# Patient Record
Sex: Male | Born: 1935 | Race: White | Hispanic: No | Marital: Married | Smoking: Former smoker
Health system: Southern US, Community
[De-identification: ages and names within clinical notes are randomized; demographics above are authoritative.]

## PROBLEM LIST (undated history)

## (undated) DIAGNOSIS — I6529 Occlusion and stenosis of unspecified carotid artery: Secondary | ICD-10-CM

## (undated) DIAGNOSIS — Z87442 Personal history of urinary calculi: Secondary | ICD-10-CM

## (undated) DIAGNOSIS — I219 Acute myocardial infarction, unspecified: Secondary | ICD-10-CM

## (undated) DIAGNOSIS — E039 Hypothyroidism, unspecified: Secondary | ICD-10-CM

## (undated) DIAGNOSIS — Z992 Dependence on renal dialysis: Secondary | ICD-10-CM

## (undated) DIAGNOSIS — H9190 Unspecified hearing loss, unspecified ear: Secondary | ICD-10-CM

## (undated) DIAGNOSIS — E079 Disorder of thyroid, unspecified: Secondary | ICD-10-CM

## (undated) DIAGNOSIS — F329 Major depressive disorder, single episode, unspecified: Secondary | ICD-10-CM

## (undated) DIAGNOSIS — I10 Essential (primary) hypertension: Secondary | ICD-10-CM

## (undated) DIAGNOSIS — C801 Malignant (primary) neoplasm, unspecified: Secondary | ICD-10-CM

## (undated) DIAGNOSIS — T7840XA Allergy, unspecified, initial encounter: Secondary | ICD-10-CM

## (undated) DIAGNOSIS — E785 Hyperlipidemia, unspecified: Secondary | ICD-10-CM

## (undated) DIAGNOSIS — N186 End stage renal disease: Secondary | ICD-10-CM

## (undated) DIAGNOSIS — F32A Depression, unspecified: Secondary | ICD-10-CM

## (undated) DIAGNOSIS — I639 Cerebral infarction, unspecified: Secondary | ICD-10-CM

## (undated) DIAGNOSIS — R0600 Dyspnea, unspecified: Secondary | ICD-10-CM

## (undated) DIAGNOSIS — K219 Gastro-esophageal reflux disease without esophagitis: Secondary | ICD-10-CM

## (undated) DIAGNOSIS — D649 Anemia, unspecified: Secondary | ICD-10-CM

## (undated) HISTORY — DX: Acute myocardial infarction, unspecified: I21.9

## (undated) HISTORY — DX: Essential (primary) hypertension: I10

## (undated) HISTORY — PX: ESOPHAGOSCOPY WITH DILITATION: SHX5618

## (undated) HISTORY — DX: Unspecified hearing loss, unspecified ear: H91.90

## (undated) HISTORY — DX: Gastro-esophageal reflux disease without esophagitis: K21.9

## (undated) HISTORY — DX: Allergy, unspecified, initial encounter: T78.40XA

## (undated) HISTORY — PX: COLONOSCOPY: SHX174

## (undated) HISTORY — DX: Occlusion and stenosis of unspecified carotid artery: I65.29

## (undated) HISTORY — PX: ELBOW SURGERY: SHX618

## (undated) HISTORY — PX: NOSE SURGERY: SHX723

## (undated) HISTORY — DX: Hyperlipidemia, unspecified: E78.5

## (undated) HISTORY — PX: OTHER SURGICAL HISTORY: SHX169

## (undated) HISTORY — DX: Disorder of thyroid, unspecified: E07.9

---

## 1998-05-06 ENCOUNTER — Encounter: Payer: Self-pay | Admitting: Emergency Medicine

## 1998-05-06 ENCOUNTER — Emergency Department (HOSPITAL_COMMUNITY): Admission: EM | Admit: 1998-05-06 | Discharge: 1998-05-06 | Payer: Self-pay | Admitting: Emergency Medicine

## 1998-12-11 ENCOUNTER — Ambulatory Visit (HOSPITAL_COMMUNITY): Admission: RE | Admit: 1998-12-11 | Discharge: 1998-12-11 | Payer: Self-pay | Admitting: Internal Medicine

## 1999-01-08 ENCOUNTER — Ambulatory Visit (HOSPITAL_COMMUNITY): Admission: RE | Admit: 1999-01-08 | Discharge: 1999-01-08 | Payer: Self-pay | Admitting: Cardiology

## 1999-01-23 ENCOUNTER — Encounter (HOSPITAL_COMMUNITY): Admission: RE | Admit: 1999-01-23 | Discharge: 1999-04-23 | Payer: Self-pay | Admitting: Cardiology

## 1999-04-24 ENCOUNTER — Encounter (HOSPITAL_COMMUNITY): Admission: RE | Admit: 1999-04-24 | Discharge: 1999-07-23 | Payer: Self-pay | Admitting: Cardiology

## 1999-06-12 ENCOUNTER — Encounter: Admission: RE | Admit: 1999-06-12 | Discharge: 1999-06-12 | Payer: Self-pay | Admitting: Urology

## 1999-06-12 ENCOUNTER — Encounter: Payer: Self-pay | Admitting: Urology

## 2001-11-02 ENCOUNTER — Emergency Department (HOSPITAL_COMMUNITY): Admission: EM | Admit: 2001-11-02 | Discharge: 2001-11-02 | Payer: Self-pay | Admitting: *Deleted

## 2002-01-11 ENCOUNTER — Encounter: Payer: Self-pay | Admitting: Internal Medicine

## 2006-04-14 ENCOUNTER — Ambulatory Visit: Payer: Self-pay | Admitting: Internal Medicine

## 2006-05-14 ENCOUNTER — Encounter (INDEPENDENT_AMBULATORY_CARE_PROVIDER_SITE_OTHER): Payer: Self-pay | Admitting: *Deleted

## 2006-05-14 ENCOUNTER — Ambulatory Visit: Payer: Self-pay | Admitting: Internal Medicine

## 2007-08-16 ENCOUNTER — Encounter: Admission: RE | Admit: 2007-08-16 | Discharge: 2007-08-16 | Payer: Self-pay | Admitting: Otolaryngology

## 2009-02-25 DIAGNOSIS — I219 Acute myocardial infarction, unspecified: Secondary | ICD-10-CM

## 2009-02-25 HISTORY — DX: Acute myocardial infarction, unspecified: I21.9

## 2009-06-19 ENCOUNTER — Observation Stay (HOSPITAL_COMMUNITY): Admission: EM | Admit: 2009-06-19 | Discharge: 2009-06-20 | Payer: Self-pay | Admitting: Emergency Medicine

## 2009-06-19 ENCOUNTER — Ambulatory Visit: Payer: Self-pay | Admitting: Gastroenterology

## 2009-06-20 ENCOUNTER — Encounter: Payer: Self-pay | Admitting: Gastroenterology

## 2009-06-20 ENCOUNTER — Telehealth (INDEPENDENT_AMBULATORY_CARE_PROVIDER_SITE_OTHER): Payer: Self-pay | Admitting: *Deleted

## 2009-06-21 DIAGNOSIS — K222 Esophageal obstruction: Secondary | ICD-10-CM | POA: Insufficient documentation

## 2009-06-29 ENCOUNTER — Telehealth (INDEPENDENT_AMBULATORY_CARE_PROVIDER_SITE_OTHER): Payer: Self-pay | Admitting: *Deleted

## 2009-06-30 ENCOUNTER — Encounter: Payer: Self-pay | Admitting: Internal Medicine

## 2009-06-30 ENCOUNTER — Ambulatory Visit (HOSPITAL_COMMUNITY): Admission: RE | Admit: 2009-06-30 | Discharge: 2009-06-30 | Payer: Self-pay | Admitting: Internal Medicine

## 2009-07-26 HISTORY — PX: CORONARY ANGIOPLASTY WITH STENT PLACEMENT: SHX49

## 2009-08-16 ENCOUNTER — Telehealth: Payer: Self-pay | Admitting: Internal Medicine

## 2009-08-17 ENCOUNTER — Telehealth: Payer: Self-pay | Admitting: Gastroenterology

## 2009-08-18 ENCOUNTER — Ambulatory Visit: Payer: Self-pay | Admitting: Gastroenterology

## 2009-08-18 ENCOUNTER — Inpatient Hospital Stay (HOSPITAL_COMMUNITY): Admission: EM | Admit: 2009-08-18 | Discharge: 2009-08-22 | Payer: Self-pay | Admitting: Emergency Medicine

## 2009-08-18 DIAGNOSIS — Z87442 Personal history of urinary calculi: Secondary | ICD-10-CM

## 2009-08-18 DIAGNOSIS — Z860100 Personal history of colon polyps, unspecified: Secondary | ICD-10-CM | POA: Insufficient documentation

## 2009-08-18 DIAGNOSIS — D649 Anemia, unspecified: Secondary | ICD-10-CM

## 2009-08-18 DIAGNOSIS — Z794 Long term (current) use of insulin: Secondary | ICD-10-CM

## 2009-08-18 DIAGNOSIS — I110 Hypertensive heart disease with heart failure: Secondary | ICD-10-CM

## 2009-08-18 DIAGNOSIS — J301 Allergic rhinitis due to pollen: Secondary | ICD-10-CM

## 2009-08-18 DIAGNOSIS — K219 Gastro-esophageal reflux disease without esophagitis: Secondary | ICD-10-CM | POA: Insufficient documentation

## 2009-08-18 DIAGNOSIS — K449 Diaphragmatic hernia without obstruction or gangrene: Secondary | ICD-10-CM | POA: Insufficient documentation

## 2009-08-18 DIAGNOSIS — E119 Type 2 diabetes mellitus without complications: Secondary | ICD-10-CM | POA: Insufficient documentation

## 2009-08-18 DIAGNOSIS — J390 Retropharyngeal and parapharyngeal abscess: Secondary | ICD-10-CM

## 2009-08-18 DIAGNOSIS — K5730 Diverticulosis of large intestine without perforation or abscess without bleeding: Secondary | ICD-10-CM | POA: Insufficient documentation

## 2009-08-18 DIAGNOSIS — Q2733 Arteriovenous malformation of digestive system vessel: Secondary | ICD-10-CM

## 2009-08-18 DIAGNOSIS — K5520 Angiodysplasia of colon without hemorrhage: Secondary | ICD-10-CM | POA: Insufficient documentation

## 2009-08-18 DIAGNOSIS — R1319 Other dysphagia: Secondary | ICD-10-CM

## 2009-08-19 ENCOUNTER — Other Ambulatory Visit: Payer: Self-pay | Admitting: Gastroenterology

## 2009-08-20 ENCOUNTER — Other Ambulatory Visit: Payer: Self-pay | Admitting: Cardiology

## 2009-08-21 ENCOUNTER — Other Ambulatory Visit: Payer: Self-pay | Admitting: Cardiology

## 2009-08-22 ENCOUNTER — Other Ambulatory Visit: Payer: Self-pay | Admitting: Interventional Cardiology

## 2009-08-22 ENCOUNTER — Other Ambulatory Visit: Payer: Self-pay | Admitting: Cardiology

## 2009-10-12 ENCOUNTER — Encounter (HOSPITAL_COMMUNITY): Admission: RE | Admit: 2009-10-12 | Discharge: 2009-11-24 | Payer: Self-pay | Admitting: Cardiology

## 2009-11-25 ENCOUNTER — Encounter (HOSPITAL_COMMUNITY)
Admission: RE | Admit: 2009-11-25 | Discharge: 2010-02-12 | Payer: Self-pay | Source: Home / Self Care | Attending: Cardiology | Admitting: Cardiology

## 2010-03-27 NOTE — Procedures (Signed)
Summary: Colonoscopy   Colonoscopy  Procedure date:  01/11/2002  Findings:      Location:  [place].    Procedures Next Due Date:    Colonoscopy: 01/2007 Patient Name: Robert [name], Robert [name]. MRN:  Procedure Procedures: Colonoscopy CPT: [phone].  Personnel: Endoscopist: Docia Chuck. Henrene Pastor, MD.  Referred By: Nanine Means Reynaldo Minium, MD.  Exam Location: Exam performed in Outpatient Clinic. Outpatient  Patient Consent: Procedure, Alternatives, Risks and Benefits discussed, consent obtained, from patient. Consent was obtained by the RN.  Indications  Evaluation of: Anemia Normocytic.  Average Risk Screening Routine.  History  Pre-Exam Physical: Performed Jan 11, 2002. Entire physical exam was normal.  Exam Exam: Extent of exam reached: Cecum, extent intended: Cecum.  The cecum was identified by appendiceal orifice and IC valve. Patient position: on left side. Colon retroflexion performed. Images taken. ASA Classification: II. Tolerance: excellent.  Monitoring: Pulse and BP monitoring, Oximetry used. Supplemental O2 given.  Colon Prep Used Golytely for colon prep. Prep results: excellent.  Sedation Meds: Patient assessed and found to be appropriate for moderate (conscious) sedation. Fentanyl 100 mcg. given IV. Versed 7 mg. given IV.  Findings NORMAL EXAM: Cecum to Rectum.  ANGIODYSPLASIA (AVMs): 3 AVMs, maximum size 2 mm, non-bleeding in Cecum. ICD9: Angiodysplasia without Hemorrhage: 569.84.  - DIVERTICULOSIS: Ascending Colon to Sigmoid Colon. ICD9: Diverticulosis, Colon: 562.10.   Assessment Abnormal examination, see findings above.  Diagnoses: 562.10: Diverticulosis, Colon.  569.84: Angiodysplasia without Hemorrhage.   Events  Unplanned Interventions: No intervention was required.  Unplanned Events: There were no complications. Plans Comments: Iron twice daily with meals Disposition: After procedure patient sent to recovery. After recovery patient  sent home.  Scheduling/Referral: Colonoscopy, to Docia Chuck. Henrene Pastor, MD, in 5 years for repeat screening if medically fit,   Comments: Return to the care of Dr. Reynaldo Minium   CC: Richard A. Reynaldo Minium, MD  This report was created from the original endoscopy report, which was reviewed and signed by the above listed endoscopist.

## 2010-03-27 NOTE — Progress Notes (Signed)
Summary: SCHEDULE EGD / DILT  Phone Note Other Incoming   Summary of Call: Call rec'd from Clarise Cruz to schedule pt. for repeat balloon/dil here with Dr.Perry. Pt. was admitted to hospital 06/19/09 and Dr.Kaplan did balloon dil this am. Will contact pt. tomorrow as I scheduled him for dil. at [facility] on 07/12/09-first available and a pre- visit on 07/05/09. Initial call taken by: Abel Presto RN,  June 20, 2009 12:17 PM  Follow-up for Phone Call        I would NOT do his dilation in the [place]. Cancel [facility] and previsit appts. Please set him up for EGD with Savary dilation (w/ C-arm) at hospital next week when I am the hosp doc. Also, pull his old chart for my review. thanks Follow-up by: Irene Shipper MD,  June 20, 2009 4:39 PM  Additional Follow-up for Phone Call Additional follow up Details #1::        Pt. scheduled for EGD/Dil with C-arm at [facility] at 9 am on 06/30/09. Given instructions over the phone. Additional Follow-up by: Abel Presto RN,  June 21, 2009 9:46 AM  New Problems: ESOPHAGEAL STRICTURE (ICD-530.3)   New Problems: ESOPHAGEAL STRICTURE (ICD-530.3)

## 2010-03-27 NOTE — Procedures (Signed)
Summary: Upper Endoscopy  Patient: [name] Note: All result statuses are Final unless otherwise noted.  Tests: (1) Upper Endoscopy (EGD)   EGD Upper Endoscopy       DONE     [facility]     [place], [place]  [id number]           ENDOSCOPY PROCEDURE REPORT           PATIENT:  Robert [name], Robert [name]  MR#:  [id number]     BIRTHDATE:  04-02-1935, 73 yrs. old  GENDER:  male           ENDOSCOPIST:  Docia Chuck. Geri Seminole, MD     Referred by:  .Direct Self,           PROCEDURE DATE:  06/30/2009     PROCEDURE:  EGD with dilatation over guidewire - 18MM SAVARY     FLUOROSCOPIC ASSISTED STRICTURE     DILATION     ASA CLASS:  Class II     INDICATIONS:  dilation of esophageal stricture, dysphagia           MEDICATIONS:   Fentanyl 65 mcg IV, Versed 6.5 mg     TOPICAL ANESTHETIC:  Cetacaine Spray           DESCRIPTION OF PROCEDURE:   After the risks benefits and     alternatives of the procedure were thoroughly explained, informed     consent was obtained.  The Pentax Gastroscope [id number] endoscope     was introduced through the mouth and advanced to the second     portion of the duodenum, without limitations.  The instrument was     slowly withdrawn as the mucosa was fully examined.     <<PROCEDUREIMAGES>>           A 90mm stricture was found in the distal esophagus.  Otherwise the     examination was normal.    Retroflexed views revealed a hiatal     hernia.           THERAPY: SAVARY GUIDEWIRE PLACED IN GASTRIC ANTRUM WITH     FLUOROSCOPIC ASSISTANCE. SCOPE REMOVED AND 18MM SAVARY DILATOR     PASSED OVER GUIDEWIRE WITH FLUOSCOPIC CONTROL. NO RESISTANCE OR     HEME. TOLERATED WELL           COMPLICATIONS:  None           ENDOSCOPIC IMPRESSION:     1) Stricture in the distal esophagus - S/P DILATION 18MM     2) Otherwise normal examination     3) A hiatal hernia     4) Gerd     RECOMMENDATIONS:     1) Clear liquids until 12 NOON, then soft foods rest of  day.     Resume prior diet tomorrow.     2) continue PRILOSEC EVERY DAY     3) follow-up: GI clinic PRN           ______________________________     Docia Chuck. Geri Seminole, MD           CC:  Burnard Bunting, MD, The Patient           n.     eSIGNED:   Docia Chuck. Geri Seminole at 06/30/2009 10:09 AM           Gerald Dexter, [id number]  Note: An exclamation mark (!) indicates a result that was not dispersed into the flowsheet.  Document Creation Date: 06/30/2009 10:10 AM _______________________________________________________________________  (1) Order result status: Final Collection or observation date-time: 06/30/2009 10:01 Requested date-time:  Receipt date-time:  Reported date-time:  Referring Physician:   Ordering Physician: Lavena Bullion [phone]) Specimen Source:  Source: Tawanna Cooler Order Number: [id number] Lab site:

## 2010-03-27 NOTE — Progress Notes (Signed)
  Phone Note Call from Patient   Caller: wife Summary of Call: pt has been having dyphagia intermittently for several days. this morning has felt obstructed, can tolerate liquids even.  I instructed them to go to ER, they will page me.   Initial call taken by: Milus Banister MD,  August 17, 2009 11:19 PM

## 2010-03-27 NOTE — Miscellaneous (Signed)
Summary: hospital admission  NAME:  RUTHIE, PALMESE              ACCOUNT NO.:  [id number]      MEDICAL RECORD NO.:  [id number]          PATIENT TYPE:  INP      LOCATION:  6729                         FACILITY:  [facility]      PHYSICIAN:  Sandy Salaam. Deatra Ina, MD,FACGDATE OF BIRTH:  [birth year]      DATE OF ADMISSION:  06/19/2009   DATE OF DISCHARGE:                                 HISTORY & PHYSICAL      Mr. Betschart is a pleasant 75 year old white male with a history of   hypertension, coronary artery disease, non-insulin-dependent diabetes   mellitus, esophageal stricture admitted with dysphagia and possible food   impaction.  He underwent dilatation for an esophageal stricture about 8   years ago.  He has had occasional dysphagia to solids over the past few   weeks.  Last week, he had 3 days of nausea, vomiting, and diarrhea.   Yesterday, he had severe dysphagia to solids, with accompanying chest   discomfort.  Today, he had difficulty swallowing foods and developed   immediate vomiting of food and liquid accompanied by chest discomfort.   He has occasional pyrosis.  He takes omeprazole.      PAST MEDICAL HISTORY:  Pertinent for hypertension, coronary artery   disease - status post cardiac cath, diabetes, and hypertension.      MEDICATIONS:   1. Actos 45 mg a day.   2. Allegra 180 mg a day.   3. Lisinopril 10 mg daily.   4. Metformin 500 mg b.i.d.   5. Metoprolol ER 50 mg daily.   6. Omeprazole 20 mg a day.   7. Simvastatin 20 mg daily.   8. TriCor 145 mg daily.      He has no allergies.      He does not smoke.  He drinks socially.  He is retired, married.      REVIEW OF SYSTEMS:  Otherwise negative.      PHYSICAL EXAMINATION:  GENERAL:  On exam he is a well-developed, well-   nourished male in no acute distress.   VITAL SIGNS:  Pulse 73, blood pressure 132/53.  He is afebrile.   HEENT:  Within normal limits.  There is no lymphadenopathy.   CHEST:  Clear.   HEART:  There are no  cardiac murmurs, gallops, or rubs.   ABDOMEN:  Without masses, tenderness, or organomegaly.   EXTREMITIES:  There is no cyanosis, clubbing, or edema.   NEUROLOGIC:  There are no sensory or motor deficits.  She is alert and   oriented x3.      IMPRESSION:   1. Dysphagia and odynophagia secondary to recurrent esophageal       stricture.   2. Coronary artery disease - stable.   3. Hypertension.   4. Diabetes.      RECOMMENDATIONS:   1. IV Protonix.   2. Upper endoscopy with dilatation as indicated.               Sandy Salaam. Deatra Ina, MD,FACG  RDK/MEDQ  D:  06/19/2009  T:  06/20/2009  Job:  [id number]      cc:   Dr. Burnard Bunting

## 2010-03-27 NOTE — Procedures (Signed)
Summary: EGD   EGD  Procedure date:  01/11/2002  Findings:      Location: [place]    EGD  Procedure date:  01/11/2002  Findings:      Location: [place]   Patient Name: Robert [name], Robert [name]. MRN:  Procedure Procedures: Panendoscopy (EGD) CPT: [id number].  Personnel: Endoscopist: Docia Chuck. Henrene Pastor, MD.  Referred By: Nanine Means Reynaldo Minium, MD.  Exam Location: Exam performed in Outpatient Clinic. Outpatient  Patient Consent: Procedure, Alternatives, Risks and Benefits discussed, consent obtained, from patient. Consent was obtained by the RN.  Indications  Evaluation of: Anemia,  Normocytic.  Symptoms: Dysphagia. Reflux symptoms  History  Pre-Exam Physical: Performed Jan 11, 2002  Entire physical exam was normal.  Exam Exam Info: Maximum depth of insertion Duodenum, intended Duodenum. Patient position: on left side. Vocal cords visualized. Gastric retroflexion performed. Images taken. ASA Classification: II. Tolerance: excellent.  Sedation Meds: Patient assessed and found to be appropriate for moderate (conscious) sedation. Residual sedation present from prior procedure today.  Monitoring: BP and pulse monitoring done. Oximetry used. Supplemental O2 given  Findings HIATAL HERNIA:  STRICTURE / STENOSIS: Stricture in Distal Esophagus.  Constriction: partial. Etiology: benign due to reflux. 39 cm from mouth. ICD9: Esophageal Stricture: 530.3. Comment: no inflammation or Barrett's esophagus.    Comments: OTHERWISE NORMAL EGD Assessment Normal examination.  Diagnoses: 530.3: Esophageal Stricture.  530.81: GERD.   Events  Unplanned Intervention: No unplanned interventions were required.  Unplanned Events: There were no complications. Plans Medication(s): Continue current medications.  Disposition: After procedure patient sent to recovery. After recovery patient sent home.  Scheduling: Follow-up prn.    CC: Richard A.  Reynaldo Minium, MD  This report was created from the original endoscopy report, which was reviewed and signed by the above listed endoscopist.

## 2010-03-27 NOTE — Progress Notes (Signed)
  Phone Note Call from Patient   Summary of Call: Pt. called re: med. questions prior to dil at hospital tomorrow.Says he takes Lantus Insulin every morning.Instructed to hold it tomorrow am along with diabetic pills. Initial call taken by: Abel Presto RN,  Jun 29, 2009 9:44 AM

## 2010-03-27 NOTE — Progress Notes (Signed)
Summary: Dysphagia / sore throat w/ hoarseness  Phone Note Call from Patient Call back at Home Phone [phone]   Caller: Patient Summary of Call: Patient is having some dysphagia for about two days, his complains of a sore throat mostly on the right side. He is in [place] now not [place] [phone] cell  Initial call taken by: Bernita Buffy CMA Deborra Medina),  August 16, 2009 4:50 PM  Follow-up for Phone Call        pt scheduled to see Dr Henrene Pastor on 08/21/09.  He is very hoarse and having some trouble swallowing.   He is also having trouble with a sore throat.   He was advised to try tylenol for the pain and I would call him back with any further recommendations from Dr Henrene Pastor. Follow-up by: Christian Mate CMA Deborra Medina),  August 16, 2009 5:02 PM  Additional Follow-up for Phone Call Additional follow up Details #1::        Not sure why he has sore throat or hoarseness. make sure he is taking his PPI daily. If so, increase to two times a day and keep OV Additional Follow-up by: Irene Shipper MD,  August 16, 2009 6:42 PM    Additional Follow-up for Phone Call Additional follow up Details #2::    pt aware and is on PPI two times a day   will keep appt Follow-up by: Christian Mate CMA Deborra Medina),  August 17, 2009 7:58 AM

## 2010-03-27 NOTE — Procedures (Signed)
Summary: Upper Endoscopy  Patient: Robert [name] Note: All result statuses are Final unless otherwise noted.  Tests: (1) Upper Endoscopy (EGD)   EGD Upper Endoscopy       [facility]     [place], [place]  [address]           ENDOSCOPY PROCEDURE REPORT           PATIENT:  Robert, [name]  MR#:  [id number]     BIRTHDATE:  1935/05/03, 73 yrs. old  GENDER:  male           ENDOSCOPIST:  Sandy Salaam. Deatra Ina, MD     Referred by:           PROCEDURE DATE:  06/20/2009     PROCEDURE:  EGD with balloon dilatation     ASA CLASS:  Class II     INDICATIONS:  dysphagia           MEDICATIONS:   Fentanyl 50 mcg IV, Versed 5 mg IV, glycopyrrolate     (Robinal) 0.2 mg IV     TOPICAL ANESTHETIC:  Cetacaine Spray           DESCRIPTION OF PROCEDURE:   After the risks benefits and     alternatives of the procedure were thoroughly explained, informed     consent was obtained.  The [id number] endoscope was     introduced through the mouth and advanced to the third portion of     the duodenum, without limitations.  The instrument was slowly     withdrawn as the mucosa was fully examined.     <<PROCEDUREIMAGES>>           A stricture was found at the gastroesophageal junction. Moderately     severe stricture (see image001 and image002). 40mm scope passed     with resistance balloon dilation 11-12 moderate resistance;     moderate heme  A hiatal hernia was found at the gastroesophageal     junction. 3cm sliding hiatal hernia  Otherwise the examination was     normal.    Retroflexed views revealed no abnormalities.    The     scope was then withdrawn from the patient and the procedure     completed.           COMPLICATIONS:  None           ENDOSCOPIC IMPRESSION:     1) Stricture at the gastroesophageal junction - s/p balloon     dilitation     2) Hiatal hernia at the gastroesophageal junction     3) Otherwise normal examination  RECOMMENDATIONS:     1) continue PPI     2) endoscopy with dilitation, approximately 2 weeks, with Dr.     Scarlette Shorts           REPEAT EXAM:  In 2 weeks for EGD/dilitation.           ______________________________     Sandy Salaam Deatra Ina, MD           CC:  Burnard Bunting, MD, Scarlette Shorts, MD           n.     eSIGNED:   Sandy Salaam. Kaplan at 06/20/2009 11:57 AM           Gerald Dexter, [id number]  Note: An exclamation mark (!) indicates a result that was not dispersed into the  flowsheet. Document Creation Date: 06/20/2009 11:58 AM _______________________________________________________________________  (1) Order result status: Final Collection or observation date-time: 06/20/2009 11:50 Requested date-time:  Receipt date-time:  Reported date-time:  Referring Physician:   Ordering Physician: Erskine Emery [phone]) Specimen Source:  Source: Tawanna Cooler Order Number: [id number] Lab site:

## 2010-03-27 NOTE — Procedures (Signed)
Summary: Colonoscopy & Pathology   Colonoscopy  Procedure date:  05/14/2006  Findings:      Location:  [place].    Procedures Next Due Date:    Colonoscopy: 05/2011 Patient Name: Robert [name], Robert [name]. MRN:  Procedure Procedures: Colonoscopy CPT: [phone].    with polypectomy. CPT: [id number].  Personnel: Endoscopist: Docia Chuck. Henrene Pastor, MD.  Exam Location: Exam performed in Outpatient Clinic. Outpatient  Patient Consent: Procedure, Alternatives, Risks and Benefits discussed, consent obtained, from patient. Consent was obtained by the RN.  Indications  Evaluation of: Anemia Normocytic.  History  Current Medications: Patient is not currently taking Coumadin.  Pre-Exam Physical: Performed May 14, 2006. Cardio-pulmonary exam, Rectal exam, HEENT exam , Abdominal exam, Mental status exam WNL.  Comments: Pt. history reviewed/updated, physical exam performed prior to initiation of sedation?yes Exam Exam: Extent of exam reached: Cecum, extent intended: Cecum.  The cecum was identified by appendiceal orifice and IC valve. Patient position: on left side. Time to Cecum: 00:03:25. Time for Withdrawl: 00:10:18. Colon retroflexion performed. Images taken. ASA Classification: II. Tolerance: excellent.  Monitoring: Pulse and BP monitoring, Oximetry used. Supplemental O2 given.  Colon Prep Used Miralax for colon prep. Prep results: excellent.  Sedation Meds: Patient assessed and found to be appropriate for moderate (conscious) sedation. Fentanyl 50 mcg. given IV. Versed 6 mg. given IV.  Findings POLYP: Descending Colon, Maximum size: 3 mm. Procedure:  snare without cautery, removed, retrieved, Polyp sent to pathology. ICD9: Colon Polyps: 211.3.  NORMAL EXAM: Cecum to Rectum.  ANGIODYSPLASIA (AVMs): maximum size 2 mm, non-bleeding in Cecum. ICD9: Angiodysplasia without Hemorrhage: 569.84.  - DIVERTICULOSIS: Cecum to Sigmoid Colon. ICD9: Diverticulosis, Colon: 562.10.  Comments: marked changes throughout.   Assessment  Diagnoses: 562.10: Diverticulosis, Colon.  569.84: Angiodysplasia without Hemorrhage.  211.3: Colon Polyps.   Comments: Though the patient has colonic AVM's, which could lead to GI blood loss and anemia,no evidence of such by iron or stool studies Events  Unplanned Interventions: No intervention was required.  Unplanned Events: There were no complications. Plans Disposition: After procedure patient sent to recovery. After recovery patient sent home.  Scheduling/Referral: Colonoscopy, to Docia Chuck. Henrene Pastor, MD, in 5 years,   Comments: Return to the care of Dr. Reynaldo Minium   CC: Richard A. Reynaldo Minium, MD     The Patient  This report was created from the original endoscopy report, which was reviewed and signed by the above listed endoscopist.     [facility] Surgical Pathology - STATUS: Final             By: Lura Em  ,      Perform Date: [phone] 00:01  Ordered By: Collene Leyden,         Ordered Date: 20Mar08 01:00  Facility: [facility]                               Department: [facility]   [address] [place], [place] [address]   Telephone [phone] or [phone] Fax [phone]    REPORT OF SURGICAL PATHOLOGY    Case #: [id number]   Patient Name: [name], Robert L.   Office Chart Number: [id number]    MRN: [id number]   Pathologist: Colman Cater, M.D.   DOB/Age Mar 01, 1935 (Age: 62) Gender: M   Date Taken: 05/14/2006   Date Received: 05/14/2006    FINAL DIAGNOSIS    ***  MICROSCOPIC EXAMINATION AND DIAGNOSIS***    DESCENDING COLON, BIOPSY: TUBULAR ADENOMA. NO HIGH GRADE   DYSPLASIA OR MALIGNANCY IDENTIFIED.    COMMENT   There is adenomatous epithelium having a predominantly tubular   growth pattern consistent with a tubular adenoma if the biopsy is   representative of the entire lesion. No high grade dysplasia or   evidence of malignancy is identified.  Clinical correlation is   recommended. (MS:kv 05-16-06)    kv   Date Reported: 05/16/2006 Colman Cater, M.D.   *** Electronically Signed Out By MS ***    Clinical information   R/O adenoma (jes)    specimen(s) obtained   Colon, polyp(s), descending    Gross Description   Received in formalin is a tan, soft tissue fragment that is   submitted in toto. Size: 0.4 cm One block   (MA:jes, 05/15/06)    jes/

## 2010-05-09 LAB — GLUCOSE, CAPILLARY: Glucose-Capillary: 161 mg/dL — ABNORMAL HIGH (ref 70–99)

## 2010-05-11 LAB — GLUCOSE, CAPILLARY: Glucose-Capillary: 139 mg/dL — ABNORMAL HIGH (ref 70–99)

## 2010-05-13 LAB — APTT: aPTT: 24 seconds (ref 24–37)

## 2010-05-13 LAB — GLUCOSE, CAPILLARY
Glucose-Capillary: 114 mg/dL — ABNORMAL HIGH (ref 70–99)
Glucose-Capillary: 185 mg/dL — ABNORMAL HIGH (ref 70–99)
Glucose-Capillary: 217 mg/dL — ABNORMAL HIGH (ref 70–99)
Glucose-Capillary: 180 mg/dL — ABNORMAL HIGH (ref 70–99)
Glucose-Capillary: 187 mg/dL — ABNORMAL HIGH (ref 70–99)
Glucose-Capillary: 237 mg/dL — ABNORMAL HIGH (ref 70–99)

## 2010-05-13 LAB — CBC
HCT: 30 % — ABNORMAL LOW (ref 39.0–52.0)
Hemoglobin: 12 g/dL — ABNORMAL LOW (ref 13.0–17.0)
MCH: 29.9 pg (ref 26.0–34.0)
MCH: 30.6 pg (ref 26.0–34.0)
MCH: 31 pg (ref 26.0–34.0)
MCHC: 33.7 g/dL (ref 30.0–36.0)
MCHC: 35.3 g/dL (ref 30.0–36.0)
MCV: 89.3 fL (ref 78.0–100.0)
MCV: 89.5 fL (ref 78.0–100.0)
Platelets: 163 10*3/uL (ref 150–400)
Platelets: 148 10*3/uL — ABNORMAL LOW (ref 150–400)
RBC: 3.21 MIL/uL — ABNORMAL LOW (ref 4.22–5.81)
RBC: 3.87 MIL/uL — ABNORMAL LOW (ref 4.22–5.81)
RDW: 14.1 % (ref 11.5–15.5)
RDW: 14.1 % (ref 11.5–15.5)
RDW: 14.7 % (ref 11.5–15.5)
WBC: 4.7 10*3/uL (ref 4.0–10.5)

## 2010-05-13 LAB — POCT I-STAT, CHEM 8
Calcium, Ion: 1.18 mmol/L (ref 1.12–1.32)
Creatinine, Ser: 1.3 mg/dL (ref 0.4–1.5)
Glucose, Bld: 121 mg/dL — ABNORMAL HIGH (ref 70–99)
HCT: 34 % — ABNORMAL LOW (ref 39.0–52.0)
Sodium: 142 mEq/L (ref 135–145)
TCO2: 25 mmol/L (ref 0–100)

## 2010-05-13 LAB — BASIC METABOLIC PANEL
BUN: 15 mg/dL (ref 6–23)
BUN: 16 mg/dL (ref 6–23)
Calcium: 8.3 mg/dL — ABNORMAL LOW (ref 8.4–10.5)
Calcium: 8.4 mg/dL (ref 8.4–10.5)
Calcium: 8.4 mg/dL (ref 8.4–10.5)
Chloride: 108 mEq/L (ref 96–112)
GFR calc Af Amer: >60 mL/min (ref >60)
GFR calc Af Amer: >60 mL/min (ref >60)
GFR calc non Af Amer: 54 mL/min — ABNORMAL LOW (ref >60)
Potassium: 3.8 mEq/L (ref 3.5–5.1)
Potassium: 3.9 mEq/L (ref 3.5–5.1)
Sodium: 138 mEq/L (ref 135–145)
Sodium: 139 mEq/L (ref 135–145)

## 2010-05-13 LAB — CK TOTAL AND CKMB (NOT AT ARMC)
CK, MB: 9 ng/mL (ref 0.3–4.0)
Relative Index: 6.5 — ABNORMAL HIGH (ref 0.0–2.5)
Total CK: 138 U/L (ref 7–232)

## 2010-05-13 LAB — CARDIAC PANEL(CRET KIN+CKTOT+MB+TROPI)
CK, MB: 81.7 ng/mL (ref 0.3–4.0)
Relative Index: 16 — ABNORMAL HIGH (ref 0.0–2.5)
Total CK: 102 U/L (ref 7–232)
Total CK: 512 U/L — ABNORMAL HIGH (ref 7–232)
Troponin I: 4.2 ng/mL (ref 0.00–0.06)
Troponin I: 15.81 ng/mL (ref 0.00–0.06)

## 2010-05-13 LAB — COMPREHENSIVE METABOLIC PANEL
AST: 36 U/L (ref 0–37)
Albumin: 2.6 g/dL — ABNORMAL LOW (ref 3.5–5.2)
CO2: 26 mEq/L (ref 19–32)
Calcium: 8.5 mg/dL (ref 8.4–10.5)
Creatinine, Ser: 1.26 mg/dL (ref 0.4–1.5)
GFR calc Af Amer: >60 mL/min (ref >60)
GFR calc non Af Amer: 56 mL/min — ABNORMAL LOW (ref >60)
Glucose, Bld: 137 mg/dL — ABNORMAL HIGH (ref 70–99)
Potassium: 3.7 mEq/L (ref 3.5–5.1)
Sodium: 143 mEq/L (ref 135–145)

## 2010-05-13 LAB — DIFFERENTIAL
Eosinophils Absolute: 0.1 10*3/uL (ref 0.0–0.7)
Lymphocytes Relative: 14 % (ref 12–46)
Lymphs Abs: 1 10*3/uL (ref 0.7–4.0)
Monocytes Absolute: 0.6 10*3/uL (ref 0.1–1.0)
Neutrophils Relative %: 75 % (ref 43–77)

## 2010-05-13 LAB — BRAIN NATRIURETIC PEPTIDE: Pro B Natriuretic peptide (BNP): 232 pg/mL — ABNORMAL HIGH (ref 0.0–100.0)

## 2010-05-13 LAB — LIPID PANEL
Cholesterol: 157 mg/dL (ref 0–200)
VLDL: 40 mg/dL (ref 0–40)

## 2010-05-13 LAB — MRSA PCR SCREENING: MRSA by PCR: NEGATIVE

## 2010-05-13 LAB — HEPARIN LEVEL (UNFRACTIONATED): Heparin Unfractionated: 0.25 IU/mL — ABNORMAL LOW (ref 0.30–0.70)

## 2010-05-15 LAB — COMPREHENSIVE METABOLIC PANEL
ALT: 19 U/L (ref 0–53)
AST: 24 U/L (ref 0–37)
Albumin: 3.3 g/dL — ABNORMAL LOW (ref 3.5–5.2)
Alkaline Phosphatase: 43 U/L (ref 39–117)
Calcium: 8.3 mg/dL — ABNORMAL LOW (ref 8.4–10.5)
GFR calc Af Amer: >60 mL/min (ref >60)
Potassium: 4.2 mEq/L (ref 3.5–5.1)
Sodium: 143 mEq/L (ref 135–145)
Total Protein: 6 g/dL (ref 6.0–8.3)

## 2010-05-15 LAB — DIFFERENTIAL
Basophils Relative: 0 % (ref 0–1)
Eosinophils Absolute: 0 10*3/uL (ref 0.0–0.7)
Eosinophils Relative: 0 % (ref 0–5)
Lymphs Abs: 0.9 10*3/uL (ref 0.7–4.0)
Monocytes Absolute: 0.1 10*3/uL (ref 0.1–1.0)
Monocytes Relative: 1 % — ABNORMAL LOW (ref 3–12)

## 2010-05-15 LAB — URINALYSIS, ROUTINE W REFLEX MICROSCOPIC
Bilirubin Urine: NEGATIVE
Ketones, ur: NEGATIVE mg/dL
Leukocytes, UA: NEGATIVE
Nitrite: NEGATIVE
Protein, ur: 100 mg/dL — AB
pH: 5 (ref 5.0–8.0)

## 2010-05-15 LAB — URINE MICROSCOPIC-ADD ON

## 2010-05-15 LAB — BASIC METABOLIC PANEL
Calcium: 8.3 mg/dL — ABNORMAL LOW (ref 8.4–10.5)
GFR calc Af Amer: >60 mL/min (ref >60)
GFR calc non Af Amer: >60 mL/min (ref >60)
Glucose, Bld: 116 mg/dL — ABNORMAL HIGH (ref 70–99)
Sodium: 144 mEq/L (ref 135–145)

## 2010-05-15 LAB — CK TOTAL AND CKMB (NOT AT ARMC)
CK, MB: 3.2 ng/mL (ref 0.3–4.0)
Total CK: 124 U/L (ref 7–232)

## 2010-05-15 LAB — GLUCOSE, CAPILLARY
Glucose-Capillary: 108 mg/dL — ABNORMAL HIGH (ref 70–99)
Glucose-Capillary: 104 mg/dL — ABNORMAL HIGH (ref 70–99)
Glucose-Capillary: 124 mg/dL — ABNORMAL HIGH (ref 70–99)
Glucose-Capillary: 86 mg/dL (ref 70–99)

## 2010-05-15 LAB — CBC
Hemoglobin: 12.7 g/dL — ABNORMAL LOW (ref 13.0–17.0)
RBC: 4.12 MIL/uL — ABNORMAL LOW (ref 4.22–5.81)
WBC: 8.3 10*3/uL (ref 4.0–10.5)

## 2010-07-13 NOTE — Assessment & Plan Note (Signed)
[facility] OFFICE NOTE   Robert [name], Robert [name]                     MRN:          [id number]  DATE:04/14/2006                            DOB:          February 27, 1935    REASON FOR CONSULTATION:  Anemia, requests colonoscopy.   HISTORY:  This is a pleasant 75 year old white male with a history of  hypertension, diabetes mellitus, gastroesophageal reflux disease, renal  nephrolithiasis, and anemia who is referred through the courtesy of Dr.  Reynaldo Minium regarding anemia and followup colonoscopy.  The patient was  evaluated in October 2003 regarding anemia, screening colonoscopy, and  reflux disease.  See that dictation for details.  At that time his  hemoglobin level was 11.3 with an MCV of 87.  On January 11, 2002, the  patient underwent both upper endoscopy and colonoscopy.  Upper endoscopy  revealed a benign stricture of the distal esophagus as well as a small  hiatal hernia.  No other abnormalities.  Colonoscopy revealed moderate  diverticulosis as well as cecal vascular malformations.  Followup  colonoscopy in 5 years recommended.  The patient reports recently  undergoing routine evaluation with Dr. Reynaldo Minium.  He was found to be  anemic with a hemoglobin of 11.8.  His MCV was normal at 86.8.  Platelets and white blood cell count were also normal.  He submitted  hemoccult testing in the form of Hemasure.  This was negative.  The  patient is maintained chronically on Prilosec 20 mg daily for his  reflux.  On the medication he has no problems with heartburn,  indigestion, dysphagia or abdominal pain.  He has had no alteration in  his bowel habits.  No melena or hematochezia.  His appetite and weight  have been stable.   PAST MEDICAL HISTORY:  As above.   PAST SURGICAL HISTORY:  Elbow surgery.   ALLERGIES:  No known drug allergies.   CURRENT MEDICATIONS:  1. Lisinopril 10 mg daily.  2. Metformin 1000 mg  daily.  3. TriCor 145 mg daily.  4. Toprol-XL 50 mg daily.  5. Allegra 180 mg daily.  6. Omeprazole 20 mg daily.  7. Lipitor 20 mg daily.  8. Actos 45 mg daily.  9. Enteric-coated aspirin 365 mg daily.  10.Unspecific antioxidant once daily.  11.NPH Lantus insulin 15 units subcu q.a.m.   FAMILY HISTORY:  Negative for gastrointestinal malignancy.   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  He does not smoke,  occasionally uses alcohol.   REVIEW OF SYSTEMS:  Per diagnostic evaluation form.   PHYSICAL EXAMINATION:  GENERAL:  Well-appearing man in no acute  distress.  VITAL SIGNS:  Blood pressure is 136/58, heart rate is 88, weight is  194.2 pounds.  He is 5 feet 8 inches in height.  HEENT:  Sclerae anicteric, conjunctivae are pink, oral mucosa intact, no  adenopathy.  LUNGS:  Clear.  HEART:  Regular.  ABDOMEN:  Soft without tenderness, mass or hernia.  Good bowel sounds  heard.  EXTREMITIES:  Without edema.  IMPRESSION:  1. Normocytic anemia with negative gastrointestinal review of systems      and hemoccult negative stool.  It is not clear that the patient's      anemia is secondary to GI blood loss.  However, if it were, the      most likely candidate would be intermittent bleeding from      previously identified vascular malformations of the colon.  2. History of reflux disease with incidental esophageal stricture on      upper endoscopy.  Currently asymptomatic, on Prilosec.  3. Multiple general medical problems.   RECOMMENDATIONS:  1. B12, folate, and iron studies to further work up normocytic anemia.  2. Schedule followup colonoscopy to evaluate anemia, assess his      vascular malformations, and provide screening as already previously      recommended for this year.  The nature of the procedure as well as      the risks, benefits, and alternatives have been reviewed.  He      understood  and agreed to proceed.  In addition, we will recommend      holding his oral diabetic agents the day of his exam, as well as      holding his morning insulin.     Docia Chuck. Henrene Pastor, MD  Electronically Signed    JNP/MedQ  DD: 04/14/2006  DT: 04/14/2006  Job #: [id number]   cc:   Burnard Bunting, M.D.

## 2011-03-29 ENCOUNTER — Encounter: Payer: Self-pay | Admitting: Internal Medicine

## 2011-05-01 ENCOUNTER — Encounter: Payer: Self-pay | Admitting: Internal Medicine

## 2011-06-06 ENCOUNTER — Encounter: Payer: Self-pay | Admitting: Internal Medicine

## 2011-06-06 ENCOUNTER — Ambulatory Visit (AMBULATORY_SURGERY_CENTER): Payer: Medicare Other | Admitting: *Deleted

## 2011-06-06 VITALS — Ht 68.0 in | Wt 215.0 lb

## 2011-06-06 DIAGNOSIS — Z1211 Encounter for screening for malignant neoplasm of colon: Secondary | ICD-10-CM

## 2011-06-06 MED ORDER — PEG-KCL-NACL-NASULF-NA ASC-C 100 G PO SOLR: ORAL | Status: DC

## 2011-06-20 ENCOUNTER — Ambulatory Visit (AMBULATORY_SURGERY_CENTER): Payer: Medicare Other | Admitting: Internal Medicine

## 2011-06-20 ENCOUNTER — Encounter: Payer: Self-pay | Admitting: Internal Medicine

## 2011-06-20 VITALS — BP 170/72 | HR 72 | Temp 95.9°F | Resp 20 | Ht 68.0 in | Wt 215.0 lb

## 2011-06-20 DIAGNOSIS — Z1211 Encounter for screening for malignant neoplasm of colon: Secondary | ICD-10-CM

## 2011-06-20 DIAGNOSIS — D126 Benign neoplasm of colon, unspecified: Secondary | ICD-10-CM

## 2011-06-20 DIAGNOSIS — Z86010 Personal history of colonic polyps: Secondary | ICD-10-CM

## 2011-06-20 DIAGNOSIS — K5520 Angiodysplasia of colon without hemorrhage: Secondary | ICD-10-CM

## 2011-06-20 MED ORDER — SODIUM CHLORIDE 0.9 % IV SOLN: 500.0000 mL | INTRAVENOUS | Status: DC

## 2011-06-20 NOTE — Op Note (Signed)
[facility] [address] [place], [place]  [address]  COLONOSCOPY PROCEDURE REPORT  PATIENT:  Robert [name], Robert [name]  MR#:  [id number] BIRTHDATE:  01-Jan-1936, 75 yrs. old  GENDER:  male ENDOSCOPIST:  Docia Chuck. Geri Seminole, MD REF. BY:  Surveillance Program Recall, PROCEDURE DATE:  06/20/2011 PROCEDURE:  Colonoscopy with snare polypectomy x 1 ASA CLASS:  Class II INDICATIONS:  history of pre-cancerous (adenomatous) colon polyps, surveillance and high-risk screening ; index 04-2006 w/ TA MEDICATIONS:   MAC sedation, administered by CRNA, propofol (Diprivan) 110 mg IV  DESCRIPTION OF PROCEDURE:   After the risks benefits and alternatives of the procedure were thoroughly explained, informed consent was obtained.  Digital rectal exam was performed and revealed no abnormalities.   The LB CF-[id number] endoscope was introduced through the anus and advanced to the cecum, which was identified by both the appendix and ileocecal valve, without limitations.  The quality of the prep was excellent, using MoviPrep.  The instrument was then slowly withdrawn as the colon was fully examined. <<PROCEDUREIMAGES>>  FINDINGS:  A diminutive polyp was found in the cecum and snared without cautery. Retrieval was successful.  An A.V. malformation was found in the cecum.  Severe diverticulosis was found throughout the colon.   Retroflexed views in the rectum revealed internal hemorrhoids.    The time to cecum =  3:33  minutes. The scope was then withdrawn in 7:06  minutes from the cecum and the procedure completed.  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 Geri Seminole, MD  CC:  Burnard Bunting, MD;  The Patient  n. eSIGNED:   Docia Chuck. Geri Seminole at  06/20/2011 11:24 AM  Gerald Dexter, [id number]

## 2011-06-20 NOTE — Progress Notes (Signed)
Patient did not experience any of the following events: a burn prior to discharge; a fall within the facility; wrong site/side/patient/procedure/implant event; or a hospital transfer or hospital admission upon discharge from the facility. (G8907) Patient did not have preoperative order for IV antibiotic SSI prophylaxis. (G8918)  

## 2011-06-20 NOTE — Progress Notes (Signed)
Propofol administered by S Camp CRNA 

## 2011-06-20 NOTE — Patient Instructions (Signed)
Discharge instructions given with verbal understanding. Handouts on polyps,diverticulosis, and a high fiber diet given. Resume previous medications.YOU HAD AN ENDOSCOPIC PROCEDURE TODAY AT [facility]: Refer to the procedure report that was given to you for any specific questions about what was found during the examination.  If the procedure report does not answer your questions, please call your gastroenterologist to clarify.  If you requested that your care partner not be given the details of your procedure findings, then the procedure report has been included in a sealed envelope for you to review at your convenience later.  YOU SHOULD EXPECT: Some feelings of bloating in the abdomen. Passage of more gas than usual.  Walking can help get rid of the air that was put into your GI tract during the procedure and reduce the bloating. If you had a lower endoscopy (such as a colonoscopy or flexible sigmoidoscopy) you may notice spotting of blood in your stool or on the toilet paper. If you underwent a bowel prep for your procedure, then you may not have a normal bowel movement for a few days.  DIET: Your first meal following the procedure should be a light meal and then it is ok to progress to your normal diet.  A half-sandwich or bowl of soup is an example of a good first meal.  Heavy or fried foods are harder to digest and may make you feel nauseous or bloated.  Likewise meals heavy in dairy and vegetables can cause extra gas to form and this can also increase the bloating.  Drink plenty of fluids but you should avoid alcoholic beverages for 24 hours.  ACTIVITY: Your care partner should take you home directly after the procedure.  You should plan to take it easy, moving slowly for the rest of the day.  You can resume normal activity the day after the procedure however you should NOT DRIVE or use heavy machinery for 24 hours (because of the sedation medicines used during the test).     SYMPTOMS TO REPORT IMMEDIATELY: A gastroenterologist can be reached at any hour.  During normal business hours, 8:30 AM to 5:00 PM Monday through Friday, call [phone].  After hours and on weekends, please call the GI answering service at [phone] who will take a message and have the physician on call contact you.   Following lower endoscopy (colonoscopy or flexible sigmoidoscopy):  Excessive amounts of blood in the stool  Significant tenderness or worsening of abdominal pains  Swelling of the abdomen that is new, acute  Fever of 100F or higher  FOLLOW UP: If any biopsies were taken you will be contacted by phone or by letter within the next 1-3 weeks.  Call your gastroenterologist if you have not heard about the biopsies in 3 weeks.  Our staff will call the home number listed on your records the next business day following your procedure to check on you and address any questions or concerns that you may have at that time regarding the information given to you following your procedure. This is a courtesy call and so if there is no answer at the home number and we have not heard from you through the emergency physician on call, we will assume that you have returned to your regular daily activities without incident.  SIGNATURES/CONFIDENTIALITY: You and/or your care partner have signed paperwork which will be entered into your electronic medical record.  These signatures attest to the fact that that the information above on your  After Visit Summary has been reviewed and is understood.  Full responsibility of the confidentiality of this discharge information lies with you and/or your care-partner.

## 2011-06-21 ENCOUNTER — Telehealth: Payer: Self-pay

## 2011-06-21 NOTE — Telephone Encounter (Signed)
  Follow up Call-  Call back number 06/20/2011  Post procedure Call Back phone  # [phone]  Permission to leave phone message Yes     Patient questions:  Do you have a fever, pain , or abdominal swelling? no Pain Score  0 *  Have you tolerated food without any problems? yes  Have you been able to return to your normal activities? yes  Do you have any questions about your discharge instructions: Diet   no Medications  no Follow up visit  no  Do you have questions or concerns about your Care? no  Actions: * If pain score is 4 or above: No action needed, pain <4.

## 2011-06-25 ENCOUNTER — Encounter: Payer: Self-pay | Admitting: Internal Medicine

## 2012-06-15 ENCOUNTER — Other Ambulatory Visit: Payer: Self-pay | Admitting: Cardiology

## 2012-06-15 ENCOUNTER — Ambulatory Visit
Admission: RE | Admit: 2012-06-15 | Discharge: 2012-06-15 | Disposition: A | Discharge status: Home / Self Care | Payer: Medicare Other | Source: Ambulatory Visit | Attending: Cardiology | Admitting: Cardiology

## 2012-06-15 DIAGNOSIS — R0602 Shortness of breath: Secondary | ICD-10-CM

## 2012-06-18 ENCOUNTER — Encounter (INDEPENDENT_AMBULATORY_CARE_PROVIDER_SITE_OTHER): Payer: Medicare Other | Admitting: Vascular Surgery

## 2012-06-18 DIAGNOSIS — R609 Edema, unspecified: Secondary | ICD-10-CM

## 2012-06-18 DIAGNOSIS — R0602 Shortness of breath: Secondary | ICD-10-CM

## 2012-06-19 ENCOUNTER — Encounter: Payer: Self-pay | Admitting: Cardiology

## 2012-11-12 ENCOUNTER — Other Ambulatory Visit (HOSPITAL_COMMUNITY): Payer: Self-pay | Admitting: Urology

## 2012-11-12 DIAGNOSIS — C61 Malignant neoplasm of prostate: Secondary | ICD-10-CM

## 2012-11-26 ENCOUNTER — Encounter (HOSPITAL_COMMUNITY)
Admission: RE | Admit: 2012-11-26 | Discharge: 2012-11-26 | Disposition: A | Discharge status: Home / Self Care | Payer: Medicare Other | Source: Ambulatory Visit | Attending: Urology | Admitting: Urology

## 2012-11-26 DIAGNOSIS — C61 Malignant neoplasm of prostate: Secondary | ICD-10-CM | POA: Insufficient documentation

## 2012-11-26 MED ORDER — TECHNETIUM TC 99M MEDRONATE IV KIT
26.1000 | PACK | Freq: Once | INTRAVENOUS | Status: AC | PRN
  Administered 2012-11-26: 26.1 via INTRAVENOUS

## 2013-09-21 ENCOUNTER — Other Ambulatory Visit (HOSPITAL_COMMUNITY): Payer: Self-pay | Admitting: *Deleted

## 2013-09-22 ENCOUNTER — Ambulatory Visit (HOSPITAL_COMMUNITY)
Admission: RE | Admit: 2013-09-22 | Discharge: 2013-09-22 | Disposition: A | Discharge status: Home / Self Care | Payer: Medicare Other | Source: Ambulatory Visit | Attending: Nephrology | Admitting: Nephrology

## 2013-09-22 DIAGNOSIS — D509 Iron deficiency anemia, unspecified: Secondary | ICD-10-CM | POA: Diagnosis not present

## 2013-09-22 MED ORDER — SODIUM CHLORIDE 0.9 % IV SOLN
1020.0000 mg | Freq: Once | INTRAVENOUS | Status: AC
  Administered 2013-09-22: 1020 mg via INTRAVENOUS
  Filled 2013-09-22: qty 34

## 2013-11-11 ENCOUNTER — Encounter: Payer: Self-pay | Admitting: Internal Medicine

## 2015-05-13 ENCOUNTER — Encounter (HOSPITAL_COMMUNITY): Payer: Self-pay | Admitting: *Deleted

## 2015-05-13 ENCOUNTER — Inpatient Hospital Stay (HOSPITAL_COMMUNITY): Payer: Medicare Other

## 2015-05-13 ENCOUNTER — Emergency Department (HOSPITAL_COMMUNITY): Payer: Medicare Other

## 2015-05-13 ENCOUNTER — Inpatient Hospital Stay (HOSPITAL_COMMUNITY)
Admission: EM | Admit: 2015-05-13 | Discharge: 2015-05-17 | DRG: 040 | Disposition: A | Discharge status: Home Health | Payer: Medicare Other | Attending: Family Medicine | Admitting: Family Medicine

## 2015-05-13 DIAGNOSIS — I472 Ventricular tachycardia: Secondary | ICD-10-CM | POA: Diagnosis present

## 2015-05-13 DIAGNOSIS — I160 Hypertensive urgency: Secondary | ICD-10-CM | POA: Diagnosis present

## 2015-05-13 DIAGNOSIS — G9340 Encephalopathy, unspecified: Secondary | ICD-10-CM | POA: Diagnosis not present

## 2015-05-13 DIAGNOSIS — Z87891 Personal history of nicotine dependence: Secondary | ICD-10-CM

## 2015-05-13 DIAGNOSIS — Z794 Long term (current) use of insulin: Secondary | ICD-10-CM | POA: Diagnosis not present

## 2015-05-13 DIAGNOSIS — I340 Nonrheumatic mitral (valve) insufficiency: Secondary | ICD-10-CM | POA: Diagnosis not present

## 2015-05-13 DIAGNOSIS — R4701 Aphasia: Secondary | ICD-10-CM | POA: Diagnosis not present

## 2015-05-13 DIAGNOSIS — D649 Anemia, unspecified: Secondary | ICD-10-CM | POA: Diagnosis present

## 2015-05-13 DIAGNOSIS — I63512 Cerebral infarction due to unspecified occlusion or stenosis of left middle cerebral artery: Secondary | ICD-10-CM | POA: Diagnosis not present

## 2015-05-13 DIAGNOSIS — D696 Thrombocytopenia, unspecified: Secondary | ICD-10-CM | POA: Diagnosis present

## 2015-05-13 DIAGNOSIS — N189 Chronic kidney disease, unspecified: Secondary | ICD-10-CM | POA: Diagnosis present

## 2015-05-13 DIAGNOSIS — I639 Cerebral infarction, unspecified: Secondary | ICD-10-CM

## 2015-05-13 DIAGNOSIS — E1122 Type 2 diabetes mellitus with diabetic chronic kidney disease: Secondary | ICD-10-CM | POA: Diagnosis present

## 2015-05-13 DIAGNOSIS — Z7984 Long term (current) use of oral hypoglycemic drugs: Secondary | ICD-10-CM | POA: Diagnosis not present

## 2015-05-13 DIAGNOSIS — Z79899 Other long term (current) drug therapy: Secondary | ICD-10-CM | POA: Diagnosis not present

## 2015-05-13 DIAGNOSIS — E039 Hypothyroidism, unspecified: Secondary | ICD-10-CM | POA: Diagnosis present

## 2015-05-13 DIAGNOSIS — H269 Unspecified cataract: Secondary | ICD-10-CM | POA: Diagnosis present

## 2015-05-13 DIAGNOSIS — I63411 Cerebral infarction due to embolism of right middle cerebral artery: Principal | ICD-10-CM | POA: Diagnosis present

## 2015-05-13 DIAGNOSIS — I672 Cerebral atherosclerosis: Secondary | ICD-10-CM | POA: Diagnosis present

## 2015-05-13 DIAGNOSIS — I6789 Other cerebrovascular disease: Secondary | ICD-10-CM | POA: Diagnosis not present

## 2015-05-13 DIAGNOSIS — Z7982 Long term (current) use of aspirin: Secondary | ICD-10-CM | POA: Diagnosis not present

## 2015-05-13 DIAGNOSIS — E1165 Type 2 diabetes mellitus with hyperglycemia: Secondary | ICD-10-CM

## 2015-05-13 DIAGNOSIS — I252 Old myocardial infarction: Secondary | ICD-10-CM

## 2015-05-13 DIAGNOSIS — R4182 Altered mental status, unspecified: Secondary | ICD-10-CM

## 2015-05-13 DIAGNOSIS — Q211 Atrial septal defect: Secondary | ICD-10-CM

## 2015-05-13 DIAGNOSIS — K219 Gastro-esophageal reflux disease without esophagitis: Secondary | ICD-10-CM | POA: Diagnosis present

## 2015-05-13 DIAGNOSIS — I2510 Atherosclerotic heart disease of native coronary artery without angina pectoris: Secondary | ICD-10-CM | POA: Diagnosis present

## 2015-05-13 DIAGNOSIS — I63511 Cerebral infarction due to unspecified occlusion or stenosis of right middle cerebral artery: Secondary | ICD-10-CM | POA: Diagnosis not present

## 2015-05-13 DIAGNOSIS — I129 Hypertensive chronic kidney disease with stage 1 through stage 4 chronic kidney disease, or unspecified chronic kidney disease: Secondary | ICD-10-CM | POA: Diagnosis present

## 2015-05-13 DIAGNOSIS — Z955 Presence of coronary angioplasty implant and graft: Secondary | ICD-10-CM | POA: Diagnosis not present

## 2015-05-13 DIAGNOSIS — E785 Hyperlipidemia, unspecified: Secondary | ICD-10-CM | POA: Diagnosis present

## 2015-05-13 DIAGNOSIS — N179 Acute kidney failure, unspecified: Secondary | ICD-10-CM | POA: Diagnosis present

## 2015-05-13 DIAGNOSIS — E781 Pure hyperglyceridemia: Secondary | ICD-10-CM | POA: Diagnosis present

## 2015-05-13 DIAGNOSIS — R410 Disorientation, unspecified: Secondary | ICD-10-CM | POA: Diagnosis present

## 2015-05-13 DIAGNOSIS — E119 Type 2 diabetes mellitus without complications: Secondary | ICD-10-CM

## 2015-05-13 DIAGNOSIS — E1121 Type 2 diabetes mellitus with diabetic nephropathy: Secondary | ICD-10-CM | POA: Diagnosis present

## 2015-05-13 DIAGNOSIS — Z8673 Personal history of transient ischemic attack (TIA), and cerebral infarction without residual deficits: Secondary | ICD-10-CM | POA: Diagnosis present

## 2015-05-13 HISTORY — DX: Cerebral infarction, unspecified: I63.9

## 2015-05-13 LAB — RETICULOCYTES
RBC.: 4.24 MIL/uL (ref 4.22–5.81)
RETIC COUNT ABSOLUTE: 46.6 10*3/uL (ref 19.0–186.0)
RETIC CT PCT: 1.1 % (ref 0.4–3.1)

## 2015-05-13 LAB — RAPID URINE DRUG SCREEN, HOSP PERFORMED
Amphetamines: NOT DETECTED
BARBITURATES: NOT DETECTED
Benzodiazepines: NOT DETECTED
Cocaine: NOT DETECTED
Opiates: NOT DETECTED
TETRAHYDROCANNABINOL: NOT DETECTED

## 2015-05-13 LAB — URINALYSIS, ROUTINE W REFLEX MICROSCOPIC
Bilirubin Urine: NEGATIVE
Glucose, UA: 500 mg/dL — AB
KETONES UR: NEGATIVE mg/dL
LEUKOCYTES UA: NEGATIVE
NITRITE: NEGATIVE
Specific Gravity, Urine: 1.018 (ref 1.005–1.030)
pH: 7 (ref 5.0–8.0)

## 2015-05-13 LAB — CBC
HEMATOCRIT: 35.6 % — AB (ref 39.0–52.0)
HEMOGLOBIN: 11.1 g/dL — AB (ref 13.0–17.0)
MCH: 27 pg (ref 26.0–34.0)
MCHC: 31.2 g/dL (ref 30.0–36.0)
MCV: 86.6 fL (ref 78.0–100.0)
Platelets: 140 10*3/uL — ABNORMAL LOW (ref 150–400)
RBC: 4.11 MIL/uL — AB (ref 4.22–5.81)
RDW: 15 % (ref 11.5–15.5)
WBC: 4.9 10*3/uL (ref 4.0–10.5)

## 2015-05-13 LAB — COMPREHENSIVE METABOLIC PANEL
ALBUMIN: 2.6 g/dL — AB (ref 3.5–5.0)
ALK PHOS: 86 U/L (ref 38–126)
ALT: 15 U/L — ABNORMAL LOW (ref 17–63)
ANION GAP: 10 (ref 5–15)
AST: 21 U/L (ref 15–41)
BILIRUBIN TOTAL: 0.5 mg/dL (ref 0.3–1.2)
BUN: 31 mg/dL — AB (ref 6–20)
CALCIUM: 8.6 mg/dL — AB (ref 8.9–10.3)
CO2: 22 mmol/L (ref 22–32)
Chloride: 109 mmol/L (ref 101–111)
Creatinine, Ser: 2.6 mg/dL — ABNORMAL HIGH (ref 0.61–1.24)
GFR calc Af Amer: 26 mL/min — ABNORMAL LOW (ref >60)
GFR, EST NON AFRICAN AMERICAN: 22 mL/min — AB (ref >60)
GLUCOSE: 272 mg/dL — AB (ref 65–99)
Potassium: 4.7 mmol/L (ref 3.5–5.1)
Sodium: 141 mmol/L (ref 135–145)
TOTAL PROTEIN: 5.7 g/dL — AB (ref 6.5–8.1)

## 2015-05-13 LAB — DIFFERENTIAL
Basophils Absolute: 0 10*3/uL (ref 0.0–0.1)
Basophils Relative: 1 %
EOS PCT: 2 %
Eosinophils Absolute: 0.1 10*3/uL (ref 0.0–0.7)
LYMPHS ABS: 0.9 10*3/uL (ref 0.7–4.0)
LYMPHS PCT: 17 %
MONOS PCT: 7 %
Monocytes Absolute: 0.3 10*3/uL (ref 0.1–1.0)
NEUTROS PCT: 73 %
Neutro Abs: 3.6 10*3/uL (ref 1.7–7.7)

## 2015-05-13 LAB — I-STAT CHEM 8, ED
BUN: 32 mg/dL — AB (ref 6–20)
CALCIUM ION: 1.11 mmol/L — AB (ref 1.13–1.30)
CREATININE: 2.6 mg/dL — AB (ref 0.61–1.24)
Chloride: 109 mmol/L (ref 101–111)
GLUCOSE: 262 mg/dL — AB (ref 65–99)
HEMATOCRIT: 36 % — AB (ref 39.0–52.0)
HEMOGLOBIN: 12.2 g/dL — AB (ref 13.0–17.0)
Potassium: 4.6 mmol/L (ref 3.5–5.1)
Sodium: 141 mmol/L (ref 135–145)
TCO2: 21 mmol/L (ref 0–100)

## 2015-05-13 LAB — I-STAT TROPONIN, ED: TROPONIN I, POC: 0.04 ng/mL (ref 0.00–0.08)

## 2015-05-13 LAB — APTT: aPTT: 25 seconds (ref 24–37)

## 2015-05-13 LAB — URINE MICROSCOPIC-ADD ON

## 2015-05-13 LAB — PROTIME-INR
INR: 0.98 (ref 0.00–1.49)
Prothrombin Time: 13.2 seconds (ref 11.6–15.2)

## 2015-05-13 LAB — ETHANOL: Alcohol, Ethyl (B): <5 mg/dL (ref <5)

## 2015-05-13 MED ORDER — STROKE: EARLY STAGES OF RECOVERY BOOK
Freq: Once | Status: AC
  Administered 2015-05-13: 22:00:00
  Filled 2015-05-13: qty 1

## 2015-05-13 MED ORDER — HYDRALAZINE HCL 20 MG/ML IJ SOLN
5.0000 mg | Freq: Four times a day (QID) | INTRAMUSCULAR | Status: DC | PRN
  Administered 2015-05-16: 5 mg via INTRAVENOUS

## 2015-05-13 MED ORDER — ACETAMINOPHEN 325 MG PO TABS: 650.0000 mg | ORAL_TABLET | ORAL | Status: DC | PRN

## 2015-05-13 MED ORDER — INSULIN ASPART 100 UNIT/ML ~~LOC~~ SOLN
0.0000 [IU] | SUBCUTANEOUS | Status: DC
  Administered 2015-05-14: 1 [IU] via SUBCUTANEOUS
  Administered 2015-05-14: 3 [IU] via SUBCUTANEOUS
  Administered 2015-05-14: 2 [IU] via SUBCUTANEOUS
  Administered 2015-05-14: 1 [IU] via SUBCUTANEOUS
  Administered 2015-05-14: 2 [IU] via SUBCUTANEOUS
  Administered 2015-05-14: 1 [IU] via SUBCUTANEOUS
  Administered 2015-05-15: 2 [IU] via SUBCUTANEOUS
  Administered 2015-05-15 (×4): 3 [IU] via SUBCUTANEOUS
  Administered 2015-05-15: 2 [IU] via SUBCUTANEOUS
  Administered 2015-05-16: 5 [IU] via SUBCUTANEOUS
  Administered 2015-05-16 (×2): 3 [IU] via SUBCUTANEOUS
  Administered 2015-05-16: 1 [IU] via SUBCUTANEOUS
  Administered 2015-05-16 – 2015-05-17 (×2): 2 [IU] via SUBCUTANEOUS
  Administered 2015-05-17: 1 [IU] via SUBCUTANEOUS

## 2015-05-13 MED ORDER — ASPIRIN 325 MG PO TABS
325.0000 mg | ORAL_TABLET | Freq: Every day | ORAL | Status: DC
  Administered 2015-05-15 – 2015-05-16 (×2): 325 mg via ORAL
  Filled 2015-05-13 (×3): qty 1

## 2015-05-13 MED ORDER — ACETAMINOPHEN 650 MG RE SUPP: 650.0000 mg | RECTAL | Status: DC | PRN

## 2015-05-13 MED ORDER — LABETALOL HCL 5 MG/ML IV SOLN
10.0000 mg | Freq: Once | INTRAVENOUS | Status: AC
  Administered 2015-05-13: 10 mg via INTRAVENOUS
  Filled 2015-05-13: qty 4

## 2015-05-13 MED ORDER — ONDANSETRON HCL 4 MG/2ML IJ SOLN
4.0000 mg | Freq: Once | INTRAMUSCULAR | Status: AC
  Administered 2015-05-13: 4 mg via INTRAVENOUS
  Filled 2015-05-13: qty 2

## 2015-05-13 MED ORDER — LEVOTHYROXINE SODIUM 100 MCG IV SOLR
25.0000 ug | Freq: Every day | INTRAVENOUS | Status: DC
  Administered 2015-05-14: 25 ug via INTRAVENOUS
  Filled 2015-05-13 (×4): qty 5

## 2015-05-13 MED ORDER — ASPIRIN 300 MG RE SUPP
300.0000 mg | Freq: Every day | RECTAL | Status: DC
  Administered 2015-05-13 – 2015-05-17 (×3): 300 mg via RECTAL
  Filled 2015-05-13 (×2): qty 1

## 2015-05-13 MED ORDER — INSULIN GLARGINE 100 UNIT/ML ~~LOC~~ SOLN
15.0000 [IU] | Freq: Every day | SUBCUTANEOUS | Status: DC
  Administered 2015-05-14 – 2015-05-16 (×3): 15 [IU] via SUBCUTANEOUS
  Filled 2015-05-13 (×5): qty 0.15

## 2015-05-13 MED ORDER — HEPARIN SODIUM (PORCINE) 5000 UNIT/ML IJ SOLN
5000.0000 [IU] | Freq: Three times a day (TID) | INTRAMUSCULAR | Status: DC
  Administered 2015-05-13 – 2015-05-17 (×11): 5000 [IU] via SUBCUTANEOUS
  Filled 2015-05-13 (×10): qty 1

## 2015-05-13 MED ORDER — PANTOPRAZOLE SODIUM 40 MG IV SOLR
40.0000 mg | Freq: Every day | INTRAVENOUS | Status: DC
  Administered 2015-05-13 – 2015-05-14 (×2): 40 mg via INTRAVENOUS
  Filled 2015-05-13 (×2): qty 40

## 2015-05-13 MED ORDER — SODIUM CHLORIDE 0.9 % IV SOLN: INTRAVENOUS | Status: DC

## 2015-05-13 MED ORDER — LORAZEPAM 2 MG/ML IJ SOLN
1.0000 mg | Freq: Once | INTRAMUSCULAR | Status: AC
  Administered 2015-05-13: 1 mg via INTRAVENOUS
  Filled 2015-05-13: qty 1

## 2015-05-13 MED ORDER — ASPIRIN 300 MG RE SUPP
300.0000 mg | Freq: Once | RECTAL | Status: AC
  Administered 2015-05-13: 300 mg via RECTAL
  Filled 2015-05-13: qty 1

## 2015-05-13 NOTE — ED Notes (Signed)
Pt to CT with Zoll and Rn. Non sustained Vtach noted

## 2015-05-13 NOTE — H&P (Signed)
History and Physical        Hospital Admission Note Date: 05/13/2015  Patient name: Robert [name] Medical record number: [id number] Date of birth: 1935-07-23 Age: 80 y.o. Gender: male  PCP: Geoffery Lyons, MD  Referring physician: Dr Rhina Brackett  Chief Complaint:  Confused with expressive aphasia  HPI: Patient is a 80 year old male with diabetes mellitus, insulin-dependent, GERD, hypertension, hyperlipidemia, CAD, hypothyroidism presented from home via EMS for confusion and difficulty speaking. Patient is currently confused and unable to provide any history himself. No family member is present at the bedside. Admission history is based on EMS and ER physician report. Last known normal was around 9 PM last night per ER report. Per report, patient had nausea and vomiting prior to presentation. Patient denies any pain. No fevers or chills noted. 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 ER workup temp 97.7, heart rate 79, respiratory rate 20. BP was 228/90 at the time of triage.  Labs showed sodium 141, potassium 4.6, BUN 32, creatinine 2.6, unknown baseline.  Hemoglobin 12.2, hematocrit 36.0 UA pending CT head showed no acute intracranial hemorrhage, asymmetric right frontal subcortical and deep white matter hypodensity, acute right frontal infarct cannot be excluded. Chronic cerebral volume loss, chronic small vessel ischemia in the periventricular white matter.    Review of Systems:  Unable to obtain from the patient due to his mental status  Past Medical History: Past Medical History  Diagnosis Date  . Allergy     year round  . Cataract   . Diabetes mellitus   . GERD (gastroesophageal reflux disease)   . Hyperlipidemia   . Hypertension   . Myocardial infarction ([facility]) 2011  . Thyroid disease     hypothyroid  . Hearing deficit      Past Surgical History  Procedure Laterality Date  . Stents    . Coronary angioplasty with stent placement  07/2009    3 stents    Medications: Prior to Admission medications   Medication Sig Start Date End Date Taking? Authorizing Provider  albuterol (PROVENTIL HFA;VENTOLIN HFA) 108 (90 Base) MCG/ACT inhaler Inhale 2 puffs into the lungs every 6 (six) hours as needed for wheezing or shortness of breath.   Yes Historical Provider, MD  aspirin 325 MG EC tablet Take 325 mg by mouth daily.   Yes Historical Provider, MD  Blood Glucose Calibration (ACCU-CHEK AVIVA) SOLN  06/02/11  Yes Historical Provider, MD  fenofibrate (TRICOR) 145 MG tablet Take 1 tablet by mouth Daily. 04/20/11  Yes Historical Provider, MD  fexofenadine (ALLEGRA) 180 MG tablet Take 180 mg by mouth daily.   Yes Historical Provider, MD  HUMALOG 100 UNIT/ML injection Inject 30 Units into the skin daily. 03/03/15  Yes Historical Provider, MD  Lancets ([place]) lancets  06/02/11  Yes Historical Provider, MD  levothyroxine (SYNTHROID, LEVOTHROID) 50 MCG tablet Take 1 tablet by mouth daily. 03/03/15  Yes Historical Provider, MD  metoprolol succinate (TOPROL-XL) 50 MG 24 hr tablet Take 1 tablet by mouth Daily. 05/18/11  Yes Historical Provider, MD  Multiple Vitamins-Minerals (ANTIOXIDANT FORMULA SG) capsule Take 1 capsule by mouth daily.   Yes Historical Provider,  MD  Multiple Vitamins-Minerals (MULTIVITAMIN WITH MINERALS) tablet Take 1 tablet by mouth daily.   Yes Historical Provider, MD  omeprazole (PRILOSEC) 20 MG capsule Take 1 capsule by mouth Daily. 04/20/11  Yes Historical Provider, MD  simvastatin (ZOCOR) 20 MG tablet Take 1 tablet by mouth Daily. 04/06/11  Yes Historical Provider, MD  ACCU-CHEK AVIVA PLUS test strip  03/08/11   Historical Provider, MD  amLODipine (NORVASC) 5 MG tablet Take 1 tablet by mouth Daily. Reported on 05/13/2015 05/18/11   Historical Provider, MD  LANTUS SOLOSTAR 100 UNIT/ML injection Inject 30 Units  into the skin Daily. Reported on 05/13/2015 03/01/11   Historical Provider, MD  metFORMIN (GLUCOPHAGE) 500 MG tablet Take 1 tablet by mouth Twice daily. Reported on 05/13/2015 04/23/11   Historical Provider, MD  peg 3350 powder (MOVIPREP) SOLR MOVI PREP take as directed Patient not taking: Reported on 05/13/2015 06/06/11   Irene Shipper, MD  pioglitazone (ACTOS) 45 MG tablet Take 1 tablet by mouth Daily. Reported on 05/13/2015 04/23/11   Historical Provider, MD    Allergies:   Allergies  Allergen Reactions  . Lisinopril     "made me lose my voice"    Social History:  Per records, reports that he has quit smoking. He has never used smokeless tobacco. He reports that he drinks about 1.2 oz of alcohol per week. He reports that he does not use illicit drugs.  Family History: Unable to obtain from the patient due to his mental status  Physical Exam: Blood pressure 228/90, pulse 79, temperature 97.7 F (36.5 C), temperature source Oral, resp. rate 20, height 6' (1.829 m), weight 81.647 kg (180 lb), SpO2 98 %. General: Alert, but confused, oriented to self and states month is March otherwise unable to provide any history or any further orientation questions, NAD, slow speech with dysarthria. No facial drooping. HEENT: normocephalic, atraumatic, anicteric sclera, pink conjunctiva, pupils equal and reactive to light and accomodation, oropharynx clear Neck: supple, no masses or lymphadenopathy, no goiter, no bruits  Heart: Regular rate and rhythm, without murmurs, rubs or gallops. Lungs: Clear to auscultation bilaterally, no wheezing, rales or rhonchi. Abdomen: Soft, nontender, nondistended, positive bowel sounds Extremities: No clubbing, cyanosis or edema with positive pedal pulses. Neuro: expressive aphasia, confused, does not follow commands for complete neuro examination, right side appeared to be weaker than left Psych: confused Skin: no rashes or lesions, warm and dry   LABS on Admission:   Basic Metabolic Panel:  Recent Labs Lab 05/13/15 1629 05/13/15 1633  NA 141 141  K 4.7 4.6  CL 109 109  CO2 22  --   GLUCOSE 272* 262*  BUN 31* 32*  CREATININE 2.60* 2.60*  CALCIUM 8.6*  --    Liver Function Tests:  Recent Labs Lab 05/13/15 1629  AST 21  ALT 15*  ALKPHOS 86  BILITOT 0.5  PROT 5.7*  ALBUMIN 2.6*   No results for input(s): LIPASE, AMYLASE in the last 168 hours. No results for input(s): AMMONIA in the last 168 hours. CBC:  Recent Labs Lab 05/13/15 1629 05/13/15 1633  WBC 4.9  --   NEUTROABS 3.6  --   HGB 11.1* 12.2*  HCT 35.6* 36.0*  MCV 86.6  --   PLT 140*  --    Cardiac Enzymes: No results for input(s): CKTOTAL, CKMB, CKMBINDEX, TROPONINI in the last 168 hours. BNP: Invalid input(s): POCBNP CBG: No results for input(s): GLUCAP in the last 168 hours.  Radiological Exams on Admission:  Ct  Head Wo Contrast  05/13/2015  CLINICAL DATA:  Aphasia.  Right upper extremity weakness. EXAM: CT HEAD WITHOUT CONTRAST TECHNIQUE: Contiguous axial images were obtained from the base of the skull through the vertex without intravenous contrast. COMPARISON:  None. FINDINGS: No evidence of parenchymal hemorrhage or extra-axial fluid collection. No mass lesion, mass effect, or midline shift. There is asymmetric hypodensity in the right frontal subcortical and deep white matter. There is questionable focus of loss of gray-white differentiation in the right frontal lobe (series 2/ image 23). Intracranial atherosclerosis. Mild cerebral volume loss. No ventriculomegaly. The visualized paranasal sinuses are essentially clear. The mastoid air cells are unopacified. No evidence of calvarial fracture. IMPRESSION: 1. No acute intracranial hemorrhage. 2. Asymmetric right frontal subcortical and deep white matter hypodensity, probably due to chronic small vessel ischemia, although there is a small focus of questionable loss of gray-white differentiation in the right frontal lobe,  for which an acute right frontal infarct cannot be excluded. Correlate with brain MRI as clinically warranted. 3. Cerebral volume loss, intracranial atherosclerosis and chronic small vessel ischemia in the periventricular white matter. Electronically Signed   By: Ilona Sorrel M.D.   On: 05/13/2015 16:55    *I have personally reviewed the images above*  EKG: Independently reviewed. Rate 87, normal sinus rhythm, no acute ST-T wave changes suggestive of ischemia   Assessment/Plan Principal Problem:  Acute encephalopathy possibly due to CVA (cerebral infarction), unclear if patient has chronic kidney disease - Admit to telemetry, per EDP, neurology consult has been obtained, patient out of TPA window due to delayed presentation - Obtain MRI, MRA, 2-D echo, carotid Dopplers for further workup - NPO until stroke swallow evaluation, PT, OT, SLP - Aspirin PR until patient able to tolerate diet - Lipid panel, hemoglobin A1c - Follow neurology recommendations - Cautious BP control, gentle IV fluids, obtain UA and culture  Active Problems:   Diabetes mellitus (HCC) type 2 insulin-dependent - Placed on sliding scale insulin, half dose of Lantus 15units (patient on 30 units at bedtime outpatient dose)  due to NPO status   Acute kidney injury versus chronic kidney disease: The patient may have underlying chronic kidney disease due to hypertensive and diabetic nephropathy - Follow UA, culture, urine lites, renal ultrasound, gentle hydration    ANEMIA, chronic - Obtain anemia panel    GERD -Placed on IV PPI    Hypertensive urgency - Hold oral antihypertensives, patient on amlodipine, metoprolol outpatient - For now placed on IV hydralazine with parameters to avoid hypotension    Hyperlipidemia - Lipid panel in a.m., statins once patient tolerating oral diet  Hypothyroidism - Obtain TSH, placed on IV Synthroid half dose  DVT prophylaxis: Heparin subcutaneous  CODE STATUS: Unable to obtain  CODE STATUS from the patient due to his mental status, for now placed on FULL code status, no family member at the bedside  Family Communication: No family member at the bedside  Disposition plan: Further plan will depend as patient's clinical course evolves and further radiologic and laboratory data become available.   Time Spent on Admission: 56 minutes   Kevonte Vanecek M.D. [facility] 05/13/2015, 6:16 PM Pager: [phone]  If 7PM-7AM, please contact night-coverage [email or web address] Password [email or web address]

## 2015-05-13 NOTE — ED Notes (Signed)
To ED, via [facility], from home for eval of expressive aphagia. Last known normal last pt. 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 Family spoke to pt last pm around 9.

## 2015-05-13 NOTE — ED Provider Notes (Signed)
CSN: [id number]     Arrival date & time 05/13/15  1536 History   First MD Initiated Contact with Patient 05/13/15 1545     Chief Complaint  Patient presents with  . Stroke Symptoms     (Consider location/radiation/quality/duration/timing/severity/associated sxs/prior Treatment) Patient is a 80 y.o. male presenting with Acute Neurological Problem. The history is provided by the patient and the EMS personnel.  Cerebrovascular Accident This is a new (Expressive aphasia and right arm weakness) problem. Episode onset: unknown onset but last known normal was 9pm last night (aproximately 18 hours prior to arrival to ED) The problem occurs constantly. Progression since onset: unknown. Associated symptoms include nausea, vomiting and weakness. Pertinent negatives include no chest pain or fever.    Past Medical History  Diagnosis Date  . Allergy     year round  . Cataract   . Diabetes mellitus   . GERD (gastroesophageal reflux disease)   . Hyperlipidemia   . Hypertension   . Myocardial infarction ([place]) 2011  . Thyroid disease     hypothyroid  . Hearing deficit    Past Surgical History  Procedure Laterality Date  . Stents    . Coronary angioplasty with stent placement  07/2009    3 stents  . Insulin pump needs to be removed for mri     History reviewed. No pertinent family history. Social History  Substance Use Topics  . Smoking status: Former Research scientist (life sciences)  . Smokeless tobacco: Never Used  . Alcohol Use: 1.2 oz/week    2 Glasses of wine per week    Review of Systems  Unable to perform ROS: Other  Constitutional: Negative for fever.  Cardiovascular: Negative for chest pain.  Gastrointestinal: Positive for nausea and vomiting.  Neurological: Positive for speech difficulty and weakness. Negative for seizures, syncope and facial asymmetry.      Allergies  Lisinopril  Home Medications   Prior to Admission medications   Medication Sig Start Date End Date Taking? Authorizing  Provider  albuterol (PROVENTIL HFA;VENTOLIN HFA) 108 (90 Base) MCG/ACT inhaler Inhale 2 puffs into the lungs every 6 (six) hours as needed for wheezing or shortness of breath.   Yes Historical Provider, MD  aspirin 325 MG EC tablet Take 325 mg by mouth daily.   Yes Historical Provider, MD  Blood Glucose Calibration (ACCU-CHEK AVIVA) SOLN  06/02/11  Yes Historical Provider, MD  fenofibrate (TRICOR) 145 MG tablet Take 1 tablet by mouth Daily. 04/20/11  Yes Historical Provider, MD  fexofenadine (ALLEGRA) 180 MG tablet Take 180 mg by mouth daily.   Yes Historical Provider, MD  HUMALOG 100 UNIT/ML injection Inject 30 Units into the skin daily. 03/03/15  Yes Historical Provider, MD  Lancets ([place]) lancets  06/02/11  Yes Historical Provider, MD  levothyroxine (SYNTHROID, LEVOTHROID) 50 MCG tablet Take 1 tablet by mouth daily. 03/03/15  Yes Historical Provider, MD  metoprolol succinate (TOPROL-XL) 50 MG 24 hr tablet Take 1 tablet by mouth Daily. 05/18/11  Yes Historical Provider, MD  Multiple Vitamins-Minerals (ANTIOXIDANT FORMULA SG) capsule Take 1 capsule by mouth daily.   Yes Historical Provider, MD  Multiple Vitamins-Minerals (MULTIVITAMIN WITH MINERALS) tablet Take 1 tablet by mouth daily.   Yes Historical Provider, MD  omeprazole (PRILOSEC) 20 MG capsule Take 1 capsule by mouth Daily. 04/20/11  Yes Historical Provider, MD  simvastatin (ZOCOR) 20 MG tablet Take 1 tablet by mouth Daily. 04/06/11  Yes Historical Provider, MD  [place] test strip  03/08/11   Historical Provider,  MD  amLODipine (NORVASC) 5 MG tablet Take 1 tablet by mouth Daily. Reported on 05/13/2015 05/18/11   Historical Provider, MD  LANTUS SOLOSTAR 100 UNIT/ML injection Inject 30 Units into the skin Daily. Reported on 05/13/2015 03/01/11   Historical Provider, MD  metFORMIN (GLUCOPHAGE) 500 MG tablet Take 1 tablet by mouth Twice daily. Reported on 05/13/2015 04/23/11   Historical Provider, MD  peg 3350 powder (MOVIPREP) SOLR  MOVI PREP take as directed Patient not taking: Reported on 05/13/2015 06/06/11   Irene Shipper, MD  pioglitazone (ACTOS) 45 MG tablet Take 1 tablet by mouth Daily. Reported on 05/13/2015 04/23/11   Historical Provider, MD   BP 228/90 mmHg  Pulse 79  Temp(Src) 97.7 F (36.5 C) (Oral)  Resp 20  Ht 6' (1.829 m)  Wt 81.647 kg  BMI 24.41 kg/m2  SpO2 98% Physical Exam  Constitutional: He appears well-developed and well-nourished. He appears distressed (in minor distress, actively vomiting).  HENT:  Head: Normocephalic and atraumatic.  Right Ear: External ear normal.  Left Ear: External ear normal.  Nose: Nose normal.  Eyes: Conjunctivae and EOM are normal. Pupils are equal, round, and reactive to light.  Neck: Normal range of motion. Neck supple. No JVD present. No tracheal deviation present. No thyromegaly present.  Cardiovascular: Normal rate, regular rhythm and intact distal pulses.   Pulmonary/Chest: Effort normal and breath sounds normal. No stridor. No respiratory distress. He has no wheezes. He has no rales.  Abdominal: Soft. There is no tenderness. There is no rebound and no guarding.  Musculoskeletal: He exhibits no edema or tenderness.  Neurological: He is alert. GCS eye subscore is 4. GCS verbal subscore is 4. GCS motor subscore is 6.  No facial droop. Slowed speech. Oriented to person but not place or time. 1/5 right arm strength compared to 5/5 left arm strength.   Skin: Skin is warm and dry. He is not diaphoretic.  Nursing note and vitals reviewed.   ED Course  Procedures (including critical care time) Labs Review Labs Reviewed  CBC - Abnormal; Notable for the following:    RBC 4.11 (*)    Hemoglobin 11.1 (*)    HCT 35.6 (*)    Platelets 140 (*)    All other components within normal limits  COMPREHENSIVE METABOLIC PANEL - Abnormal; Notable for the following:    Glucose, Bld 272 (*)    BUN 31 (*)    Creatinine, Ser 2.60 (*)    Calcium 8.6 (*)    Total Protein 5.7 (*)     Albumin 2.6 (*)    ALT 15 (*)    GFR calc non Af Amer 22 (*)    GFR calc Af Amer 26 (*)    All other components within normal limits  I-STAT CHEM 8, ED - Abnormal; Notable for the following:    BUN 32 (*)    Creatinine, Ser 2.60 (*)    Glucose, Bld 262 (*)    Calcium, Ion 1.11 (*)    Hemoglobin 12.2 (*)    HCT 36.0 (*)    All other components within normal limits  URINE CULTURE  ETHANOL  PROTIME-INR  APTT  DIFFERENTIAL  URINE RAPID DRUG SCREEN, HOSP PERFORMED  URINALYSIS, ROUTINE W REFLEX MICROSCOPIC (NOT AT [facility])  URINALYSIS, ROUTINE W REFLEX MICROSCOPIC (NOT AT [facility])  I-STAT TROPOININ, ED    Imaging Review Ct Head Wo Contrast  05/13/2015  CLINICAL DATA:  Aphasia.  Right upper extremity weakness. EXAM: CT HEAD WITHOUT CONTRAST TECHNIQUE:  Contiguous axial images were obtained from the base of the skull through the vertex without intravenous contrast. COMPARISON:  None. FINDINGS: No evidence of parenchymal hemorrhage or extra-axial fluid collection. No mass lesion, mass effect, or midline shift. There is asymmetric hypodensity in the right frontal subcortical and deep white matter. There is questionable focus of loss of gray-white differentiation in the right frontal lobe (series 2/ image 23). Intracranial atherosclerosis. Mild cerebral volume loss. No ventriculomegaly. The visualized paranasal sinuses are essentially clear. The mastoid air cells are unopacified. No evidence of calvarial fracture. IMPRESSION: 1. No acute intracranial hemorrhage. 2. Asymmetric right frontal subcortical and deep white matter hypodensity, probably due to chronic small vessel ischemia, although there is a small focus of questionable loss of gray-white differentiation in the right frontal lobe, for which an acute right frontal infarct cannot be excluded. Correlate with brain MRI as clinically warranted. 3. Cerebral volume loss, intracranial atherosclerosis and chronic small vessel ischemia in the  periventricular white matter. Electronically Signed   By: Ilona Sorrel M.D.   On: 05/13/2015 16:55   I have personally reviewed and evaluated these images and lab results as part of my medical decision-making.   EKG Interpretation   Date/Time:  Saturday May 13 2015 15:52:21 EDT Ventricular Rate:  87 PR Interval:  193 QRS Duration: 87 QT Interval:  354 QTC Calculation: 426 R Axis:   44 Text Interpretation:  Sinus rhythm Atrial premature complexes Anterior  infarct, old Nonspecific repol abnormality, diffuse leads Confirmed by  ZAVITZ  MD, JOSHUA ([id number]) on 05/13/2015 3:55:07 PM      MDM   Final diagnoses:  CVA (cerebral infarction)    The patient is a 80 year old male with a history of coronary artery disease, CKD, hypertension who presents with aphasia and right arm weakness. Last known normal at 9 PM last night per family. Arrival patient was noted to be hypertensive but is afebrile and hemodynamically stable. He is noted to be nausea with vomiting as Zofran. Head CT does not show any acute head bleed. Patient is given rectal aspirin. Patient is far outside window for intervention as he presented to the emergency department over 18 hours after last known normal. Neurology's consultation advised the patient daily. Please note for more detail. Patient is admitted to the hospitalist service with neurology following on for further stroke care. Patient's family updated on patient's status.  Patient seen with attending, Dr. Reather Converse, who oversaw clinical decision making.    Margaretann Loveless, MD 05/13/15 1839  Elnora Morrison, MD 05/13/15 2325

## 2015-05-13 NOTE — Consult Note (Signed)
Requesting Physician: Dr. Rhina Brackett      Reason for consultation: Aphasia  HPI:                                                                                                                                         Robert [name] is an 80 y.o. male patient who was brought into the emergency room by his family members for further neurological evaluation. Patient lives with his wife who has severe dementia. His children last spoke to him last night on phone and was last seen physically 48 hours ago. Today when they visited his house, he was found to be unresponsive and not following commands.  Patient unable to provide any history. His wife lives with him,  was not available in the ER.  family reported that his wife has severe dementia and would not be able to provide any history.    Date last known well:  05/12/2015 Time last known well:  Evening sometime tPA Given: No: Outside the time window  Stroke Risk Factors - diabetes mellitus, hyperlipidemia and hypertension  Past Medical History: Past Medical History  Diagnosis Date  . Allergy     year round  . Cataract   . Diabetes mellitus   . GERD (gastroesophageal reflux disease)   . Hyperlipidemia   . Hypertension   . Myocardial infarction ([place]) 2011  . Thyroid disease     hypothyroid  . Hearing deficit     Past Surgical History  Procedure Laterality Date  . Stents    . Coronary angioplasty with stent placement  07/2009    3 stents    Family History: History reviewed. No pertinent family history.  Social History:   reports that he has quit smoking. He has never used smokeless tobacco. He reports that he drinks about 1.2 oz of alcohol per week. He reports that he does not use illicit drugs.  Allergies:  Allergies  Allergen Reactions  . Lisinopril     "made me lose my voice"     Medications:                                                                                                                         Current  facility-administered medications:  .  0.9 %  sodium chloride infusion, , Intravenous, Continuous, Ripudeep K Rai, MD .  aspirin suppository 300 mg, 300  mg, Rectal, Once, Margaretann Loveless, MD .  hydrALAZINE (APRESOLINE) injection 5 mg, 5 mg, Intravenous, Q6H PRN, Ripudeep K Rai, MD .  labetalol (NORMODYNE,TRANDATE) injection 10 mg, 10 mg, Intravenous, Once, Margaretann Loveless, MD  Current outpatient prescriptions:  .  albuterol (PROVENTIL HFA;VENTOLIN HFA) 108 (90 Base) MCG/ACT inhaler, Inhale 2 puffs into the lungs every 6 (six) hours as needed for wheezing or shortness of breath., Disp: , Rfl:  .  aspirin 325 MG EC tablet, Take 325 mg by mouth daily., Disp: , Rfl:  .  Blood Glucose Calibration (ACCU-CHEK AVIVA) SOLN, , Disp: , Rfl:  .  fenofibrate (TRICOR) 145 MG tablet, Take 1 tablet by mouth Daily., Disp: , Rfl:  .  fexofenadine (ALLEGRA) 180 MG tablet, Take 180 mg by mouth daily., Disp: , Rfl:  .  HUMALOG 100 UNIT/ML injection, Inject 30 Units into the skin daily., Disp: , Rfl:  .  Lancets (ACCU-CHEK MULTICLIX) lancets, , Disp: , Rfl:  .  levothyroxine (SYNTHROID, LEVOTHROID) 50 MCG tablet, Take 1 tablet by mouth daily., Disp: , Rfl:  .  metoprolol succinate (TOPROL-XL) 50 MG 24 hr tablet, Take 1 tablet by mouth Daily., Disp: , Rfl:  .  Multiple Vitamins-Minerals (ANTIOXIDANT FORMULA SG) capsule, Take 1 capsule by mouth daily., Disp: , Rfl:  .  Multiple Vitamins-Minerals (MULTIVITAMIN WITH MINERALS) tablet, Take 1 tablet by mouth daily., Disp: , Rfl:  .  omeprazole (PRILOSEC) 20 MG capsule, Take 1 capsule by mouth Daily., Disp: , Rfl:  .  simvastatin (ZOCOR) 20 MG tablet, Take 1 tablet by mouth Daily., Disp: , Rfl:  .  ACCU-CHEK AVIVA PLUS test strip, , Disp: , Rfl:  .  amLODipine (NORVASC) 5 MG tablet, Take 1 tablet by mouth Daily. Reported on 05/13/2015, Disp: , Rfl:  .  LANTUS SOLOSTAR 100 UNIT/ML injection, Inject 30 Units into the skin Daily. Reported on 05/13/2015, Disp: , Rfl:  .  metFORMIN  (GLUCOPHAGE) 500 MG tablet, Take 1 tablet by mouth Twice daily. Reported on 05/13/2015, Disp: , Rfl:  .  peg 3350 powder (MOVIPREP) SOLR, MOVI PREP take as directed (Patient not taking: Reported on 05/13/2015), Disp: 1 kit, Rfl: 0 .  pioglitazone (ACTOS) 45 MG tablet, Take 1 tablet by mouth Daily. Reported on 05/13/2015, Disp: , Rfl:    ROS:                                                                                                                                       History unobtainable from patient due to mental status  Neurologic Examination:  Today's Vitals   05/13/15 1557 05/13/15 1630 05/13/15 1645 05/13/15 1707  BP: 208/99 225/88 228/90   Pulse:  79 79   Temp: 97.6 F (36.4 C)   97.7 F (36.5 C)  TempSrc: Oral     Resp: 118 20 20   Height:      Weight:      SpO2: 99% 98% 98%     Evaluation of higher integrative functions including: Level of alertness: Alert,  Unable to assess to time, place and person due to aphasia Speech: Spoke nonsensical words fluently he kept repeating my sunshine, global aphasia, does not follow verbal commands but cooperative and follows hand gestures.  Test the following cranial nerves: 2-12 grossly intact Motor examination: Normal tone, bulk, mild drift in the right upper and lower extremity is noted no drift on the left side not cooperative for resistance testing  Examination of sensation : Unable to assess due to aphasia Examination of deep tendon reflexes: 2+,symmetric in all extremities, normal plantars bilaterally Test coordination: Normal finger nose testing, with no evidence of limb appendicular ataxia or abnormal involuntary movements or tremors noted.     Lab Results: Basic Metabolic Panel:  Recent Labs Lab 05/13/15 1629 05/13/15 1633  NA 141 141  K 4.7 4.6  CL 109 109  CO2 22  --   GLUCOSE 272* 262*  BUN 31* 32*  CREATININE  2.60* 2.60*  CALCIUM 8.6*  --     Liver Function Tests:  Recent Labs Lab 05/13/15 1629  AST 21  ALT 15*  ALKPHOS 86  BILITOT 0.5  PROT 5.7*  ALBUMIN 2.6*   No results for input(s): LIPASE, AMYLASE in the last 168 hours. No results for input(s): AMMONIA in the last 168 hours.  CBC:  Recent Labs Lab 05/13/15 1629 05/13/15 1633  WBC 4.9  --   NEUTROABS 3.6  --   HGB 11.1* 12.2*  HCT 35.6* 36.0*  MCV 86.6  --   PLT 140*  --     Cardiac Enzymes: No results for input(s): CKTOTAL, CKMB, CKMBINDEX, TROPONINI in the last 168 hours.  Lipid Panel: No results for input(s): CHOL, TRIG, HDL, CHOLHDL, VLDL, LDLCALC in the last 168 hours.  CBG: No results for input(s): GLUCAP in the last 168 hours.  Microbiology: Results for orders placed or performed during the hospital encounter of 08/18/09  MRSA PCR Screening     Status: None   Collection Time: 08/18/09 12:38 PM  Result Value Ref Range Status   MRSA by PCR  NEGATIVE Final    NEGATIVE        The GeneXpert MRSA Assay (FDA approved for NASAL specimens only), is one component of a comprehensive MRSA colonization surveillance program. It is not intended to diagnose MRSA infection nor to guide or monitor treatment for MRSA infections.     Imaging: Ct Head Wo Contrast  05/13/2015  CLINICAL DATA:  Aphasia.  Right upper extremity weakness. EXAM: CT HEAD WITHOUT CONTRAST TECHNIQUE: Contiguous axial images were obtained from the base of the skull through the vertex without intravenous contrast. COMPARISON:  None. FINDINGS: No evidence of parenchymal hemorrhage or extra-axial fluid collection. No mass lesion, mass effect, or midline shift. There is asymmetric hypodensity in the right frontal subcortical and deep white matter. There is questionable focus of loss of gray-white differentiation in the right frontal lobe (series 2/ image 23). Intracranial atherosclerosis. Mild cerebral volume loss. No ventriculomegaly. The visualized  paranasal sinuses are essentially clear. The mastoid air cells are  unopacified. No evidence of calvarial fracture. IMPRESSION: 1. No acute intracranial hemorrhage. 2. Asymmetric right frontal subcortical and deep white matter hypodensity, probably due to chronic small vessel ischemia, although there is a small focus of questionable loss of gray-white differentiation in the right frontal lobe, for which an acute right frontal infarct cannot be excluded. Correlate with brain MRI as clinically warranted. 3. Cerebral volume loss, intracranial atherosclerosis and chronic small vessel ischemia in the periventricular white matter. Electronically Signed   By: Ilona Sorrel M.D.   On: 05/13/2015 16:55      Assessment and plan:   Robert [name] is an 80 y.o. male patient who was brought in for evaluation of altered mental status. His clinical examination is suggestive of a global aphasia. He spoke only a nonsensical sentences and kept repeating it. Does not follow commands. Appears to mild drift in the right upper and lower extremity. His clinical presentation is suggestive for left MCA stroke. CT of the head showed some frontoparietal hypodensities bilaterally, believed to be chronic microvascular changes. He will be admitted to hospitalist service for further stroke workup. She describes her kidney failure, MRI of the head with and without contrast is recommended compared to CTA. Also obtain an MRI of the brain without contrast. Cardiac testing with echocardiogram with bubble study and we'll placed on telemetry monitoring. Check A1c and lipid profile.  Physical therapy for gait and balance training. occupational therapy and swallow evaluation with speech pathology also recommended Stat Rectal aspirin 300 mg daily until he passes swallow evaluation. At that time switched to Plavix 75 mg daily and stop aspirin he was taking 81 mg aspirin at home  Neurology stroke team will continue to follow starting tomorrow  morning

## 2015-05-13 NOTE — ED Notes (Signed)
Vgo insulin pump removed from Abdomen in response to verbal order by Dr. Armida Sans  after instruction by family and review of instruction material. No bleeding or marks noted to abdomen after removal.

## 2015-05-14 ENCOUNTER — Inpatient Hospital Stay (HOSPITAL_COMMUNITY): Payer: Medicare Other

## 2015-05-14 DIAGNOSIS — G9340 Encephalopathy, unspecified: Secondary | ICD-10-CM

## 2015-05-14 DIAGNOSIS — I639 Cerebral infarction, unspecified: Secondary | ICD-10-CM

## 2015-05-14 LAB — GLUCOSE, CAPILLARY
GLUCOSE-CAPILLARY: 213 mg/dL — AB (ref 65–99)
GLUCOSE-CAPILLARY: 157 mg/dL — AB (ref 65–99)
GLUCOSE-CAPILLARY: 136 mg/dL — AB (ref 65–99)
GLUCOSE-CAPILLARY: 199 mg/dL — AB (ref 65–99)
Glucose-Capillary: 149 mg/dL — ABNORMAL HIGH (ref 65–99)

## 2015-05-14 LAB — LIPID PANEL
CHOLESTEROL: 268 mg/dL — AB (ref 0–200)
HDL: 30 mg/dL — ABNORMAL LOW (ref >40)
LDL Cholesterol: UNDETERMINED mg/dL (ref 0–99)
Total CHOL/HDL Ratio: 8.9 RATIO
Triglycerides: 699 mg/dL — ABNORMAL HIGH (ref <150)
VLDL: UNDETERMINED mg/dL (ref 0–40)

## 2015-05-14 LAB — FOLATE: Folate: 21.1 ng/mL (ref >5.9)

## 2015-05-14 LAB — CBC
HEMATOCRIT: 33.7 % — AB (ref 39.0–52.0)
HEMOGLOBIN: 10.6 g/dL — AB (ref 13.0–17.0)
MCH: 27.2 pg (ref 26.0–34.0)
MCHC: 31.5 g/dL (ref 30.0–36.0)
MCV: 86.4 fL (ref 78.0–100.0)
Platelets: 132 10*3/uL — ABNORMAL LOW (ref 150–400)
RBC: 3.9 MIL/uL — ABNORMAL LOW (ref 4.22–5.81)
RDW: 15.2 % (ref 11.5–15.5)
WBC: 4.5 10*3/uL (ref 4.0–10.5)

## 2015-05-14 LAB — COMPREHENSIVE METABOLIC PANEL
ALT: 14 U/L — ABNORMAL LOW (ref 17–63)
ANION GAP: 10 (ref 5–15)
AST: 18 U/L (ref 15–41)
Albumin: 2.3 g/dL — ABNORMAL LOW (ref 3.5–5.0)
Alkaline Phosphatase: 76 U/L (ref 38–126)
BILIRUBIN TOTAL: 0.4 mg/dL (ref 0.3–1.2)
BUN: 31 mg/dL — ABNORMAL HIGH (ref 6–20)
CO2: 21 mmol/L — ABNORMAL LOW (ref 22–32)
Calcium: 8.3 mg/dL — ABNORMAL LOW (ref 8.9–10.3)
Chloride: 110 mmol/L (ref 101–111)
Creatinine, Ser: 2.76 mg/dL — ABNORMAL HIGH (ref 0.61–1.24)
GFR calc Af Amer: 24 mL/min — ABNORMAL LOW (ref >60)
GFR, EST NON AFRICAN AMERICAN: 21 mL/min — AB (ref >60)
Glucose, Bld: 248 mg/dL — ABNORMAL HIGH (ref 65–99)
POTASSIUM: 4.2 mmol/L (ref 3.5–5.1)
Sodium: 141 mmol/L (ref 135–145)
TOTAL PROTEIN: 5.4 g/dL — AB (ref 6.5–8.1)

## 2015-05-14 LAB — IRON AND TIBC
IRON: 45 ug/dL (ref 45–182)
Saturation Ratios: 16 % — ABNORMAL LOW (ref 17.9–39.5)
TIBC: 290 ug/dL (ref 250–450)
UIBC: 245 ug/dL

## 2015-05-14 LAB — TSH: TSH: 2.134 u[IU]/mL (ref 0.350–4.500)

## 2015-05-14 LAB — FERRITIN: FERRITIN: 60 ng/mL (ref 24–336)

## 2015-05-14 LAB — VITAMIN B12: VITAMIN B 12: 297 pg/mL (ref 180–914)

## 2015-05-14 MED ORDER — LORAZEPAM 2 MG/ML IJ SOLN
1.0000 mg | Freq: Once | INTRAMUSCULAR | Status: AC
  Administered 2015-05-14: 1 mg via INTRAVENOUS
  Filled 2015-05-14: qty 1

## 2015-05-14 MED ORDER — KCL IN DEXTROSE-NACL 20-5-0.45 MEQ/L-%-% IV SOLN
INTRAVENOUS | Status: DC
  Administered 2015-05-14: 14:00:00 via INTRAVENOUS
  Administered 2015-05-15: 1000 mL via INTRAVENOUS
  Filled 2015-05-14 (×4): qty 1000

## 2015-05-14 MED ORDER — SIMVASTATIN 40 MG PO TABS
40.0000 mg | ORAL_TABLET | Freq: Every day | ORAL | Status: DC
  Administered 2015-05-14 – 2015-05-16 (×3): 40 mg via ORAL
  Filled 2015-05-14 (×3): qty 1

## 2015-05-14 NOTE — Evaluation (Signed)
Clinical/Bedside Swallow Evaluation Patient Details  Name: Robert [name] MRN: [id number] Date of Birth: [birth year]  Today's Date: 05/14/2015 Time: SLP Start Time (ACUTE ONLY): 18 SLP Stop Time (ACUTE ONLY): 1422 SLP Time Calculation (min) (ACUTE ONLY): 20 min  Past Medical History:  Past Medical History  Diagnosis Date  . Allergy     year round  . Cataract   . Diabetes mellitus   . GERD (gastroesophageal reflux disease)   . Hyperlipidemia   . Hypertension   . Myocardial infarction ([place]) 2011  . Thyroid disease     hypothyroid  . Hearing deficit    Past Surgical History:  Past Surgical History  Procedure Laterality Date  . Stents    . Coronary angioplasty with stent placement  07/2009    3 stents  . Insulin pump needs to be removed for mri     HPI:  Patient is a 80 year old male with diabetes mellitus, insulin-dependent, GERD, hypertension, hyperlipidemia, CAD, hypothyroidism presented from home via EMS for confusion and difficulty speaking. Patient is currently confused and unable to provide any history himself. No family member is present at the bedside. Admission history is based on EMS and ER physician report. Last known normal was around 9 PM last night per ER report. Per report, patient had nausea and vomiting prior to presentation.   Assessment / Plan / Recommendation Clinical Impression  Clinical swallowing evaluation was completed.  Oral mechanism exam was completed and unremarkable.  The patient had reported possibly some facial numbness but that appeared to have resolved.  The patient presented with essentially functional oral and pharyngeal swallow.  Very mild delay in swallow trigger was noted given sips of thin liquids.  A very inconsistent throat clear was noted over course of evaluation but did not appear to be related to aspiration.  Recommend begin a regular diet and thin liquids.  ST will follow up for therapeutic diet tolerance given history of CVA and  inconsistent throat clear with PO presentation.  Speech/langage evaluation is pending.      Aspiration Risk  Mild aspiration risk    Diet Recommendation   Regular diet and thin liquids.    Medication Administration: Whole meds with liquid    Other  Recommendations Oral Care Recommendations: Oral care BID   Follow up Recommendations   (to be determined)    Frequency and Duration min 2x/week  2 weeks       Prognosis Prognosis for Safe Diet Advancement: Good      Swallow Study   General Date of Onset: 05/13/15 HPI: Patient is a 80 year old male with diabetes mellitus, insulin-dependent, GERD, hypertension, hyperlipidemia, CAD, hypothyroidism presented from home via EMS for confusion and difficulty speaking. Patient is currently confused and unable to provide any history himself. No family member is present at the bedside. Admission history is based on EMS and ER physician report. Last known normal was around 9 PM last night per ER report. Per report, patient had nausea and vomiting prior to presentation. Type of Study: Bedside Swallow Evaluation Previous Swallow Assessment: None noted.   Diet Prior to this Study: NPO Temperature Spikes Noted: No Respiratory Status: Room air History of Recent Intubation: No Behavior/Cognition: Alert;Cooperative;Pleasant mood Oral Cavity Assessment: Within Functional Limits Oral Care Completed by SLP: No Oral Cavity - Dentition: Adequate natural dentition Vision: Functional for self-feeding Self-Feeding Abilities: Able to feed self Patient Positioning: Upright in chair Baseline Vocal Quality: Normal Volitional Cough: Strong Volitional Swallow: Able to elicit    Oral/Motor/Sensory  Function Overall Oral Motor/Sensory Function: Within functional limits   Ice Chips Ice chips: Not tested   Thin Liquid Thin Liquid: Impaired Presentation: Cup;Spoon;Self Fed Pharyngeal  Phase Impairments: Suspected delayed Swallow    Nectar Thick Nectar Thick  Liquid: Not tested   Honey Thick Honey Thick Liquid: Not tested   Puree Puree: Within functional limits   Solid   GO   Solid: Within functional limits    Functional Assessment Tool Used: ASHA NOMS and clinical judgment.   Functional Limitations: Swallowing Swallow Current Status [phone]): At least 1 percent but less than 20 percent impaired, limited or restricted Swallow Goal Status [phone]): At least 1 percent but less than 20 percent impaired, limited or restricted  Shelly Flatten, [place], [place] Shelly Flatten N 05/14/2015,2:30 PM

## 2015-05-14 NOTE — Progress Notes (Signed)
VASCULAR LAB PRELIMINARY  PRELIMINARY  PRELIMINARY  PRELIMINARY  Carotid duplex completed.    Preliminary report:  Right: severe mixed plaque with acoustic shadowing origin and proximal ICA and ECA.  40-59% ICA stenosis, highest end of scale.  Higher velocities may be obscured secondary to plaquing. Left: moderate to severe calcific plaque origin and proximal ICA with acoustic shadowing.  1-39% ICA stenosis, highest end of scale.  Higher velocities may be obscured secondary to plaquing. Bilateral vertebral artery flow is antegrade.   Arlen Dupuis, RVT 05/14/2015, 5:44 PM

## 2015-05-14 NOTE — Progress Notes (Signed)
Telemetry called Rn to inform her of pt having 6sec pause on telemetry and some beats of Vtach whiles working with PT. Dr. Wendee Beavers on unit notified; no new orders received. Pt remains asymptomatic and will continue to closely monitor.

## 2015-05-14 NOTE — Progress Notes (Signed)
[facility] PROGRESS NOTE  Robert [name] [id number] DOB: 1935-03-22 DOA: 05/13/2015 PCP: Geoffery Lyons, MD  Assessment/Plan: Principal Problem:   CVA (cerebral infarction) - Neurology on board - currently undergoing stroke workup  Active Problems:   Diabetes mellitus ([place]) - Continue diabetic diet, Lantus, sliding scale insulin   ANEMIA - No active bleeding and improved on recheck   GERD - Continue Protonix   Hypertensive urgency - Allow for permissive hypertension   Hyperlipidemia  Code Status: full Family Communication: Discussed with patient and family at bedside Disposition Plan: Pending further workup   Consultants:  Neurology   Antibiotics:  None  HPI/Subjective: Patient has no new complaints. No acute issues overnight  Objective: Filed Vitals:   05/14/15 1000 05/14/15 1503  BP: 148/63 179/62  Pulse: 71 66  Temp:  98.1 F (36.7 C)  Resp: 18 18    Intake/Output Summary (Last 24 hours) at 05/14/15 1602 Last data filed at 05/13/15 1909  Gross per 24 hour  Intake      0 ml  Output    400 ml  Net   -400 ml   Filed Weights   05/13/15 1552  Weight: 81.647 kg (180 lb)    Exam:   General: Patient in no acute distress, alert and awake  Cardiovascular: S1 and S2 within normal limits  Respiratory: No increased work of breathing, no wheezes  Abdomen: Soft, nondistended, nontender  Musculoskeletal: No cyanosis or clubbing  Data Reviewed: Basic Metabolic Panel:  Recent Labs Lab 05/13/15 1629 05/13/15 1633  NA 141 141  K 4.7 4.6  CL 109 109  CO2 22  --   GLUCOSE 272* 262*  BUN 31* 32*  CREATININE 2.60* 2.60*  CALCIUM 8.6*  --    Liver Function Tests:  Recent Labs Lab 05/13/15 1629  AST 21  ALT 15*  ALKPHOS 86  BILITOT 0.5  PROT 5.7*  ALBUMIN 2.6*   No results for input(s): LIPASE, AMYLASE in the last 168 hours. No results for input(s): AMMONIA in the last 168 hours. CBC:  Recent Labs Lab 05/13/15 1629  05/13/15 1633  WBC 4.9  --   NEUTROABS 3.6  --   HGB 11.1* 12.2*  HCT 35.6* 36.0*  MCV 86.6  --   PLT 140*  --    Cardiac Enzymes: No results for input(s): CKTOTAL, CKMB, CKMBINDEX, TROPONINI in the last 168 hours. BNP (last 3 results) No results for input(s): BNP in the last 8760 hours.  ProBNP (last 3 results) No results for input(s): PROBNP in the last 8760 hours.  CBG:  Recent Labs Lab 05/13/15 2328 05/14/15 0443 05/14/15 0926 05/14/15 1143  GLUCAP 213* 157* 149* 136*    Recent Results (from the past 240 hour(s))  Urine culture     Status: None (Preliminary result)   Collection Time: 05/13/15  7:32 PM  Result Value Ref Range Status   Specimen Description URINE, CLEAN CATCH  Final   Special Requests NONE  Final   Culture NO GROWTH < 24 HOURS  Final   Report Status PENDING  Incomplete     Studies: Dg Chest 2 View  05/13/2015  CLINICAL DATA:  aphasia and right arm weakness, nausea and vomiting EXAM: CHEST  2 VIEW COMPARISON:  06/15/2012 FINDINGS: Mild cardiac enlargement. Vascular pattern normal. Mild scarring or atelectasis at both lung bases. No significant parenchymal opacities. IMPRESSION: No active cardiopulmonary disease. Electronically Signed   By: Skipper Cliche M.D.   On: 05/13/2015 19:10   Ct Head  Wo Contrast  05/13/2015  CLINICAL DATA:  Aphasia.  Right upper extremity weakness. EXAM: CT HEAD WITHOUT CONTRAST TECHNIQUE: Contiguous axial images were obtained from the base of the skull through the vertex without intravenous contrast. COMPARISON:  None. FINDINGS: No evidence of parenchymal hemorrhage or extra-axial fluid collection. No mass lesion, mass effect, or midline shift. There is asymmetric hypodensity in the right frontal subcortical and deep white matter. There is questionable focus of loss of gray-white differentiation in the right frontal lobe (series 2/ image 23). Intracranial atherosclerosis. Mild cerebral volume loss. No ventriculomegaly. The  visualized paranasal sinuses are essentially clear. The mastoid air cells are unopacified. No evidence of calvarial fracture. IMPRESSION: 1. No acute intracranial hemorrhage. 2. Asymmetric right frontal subcortical and deep white matter hypodensity, probably due to chronic small vessel ischemia, although there is a small focus of questionable loss of gray-white differentiation in the right frontal lobe, for which an acute right frontal infarct cannot be excluded. Correlate with brain MRI as clinically warranted. 3. Cerebral volume loss, intracranial atherosclerosis and chronic small vessel ischemia in the periventricular white matter. Electronically Signed   By: Ilona Sorrel M.D.   On: 05/13/2015 16:55   Mr Brain Wo Contrast  05/13/2015  CLINICAL DATA:  Aphasia.  Right arm weakness.  Dementia. EXAM: MRI HEAD WITHOUT CONTRAST TECHNIQUE: Multiplanar, multiecho pulse sequences of the brain and surrounding structures were obtained without intravenous contrast. COMPARISON:  CT head today FINDINGS: Small area of acute infarct in the right superior insula. This measures approximately 3 x 5 mm. No other acute infarct. Moderate to advanced atrophy. Chronic infarct right frontal lobe. Chronic microvascular ischemia throughout the white matter bilaterally. Small chronic parietal cortical infarcts bilaterally. Small chronic infarcts in the cerebellum bilaterally. Microvascular ischemic changes in the pons. Chronic micro hemorrhages left medial parietal lobe. No hematoma or fluid collection. Negative for mass or edema. No shift of the midline structures. Normal skullbase.  Circle Jannifer Franklin is patent bilaterally. Mild mucosal edema paranasal sinuses.  Normal appendix. IMPRESSION: Small area of acute infarct right superior insula Moderate to advanced atrophy with chronic ischemic changes as above. Electronically Signed   By: Franchot Gallo M.D.   On: 05/13/2015 20:33   US Renal  05/14/2015  CLINICAL DATA:  Acute renal  insufficiency.  Initial encounter. EXAM: RENAL / URINARY TRACT ULTRASOUND COMPLETE COMPARISON:  None. FINDINGS: Right Kidney: Length: 12.4 cm. Echogenicity within normal limits. No mass or hydronephrosis visualized. Left Kidney: Length: 11.1 cm. Echogenicity within normal limits. No mass or hydronephrosis visualized. Bladder: Appears normal for degree of bladder distention. IMPRESSION: Unremarkable renal ultrasound. Electronically Signed   By: Garald Balding M.D.   On: 05/14/2015 00:45    Scheduled Meds: . aspirin  300 mg Rectal Daily   Or  . aspirin  325 mg Oral Daily  . heparin  5,000 Units Subcutaneous 3 times per day  . insulin aspart  0-9 Units Subcutaneous 6 times per day  . insulin glargine  15 Units Subcutaneous QHS  . levothyroxine  25 mcg Intravenous Daily  . pantoprazole (PROTONIX) IV  40 mg Intravenous QHS   Continuous Infusions: . sodium chloride    . dextrose 5 % and 0.45 % NaCl with KCl 20 mEq/L 100 mL/hr at 05/14/15 1403   Time spent: > 35 minutes  Velvet Bathe  [facility] Pager [phone] If 7PM-7AM, please contact night-coverage at [email or web address], password [facility] 05/14/2015, 4:02 PM  LOS: 1 day

## 2015-05-14 NOTE — Progress Notes (Signed)
Patient received from the ED at 2105, alert, confused, and very aphasic. Patient oriented to room, equipment and safety measures. Cardiac monitoring initiated. Will monitor closely overnight.

## 2015-05-14 NOTE — Progress Notes (Signed)
OT Cancellation Note  Patient Details Name: Robert [name] MRN: [id number] DOB: Jun 04, 1935   Cancelled Treatment:    Reason Eval/Treat Not Completed: Patient at procedure or test/ unavailable (Vascular for doppler and echocardiogram). Will attempt to complete OT evaluation at a later time.   Redmond Baseman, OTR/L Pager: [phone] 05/14/2015, 5:31 PM

## 2015-05-14 NOTE — Progress Notes (Signed)
Pt family requesting to see neurology; PA Shanon Brow paged and awaiting on response; family notified; reported off to oncoming RN. Delia Heady RN

## 2015-05-14 NOTE — Evaluation (Signed)
Physical Therapy Evaluation Patient Details Name: Robert [name] MRN: [id number] DOB: Dec 05, 1935 Today's Date: 05/14/2015   History of Present Illness  Patient is a 80 year old male with diabetes mellitus, insulin-dependent, GERD, hypertension, hyperlipidemia, CAD, hypothyroidism presented from home via EMS for confusion and difficulty speaking. imaging showing Small area of acute infarct right superior insula, Chronic infarct right frontal lobe. Chronic microvascular ischemia  Clinical Impression   Pt admitted with above diagnosis. Pt currently with functional limitations due to the deficits listed below (see PT Problem List).  Pt will benefit from skilled PT to increase their independence and safety with mobility to allow discharge to the venue listed below.    Should be able to dc home with a bit of extra help from his adult children who are local; Will need more assistance in the home for caregiving for his wife; discussed with family, and encouraged them to call Mr. Devall wife's primary caregiver ( I believe it's Dr. Reynaldo Minium) to look into an aide for her in the home   Noted pt with a 6 second pause and brief period of Vtach during amb per RN note; pt asymptomatic for dizziness/syncope.     Follow Up Recommendations Home health PT;Other (comment);Supervision - Intermittent (HHAide for help with caregiving for wife)    Engineer, technical sales;Other (comment) (Optimal AD to be determined in ensuing sessions)    Recommendations for Other Services OT consult     Precautions / Restrictions Precautions Precautions: Fall      Mobility  Bed Mobility Overal bed mobility: Needs Assistance Bed Mobility: Supine to Sit     Supine to sit: Supervision     General bed mobility comments: Noted somewhat dependent on momentum to get up  Transfers Overall transfer level: Needs assistance Equipment used: None Transfers: Sit to/from Stand Sit to Stand: Min guard          General transfer comment: Min guard for safety; Noted use of LUE on bedrail for steadiness  Ambulation/Gait Ambulation/Gait assistance: Min guard;Min assist Ambulation Distance (Feet): 120 Feet Assistive device: None Gait Pattern/deviations: Step-through pattern;Staggering left;Drifts right/left   Gait velocity interpretation: Below normal speed for age/gender General Gait Details: Walked with a few losses of balance to the left, one instance of running into objects on the Left, tending to lose balance when distracted; occasionally requiring min assist to regain balance  Stairs            Wheelchair Mobility    Modified Rankin (Stroke Patients Only) Modified Rankin (Stroke Patients Only) Pre-Morbid Rankin Score: No symptoms Modified Rankin: Moderate disability     Balance Overall balance assessment: Needs assistance           Standing balance-Leahy Scale: Fair Standing balance comment: noted balance deficits with amb                             Pertinent Vitals/Pain Pain Assessment: No/denies pain    Home Living Family/patient expects to be discharged to:: Private residence Living Arrangements: Spouse/significant other (He is a caregiver for his homebound wife) Available Help at Discharge: Family;Available PRN/intermittently (his adult kids check in on him) Type of Home: House Home Access: Stairs to enter Entrance Stairs-Rails: Left Entrance Stairs-Number of Steps: 3 Home Layout: Multi-level;Able to live on main level with bedroom/bathroom        Prior Function Level of Independence: Independent         Comments: Drives; caregiver for wife;  local adult children help with cooking and cleaning     Hand Dominance        Extremity/Trunk Assessment   Upper Extremity Assessment: Defer to OT evaluation           Lower Extremity Assessment: LLE deficits/detail   LLE Deficits / Details: Noted more difficulty and dyscoordination with  Heel-to-Shin than with RLE     Communication   Communication: Other (comment) (seems a bit HOH)  Cognition Arousal/Alertness: Awake/alert Behavior During Therapy: WFL for tasks assessed/performed Overall Cognitive Status: Impaired/Different from baseline Area of Impairment: Safety/judgement         Safety/Judgement: Decreased awareness of deficits     General Comments: It took a few losses of balance with amb for Mr. Pickler to be able to state that he may have difficulty with balance    General Comments General comments (skin integrity, edema, etc.): Discussed recommendations and home situation with pt and with his adult children with pt's permission    Exercises        Assessment/Plan    PT Assessment Patient needs continued PT services  PT Diagnosis Difficulty walking   PT Problem List Decreased activity tolerance;Decreased balance;Decreased mobility;Decreased coordination;Decreased cognition;Decreased knowledge of use of DME;Decreased safety awareness;Decreased knowledge of precautions  PT Treatment Interventions DME instruction;Gait training;Stair training;Functional mobility training;Therapeutic activities;Therapeutic exercise;Patient/family education   PT Goals (Current goals can be found in the Care Plan section) Acute Rehab PT Goals Patient Stated Goal: Hopes to be home soon PT Goal Formulation: With patient Time For Goal Achievement: 05/28/15 Potential to Achieve Goals: Good Additional Goals Additional Goal #1: pt will score greater than 49/56 on Berg to demo decr fall risk    Frequency Min 4X/week   Barriers to discharge        Co-evaluation               End of Session Equipment Utilized During Treatment: Gait belt Activity Tolerance: Patient tolerated treatment well Patient left: in chair;with call bell/phone within reach;with chair alarm set Nurse Communication: Mobility status         Time: [id number] PT Time Calculation (min) (ACUTE  ONLY): 46 min   Charges:   PT Evaluation $PT Eval Moderate Complexity: 1 Procedure PT Treatments $Gait Training: 8-22 mins $Therapeutic Activity: 8-22 mins   PT G Codes:        Quin Hoop 05/14/2015, 1:48 PM  [place], [place]  Acute Rehabilitation Services Pager [phone] Office [phone]

## 2015-05-14 NOTE — Progress Notes (Signed)
STROKE TEAM PROGRESS NOTE   HISTORY OF PRESENT ILLNESS Robert [name] is an 80 y.o. male patient who was brought into the emergency room by his family members for further neurological evaluation. Patient lives with his wife who has severe dementia. His children last spoke to him last night on phone and was last seen physically 48 hours ago. Today when they visited his house, he was found to be unresponsive and not following commands.  Patient unable to provide any history. His wife lives with him, was not available in the ER. family reported that his wife has severe dementia and would not be able to provide any history.   Date last known well: 05/12/2015 Time last known well: Evening sometime tPA Given: No: Outside the time window   SUBJECTIVE (INTERVAL HISTORY) Multiple family members at bedside. Saturday at 9 AM patient reports he got up and got dressed and did some chores around the house. He felt very tired and took a nap. When he woke up his face and hands felt numb. At that time his family found him confused in the living room. He had a headache and didn't feel well. He couldn't answer to his name or date of birth. Patient says he knew family was there but couldn't say all their names. Patient took glucose which was 212. He was transported to the emergency room.   OBJECTIVE Temp:  [98.1 F (36.7 C)-98.8 F (37.1 C)] 98.1 F (36.7 C) (03/19 1503) Pulse Rate:  [66-94] 66 (03/19 1503) Cardiac Rhythm:  [-] Normal sinus rhythm (03/19 0800) Resp:  [18-20] 18 (03/19 1503) BP: (123-189)/(56-87) 179/62 mmHg (03/19 1503) SpO2:  [94 %-98 %] 98 % (03/19 1503)  CBC:   Recent Labs Lab 05/13/15 1629 05/13/15 1633 05/14/15 1829  WBC 4.9  --  4.5  NEUTROABS 3.6  --   --   HGB 11.1* 12.2* 10.6*  HCT 35.6* 36.0* 33.7*  MCV 86.6  --  86.4  PLT 140*  --  132*    Basic Metabolic Panel:   Recent Labs Lab 05/13/15 1629 05/13/15 1633  NA 141 141  K 4.7 4.6  CL 109 109  CO2 22   --   GLUCOSE 272* 262*  BUN 31* 32*  CREATININE 2.60* 2.60*  CALCIUM 8.6*  --     Lipid Panel:     Component Value Date/Time   CHOL 268* 05/14/2015 0502   TRIG 699* 05/14/2015 0502   HDL 30* 05/14/2015 0502   CHOLHDL 8.9 05/14/2015 0502   VLDL UNABLE TO CALCULATE IF TRIGLYCERIDE OVER 400 mg/dL 05/14/2015 0502   LDLCALC UNABLE TO CALCULATE IF TRIGLYCERIDE OVER 400 mg/dL 05/14/2015 0502   HgbA1c:  Lab Results  Component Value Date   HGBA1C * 08/20/2009    8.2 (NOTE)                                                                       According to the ADA Clinical Practice Recommendations for 2011, when HbA1c is used as a screening test:   >=6.5%   Diagnostic of Diabetes Mellitus           (if abnormal result  is confirmed)  5.7-6.4%   Increased risk of developing Diabetes Mellitus  References:Diagnosis and Classification of Diabetes Mellitus,Diabetes [id number] 1):S62-S69 and Standards of Medical Care in         Diabetes - 2011,Diabetes [id number]  (Suppl 1):S11-S61.   Urine Drug Screen:     Component Value Date/Time   LABOPIA NONE DETECTED 05/13/2015 1930   COCAINSCRNUR NONE DETECTED 05/13/2015 1930   LABBENZ NONE DETECTED 05/13/2015 1930   AMPHETMU NONE DETECTED 05/13/2015 1930   THCU NONE DETECTED 05/13/2015 1930   LABBARB NONE DETECTED 05/13/2015 1930      IMAGING  Dg Chest 2 View 05/13/2015   No active cardiopulmonary disease.  Ct Head Wo Contrast 05/13/2015   1. No acute intracranial hemorrhage.  2. Asymmetric right frontal subcortical and deep white matter hypodensity, probably due to chronic small vessel ischemia, although there is a small focus of questionable loss of gray-white differentiation in the right frontal lobe, for which an acute right frontal infarct cannot be excluded. Correlate with brain MRI as clinically warranted.  3. Cerebral volume loss, intracranial atherosclerosis and chronic small vessel ischemia in the periventricular white matter.    Mr Brain Wo Contrast 05/13/2015   Small area of acute infarct right superior insula Moderate to advanced atrophy with chronic ischemic changes as above.     US Renal 05/14/2015   Unremarkable renal ultrasound.   Physical exam: Exam: Gen: NAD, conversant              CV: RRR, no MRG. No Carotid Bruits. No peripheral edema, warm, nontender Eyes: Conjunctivae clear without exudates or hemorrhage  Neuro: Detailed Neurologic Exam  Speech:    Speech is normal; fluent and spontaneous with normal comprehension. Was able to name, repeat, spell world forwards and backwards, and perform serial sevens. Cognition:    The patient is oriented to person, place, and time, month, date and year.    recent and remote memory intact;     language fluent;     normal attention, concentration,  Cranial Nerves:    The pupils are equal, round, and reactive to light. The fundi are normal. Visual fields are full to finger confrontation. Extraocular movements are intact. Trigeminal sensation is intact and the muscles of mastication are normal. The face is symmetric. The palate elevates in the midline. Hearing intact to voice. Voice is normal. Shoulder shrug is normal. The tongue has normal motion without fasciculations.   Coordination:    Normal finger to nose and heel to shin.  Motor Observation:    No asymmetry, no atrophy, and no involuntary movements noted. Tone:    Normal muscle tone.      Strength:    Strength is V/V in the upper and lower limbs.      Sensation: intact to LT     Reflex Exam:  DTR's:    Biceps 2+ bilaterally, right patellar trace, left patellar 2+, absent AJs Toes:    The toes are downgoing bilaterally.   Clonus:    Clonus is absent.       ASSESSMENT/PLAN [name] is a 80 y.o. male with history of Diabetes mellitus, hyperlipidemia, hypertension, and coronary artery disease with previous MI presenting with unresponsiveness. He did not receive IV t-PA due  to late presentation.   Stroke:  Non-dominant infarct secondary to embolic disease.  Resultant  resolution of symptoms  MRI  Small area of acute infarct right superior insula  MRA  Pending. Spoke to patient's nurse 9pm, they will call MRA and try Annie Main.   Carotid Doppler pending  2D  Echo  Pending. Ay need TEE.  LDL unable to calculate secondary to high triglycerides of 699  HgbA1c pending  VTE prophylaxis - subcutaneous heparin Diet Carb Modified Fluid consistency:: Thin; Room service appropriate?: Yes  aspirin 325 mg daily prior to admission, now on aspirin 325 mg daily (unclear if patient taking his medications consistently per daughter)  Patient counseled to be compliant with his antithrombotic medications  Ongoing aggressive stroke risk factor management  Therapy recommendations: Pending  Disposition:  Pending  Hypertension  Stable  Permissive hypertension (OK if < 220/120) but gradually normalize in 5-7 days  Hyperlipidemia  Home meds:  Zocor 20 mg daily not resumed in hospital  LDL unable to calculate, goal < 70  Resume Zocor at 40 mg daily  Continue statin at discharge  Diabetes  HgbA1c pending, goal < 7.0  Uncontrolled (last 8.9 per daughter)  Other Stroke Risk Factors  Advanced age  Cigarette smoker, quit smoking   ETOH use  Coronary artery disease   Other Active Problems  Kidney disease  Anemia  Thrombocytopenia  Severely elevated triglycerides  Hospital day # 1   Personally examined patient and images, and have participated in and made any corrections needed to history, physical, neuro exam,assessment and plan as stated above.  I have personally obtained the history, evaluated lab date, reviewed imaging studies and agree with radiology interpretations.    Sarina Ill, MD Stroke Neurology [facility]       To contact Stroke Continuity provider, please refer to [email or web address] After hours, contact  General Neurology

## 2015-05-15 ENCOUNTER — Encounter (HOSPITAL_COMMUNITY): Payer: Self-pay | Admitting: Nurse Practitioner

## 2015-05-15 ENCOUNTER — Inpatient Hospital Stay (HOSPITAL_COMMUNITY): Payer: Medicare Other

## 2015-05-15 DIAGNOSIS — I63511 Cerebral infarction due to unspecified occlusion or stenosis of right middle cerebral artery: Secondary | ICD-10-CM

## 2015-05-15 DIAGNOSIS — I6789 Other cerebrovascular disease: Secondary | ICD-10-CM

## 2015-05-15 LAB — BASIC METABOLIC PANEL
Anion gap: 14 (ref 5–15)
BUN: 32 mg/dL — AB (ref 6–20)
CO2: 22 mmol/L (ref 22–32)
CREATININE: 3 mg/dL — AB (ref 0.61–1.24)
Calcium: 8.3 mg/dL — ABNORMAL LOW (ref 8.9–10.3)
Chloride: 108 mmol/L (ref 101–111)
GFR calc Af Amer: 22 mL/min — ABNORMAL LOW (ref >60)
GFR, EST NON AFRICAN AMERICAN: 19 mL/min — AB (ref >60)
Glucose, Bld: 253 mg/dL — ABNORMAL HIGH (ref 65–99)
POTASSIUM: 4.7 mmol/L (ref 3.5–5.1)
SODIUM: 144 mmol/L (ref 135–145)

## 2015-05-15 LAB — GLUCOSE, CAPILLARY
GLUCOSE-CAPILLARY: 170 mg/dL — AB (ref 65–99)
GLUCOSE-CAPILLARY: 222 mg/dL — AB (ref 65–99)
GLUCOSE-CAPILLARY: 229 mg/dL — AB (ref 65–99)
Glucose-Capillary: 177 mg/dL — ABNORMAL HIGH (ref 65–99)
Glucose-Capillary: 200 mg/dL — ABNORMAL HIGH (ref 65–99)
Glucose-Capillary: 210 mg/dL — ABNORMAL HIGH (ref 65–99)
Glucose-Capillary: 225 mg/dL — ABNORMAL HIGH (ref 65–99)

## 2015-05-15 LAB — SODIUM, URINE, RANDOM: Sodium, Ur: 80 mmol/L

## 2015-05-15 LAB — ECHOCARDIOGRAM COMPLETE
Height: 72 in
Weight: 2880 oz

## 2015-05-15 LAB — URINE CULTURE: Culture: NO GROWTH

## 2015-05-15 LAB — HEMOGLOBIN A1C
Hgb A1c MFr Bld: 8.8 % — ABNORMAL HIGH (ref 4.8–5.6)
Mean Plasma Glucose: 206 mg/dL

## 2015-05-15 LAB — CREATININE, URINE, RANDOM: Creatinine, Urine: 147.2 mg/dL

## 2015-05-15 MED ORDER — LEVOTHYROXINE SODIUM 25 MCG PO TABS
25.0000 ug | ORAL_TABLET | Freq: Every day | ORAL | Status: DC
  Administered 2015-05-16 – 2015-05-17 (×2): 25 ug via ORAL
  Filled 2015-05-15 (×2): qty 1

## 2015-05-15 MED ORDER — PANTOPRAZOLE SODIUM 40 MG PO TBEC
40.0000 mg | DELAYED_RELEASE_TABLET | Freq: Every day | ORAL | Status: DC
  Administered 2015-05-15 – 2015-05-16 (×2): 40 mg via ORAL
  Filled 2015-05-15 (×2): qty 1

## 2015-05-15 NOTE — Progress Notes (Signed)
Inpatient Diabetes Program Recommendations  AACE/ADA: New Consensus Statement on Inpatient Glycemic Control (2015)  Target Ranges:  Prepandial:   less than 140 mg/dL      Peak postprandial:   less than 180 mg/dL (1-2 hours)      Critically ill patients:  140 - 180 mg/dL   Review of Glycemic Control  Note basal insulin NOT given last night due to medication not being on the floor. Glucose will trend upward. Please consider adjusting when pt gets basal insulin to Daily so he can get dose this am.   Thanks,  Tama Headings RN, MSN, [place] Coordinator Team Pager [phone] (8a-5p)

## 2015-05-15 NOTE — Progress Notes (Signed)
Speech Language Pathology Treatment: Dysphagia  Patient Details Name: MASANORI CAVELL MRN: [id number] DOB: [birth year] Today's Date: 05/15/2015 Time: [id number] SLP Time Calculation (min) (ACUTE ONLY): 14 min  Assessment / Plan / Recommendation Clinical Impression  Pt with baseline dysphagia characterized by occasional coughing with solids more than liquids per his statement today.  He did not recall dysphagia symptoms prompting dilatation in 2011 but daughter reports he complained of sensing food lodging.  Symptoms improved significantly since dilatation per pt/daughter and have not recurred significantly.   SLP observed pt consuming breakfast including pancakes, bacon and coffee.   He eats at rapid rate and demonstrates occasional throat clearing/cough.  Advised him to slow rate of eating to diminish symptoms as much as able.  Pt reports swallowing to be the same as prior to this CVA.  Provided heimlich maneuver in writing for pt/daughter.  Recommend continue diet (regular/thin) with precautions.  No follow up regarding swallowing indicated.   SLP    HPI HPI: Patient is a 80 year old male with diabetes mellitus, insulin-dependent, GERD, hypertension, hyperlipidemia, CAD, hypothyroidism presented from home via EMS for confusion and difficulty speaking.  Pt found to have a right superior insula CVA, chronic right frontal CVA and left medial parietal hemorrhages.  He has h/o dysphagia requiring dilatation, GERD, HTN, DM.  Bedside evaluation completed and follow up indicated as well as SLE.       SLP Plan  Other (Comment) (no swallowing follow up indicated)     Recommendations  Diet recommendations: Regular;Thin liquid Liquids provided via: Cup;Straw Medication Administration: Whole meds with liquid Supervision: Patient able to self feed Compensations: Slow rate;Small sips/bites Postural Changes and/or Swallow Maneuvers: Out of bed for meals;Upright 30-60 min after meal;Seated upright 90  degrees             Follow up Recommendations: Outpatient SLP Plan: Other (Comment) (no swallowing follow up indicated)     [place], [place] [facility] SLP [phone]

## 2015-05-15 NOTE — Progress Notes (Addendum)
STROKE TEAM PROGRESS NOTE   HISTORY OF PRESENT ILLNESS Robert [name] is an 80 y.o. male patient who was brought into the emergency room by his family members for further neurological evaluation. Patient lives with his wife who has severe dementia. His children last spoke to him last night on phone and was last seen physically 48 hours ago. Today when they visited his house, he was found to be unresponsive and not following commands.  Patient unable to provide any history. His wife lives with him, was not available in the ER. family reported that his wife has severe dementia and would not be able to provide any history.   Date last known well: 05/12/2015 Time last known well: Evening sometime tPA Given: No: Outside the time window   SUBJECTIVE (INTERVAL HISTORY) Daughter is at bedside. Saturday at 9 AM patient reports he got up and got dressed and did some chores around the house. He felt very tired and took a nap. When he woke up his face and hands felt numb. At that time his family found him confused in the living room. He had a headache and didn't feel well. He couldn't answer to his name or date of birth. Patient says he knew family was there but couldn't say all their names. Patient took glucose which was 212.    OBJECTIVE Temp:  [97.6 F (36.4 C)-97.9 F (36.6 C)] 97.7 F (36.5 C) (03/20 1816) Pulse Rate:  [69-82] 82 (03/20 1816) Cardiac Rhythm:  [-] Normal sinus rhythm (03/20 1900) Resp:  [16-20] 19 (03/20 1816) BP: (162-177)/(60-81) 177/81 mmHg (03/20 1816) SpO2:  [97 %-98 %] 98 % (03/20 1816)  CBC:   Recent Labs Lab 05/13/15 1629 05/13/15 1633 05/14/15 1829  WBC 4.9  --  4.5  NEUTROABS 3.6  --   --   HGB 11.1* 12.2* 10.6*  HCT 35.6* 36.0* 33.7*  MCV 86.6  --  86.4  PLT 140*  --  132*    Basic Metabolic Panel:   Recent Labs Lab 05/14/15 1829 05/15/15 1200  NA 141 144  K 4.2 4.7  CL 110 108  CO2 21* 22  GLUCOSE 248* 253*  BUN 31* 32*  CREATININE  2.76* 3.00*  CALCIUM 8.3* 8.3*    Lipid Panel:     Component Value Date/Time   CHOL 268* 05/14/2015 0502   TRIG 699* 05/14/2015 0502   HDL 30* 05/14/2015 0502   CHOLHDL 8.9 05/14/2015 0502   VLDL UNABLE TO CALCULATE IF TRIGLYCERIDE OVER 400 mg/dL 05/14/2015 0502   LDLCALC UNABLE TO CALCULATE IF TRIGLYCERIDE OVER 400 mg/dL 05/14/2015 0502   HgbA1c:  Lab Results  Component Value Date   HGBA1C 8.8* 05/14/2015   Urine Drug Screen:     Component Value Date/Time   LABOPIA NONE DETECTED 05/13/2015 1930   COCAINSCRNUR NONE DETECTED 05/13/2015 1930   LABBENZ NONE DETECTED 05/13/2015 1930   AMPHETMU NONE DETECTED 05/13/2015 1930   THCU NONE DETECTED 05/13/2015 1930   LABBARB NONE DETECTED 05/13/2015 1930      IMAGING  Dg Chest 2 View 05/13/2015   No active cardiopulmonary disease.  Ct Head Wo Contrast 05/13/2015   1. No acute intracranial hemorrhage.  2. Asymmetric right frontal subcortical and deep white matter hypodensity, probably due to chronic small vessel ischemia, although there is a small focus of questionable loss of gray-white differentiation in the right frontal lobe, for which an acute right frontal infarct cannot be excluded. Correlate with brain MRI as clinically warranted.  3.  Cerebral volume loss, intracranial atherosclerosis and chronic small vessel ischemia in the periventricular white matter.   Mr Brain Wo Contrast 05/13/2015   Small area of acute infarct right superior insula Moderate to advanced atrophy with chronic ischemic changes as above.     US Renal 05/14/2015   Unremarkable renal ultrasound.   Physical exam: Exam: Gen: NAD, conversant              CV: RRR, no MRG. No Carotid Bruits. No peripheral edema, warm, nontender Eyes: Conjunctivae clear without exudates or hemorrhage  Neuro: Detailed Neurologic Exam  Speech:    Speech is normal; fluent and spontaneous with normal comprehension. Was able to name, repeat, spell world forwards and  backwards, and perform serial sevens. Cognition:    The patient is oriented to person, place, and time, month, date and year.    recent and remote memory intact;     language fluent;     normal attention, concentration,  Cranial Nerves:    The pupils are equal, round, and reactive to light. The fundi are normal. Visual fields are full to finger confrontation. Extraocular movements are intact. Trigeminal sensation is intact and the muscles of mastication are normal. The face is symmetric. The palate elevates in the midline. Hearing intact to voice. Voice is normal. Shoulder shrug is normal. The tongue has normal motion without fasciculations.   Coordination:    Normal finger to nose and heel to shin.  Motor Observation:    No asymmetry, no atrophy, and no involuntary movements noted. Tone:    Normal muscle tone.      Strength:    Strength is V/V in the upper and lower limbs.      Sensation: intact to LT     Reflex Exam:  DTR's:    Biceps 2+ bilaterally, right patellar trace, left patellar 2+, absent AJs Toes:    The toes are downgoing bilaterally.   Clonus:    Clonus is absent.       ASSESSMENT/PLAN [name] is a 80 y.o. male with history of Diabetes mellitus, hyperlipidemia, hypertension, and coronary artery disease with previous MI presenting with unresponsiveness. He did not receive IV t-PA due to late presentation.   Stroke:  Non-dominant infarct secondary to embolic etiology source unknown  Resultant  resolution of symptoms  MRI  Small area of acute infarct right superior insula  MRA  no large vessel stenosis in the brain. MRA neck limited due to lack of contrast and motion artefact  Carotid Doppler  Severe plaque with 40-59% right ICA and 1-39% left ICA stenosis 2D Echo  LVEF 60-65%, moderate LVH, normal wall motion, diastolic  dysfunction and elevated LV filling pressure, RVH with normal RV   function. .  LDL unable to calculate secondary to  high triglycerides of 699  HgbA1c 8.8  VTE prophylaxis - subcutaneous heparin Diet Carb Modified Fluid consistency:: Thin; Room service appropriate?: Yes  aspirin 325 mg daily prior to admission, now on aspirin 325 mg daily (unclear if patient taking his medications consistently per daughter)  Patient counseled to be compliant with his antithrombotic medications  Ongoing aggressive stroke risk factor management  Therapy recommendations: Pending  Disposition:  Pending  Hypertension  Stable  Permissive hypertension (OK if < 220/120) but gradually normalize in 5-7 days  Hyperlipidemia  Home meds:  Zocor 20 mg daily not resumed in hospital  LDL unable to calculate, goal < 70  Resume Zocor at 40 mg daily  Continue statin  at discharge  Diabetes  HgbA1c 8.8, goal < 7.0  Uncontrolled (last 8.9 per daughter)  Other Stroke Risk Factors  Advanced age  Cigarette smoker, quit smoking   ETOH use  Coronary artery disease   Other Active Problems  Kidney disease  Anemia  Thrombocytopenia  Severely elevated triglycerides  Hospital day # 2   Personally examined patient and images, and have participated in and made any corrections needed to history, physical, neuro exam,assessment and plan as stated above.  I have personally obtained the history, evaluated lab date, reviewed imaging studies and agree with radiology interpretations.   Stroke team will sign off. Kindly follow-up as outpatient in stroke clinic in 2 months. Kindly call for questions.  Antony Contras, MD Stroke Neurology [facility]       To contact Stroke Continuity provider, please refer to [email or web address] After hours, contact General Neurology

## 2015-05-15 NOTE — Progress Notes (Signed)
[facility] PROGRESS NOTE  MAVRYCK COGBURN [id number] DOB: 1935-04-21 DOA: 05/13/2015 PCP: Geoffery Lyons, MD  Assessment/Plan: Principal Problem:   CVA (cerebral infarction) - Neurology on board - currently undergoing stroke workup once cleared for d/c will start disposition planning.  Active Problems:   Diabetes mellitus ([place]) - Continue diabetic diet, Lantus, sliding scale insulin    ANEMIA - No active bleeding and improved on recheck    GERD - Continue Protonix    Hypertensive urgency - Allow for permissive hypertension    Hyperlipidemia - continue zocor  Code Status: full Family Communication: Discussed with patient and family at bedside Disposition Plan: Pending final recommendations from neurology   Consultants:  Neurology   Antibiotics:  None  HPI/Subjective: Patient has no new complaints. No acute issues overnight  Objective: Filed Vitals:   05/15/15 0858 05/15/15 1420  BP: 174/68 167/66  Pulse: 75 72  Temp: 97.6 F (36.4 C) 97.9 F (36.6 C)  Resp: 20 16   No intake or output data in the 24 hours ending 05/15/15 1641 Filed Weights   05/13/15 1552  Weight: 81.647 kg (180 lb)    Exam:   General: Patient in no acute distress, alert and awake  Cardiovascular: S1 and S2 within normal limits  Respiratory: No increased work of breathing, no wheezes  Abdomen: Soft, nondistended, nontender  Musculoskeletal: No cyanosis or clubbing  Data Reviewed: Basic Metabolic Panel:  Recent Labs Lab 05/13/15 1629 05/13/15 1633 05/14/15 1829 05/15/15 1200  NA 141 141 141 144  K 4.7 4.6 4.2 4.7  CL 109 109 110 108  CO2 22  --  21* 22  GLUCOSE 272* 262* 248* 253*  BUN 31* 32* 31* 32*  CREATININE 2.60* 2.60* 2.76* 3.00*  CALCIUM 8.6*  --  8.3* 8.3*   Liver Function Tests:  Recent Labs Lab 05/13/15 1629 05/14/15 1829  AST 21 18  ALT 15* 14*  ALKPHOS 86 76  BILITOT 0.5 0.4  PROT 5.7* 5.4*  ALBUMIN 2.6* 2.3*   No results  for input(s): LIPASE, AMYLASE in the last 168 hours. No results for input(s): AMMONIA in the last 168 hours. CBC:  Recent Labs Lab 05/13/15 1629 05/13/15 1633 05/14/15 1829  WBC 4.9  --  4.5  NEUTROABS 3.6  --   --   HGB 11.1* 12.2* 10.6*  HCT 35.6* 36.0* 33.7*  MCV 86.6  --  86.4  PLT 140*  --  132*   Cardiac Enzymes: No results for input(s): CKTOTAL, CKMB, CKMBINDEX, TROPONINI in the last 168 hours. BNP (last 3 results) No results for input(s): BNP in the last 8760 hours.  ProBNP (last 3 results) No results for input(s): PROBNP in the last 8760 hours.  CBG:  Recent Labs Lab 05/14/15 1639 05/15/15 0012 05/15/15 0406 05/15/15 0808 05/15/15 1130  GLUCAP 199* 222* 210* 177* 229*    Recent Results (from the past 240 hour(s))  Urine culture     Status: None   Collection Time: 05/13/15  7:32 PM  Result Value Ref Range Status   Specimen Description URINE, CLEAN CATCH  Final   Special Requests NONE  Final   Culture NO GROWTH 2 DAYS  Final   Report Status 05/15/2015 FINAL  Final     Studies: Dg Chest 2 View  05/13/2015  CLINICAL DATA:  aphasia and right arm weakness, nausea and vomiting EXAM: CHEST  2 VIEW COMPARISON:  06/15/2012 FINDINGS: Mild cardiac enlargement. Vascular pattern normal. Mild scarring or atelectasis at both lung bases.  No significant parenchymal opacities. IMPRESSION: No active cardiopulmonary disease. Electronically Signed   By: Skipper Cliche M.D.   On: 05/13/2015 19:10   Mr Jodene Nam Head Wo Contrast  05/15/2015  CLINICAL DATA:  Initial evaluation for acute stroke. EXAM: MRA HEAD WITHOUT CONTRAST MRA NECK WITHOUT CONTRAST TECHNIQUE: Angiographic images of the Circle of Willis were obtained using MRA technique without intravenous contrast. Angiographic images of the neck were obtained using MRA technique without intravenous contrast. Carotid stenosis measurements (when applicable) are obtained utilizing NASCET criteria, using the distal internal carotid  diameter as the denominator. COMPARISON:  Prior MRI from 05/13/2015. FINDINGS: MRA HEAD FINDINGS ANTERIOR CIRCULATION: Visualized distal cervical segment of the internal carotid arteries are patent with antegrade flow. Petrous segments widely patent. Mild scattered atheromatous irregularity within the cavernous/supraclinoid ICAs without high-grade stenosis. A1 segments and anterior cerebral arteries opacified bilaterally. M1 segments patent without high-grade stenosis or occlusion. MCA bifurcations normal. No proximal M2 branch occlusion. Distal MCA branches not well evaluated on this exam, but appear to be somewhat attenuated on the right. POSTERIOR CIRCULATION: Vertebral arteries patent to the vertebrobasilar junction. Left vertebral artery dominant. Left posterior inferior cerebellar artery grossly patent. Right posterior inferior cerebral artery not well visualized. Basilar artery patent to its distal aspect. Superior cerebellar arteries patent bilaterally. Posterior cerebral arteries arise knee basilar artery and are opacified to their distal aspects. MRA NECK FINDINGS Study extremely limited due to lack of IV contrast and motion artifact. Aortic arch grossly of normal caliber. Evaluation for possible stenosis at the origin of the great vessels fairly limited on this exam. Right common carotid artery not well evaluated proximally. Right common carotid artery patent to the bifurcation. Right bifurcation not well evaluated on this exam due to motion. Right ICA patent to the skullbase without obvious flow limiting stenosis. Left common carotid artery not well evaluated proximally. Left common carotid artery patent to the bifurcation. Left bifurcation not well evaluated due to motion. Left ICA in grossly patent to the skullbase. Vertebral arteries not well evaluated proximally. Left vertebral artery is dominant. Vertebral arteries are grossly patent with antegrade flow to the skullbase. IMPRESSION: MRA HEAD  IMPRESSION: 1. No large or proximal arterial branch occlusion within the intracranial circulation. No high-grade or correctable stenosis. 2. Apparent attenuation of the distal right MCA and branches as compared to the left. MRA NECK IMPRESSION: Extremely limited study due to lack of IV contrast and motion artifact. No obvious flow-limiting or critical stenosis within the neck. Particularly, the carotid bifurcations are poorly evaluated due to motion artifact on this exam. Electronically Signed   By: Jeannine Boga M.D.   On: 05/15/2015 04:31   Mr Angiogram Neck Wo Contrast  05/15/2015  CLINICAL DATA:  Initial evaluation for acute stroke. EXAM: MRA HEAD WITHOUT CONTRAST MRA NECK WITHOUT CONTRAST TECHNIQUE: Angiographic images of the Circle of Willis were obtained using MRA technique without intravenous contrast. Angiographic images of the neck were obtained using MRA technique without intravenous contrast. Carotid stenosis measurements (when applicable) are obtained utilizing NASCET criteria, using the distal internal carotid diameter as the denominator. COMPARISON:  Prior MRI from 05/13/2015. FINDINGS: MRA HEAD FINDINGS ANTERIOR CIRCULATION: Visualized distal cervical segment of the internal carotid arteries are patent with antegrade flow. Petrous segments widely patent. Mild scattered atheromatous irregularity within the cavernous/supraclinoid ICAs without high-grade stenosis. A1 segments and anterior cerebral arteries opacified bilaterally. M1 segments patent without high-grade stenosis or occlusion. MCA bifurcations normal. No proximal M2 branch occlusion. Distal MCA branches not well evaluated  on this exam, but appear to be somewhat attenuated on the right. POSTERIOR CIRCULATION: Vertebral arteries patent to the vertebrobasilar junction. Left vertebral artery dominant. Left posterior inferior cerebellar artery grossly patent. Right posterior inferior cerebral artery not well visualized. Basilar artery  patent to its distal aspect. Superior cerebellar arteries patent bilaterally. Posterior cerebral arteries arise knee basilar artery and are opacified to their distal aspects. MRA NECK FINDINGS Study extremely limited due to lack of IV contrast and motion artifact. Aortic arch grossly of normal caliber. Evaluation for possible stenosis at the origin of the great vessels fairly limited on this exam. Right common carotid artery not well evaluated proximally. Right common carotid artery patent to the bifurcation. Right bifurcation not well evaluated on this exam due to motion. Right ICA patent to the skullbase without obvious flow limiting stenosis. Left common carotid artery not well evaluated proximally. Left common carotid artery patent to the bifurcation. Left bifurcation not well evaluated due to motion. Left ICA in grossly patent to the skullbase. Vertebral arteries not well evaluated proximally. Left vertebral artery is dominant. Vertebral arteries are grossly patent with antegrade flow to the skullbase. IMPRESSION: MRA HEAD IMPRESSION: 1. No large or proximal arterial branch occlusion within the intracranial circulation. No high-grade or correctable stenosis. 2. Apparent attenuation of the distal right MCA and branches as compared to the left. MRA NECK IMPRESSION: Extremely limited study due to lack of IV contrast and motion artifact. No obvious flow-limiting or critical stenosis within the neck. Particularly, the carotid bifurcations are poorly evaluated due to motion artifact on this exam. Electronically Signed   By: Jeannine Boga M.D.   On: 05/15/2015 04:31   Mr Brain Wo Contrast  05/13/2015  CLINICAL DATA:  Aphasia.  Right arm weakness.  Dementia. EXAM: MRI HEAD WITHOUT CONTRAST TECHNIQUE: Multiplanar, multiecho pulse sequences of the brain and surrounding structures were obtained without intravenous contrast. COMPARISON:  CT head today FINDINGS: Small area of acute infarct in the right superior  insula. This measures approximately 3 x 5 mm. No other acute infarct. Moderate to advanced atrophy. Chronic infarct right frontal lobe. Chronic microvascular ischemia throughout the white matter bilaterally. Small chronic parietal cortical infarcts bilaterally. Small chronic infarcts in the cerebellum bilaterally. Microvascular ischemic changes in the pons. Chronic micro hemorrhages left medial parietal lobe. No hematoma or fluid collection. Negative for mass or edema. No shift of the midline structures. Normal skullbase.  Circle Jannifer Franklin is patent bilaterally. Mild mucosal edema paranasal sinuses.  Normal appendix. IMPRESSION: Small area of acute infarct right superior insula Moderate to advanced atrophy with chronic ischemic changes as above. Electronically Signed   By: Franchot Gallo M.D.   On: 05/13/2015 20:33   US Renal  05/14/2015  CLINICAL DATA:  Acute renal insufficiency.  Initial encounter. EXAM: RENAL / URINARY TRACT ULTRASOUND COMPLETE COMPARISON:  None. FINDINGS: Right Kidney: Length: 12.4 cm. Echogenicity within normal limits. No mass or hydronephrosis visualized. Left Kidney: Length: 11.1 cm. Echogenicity within normal limits. No mass or hydronephrosis visualized. Bladder: Appears normal for degree of bladder distention. IMPRESSION: Unremarkable renal ultrasound. Electronically Signed   By: Garald Balding M.D.   On: 05/14/2015 00:45    Scheduled Meds: . aspirin  300 mg Rectal Daily   Or  . aspirin  325 mg Oral Daily  . heparin  5,000 Units Subcutaneous 3 times per day  . insulin aspart  0-9 Units Subcutaneous 6 times per day  . insulin glargine  15 Units Subcutaneous QHS  . [START ON  05/16/2015] levothyroxine  25 mcg Oral QAC breakfast  . pantoprazole  40 mg Oral Q supper  . simvastatin  40 mg Oral q1800   Continuous Infusions: . sodium chloride     Time spent: > 35 minutes  Velvet Bathe  [facility] Pager [phone] If 7PM-7AM, please contact night-coverage at [email or web address],  password [facility] 05/15/2015, 4:41 PM  LOS: 2 days

## 2015-05-15 NOTE — Progress Notes (Signed)
Patients daughter stayed with patient over night MRA done, results pending

## 2015-05-15 NOTE — Progress Notes (Signed)
  Echocardiogram 2D Echocardiogram has been performed.  Tresa Res 05/15/2015, 12:43 PM

## 2015-05-15 NOTE — Progress Notes (Signed)
Physical Therapy Treatment Patient Details Name: Robert [name] MRN: [id number] DOB: [birth year] Today's Date: 05/15/2015    History of Present Illness Patient is a 80 year old male with diabetes mellitus, MI, cataract, insulin-dependent, GERD, hypertension, hyperlipidemia, CAD, hypothyroidism presented from home via EMS for confusion and difficulty speaking. imaging showing Small area of acute infarct right superior insula, Chronic infarct right frontal lobe. Chronic microvascular ischemia    PT Comments    Pt progressing towards physical therapy goals. Was able to perform transfers and ambulation with general supervision for safety. Appears to have improved balance this session. Discussed d/c recommendations with pt and adult daughter who was present during session. PT continues to feel that pt should not be physically assisting wife at home. Although his balance is improved this session, he is still at a high risk of falls if he is assisting his wife with mobility.   Follow Up Recommendations  Home health PT     Equipment Recommendations  3in1 (PT)    Recommendations for Other Services       Precautions / Restrictions Precautions Precautions: Fall Restrictions Weight Bearing Restrictions: No    Mobility  Bed Mobility Overal bed mobility: Modified Independent Bed Mobility: Supine to Sit           General bed mobility comments: No assist required. No use of bed rails.  Transfers Overall transfer level: Needs assistance Equipment used: None Transfers: Sit to/from Stand Sit to Stand: Supervision         General transfer comment: No assist required. General supervision for safety.   Ambulation/Gait Ambulation/Gait assistance: Min guard;Min assist Ambulation Distance (Feet): 400 Feet Assistive device: None Gait Pattern/deviations: Step-through pattern;Decreased stride length;Trunk flexed Gait velocity: Decreased Gait velocity interpretation: Below normal speed  for age/gender General Gait Details: Slight drifting to the L noted occasionally. Pt was generally able to stay in the middle of the hall when cued with very minimal drifting noted. Pt was given a few higher level balance tasks and required assist for stepping over objects.    Stairs Stairs: Yes Stairs assistance: Supervision Stair Management: One rail Left;Alternating pattern;Forwards Number of Stairs: 4 General stair comments: Supervision for safety. No assist required.   Wheelchair Mobility    Modified Rankin (Stroke Patients Only) Modified Rankin (Stroke Patients Only) Pre-Morbid Rankin Score: No symptoms Modified Rankin: Moderate disability     Balance Overall balance assessment: Needs assistance                           High level balance activites: Direction changes;Turns;Head turns;Other (comment) (Simulated stepping over high objects) High Level Balance Comments: Min assist required occasionally for head turns. Frequently for stepping over objects.     Cognition Arousal/Alertness: Awake/alert Behavior During Therapy: WFL for tasks assessed/performed Overall Cognitive Status: Within Functional Limits for tasks assessed           Safety/Judgement: Decreased awareness of deficits          Exercises      General Comments General comments (skin integrity, edema, etc.): Discussed recommendations and home situation with pt and with his adult children with pt's permission      Pertinent Vitals/Pain Pain Assessment: No/denies pain    Home Living                      Prior Function            PT Goals (current goals can now be  found in the care plan section) Acute Rehab PT Goals Patient Stated Goal: not stated PT Goal Formulation: With patient Time For Goal Achievement: 05/28/15 Potential to Achieve Goals: Good Progress towards PT goals: Progressing toward goals    Frequency  Min 4X/week    PT Plan Current plan remains  appropriate    Co-evaluation             End of Session Equipment Utilized During Treatment: Gait belt Activity Tolerance: Patient tolerated treatment well Patient left: in chair;with call bell/phone within reach;with chair alarm set     Time: [id number] PT Time Calculation (min) (ACUTE ONLY): 26 min  Charges:  $Gait Training: 23-37 mins                    G Codes:      Rolinda Roan May 31, 2015, 3:02 PM   Rolinda Roan, PT, DPT Acute Rehabilitation Services Pager: [phone]

## 2015-05-15 NOTE — Progress Notes (Signed)
    [facility] has been requested to perform a transesophageal echocardiogram on 03/21 for CVA.  After careful review of history and examination, the risks and benefits of transesophageal echocardiogram have been explained including risks of esophageal damage, perforation (1:10,000 risk), bleeding, pharyngeal hematoma as well as other potential complications associated with conscious sedation including aspiration, arrhythmia, respiratory failure and death. Alternatives to treatment were discussed, questions were answered. Patient is willing to proceed.   Lenoard Aden 05/15/2015 3:23 PM

## 2015-05-15 NOTE — Evaluation (Signed)
Speech Language Pathology Evaluation Patient Details Name: Robert [name] MRN: [id number] DOB: 07/24/1935 Today's Date: 05/15/2015 Time: [id number] SLP Time Calculation (min) (ACUTE ONLY): 35 min  Problem List:  Patient Active Problem List   Diagnosis Date Noted  . Acute right MCA stroke ([place])   . CVA (cerebral infarction) 05/13/2015  . Acute encephalopathy 05/13/2015  . Hypertensive urgency 05/13/2015  . Hyperlipidemia 05/13/2015  . Diabetes mellitus ([place]) 08/18/2009  . ANEMIA 08/18/2009  . HYPERTENSION 08/18/2009  . ALLERGIC RHINITIS, SEASONAL 08/18/2009  . PARAPHARYNGEAL ABSCESS 08/18/2009  . GERD 08/18/2009  . HIATAL HERNIA 08/18/2009  . DIVERTICULOSIS, COLON 08/18/2009  . 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 ARTERIOVENOUS MALFORMATION, COLON 08/18/2009  . DYSPHAGIA 08/18/2009  . COLONIC POLYPS, HX OF 08/18/2009  . RENAL CALCULUS, HX OF 08/18/2009  . ESOPHAGEAL STRICTURE 06/21/2009   Past Medical History:  Past Medical History  Diagnosis Date  . Allergy     year round  . Cataract   . Diabetes mellitus   . GERD (gastroesophageal reflux disease)   . Hyperlipidemia   . Hypertension   . Myocardial infarction ([place]) 2011  . Thyroid disease     hypothyroid  . Hearing deficit    Past Surgical History:  Past Surgical History  Procedure Laterality Date  . Stents    . Coronary angioplasty with stent placement  07/2009    3 stents  . Insulin pump needs to be removed for mri     HPI:  Patient is a 80 year old male with diabetes mellitus, insulin-dependent, GERD, hypertension, hyperlipidemia, CAD, hypothyroidism presented from home via EMS for confusion and difficulty speaking.  Pt found to have a right superior insula CVA, chronic right frontal CVA and left medial parietal hemorrhages.  He has h/o dysphagia requiring dilatation, GERD, HTN, DM.  Bedside evaluation completed and follow up indicated as well as SLE.    Assessment / Plan /  Recommendation Clinical Impression  Pt presents with cognitive linguistic deficits primarily characterized by delay in expressive communication due to word finding deficits.  Pt answers direct questions with short sentence level responses.   Repetition and automatic speech tasks intact.  His ability to follow directions (complex) is excellent. Daughter present during evaluation and reports pt with expressive language delays.   Given h/o neurological diagnoses - ? if pt may have some baseline difficulties.   As pt manages home duties, cooking, medicine, bills, etc - further diagnostic testing indicated.      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 [employer] Vocation: Retired   Doctor, hospital Level: Oriented to person;Disoriented to situation (pt stated "I don't know" to findings, daughter Mickel Baas confirmed CVA) Awareness: Impaired Awareness Impairment: Intellectual impairment (decreased awareness to expressive language deficits)    Comprehension  Auditory Comprehension Overall Auditory Comprehension: Appears within functional limits for tasks assessed Yes/No Questions: Not tested Commands: Within Functional Limits (even for multiprocess commands) Conversation: Simple Other Conversation Comments: delayed processing speed Interfering Components: Hearing EffectiveTechniques: Increased volume Visual Recognition/Discrimination Discrimination: Not tested Reading Comprehension Reading Status:  (for basic sentence, answering  question - pt required cues)    Expression Expression Primary Mode of Expression: Verbal Verbal Expression Initiation: No impairment Automatic Speech: Month of year Repetition: No impairment (for complex sentence) Naming: Not tested Pragmatics: No impairment (appears with flat affect) Non-Verbal Means of Communication: Not applicable Written Expression Dominant Hand: Right Written Expression: Not tested   Oral / Motor  Oral Motor/Sensory Function Overall Oral Motor/Sensory Function: Within functional limits Motor Speech Overall Motor Speech: Appears within functional limits for tasks assessed Respiration: Within functional limits Resonance: Within functional limits Articulation: Within functional limitis Intelligibility: Intelligible Motor Planning: Witnin functional limits Motor Speech Errors: Not applicable   [place], [facility] SLP [phone]

## 2015-05-15 NOTE — Consult Note (Addendum)
  ELECTROPHYSIOLOGY CONSULT NOTE  Patient ID: Robert [name] MRN: [id number], DOB/AGE: 10/16/1935   Admit date: 05/13/2015 Date of Consult: 05/16/2015  Primary Physician: ARONSON,RICHARD A, MD Primary Cardiologist: [place]  Reason for Consultation: Cryptogenic stroke; recommendations regarding Implantable Loop Recorder  History of Present Illness Robert [name] was admitted on 05/13/2015 with unresponsiveness. Imaging demonstrated non dominant infarct 2/2 embolic disease of unknown source.  He has undergone workup for stroke including echocardiogram and MRA of neck.  The patient has been monitored on telemetry which has demonstrated sinus rhythm with NSVT but no AF.  Inpatient stroke work-up is to be completed with a TEE.   Echocardiogram this admission demonstrated EF 55-60%, grade 1 diastolic dysfunction, RVH, LA 36.   Prior to admission, the patient denies chest pain, shortness of breath, dizziness, palpitations, or syncope.  They are recovering from their stroke with plans to return home at discharge.  EP has been asked to evaluate for placement of an implantable loop recorder to monitor for atrial fibrillation.  Past Medical History  Diagnosis Date  . Allergy     year round  . Cataract   . Diabetes mellitus   . GERD (gastroesophageal reflux disease)   . Hyperlipidemia   . Hypertension   . Myocardial infarction ([facility]) 2011  . Thyroid disease     hypothyroid  . Hearing deficit   . Stroke (cerebrum) ([facility])      Surgical History:  Past Surgical History  Procedure Laterality Date  . Stents    . Coronary angioplasty with stent placement  07/2009    3 stents  . Insulin pump needs to be removed for mri       Prescriptions prior to admission  Medication Sig Dispense Refill Last Dose  . albuterol (PROVENTIL HFA;VENTOLIN HFA) 108 (90 Base) MCG/ACT inhaler Inhale 2 puffs into the lungs every 6 (six) hours as needed for wheezing or shortness of breath.   unknown  . aspirin 325  MG EC tablet Take 325 mg by mouth daily.   unknown  . Blood Glucose Calibration (ACCU-CHEK AVIVA) SOLN    unknown  . fenofibrate (TRICOR) 145 MG tablet Take 1 tablet by mouth Daily.   unknown  . fexofenadine (ALLEGRA) 180 MG tablet Take 180 mg by mouth daily.   unknown  . HUMALOG 100 UNIT/ML injection Inject 30 Units into the skin daily.   unknown  . Lancets (ACCU-CHEK MULTICLIX) lancets    unknown  . levothyroxine (SYNTHROID, LEVOTHROID) 50 MCG tablet Take 1 tablet by mouth daily.   unknown  . metoprolol succinate (TOPROL-XL) 50 MG 24 hr tablet Take 1 tablet by mouth Daily.   unknown  . Multiple Vitamins-Minerals (ANTIOXIDANT FORMULA SG) capsule Take 1 capsule by mouth daily.   unknown  . Multiple Vitamins-Minerals (MULTIVITAMIN WITH MINERALS) tablet Take 1 tablet by mouth daily.   unknown  . omeprazole (PRILOSEC) 20 MG capsule Take 1 capsule by mouth Daily.   unknown  . simvastatin (ZOCOR) 20 MG tablet Take 1 tablet by mouth Daily.   unknown  . ACCU-CHEK AVIVA PLUS test strip    06/19/2011  . amLODipine (NORVASC) 5 MG tablet Take 1 tablet by mouth Daily. Reported on 05/13/2015   Not Taking at Unknown time  . LANTUS SOLOSTAR 100 UNIT/ML injection Inject 30 Units into the skin Daily. Reported on 05/13/2015   Not Taking at Unknown time  . metFORMIN (GLUCOPHAGE) 500 MG tablet Take 1 tablet by mouth Twice daily. Reported on 05/13/2015     Not Taking at Unknown time  . peg 3350 powder (MOVIPREP) SOLR MOVI PREP take as directed (Patient not taking: Reported on 05/13/2015) 1 kit 0 Not Taking at Unknown time  . pioglitazone (ACTOS) 45 MG tablet Take 1 tablet by mouth Daily. Reported on 05/13/2015   Not Taking at Unknown time    Inpatient Medications:  . [MAR Hold] aspirin  300 mg Rectal Daily   Or  . [MAR Hold] aspirin  325 mg Oral Daily  . [MAR Hold] heparin  5,000 Units Subcutaneous 3 times per day  . [MAR Hold] insulin aspart  0-9 Units Subcutaneous 6 times per day  . [MAR Hold] insulin glargine  15  Units Subcutaneous QHS  . [MAR Hold] levothyroxine  25 mcg Oral QAC breakfast  . [MAR Hold] pantoprazole  40 mg Oral Q supper  . [MAR Hold] simvastatin  40 mg Oral q1800    Allergies:  Allergies  Allergen Reactions  . Lisinopril     "made me lose my voice"    Social History   Social History  . Marital Status: Married    Spouse Name: N/A  . Number of Children: N/A  . Years of Education: N/A   Occupational History  . Not on file.   Social History Main Topics  . Smoking status: Former Smoker  . Smokeless tobacco: Never Used  . Alcohol Use: 1.2 oz/week    2 Glasses of wine per week  . Drug Use: No  . Sexual Activity: Not on file   Other Topics Concern  . Not on file   Social History Narrative    FH- HTN   Review of Systems: All other systems reviewed and are otherwise negative except as noted above.  Physical Exam: Filed Vitals:   05/15/15 1816 05/15/15 2140 05/16/15 0131 05/16/15 0531  BP: 177/81 195/88 166/56 183/96  Pulse: 82 79 76 79  Temp: 97.7 F (36.5 C) 97.4 F (36.3 C) 97.9 F (36.6 C) 98.5 F (36.9 C)  TempSrc: Oral Oral Oral Oral  Resp: 19 20 20 20  Height:      Weight:      SpO2: 98% 99% 97% 99%    GEN- The patient is well appearing, alert and oriented x 3 today.   Head- normocephalic, atraumatic Eyes-  Sclera clear, conjunctiva pink Ears- hearing intact Oropharynx- clear Neck- supple Lungs- Clear to ausculation bilaterally, normal work of breathing Heart- Regular rate and rhythm, no murmurs, rubs or gallops  GI- soft, NT, ND, + BS Extremities- no clubbing, cyanosis, or edema MS- no significant deformity or atrophy Skin- no rash or lesion Psych- euthymic mood, full affect   Labs:   Lab Results  Component Value Date   WBC 4.5 05/14/2015   HGB 10.6* 05/14/2015   HCT 33.7* 05/14/2015   MCV 86.4 05/14/2015   PLT 132* 05/14/2015     Recent Labs Lab 05/14/15 1829 05/15/15 1200  NA 141 144  K 4.2 4.7  CL 110 108  CO2 21* 22   BUN 31* 32*  CREATININE 2.76* 3.00*  CALCIUM 8.3* 8.3*  PROT 5.4*  --   BILITOT 0.4  --   ALKPHOS 76  --   ALT 14*  --   AST 18  --   GLUCOSE 248* 253*    Radiology/Studies: Dg Chest 2 View 05/13/2015  CLINICAL DATA:  aphasia and right arm weakness, nausea and vomiting EXAM: CHEST  2 VIEW COMPARISON:  06/15/2012 FINDINGS: Mild cardiac enlargement. Vascular pattern normal. Mild   scarring or atelectasis at both lung bases. No significant parenchymal opacities. IMPRESSION: No active cardiopulmonary disease. Electronically Signed   By: Raymond  Rubner M.D.   On: 05/13/2015 19:10   Ct Head Wo Contrast 05/13/2015  CLINICAL DATA:  Aphasia.  Right upper extremity weakness. EXAM: CT HEAD WITHOUT CONTRAST TECHNIQUE: Contiguous axial images were obtained from the base of the skull through the vertex without intravenous contrast. COMPARISON:  None. FINDINGS: No evidence of parenchymal hemorrhage or extra-axial fluid collection. No mass lesion, mass effect, or midline shift. There is asymmetric hypodensity in the right frontal subcortical and deep white matter. There is questionable focus of loss of gray-white differentiation in the right frontal lobe (series 2/ image 23). Intracranial atherosclerosis. Mild cerebral volume loss. No ventriculomegaly. The visualized paranasal sinuses are essentially clear. The mastoid air cells are unopacified. No evidence of calvarial fracture. IMPRESSION: 1. No acute intracranial hemorrhage. 2. Asymmetric right frontal subcortical and deep white matter hypodensity, probably due to chronic small vessel ischemia, although there is a small focus of questionable loss of gray-white differentiation in the right frontal lobe, for which an acute right frontal infarct cannot be excluded. Correlate with brain MRI as clinically warranted. 3. Cerebral volume loss, intracranial atherosclerosis and chronic small vessel ischemia in the periventricular white matter. Electronically Signed   By: Jason  A Poff M.D.   On: 05/13/2015 16:55   Mr Mra Head Wo Contrast 05/15/2015  CLINICAL DATA:  Initial evaluation for acute stroke. EXAM: MRA HEAD WITHOUT CONTRAST MRA NECK WITHOUT CONTRAST TECHNIQUE: Angiographic images of the Circle of Willis were obtained using MRA technique without intravenous contrast. Angiographic images of the neck were obtained using MRA technique without intravenous contrast. Carotid stenosis measurements (when applicable) are obtained utilizing NASCET criteria, using the distal internal carotid diameter as the denominator. COMPARISON:  Prior MRI from 05/13/2015. FINDINGS: MRA HEAD FINDINGS ANTERIOR CIRCULATION: Visualized distal cervical segment of the internal carotid arteries are patent with antegrade flow. Petrous segments widely patent. Mild scattered atheromatous irregularity within the cavernous/supraclinoid ICAs without high-grade stenosis. A1 segments and anterior cerebral arteries opacified bilaterally. M1 segments patent without high-grade stenosis or occlusion. MCA bifurcations normal. No proximal M2 branch occlusion. Distal MCA branches not well evaluated on this exam, but appear to be somewhat attenuated on the right. POSTERIOR CIRCULATION: Vertebral arteries patent to the vertebrobasilar junction. Left vertebral artery dominant. Left posterior inferior cerebellar artery grossly patent. Right posterior inferior cerebral artery not well visualized. Basilar artery patent to its distal aspect. Superior cerebellar arteries patent bilaterally. Posterior cerebral arteries arise knee basilar artery and are opacified to their distal aspects. MRA NECK FINDINGS Study extremely limited due to lack of IV contrast and motion artifact. Aortic arch grossly of normal caliber. Evaluation for possible stenosis at the origin of the great vessels fairly limited on this exam. Right common carotid artery not well evaluated proximally. Right common carotid artery patent to the bifurcation. Right  bifurcation not well evaluated on this exam due to motion. Right ICA patent to the skullbase without obvious flow limiting stenosis. Left common carotid artery not well evaluated proximally. Left common carotid artery patent to the bifurcation. Left bifurcation not well evaluated due to motion. Left ICA in grossly patent to the skullbase. Vertebral arteries not well evaluated proximally. Left vertebral artery is dominant. Vertebral arteries are grossly patent with antegrade flow to the skullbase. IMPRESSION: MRA HEAD IMPRESSION: 1. No large or proximal arterial branch occlusion within the intracranial circulation. No high-grade or correctable stenosis. 2. Apparent   attenuation of the distal right MCA and branches as compared to the left. MRA NECK IMPRESSION: Extremely limited study due to lack of IV contrast and motion artifact. No obvious flow-limiting or critical stenosis within the neck. Particularly, the carotid bifurcations are poorly evaluated due to motion artifact on this exam. Electronically Signed   By: Benjamin  McClintock M.D.   On: 05/15/2015 04:31   12-lead ECG sinus rhythm, rate 87, poor R wave progression   Telemetry sinus rhythm, brief runs NSVT   Assessment and Plan:  1. Cryptogenic stroke The patient presents with cryptogenic stroke.  The patient has a TEE planned for this AM.  I spoke at length with the patient about monitoring for afib with either a 30 day event monitor or an implantable loop recorder.  Risks, benefits, and alteratives to implantable loop recorder were discussed with the patient today.   At this time, the patient is very clear in their decision to proceed with implantable loop recorder.   Wound care was reviewed with the patient (keep incision clean and dry for 3 days).    2.  NSVT Asymptomatic  LV normal by echo Add low dose BB when ok with neurology   Please call with questions.   Seiler,Amber K, NP 05/16/2015 7:44 AM   I have seen, examined the  patient, and reviewed the above assessment and plan.  On exam, RRR.  Changes to above are made where necessary.  Risks, benefits, and alternatives to ILR placement were discussed at length with the patient who wishes to proceed if TEE is unremarkable.  Co Sign: Juron Vorhees, MD 05/16/2015 8:00 AM  Addendum  PFO noted on TEE.  Dopplers reveal no lower extremity DVT.  Will proceed with ILR implantation.  Shonica Weier MD, FACC 05/16/2015 11:48 AM   

## 2015-05-15 NOTE — Evaluation (Addendum)
Occupational Therapy Evaluation Patient Details Name: Robert [name] MRN: [id number] DOB: March 23, 1935 Today's Date: 05/15/2015    History of Present Illness Patient is a 80 year old male with diabetes mellitus, MI, cataract, insulin-dependent, GERD, hypertension, hyperlipidemia, CAD, hypothyroidism presented from home via EMS for confusion and difficulty speaking. imaging showing Small area of acute infarct right superior insula, Chronic infarct right frontal lobe. Chronic microvascular ischemia   Clinical Impression   Pt admitted with above. Pt independent with ADLs, PTA. Feel pt will benefit from acute OT to increase independence prior to d/c. Recommending HHOT.    Follow Up Recommendations  Home health OT;Supervision - Intermittent    Equipment Recommendations  None recommended by OT    Recommendations for Other Services       Precautions / Restrictions Precautions Precautions: Fall Restrictions Weight Bearing Restrictions: No      Mobility Bed Mobility Overal bed mobility: Modified Independent                Transfers Overall transfer level: Needs assistance   Transfers: Sit to/from Stand Sit to Stand: Supervision              Balance      LOB with single leg stance when simulating functional activity when trying not to use UE support-assist given.                                      ADL Overall ADL's : Needs assistance/impaired             Lower Body Bathing: Set up;Sit to/from stand;Supervison/ safety Lower Body Bathing Details (indicate cue type and reason): simulated task standing-LOB without UE support-assist given. Better with UE supported while standing;  setup and supervision level for LB bathing with sit to stand transfer technique.          Toilet Transfer: Min guard;Ambulation (sit to stand from bed)           Functional mobility during ADLs: Min guard for ambulation-drifting to left with  ambulation General ADL Comments: Recommended sitting for LB bathing.      Vision Pt wears glasses most of the time; reports no change from baseline Vision Assessment?: Yes Visual Fields: No apparent deficits   Perception     Praxis      Pertinent Vitals/Pain Pain Assessment: No/denies pain at end of session     Hand Dominance   Extremity/Trunk Assessment Upper Extremity Assessment Upper Extremity Assessment: Generalized weakness;LUE deficits/detail LUE Coordination: decreased fine motor;decreased gross motor   Lower Extremity Assessment Lower Extremity Assessment: Defer to PT evaluation       Communication Communication Communication: HOH   Cognition Arousal/Alertness: Awake/alert Behavior During Therapy: WFL for tasks assessed/performed Overall Cognitive Status: Within Functional Limits for tasks assessed                     General Comments   edema in LUE-instructed to elevate it and positioned it at end of session.    Exercises       Shoulder Instructions      Home Living Family/patient expects to be discharged to:: Private residence Living Arrangements: Spouse/significant other (caregiver for his wife) Available Help at Discharge: Family;Available PRN/intermittently (his adult kids check in on him) Type of Home: House Home Access: Stairs to enter [employer] of Steps: 3 Entrance Stairs-Rails: Left Home Layout: Multi-level;Able to live on main level with  bedroom/bathroom   Alternate Level Stairs-Rails: Right Bathroom Shower/Tub: Occupational psychologist: Standard     Home Equipment: Shower seat - built in      Lives With: Spouse (takes care of his wife)    Prior Functioning/Environment Level of Independence: Independent             OT Diagnosis: Generalized weakness   OT Problem List: Decreased strength;Decreased coordination;Decreased knowledge of use of DME or AE;Increased edema;Impaired balance (sitting and/or  standing);Decreased activity tolerance;Decreased range of motion   OT Treatment/Interventions: DME and/or AE instruction;Balance training;Patient/family education;Therapeutic activities;Therapeutic exercise;Self-care/ADL training;Energy conservation    OT Goals(Current goals can be found in the care plan section) Acute Rehab OT Goals Patient Stated Goal: not stated OT Goal Formulation: With patient Time For Goal Achievement: 05/22/15 Potential to Achieve Goals: Good ADL Goals Pt Will Perform Lower Body Dressing: with supervision;sit to/from stand (including gathering items) Pt Will Transfer to Toilet: with modified independence;ambulating;regular height toilet Pt Will Perform Tub/Shower Transfer: Shower transfer;ambulating;shower seat;with set-up;with supervision Additional ADL Goal #1: Pt will independently perform HEP for LUE to increase coordination.  OT Frequency: Min 2X/week   Barriers to D/C:            Co-evaluation              End of Session Equipment Utilized During Treatment: Gait belt  Activity Tolerance: Patient tolerated treatment well Patient left: in bed;with call bell/phone within reach;with bed alarm set;with family/visitor present   Time: 1004-1024 (subtracted 14 minutes from time as MD/PA? Came in to talk with pt/family) OT Time Calculation (min): 20 min Charges:  OT General Charges $OT Visit: 1 Procedure OT Evaluation $OT Eval Moderate Complexity: 1 Procedure G-CodesBenito Mccreedy OTR/L [id number] 05/15/2015, 11:09 AM

## 2015-05-16 ENCOUNTER — Encounter (HOSPITAL_COMMUNITY)
Admission: EM | Disposition: A | Discharge status: Home Health | Payer: Self-pay | Source: Home / Self Care | Attending: Family Medicine

## 2015-05-16 ENCOUNTER — Inpatient Hospital Stay (HOSPITAL_COMMUNITY): Payer: Medicare Other

## 2015-05-16 ENCOUNTER — Encounter (HOSPITAL_COMMUNITY): Payer: Self-pay | Admitting: *Deleted

## 2015-05-16 DIAGNOSIS — I340 Nonrheumatic mitral (valve) insufficiency: Secondary | ICD-10-CM

## 2015-05-16 DIAGNOSIS — I639 Cerebral infarction, unspecified: Secondary | ICD-10-CM

## 2015-05-16 HISTORY — PX: EP IMPLANTABLE DEVICE: SHX172B

## 2015-05-16 HISTORY — PX: TEE WITHOUT CARDIOVERSION: SHX5443

## 2015-05-16 LAB — GLUCOSE, CAPILLARY
GLUCOSE-CAPILLARY: 145 mg/dL — AB (ref 65–99)
GLUCOSE-CAPILLARY: 195 mg/dL — AB (ref 65–99)
GLUCOSE-CAPILLARY: 217 mg/dL — AB (ref 65–99)
GLUCOSE-CAPILLARY: 267 mg/dL — AB (ref 65–99)
Glucose-Capillary: 247 mg/dL — ABNORMAL HIGH (ref 65–99)
Glucose-Capillary: 249 mg/dL — ABNORMAL HIGH (ref 65–99)

## 2015-05-16 SURGERY — ECHOCARDIOGRAM, TRANSESOPHAGEAL
Anesthesia: Moderate Sedation

## 2015-05-16 SURGERY — LOOP RECORDER INSERTION

## 2015-05-16 MED ORDER — LABETALOL HCL 5 MG/ML IV SOLN
INTRAVENOUS | Status: DC | PRN
  Administered 2015-05-16: 10 mg via INTRAVENOUS

## 2015-05-16 MED ORDER — MIDAZOLAM HCL 10 MG/2ML IJ SOLN
INTRAMUSCULAR | Status: DC | PRN
  Administered 2015-05-16: 1 mg via INTRAVENOUS
  Administered 2015-05-16 (×2): 2 mg via INTRAVENOUS

## 2015-05-16 MED ORDER — LIDOCAINE-EPINEPHRINE 1 %-1:100000 IJ SOLN
INTRAMUSCULAR | Status: AC
  Filled 2015-05-16: qty 1

## 2015-05-16 MED ORDER — HYDRALAZINE HCL 20 MG/ML IJ SOLN
INTRAMUSCULAR | Status: AC
  Filled 2015-05-16: qty 1

## 2015-05-16 MED ORDER — FENTANYL CITRATE (PF) 100 MCG/2ML IJ SOLN
INTRAMUSCULAR | Status: DC | PRN
  Administered 2015-05-16: 25 ug via INTRAVENOUS
  Administered 2015-05-16: 50 ug via INTRAVENOUS

## 2015-05-16 MED ORDER — MIDAZOLAM HCL 5 MG/ML IJ SOLN
INTRAMUSCULAR | Status: AC
  Filled 2015-05-16: qty 2

## 2015-05-16 MED ORDER — DIPHENHYDRAMINE HCL 50 MG/ML IJ SOLN
INTRAMUSCULAR | Status: AC
  Filled 2015-05-16: qty 1

## 2015-05-16 MED ORDER — LABETALOL HCL 5 MG/ML IV SOLN
INTRAVENOUS | Status: AC
  Filled 2015-05-16: qty 4

## 2015-05-16 MED ORDER — HYDRALAZINE HCL 20 MG/ML IJ SOLN
INTRAMUSCULAR | Status: DC | PRN
  Administered 2015-05-16 (×2): 5 mg via INTRAVENOUS

## 2015-05-16 MED ORDER — FENTANYL CITRATE (PF) 100 MCG/2ML IJ SOLN
INTRAMUSCULAR | Status: AC
  Filled 2015-05-16: qty 2

## 2015-05-16 MED ORDER — LIDOCAINE-EPINEPHRINE 1 %-1:100000 IJ SOLN
INTRAMUSCULAR | Status: DC | PRN
  Administered 2015-05-16: 10 mL via INTRADERMAL

## 2015-05-16 SURGICAL SUPPLY — 2 items
LOOP REVEAL LINQSYS (Prosthesis & Implant Heart) ×3 IMPLANT
PACK LOOP INSERTION (CUSTOM PROCEDURE TRAY) ×3 IMPLANT

## 2015-05-16 NOTE — Interval H&P Note (Signed)
History and Physical Interval Note:  05/16/2015 11:48 AM  Robert [name]  has presented today for surgery, with the diagnosis of syncope  The various methods of treatment have been discussed with the patient and family. After consideration of risks, benefits and other options for treatment, the patient has consented to  Procedure(s): Loop Recorder Insertion (N/A) as a surgical intervention .  The patient's history has been reviewed, patient examined, no change in status, stable for surgery.  I have reviewed the patient's chart and labs.  Questions were answered to the patient's satisfaction.     Thompson Grayer

## 2015-05-16 NOTE — Progress Notes (Signed)
*  PRELIMINARY RESULTS* Vascular Ultrasound Lower extremity venous duplex has been completed.  Preliminary findings: No evidence of DVT or baker's cyst.   Landry Mellow, RDMS, RVT  05/16/2015, 11:07 AM

## 2015-05-16 NOTE — H&P (View-Only) (Signed)
ELECTROPHYSIOLOGY CONSULT NOTE  Patient ID: Robert [name] MRN: [id number], DOB/AGE: [birth year]   Admit date: 05/13/2015 Date of Consult: 05/16/2015  Primary Physician: Geoffery Lyons, MD Primary Cardiologist: Wynonia Lawman  Reason for Consultation: Cryptogenic stroke; recommendations regarding Implantable Loop Recorder  History of Present Illness Robert [name] was admitted on 05/13/2015 with unresponsiveness. Imaging demonstrated non dominant infarct 2/2 embolic disease of unknown source.  He has undergone workup for stroke including echocardiogram and MRA of neck.  The patient has been monitored on telemetry which has demonstrated sinus rhythm with NSVT but no AF.  Inpatient stroke work-up is to be completed with a TEE.   Echocardiogram this admission demonstrated EF 68-12%, grade 1 diastolic dysfunction, RVH, LA 36.   Prior to admission, the patient denies chest pain, shortness of breath, dizziness, palpitations, or syncope.  They are recovering from their stroke with plans to return home at discharge.  EP has been asked to evaluate for placement of an implantable loop recorder to monitor for atrial fibrillation.  Past Medical History  Diagnosis Date  . Allergy     year round  . Cataract   . Diabetes mellitus   . GERD (gastroesophageal reflux disease)   . Hyperlipidemia   . Hypertension   . Myocardial infarction ([place]) 2011  . Thyroid disease     hypothyroid  . Hearing deficit   . Stroke (cerebrum) [facility])      Surgical History:  Past Surgical History  Procedure Laterality Date  . Stents    . Coronary angioplasty with stent placement  07/2009    3 stents  . Insulin pump needs to be removed for mri       Prescriptions prior to admission  Medication Sig Dispense Refill Last Dose  . albuterol (PROVENTIL HFA;VENTOLIN HFA) 108 (90 Base) MCG/ACT inhaler Inhale 2 puffs into the lungs every 6 (six) hours as needed for wheezing or shortness of breath.   unknown  . aspirin 325  MG EC tablet Take 325 mg by mouth daily.   unknown  . Blood Glucose Calibration (ACCU-CHEK AVIVA) SOLN    unknown  . fenofibrate (TRICOR) 145 MG tablet Take 1 tablet by mouth Daily.   unknown  . fexofenadine (ALLEGRA) 180 MG tablet Take 180 mg by mouth daily.   unknown  . HUMALOG 100 UNIT/ML injection Inject 30 Units into the skin daily.   unknown  . Lancets (ACCU-CHEK MULTICLIX) lancets    unknown  . levothyroxine (SYNTHROID, LEVOTHROID) 50 MCG tablet Take 1 tablet by mouth daily.   unknown  . metoprolol succinate (TOPROL-XL) 50 MG 24 hr tablet Take 1 tablet by mouth Daily.   unknown  . Multiple Vitamins-Minerals (ANTIOXIDANT FORMULA SG) capsule Take 1 capsule by mouth daily.   unknown  . Multiple Vitamins-Minerals (MULTIVITAMIN WITH MINERALS) tablet Take 1 tablet by mouth daily.   unknown  . omeprazole (PRILOSEC) 20 MG capsule Take 1 capsule by mouth Daily.   unknown  . simvastatin (ZOCOR) 20 MG tablet Take 1 tablet by mouth Daily.   unknown  . ACCU-CHEK AVIVA PLUS test strip    06/19/2011  . amLODipine (NORVASC) 5 MG tablet Take 1 tablet by mouth Daily. Reported on 05/13/2015   Not Taking at Unknown time  . LANTUS SOLOSTAR 100 UNIT/ML injection Inject 30 Units into the skin Daily. Reported on 05/13/2015   Not Taking at Unknown time  . metFORMIN (GLUCOPHAGE) 500 MG tablet Take 1 tablet by mouth Twice daily. Reported on 05/13/2015  Not Taking at Unknown time  . peg 3350 powder (MOVIPREP) SOLR MOVI PREP take as directed (Patient not taking: Reported on 05/13/2015) 1 kit 0 Not Taking at Unknown time  . pioglitazone (ACTOS) 45 MG tablet Take 1 tablet by mouth Daily. Reported on 05/13/2015   Not Taking at Unknown time    Inpatient Medications:  . [MAR Hold] aspirin  300 mg Rectal Daily   Or  . [MAR Hold] aspirin  325 mg Oral Daily  . [MAR Hold] heparin  5,000 Units Subcutaneous 3 times per day  . [MAR Hold] insulin aspart  0-9 Units Subcutaneous 6 times per day  . [MAR Hold] insulin glargine  15  Units Subcutaneous QHS  . [MAR Hold] levothyroxine  25 mcg Oral QAC breakfast  . [MAR Hold] pantoprazole  40 mg Oral Q supper  . [MAR Hold] simvastatin  40 mg Oral q1800    Allergies:  Allergies  Allergen Reactions  . Lisinopril     "made me lose my voice"    Social History   Social History  . Marital Status: Married    Spouse Name: N/A  . Number of Children: N/A  . Years of Education: N/A   Occupational History  . Not on file.   Social History Main Topics  . Smoking status: Former Research scientist (life sciences)  . Smokeless tobacco: Never Used  . Alcohol Use: 1.2 oz/week    2 Glasses of wine per week  . Drug Use: No  . Sexual Activity: Not on file   Other Topics Concern  . Not on file   Social History Narrative    FH- HTN   Review of Systems: All other systems reviewed and are otherwise negative except as noted above.  Physical Exam: Filed Vitals:   05/15/15 1816 05/15/15 2140 05/16/15 0131 05/16/15 0531  BP: 177/81 195/88 166/56 183/96  Pulse: 82 79 76 79  Temp: 97.7 F (36.5 C) 97.4 F (36.3 C) 97.9 F (36.6 C) 98.5 F (36.9 C)  TempSrc: Oral Oral Oral Oral  Resp: _0 Height:      Weight:      SpO2: 98% 99% 97% 99%    GEN- The patient is well appearing, alert and oriented x 3 today.   Head- normocephalic, atraumatic Eyes-  Sclera clear, conjunctiva pink Ears- hearing intact Oropharynx- clear Neck- supple Lungs- Clear to ausculation bilaterally, normal work of breathing Heart- Regular rate and rhythm, no murmurs, rubs or gallops  GI- soft, NT, ND, + BS Extremities- no clubbing, cyanosis, or edema MS- no significant deformity or atrophy Skin- no rash or lesion Psych- euthymic mood, full affect   Labs:   Lab Results  Component Value Date   WBC 4.5 05/14/2015   HGB 10.6* 05/14/2015   HCT 33.7* 05/14/2015   MCV 86.4 05/14/2015   PLT 132* 05/14/2015     Recent Labs Lab 05/14/15 1829 05/15/15 1200  NA 141 144  K 4.2 4.7  CL 110 108  CO2 21* 22   BUN 31* 32*  CREATININE 2.76* 3.00*  CALCIUM 8.3* 8.3*  PROT 5.4*  --   BILITOT 0.4  --   ALKPHOS 76  --   ALT 14*  --   AST 18  --   GLUCOSE 248* 253*    Radiology/Studies: Dg Chest 2 View 05/13/2015  CLINICAL DATA:  aphasia and right arm weakness, nausea and vomiting EXAM: CHEST  2 VIEW COMPARISON:  06/15/2012 FINDINGS: Mild cardiac enlargement. Vascular pattern normal. Mild  scarring or atelectasis at both lung bases. No significant parenchymal opacities. IMPRESSION: No active cardiopulmonary disease. Electronically Signed   By: Skipper Cliche M.D.   On: 05/13/2015 19:10   Ct Head Wo Contrast 05/13/2015  CLINICAL DATA:  Aphasia.  Right upper extremity weakness. EXAM: CT HEAD WITHOUT CONTRAST TECHNIQUE: Contiguous axial images were obtained from the base of the skull through the vertex without intravenous contrast. COMPARISON:  None. FINDINGS: No evidence of parenchymal hemorrhage or extra-axial fluid collection. No mass lesion, mass effect, or midline shift. There is asymmetric hypodensity in the right frontal subcortical and deep white matter. There is questionable focus of loss of gray-white differentiation in the right frontal lobe (series 2/ image 23). Intracranial atherosclerosis. Mild cerebral volume loss. No ventriculomegaly. The visualized paranasal sinuses are essentially clear. The mastoid air cells are unopacified. No evidence of calvarial fracture. IMPRESSION: 1. No acute intracranial hemorrhage. 2. Asymmetric right frontal subcortical and deep white matter hypodensity, probably due to chronic small vessel ischemia, although there is a small focus of questionable loss of gray-white differentiation in the right frontal lobe, for which an acute right frontal infarct cannot be excluded. Correlate with brain MRI as clinically warranted. 3. Cerebral volume loss, intracranial atherosclerosis and chronic small vessel ischemia in the periventricular white matter. Electronically Signed   By: Ilona Sorrel M.D.   On: 05/13/2015 16:55   Mr Jodene Nam Head Wo Contrast 05/15/2015  CLINICAL DATA:  Initial evaluation for acute stroke. EXAM: MRA HEAD WITHOUT CONTRAST MRA NECK WITHOUT CONTRAST TECHNIQUE: Angiographic images of the Circle of Willis were obtained using MRA technique without intravenous contrast. Angiographic images of the neck were obtained using MRA technique without intravenous contrast. Carotid stenosis measurements (when applicable) are obtained utilizing NASCET criteria, using the distal internal carotid diameter as the denominator. COMPARISON:  Prior MRI from 05/13/2015. FINDINGS: MRA HEAD FINDINGS ANTERIOR CIRCULATION: Visualized distal cervical segment of the internal carotid arteries are patent with antegrade flow. Petrous segments widely patent. Mild scattered atheromatous irregularity within the cavernous/supraclinoid ICAs without high-grade stenosis. A1 segments and anterior cerebral arteries opacified bilaterally. M1 segments patent without high-grade stenosis or occlusion. MCA bifurcations normal. No proximal M2 branch occlusion. Distal MCA branches not well evaluated on this exam, but appear to be somewhat attenuated on the right. POSTERIOR CIRCULATION: Vertebral arteries patent to the vertebrobasilar junction. Left vertebral artery dominant. Left posterior inferior cerebellar artery grossly patent. Right posterior inferior cerebral artery not well visualized. Basilar artery patent to its distal aspect. Superior cerebellar arteries patent bilaterally. Posterior cerebral arteries arise knee basilar artery and are opacified to their distal aspects. MRA NECK FINDINGS Study extremely limited due to lack of IV contrast and motion artifact. Aortic arch grossly of normal caliber. Evaluation for possible stenosis at the origin of the great vessels fairly limited on this exam. Right common carotid artery not well evaluated proximally. Right common carotid artery patent to the bifurcation. Right  bifurcation not well evaluated on this exam due to motion. Right ICA patent to the skullbase without obvious flow limiting stenosis. Left common carotid artery not well evaluated proximally. Left common carotid artery patent to the bifurcation. Left bifurcation not well evaluated due to motion. Left ICA in grossly patent to the skullbase. Vertebral arteries not well evaluated proximally. Left vertebral artery is dominant. Vertebral arteries are grossly patent with antegrade flow to the skullbase. IMPRESSION: MRA HEAD IMPRESSION: 1. No large or proximal arterial branch occlusion within the intracranial circulation. No high-grade or correctable stenosis. 2. Apparent  attenuation of the distal right MCA and branches as compared to the left. MRA NECK IMPRESSION: Extremely limited study due to lack of IV contrast and motion artifact. No obvious flow-limiting or critical stenosis within the neck. Particularly, the carotid bifurcations are poorly evaluated due to motion artifact on this exam. Electronically Signed   By: Jeannine Boga M.D.   On: 05/15/2015 04:31   12-lead ECG sinus rhythm, rate 87, poor R wave progression   Telemetry sinus rhythm, brief runs NSVT   Assessment and Plan:  1. Cryptogenic stroke The patient presents with cryptogenic stroke.  The patient has a TEE planned for this AM.  I spoke at length with the patient about monitoring for afib with either a 30 day event monitor or an implantable loop recorder.  Risks, benefits, and alteratives to implantable loop recorder were discussed with the patient today.   At this time, the patient is very clear in their decision to proceed with implantable loop recorder.   Wound care was reviewed with the patient (keep incision clean and dry for 3 days).    2.  NSVT Asymptomatic  LV normal by echo Add low dose BB when ok with neurology   Please call with questions.   Patsey Berthold, NP 05/16/2015 7:44 AM   I have seen, examined the  patient, and reviewed the above assessment and plan.  On exam, RRR.  Changes to above are made where necessary.  Risks, benefits, and alternatives to ILR placement were discussed at length with the patient who wishes to proceed if TEE is unremarkable.  Co Sign: Thompson Grayer, MD 05/16/2015 8:00 AM  Addendum  PFO noted on TEE.  Dopplers reveal no lower extremity DVT.  Will proceed with ILR implantation.  Thompson Grayer MD, [facility] 05/16/2015 11:48 AM

## 2015-05-16 NOTE — Progress Notes (Signed)
  Echocardiogram Echocardiogram Transesophageal has been performed.  Darlina Sicilian M 05/16/2015, 9:26 AM

## 2015-05-16 NOTE — Clinical Documentation Improvement (Signed)
Hospitalist  (Please document query responses in the current medical record, not on the CDI BPA form.  Thank you.)  If know or able to determine, please clarify if the patient has:  - Acute Kidney Injury on Stage 4 CKD this admission  - Acute  Kidney Injury  - Stage 4 CKD  - Other condition  - Unable to clinically determine  Clinical Information: "Acute kidney injury versus chronic kidney disease: The patient may have underlying chronic kidney disease due to hypertensive and diabetic nephropathy. Follow UA, culture, urine lites, renal ultrasound, gentle hydration." documented in the H&P  BUN/Cr/GFR trend this admission   (white male) Component     Latest Ref Rng 05/13/2015 05/13/2015 05/14/2015 05/15/2015         4:29 PM  4:33 PM    BUN     6 - 20 mg/dL 31 (H) 32 (H) 31 (H) 32 (H)  Creatinine     0.61 - 1.24 mg/dL 2.60 (H) 2.60 (H) 2.76 (H) 3.00 (H)  EGFR (Non-African Amer.)     >60 mL/min 22 (L)  21 (L) 19 (L)    Please exercise your independent, professional judgment when responding. A specific answer is not anticipated or expected.   Thank You, Erling Conte  RN BSN CCDS [phone] Health Information Management [place]

## 2015-05-16 NOTE — H&P (View-Only) (Signed)
STROKE TEAM PROGRESS NOTE   HISTORY OF PRESENT ILLNESS Robert [name] is an 80 y.o. male patient who was brought into the emergency room by his family members for further neurological evaluation. Patient lives with his wife who has severe dementia. His children last spoke to him last night on phone and was last seen physically 48 hours ago. Today when they visited his house, he was found to be unresponsive and not following commands.  Patient unable to provide any history. His wife lives with him, was not available in the ER. family reported that his wife has severe dementia and would not be able to provide any history.   Date last known well: 05/12/2015 Time last known well: Evening sometime tPA Given: No: Outside the time window   SUBJECTIVE (INTERVAL HISTORY) Daughter is at bedside. Saturday at 9 AM patient reports he got up and got dressed and did some chores around the house. He felt very tired and took a nap. When he woke up his face and hands felt numb. At that time his family found him confused in the living room. He had a headache and didn't feel well. He couldn't answer to his name or date of birth. Patient says he knew family was there but couldn't say all their names. Patient took glucose which was 212.    OBJECTIVE Temp:  [97.6 F (36.4 C)-97.9 F (36.6 C)] 97.7 F (36.5 C) (03/20 1816) Pulse Rate:  [69-82] 82 (03/20 1816) Cardiac Rhythm:  [-] Normal sinus rhythm (03/20 1900) Resp:  [16-20] 19 (03/20 1816) BP: (162-177)/(60-81) 177/81 mmHg (03/20 1816) SpO2:  [97 %-98 %] 98 % (03/20 1816)  CBC:   Recent Labs Lab 05/13/15 1629 05/13/15 1633 05/14/15 1829  WBC 4.9  --  4.5  NEUTROABS 3.6  --   --   HGB 11.1* 12.2* 10.6*  HCT 35.6* 36.0* 33.7*  MCV 86.6  --  86.4  PLT 140*  --  132*    Basic Metabolic Panel:   Recent Labs Lab 05/14/15 1829 05/15/15 1200  NA 141 144  K 4.2 4.7  CL 110 108  CO2 21* 22  GLUCOSE 248* 253*  BUN 31* 32*  CREATININE  2.76* 3.00*  CALCIUM 8.3* 8.3*    Lipid Panel:     Component Value Date/Time   CHOL 268* 05/14/2015 0502   TRIG 699* 05/14/2015 0502   HDL 30* 05/14/2015 0502   CHOLHDL 8.9 05/14/2015 0502   VLDL UNABLE TO CALCULATE IF TRIGLYCERIDE OVER 400 mg/dL 05/14/2015 0502   LDLCALC UNABLE TO CALCULATE IF TRIGLYCERIDE OVER 400 mg/dL 05/14/2015 0502   HgbA1c:  Lab Results  Component Value Date   HGBA1C 8.8* 05/14/2015   Urine Drug Screen:     Component Value Date/Time   LABOPIA NONE DETECTED 05/13/2015 1930   COCAINSCRNUR NONE DETECTED 05/13/2015 1930   LABBENZ NONE DETECTED 05/13/2015 1930   AMPHETMU NONE DETECTED 05/13/2015 1930   THCU NONE DETECTED 05/13/2015 1930   LABBARB NONE DETECTED 05/13/2015 1930      IMAGING  Dg Chest 2 View 05/13/2015   No active cardiopulmonary disease.  Ct Head Wo Contrast 05/13/2015   1. No acute intracranial hemorrhage.  2. Asymmetric right frontal subcortical and deep white matter hypodensity, probably due to chronic small vessel ischemia, although there is a small focus of questionable loss of gray-white differentiation in the right frontal lobe, for which an acute right frontal infarct cannot be excluded. Correlate with brain MRI as clinically warranted.  3.  Cerebral volume loss, intracranial atherosclerosis and chronic small vessel ischemia in the periventricular white matter.   Mr Brain Wo Contrast 05/13/2015   Small area of acute infarct right superior insula Moderate to advanced atrophy with chronic ischemic changes as above.     US Renal 05/14/2015   Unremarkable renal ultrasound.   Physical exam: Exam: Gen: NAD, conversant              CV: RRR, no MRG. No Carotid Bruits. No peripheral edema, warm, nontender Eyes: Conjunctivae clear without exudates or hemorrhage  Neuro: Detailed Neurologic Exam  Speech:    Speech is normal; fluent and spontaneous with normal comprehension. Was able to name, repeat, spell world forwards and  backwards, and perform serial sevens. Cognition:    The patient is oriented to person, place, and time, month, date and year.    recent and remote memory intact;     language fluent;     normal attention, concentration,  Cranial Nerves:    The pupils are equal, round, and reactive to light. The fundi are normal. Visual fields are full to finger confrontation. Extraocular movements are intact. Trigeminal sensation is intact and the muscles of mastication are normal. The face is symmetric. The palate elevates in the midline. Hearing intact to voice. Voice is normal. Shoulder shrug is normal. The tongue has normal motion without fasciculations.   Coordination:    Normal finger to nose and heel to shin.  Motor Observation:    No asymmetry, no atrophy, and no involuntary movements noted. Tone:    Normal muscle tone.      Strength:    Strength is V/V in the upper and lower limbs.      Sensation: intact to LT     Reflex Exam:  DTR's:    Biceps 2+ bilaterally, right patellar trace, left patellar 2+, absent AJs Toes:    The toes are downgoing bilaterally.   Clonus:    Clonus is absent.       ASSESSMENT/PLAN [name] is a 80 y.o. male with history of Diabetes mellitus, hyperlipidemia, hypertension, and coronary artery disease with previous MI presenting with unresponsiveness. He did not receive IV t-PA due to late presentation.   Stroke:  Non-dominant infarct secondary to embolic etiology source unknown  Resultant  resolution of symptoms  MRI  Small area of acute infarct right superior insula  MRA  no large vessel stenosis in the brain. MRA neck limited due to lack of contrast and motion artefact  Carotid Doppler  Severe plaque with 40-59% right ICA and 1-39% left ICA stenosis 2D Echo  LVEF 60-65%, moderate LVH, normal wall motion, diastolic  dysfunction and elevated LV filling pressure, RVH with normal RV   function. .  LDL unable to calculate secondary to  high triglycerides of 699  HgbA1c 8.8  VTE prophylaxis - subcutaneous heparin Diet Carb Modified Fluid consistency:: Thin; Room service appropriate?: Yes  aspirin 325 mg daily prior to admission, now on aspirin 325 mg daily (unclear if patient taking his medications consistently per daughter)  Patient counseled to be compliant with his antithrombotic medications  Ongoing aggressive stroke risk factor management  Therapy recommendations: Pending  Disposition:  Pending  Hypertension  Stable  Permissive hypertension (OK if < 220/120) but gradually normalize in 5-7 days  Hyperlipidemia  Home meds:  Zocor 20 mg daily not resumed in hospital  LDL unable to calculate, goal < 70  Resume Zocor at 40 mg daily  Continue statin  at discharge  Diabetes  HgbA1c 8.8, goal < 7.0  Uncontrolled (last 8.9 per daughter)  Other Stroke Risk Factors  Advanced age  Cigarette smoker, quit smoking   ETOH use  Coronary artery disease   Other Active Problems  Kidney disease  Anemia  Thrombocytopenia  Severely elevated triglycerides  Hospital day # 2   Personally examined patient and images, and have participated in and made any corrections needed to history, physical, neuro exam,assessment and plan as stated above.  I have personally obtained the history, evaluated lab date, reviewed imaging studies and agree with radiology interpretations.   Stroke team will sign off. Kindly follow-up as outpatient in stroke clinic in 2 months. Kindly call for questions.  Antony Contras, MD Stroke Neurology [facility]       To contact Stroke Continuity provider, please refer to [email or web address] After hours, contact General Neurology

## 2015-05-16 NOTE — Progress Notes (Signed)
PT Cancellation Note  Patient Details Name: Robert [name] MRN: [id number] DOB: [birth year]   Cancelled Treatment:    Reason Eval/Treat Not Completed: Patient at procedure or test/unavailable, PT to follow as able.    Cassell Clement, PT, CSCS Pager [phone] Office [phone]  05/16/2015, 12:47 PM

## 2015-05-16 NOTE — Interval H&P Note (Signed)
History and Physical Interval Note:  05/16/2015 8:00 AM  Robert [name]  has presented today for surgery, with the diagnosis of stroke  The various methods of treatment have been discussed with the patient and family. After consideration of risks, benefits and other options for treatment, the patient has consented to  Procedure(s): TRANSESOPHAGEAL ECHOCARDIOGRAM (TEE) (N/A) as a surgical intervention .  The patient's history has been reviewed, patient examined, no change in status, stable for surgery.  I have reviewed the patient's chart and labs.  Questions were answered to the patient's satisfaction.     Anyelin Mogle, Wonda Cheng

## 2015-05-16 NOTE — Progress Notes (Signed)
Occupational Therapy Treatment Patient Details Name: Robert [name] MRN: [id number] DOB: 02/20/1936 Today's Date: 05/16/2015    History of present illness Patient is a 80 year old male with diabetes mellitus, MI, cataract, insulin-dependent, GERD, hypertension, hyperlipidemia, CAD, hypothyroidism presented from home via EMS for confusion and difficulty speaking. imaging showing Small area of acute infarct right superior insula, Chronic infarct right frontal lobe. Chronic microvascular ischemia   OT comments  Tolerated session well. Min guard for ambulating and retrieving items in room.  Pt using LUE functionally without difficulty  Follow Up Recommendations  Home health OT;Supervision - Intermittent    Equipment Recommendations  None recommended by OT    Recommendations for Other Services      Precautions / Restrictions Precautions Precautions: Fall Restrictions Weight Bearing Restrictions: No       Mobility Bed Mobility Overal bed mobility: Modified Independent             General bed mobility comments: HOB raised  Transfers     Transfers: Sit to/from Stand Sit to Stand: Supervision         General transfer comment: for safety    Balance                                   ADL       Grooming: Oral care;Wash/dry hands;Min guard;Standing (cues not to get IV wet)                   Toilet Transfer: Min guard;Ambulation           Functional mobility during ADLs: Min guard General ADL Comments: worked on reaching different heights to retrieve clothing to work on balance.  Pt slightly unsteady but did not have LOB.  Pt able to use LUE to tie gown; he used thumb and third digit. able to open all containers without difficulty.  Pt lives with his wife who has an open sore on her ankle.  Recommended he not perform shower until family member can guard him getting in/out. He does have a seat in the shower.    When walking to bathroom, pt's RUE  was very close to wall--cued to step out so that he wouldn't hit IV.  L hand with min edema.  ROM WFLs.  Educated to make a fist and open throughout day to help with edema.      Vision                     Perception     Praxis      Cognition   Behavior During Therapy: WFL for tasks assessed/performed Overall Cognitive Status: Within Functional Limits for tasks assessed                       Extremity/Trunk Assessment               Exercises     Shoulder Instructions       General Comments      Pertinent Vitals/ Pain       Pain Assessment: No/denies pain  Home Living                                          Prior Functioning/Environment  Frequency Min 2X/week     Progress Toward Goals  OT Goals(current goals can now be found in the care plan section)  Progress towards OT goals: Progressing toward goals     Plan      Co-evaluation                 End of Session Equipment Utilized During Treatment: Gait belt   Activity Tolerance Patient tolerated treatment well   Patient Left in bed;with call bell/phone within reach;with bed alarm set   Nurse Communication          Time: [id number] OT Time Calculation (min): 29 min  Charges: OT General Charges $OT Visit: 1 Procedure OT Treatments $Self Care/Home Management : 23-37 mins  Rickey Sadowski 05/16/2015, 2:52 PM  Lesle Chris, OTR/L [phone] 05/16/2015

## 2015-05-16 NOTE — CV Procedure (Signed)
    Transesophageal Echocardiogram Note  HASNAIN MEITZ [id number] 25-Feb-1936  Procedure: Transesophageal Echocardiogram Indications: CVA  Procedure Details Consent: Obtained Time Out: Verified patient identification, verified procedure, site/side was marked, verified correct patient position, special equipment/implants available, Radiology Safety Procedures followed,  medications/allergies/relevent history reviewed, required imaging and test results available.  Performed  Medications:  During this procedure the patient is administered a total of Versed 5 mg and Fentanyl 75 mcg  to achieve and maintain moderate conscious sedation.  The patient's heart rate, blood pressure, and oxygen saturation are monitored continuously during the procedure. The period of conscious sedation is 30 minutes, of which I was present face-to-face 100% of this time.  Left Ventrical:  Normal LV function   Mitral Valve: mild - moderate MR   Aortic Valve: normal   Tricuspid Valve: normal   Pulmonic Valve: normal , trace PI  Left Atrium/ Left atrial appendage: clear , no thrombus   Atrial septum: + PFO by bubble study   Aorta: mild calcified plaque   Complications: No apparent complications Patient did tolerate procedure well.   Thayer Headings, Brooke Bonito., MD, [facility] 05/16/2015, 9:10 AM

## 2015-05-16 NOTE — Progress Notes (Signed)
OT Cancellation Note  Patient Details Name: Robert [name] MRN: [id number] DOB: 1935/12/12   Cancelled Treatment:    Reason Eval/Treat Not Completed: Patient at procedure or test/ unavailable.  Pt is at endoscopy. Will check back  Mat Stuard 05/16/2015, 8:43 AM  Lesle Chris, OTR/L [phone] 05/16/2015

## 2015-05-16 NOTE — Progress Notes (Signed)
[facility] PROGRESS NOTE  Robert [name] [id number] DOB: June 07, 1935 DOA: 05/13/2015 PCP: Geoffery Lyons, MD  Assessment/Plan: Principal Problem:   CVA (cerebral infarction) - Neurology on board - currently undergoing stroke workup once cleared for d/c will start disposition planning. - Pt s/p loop recorder insertion and TEE - Blood pressures have spiked as such will monitor another 24 hours.   Active Problems:   Diabetes mellitus ([place]) - Continue diabetic diet, Lantus, sliding scale insulin    ANEMIA - No active bleeding and improved on recheck    GERD - Continue Protonix    Hypertensive urgency - Allow for permissive hypertension - please see discussion above.     Hyperlipidemia - continue zocor  Code Status: full Family Communication: Discussed with patient and family at bedside Disposition Plan: Pending final recommendations from neurology   Consultants:  Neurology   Antibiotics:  None  HPI/Subjective: Patient has no new complaints. No acute issues overnight  Objective: Filed Vitals:   05/16/15 1442 05/16/15 1742  BP: 178/81 186/73  Pulse: 86 78  Temp: 97.7 F (36.5 C) 98.4 F (36.9 C)  Resp: 17 18    Intake/Output Summary (Last 24 hours) at 05/16/15 1831 Last data filed at 05/16/15 0257  Gross per 24 hour  Intake    480 ml  Output      0 ml  Net    480 ml   Filed Weights   05/13/15 1552  Weight: 81.647 kg (180 lb)    Exam:   General: Patient in no acute distress, alert and awake  Cardiovascular: S1 and S2 within normal limits  Respiratory: No increased work of breathing, no wheezes  Abdomen: Soft, nondistended, nontender  Musculoskeletal: No cyanosis or clubbing  Data Reviewed: Basic Metabolic Panel:  Recent Labs Lab 05/13/15 1629 05/13/15 1633 05/14/15 1829 05/15/15 1200  NA 141 141 141 144  K 4.7 4.6 4.2 4.7  CL 109 109 110 108  CO2 22  --  21* 22  GLUCOSE 272* 262* 248* 253*  BUN 31* 32* 31* 32*   CREATININE 2.60* 2.60* 2.76* 3.00*  CALCIUM 8.6*  --  8.3* 8.3*   Liver Function Tests:  Recent Labs Lab 05/13/15 1629 05/14/15 1829  AST 21 18  ALT 15* 14*  ALKPHOS 86 76  BILITOT 0.5 0.4  PROT 5.7* 5.4*  ALBUMIN 2.6* 2.3*   No results for input(s): LIPASE, AMYLASE in the last 168 hours. No results for input(s): AMMONIA in the last 168 hours. CBC:  Recent Labs Lab 05/13/15 1629 05/13/15 1633 05/14/15 1829  WBC 4.9  --  4.5  NEUTROABS 3.6  --   --   HGB 11.1* 12.2* 10.6*  HCT 35.6* 36.0* 33.7*  MCV 86.6  --  86.4  PLT 140*  --  132*   Cardiac Enzymes: No results for input(s): CKTOTAL, CKMB, CKMBINDEX, TROPONINI in the last 168 hours. BNP (last 3 results) No results for input(s): BNP in the last 8760 hours.  ProBNP (last 3 results) No results for input(s): PROBNP in the last 8760 hours.  CBG:  Recent Labs Lab 05/15/15 2011 05/15/15 2354 05/16/15 0357 05/16/15 1157 05/16/15 1653  GLUCAP 225* 200* 145* 267* 217*    Recent Results (from the past 240 hour(s))  Urine culture     Status: None   Collection Time: 05/13/15  7:32 PM  Result Value Ref Range Status   Specimen Description URINE, CLEAN CATCH  Final   Special Requests NONE  Final   Culture  NO GROWTH 2 DAYS  Final   Report Status 05/15/2015 FINAL  Final     Studies: Mr Virgel Paling [id number] Contrast  05/15/2015  CLINICAL DATA:  Initial evaluation for acute stroke. EXAM: MRA HEAD WITHOUT CONTRAST MRA NECK WITHOUT CONTRAST TECHNIQUE: Angiographic images of the Circle of Willis were obtained using MRA technique without intravenous contrast. Angiographic images of the neck were obtained using MRA technique without intravenous contrast. Carotid stenosis measurements (when applicable) are obtained utilizing NASCET criteria, using the distal internal carotid diameter as the denominator. COMPARISON:  Prior MRI from 05/13/2015. FINDINGS: MRA HEAD FINDINGS ANTERIOR CIRCULATION: Visualized distal cervical segment of the  internal carotid arteries are patent with antegrade flow. Petrous segments widely patent. Mild scattered atheromatous irregularity within the cavernous/supraclinoid ICAs without high-grade stenosis. A1 segments and anterior cerebral arteries opacified bilaterally. M1 segments patent without high-grade stenosis or occlusion. MCA bifurcations normal. No proximal M2 branch occlusion. Distal MCA branches not well evaluated on this exam, but appear to be somewhat attenuated on the right. POSTERIOR CIRCULATION: Vertebral arteries patent to the vertebrobasilar junction. Left vertebral artery dominant. Left posterior inferior cerebellar artery grossly patent. Right posterior inferior cerebral artery not well visualized. Basilar artery patent to its distal aspect. Superior cerebellar arteries patent bilaterally. Posterior cerebral arteries arise knee basilar artery and are opacified to their distal aspects. MRA NECK FINDINGS Study extremely limited due to lack of IV contrast and motion artifact. Aortic arch grossly of normal caliber. Evaluation for possible stenosis at the origin of the great vessels fairly limited on this exam. Right common carotid artery not well evaluated proximally. Right common carotid artery patent to the bifurcation. Right bifurcation not well evaluated on this exam due to motion. Right ICA patent to the skullbase without obvious flow limiting stenosis. Left common carotid artery not well evaluated proximally. Left common carotid artery patent to the bifurcation. Left bifurcation not well evaluated due to motion. Left ICA in grossly patent to the skullbase. Vertebral arteries not well evaluated proximally. Left vertebral artery is dominant. Vertebral arteries are grossly patent with antegrade flow to the skullbase. IMPRESSION: MRA HEAD IMPRESSION: 1. No large or proximal arterial branch occlusion within the intracranial circulation. No high-grade or correctable stenosis. 2. Apparent attenuation of the  distal right MCA and branches as compared to the left. MRA NECK IMPRESSION: Extremely limited study due to lack of IV contrast and motion artifact. No obvious flow-limiting or critical stenosis within the neck. Particularly, the carotid bifurcations are poorly evaluated due to motion artifact on this exam. Electronically Signed   By: Jeannine Boga M.D.   On: 05/15/2015 04:31   Mr Angiogram Neck Wo Contrast  05/15/2015  CLINICAL DATA:  Initial evaluation for acute stroke. EXAM: MRA HEAD WITHOUT CONTRAST MRA NECK WITHOUT CONTRAST TECHNIQUE: Angiographic images of the Circle of Willis were obtained using MRA technique without intravenous contrast. Angiographic images of the neck were obtained using MRA technique without intravenous contrast. Carotid stenosis measurements (when applicable) are obtained utilizing NASCET criteria, using the distal internal carotid diameter as the denominator. COMPARISON:  Prior MRI from 05/13/2015. FINDINGS: MRA HEAD FINDINGS ANTERIOR CIRCULATION: Visualized distal cervical segment of the internal carotid arteries are patent with antegrade flow. Petrous segments widely patent. Mild scattered atheromatous irregularity within the cavernous/supraclinoid ICAs without high-grade stenosis. A1 segments and anterior cerebral arteries opacified bilaterally. M1 segments patent without high-grade stenosis or occlusion. MCA bifurcations normal. No proximal M2 branch occlusion. Distal MCA branches not well evaluated on this exam, but appear to  be somewhat attenuated on the right. POSTERIOR CIRCULATION: Vertebral arteries patent to the vertebrobasilar junction. Left vertebral artery dominant. Left posterior inferior cerebellar artery grossly patent. Right posterior inferior cerebral artery not well visualized. Basilar artery patent to its distal aspect. Superior cerebellar arteries patent bilaterally. Posterior cerebral arteries arise knee basilar artery and are opacified to their distal  aspects. MRA NECK FINDINGS Study extremely limited due to lack of IV contrast and motion artifact. Aortic arch grossly of normal caliber. Evaluation for possible stenosis at the origin of the great vessels fairly limited on this exam. Right common carotid artery not well evaluated proximally. Right common carotid artery patent to the bifurcation. Right bifurcation not well evaluated on this exam due to motion. Right ICA patent to the skullbase without obvious flow limiting stenosis. Left common carotid artery not well evaluated proximally. Left common carotid artery patent to the bifurcation. Left bifurcation not well evaluated due to motion. Left ICA in grossly patent to the skullbase. Vertebral arteries not well evaluated proximally. Left vertebral artery is dominant. Vertebral arteries are grossly patent with antegrade flow to the skullbase. IMPRESSION: MRA HEAD IMPRESSION: 1. No large or proximal arterial branch occlusion within the intracranial circulation. No high-grade or correctable stenosis. 2. Apparent attenuation of the distal right MCA and branches as compared to the left. MRA NECK IMPRESSION: Extremely limited study due to lack of IV contrast and motion artifact. No obvious flow-limiting or critical stenosis within the neck. Particularly, the carotid bifurcations are poorly evaluated due to motion artifact on this exam. Electronically Signed   By: Jeannine Boga M.D.   On: 05/15/2015 04:31    Scheduled Meds: . aspirin  300 mg Rectal Daily   Or  . aspirin  325 mg Oral Daily  . heparin  5,000 Units Subcutaneous 3 times per day  . insulin aspart  0-9 Units Subcutaneous 6 times per day  . insulin glargine  15 Units Subcutaneous QHS  . levothyroxine  25 mcg Oral QAC breakfast  . pantoprazole  40 mg Oral Q supper  . simvastatin  40 mg Oral q1800   Continuous Infusions: . sodium chloride     Time spent: > 35 minutes  Velvet Bathe  [facility] Pager [phone] If 7PM-7AM, please  contact night-coverage at [email or web address], password [facility] 05/16/2015, 6:31 PM  LOS: 3 days

## 2015-05-17 ENCOUNTER — Encounter (HOSPITAL_COMMUNITY): Payer: Self-pay | Admitting: Internal Medicine

## 2015-05-17 LAB — GLUCOSE, CAPILLARY
GLUCOSE-CAPILLARY: 105 mg/dL — AB (ref 65–99)
GLUCOSE-CAPILLARY: 126 mg/dL — AB (ref 65–99)
Glucose-Capillary: 86 mg/dL (ref 65–99)

## 2015-05-17 LAB — BASIC METABOLIC PANEL
Anion gap: 10 (ref 5–15)
BUN: 36 mg/dL — AB (ref 6–20)
CALCIUM: 8.4 mg/dL — AB (ref 8.9–10.3)
CO2: 23 mmol/L (ref 22–32)
Chloride: 110 mmol/L (ref 101–111)
Creatinine, Ser: 2.66 mg/dL — ABNORMAL HIGH (ref 0.61–1.24)
GFR calc Af Amer: 25 mL/min — ABNORMAL LOW (ref >60)
GFR, EST NON AFRICAN AMERICAN: 22 mL/min — AB (ref >60)
GLUCOSE: 141 mg/dL — AB (ref 65–99)
POTASSIUM: 4.3 mmol/L (ref 3.5–5.1)
SODIUM: 143 mmol/L (ref 135–145)

## 2015-05-17 MED ORDER — INSULIN GLARGINE 100 UNIT/ML SOLOSTAR PEN: 15.0000 [IU] | PEN_INJECTOR | Freq: Every day | SUBCUTANEOUS | Status: DC

## 2015-05-17 MED ORDER — SIMVASTATIN 40 MG PO TABS: 40.0000 mg | ORAL_TABLET | Freq: Every day | ORAL | Status: AC

## 2015-05-17 MED ORDER — AMLODIPINE BESYLATE 5 MG PO TABS: 5.0000 mg | ORAL_TABLET | Freq: Every day | ORAL | Status: DC

## 2015-05-17 NOTE — Progress Notes (Signed)
Patient is discharged from room 5C20 at this time. Alert and in stable condition. IV site d/c'd as well as tele. Instructions read to patient and son in-law with understanding verbalized. EMMI consented to. Transported out of unit via wheelchair with all belongings at side.

## 2015-05-17 NOTE — Progress Notes (Signed)
Speech Language Pathology Treatment: Cognitive-Linquistic  Patient Details Name: Robert [name] MRN: [id number] DOB: [birth year] Today's Date: 05/17/2015 Time: 2707-8675 SLP Time Calculation (min) (ACUTE ONLY): 11 min  Assessment / Plan / Recommendation Clinical Impression  Pt is much improved with regard to word-retrieval, improved spontaneity of verbal output and length-of-utterance.  Son-in-law and pt feel he is close to baseline.  Discussed improvements in function since evaluation; no need for SLP f/u upon D/C.  Pt is to be D/Cd home today.  Our services will sign off.  Pt in agreement.    HPI HPI: Patient is a 80 year old male with diabetes mellitus, insulin-dependent, GERD, hypertension, hyperlipidemia, CAD, hypothyroidism presented from home via EMS for confusion and difficulty speaking.  Pt found to have a right superior insula CVA, chronic right frontal CVA and left medial parietal hemorrhages.  He has h/o dysphagia requiring dilatation, GERD, HTN, DM.  Bedside evaluation completed and follow up indicated as well as SLE.       SLP Plan  All goals met;Discharge SLP treatment due to (comment)     Recommendations          no SLP f/u      Oral Care Recommendations: Oral care BID Follow up Recommendations: None Plan: All goals met;Discharge SLP treatment due to (comment)     GO                Robert [name] [name] 05/17/2015, 11:41 AM

## 2015-05-17 NOTE — Care Management Note (Signed)
Case Management Note  Patient Details  Name: Robert [name] MRN: [id number] Date of Birth: 1935/05/18  Subjective/Objective:                    Action/Plan: Patient was admitted with CVA. Lives at home with spouse, for whom he is the primary caretaker. Will follow for discharge needs pending PT/OT evals and physician orders.  Expected Discharge Date:                  Expected Discharge Plan:     In-House Referral:     Discharge planning Services     Post Acute Care Choice:    Choice offered to:     DME Arranged:    DME Agency:     HH Arranged:    HH Agency:     Status of Service:  In process, will continue to follow  Medicare Important Message Given:    Date Medicare IM Given:    Medicare IM give by:    Date Additional Medicare IM Given:    Additional Medicare Important Message give by:     If discussed at [place] of Stay Meetings, dates discussed:    Additional Comments:  Rolm Baptise, RN 05/17/2015, 11:31 AM [phone]

## 2015-05-17 NOTE — Progress Notes (Signed)
OT Cancellation Note  Patient Details Name: Robert [name] MRN: [id number] DOB: Nov 02, 1935   Cancelled Treatment:    Reason Eval/Treat Not Completed: Other (comment). Pt is working with SLP. Will check back as schedule permits.  Danyella Mcginty 05/17/2015, 11:33 AM Lesle Chris, OTR/L [phone] 05/17/2015

## 2015-05-17 NOTE — Progress Notes (Signed)
Physical Therapy Treatment Patient Details Name: Robert [name] MRN: [id number] DOB: January 11, 1936 Today's Date: 05/17/2015    History of Present Illness Patient is a 80 year old male with diabetes mellitus, MI, cataract, insulin-dependent, GERD, hypertension, hyperlipidemia, CAD, hypothyroidism presented from home via EMS for confusion and difficulty speaking. imaging showing Small area of acute infarct right superior insula, Chronic infarct right frontal lobe. Chronic microvascular ischemia    PT Comments    Patient progressing, but remains at risk for falls with some degree of L inattention and decreased deficit awareness.  Educated pt and son specifically about fall prevention techniques (son was typing onto computer as I gave info,) and that pt should not be assisting wife at home.  Son asked about driving and feel pt easily distractible and with L inattention should not be driving.  Will benefit from follow up HHPT and family assist at d/c.  Follow Up Recommendations  Home health PT     Equipment Recommendations  3in1 (PT) (pt decided to purchase cane on his own)    Recommendations for Other Services       Precautions / Restrictions Precautions Precautions: Fall Restrictions Weight Bearing Restrictions: No    Mobility  Bed Mobility Overal bed mobility: Modified Independent                Transfers   Equipment used: None Transfers: Sit to/from Stand Sit to Stand: Supervision         General transfer comment: for safety  Ambulation/Gait Ambulation/Gait assistance: Min guard Ambulation Distance (Feet): 200 Feet (x 2 once without device, then with cane) Assistive device: None;Straight cane Gait Pattern/deviations: Step-through pattern;Decreased stride length;Shuffle     General Gait Details: noted difficulty maneuvering around obstacles on L (some level of inattention?) and demonstrated drifting to sides when looking to R or L to read signs; when practicing  with cane tends to hold it up off floor while walking; assist to place on floor with each step, then with cues only, but easlily distracted and forgets to use it unless cued again   Stairs   Stairs assistance: Supervision Stair Management: Step to pattern;Forwards;One rail Left;Alternating pattern;Sideways Number of Stairs: 6 General stair comments: step through with ascending, step to sideways to descend  Wheelchair Mobility    Modified Rankin (Stroke Patients Only) Modified Rankin (Stroke Patients Only) Pre-Morbid Rankin Score: No symptoms Modified Rankin: Moderately severe disability     Balance Overall balance assessment: Needs assistance   Sitting balance-Leahy Scale: Good       Standing balance-Leahy Scale: Good               High level balance activites: Backward walking;Head turns;Side stepping High Level Balance Comments: marching forwards and back, walking semitandem with both feet inside one set of tiles    Cognition Arousal/Alertness: Awake/alert Behavior During Therapy: WFL for tasks assessed/performed Overall Cognitive Status: Impaired/Different from baseline Area of Impairment: Safety/judgement         Safety/Judgement: Decreased awareness of deficits          Exercises      General Comments General comments (skin integrity, edema, etc.): Patient and son educated on fall prevention tips and useing cane in open environments due to difficulty with maneuvering around obstacles in hallway as well as some veering; also educated on not assisting wife at home due to imbalance      Pertinent Vitals/Pain Pain Assessment: No/denies pain    Home Living  Prior Function            PT Goals (current goals can now be found in the care plan section) Progress towards PT goals: Progressing toward goals    Frequency  Min 4X/week    PT Plan Current plan remains appropriate    Co-evaluation             End of  Session Equipment Utilized During Treatment: Gait belt Activity Tolerance: Patient tolerated treatment well Patient left: in bed;with call bell/phone within reach;with family/visitor present     Time: [id number] PT Time Calculation (min) (ACUTE ONLY): 28 min  Charges:  $Gait Training: 8-22 mins $Self Care/Home Management: 8-22                    G Codes:      Reginia Naas 2015-06-08, 1:47 PM  Magda Kiel, [place] 06/08/15

## 2015-05-17 NOTE — Care Management Note (Signed)
Case Management Note  Patient Details  Name: Robert [name] MRN: [id number] Date of Birth: 1935/09/21  Subjective/Objective:                    Action/Plan: Patient discharging home with orders for [facility] services. CM met with the patient and his son and provided him a list of [place] agencies in [place]. Patient states his wife uses [facility] and he would like to use them also. CM called and spoke with Malachy Mood at [facility] and they accepted the referral and information they requested was faxed to the number provided [phone]). Patient states there is already a 3 in 1 at his home and he does not need another one. Bedside RN updated.  Expected Discharge Date:                  Expected Discharge Plan:  [place]  In-House Referral:     Discharge planning Services  CM Consult  Post Acute Care Choice:  Durable Medical Equipment, Home Health Choice offered to:  Patient  DME Arranged:  3-N-1 (patient refused the 3 in 1) DME Agency:     HH Arranged:  PT HH Agency:  Center  Status of Service:  Completed, signed off  Medicare Important Message Given:  Yes Date Medicare IM Given:    Medicare IM give by:    Date Additional Medicare IM Given:    Additional Medicare Important Message give by:     If discussed at [place] of Stay Meetings, dates discussed:    Additional Comments:  Pollie Friar, RN 05/17/2015, 1:30 PM

## 2015-05-17 NOTE — Care Management Important Message (Signed)
Important Message  Patient Details  Name: Robert [name] MRN: [id number] Date of Birth: 02/19/1936   Medicare Important Message Given:  Yes    Barb Merino Heather Streeper 05/17/2015, 12:32 PM

## 2015-05-17 NOTE — Progress Notes (Signed)
Occupational Therapy Treatment Patient Details Name: Robert [name] MRN: [id number] DOB: 07/31/1935 Today's Date: 05/17/2015    History of present illness Patient is a 80 year old male with diabetes mellitus, MI, cataract, insulin-dependent, GERD, hypertension, hyperlipidemia, CAD, hypothyroidism presented from home via EMS for confusion and difficulty speaking. imaging showing Small area of acute infarct right superior insula, Chronic infarct right frontal lobe. Chronic microvascular ischemia   OT comments  Pt making good progress.  Recommend initial 24/7 for mobility as pt needed cues/guarding for RUE when ambulating yesterday (not to bump into door jam) and for head today when reaching forward to wash legs and don pants.    Follow Up Recommendations  Home health OT (initial 24/7 supervision for safety)    Equipment Recommendations  None recommended by OT    Recommendations for Other Services      Precautions / Restrictions Precautions Precautions: Fall Restrictions Weight Bearing Restrictions: No       Mobility Bed Mobility Overal bed mobility: Modified Independent                Transfers     Transfers: Sit to/from Stand Sit to Stand: Supervision         General transfer comment: for safety    Balance                                   ADL                                         General ADL Comments: Pt completed ADL at sink, standing for UB and peri areas. Sat for legs.  Supervision level when standing and min guard for LB bathing and donning pants/underwear (sit to stand) to guard head when he was reaching forward.  Yesterday, he was close to wall with L arm (although CVA was R MCA).  Family member present.  I recommended that pt be guarded for R side when walking/bending.  He has a built in Electronics engineer in his shower.  Pt is usually the caregiver for his wife.      Vision                     Perception      Praxis      Cognition   Behavior During Therapy: WFL for tasks assessed/performed Overall Cognitive Status: Within Functional Limits for tasks assessed                       Extremity/Trunk Assessment               Exercises     Shoulder Instructions       General Comments      Pertinent Vitals/ Pain       Pain Assessment: No/denies pain  Home Living                                          Prior Functioning/Environment              Frequency       Progress Toward Goals  OT Goals(current goals can now be found in the care plan section)  Progress towards OT goals:  Progressing toward goals     Plan      Co-evaluation                 End of Session     Activity Tolerance Patient tolerated treatment well   Patient Left in chair;with call bell/phone within reach;with chair alarm set;with family/visitor present   Nurse Communication          Time: [id number] OT Time Calculation (min): 25 min  Charges: OT General Charges $OT Visit: 1 Procedure OT Treatments $Self Care/Home Management : 23-37 mins  Zae Kirtz 05/17/2015, 1:20 PM  Lesle Chris, OTR/L [phone] 05/17/2015

## 2015-05-17 NOTE — Discharge Summary (Signed)
Physician Discharge Summary  Robert [name] [id number] DOB: 07/12/1935 DOA: 05/13/2015  PCP: Geoffery Lyons, MD  Admit date: 05/13/2015 Discharge date: 05/17/2015  Time spent: > 35 minutes  Recommendations for Outpatient Follow-up:   Monitor serum creatinine  Held metformin given contraindication in lieu of CKD  Monitor blood pressures. Patient may start normalizing BP 05/22/15 per neurology recommendations  Monitor blood sugars and adjust hypoglycemic agents accordingly   Discharge Diagnoses:  Principal Problem:   CVA (cerebral infarction) Active Problems:   Diabetes mellitus ([place])   ANEMIA   GERD   Acute encephalopathy   Hypertensive urgency   Hyperlipidemia   Acute right MCA stroke [facility])   Discharge Condition: stable  Diet recommendation: heart healthy  Filed Weights   05/13/15 1552  Weight: 81.647 kg (180 lb)    History of present illness:  From original HPI: 80 y.o. male with history of Diabetes mellitus, hyperlipidemia, hypertension, and coronary artery disease with previous MI who presented with confusion and expressive aphasia.  Hospital Course:  CVA - Neurology on board and recommended the following: Stroke: Non-dominant infarct secondary to embolic etiology source unknown  Resultant resolution of symptoms  MRI Small area of acute infarct right superior insula  MRA no large vessel stenosis in the [name]. MRA neck limited due to lack of contrast and motion artefact  Carotid Doppler Severe plaque with 40-59% right ICA and 1-39% left ICA stenosis 2D Echo LVEF 60-65%, moderate LVH, normal wall motion, diastolic  dysfunction and elevated LV filling pressure, RVH with normal RV   function. .  LDL unable to calculate secondary to high triglycerides of 699  HgbA1c 8.8  VTE prophylaxis - subcutaneous heparin  Diet Carb Modified Fluid consistency:: Thin; Room service appropriate?: Yes  aspirin 325 mg daily prior to admission, now on  aspirin 325 mg daily (unclear if patient taking his medications consistently per daughter)  Patient counseled to be compliant with his antithrombotic medications  Ongoing aggressive stroke risk factor management  Therapy recommendations: Pending  Disposition: Pending  Hypertension  Stable  Permissive hypertension (OK if < 220/120) but gradually normalize in 5-7 days  Hyperlipidemia  Home meds: Zocor 20 mg daily not resumed in hospital  LDL unable to calculate, goal < 70  Resume Zocor at 40 mg daily  Continue statin at discharge  Diabetes  HgbA1c 8.8, goal < 7.0  Uncontrolled (last 8.9 per daughter)  Other Stroke Risk Factors  Advanced age  Cigarette smoker, quit smoking   ETOH use  Coronary artery disease   Other Active Problems  Kidney disease  Anemia  Thrombocytopenia  Severely elevated triglycerides  Hospital day # 2   Personally examined patient and images, and have participated in and made any corrections needed to history, physical, neuro exam,assessment and plan as stated above. I have personally obtained the history, evaluated lab date, reviewed imaging studies and agree with radiology interpretations.   Stroke team will sign off. Kindly follow-up as outpatient in stroke clinic in 2 months  Procedures: Ct Head Wo Contrast 05/13/2015  1. No acute intracranial hemorrhage.  2. Asymmetric right frontal subcortical and deep white matter hypodensity, probably due to chronic small vessel ischemia, although there is a small focus of questionable loss of gray-white differentiation in the right frontal lobe, for which an acute right frontal infarct cannot be excluded. Correlate with [name] MRI as clinically warranted.  3. Cerebral volume loss, intracranial atherosclerosis and chronic small vessel ischemia in the periventricular white matter.   Robert [name] Robert [name]  Contrast 05/13/2015  Small area of acute infarct right superior insula Moderate to  advanced atrophy with chronic ischemic changes as above.   Consultations:  Neurology  Discharge Exam: Filed Vitals:   05/17/15 0513 05/17/15 0924  BP: 180/76 168/80  Pulse: 83 77  Temp: 98.5 F (36.9 C) 98.1 F (36.7 C)  Resp: 18 18    General: Pt in nad, alert and awake Cardiovascular: rrr, no mrg Respiratory: cta bl, no wheezes  Discharge Instructions   Discharge Instructions    Ambulatory referral to Neurology    Complete by:  As directed   Stroke office f/u in 2 months     Call MD for:  persistant dizziness or light-headedness    Complete by:  As directed      Call MD for:  severe uncontrolled pain    Complete by:  As directed      Call MD for:  temperature >100.4    Complete by:  As directed      Diet - low sodium heart healthy    Complete by:  As directed      Discharge instructions    Complete by:  As directed   Please be sure to follow-up with neurology for further evaluation and recommendations     Increase activity slowly    Complete by:  As directed           Current Discharge Medication List    CONTINUE these medications which have CHANGED   Details  amLODipine (NORVASC) 5 MG tablet Take 1 tablet (5 mg total) by mouth daily. Reported on 05/13/2015    Insulin Glargine (LANTUS SOLOSTAR) 100 UNIT/ML Solostar Pen Inject 15 Units into the skin daily at 10 pm. Reported on 05/13/2015 Qty: 15 mL, Refills: 11    simvastatin (ZOCOR) 40 MG tablet Take 1 tablet (40 mg total) by mouth daily at 6 PM. Qty: 30 tablet, Refills: 0      CONTINUE these medications which have NOT CHANGED   Details  albuterol (PROVENTIL HFA;VENTOLIN HFA) 108 (90 Base) MCG/ACT inhaler Inhale 2 puffs into the lungs every 6 (six) hours as needed for wheezing or shortness of breath.    aspirin 325 MG EC tablet Take 325 mg by mouth daily.    Blood Glucose Calibration (ACCU-CHEK AVIVA) SOLN     fenofibrate (TRICOR) 145 MG tablet Take 1 tablet by mouth Daily.    fexofenadine (ALLEGRA)  180 MG tablet Take 180 mg by mouth daily.    HUMALOG 100 UNIT/ML injection Inject 30 Units into the skin daily.    Lancets (ACCU-CHEK MULTICLIX) lancets     levothyroxine (SYNTHROID, LEVOTHROID) 50 MCG tablet Take 1 tablet by mouth daily.    Multiple Vitamins-Minerals (MULTIVITAMIN WITH MINERALS) tablet Take 1 tablet by mouth daily.    omeprazole (PRILOSEC) 20 MG capsule Take 1 capsule by mouth Daily.    ACCU-CHEK AVIVA PLUS test strip     pioglitazone (ACTOS) 45 MG tablet Take 1 tablet by mouth Daily. Reported on 05/13/2015      STOP taking these medications     metoprolol succinate (TOPROL-XL) 50 MG 24 hr tablet      Multiple Vitamins-Minerals (ANTIOXIDANT FORMULA SG) capsule      metFORMIN (GLUCOPHAGE) 500 MG tablet      peg 3350 powder (MOVIPREP) SOLR        Allergies  Allergen Reactions  . Lisinopril     "made me lose my voice"   Follow-up Information    Follow  up with [facility] On 05/25/2015.   Specialty:  Cardiology   Why:  at [place] for wound check    Contact information:   [address] [place] [phone]       The results of significant diagnostics from this hospitalization (including imaging, microbiology, ancillary and laboratory) are listed below for reference.    Significant Diagnostic Studies: Dg Chest 2 View  05/13/2015  CLINICAL DATA:  aphasia and right arm weakness, nausea and vomiting EXAM: CHEST  2 VIEW COMPARISON:  06/15/2012 FINDINGS: Mild cardiac enlargement. Vascular pattern normal. Mild scarring or atelectasis at both lung bases. No significant parenchymal opacities. IMPRESSION: No active cardiopulmonary disease. Electronically Signed   By: Skipper Cliche M.D.   On: 05/13/2015 19:10   Ct Head Wo Contrast  05/13/2015  CLINICAL DATA:  Aphasia.  Right upper extremity weakness. EXAM: CT HEAD WITHOUT CONTRAST TECHNIQUE: Contiguous axial images were obtained from the base of the  skull through the vertex without intravenous contrast. COMPARISON:  None. FINDINGS: No evidence of parenchymal hemorrhage or extra-axial fluid collection. No mass lesion, mass effect, or midline shift. There is asymmetric hypodensity in the right frontal subcortical and deep white matter. There is questionable focus of loss of gray-white differentiation in the right frontal lobe (series 2/ image 23). Intracranial atherosclerosis. Mild cerebral volume loss. No ventriculomegaly. The visualized paranasal sinuses are essentially clear. The mastoid air cells are unopacified. No evidence of calvarial fracture. IMPRESSION: 1. No acute intracranial hemorrhage. 2. Asymmetric right frontal subcortical and deep white matter hypodensity, probably due to chronic small vessel ischemia, although there is a small focus of questionable loss of gray-white differentiation in the right frontal lobe, for which an acute right frontal infarct cannot be excluded. Correlate with [name] MRI as clinically warranted. 3. Cerebral volume loss, intracranial atherosclerosis and chronic small vessel ischemia in the periventricular white matter. Electronically Signed   By: Ilona Sorrel M.D.   On: 05/13/2015 16:55   Robert Jodene Nam Head Wo Contrast  05/15/2015  CLINICAL DATA:  Initial evaluation for acute stroke. EXAM: MRA HEAD WITHOUT CONTRAST MRA NECK WITHOUT CONTRAST TECHNIQUE: Angiographic images of the Circle of Willis were obtained using MRA technique without intravenous contrast. Angiographic images of the neck were obtained using MRA technique without intravenous contrast. Carotid stenosis measurements (when applicable) are obtained utilizing NASCET criteria, using the distal internal carotid diameter as the denominator. COMPARISON:  Prior MRI from 05/13/2015. FINDINGS: MRA HEAD FINDINGS ANTERIOR CIRCULATION: Visualized distal cervical segment of the internal carotid arteries are patent with antegrade flow. Petrous segments widely patent. Mild  scattered atheromatous irregularity within the cavernous/supraclinoid ICAs without high-grade stenosis. A1 segments and anterior cerebral arteries opacified bilaterally. M1 segments patent without high-grade stenosis or occlusion. MCA bifurcations normal. No proximal M2 branch occlusion. Distal MCA branches not well evaluated on this exam, but appear to be somewhat attenuated on the right. POSTERIOR CIRCULATION: Vertebral arteries patent to the vertebrobasilar junction. Left vertebral artery dominant. Left posterior inferior cerebellar artery grossly patent. Right posterior inferior cerebral artery not well visualized. Basilar artery patent to its distal aspect. Superior cerebellar arteries patent bilaterally. Posterior cerebral arteries arise knee basilar artery and are opacified to their distal aspects. MRA NECK FINDINGS Study extremely limited due to lack of IV contrast and motion artifact. Aortic arch grossly of normal caliber. Evaluation for possible stenosis at the origin of the great vessels fairly limited on this exam. Right common carotid artery not well evaluated  proximally. Right common carotid artery patent to the bifurcation. Right bifurcation not well evaluated on this exam due to motion. Right ICA patent to the skullbase without obvious flow limiting stenosis. Left common carotid artery not well evaluated proximally. Left common carotid artery patent to the bifurcation. Left bifurcation not well evaluated due to motion. Left ICA in grossly patent to the skullbase. Vertebral arteries not well evaluated proximally. Left vertebral artery is dominant. Vertebral arteries are grossly patent with antegrade flow to the skullbase. IMPRESSION: MRA HEAD IMPRESSION: 1. No large or proximal arterial branch occlusion within the intracranial circulation. No high-grade or correctable stenosis. 2. Apparent attenuation of the distal right MCA and branches as compared to the left. MRA NECK IMPRESSION: Extremely limited  study due to lack of IV contrast and motion artifact. No obvious flow-limiting or critical stenosis within the neck. Particularly, the carotid bifurcations are poorly evaluated due to motion artifact on this exam. Electronically Signed   By: Jeannine Boga M.D.   On: 05/15/2015 04:31   Robert Angiogram Neck Wo Contrast  05/15/2015  CLINICAL DATA:  Initial evaluation for acute stroke. EXAM: MRA HEAD WITHOUT CONTRAST MRA NECK WITHOUT CONTRAST TECHNIQUE: Angiographic images of the Circle of Willis were obtained using MRA technique without intravenous contrast. Angiographic images of the neck were obtained using MRA technique without intravenous contrast. Carotid stenosis measurements (when applicable) are obtained utilizing NASCET criteria, using the distal internal carotid diameter as the denominator. COMPARISON:  Prior MRI from 05/13/2015. FINDINGS: MRA HEAD FINDINGS ANTERIOR CIRCULATION: Visualized distal cervical segment of the internal carotid arteries are patent with antegrade flow. Petrous segments widely patent. Mild scattered atheromatous irregularity within the cavernous/supraclinoid ICAs without high-grade stenosis. A1 segments and anterior cerebral arteries opacified bilaterally. M1 segments patent without high-grade stenosis or occlusion. MCA bifurcations normal. No proximal M2 branch occlusion. Distal MCA branches not well evaluated on this exam, but appear to be somewhat attenuated on the right. POSTERIOR CIRCULATION: Vertebral arteries patent to the vertebrobasilar junction. Left vertebral artery dominant. Left posterior inferior cerebellar artery grossly patent. Right posterior inferior cerebral artery not well visualized. Basilar artery patent to its distal aspect. Superior cerebellar arteries patent bilaterally. Posterior cerebral arteries arise knee basilar artery and are opacified to their distal aspects. MRA NECK FINDINGS Study extremely limited due to lack of IV contrast and motion artifact.  Aortic arch grossly of normal caliber. Evaluation for possible stenosis at the origin of the great vessels fairly limited on this exam. Right common carotid artery not well evaluated proximally. Right common carotid artery patent to the bifurcation. Right bifurcation not well evaluated on this exam due to motion. Right ICA patent to the skullbase without obvious flow limiting stenosis. Left common carotid artery not well evaluated proximally. Left common carotid artery patent to the bifurcation. Left bifurcation not well evaluated due to motion. Left ICA in grossly patent to the skullbase. Vertebral arteries not well evaluated proximally. Left vertebral artery is dominant. Vertebral arteries are grossly patent with antegrade flow to the skullbase. IMPRESSION: MRA HEAD IMPRESSION: 1. No large or proximal arterial branch occlusion within the intracranial circulation. No high-grade or correctable stenosis. 2. Apparent attenuation of the distal right MCA and branches as compared to the left. MRA NECK IMPRESSION: Extremely limited study due to lack of IV contrast and motion artifact. No obvious flow-limiting or critical stenosis within the neck. Particularly, the carotid bifurcations are poorly evaluated due to motion artifact on this exam. Electronically Signed   By: Pincus Badder.D.  On: 05/15/2015 04:31   Robert [name] Wo Contrast  05/13/2015  CLINICAL DATA:  Aphasia.  Right arm weakness.  Dementia. EXAM: MRI HEAD WITHOUT CONTRAST TECHNIQUE: Multiplanar, multiecho pulse sequences of the [name] and surrounding structures were obtained without intravenous contrast. COMPARISON:  CT head today FINDINGS: Small area of acute infarct in the right superior insula. This measures approximately 3 x 5 mm. No other acute infarct. Moderate to advanced atrophy. Chronic infarct right frontal lobe. Chronic microvascular ischemia throughout the white matter bilaterally. Small chronic parietal cortical infarcts bilaterally. Small  chronic infarcts in the cerebellum bilaterally. Microvascular ischemic changes in the pons. Chronic micro hemorrhages left medial parietal lobe. No hematoma or fluid collection. Negative for mass or edema. No shift of the midline structures. Normal skullbase.  Circle Jannifer Franklin is patent bilaterally. Mild mucosal edema paranasal sinuses.  Normal appendix. IMPRESSION: Small area of acute infarct right superior insula Moderate to advanced atrophy with chronic ischemic changes as above. Electronically Signed   By: Franchot Gallo M.D.   On: 05/13/2015 20:33   US Renal  05/14/2015  CLINICAL DATA:  Acute renal insufficiency.  Initial encounter. EXAM: RENAL / URINARY TRACT ULTRASOUND COMPLETE COMPARISON:  None. FINDINGS: Right Kidney: Length: 12.4 cm. Echogenicity within normal limits. No mass or hydronephrosis visualized. Left Kidney: Length: 11.1 cm. Echogenicity within normal limits. No mass or hydronephrosis visualized. Bladder: Appears normal for degree of bladder distention. IMPRESSION: Unremarkable renal ultrasound. Electronically Signed   By: Garald Balding M.D.   On: 05/14/2015 00:45    Microbiology: Recent Results (from the past 240 hour(s))  Urine culture     Status: None   Collection Time: 05/13/15  7:32 PM  Result Value Ref Range Status   Specimen Description URINE, CLEAN CATCH  Final   Special Requests NONE  Final   Culture NO GROWTH 2 DAYS  Final   Report Status 05/15/2015 FINAL  Final     Labs: Basic Metabolic Panel:  Recent Labs Lab 05/13/15 1629 05/13/15 1633 05/14/15 1829 05/15/15 1200 05/17/15 0735  NA 141 141 141 144 143  K 4.7 4.6 4.2 4.7 4.3  CL 109 109 110 108 110  CO2 22  --  21* 22 23  GLUCOSE 272* 262* 248* 253* 141*  BUN 31* 32* 31* 32* 36*  CREATININE 2.60* 2.60* 2.76* 3.00* 2.66*  CALCIUM 8.6*  --  8.3* 8.3* 8.4*   Liver Function Tests:  Recent Labs Lab 05/13/15 1629 05/14/15 1829  AST 21 18  ALT 15* 14*  ALKPHOS 86 76  BILITOT 0.5 0.4  PROT 5.7* 5.4*   ALBUMIN 2.6* 2.3*   No results for input(s): LIPASE, AMYLASE in the last 168 hours. No results for input(s): AMMONIA in the last 168 hours. CBC:  Recent Labs Lab 05/13/15 1629 05/13/15 1633 05/14/15 1829  WBC 4.9  --  4.5  NEUTROABS 3.6  --   --   HGB 11.1* 12.2* 10.6*  HCT 35.6* 36.0* 33.7*  MCV 86.6  --  86.4  PLT 140*  --  132*   Cardiac Enzymes: No results for input(s): CKTOTAL, CKMB, CKMBINDEX, TROPONINI in the last 168 hours. BNP: BNP (last 3 results) No results for input(s): BNP in the last 8760 hours.  ProBNP (last 3 results) No results for input(s): PROBNP in the last 8760 hours.  CBG:  Recent Labs Lab 05/16/15 2209 05/16/15 2344 05/17/15 0339 05/17/15 0716 05/17/15 1125  GLUCAP 249* 195* 105* 126* 86     Signed:  Velvet Bathe MD.  [facility] 05/17/2015, 12:55 PM

## 2015-05-19 ENCOUNTER — Encounter: Payer: Self-pay | Admitting: Internal Medicine

## 2015-05-22 ENCOUNTER — Encounter: Payer: Self-pay | Admitting: Internal Medicine

## 2015-05-23 ENCOUNTER — Encounter: Payer: Self-pay | Admitting: Internal Medicine

## 2015-05-24 ENCOUNTER — Encounter: Payer: Self-pay | Admitting: Internal Medicine

## 2015-05-25 ENCOUNTER — Encounter: Payer: Self-pay | Admitting: Internal Medicine

## 2015-05-25 ENCOUNTER — Ambulatory Visit (INDEPENDENT_AMBULATORY_CARE_PROVIDER_SITE_OTHER): Payer: Medicare Other | Admitting: *Deleted

## 2015-05-25 DIAGNOSIS — Z4509 Encounter for adjustment and management of other cardiac device: Secondary | ICD-10-CM

## 2015-05-25 LAB — CUP PACEART INCLINIC DEVICE CHECK: Date Time Interrogation Session: 20170330111839

## 2015-05-25 NOTE — Progress Notes (Signed)
Loop wound check in clinic. Steri-strips removed incision well healed, without redness swelling, drainage. Battery status: Good. R-waves 0.62mV. 0 symptom episodes, 0tachy episodes, 0 pause episodes, 0 brady episodes. 56 AF episodes (2.6% burden)- all episodes previously reviewed, EGMS show PACs & PVCs .Monthly summary reports and ROV with JA PRN. pt to follow-up with Dr. Wynonia Lawman he will call if carelink transfer is requested by Dr. Wynonia Lawman

## 2015-05-30 ENCOUNTER — Encounter: Payer: Self-pay | Admitting: Internal Medicine

## 2015-05-31 ENCOUNTER — Telehealth: Payer: Self-pay | Admitting: *Deleted

## 2015-05-31 NOTE — Telephone Encounter (Signed)
Called patient regarding wide-complex tachycardia episode on [facility] transmission from 05/29/15.  Patient states he was asymptomatic at the time.  He is aware to call with questions or concerns.  Episode strips printed for review by Dr. Rayann Heman. Patient is appreciative of call and denies questions or concerns at this time.

## 2015-06-05 ENCOUNTER — Other Ambulatory Visit (HOSPITAL_COMMUNITY): Payer: Self-pay | Admitting: Urology

## 2015-06-05 DIAGNOSIS — C61 Malignant neoplasm of prostate: Secondary | ICD-10-CM

## 2015-06-08 ENCOUNTER — Encounter: Payer: Self-pay | Admitting: Internal Medicine

## 2015-06-09 ENCOUNTER — Encounter (HOSPITAL_COMMUNITY)
Admission: RE | Admit: 2015-06-09 | Discharge: 2015-06-09 | Disposition: A | Discharge status: Home / Self Care | Payer: Medicare Other | Source: Ambulatory Visit | Attending: Urology | Admitting: Urology

## 2015-06-09 ENCOUNTER — Other Ambulatory Visit (HOSPITAL_COMMUNITY): Payer: Self-pay | Admitting: Urology

## 2015-06-09 DIAGNOSIS — C61 Malignant neoplasm of prostate: Secondary | ICD-10-CM | POA: Diagnosis not present

## 2015-06-09 MED ORDER — TECHNETIUM TC 99M MEDRONATE IV KIT
25.0000 | PACK | Freq: Once | INTRAVENOUS | Status: AC | PRN
  Administered 2015-06-09: 25.6 via INTRAVENOUS

## 2015-06-12 ENCOUNTER — Telehealth: Payer: Self-pay | Admitting: *Deleted

## 2015-06-12 ENCOUNTER — Encounter: Payer: Self-pay | Admitting: Internal Medicine

## 2015-06-12 NOTE — Telephone Encounter (Signed)
Patient called in regarding an "episode" he had this morning.  Patient states he felt like he was going to pass out an hour or two ago.  Requested and reviewed manual Carelink transmission--no abnormalities or alerts noted today.  His family checked his BP-165/70, and his CBG was 60.  Patient states this is low for him, and he would typically feel like he is going to pass out.  He is unsure if his "unit" to check blood sugar is properly calibrated.  He states that this morning his CBG was 88 and he only had an [place] muffin for breakfast and still took his diabetic medications.  Advised patient to eat something with protein and carbs and encouraged patient to call his PCP if low CBGs are an ongoing issue.  Patient verbalizes understanding and denies additional questions or concerns at this time.

## 2015-06-15 ENCOUNTER — Ambulatory Visit (INDEPENDENT_AMBULATORY_CARE_PROVIDER_SITE_OTHER): Payer: Medicare Other | Admitting: *Deleted

## 2015-06-15 DIAGNOSIS — Z4509 Encounter for adjustment and management of other cardiac device: Secondary | ICD-10-CM | POA: Diagnosis not present

## 2015-06-19 NOTE — Progress Notes (Signed)
Carelink Summary Report / Loop Recorder 

## 2015-06-20 ENCOUNTER — Telehealth: Payer: Self-pay | Admitting: *Deleted

## 2015-06-20 ENCOUNTER — Encounter: Payer: Self-pay | Admitting: Internal Medicine

## 2015-06-20 NOTE — Telephone Encounter (Signed)
Transmission received.  Called patient to make him aware.  Reviewed ECGs, some episodes suggest wide-complex tachycardia, patient reports he is asymptomatic.  Patient is scheduled to follow-up with Dr. Wynonia Lawman tomorrow, states he wants Dr. Wynonia Lawman to follow his ILR.  Advised patient that Dr. Thurman Coyer office will request a transfer of his Carelink monitoring after his appointment tomorrow.  Will fax ECGs to Dr. Thurman Coyer office per Dr. Jackalyn Lombard recommendation.  Patient is appreciative of call and denies additional questions or concerns at this time.  Called Dr. Thurman Coyer office, but staff member who monitors Carelink is currently out of office.  Left name and number for return call.

## 2015-06-20 NOTE — Telephone Encounter (Signed)
Called patient to request that he send a manual Carelink transmission.  Walked patient through transmission process.

## 2015-06-21 NOTE — Telephone Encounter (Signed)
Late entry from 06/20/15:  Nurse at Dr. Thurman Coyer office returned call.  Faxed [facility]link episode EGMs to their preferred fax number for review at patient's appointment on 06/21/15.  Transmission received per fax machine.

## 2015-07-17 ENCOUNTER — Ambulatory Visit (INDEPENDENT_AMBULATORY_CARE_PROVIDER_SITE_OTHER): Payer: Medicare Other | Admitting: *Deleted

## 2015-07-17 ENCOUNTER — Encounter: Payer: Self-pay | Admitting: Neurology

## 2015-07-17 ENCOUNTER — Ambulatory Visit (INDEPENDENT_AMBULATORY_CARE_PROVIDER_SITE_OTHER): Payer: Medicare Other | Admitting: Neurology

## 2015-07-17 VITALS — BP 133/64 | HR 67 | Ht 68.0 in | Wt 203.4 lb

## 2015-07-17 DIAGNOSIS — I639 Cerebral infarction, unspecified: Secondary | ICD-10-CM

## 2015-07-17 DIAGNOSIS — I6381 Other cerebral infarction due to occlusion or stenosis of small artery: Secondary | ICD-10-CM

## 2015-07-17 DIAGNOSIS — Z4509 Encounter for adjustment and management of other cardiac device: Secondary | ICD-10-CM

## 2015-07-17 NOTE — Patient Instructions (Signed)
I had a long d/w patient and his daughter about his recent stroke, risk for recurrent stroke/TIAs, personally independently reviewed imaging studies and stroke evaluation results and answered questions.Continue aspirin 325 mg daily  for secondary stroke prevention and maintain strict control of hypertension with blood pressure goal below 130/90, diabetes with hemoglobin A1c goal below 6.5% and lipids with LDL cholesterol goal below 70 mg/dL. I also advised the patient to eat a healthy diet with plenty of whole grains, cereals, fruits and vegetables, exercise regularly and maintain ideal body weight . I advised him to discuss with his endocrinologist Dr. Reynaldo Minium more restricted diet and better glycemic control he may increase physical activity gradually as tolerated.He was advised not to drive for 1 more month due to his recent syncope.Followup in the future with me in  6 months or call earlier if necessary. Stroke Prevention Some medical conditions and behaviors are associated with an increased chance of having a stroke. You may prevent a stroke by making healthy choices and managing medical conditions. HOW CAN I REDUCE MY RISK OF HAVING A STROKE?   Stay physically active. Get at least 30 minutes of activity on most or all days.  Do not smoke. It may also be helpful to avoid exposure to secondhand smoke.  Limit alcohol use. Moderate alcohol use is considered to be:  No more than 2 drinks per day for men.  No more than 1 drink per day for nonpregnant women.  Eat healthy foods. This involves:  Eating 5 or more servings of fruits and vegetables a day.  Making dietary changes that address high blood pressure (hypertension), high cholesterol, diabetes, or obesity.  Manage your cholesterol levels.  Making food choices that are high in fiber and low in saturated fat, trans fat, and cholesterol may control cholesterol levels.  Take any prescribed medicines to control cholesterol as directed by your  health care provider.  Manage your diabetes.  Controlling your carbohydrate and sugar intake is recommended to manage diabetes.  Take any prescribed medicines to control diabetes as directed by your health care provider.  Control your hypertension.  Making food choices that are low in salt (sodium), saturated fat, trans fat, and cholesterol is recommended to manage hypertension.  Ask your health care provider if you need treatment to lower your blood pressure. Take any prescribed medicines to control hypertension as directed by your health care provider.  If you are 15-66 years of age, have your blood pressure checked every 3-5 years. If you are 57 years of age or older, have your blood pressure checked every year.  Maintain a healthy weight.  Reducing calorie intake and making food choices that are low in sodium, saturated fat, trans fat, and cholesterol are recommended to manage weight.  Stop drug abuse.  Avoid taking birth control pills.  Talk to your health care provider about the risks of taking birth control pills if you are over 47 years old, smoke, get migraines, or have ever had a blood clot.  Get evaluated for sleep disorders (sleep apnea).  Talk to your health care provider about getting a sleep evaluation if you snore a lot or have excessive sleepiness.  Take medicines only as directed by your health care provider.  For some people, aspirin or blood thinners (anticoagulants) are helpful in reducing the risk of forming abnormal blood clots that can lead to stroke. If you have the irregular heart rhythm of atrial fibrillation, you should be on a blood thinner unless there is a  good reason you cannot take them.  Understand all your medicine instructions.  Make sure that other conditions (such as anemia or atherosclerosis) are addressed. SEEK IMMEDIATE MEDICAL CARE IF:   You have sudden weakness or numbness of the face, arm, or leg, especially on one side of the  body.  Your face or eyelid droops to one side.  You have sudden confusion.  You have trouble speaking (aphasia) or understanding.  You have sudden trouble seeing in one or both eyes.  You have sudden trouble walking.  You have dizziness.  You have a loss of balance or coordination.  You have a sudden, severe headache with no known cause.  You have new chest pain or an irregular heartbeat. Any of these symptoms may represent a serious problem that is an emergency. Do not wait to see if the symptoms will go away. Get medical help at once. Call your local emergency services (911 in [place] Do not drive yourself to the hospital.   This information is not intended to replace advice given to you by your health care provider. Make sure you discuss any questions you have with your health care provider.   Document Released: 03/21/2004 Document Revised: 03/04/2014 Document Reviewed: 08/14/2012 Elsevier Interactive Patient Education [employer].

## 2015-07-17 NOTE — Progress Notes (Signed)
[facility] [address] [place]. [place] [address] [phone]       OFFICE FOLLOW-UP NOTE  [name] Date of Birth:  September 19, 1935 Medical Record Number:  [id number]   HPI: 80 year male seen today for first office f/u visit after [facility] admission for syncope and stroke in march 2017.Robert [name] is an 80 y.o. male patient who was brought into the emergency room by his family members for further neurological evaluation. Patient lives with his wife who has severe dementia. His children last spoke to him last night on phone and was last seen physically 48 hours ago. Today when they visited his house, he was found to be unresponsive and not following commands.    family reported that his wife has severe dementia and would not be able to provide any history.Daughter reported on 05/12/15  at 9 AM patient reports he got up and got dressed and did some chores around the house. He felt very tired and took a nap. When he woke up his face and hands felt numb. At that time his family found him confused in the living room. He had a headache and didn't feel well. He couldn't answer to his name or date of birth. Patient says he knew family was there but couldn't say all their names. Patient took glucose which was 212.  Date last known well: 05/12/2015 Time last known well: Evening sometime tPA Given: No: Outside the time window . CT scan of the head on admission showed a questionable area of right frontal infarct however MRI scan showed a small right superior insular cortex infarct with moderate degree of generalized cerebral atrophy. Carotid ultrasound showed severe plaque in the right ICA with 40-59% stenosis. Transthoracic echo showed normal ejection fraction without cardiac source of an embolism. LDL cholesterol was unable to be calculated secondary to high triglycerides of 699 g percent. Hemoglobin A1c was elevated at 8.8. Patient was started on aspirin for stroke prevention  and home dose of Zocor was increased from 20240 mg percent. Patient states his done well since discharge. His sugars however still remain high despite his insulin pump. He states he often forgets to take extra dose of insulin that he needs to inject himself. I recommended he discuss this with his endocrinologist Dr. Retia Passe at the next visit. Patient states her blood pressures in the 150s usually though today it is 133/64 in office. Patient saw his cardiologist Dr. Coralyn Mark who did an outpatient stress test and plans to do a cardiac cath to evaluate his stents soon. Patient had loop recorder inserted by Dr. Rayann Heman and so far Atrial fibrillation has not yet been found.  ROS:   14 system review of systems is positive for  fatigue, hearing loss, shortness of breath, frequency of urination and all other systems negative  PMH:  Past Medical History  Diagnosis Date  . Allergy     year round  . Cataract   . Diabetes mellitus   . GERD (gastroesophageal reflux disease)   . Hyperlipidemia   . Hypertension   . Myocardial infarction ([facility]) 2011  . Thyroid disease     hypothyroid  . Hearing deficit   . Stroke (cerebrum) [facility])     Social History:  Social History   Social History  . Marital Status: Married    Spouse Name: N/A  . Number of Children: N/A  . Years of Education: N/A   Occupational History  . Not on file.   Social History  Main Topics  . Smoking status: Former Research scientist (life sciences)  . Smokeless tobacco: Never Used  . Alcohol Use: 1.2 oz/week    2 Glasses of wine per week  . Drug Use: No  . Sexual Activity: Not on file   Other Topics Concern  . Not on file   Social History Narrative    Medications:   Current Outpatient Prescriptions on File Prior to Visit  Medication Sig Dispense Refill  . ACCU-CHEK AVIVA PLUS test strip     . albuterol (PROVENTIL HFA;VENTOLIN HFA) 108 (90 Base) MCG/ACT inhaler Inhale 2 puffs into the lungs every 6 (six) hours as needed for wheezing or shortness of  breath.    [name] aspirin 325 MG EC tablet Take 325 mg by mouth daily.    . Blood Glucose Calibration (ACCU-CHEK AVIVA) SOLN     . fexofenadine (ALLEGRA) 180 MG tablet Take 180 mg by mouth daily.    . Lancets (ACCU-CHEK MULTICLIX) lancets     . levothyroxine (SYNTHROID, LEVOTHROID) 50 MCG tablet Take 1 tablet by mouth daily.    . Multiple Vitamins-Minerals (MULTIVITAMIN WITH MINERALS) tablet Take 1 tablet by mouth daily.    [name] omeprazole (PRILOSEC) 20 MG capsule Take 1 capsule by mouth Daily.    . simvastatin (ZOCOR) 40 MG tablet Take 1 tablet (40 mg total) by mouth daily at 6 PM. 30 tablet 0   No current facility-administered medications on file prior to visit.    Allergies:   Allergies  Allergen Reactions  . Lisinopril     "made me lose my voice"    Physical Exam General: well developed, well nourished Elderly male, seated, in no evident distress Head: head normocephalic and atraumatic.  Neck: supple with no carotid or supraclavicular bruits Cardiovascular: regular rate and rhythm, no murmurs Musculoskeletal: no deformity Skin:  no rash/petichiae Vascular:  Normal pulses all extremities Filed Vitals:   07/17/15 1315  BP: 133/64  Pulse: 67   Neurologic Exam Mental Status: Awake and fully alert. Oriented to place and time. Recent and remote memory intact. Attention span, concentration and fund of knowledge appropriate. Mood and affect appropriate.  Cranial Nerves: Fundoscopic exam reveals sharp disc margins. Pupils equal, briskly reactive to light. Extraocular movements full without nystagmus. Visual fields full to confrontation. Hearing intact. Facial sensation intact. Face, tongue, palate moves normally and symmetrically.  Motor: Normal bulk and tone. Normal strength in all tested extremity muscles. Sensory.: intact to touch ,pinprick .position and vibratory sensation.  Coordination: Rapid alternating movements normal in all extremities. Finger-to-nose and heel-to-shin performed  accurately bilaterally. Gait and Station: Arises from chair without difficulty. Stance is normal. Gait demonstrates normal stride length and balance . Able to heel, toe and tandem walk without difficulty.  Reflexes: 1+ and symmetric. Toes downgoing.   NIHSS  0 Modified Rankin  0  ASSESSMENT: 80 year male With a small embolic right insular cortex infarct in March 2017 etiology indeterminate probably small vessel disease but his clinical presentation suggested possible syncopal event for which she is getting cardiac monitoring and follow-up    PLAN: I had a long d/w patient and his daughter about his recent stroke, risk for recurrent stroke/TIAs, personally independently reviewed imaging studies and stroke evaluation results and answered questions.Continue aspirin 325 mg daily  for secondary stroke prevention and maintain strict control of hypertension with blood pressure goal below 130/90, diabetes with hemoglobin A1c goal below 6.5% and lipids with LDL cholesterol goal below 70 mg/dL. I also advised the patient to eat a  healthy diet with plenty of whole grains, cereals, fruits and vegetables, exercise regularly and maintain ideal body weight . I advised him to discuss with his endocrinologist Dr. Reynaldo Minium more restricted diet and better glycemic control he may increase physical activity gradually as tolerated.He was advised not to drive for 1 more month due to his recent syncope.Greater than 50% of time during this 25 minute visit was spent on counseling,explanation of diagnosis, planning of further management, discussion with patient and family and coordination of care Followup in the future with me in  6 months or call earlier if necessary.  Antony Contras, MD  [facility] [address] [place], [place] [address]  Phone [phone] Fax [phone]  Note: This document was prepared with digital dictation and possible smart phrase technology. Any transcriptional  errors that result from this process are unintentional

## 2015-07-17 NOTE — Progress Notes (Signed)
Carelink Summary Report / Loop Recorder 

## 2015-07-24 LAB — CUP PACEART REMOTE DEVICE CHECK: Date Time Interrogation Session: 20170420161035

## 2015-07-24 NOTE — Progress Notes (Signed)
Carelink summary report received. Battery status OK. Normal device function. No new symptom episodes, tachy episodes, brady, or pause episodes. AF episodes are SR with PAC's. Monthly summary reports and ROV/PRN

## 2015-08-14 ENCOUNTER — Ambulatory Visit (INDEPENDENT_AMBULATORY_CARE_PROVIDER_SITE_OTHER): Payer: Medicare Other | Admitting: *Deleted

## 2015-08-14 ENCOUNTER — Telehealth: Payer: Self-pay | Admitting: Neurology

## 2015-08-14 DIAGNOSIS — Z4509 Encounter for adjustment and management of other cardiac device: Secondary | ICD-10-CM

## 2015-08-14 NOTE — Telephone Encounter (Addendum)
Patient called to request that driving privileges be removed, had stroke 05/13/15 and saw Dr. Leonie Man 07/17/15, states Dr. Leonie Man told him not to drive for (1) more month. Patient states he needs to hear something about driving before [id number], he has a Doctor's appointment at 2:00pm with Dr. Reynaldo Minium, states if he doesn't hear anything by then, "then that's probably what he will do, drive to his Doctor's appointment".

## 2015-08-14 NOTE — Progress Notes (Signed)
Carelink Summary Report / Loop Recorder 

## 2015-08-14 NOTE — Telephone Encounter (Signed)
Rn call patient back about having the driving privileges remove. Pt saw Dr.Sethi on 07/17/2015. Rn stated one month will be 08/17/2015. Rn stated Dr. Leonie Man is currently on vacation this week and will not return until June 26. Rn stated Dr. Leonie Man will have to give him the final say when he returns to the office. Rn stated a message will be sent to Dr. Leonie Man.

## 2015-08-21 ENCOUNTER — Other Ambulatory Visit (HOSPITAL_COMMUNITY): Payer: Self-pay | Admitting: *Deleted

## 2015-08-21 NOTE — Telephone Encounter (Signed)
Patient called to check status of message nurse was going to send to Dr. Leonie Man regarding having driving restrictions removed.

## 2015-08-22 ENCOUNTER — Ambulatory Visit (HOSPITAL_COMMUNITY)
Admission: RE | Admit: 2015-08-22 | Discharge: 2015-08-22 | Disposition: A | Discharge status: Home / Self Care | Payer: Medicare Other | Source: Ambulatory Visit | Attending: Nephrology | Admitting: Nephrology

## 2015-08-22 DIAGNOSIS — Z5181 Encounter for therapeutic drug level monitoring: Secondary | ICD-10-CM | POA: Insufficient documentation

## 2015-08-22 DIAGNOSIS — N184 Chronic kidney disease, stage 4 (severe): Secondary | ICD-10-CM | POA: Diagnosis not present

## 2015-08-22 DIAGNOSIS — D631 Anemia in chronic kidney disease: Secondary | ICD-10-CM | POA: Diagnosis not present

## 2015-08-22 DIAGNOSIS — Z79899 Other long term (current) drug therapy: Secondary | ICD-10-CM | POA: Diagnosis not present

## 2015-08-22 DIAGNOSIS — D509 Iron deficiency anemia, unspecified: Secondary | ICD-10-CM | POA: Diagnosis not present

## 2015-08-22 LAB — POCT HEMOGLOBIN-HEMACUE: HEMOGLOBIN: 8.3 g/dL — AB (ref 13.0–17.0)

## 2015-08-22 MED ORDER — SODIUM CHLORIDE 0.9 % IV SOLN
510.0000 mg | Freq: Once | INTRAVENOUS | Status: AC
  Administered 2015-08-22: 510 mg via INTRAVENOUS
  Filled 2015-08-22: qty 17

## 2015-08-22 MED ORDER — DARBEPOETIN ALFA 100 MCG/0.5ML IJ SOSY
PREFILLED_SYRINGE | INTRAMUSCULAR | Status: AC
  Filled 2015-08-22: qty 0.5

## 2015-08-22 MED ORDER — DARBEPOETIN ALFA 100 MCG/0.5ML IJ SOSY
100.0000 ug | PREFILLED_SYRINGE | INTRAMUSCULAR | Status: DC
  Administered 2015-08-22: 100 ug via SUBCUTANEOUS

## 2015-08-22 NOTE — Discharge Instructions (Signed)
Ferumoxytol injection °What is this medicine? °FERUMOXYTOL is an iron complex. Iron is used to make healthy red blood cells, which carry oxygen and nutrients throughout the body. This medicine is used to treat iron deficiency anemia in people with chronic kidney disease. °This medicine may be used for other purposes; ask your health care provider or pharmacist if you have questions. °What should I tell my health care provider before I take this medicine? °They need to know if you have any of these conditions: °-anemia not caused by low iron levels °-high levels of iron in the blood °-magnetic resonance imaging (MRI) test scheduled °-an unusual or allergic reaction to iron, other medicines, foods, dyes, or preservatives °-pregnant or trying to get pregnant °-breast-feeding °How should I use this medicine? °This medicine is for injection into a vein. It is given by a health care professional in a hospital or clinic setting. °Talk to your pediatrician regarding the use of this medicine in children. Special care may be needed. °Overdosage: If you think you have taken too much of this medicine contact a poison control center or emergency room at once. °NOTE: This medicine is only for you. Do not share this medicine with others. °What if I miss a dose? °It is important not to miss your dose. Call your doctor or health care professional if you are unable to keep an appointment. °What may interact with this medicine? °This medicine may interact with the following medications: °-other iron products °This list may not describe all possible interactions. Give your health care provider a list of all the medicines, herbs, non-prescription drugs, or dietary supplements you use. Also tell them if you smoke, drink alcohol, or use illegal drugs. Some items may interact with your medicine. °What should I watch for while using this medicine? °Visit your doctor or healthcare professional regularly. Tell your doctor or healthcare  professional if your symptoms do not start to get better or if they get worse. You may need blood work done while you are taking this medicine. °You may need to follow a special diet. Talk to your doctor. Foods that contain iron include: whole grains/cereals, dried fruits, beans, or peas, leafy green vegetables, and organ meats (liver, kidney). °What side effects may I notice from receiving this medicine? °Side effects that you should report to your doctor or health care professional as soon as possible: °-allergic reactions like skin rash, itching or hives, swelling of the face, lips, or tongue °-breathing problems °-changes in blood pressure °-feeling faint or lightheaded, falls °-fever or chills °-flushing, sweating, or hot feelings °-swelling of the ankles or feet °Side effects that usually do not require medical attention (Report these to your doctor or health care professional if they continue or are bothersome.): °-diarrhea °-headache °-nausea, vomiting °-stomach pain °This list may not describe all possible side effects. Call your doctor for medical advice about side effects. You may report side effects to FDA at 1-800-FDA-1088. °Where should I keep my medicine? °This drug is given in a hospital or clinic and will not be stored at home. °NOTE: This sheet is a summary. It may not cover all possible information. If you have questions about this medicine, talk to your doctor, pharmacist, or health care provider. °  °© 2016, Elsevier/Gold Standard. (2011-09-27 15:23:36) °Darbepoetin Alfa injection °What is this medicine? °DARBEPOETIN ALFA (dar be POE e tin AL fa) helps your body make more red blood cells. It is used to treat anemia caused by chronic kidney failure and chemotherapy. °  This medicine may be used for other purposes; ask your health care provider or pharmacist if you have questions. °What should I tell my health care provider before I take this medicine? °They need to know if you have any of these  conditions: °-blood clotting disorders or history of blood clots °-cancer patient not on chemotherapy °-cystic fibrosis °-heart disease, such as angina, heart failure, or a history of a heart attack °-hemoglobin level of 12 g/dL or greater °-high blood pressure °-low levels of folate, iron, or vitamin B12 °-seizures °-an unusual or allergic reaction to darbepoetin, erythropoietin, albumin, hamster proteins, latex, other medicines, foods, dyes, or preservatives °-pregnant or trying to get pregnant °-breast-feeding °How should I use this medicine? °This medicine is for injection into a vein or under the skin. It is usually given by a health care professional in a hospital or clinic setting. °If you get this medicine at home, you will be taught how to prepare and give this medicine. Do not shake the solution before you withdraw a dose. Use exactly as directed. Take your medicine at regular intervals. Do not take your medicine more often than directed. °It is important that you put your used needles and syringes in a special sharps container. Do not put them in a trash can. If you do not have a sharps container, call your pharmacist or healthcare provider to get one. °Talk to your pediatrician regarding the use of this medicine in children. While this medicine may be used in children as young as 1 year for selected conditions, precautions do apply. °Overdosage: If you think you have taken too much of this medicine contact a poison control center or emergency room at once. °NOTE: This medicine is only for you. Do not share this medicine with others. °What if I miss a dose? °If you miss a dose, take it as soon as you can. If it is almost time for your next dose, take only that dose. Do not take double or extra doses. °What may interact with this medicine? °Do not take this medicine with any of the following medications: °-epoetin alfa °This list may not describe all possible interactions. Give your health care provider a  list of all the medicines, herbs, non-prescription drugs, or dietary supplements you use. Also tell them if you smoke, drink alcohol, or use illegal drugs. Some items may interact with your medicine. °What should I watch for while using this medicine? °Visit your prescriber or health care professional for regular checks on your progress and for the needed blood tests and blood pressure measurements. It is especially important for the doctor to make sure your hemoglobin level is in the desired range, to limit the risk of potential side effects and to give you the best benefit. Keep all appointments for any recommended tests. Check your blood pressure as directed. Ask your doctor what your blood pressure should be and when you should contact him or her. °As your body makes more red blood cells, you may need to take iron, folic acid, or vitamin B supplements. Ask your doctor or health care provider which products are right for you. If you have kidney disease continue dietary restrictions, even though this medication can make you feel better. Talk with your doctor or health care professional about the foods you eat and the vitamins that you take. °What side effects may I notice from receiving this medicine? °Side effects that you should report to your doctor or health care professional as soon as possible: °-  allergic reactions like skin rash, itching or hives, swelling of the face, lips, or tongue °-breathing problems °-changes in vision °-chest pain °-confusion, trouble speaking or understanding °-feeling faint or lightheaded, falls °-high blood pressure °-muscle aches or pains °-pain, swelling, warmth in the leg °-rapid weight gain °-severe headaches °-sudden numbness or weakness of the face, arm or leg °-trouble walking, dizziness, loss of balance or coordination °-seizures (convulsions) °-swelling of the ankles, feet, hands °-unusually weak or tired °Side effects that usually do not require medical attention (report  to your doctor or health care professional if they continue or are bothersome): °-diarrhea °-fever, chills (flu-like symptoms) °-headaches °-nausea, vomiting °-redness, stinging, or swelling at site where injected °This list may not describe all possible side effects. Call your doctor for medical advice about side effects. You may report side effects to FDA at 1-800-FDA-1088. °Where should I keep my medicine? °Keep out of the reach of children. °Store in a refrigerator between 2 and 8 degrees C (36 and 46 degrees F). Do not freeze. Do not shake. Throw away any unused portion if using a single-dose vial. Throw away any unused medicine after the expiration date. °NOTE: This sheet is a summary. It may not cover all possible information. If you have questions about this medicine, talk to your doctor, pharmacist, or health care provider. °  °© 2016, Elsevier/Gold Standard. (2008-01-26 10:23:57) ° °

## 2015-08-23 NOTE — Telephone Encounter (Signed)
Pt called again about driving. He said everything is fine and has been for weeks.  Patient said his wife is very ill and he has to take her to these appts. Please call

## 2015-08-23 NOTE — Telephone Encounter (Signed)
Patient is neurologically cleared to drive. Kindly inform him

## 2015-08-23 NOTE — Telephone Encounter (Signed)
Rn call patient and stated he is cleared to drive. Pt verbalized understanding.

## 2015-08-23 NOTE — Telephone Encounter (Signed)
Operator called pt and advised Dr Clydene Fake message. Pt was very happy and thankful

## 2015-08-26 LAB — CUP PACEART REMOTE DEVICE CHECK
Date Time Interrogation Session: 20170619161003
MDC IDC SESS DTM: 20170520160722

## 2015-09-05 ENCOUNTER — Ambulatory Visit (HOSPITAL_COMMUNITY)
Admission: RE | Admit: 2015-09-05 | Discharge: 2015-09-05 | Disposition: A | Discharge status: Home / Self Care | Payer: Medicare Other | Source: Ambulatory Visit | Attending: Nephrology | Admitting: Nephrology

## 2015-09-05 DIAGNOSIS — Z5181 Encounter for therapeutic drug level monitoring: Secondary | ICD-10-CM | POA: Diagnosis not present

## 2015-09-05 DIAGNOSIS — D631 Anemia in chronic kidney disease: Secondary | ICD-10-CM | POA: Insufficient documentation

## 2015-09-05 DIAGNOSIS — N184 Chronic kidney disease, stage 4 (severe): Secondary | ICD-10-CM | POA: Diagnosis not present

## 2015-09-05 DIAGNOSIS — Z79899 Other long term (current) drug therapy: Secondary | ICD-10-CM | POA: Diagnosis not present

## 2015-09-05 LAB — POCT HEMOGLOBIN-HEMACUE: HEMOGLOBIN: 9.3 g/dL — AB (ref 13.0–17.0)

## 2015-09-05 MED ORDER — DARBEPOETIN ALFA 100 MCG/0.5ML IJ SOSY
100.0000 ug | PREFILLED_SYRINGE | INTRAMUSCULAR | Status: DC
  Administered 2015-09-05: 100 ug via SUBCUTANEOUS

## 2015-09-05 MED ORDER — DARBEPOETIN ALFA 100 MCG/0.5ML IJ SOSY
PREFILLED_SYRINGE | INTRAMUSCULAR | Status: AC
  Administered 2015-09-05: 100 ug via SUBCUTANEOUS
  Filled 2015-09-05: qty 0.5

## 2015-09-13 ENCOUNTER — Ambulatory Visit (INDEPENDENT_AMBULATORY_CARE_PROVIDER_SITE_OTHER): Payer: Medicare Other | Admitting: *Deleted

## 2015-09-13 DIAGNOSIS — Z4509 Encounter for adjustment and management of other cardiac device: Secondary | ICD-10-CM

## 2015-09-13 NOTE — Progress Notes (Signed)
Carelink Summary Report / Loop Recorder 

## 2015-09-19 ENCOUNTER — Encounter (HOSPITAL_COMMUNITY)
Admission: RE | Admit: 2015-09-19 | Discharge: 2015-09-19 | Disposition: A | Discharge status: Home / Self Care | Payer: Medicare Other | Source: Ambulatory Visit | Attending: Nephrology | Admitting: Nephrology

## 2015-09-19 DIAGNOSIS — D638 Anemia in other chronic diseases classified elsewhere: Secondary | ICD-10-CM | POA: Diagnosis not present

## 2015-09-19 DIAGNOSIS — N184 Chronic kidney disease, stage 4 (severe): Secondary | ICD-10-CM | POA: Diagnosis not present

## 2015-09-19 DIAGNOSIS — D649 Anemia, unspecified: Secondary | ICD-10-CM

## 2015-09-19 LAB — IRON AND TIBC
IRON: 45 ug/dL (ref 45–182)
SATURATION RATIOS: 16 % — AB (ref 17.9–39.5)
TIBC: 279 ug/dL (ref 250–450)
UIBC: 234 ug/dL

## 2015-09-19 LAB — FERRITIN: FERRITIN: 77 ng/mL (ref 24–336)

## 2015-09-19 LAB — POCT HEMOGLOBIN-HEMACUE: HEMOGLOBIN: 10.7 g/dL — AB (ref 13.0–17.0)

## 2015-09-19 MED ORDER — DARBEPOETIN ALFA 100 MCG/0.5ML IJ SOSY
100.0000 ug | PREFILLED_SYRINGE | INTRAMUSCULAR | Status: DC
  Administered 2015-09-19: 100 ug via SUBCUTANEOUS

## 2015-09-19 MED ORDER — DARBEPOETIN ALFA 100 MCG/0.5ML IJ SOSY
PREFILLED_SYRINGE | INTRAMUSCULAR | Status: AC
  Administered 2015-09-19: 100 ug via SUBCUTANEOUS
  Filled 2015-09-19: qty 0.5

## 2015-10-02 ENCOUNTER — Other Ambulatory Visit (HOSPITAL_COMMUNITY): Payer: Self-pay | Admitting: *Deleted

## 2015-10-03 ENCOUNTER — Encounter (HOSPITAL_COMMUNITY)
Admission: RE | Admit: 2015-10-03 | Discharge: 2015-10-03 | Disposition: A | Discharge status: Home / Self Care | Payer: Medicare Other | Source: Ambulatory Visit | Attending: Nephrology | Admitting: Nephrology

## 2015-10-03 DIAGNOSIS — D649 Anemia, unspecified: Secondary | ICD-10-CM

## 2015-10-03 DIAGNOSIS — D509 Iron deficiency anemia, unspecified: Secondary | ICD-10-CM | POA: Insufficient documentation

## 2015-10-03 LAB — POCT HEMOGLOBIN-HEMACUE: HEMOGLOBIN: 11.1 g/dL — AB (ref 13.0–17.0)

## 2015-10-03 MED ORDER — DARBEPOETIN ALFA 100 MCG/0.5ML IJ SOSY
PREFILLED_SYRINGE | INTRAMUSCULAR | Status: AC
  Filled 2015-10-03: qty 0.5

## 2015-10-03 MED ORDER — SODIUM CHLORIDE 0.9 % IV SOLN
510.0000 mg | Freq: Once | INTRAVENOUS | Status: AC
  Administered 2015-10-03: 510 mg via INTRAVENOUS
  Filled 2015-10-03: qty 17

## 2015-10-03 MED ORDER — DARBEPOETIN ALFA 100 MCG/0.5ML IJ SOSY
100.0000 ug | PREFILLED_SYRINGE | INTRAMUSCULAR | Status: DC
  Administered 2015-10-03: 100 ug via SUBCUTANEOUS

## 2015-10-06 LAB — CUP PACEART REMOTE DEVICE CHECK: MDC IDC SESS DTM: 20170719163754

## 2015-10-13 ENCOUNTER — Ambulatory Visit (INDEPENDENT_AMBULATORY_CARE_PROVIDER_SITE_OTHER): Payer: Medicare Other | Admitting: *Deleted

## 2015-10-13 DIAGNOSIS — Z4509 Encounter for adjustment and management of other cardiac device: Secondary | ICD-10-CM | POA: Diagnosis not present

## 2015-10-13 NOTE — Progress Notes (Signed)
Carelink Summary Report / Loop Recorder 

## 2015-10-17 ENCOUNTER — Encounter (HOSPITAL_COMMUNITY): Payer: Medicare Other

## 2015-10-24 ENCOUNTER — Encounter (HOSPITAL_COMMUNITY)
Admission: RE | Admit: 2015-10-24 | Discharge: 2015-10-24 | Disposition: A | Discharge status: Home / Self Care | Payer: Medicare Other | Source: Ambulatory Visit | Attending: Nephrology | Admitting: Nephrology

## 2015-10-24 DIAGNOSIS — D509 Iron deficiency anemia, unspecified: Secondary | ICD-10-CM | POA: Diagnosis not present

## 2015-10-24 DIAGNOSIS — D649 Anemia, unspecified: Secondary | ICD-10-CM

## 2015-10-24 LAB — POCT HEMOGLOBIN-HEMACUE: Hemoglobin: 11.7 g/dL — ABNORMAL LOW (ref 13.0–17.0)

## 2015-10-24 LAB — IRON AND TIBC
Iron: 53 ug/dL (ref 45–182)
Saturation Ratios: 18 % (ref 17.9–39.5)
TIBC: 297 ug/dL (ref 250–450)
UIBC: 244 ug/dL

## 2015-10-24 LAB — FERRITIN: FERRITIN: 166 ng/mL (ref 24–336)

## 2015-10-24 MED ORDER — DARBEPOETIN ALFA 100 MCG/0.5ML IJ SOSY
PREFILLED_SYRINGE | INTRAMUSCULAR | Status: AC
  Filled 2015-10-24: qty 0.5

## 2015-10-24 MED ORDER — DARBEPOETIN ALFA 100 MCG/0.5ML IJ SOSY
100.0000 ug | PREFILLED_SYRINGE | INTRAMUSCULAR | Status: DC
  Administered 2015-10-24: 100 ug via SUBCUTANEOUS

## 2015-11-07 ENCOUNTER — Encounter (HOSPITAL_COMMUNITY)
Admission: RE | Admit: 2015-11-07 | Discharge: 2015-11-07 | Disposition: A | Discharge status: Home / Self Care | Payer: Medicare Other | Source: Ambulatory Visit | Attending: Nephrology | Admitting: Nephrology

## 2015-11-07 DIAGNOSIS — N184 Chronic kidney disease, stage 4 (severe): Secondary | ICD-10-CM | POA: Insufficient documentation

## 2015-11-07 DIAGNOSIS — D638 Anemia in other chronic diseases classified elsewhere: Secondary | ICD-10-CM | POA: Diagnosis not present

## 2015-11-07 DIAGNOSIS — D649 Anemia, unspecified: Secondary | ICD-10-CM

## 2015-11-07 LAB — POCT HEMOGLOBIN-HEMACUE: HEMOGLOBIN: 12.7 g/dL — AB (ref 13.0–17.0)

## 2015-11-07 MED ORDER — DARBEPOETIN ALFA 100 MCG/0.5ML IJ SOSY: 100.0000 ug | PREFILLED_SYRINGE | INTRAMUSCULAR | Status: DC

## 2015-11-08 LAB — CUP PACEART REMOTE DEVICE CHECK: MDC IDC SESS DTM: 20170818170723

## 2015-11-13 ENCOUNTER — Ambulatory Visit (INDEPENDENT_AMBULATORY_CARE_PROVIDER_SITE_OTHER): Payer: Medicare Other | Admitting: *Deleted

## 2015-11-13 DIAGNOSIS — Z4509 Encounter for adjustment and management of other cardiac device: Secondary | ICD-10-CM

## 2015-11-13 NOTE — Progress Notes (Signed)
Carelink Summary Report / Loop Recorder 

## 2015-11-21 ENCOUNTER — Encounter (HOSPITAL_COMMUNITY): Payer: Medicare Other

## 2015-11-28 ENCOUNTER — Inpatient Hospital Stay (HOSPITAL_COMMUNITY): Admission: RE | Admit: 2015-11-28 | Payer: Medicare Other | Source: Ambulatory Visit

## 2015-12-04 ENCOUNTER — Other Ambulatory Visit (HOSPITAL_COMMUNITY): Payer: Self-pay | Admitting: *Deleted

## 2015-12-05 ENCOUNTER — Encounter (HOSPITAL_COMMUNITY)
Admission: RE | Admit: 2015-12-05 | Discharge: 2015-12-05 | Disposition: A | Discharge status: Home / Self Care | Payer: Medicare Other | Source: Ambulatory Visit | Attending: Nephrology | Admitting: Nephrology

## 2015-12-05 DIAGNOSIS — N184 Chronic kidney disease, stage 4 (severe): Secondary | ICD-10-CM | POA: Diagnosis present

## 2015-12-05 DIAGNOSIS — D638 Anemia in other chronic diseases classified elsewhere: Secondary | ICD-10-CM | POA: Insufficient documentation

## 2015-12-05 LAB — IRON AND TIBC
Iron: 59 ug/dL (ref 45–182)
Saturation Ratios: 21 % (ref 17.9–39.5)
TIBC: 279 ug/dL (ref 250–450)
UIBC: 220 ug/dL

## 2015-12-05 LAB — FERRITIN: FERRITIN: 121 ng/mL (ref 24–336)

## 2015-12-05 LAB — POCT HEMOGLOBIN-HEMACUE: HEMOGLOBIN: 12.2 g/dL — AB (ref 13.0–17.0)

## 2015-12-05 MED ORDER — DARBEPOETIN ALFA 100 MCG/0.5ML IJ SOSY: 100.0000 ug | PREFILLED_SYRINGE | INTRAMUSCULAR | Status: DC

## 2015-12-08 LAB — CUP PACEART REMOTE DEVICE CHECK: Date Time Interrogation Session: 20170917170934

## 2015-12-12 ENCOUNTER — Ambulatory Visit (INDEPENDENT_AMBULATORY_CARE_PROVIDER_SITE_OTHER): Payer: Medicare Other | Admitting: *Deleted

## 2015-12-12 ENCOUNTER — Encounter (HOSPITAL_COMMUNITY): Payer: Medicare Other

## 2015-12-12 DIAGNOSIS — Z4509 Encounter for adjustment and management of other cardiac device: Secondary | ICD-10-CM

## 2015-12-13 NOTE — Progress Notes (Signed)
Carelink Summary Report / Loop Recorder 

## 2015-12-19 ENCOUNTER — Encounter (HOSPITAL_COMMUNITY): Payer: Medicare Other

## 2016-01-01 ENCOUNTER — Other Ambulatory Visit (HOSPITAL_COMMUNITY): Payer: Self-pay | Admitting: *Deleted

## 2016-01-02 ENCOUNTER — Encounter (HOSPITAL_COMMUNITY)
Admission: RE | Admit: 2016-01-02 | Discharge: 2016-01-02 | Disposition: A | Discharge status: Home / Self Care | Payer: Medicare Other | Source: Ambulatory Visit | Attending: Nephrology | Admitting: Nephrology

## 2016-01-02 DIAGNOSIS — D638 Anemia in other chronic diseases classified elsewhere: Secondary | ICD-10-CM | POA: Insufficient documentation

## 2016-01-02 DIAGNOSIS — N184 Chronic kidney disease, stage 4 (severe): Secondary | ICD-10-CM | POA: Diagnosis present

## 2016-01-02 LAB — POCT HEMOGLOBIN-HEMACUE: HEMOGLOBIN: 11.1 g/dL — AB (ref 13.0–17.0)

## 2016-01-02 MED ORDER — DARBEPOETIN ALFA 100 MCG/0.5ML IJ SOSY
100.0000 ug | PREFILLED_SYRINGE | INTRAMUSCULAR | Status: DC
  Administered 2016-01-02: 100 ug via SUBCUTANEOUS

## 2016-01-02 MED ORDER — DARBEPOETIN ALFA 100 MCG/0.5ML IJ SOSY
PREFILLED_SYRINGE | INTRAMUSCULAR | Status: AC
  Filled 2016-01-02: qty 0.5

## 2016-01-11 ENCOUNTER — Ambulatory Visit (INDEPENDENT_AMBULATORY_CARE_PROVIDER_SITE_OTHER): Payer: Medicare Other | Admitting: *Deleted

## 2016-01-11 DIAGNOSIS — Z4509 Encounter for adjustment and management of other cardiac device: Secondary | ICD-10-CM

## 2016-01-12 NOTE — Progress Notes (Signed)
Carelink Summary Report / Loop Recorder 

## 2016-01-13 LAB — CUP PACEART REMOTE DEVICE CHECK
Implantable Pulse Generator Implant Date: 20170321
MDC IDC SESS DTM: 20171017181100

## 2016-01-13 NOTE — Progress Notes (Signed)
Carelink summary report received. Battery status OK. Normal device function. No new symptom episodes, tachy episodes, brady, or pause episodes. No new AF episodes. Monthly summary reports and ROV/PRN 

## 2016-01-15 ENCOUNTER — Ambulatory Visit: Payer: Medicare Other | Admitting: Neurology

## 2016-01-30 ENCOUNTER — Encounter (HOSPITAL_COMMUNITY)
Admission: RE | Admit: 2016-01-30 | Discharge: 2016-01-30 | Disposition: A | Discharge status: Home / Self Care | Payer: Medicare Other | Source: Ambulatory Visit | Attending: Nephrology | Admitting: Nephrology

## 2016-01-30 DIAGNOSIS — N184 Chronic kidney disease, stage 4 (severe): Secondary | ICD-10-CM | POA: Insufficient documentation

## 2016-01-30 DIAGNOSIS — D638 Anemia in other chronic diseases classified elsewhere: Secondary | ICD-10-CM | POA: Insufficient documentation

## 2016-01-30 LAB — POCT HEMOGLOBIN-HEMACUE: Hemoglobin: 11.7 g/dL — ABNORMAL LOW (ref 13.0–17.0)

## 2016-01-30 LAB — IRON AND TIBC
Iron: 62 ug/dL (ref 45–182)
SATURATION RATIOS: 22 % (ref 17.9–39.5)
TIBC: 284 ug/dL (ref 250–450)
UIBC: 222 ug/dL

## 2016-01-30 LAB — FERRITIN: FERRITIN: 113 ng/mL (ref 24–336)

## 2016-01-30 MED ORDER — DARBEPOETIN ALFA 100 MCG/0.5ML IJ SOSY
100.0000 ug | PREFILLED_SYRINGE | INTRAMUSCULAR | Status: DC
  Administered 2016-01-30: 100 ug via SUBCUTANEOUS

## 2016-01-30 MED ORDER — DARBEPOETIN ALFA 100 MCG/0.5ML IJ SOSY
PREFILLED_SYRINGE | INTRAMUSCULAR | Status: AC
  Filled 2016-01-30: qty 0.5

## 2016-02-09 ENCOUNTER — Ambulatory Visit (INDEPENDENT_AMBULATORY_CARE_PROVIDER_SITE_OTHER): Payer: Medicare Other | Admitting: *Deleted

## 2016-02-09 DIAGNOSIS — Z4509 Encounter for adjustment and management of other cardiac device: Secondary | ICD-10-CM | POA: Diagnosis not present

## 2016-02-12 NOTE — Progress Notes (Signed)
Carelink Summary Report / Loop Recorder 

## 2016-02-21 ENCOUNTER — Other Ambulatory Visit: Payer: Self-pay | Admitting: Vascular Surgery

## 2016-02-21 DIAGNOSIS — N184 Chronic kidney disease, stage 4 (severe): Secondary | ICD-10-CM

## 2016-02-21 DIAGNOSIS — Z01810 Encounter for preprocedural cardiovascular examination: Secondary | ICD-10-CM

## 2016-02-22 LAB — CUP PACEART REMOTE DEVICE CHECK
Implantable Pulse Generator Implant Date: 20170321
MDC IDC SESS DTM: 20171116191142

## 2016-02-27 ENCOUNTER — Encounter (HOSPITAL_COMMUNITY)
Admission: RE | Admit: 2016-02-27 | Discharge: 2016-02-27 | Disposition: A | Discharge status: Home / Self Care | Payer: Medicare Other | Source: Ambulatory Visit | Attending: Nephrology | Admitting: Nephrology

## 2016-02-27 ENCOUNTER — Telehealth: Payer: Self-pay | Admitting: *Deleted

## 2016-02-27 DIAGNOSIS — N184 Chronic kidney disease, stage 4 (severe): Secondary | ICD-10-CM | POA: Diagnosis not present

## 2016-02-27 DIAGNOSIS — D638 Anemia in other chronic diseases classified elsewhere: Secondary | ICD-10-CM | POA: Insufficient documentation

## 2016-02-27 LAB — POCT HEMOGLOBIN-HEMACUE: HEMOGLOBIN: 11.4 g/dL — AB (ref 13.0–17.0)

## 2016-02-27 MED ORDER — DARBEPOETIN ALFA 100 MCG/0.5ML IJ SOSY
PREFILLED_SYRINGE | INTRAMUSCULAR | Status: AC
  Administered 2016-02-27: 09:00:00 100 ug via SUBCUTANEOUS
  Filled 2016-02-27: qty 0.5

## 2016-02-27 MED ORDER — DARBEPOETIN ALFA 100 MCG/0.5ML IJ SOSY
100.0000 ug | PREFILLED_SYRINGE | INTRAMUSCULAR | Status: DC
  Administered 2016-02-27: 100 ug via SUBCUTANEOUS

## 2016-02-27 NOTE — Telephone Encounter (Signed)
Spoke with patient, requested manual Carelink transmission for review.  "AF" episode from 12/30 suggests frequent short runs of PVCs.  Walked patient through manual transmission process, transmission received.  "AF" episodes printed for review.  Most episodes suggest SR/ST with frequent PACs, PVCs (including couplets), and occasional NSVT (longest 10bts).  EF as of 04/2015 was 55-60%.  Patient asymptomatic with most episodes, but states he has had a cold for the past 3 days and has noticed his heart beat more than usual.  Advised patient that MD will review and we will contact him if any further recommendations.  Patient verbalizes understanding and appreciation.  He denies additional questions or concerns at this time.  Episodes placed in Dr. Otilio Connors folder for review.

## 2016-03-04 NOTE — Telephone Encounter (Signed)
Episodes reviewed by Dr. Rayann Heman.  Follow-up recommended to discuss frequent PVCs and NSVT on LINQ.  Spoke with patient, he is agreeable to appointment on 03/14/16 at 2:15pm with Dr. Rayann Heman.  Patient is appreciative of call and denies additional questions or concerns at this time.

## 2016-03-11 ENCOUNTER — Ambulatory Visit (INDEPENDENT_AMBULATORY_CARE_PROVIDER_SITE_OTHER): Payer: Medicare Other | Admitting: *Deleted

## 2016-03-11 DIAGNOSIS — Z4509 Encounter for adjustment and management of other cardiac device: Secondary | ICD-10-CM

## 2016-03-11 NOTE — Progress Notes (Signed)
Carelink Summary Report / Loop Recorder 

## 2016-03-14 ENCOUNTER — Encounter: Payer: Self-pay | Admitting: Vascular Surgery

## 2016-03-14 ENCOUNTER — Encounter: Payer: Medicare Other | Admitting: Internal Medicine

## 2016-03-15 ENCOUNTER — Other Ambulatory Visit: Payer: Self-pay | Admitting: Internal Medicine

## 2016-03-18 ENCOUNTER — Other Ambulatory Visit: Payer: Self-pay | Admitting: Internal Medicine

## 2016-03-19 ENCOUNTER — Telehealth: Payer: Self-pay | Admitting: *Deleted

## 2016-03-19 NOTE — Telephone Encounter (Addendum)
Attempted to reach patient to reschedule appointment with Dr. Rayann Heman.  Patient's wife answered cell number but states she is unable to get out of bed to find patient due to her back.  She requests that I call back at the home number.  Called the home number, no answer, no VM set up.  Will try again tomorrow.

## 2016-03-20 ENCOUNTER — Ambulatory Visit (HOSPITAL_COMMUNITY)
Admission: RE | Admit: 2016-03-20 | Discharge: 2016-03-20 | Disposition: A | Discharge status: Home / Self Care | Payer: Medicare Other | Source: Ambulatory Visit | Attending: Vascular Surgery | Admitting: Vascular Surgery

## 2016-03-20 ENCOUNTER — Encounter: Payer: Self-pay | Admitting: Vascular Surgery

## 2016-03-20 ENCOUNTER — Ambulatory Visit (INDEPENDENT_AMBULATORY_CARE_PROVIDER_SITE_OTHER)
Admission: RE | Admit: 2016-03-20 | Discharge: 2016-03-20 | Disposition: A | Discharge status: Home / Self Care | Payer: Medicare Other | Source: Ambulatory Visit | Attending: Vascular Surgery | Admitting: Vascular Surgery

## 2016-03-20 ENCOUNTER — Ambulatory Visit (INDEPENDENT_AMBULATORY_CARE_PROVIDER_SITE_OTHER): Payer: Medicare Other | Admitting: Vascular Surgery

## 2016-03-20 ENCOUNTER — Encounter: Payer: Self-pay | Admitting: Internal Medicine

## 2016-03-20 VITALS — BP 169/86 | HR 64 | Temp 96.7°F | Resp 16 | Ht 68.0 in | Wt 198.0 lb

## 2016-03-20 DIAGNOSIS — N184 Chronic kidney disease, stage 4 (severe): Secondary | ICD-10-CM | POA: Insufficient documentation

## 2016-03-20 DIAGNOSIS — Z01810 Encounter for preprocedural cardiovascular examination: Secondary | ICD-10-CM | POA: Diagnosis not present

## 2016-03-20 DIAGNOSIS — Z01818 Encounter for other preprocedural examination: Secondary | ICD-10-CM | POA: Insufficient documentation

## 2016-03-20 DIAGNOSIS — N186 End stage renal disease: Secondary | ICD-10-CM | POA: Insufficient documentation

## 2016-03-20 DIAGNOSIS — Z992 Dependence on renal dialysis: Secondary | ICD-10-CM

## 2016-03-20 NOTE — Telephone Encounter (Signed)
Charlett Nose at 03/20/2016 1:47 PM   Status: Signed    New message   Pt verbalized that rn needs to call him because he is confused with why he needs to see Dr.Allred and not Dr.Tilly

## 2016-03-20 NOTE — Progress Notes (Addendum)
Referred by:  Fleet Contras, MD [address] [place], [place] [address]  Reason for referral: New access  History of Present Illness  Robert [name] is a 81 y.o. (Feb 08, 1936) male who presents for evaluation for permanent access.  The patient is right hand dominant.  The patient has not had previous access procedures.  Previous central venous cannulation procedures include: none.  The patient has never had a PPM placed. Atherosclerotic risk factors include: DM, HTN, HLD, and prior smoking  Past Medical History:  Diagnosis Date  . Allergy    year round  . Cataract   . Diabetes mellitus   . GERD (gastroesophageal reflux disease)   . Hearing deficit   . Hyperlipidemia   . Hypertension   . Myocardial infarction 2011  . Stroke (cerebrum) ([place])   . Thyroid disease    hypothyroid    Past Surgical History:  Procedure Laterality Date  . CORONARY ANGIOPLASTY WITH STENT PLACEMENT  07/2009   3 stents  . EP IMPLANTABLE DEVICE N/A 05/16/2015   Procedure: Loop Recorder Insertion;  Surgeon: Thompson Grayer, MD;  Location: [facility];  Service: Cardiovascular;  Laterality: N/A;  . insulin pump needs to be removed for mri    . stents    . TEE WITHOUT CARDIOVERSION N/A 05/16/2015   Procedure: TRANSESOPHAGEAL ECHOCARDIOGRAM (TEE);  Surgeon: Thayer Headings, MD;  Location[facility] ENDOSCOPY;  Service: Cardiovascular;  Laterality: N/A;    Social History   Social History  . Marital status: Married    Spouse name: N/A  . Number of children: N/A  . Years of education: N/A   Occupational History  . Not on file.   Social History Main Topics  . Smoking status: Former Research scientist (life sciences)  . Smokeless tobacco: Never Used  . Alcohol use 1.2 oz/week    2 Glasses of wine per week  . Drug use: No  . Sexual activity: Not on file   Other Topics Concern  . Not on file   Social History Narrative  . No narrative on file    Family History  Problem Relation Age of Onset  . Dementia Maternal  Grandfather     Current Outpatient Prescriptions  Medication Sig Dispense Refill  . ACCU-CHEK AVIVA PLUS test strip     . albuterol (PROVENTIL HFA;VENTOLIN HFA) 108 (90 Base) MCG/ACT inhaler Inhale 2 puffs into the lungs every 6 (six) hours as needed for wheezing or shortness of breath.    [name] aspirin 325 MG EC tablet Take 325 mg by mouth daily.    . Blood Glucose Calibration (ACCU-CHEK AVIVA) SOLN     . fexofenadine (ALLEGRA) 180 MG tablet Take 180 mg by mouth daily.    . furosemide (LASIX) 20 MG tablet Take 20 mg by mouth 2 (two) times daily.    . hydrALAZINE (APRESOLINE) 100 MG tablet Take 100mg  in am and 50mg  in noon and 100mg  in the pm    . Insulin Human (INSULIN PUMP) SOLN Inject into the skin. Take  40units  Basal and 36 units bolus, humalog is in the pump    . Lancets (ACCU-CHEK MULTICLIX) lancets     . levothyroxine (SYNTHROID, LEVOTHROID) 50 MCG tablet Take 1 tablet by mouth daily.    . metoprolol succinate (TOPROL-XL) 50 MG 24 hr tablet 50 mg 2 (two) times daily.     . Multiple Vitamins-Minerals (MULTIVITAMIN WITH MINERALS) tablet Take 1 tablet by mouth daily.    [name] omeprazole (PRILOSEC) 20 MG capsule Take  1 capsule by mouth Daily.    . simvastatin (ZOCOR) 40 MG tablet Take 1 tablet (40 mg total) by mouth daily at 6 PM. 30 tablet 0   No current facility-administered medications for this visit.     Allergies  Allergen Reactions  . Lisinopril Other (See Comments)    "made me lose my voice" "made me lose my voice"    REVIEW OF SYSTEMS:   Cardiac:  positive for: Chest pain or pressure, DOE, negative for: Shortness of breath when lying flat,   Vascular:  positive for: no symptoms,  negative for: Pain in calf, thigh, or hip brought on by ambulation, Pain in feet at night that wakes you up from your sleep, Blood clot in your veins and Leg swelling  Pulmonary:  positive for: Productive cough,  negative for: Oxygen at home and Wheezing  Neurologic:  positive for: No  symptoms, negative for: Sudden weakness in arms or legs, Sudden numbness in arms or legs, Sudden onset of difficulty speaking or slurred speech, Temporary loss of vision in one eye and Problems with dizziness  Gastrointestinal:  positive for: no symptoms, negative for: Blood in stool and Vomited blood  Genitourinary:  positive for: no symptoms, negative for: Burning when urinating and Blood in urine  Psychiatric:  positive for: no symptoms,  negative for: Major depression  Hematologic:  positive for: no symptoms,  negative for: negative for: Bleeding problems and Problems with blood clotting too easily  Dermatologic:  positive for: no symptoms, negative for: Rashes or ulcers  Constitutional:  positive for: no symptoms, negative for: Fever or chills  Ear/Nose/Throat:  positive for: no symptoms, negative for: Change in hearing, Nose bleeds and Sore throat  Musculoskeletal:  positive for: no symptoms, negative for: Back pain, Joint pain and Muscle pain   Physical Examination  Vitals:   03/20/16 0926 03/20/16 0929  BP: (!) 168/71 (!) 169/86  Pulse: 64 64  Resp: 16 16  Temp: (!) 96.7 F (35.9 C)   SpO2: 97% 97%  Weight: 198 lb (89.8 kg) 198 lb (89.8 kg)  Height: 5\' 8"  (1.727 m) 5\' 8"  (1.727 m)    Body mass index is 30.11 kg/m.  General: Alert, O x 3, WD,NAD  Head: [place]/AT,   Ear/Nose/Throat: Hearing grossly intact, nares without erythema or drainage, oropharynx without Erythema or Exudate , Mallampati score: 3, Dentition intact  Eyes: PERRLA, EOMI,   Neck: Supple, mid-line trachea,    Pulmonary: Sym exp, good B air movt,CTA B  Cardiac: RRR, Nl S1, S2, no Murmurs, No rubs, No S3,S4  Vascular: Vessel Right Left  Radial Palpable Palpable  Brachial Palpable Palpable  Carotid Palpable, No Bruit Palpable, No Bruit  Aorta Not palpable N/A  Femoral Palpable Palpable  Popliteal Not palpable Not palpable  PT Not palpable Not palpable  DP Palpable Palpable    Gastrointestinal: soft, non-distended, non-tender to palpation, No guarding or rebound, no HSM, no masses, no CVAT B, No palpable prominent aortic pulse,    Musculoskeletal: M/S 5/5 throughout  , Extremities without ischemic changes  , No edema present,  , No LDS present  Neurologic: CN 2-12 intact , Pain and light touch intact in extremities , Motor exam as listed above  Psychiatric: Judgement intact, Mood & affect appropriate for pt's clinical situation  Dermatologic: See M/S exam for extremity exam, No rashes otherwise noted  Lymph : Palpable lymph nodes: None   Non-Invasive Vascular Imaging  Vein Mapping  (Date: 03/20/2016):   R arm: acceptable  vein conduits include entire cephalic likely usable, likely upper arm basilic  L arm: acceptable vein conduits include none  BUE Doppler (Date: 03/20/2016):   R arm:   Brachial: tri, 5.3 mm  Radial: tri, 2.5 mm  Ulnar: tri, 3.4 mm  L arm:   Brachial: tri, 4.4 mm  Radial: tri, 2.2 mm  Ulnar: tri, 2.2 mm  Laboratory: CBC:    Component Value Date/Time   WBC 4.5 05/14/2015 1829   RBC 3.90 (L) 05/14/2015 1829   HGB 11.4 (L) 02/27/2016 0834   HCT 33.7 (L) 05/14/2015 1829   PLT 132 (L) 05/14/2015 1829   MCV 86.4 05/14/2015 1829   MCH 27.2 05/14/2015 1829   MCHC 31.5 05/14/2015 1829   RDW 15.2 05/14/2015 1829   LYMPHSABS 0.9 05/13/2015 1629   MONOABS 0.3 05/13/2015 1629   EOSABS 0.1 05/13/2015 1629   BASOSABS 0.0 05/13/2015 1629    BMP:    Component Value Date/Time   NA 143 05/17/2015 0735   K 4.3 05/17/2015 0735   CL 110 05/17/2015 0735   CO2 23 05/17/2015 0735   GLUCOSE 141 (H) 05/17/2015 0735   BUN 36 (H) 05/17/2015 0735   CREATININE 2.66 (H) 05/17/2015 0735   CALCIUM 8.4 (L) 05/17/2015 0735   GFRNONAA 22 (L) 05/17/2015 0735   GFRAA 25 (L) 05/17/2015 0735    Coagulation: Lab Results  Component Value Date   INR 0.98 05/13/2015   INR 1.08 08/19/2009   No results found for: PTT  Lipids:     Component Value Date/Time   CHOL 268 (H) 05/14/2015 0502   TRIG 699 (H) 05/14/2015 0502   HDL 30 (L) 05/14/2015 0502   CHOLHDL 8.9 05/14/2015 0502   VLDL UNABLE TO CALCULATE IF TRIGLYCERIDE OVER 400 mg/dL 05/14/2015 0502   LDLCALC UNABLE TO CALCULATE IF TRIGLYCERIDE OVER 400 mg/dL 05/14/2015 0502     Outside Studies/Documentation 5 pages of outside documents were reviewed including: outside nephrology chart including labs   Medical Decision Making  DALLEN BUNTE is a 81 y.o. male who presents with chronic kidney disease stage IV  Based on vein mapping and examination, this patient's permanent access options include: R RC vs BC AVF  I had an extensive discussion with this patient in regards to the nature of access surgery, including risk, benefits, and alternatives.    The patient is aware that the risks of access surgery include but are not limited to: bleeding, infection, steal syndrome, nerve damage, ischemic monomelic neuropathy, failure of access to mature, and possible need for additional access procedures in the future.  The patient has NOT agreed to proceed with the above procedure.  He will contact us if he agrees to proceed.  Adele Barthel, MD, FACS [facility] Office: [phone] Pager: [phone]  03/20/2016, 11:56 AM

## 2016-03-20 NOTE — Telephone Encounter (Signed)
Spoke with patient and he is going to see Dr Rayann Heman on Mon at 12:30

## 2016-03-20 NOTE — Telephone Encounter (Signed)
New message   Pt verbalized that rn needs to call him because he is confused with why he needs to see Dr.Allred and not Dr.Tilly

## 2016-03-20 NOTE — Telephone Encounter (Signed)
This encounter was created in error - please disregard.

## 2016-03-22 ENCOUNTER — Encounter: Payer: Self-pay | Admitting: *Deleted

## 2016-03-25 ENCOUNTER — Encounter: Payer: Self-pay | Admitting: Internal Medicine

## 2016-03-25 ENCOUNTER — Ambulatory Visit (INDEPENDENT_AMBULATORY_CARE_PROVIDER_SITE_OTHER): Payer: Medicare Other | Admitting: Internal Medicine

## 2016-03-25 VITALS — BP 140/64 | HR 70 | Ht 68.0 in | Wt 203.4 lb

## 2016-03-25 DIAGNOSIS — I10 Essential (primary) hypertension: Secondary | ICD-10-CM

## 2016-03-25 DIAGNOSIS — I4729 Other ventricular tachycardia: Secondary | ICD-10-CM

## 2016-03-25 DIAGNOSIS — I472 Ventricular tachycardia: Secondary | ICD-10-CM | POA: Diagnosis not present

## 2016-03-25 LAB — CUP PACEART INCLINIC DEVICE CHECK
Date Time Interrogation Session: 20180129155807
MDC IDC PG IMPLANT DT: 20170321

## 2016-03-25 NOTE — Patient Instructions (Signed)
Medication Instructions:  Your physician recommends that you continue on your current medications as directed. Please refer to the Current Medication list given to you today.   Labwork: Your physician recommends that you return for lab work today: BMP/Mag   Testing/Procedures: None ordered   Follow-Up: Your physician recommends that you schedule a follow-up appointment as needed   Any Other Special Instructions Will Be Listed Below (If Applicable).     If you need a refill on your cardiac medications before your next appointment, please call your pharmacy.

## 2016-03-25 NOTE — Progress Notes (Signed)
Electrophysiology Office Note   Date:  03/25/2016   ID:  Robert [name], DOB January 12, 1936, MRN [id number]  PCP:  Geoffery Lyons, MD  Cardiologist:  Dr Wynonia Lawman Primary Electrophysiologist: Thompson Grayer, MD    Chief Complaint  Patient presents with  . Pacemaker Check    Loop Recorder     History of Present Illness: Robert [name] is a 81 y.o. male who presents today for electrophysiology evaluation.   The patient is being followed remotely according to cryptogenic stroke protocol.  He has recently been observed to have increased NSVT burden.  Upon being seen in the clinic today, he reports that for about 3 weeks in January, he was out of his metoprolol.  This corresponds to the increased NSVT.  He was completely asymptomatic with episodes.  He remains reasonably active for his age.  Today, he denies symptoms of palpitations, chest pain, shortness of breath, orthopnea, PND, lower extremity edema, claudication, dizziness, presyncope, syncope, bleeding, or neurologic sequela. The patient is tolerating medications without difficulties and is otherwise without complaint today.    Past Medical History:  Diagnosis Date  . Allergy    year round  . Cataract   . Diabetes mellitus   . GERD (gastroesophageal reflux disease)   . Hearing deficit   . Hyperlipidemia   . Hypertension   . Myocardial infarction 2011  . Stroke (cerebrum) ([place])   . Thyroid disease    hypothyroid   Past Surgical History:  Procedure Laterality Date  . CORONARY ANGIOPLASTY WITH STENT PLACEMENT  07/2009   3 stents  . EP IMPLANTABLE DEVICE N/A 05/16/2015   Procedure: Loop Recorder Insertion;  Surgeon: Thompson Grayer, MD;  Location: [facility];  Service: Cardiovascular;  Laterality: N/A;  . insulin pump needs to be removed for mri    . stents    . TEE WITHOUT CARDIOVERSION N/A 05/16/2015   Procedure: TRANSESOPHAGEAL ECHOCARDIOGRAM (TEE);  Surgeon: Thayer Headings, MD;  Location: [facility] ENDOSCOPY;  Service:  Cardiovascular;  Laterality: N/A;     Current Outpatient Prescriptions  Medication Sig Dispense Refill  . albuterol (PROVENTIL HFA;VENTOLIN HFA) 108 (90 Base) MCG/ACT inhaler Inhale 2 puffs into the lungs every 6 (six) hours as needed for wheezing or shortness of breath.    [name] aspirin 325 MG EC tablet Take 325 mg by mouth daily.    . fexofenadine (ALLEGRA) 180 MG tablet Take 180 mg by mouth daily.    . furosemide (LASIX) 20 MG tablet Take 20 mg by mouth 2 (two) times daily.    . hydrALAZINE (APRESOLINE) 100 MG tablet Take 100mg  in am and 50mg  in noon and 100mg  in the pm    . Insulin Human (INSULIN PUMP) SOLN Inject into the skin. Take  40units  Basal and 36 units bolus, humalog is in the pump    . levothyroxine (SYNTHROID, LEVOTHROID) 50 MCG tablet Take 1 tablet by mouth daily.    . metoprolol succinate (TOPROL-XL) 50 MG 24 hr tablet 50 mg 2 (two) times daily.     . Multiple Vitamins-Minerals (MULTIVITAMIN WITH MINERALS) tablet Take 1 tablet by mouth daily.    [name] omeprazole (PRILOSEC) 20 MG capsule Take 1 capsule by mouth Daily.    . simvastatin (ZOCOR) 40 MG tablet Take 1 tablet (40 mg total) by mouth daily at 6 PM. 30 tablet 0   No current facility-administered medications for this visit.     Allergies:   Lisinopril   Social History:  The patient  reports that he has quit smoking. He has never used smokeless tobacco. He reports that he drinks about 1.2 oz of alcohol per week . He reports that he does not use drugs.   Family History:  The patient's family history includes Dementia in his maternal grandfather.    ROS:  Please see the history of present illness.   All other systems are reviewed and negative.    PHYSICAL EXAM: VS:  BP 140/64   Pulse 70   Ht 5\' 8"  (1.727 m)   Wt 203 lb 6.4 oz (92.3 kg)   BMI 30.93 kg/m  , BMI Body mass index is 30.93 kg/m. GEN: Well nourished, well developed, in no acute distress  HEENT: normal  Neck: no JVD, carotid bruits, or masses Cardiac:  RRR; no murmurs, rubs, or gallops,no edema  Respiratory:  clear to auscultation bilaterally, normal work of breathing GI: soft, nontender, nondistended, + BS MS: no deformity or atrophy  Skin: warm and dry  Neuro:  Strength and sensation are intact Psych: euthymic mood, full affect  EKG:  EKG is ordered today. The ekg ordered today shows sinus rhythm, no ishchemic changes  ILR is reviewed today   Recent Labs: 05/14/2015: ALT 14; Platelets 132; TSH 2.134 05/17/2015: BUN 36; Creatinine, Ser 2.66; Potassium 4.3; Sodium 143 02/27/2016: Hemoglobin 11.4    Lipid Panel     Component Value Date/Time   CHOL 268 (H) 05/14/2015 0502   TRIG 699 (H) 05/14/2015 0502   HDL 30 (L) 05/14/2015 0502   CHOLHDL 8.9 05/14/2015 0502   VLDL UNABLE TO CALCULATE IF TRIGLYCERIDE OVER 400 mg/dL 05/14/2015 0502   LDLCALC UNABLE TO CALCULATE IF TRIGLYCERIDE OVER 400 mg/dL 05/14/2015 0502     Wt Readings from Last 3 Encounters:  03/25/16 203 lb 6.4 oz (92.3 kg)  03/20/16 198 lb (89.8 kg)  08/22/15 195 lb (88.5 kg)      Other studies Reviewed: Additional studies/ records that were reviewed today include: ILR transmissions, prior echos, I have also spoken with Dr Wynonia Lawman today  Review of the above records today demonstrates: as above   ASSESSMENT AND PLAN:  1.  NSVT Recent increase in NSVT corresponds to nonadherance to metoprolol.  He has resumed metoprolol and episodes have reduced.  Previously, EF has been normal.  NSVT is known to Dr Wynonia Lawman who has advised a conservative approach. I will check electrolytes and tsh today.  Will continue supportive care unless he becomes symptomatic  2. Cryptogenic stroke No afib observed on ILR  3. HTN Stable No change required today  4. CAD Stable No change required today  Follow-up with Dr Wynonia Lawman as scheduled I will follow remotely and see as needed in the office.  Labs/ tests ordered today include:  Orders Placed This Encounter  Procedures  . Basic  metabolic panel  . Magnesium  . CUP PACEART [place]  . EKG 12-Lead     Signed, Thompson Grayer, MD  03/25/2016   [place] [address] [phone] (office) [phone] (fax)

## 2016-03-26 ENCOUNTER — Encounter (HOSPITAL_COMMUNITY)
Admission: RE | Admit: 2016-03-26 | Discharge: 2016-03-26 | Disposition: A | Discharge status: Home / Self Care | Payer: Medicare Other | Source: Ambulatory Visit | Attending: Nephrology | Admitting: Nephrology

## 2016-03-26 DIAGNOSIS — N184 Chronic kidney disease, stage 4 (severe): Secondary | ICD-10-CM | POA: Diagnosis not present

## 2016-03-26 LAB — MAGNESIUM: Magnesium: 1.2 mg/dL — ABNORMAL LOW (ref 1.6–2.3)

## 2016-03-26 LAB — BASIC METABOLIC PANEL
BUN/Creatinine Ratio: 19 (ref 10–24)
BUN: 60 mg/dL — ABNORMAL HIGH (ref 8–27)
CALCIUM: 7.9 mg/dL — AB (ref 8.6–10.2)
CHLORIDE: 107 mmol/L — AB (ref 96–106)
CO2: 21 mmol/L (ref 18–29)
Creatinine, Ser: 3.19 mg/dL — ABNORMAL HIGH (ref 0.76–1.27)
GFR calc Af Amer: 20 mL/min/{1.73_m2} — ABNORMAL LOW (ref >59)
GFR calc non Af Amer: 18 mL/min/{1.73_m2} — ABNORMAL LOW (ref >59)
Glucose: 287 mg/dL — ABNORMAL HIGH (ref 65–99)
POTASSIUM: 4 mmol/L (ref 3.5–5.2)
SODIUM: 144 mmol/L (ref 134–144)

## 2016-03-26 LAB — IRON AND TIBC
IRON: 52 ug/dL (ref 45–182)
Saturation Ratios: 17 % — ABNORMAL LOW (ref 17.9–39.5)
TIBC: 298 ug/dL (ref 250–450)
UIBC: 246 ug/dL

## 2016-03-26 LAB — FERRITIN: FERRITIN: 101 ng/mL (ref 24–336)

## 2016-03-26 MED ORDER — DARBEPOETIN ALFA 100 MCG/0.5ML IJ SOSY
100.0000 ug | PREFILLED_SYRINGE | INTRAMUSCULAR | Status: DC
  Administered 2016-03-26: 09:00:00 100 ug via SUBCUTANEOUS

## 2016-03-26 MED ORDER — DARBEPOETIN ALFA 100 MCG/0.5ML IJ SOSY
PREFILLED_SYRINGE | INTRAMUSCULAR | Status: AC
  Filled 2016-03-26: qty 0.5

## 2016-03-27 LAB — POCT HEMOGLOBIN-HEMACUE: Hemoglobin: 10.9 g/dL — ABNORMAL LOW (ref 13.0–17.0)

## 2016-03-31 LAB — CUP PACEART REMOTE DEVICE CHECK
Date Time Interrogation Session: 20171216193835
MDC IDC PG IMPLANT DT: 20170321

## 2016-03-31 NOTE — Progress Notes (Signed)
Carelink summary report received. Battery status OK. Normal device function. No new symptom episodes or brady episodes. No new AF episodes. 4 tachy- 3 w/ ECGs appear SVT, 2 pauses longest 4 sec's. (All episodes previously noted on in office check) Monthly summary reports and ROV/PRN

## 2016-04-02 ENCOUNTER — Other Ambulatory Visit: Payer: Self-pay | Admitting: Internal Medicine

## 2016-04-05 ENCOUNTER — Telehealth: Payer: Self-pay | Admitting: Internal Medicine

## 2016-04-05 NOTE — Telephone Encounter (Signed)
New message ° ° ° ° ° ° °Calling to get lab results °

## 2016-04-05 NOTE — Telephone Encounter (Signed)
patient aware of labs

## 2016-04-10 ENCOUNTER — Ambulatory Visit (INDEPENDENT_AMBULATORY_CARE_PROVIDER_SITE_OTHER): Payer: Medicare Other | Admitting: *Deleted

## 2016-04-10 DIAGNOSIS — I639 Cerebral infarction, unspecified: Secondary | ICD-10-CM

## 2016-04-11 NOTE — Progress Notes (Signed)
Carelink Summary Report / Loop Recorder 

## 2016-04-19 ENCOUNTER — Encounter: Payer: Self-pay | Admitting: Internal Medicine

## 2016-04-19 LAB — CUP PACEART REMOTE DEVICE CHECK
Date Time Interrogation Session: 20180115194123
Implantable Pulse Generator Implant Date: 20170321
MDC IDC PG IMPLANT DT: 20170321
MDC IDC SESS DTM: 20180214194724

## 2016-04-23 ENCOUNTER — Encounter (HOSPITAL_COMMUNITY)
Admission: RE | Admit: 2016-04-23 | Discharge: 2016-04-23 | Disposition: A | Discharge status: Home / Self Care | Payer: Medicare Other | Source: Ambulatory Visit | Attending: Nephrology | Admitting: Nephrology

## 2016-04-23 DIAGNOSIS — N184 Chronic kidney disease, stage 4 (severe): Secondary | ICD-10-CM | POA: Diagnosis present

## 2016-04-23 DIAGNOSIS — D638 Anemia in other chronic diseases classified elsewhere: Secondary | ICD-10-CM | POA: Diagnosis not present

## 2016-04-23 LAB — POCT HEMOGLOBIN-HEMACUE: Hemoglobin: 11.1 g/dL — ABNORMAL LOW (ref 13.0–17.0)

## 2016-04-23 MED ORDER — DARBEPOETIN ALFA 100 MCG/0.5ML IJ SOSY
100.0000 ug | PREFILLED_SYRINGE | INTRAMUSCULAR | Status: DC
  Administered 2016-04-23: 08:00:00 100 ug via SUBCUTANEOUS

## 2016-04-23 MED ORDER — DARBEPOETIN ALFA 100 MCG/0.5ML IJ SOSY
PREFILLED_SYRINGE | INTRAMUSCULAR | Status: AC
  Filled 2016-04-23: qty 0.5

## 2016-05-10 ENCOUNTER — Ambulatory Visit (INDEPENDENT_AMBULATORY_CARE_PROVIDER_SITE_OTHER): Payer: Medicare Other | Admitting: *Deleted

## 2016-05-10 DIAGNOSIS — I639 Cerebral infarction, unspecified: Secondary | ICD-10-CM | POA: Diagnosis not present

## 2016-05-13 NOTE — Progress Notes (Signed)
Carelink Summary Report / Loop Recorder 

## 2016-05-18 LAB — CUP PACEART REMOTE DEVICE CHECK
Date Time Interrogation Session: 20180316204112
MDC IDC PG IMPLANT DT: 20170321

## 2016-05-18 NOTE — Progress Notes (Signed)
Carelink summary report received. Battery status OK. Normal device function. No new symptom episodes, tachy episodes, brady, or pause episodes. No new AF episodes. Monthly summary reports and ROV/PRN 

## 2016-05-20 ENCOUNTER — Inpatient Hospital Stay (HOSPITAL_COMMUNITY)
Admission: EM | Admit: 2016-05-20 | Discharge: 2016-05-24 | DRG: 065 | Disposition: A | Discharge status: Skilled Nursing Facility | Payer: Medicare Other | Attending: Internal Medicine | Admitting: Internal Medicine

## 2016-05-20 ENCOUNTER — Observation Stay (HOSPITAL_COMMUNITY): Payer: Medicare Other

## 2016-05-20 ENCOUNTER — Encounter (HOSPITAL_COMMUNITY): Payer: Self-pay

## 2016-05-20 ENCOUNTER — Emergency Department (HOSPITAL_COMMUNITY): Payer: Medicare Other

## 2016-05-20 DIAGNOSIS — E039 Hypothyroidism, unspecified: Secondary | ICD-10-CM | POA: Diagnosis present

## 2016-05-20 DIAGNOSIS — N179 Acute kidney failure, unspecified: Secondary | ICD-10-CM | POA: Diagnosis present

## 2016-05-20 DIAGNOSIS — I160 Hypertensive urgency: Secondary | ICD-10-CM | POA: Diagnosis not present

## 2016-05-20 DIAGNOSIS — I5032 Chronic diastolic (congestive) heart failure: Secondary | ICD-10-CM | POA: Diagnosis not present

## 2016-05-20 DIAGNOSIS — E7800 Pure hypercholesterolemia, unspecified: Secondary | ICD-10-CM | POA: Diagnosis present

## 2016-05-20 DIAGNOSIS — N184 Chronic kidney disease, stage 4 (severe): Secondary | ICD-10-CM | POA: Diagnosis present

## 2016-05-20 DIAGNOSIS — I69354 Hemiplegia and hemiparesis following cerebral infarction affecting left non-dominant side: Secondary | ICD-10-CM

## 2016-05-20 DIAGNOSIS — D649 Anemia, unspecified: Secondary | ICD-10-CM | POA: Diagnosis present

## 2016-05-20 DIAGNOSIS — Z7982 Long term (current) use of aspirin: Secondary | ICD-10-CM

## 2016-05-20 DIAGNOSIS — R4781 Slurred speech: Secondary | ICD-10-CM | POA: Diagnosis not present

## 2016-05-20 DIAGNOSIS — Z794 Long term (current) use of insulin: Secondary | ICD-10-CM

## 2016-05-20 DIAGNOSIS — I110 Hypertensive heart disease with heart failure: Secondary | ICD-10-CM | POA: Diagnosis present

## 2016-05-20 DIAGNOSIS — D696 Thrombocytopenia, unspecified: Secondary | ICD-10-CM | POA: Diagnosis present

## 2016-05-20 DIAGNOSIS — E1165 Type 2 diabetes mellitus with hyperglycemia: Secondary | ICD-10-CM | POA: Diagnosis present

## 2016-05-20 DIAGNOSIS — I6521 Occlusion and stenosis of right carotid artery: Secondary | ICD-10-CM

## 2016-05-20 DIAGNOSIS — I69391 Dysphagia following cerebral infarction: Secondary | ICD-10-CM

## 2016-05-20 DIAGNOSIS — R471 Dysarthria and anarthria: Secondary | ICD-10-CM | POA: Diagnosis present

## 2016-05-20 DIAGNOSIS — N186 End stage renal disease: Secondary | ICD-10-CM | POA: Diagnosis present

## 2016-05-20 DIAGNOSIS — R1310 Dysphagia, unspecified: Secondary | ICD-10-CM | POA: Diagnosis present

## 2016-05-20 DIAGNOSIS — E669 Obesity, unspecified: Secondary | ICD-10-CM | POA: Diagnosis present

## 2016-05-20 DIAGNOSIS — Z9114 Patient's other noncompliance with medication regimen: Secondary | ICD-10-CM

## 2016-05-20 DIAGNOSIS — I69322 Dysarthria following cerebral infarction: Secondary | ICD-10-CM

## 2016-05-20 DIAGNOSIS — E119 Type 2 diabetes mellitus without complications: Secondary | ICD-10-CM

## 2016-05-20 DIAGNOSIS — N2581 Secondary hyperparathyroidism of renal origin: Secondary | ICD-10-CM | POA: Diagnosis present

## 2016-05-20 DIAGNOSIS — E1122 Type 2 diabetes mellitus with diabetic chronic kidney disease: Secondary | ICD-10-CM | POA: Diagnosis present

## 2016-05-20 DIAGNOSIS — Z992 Dependence on renal dialysis: Secondary | ICD-10-CM

## 2016-05-20 DIAGNOSIS — I639 Cerebral infarction, unspecified: Secondary | ICD-10-CM

## 2016-05-20 DIAGNOSIS — N289 Disorder of kidney and ureter, unspecified: Secondary | ICD-10-CM

## 2016-05-20 DIAGNOSIS — I63231 Cerebral infarction due to unspecified occlusion or stenosis of right carotid arteries: Secondary | ICD-10-CM | POA: Diagnosis not present

## 2016-05-20 DIAGNOSIS — I252 Old myocardial infarction: Secondary | ICD-10-CM

## 2016-05-20 DIAGNOSIS — E785 Hyperlipidemia, unspecified: Secondary | ICD-10-CM | POA: Diagnosis present

## 2016-05-20 DIAGNOSIS — Z8673 Personal history of transient ischemic attack (TIA), and cerebral infarction without residual deficits: Secondary | ICD-10-CM | POA: Diagnosis present

## 2016-05-20 DIAGNOSIS — I10 Essential (primary) hypertension: Secondary | ICD-10-CM | POA: Diagnosis not present

## 2016-05-20 DIAGNOSIS — K219 Gastro-esophageal reflux disease without esophagitis: Secondary | ICD-10-CM | POA: Diagnosis present

## 2016-05-20 DIAGNOSIS — Z79899 Other long term (current) drug therapy: Secondary | ICD-10-CM

## 2016-05-20 DIAGNOSIS — Z87891 Personal history of nicotine dependence: Secondary | ICD-10-CM

## 2016-05-20 DIAGNOSIS — I130 Hypertensive heart and chronic kidney disease with heart failure and stage 1 through stage 4 chronic kidney disease, or unspecified chronic kidney disease: Secondary | ICD-10-CM | POA: Diagnosis present

## 2016-05-20 DIAGNOSIS — Z6829 Body mass index (BMI) 29.0-29.9, adult: Secondary | ICD-10-CM

## 2016-05-20 DIAGNOSIS — R29810 Facial weakness: Secondary | ICD-10-CM | POA: Diagnosis present

## 2016-05-20 DIAGNOSIS — Z955 Presence of coronary angioplasty implant and graft: Secondary | ICD-10-CM

## 2016-05-20 DIAGNOSIS — I2510 Atherosclerotic heart disease of native coronary artery without angina pectoris: Secondary | ICD-10-CM | POA: Diagnosis present

## 2016-05-20 LAB — COMPREHENSIVE METABOLIC PANEL
ALK PHOS: 84 U/L (ref 38–126)
ALT: 19 U/L (ref 17–63)
AST: 19 U/L (ref 15–41)
Albumin: 3 g/dL — ABNORMAL LOW (ref 3.5–5.0)
Anion gap: 11 (ref 5–15)
BILIRUBIN TOTAL: 0.4 mg/dL (ref 0.3–1.2)
BUN: 53 mg/dL — AB (ref 6–20)
CALCIUM: 8.5 mg/dL — AB (ref 8.9–10.3)
CHLORIDE: 107 mmol/L (ref 101–111)
CO2: 21 mmol/L — ABNORMAL LOW (ref 22–32)
CREATININE: 4.31 mg/dL — AB (ref 0.61–1.24)
GFR calc Af Amer: 14 mL/min — ABNORMAL LOW (ref >60)
GFR, EST NON AFRICAN AMERICAN: 12 mL/min — AB (ref >60)
Glucose, Bld: 262 mg/dL — ABNORMAL HIGH (ref 65–99)
Potassium: 4.5 mmol/L (ref 3.5–5.1)
Sodium: 139 mmol/L (ref 135–145)
TOTAL PROTEIN: 5.7 g/dL — AB (ref 6.5–8.1)

## 2016-05-20 LAB — I-STAT CHEM 8, ED
BUN: 52 mg/dL — AB (ref 6–20)
CREATININE: 4.5 mg/dL — AB (ref 0.61–1.24)
Calcium, Ion: 1.11 mmol/L — ABNORMAL LOW (ref 1.15–1.40)
Chloride: 105 mmol/L (ref 101–111)
GLUCOSE: 264 mg/dL — AB (ref 65–99)
HEMATOCRIT: 35 % — AB (ref 39.0–52.0)
Hemoglobin: 11.9 g/dL — ABNORMAL LOW (ref 13.0–17.0)
Potassium: 4.5 mmol/L (ref 3.5–5.1)
Sodium: 140 mmol/L (ref 135–145)
TCO2: 25 mmol/L (ref 0–100)

## 2016-05-20 LAB — CBC
HEMATOCRIT: 37.3 % — AB (ref 39.0–52.0)
HEMOGLOBIN: 12.3 g/dL — AB (ref 13.0–17.0)
MCH: 28.7 pg (ref 26.0–34.0)
MCHC: 33 g/dL (ref 30.0–36.0)
MCV: 86.9 fL (ref 78.0–100.0)
Platelets: 145 10*3/uL — ABNORMAL LOW (ref 150–400)
RBC: 4.29 MIL/uL (ref 4.22–5.81)
RDW: 15.3 % (ref 11.5–15.5)
WBC: 7 10*3/uL (ref 4.0–10.5)

## 2016-05-20 LAB — DIFFERENTIAL
BASOS ABS: 0.1 10*3/uL (ref 0.0–0.1)
Basophils Relative: 1 %
Eosinophils Absolute: 0.2 10*3/uL (ref 0.0–0.7)
Eosinophils Relative: 3 %
LYMPHS ABS: 1 10*3/uL (ref 0.7–4.0)
LYMPHS PCT: 15 %
MONO ABS: 0.8 10*3/uL (ref 0.1–1.0)
Monocytes Relative: 11 %
NEUTROS ABS: 5 10*3/uL (ref 1.7–7.7)
Neutrophils Relative %: 70 %

## 2016-05-20 LAB — I-STAT TROPONIN, ED: TROPONIN I, POC: 0.01 ng/mL (ref 0.00–0.08)

## 2016-05-20 LAB — PROTIME-INR
INR: 0.99
Prothrombin Time: 13.1 seconds (ref 11.4–15.2)

## 2016-05-20 LAB — CBG MONITORING, ED: Glucose-Capillary: 259 mg/dL — ABNORMAL HIGH (ref 65–99)

## 2016-05-20 LAB — APTT: APTT: 24 s (ref 24–36)

## 2016-05-20 MED ORDER — ALBUTEROL SULFATE (2.5 MG/3ML) 0.083% IN NEBU: 3.0000 mL | INHALATION_SOLUTION | Freq: Four times a day (QID) | RESPIRATORY_TRACT | Status: DC | PRN

## 2016-05-20 MED ORDER — LEVOTHYROXINE SODIUM 50 MCG PO TABS
50.0000 ug | ORAL_TABLET | Freq: Every day | ORAL | Status: DC
  Administered 2016-05-21 – 2016-05-24 (×4): 50 ug via ORAL
  Filled 2016-05-20 (×5): qty 1

## 2016-05-20 MED ORDER — ASPIRIN 325 MG PO TABS
325.0000 mg | ORAL_TABLET | Freq: Every day | ORAL | Status: DC
  Administered 2016-05-21 – 2016-05-24 (×4): 325 mg via ORAL
  Filled 2016-05-20 (×4): qty 1

## 2016-05-20 MED ORDER — INSULIN GLARGINE 100 UNIT/ML ~~LOC~~ SOLN
35.0000 [IU] | Freq: Every day | SUBCUTANEOUS | Status: DC
  Administered 2016-05-21 – 2016-05-23 (×4): 35 [IU] via SUBCUTANEOUS
  Filled 2016-05-20 (×5): qty 0.35

## 2016-05-20 MED ORDER — SODIUM CHLORIDE 0.9 % IV SOLN
INTRAVENOUS | Status: AC
  Administered 2016-05-21: 02:00:00 via INTRAVENOUS

## 2016-05-20 MED ORDER — ASPIRIN 300 MG RE SUPP
300.0000 mg | Freq: Every day | RECTAL | Status: DC
  Filled 2016-05-20 (×2): qty 1

## 2016-05-20 MED ORDER — PANTOPRAZOLE SODIUM 40 MG PO TBEC
40.0000 mg | DELAYED_RELEASE_TABLET | Freq: Every day | ORAL | Status: DC
  Administered 2016-05-21 – 2016-05-24 (×4): 40 mg via ORAL
  Filled 2016-05-20 (×4): qty 1

## 2016-05-20 MED ORDER — ACETAMINOPHEN 325 MG PO TABS: 650.0000 mg | ORAL_TABLET | ORAL | Status: DC | PRN

## 2016-05-20 MED ORDER — CALCITRIOL 0.25 MCG PO CAPS
0.2500 ug | ORAL_CAPSULE | ORAL | Status: DC
  Administered 2016-05-22 – 2016-05-24 (×2): 0.25 ug via ORAL
  Filled 2016-05-20 (×2): qty 1

## 2016-05-20 MED ORDER — INSULIN ASPART 100 UNIT/ML ~~LOC~~ SOLN
0.0000 [IU] | SUBCUTANEOUS | Status: DC
  Administered 2016-05-21 (×2): 8 [IU] via SUBCUTANEOUS

## 2016-05-20 MED ORDER — LABETALOL HCL 5 MG/ML IV SOLN: 5.0000 mg | INTRAVENOUS | Status: DC | PRN

## 2016-05-20 MED ORDER — ACETAMINOPHEN 650 MG RE SUPP: 650.0000 mg | RECTAL | Status: DC | PRN

## 2016-05-20 MED ORDER — ACETAMINOPHEN 160 MG/5ML PO SOLN: 650.0000 mg | ORAL | Status: DC | PRN

## 2016-05-20 MED ORDER — STROKE: EARLY STAGES OF RECOVERY BOOK
Freq: Once | Status: AC
  Administered 2016-05-21: 02:00:00
  Filled 2016-05-20: qty 1

## 2016-05-20 MED ORDER — SENNOSIDES-DOCUSATE SODIUM 8.6-50 MG PO TABS
1.0000 | ORAL_TABLET | Freq: Every evening | ORAL | Status: DC | PRN
  Filled 2016-05-20: qty 1

## 2016-05-20 MED ORDER — SIMVASTATIN 40 MG PO TABS
40.0000 mg | ORAL_TABLET | Freq: Every day | ORAL | Status: DC
  Administered 2016-05-21 – 2016-05-23 (×3): 40 mg via ORAL
  Filled 2016-05-20 (×3): qty 1

## 2016-05-20 MED ORDER — HEPARIN SODIUM (PORCINE) 5000 UNIT/ML IJ SOLN
5000.0000 [IU] | Freq: Three times a day (TID) | INTRAMUSCULAR | Status: DC
  Administered 2016-05-21 – 2016-05-24 (×11): 5000 [IU] via SUBCUTANEOUS
  Filled 2016-05-20 (×11): qty 1

## 2016-05-20 MED ORDER — ADULT MULTIVITAMIN W/MINERALS CH
1.0000 | ORAL_TABLET | Freq: Every day | ORAL | Status: DC
  Administered 2016-05-21 – 2016-05-24 (×4): 1 via ORAL
  Filled 2016-05-20 (×4): qty 1

## 2016-05-20 NOTE — ED Notes (Signed)
Dr. Eulis Foster to call code stroke

## 2016-05-20 NOTE — ED Notes (Signed)
Patient transported to MRI 

## 2016-05-20 NOTE — Consult Note (Signed)
Referring Physician: Dr. Myna Hidalgo    Chief Complaint: Acute onset of hoarseness and slurred speech  HPI: Robert [name] is an 81 y.o. male who was LKN at "about 1:30 before taking a nap". On awakening, his voice was hoarse. He drove to his daughter's house; she felt that his speech was slurred and that he was having a stroke. She drove him to the [facility] ED where a Code Stroke was called.   At time of CT scan, the patient denies any weakness. He also denies vision changes. The patient states that he has acid reflux but has not become hoarse from this in the past.   LSN: 1:30 PM tPA Given: No, out of IV tPA time window NIHSS: 2 (mild dysarthria and left hand sensory impairment)  Past Medical History:  Diagnosis Date  . Allergy    year round  . Cataract   . Diabetes mellitus   . GERD (gastroesophageal reflux disease)   . Hearing deficit   . Hyperlipidemia   . Hypertension   . Myocardial infarction 2011  . Stroke (cerebrum) ([place])   . Thyroid disease    hypothyroid    Past Surgical History:  Procedure Laterality Date  . CORONARY ANGIOPLASTY WITH STENT PLACEMENT  07/2009   3 stents  . EP IMPLANTABLE DEVICE N/A 05/16/2015   Procedure: Loop Recorder Insertion;  Surgeon: Thompson Grayer, MD;  Location: [facility];  Service: Cardiovascular;  Laterality: N/A;  . insulin pump needs to be removed for mri    . stents    . TEE WITHOUT CARDIOVERSION N/A 05/16/2015   Procedure: TRANSESOPHAGEAL ECHOCARDIOGRAM (TEE);  Surgeon: Thayer Headings, MD;  Location: [facility] ENDOSCOPY;  Service: Cardiovascular;  Laterality: N/A;    Family History  Problem Relation Age of Onset  . Dementia Maternal Grandfather    Social History:  reports that he has quit smoking. He has never used smokeless tobacco. He reports that he drinks about 1.2 oz of alcohol per week . He reports that he does not use drugs.  Allergies:  Allergies  Allergen Reactions  . Lisinopril Other (See Comments)    "made me lose my voice"     Medications:  albuterol (PROVENTIL HFA;VENTOLIN HFA) 108 (90 Base) MCG/ACT inhaler Inhale 2 puffs into the lungs every 6 (six) hours as needed for wheezing or shortness of breath. Vianne Bulls, MD Reordered  Orderedas:albuterol (PROVENTIL) (2.5 MG/3ML) 0.083% nebulizer solution 3 mL - 3 mL, Inhalation, Every 6 hours PRN, wheezing, shortness of breath, Starting Mon 05/20/16 at 2322  aspirin 325 MG EC tablet Take 325 mg by mouth daily. Vianne Bulls, MD Not Ordered  calcitRIOL (ROCALTROL) 0.25 MCG capsule Take 0.25 mcg by mouth every Monday, Wednesday, and Friday. Vianne Bulls, MD Reordered  Orderedas:calcitRIOL (ROCALTROL) capsule 0.25 mcg - 0.25 mcg, Oral, Every M-W-F, First dose on Wed 05/22/16 at 1000  furosemide (LASIX) 40 MG tablet Take 40 mg by mouth 2 (two) times daily. Vianne Bulls, MD Not Ordered  hydrALAZINE (APRESOLINE) 100 MG tablet Take 100 mg by mouth 3 (three) times daily.  Vianne Bulls, MD Not Ordered  Insulin Human (INSULIN PUMP) SOLN Inject 1 each into the skin. Take 40 units Basal and 36 units bolus, humalog is in the pump Vianne Bulls, MD Not Ordered  levothyroxine (SYNTHROID, LEVOTHROID) 50 MCG tablet Take 50 mcg by mouth daily before breakfast.  Vianne Bulls, MD Reordered  Orderedas:levothyroxine (SYNTHROID, LEVOTHROID) tablet 50 mcg - 50 mcg, Oral,  Daily before breakfast, First dose on Tue 05/21/16 at 0800  metoprolol succinate (TOPROL-XL) 50 MG 24 hr tablet Take 50 mg by mouth 2 (two) times daily.  Vianne Bulls, MD Not Ordered  Multiple Vitamins-Minerals (MULTIVITAMIN WITH MINERALS) tablet Take 1 tablet by mouth daily. Vianne Bulls, MD Reordered  Orderedas:multivitamin with minerals tablet 1 tablet - 1 tablet, Oral, Daily, First dose on Tue 05/21/16 at 1000  omeprazole (PRILOSEC) 20 MG capsule Take 1 capsule by mouth Daily. Vianne Bulls, MD Reordered  Orderedas:pantoprazole (PROTONIX) EC tablet 40 mg - 40 mg, Oral, Daily, First dose on Tue 05/21/16  at 1000  simvastatin (ZOCOR) 40 MG tablet Take 1 tablet (40 mg total) by mouth daily at 6 PM. Vianne Bulls, MD Reordered  Orderedas:simvastatin (ZOCOR) tablet 40 mg - 40 mg, Oral, Daily-1800, First dose on Tue 05/21/16 at 1800    ROS: No headache, chest pain or limb pain. Other ROS as per HPI.   Physical Examination: Blood pressure (!) 184/78, pulse 63, temperature 97.7 F (36.5 C), temperature source Oral, resp. rate (!) 22, SpO2 97 %.  HEENT: /AT Lungs: Respirations unlabored Ext: Warm and well perfused  Neurologic Examination: Mental Status: Alert, fully oriented, thought content appropriate.  Mildly dysarthric speech with subtle ataxic quality. Speech fluent with intact naming, repetition and comprehension.  Cranial Nerves: II:  Visual fields grossly normal with no extinction to double simultaneous stimulation. PERRL  III,IV, VI: ptosis not present, EOMI without nystagmus V,VII: Smile and grimace are symmetric. Cheek-puff is normal. Facial light touch and temp sensation normal bilaterally VIII: hearing intact to questions and commands IX,X: Hoarseness noted. Palate elevates normally bilaterally. XI: Symmetric  XII: midline tongue extension  Motor:  Right : Upper extremity   5/5    Left:     Upper extremity   5/5  Lower extremity   5/5     Lower extremity   5/5 Sensory: Subjectively decreased temperature sensation to left hand and forearm. FT intact x 4 without extinction.  Deep Tendon Reflexes:  3+ bilateral brachioradialis, biceps, patellae and achilles Cerebellar: No ataxia with FNF or heel-shin test bilaterally Gait: Deferred   Results for orders placed or performed during the hospital encounter of 05/20/16 (from the past 48 hour(s))  Protime-INR     Status: None   Collection Time: 05/20/16  8:43 PM  Result Value Ref Range   Prothrombin Time 13.1 11.4 - 15.2 seconds   INR 0.99   APTT     Status: None   Collection Time: 05/20/16  8:43 PM  Result Value Ref Range    aPTT 24 24 - 36 seconds  Comprehensive metabolic panel     Status: Abnormal   Collection Time: 05/20/16  8:43 PM  Result Value Ref Range   Sodium 139 135 - 145 mmol/L   Potassium 4.5 3.5 - 5.1 mmol/L   Chloride 107 101 - 111 mmol/L   CO2 21 (L) 22 - 32 mmol/L   Glucose, Bld 262 (H) 65 - 99 mg/dL   BUN 53 (H) 6 - 20 mg/dL   Creatinine, Ser 4.31 (H) 0.61 - 1.24 mg/dL   Calcium 8.5 (L) 8.9 - 10.3 mg/dL   Total Protein 5.7 (L) 6.5 - 8.1 g/dL   Albumin 3.0 (L) 3.5 - 5.0 g/dL   AST 19 15 - 41 U/L   ALT 19 17 - 63 U/L   Alkaline Phosphatase 84 38 - 126 U/L   Total Bilirubin 0.4 0.3 - 1.2  mg/dL   GFR calc non Af Amer 12 (L) >60 mL/min   GFR calc Af Amer 14 (L) >60 mL/min    Comment: (NOTE) The eGFR has been calculated using the CKD EPI equation. This calculation has not been validated in all clinical situations. eGFR's persistently <60 mL/min signify possible Chronic Kidney Disease.    Anion gap 11 5 - 15  I-stat troponin, ED     Status: None   Collection Time: 05/20/16  8:59 PM  Result Value Ref Range   Troponin i, poc 0.01 0.00 - 0.08 ng/mL   Comment 3            Comment: Due to the release kinetics of cTnI, a negative result within the first hours of the onset of symptoms does not rule out myocardial infarction with certainty. If myocardial infarction is still suspected, repeat the test at appropriate intervals.   CBG monitoring, ED     Status: Abnormal   Collection Time: 05/20/16  9:00 PM  Result Value Ref Range   Glucose-Capillary 259 (H) 65 - 99 mg/dL  I-Stat Chem 8, ED     Status: Abnormal   Collection Time: 05/20/16  9:00 PM  Result Value Ref Range   Sodium 140 135 - 145 mmol/L   Potassium 4.5 3.5 - 5.1 mmol/L   Chloride 105 101 - 111 mmol/L   BUN 52 (H) 6 - 20 mg/dL   Creatinine, Ser 4.50 (H) 0.61 - 1.24 mg/dL   Glucose, Bld 264 (H) 65 - 99 mg/dL   Calcium, Ion 1.11 (L) 1.15 - 1.40 mmol/L   TCO2 25 0 - 100 mmol/L   Hemoglobin 11.9 (L) 13.0 - 17.0 g/dL   HCT  35.0 (L) 39.0 - 52.0 %   Ct Head Code Stroke W/o Cm  Result Date: 05/20/2016 CLINICAL DATA:  Code stroke. Left-sided facial droop and slurred speech. EXAM: CT HEAD WITHOUT CONTRAST TECHNIQUE: Contiguous axial images were obtained from the base of the skull through the vertex without intravenous contrast. COMPARISON:  05/13/2015 FINDINGS: Brain: No evidence of acute infarction, hemorrhage, hydrocephalus, extra-axial collection or mass lesion/mass effect. There is a moderate area of remote infarct in the high right frontal cortex and white matter. Smaller right frontal/parietal infarct. Vascular: Extensive calcified atherosclerotic plaque. No hyperdense vessel. There is asymmetric sulcal calcification on the right, stable from prior and associated with remote infarcts- suspected remote embolic calcified plaque. Skull: No acute or aggressive finding Sinuses/Orbits: No acute finding Other: Text page with results sent on 05/20/2016 at 8:32 pm to Dr. Cheral Marker. ASPECTS [facility] Stroke Program Early CT Score) Not scored in the setting of remote right frontal infarct. IMPRESSION: 1. No acute finding. 2. Remote right frontal infarcts. Associated sulcal calcification suggesting remote calcified plaque embolus. Electronically Signed   By: Monte Fantasia M.D.   On: 05/20/2016 20:36    Assessment: 81 y.o. male with possible stroke versus hoarseness due to gastroesophageal reflux 1. Hoarseness could occur with lateral medullary syndrome, but other findings such as ataxia and Horner's syndrome are not present on this patient's examination. Subtle dysarthria and scanning speech could localize to the cerebellum. Out of IV tPA time window. NIHSS of 2 with relatively minor deficits; no indication for endovascular intervention given risk/benefit ratio in the context of renal impairment.  2. CT head reveals no acute findings. Remote right frontal infarcts are noted. Finding suggestive of a remote calcified plaque embolus is also  seen.  3. Stroke Risk Factors - DM, HLD, HTN, prior  MI, prior stroke 4. Labs suggestive of AKI on possible CKD. No prior diagnosis of such listed in his PMHx.   Plan: 1. HgbA1c, fasting lipid panel 2. Has history of insulin pump and loop recorder. If either or both devices are still implanted, will need model numbers to determine if they are MRI compatible. If compatible, obtain MRI and MRA of the brain without contrast. If not compatible, obtain repeat CT head in 3 days. Unable to perform CTA due to renal impairment.  3. Carotid ultrasound.  4. Echocardiogram 5. PT consult, OT consult, Speech consult 6. ASA 81 mg po qd 7. BP management. Symptoms also could be secondary to hypertensive encephalopathy. 8. Telemetry monitoring 9. Frequent neuro checks 10. ENT consult to assess for possible structural etiology for the patient's new onset hoarseness  11. Evaluation and management of renal impairment 12. Continue Zocor.   '@Electronically'  signed: Dr. Kerney Elbe 05/20/2016, 10:22 PM

## 2016-05-20 NOTE — Code Documentation (Signed)
Code stroke called at 2018 for this  81 y/o  [place] male pt who states he was his normal self before he lay down for a nap around 1500 hrs.  Upon awaking at 1745 he noted his speech to be slurred , his voice hoarse and iritating cough.  He drove himself to his daughter's home, she felt he was having a stroke so she drove him to [facility] arriving at 2009.  He was cleared for CT by Dr Eulis Foster at  2016, arriving CT scan at 2020.  CT scan negative for acute findings. CBG 259   NIHSS scored 1 given for minor dysarthria.  Code stroke was cancelled by Dr Cheral Marker at  2039.  Pt to be admitted to medicine service.

## 2016-05-20 NOTE — H&P (Signed)
History and Physical    TRU RANA GQQ:[id number] DOB: 20-Oct-1935 DOA: 05/20/2016  PCP: Geoffery Lyons, MD   Patient coming from: Home  Chief Complaint: Dysarthria, left facial droop   HPI: Robert [name] is a 81 y.o. male with medical history significant for hypertension, insulin-dependent diabetes mellitus, chronic diastolic CHF, history of cryptogenic CVA, and chronic kidney disease stage IV who presents to the emergency department with slurred speech, difficulty eating, and left facial droop. Patient reports that he was having an uneventful day this morning, and was doing some work on a computer at home in the afternoon when he decided to take a nap at approximately 3 PM. He reports waking at approximately 5:45 PM with a sensation of something stuck in his throat that he cannot clear. He then noted his speech to be slurred. He denied any associated chest pain or palpitations and denies any headache, change in vision or hearing, or focal numbness or weakness. There been no recent fall or trauma and he denies use of illicit substances or alcohol. He reports similar symptoms at time of his prior stroke. Patient reports going to a restaurant right after noticing the symptoms, had some difficulties swallowing and noted that the kept biting his tongue. He then went to visit a grandchild, but was noted by his daughter to have slurred speech and left facial droop and was taken to the ED instead.  ED Course: Upon arrival to the ED, patient is found to be afebrile, saturating well on room air, hypertensive to 210/95, and with vitals otherwise normal. EKG features a sinus rhythm with repolarization abnormality and no significant change from prior. Noncontrast head CT is negative for acute intracranial abnormality but notable for remote right frontal infarcts. Chemistry panel features a BUN of 53 and serum creatinine 4.31, up from 3.2 in January of this year. CBC is notable for a slight anemia at 12.3  and a stable chronic thrombocytopenia with platelets 145,000. INR is within normal limits and troponin is also normal. Code stroke was activated and the patient was evaluated by neurology in the emergency department. A medical admission was advised for further evaluation and management of dysarthria with dysphagia and transient left facial droop concerning for possible TIA/CVA.  Review of Systems:  All other systems reviewed and apart from HPI, are negative.  Past Medical History:  Diagnosis Date  . Allergy    year round  . Cataract   . Diabetes mellitus   . GERD (gastroesophageal reflux disease)   . Hearing deficit   . Hyperlipidemia   . Hypertension   . Myocardial infarction 2011  . Stroke (cerebrum) ([place])   . Thyroid disease    hypothyroid    Past Surgical History:  Procedure Laterality Date  . CORONARY ANGIOPLASTY WITH STENT PLACEMENT  07/2009   3 stents  . EP IMPLANTABLE DEVICE N/A 05/16/2015   Procedure: Loop Recorder Insertion;  Surgeon: Thompson Grayer, MD;  Location: [facility];  Service: Cardiovascular;  Laterality: N/A;  . insulin pump needs to be removed for mri    . stents    . TEE WITHOUT CARDIOVERSION N/A 05/16/2015   Procedure: TRANSESOPHAGEAL ECHOCARDIOGRAM (TEE);  Surgeon: Thayer Headings, MD;  Location: [facility] ENDOSCOPY;  Service: Cardiovascular;  Laterality: N/A;     reports that he has quit smoking. He has never used smokeless tobacco. He reports that he drinks about 1.2 oz of alcohol per week . He reports that he does not use drugs.  Allergies  Allergen Reactions  . Lisinopril Other (See Comments)    "made me lose my voice"    Family History  Problem Relation Age of Onset  . Dementia Maternal Grandfather      Prior to Admission medications   Medication Sig Start Date End Date Taking? Authorizing Provider  albuterol (PROVENTIL HFA;VENTOLIN HFA) 108 (90 Base) MCG/ACT inhaler Inhale 2 puffs into the lungs every 6 (six) hours as needed for wheezing or  shortness of breath.   Yes Historical Provider, MD  aspirin 325 MG EC tablet Take 325 mg by mouth daily.   Yes Historical Provider, MD  calcitRIOL (ROCALTROL) 0.25 MCG capsule Take 0.25 mcg by mouth every Monday, Wednesday, and Friday.   Yes Historical Provider, MD  furosemide (LASIX) 40 MG tablet Take 40 mg by mouth 2 (two) times daily.   Yes Historical Provider, MD  hydrALAZINE (APRESOLINE) 100 MG tablet Take 100 mg by mouth 3 (three) times daily.  06/13/15  Yes Historical Provider, MD  Insulin Human (INSULIN PUMP) SOLN Inject 1 each into the skin. Take 40 units Basal and 36 units bolus, humalog is in the pump   Yes Historical Provider, MD  levothyroxine (SYNTHROID, LEVOTHROID) 50 MCG tablet Take 50 mcg by mouth daily before breakfast.  03/03/15  Yes Historical Provider, MD  metoprolol succinate (TOPROL-XL) 50 MG 24 hr tablet Take 50 mg by mouth 2 (two) times daily.  07/12/15  Yes Historical Provider, MD  Multiple Vitamins-Minerals (MULTIVITAMIN WITH MINERALS) tablet Take 1 tablet by mouth daily.   Yes Historical Provider, MD  omeprazole (PRILOSEC) 20 MG capsule Take 1 capsule by mouth Daily. 04/20/11  Yes Historical Provider, MD  simvastatin (ZOCOR) 40 MG tablet Take 1 tablet (40 mg total) by mouth daily at 6 PM. 05/17/15  Yes Velvet Bathe, MD    Physical Exam: Vitals:   05/20/16 2103 05/20/16 2115 05/20/16 2130 05/20/16 2240  BP: (!) 205/76 (!) 184/78 (!) 195/96 (!) 207/80  Pulse: 71 63 71 69  Resp: (!) 22 (!) 22 20 (!) 24  Temp:    97.8 F (36.6 C)  TempSrc:    Oral  SpO2: 98% 97% 99% 95%      Constitutional: NAD, calm, comfortable Eyes: PERTLA, lids and conjunctivae normal ENMT: Mucous membranes are moist. Posterior pharynx clear of any exudate or lesions.   Neck: normal, supple, no masses, no thyromegaly Respiratory: clear to auscultation bilaterally, no wheezing, no crackles. Normal respiratory effort.   Cardiovascular: S1 & S2 heard, regular rate and rhythm. No extremity edema. No  significant JVD. Abdomen: No distension, no tenderness, no masses palpated. Bowel sounds normal.  Musculoskeletal: no clubbing / cyanosis. No joint deformity upper and lower extremities.   Skin: no significant rashes, lesions, ulcers. Warm, dry, well-perfused. Neurologic: No gross facial asymmetry. Slight dysarthria. Sensation intact, patellar DTR normal. Strength 5/5 in all 4 limbs.  Psychiatric: Normal judgment and insight. Alert and oriented x 3. Normal mood and affect.     Labs on Admission: I have personally reviewed following labs and imaging studies  CBC:  Recent Labs Lab 05/20/16 2043 05/20/16 2100  WBC 7.0  --   NEUTROABS 5.0  --   HGB 12.3* 11.9*  HCT 37.3* 35.0*  MCV 86.9  --   PLT 145*  --    Basic Metabolic Panel:  Recent Labs Lab 05/20/16 2043 05/20/16 2100  NA 139 140  K 4.5 4.5  CL 107 105  CO2 21*  --   GLUCOSE 262* 264*  BUN 53* 52*  CREATININE 4.31* 4.50*  CALCIUM 8.5*  --    GFR: CrCl cannot be calculated (Unknown ideal weight.). Liver Function Tests:  Recent Labs Lab 05/20/16 2043  AST 19  ALT 19  ALKPHOS 84  BILITOT 0.4  PROT 5.7*  ALBUMIN 3.0*   No results for input(s): LIPASE, AMYLASE in the last 168 hours. No results for input(s): AMMONIA in the last 168 hours. Coagulation Profile:  Recent Labs Lab 05/20/16 2043  INR 0.99   Cardiac Enzymes: No results for input(s): CKTOTAL, CKMB, CKMBINDEX, TROPONINI in the last 168 hours. BNP (last 3 results) No results for input(s): PROBNP in the last 8760 hours. HbA1C: No results for input(s): HGBA1C in the last 72 hours. CBG:  Recent Labs Lab 05/20/16 2100  GLUCAP 259*   Lipid Profile: No results for input(s): CHOL, HDL, LDLCALC, TRIG, CHOLHDL, LDLDIRECT in the last 72 hours. Thyroid Function Tests: No results for input(s): TSH, T4TOTAL, FREET4, T3FREE, THYROIDAB in the last 72 hours. Anemia Panel: No results for input(s): VITAMINB12, FOLATE, FERRITIN, TIBC, IRON, RETICCTPCT  in the last 72 hours. Urine analysis:    Component Value Date/Time   COLORURINE YELLOW 05/13/2015 1931   APPEARANCEUR CLEAR 05/13/2015 1931   LABSPEC 1.018 05/13/2015 1931   PHURINE 7.0 05/13/2015 1931   GLUCOSEU 500 (A) 05/13/2015 1931   HGBUR TRACE (A) 05/13/2015 1931   BILIRUBINUR NEGATIVE 05/13/2015 1931   KETONESUR NEGATIVE 05/13/2015 1931   PROTEINUR >300 (A) 05/13/2015 1931   UROBILINOGEN 0.2 06/19/2009 1333   NITRITE NEGATIVE 05/13/2015 1931   LEUKOCYTESUR NEGATIVE 05/13/2015 1931   Sepsis Labs: @LABRCNTIP (procalcitonin:4,lacticidven:4) )No results found for this or any previous visit (from the past 240 hour(s)).   Radiological Exams on Admission: Ct Head Code Stroke W/o Cm  Result Date: 05/20/2016 CLINICAL DATA:  Code stroke. Left-sided facial droop and slurred speech. EXAM: CT HEAD WITHOUT CONTRAST TECHNIQUE: Contiguous axial images were obtained from the base of the skull through the vertex without intravenous contrast. COMPARISON:  05/13/2015 FINDINGS: Brain: No evidence of acute infarction, hemorrhage, hydrocephalus, extra-axial collection or mass lesion/mass effect. There is a moderate area of remote infarct in the high right frontal cortex and white matter. Smaller right frontal/parietal infarct. Vascular: Extensive calcified atherosclerotic plaque. No hyperdense vessel. There is asymmetric sulcal calcification on the right, stable from prior and associated with remote infarcts- suspected remote embolic calcified plaque. Skull: No acute or aggressive finding Sinuses/Orbits: No acute finding Other: Text page with results sent on 05/20/2016 at 8:32 pm to Dr. Cheral Marker. ASPECTS [facility] Stroke Program Early CT Score) Not scored in the setting of remote right frontal infarct. IMPRESSION: 1. No acute finding. 2. Remote right frontal infarcts. Associated sulcal calcification suggesting remote calcified plaque embolus. Electronically Signed   By: Monte Fantasia M.D.   On: 05/20/2016  20:36    EKG: Independently reviewed. Sinus rhythm, non-specific repolarization abnormality without significant change from prior.   Assessment/Plan  1. Dysarthria, dysphagia  - Pt woke from a nap at ~17:45 on 05/20/16 with dysarthria, noted by family to have left facial droop; he then had some trouble eating with dysphagia and tongue-biting  - Facial droop no longer evident by time of admission  - Head CT negative for acute intracranial abnormality, notable for old right frontal strokes  - Neurology is consulting and much appreciated  - Pt with hx of cryptogenic CVA s/p ILR - tPA not given due to low stroke score   - Monitor on telemetry, permit HTN to  210/110 in acute phase, follow frequent neuro checks, maintain euvolemia, euglycemia, and normothermia   - Check MRI/MRA, carotid US, echo, A1c, fasting lipids - PT, OT, and SLP evals  - Continue ASA 325 and high-intensity statin   2. Hypertension with hypertensive urgency  - BP 210/95 on arrival, possible secondary to ischemic CVA - Managed at home with Toprol and hydralazine, currently held  - Labetalol IVP's prn SBP >210, or DBP >110; resume home antihypertensives and bring BP back to goal in coming days   3. AKI superimposed on CKD stage IV  - SCr is 4.31 on admission, up from apparent baseline of ~3.2  - Appears dry on admission; plan to hold Lasix and provide a gentle IVF hydration  - Check urine studies, repeat chem panel in am    4. Chronic diastolic CHF  - Appears dry on admission  - TTE (05/15/15) with EF 35-46%, grade 1 diastolic dysfunction, moderate LVH  - Managed at home with Lasix 40 mg BID and metoprolol; these are held currently  - Follow daily wts and strict I/O's, daily chem panel    5. Insulin-dependent DM  - A1c was 8.8% in March 2017  - Managed at home with Humalog pump, 46 units basal, and 36 units bolus per day  - Check CBG q4h until appropriate for diet  - Start Lantus 35 units with moderate-intensity  correctional    6. Hypothyroidism  - Appears stable, will continue Synthroid    DVT prophylaxis: sq heparin  Code Status: Full  Family Communication: Daughter updated at bedside Disposition Plan: Observe on telemetry Consults called: Neurology Admission status: Observation    Vianne Bulls, MD [facility] Pager [phone]  If 7PM-7AM, please contact night-coverage [email or web address] Password [facility]  05/20/2016, 11:26 PM

## 2016-05-20 NOTE — ED Triage Notes (Signed)
Pt presents to ED with slurred speech and left sided facial droop that he states started about 2.5 hours ago, which would be 1745. Pt and family reports his speech does sound slurred. Pt drove to daughters house prior to coming to ED. Dr. Eulis Foster to see patient for possible code stroke.

## 2016-05-20 NOTE — ED Provider Notes (Signed)
[facility] DEPT Provider Note   CSN: [id number] Arrival date & time: 05/20/16  2009     History   Chief Complaint Chief Complaint  Patient presents with  . Slurred Speech    HPI Robert [name] is a 81 y.o. male.  Patient was resting today, not asleep, at 5:45 PM, when he noticed his speech was slurred while he was talking to his wife.  After that he drove to his daughter's home, who was concerned that he had slurred speech and was drooping on the left side of his face.  She brought him here, by private vehicle, for evaluation.  He had a stroke one year ago that was apparently complicated by significant aphasia.  Today, his daughter wonders if he is having a aphasia, but is not clear about which symptom she is referring to.  The patient is alert and conversant, and does not feel he has a aphasia at this time.  The patient denies fever, chills, chest pain, syncope, back pain or headache.  He is taking his usual medications.  There are no other known modifying factors.  HPI  Past Medical History:  Diagnosis Date  . Allergy    year round  . Cataract   . Diabetes mellitus   . GERD (gastroesophageal reflux disease)   . Hearing deficit   . Hyperlipidemia   . Hypertension   . Myocardial infarction 2011  . Stroke (cerebrum) ([place])   . Thyroid disease    hypothyroid    Patient Active Problem List   Diagnosis Date Noted  . Chronic kidney disease (CKD), stage IV (severe) (Robert [name]) 03/20/2016  . Acute right MCA stroke ([place])   . CVA (cerebral infarction) 05/13/2015  . Acute encephalopathy 05/13/2015  . Hypertensive urgency 05/13/2015  . Hyperlipidemia 05/13/2015  . Diabetes mellitus (Robert [name]) 08/18/2009  . Anemia 08/18/2009  . HYPERTENSION 08/18/2009  . ALLERGIC RHINITIS, SEASONAL 08/18/2009  . PARAPHARYNGEAL ABSCESS 08/18/2009  . GERD 08/18/2009  . HIATAL HERNIA 08/18/2009  . DIVERTICULOSIS, COLON 08/18/2009  . 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  ARTERIOVENOUS MALFORMATION, COLON 08/18/2009  . DYSPHAGIA 08/18/2009  . COLONIC POLYPS, HX OF 08/18/2009  . RENAL CALCULUS, HX OF 08/18/2009  . ESOPHAGEAL STRICTURE 06/21/2009    Past Surgical History:  Procedure Laterality Date  . CORONARY ANGIOPLASTY WITH STENT PLACEMENT  07/2009   3 stents  . EP IMPLANTABLE DEVICE N/A 05/16/2015   Procedure: Loop Recorder Insertion;  Surgeon: Thompson Grayer, MD;  Location: [facility];  Service: Cardiovascular;  Laterality: N/A;  . insulin pump needs to be removed for mri    . stents    . TEE WITHOUT CARDIOVERSION N/A 05/16/2015   Procedure: TRANSESOPHAGEAL ECHOCARDIOGRAM (TEE);  Surgeon: Thayer Headings, MD;  Location: [facility];  Service: Cardiovascular;  Laterality: N/A;       Home Medications    Prior to Admission medications   Medication Sig Start Date End Date Taking? Authorizing Provider  albuterol (PROVENTIL HFA;VENTOLIN HFA) 108 (90 Base) MCG/ACT inhaler Inhale 2 puffs into the lungs every 6 (six) hours as needed for wheezing or shortness of breath.   Yes Historical Provider, MD  aspirin 325 MG EC tablet Take 325 mg by mouth daily.   Yes Historical Provider, MD  calcitRIOL (ROCALTROL) 0.25 MCG capsule Take 0.25 mcg by mouth every Monday, Wednesday, and Friday.   Yes Historical Provider, MD  furosemide (LASIX) 40 MG tablet Take 40 mg by mouth 2 (two) times daily.   Yes Historical  Provider, MD  hydrALAZINE (APRESOLINE) 100 MG tablet Take 100 mg by mouth 3 (three) times daily.  06/13/15  Yes Historical Provider, MD  Insulin Human (INSULIN PUMP) SOLN Inject 1 each into the skin. Take 40 units Basal and 36 units bolus, humalog is in the pump   Yes Historical Provider, MD  levothyroxine (SYNTHROID, LEVOTHROID) 50 MCG tablet Take 50 mcg by mouth daily before breakfast.  03/03/15  Yes Historical Provider, MD  metoprolol succinate (TOPROL-XL) 50 MG 24 hr tablet Take 50 mg by mouth 2 (two) times daily.  07/12/15  Yes Historical Provider, MD    Multiple Vitamins-Minerals (MULTIVITAMIN WITH MINERALS) tablet Take 1 tablet by mouth daily.   Yes Historical Provider, MD  omeprazole (PRILOSEC) 20 MG capsule Take 1 capsule by mouth Daily. 04/20/11  Yes Historical Provider, MD  simvastatin (ZOCOR) 40 MG tablet Take 1 tablet (40 mg total) by mouth daily at 6 PM. 05/17/15  Yes Velvet Bathe, MD    Family History Family History  Problem Relation Age of Onset  . Dementia Maternal Grandfather     Social History Social History  Substance Use Topics  . Smoking status: Former Research scientist (life sciences)  . Smokeless tobacco: Never Used  . Alcohol use 1.2 oz/week    2 Glasses of wine per week     Allergies   Lisinopril   Review of Systems Review of Systems  All other systems reviewed and are negative.    Physical Exam Updated Vital Signs BP (!) 207/80   Pulse 69   Temp 97.8 F (36.6 C) (Oral)   Resp (!) 24   SpO2 95%   Physical Exam  Constitutional: He appears well-developed. No distress.  Elderly, frail  HENT:  Head: Normocephalic and atraumatic.  Eyes: Conjunctivae are normal.  Neck: Neck supple.  Cardiovascular: Normal rate and regular rhythm.   No murmur heard. Pulmonary/Chest: Effort normal and breath sounds normal. No respiratory distress.  Abdominal: Soft. There is no tenderness.  Musculoskeletal: He exhibits no edema.  Neurological: He is alert.  Possible mild dysarthria.  Mild left facial droop.  No change in facial sensation.  Able to walk with fairly normal gait.  No ataxia.  Skin: Skin is warm and dry.  Psychiatric: He has a normal mood and affect.  Nursing note and vitals reviewed.    ED Treatments / Results  Labs (all labs ordered are listed, but only abnormal results are displayed) Labs Reviewed  CBC - Abnormal; Notable for the following:       Result Value   Hemoglobin 12.3 (*)    HCT 37.3 (*)    Platelets 145 (*)    All other components within normal limits  COMPREHENSIVE METABOLIC PANEL - Abnormal; Notable  for the following:    CO2 21 (*)    Glucose, Bld 262 (*)    BUN 53 (*)    Creatinine, Ser 4.31 (*)    Calcium 8.5 (*)    Total Protein 5.7 (*)    Albumin 3.0 (*)    GFR calc non Af Amer 12 (*)    GFR calc Af Amer 14 (*)    All other components within normal limits  CBG MONITORING, ED - Abnormal; Notable for the following:    Glucose-Capillary 259 (*)    All other components within normal limits  I-STAT CHEM 8, ED - Abnormal; Notable for the following:    BUN 52 (*)    Creatinine, Ser 4.50 (*)    Glucose, Bld 264 (*)  Calcium, Ion 1.11 (*)    Hemoglobin 11.9 (*)    HCT 35.0 (*)    All other components within normal limits  PROTIME-INR  APTT  DIFFERENTIAL  I-STAT TROPOININ, ED    EKG  EKG Interpretation  Date/Time:  Monday May 20 2016 21:02:54 EDT Ventricular Rate:  71 PR Interval:    QRS Duration: 86 QT Interval:  402 QTC Calculation: 437 R Axis:   71 Text Interpretation:  Sinus rhythm Anterior infarct, old Borderline repolarization abnormality since last tracing no significant change Confirmed by Eulis Foster  MD, Fredonia Casalino [phone]) on 05/20/2016 9:56:37 PM       Radiology Ct Head Code Stroke W/o Cm  Result Date: 05/20/2016 CLINICAL DATA:  Code stroke. Left-sided facial droop and slurred speech. EXAM: CT HEAD WITHOUT CONTRAST TECHNIQUE: Contiguous axial images were obtained from the base of the skull through the vertex without intravenous contrast. COMPARISON:  05/13/2015 FINDINGS: Brain: No evidence of acute infarction, hemorrhage, hydrocephalus, extra-axial collection or mass lesion/mass effect. There is a moderate area of remote infarct in the high right frontal cortex and white matter. Smaller right frontal/parietal infarct. Vascular: Extensive calcified atherosclerotic plaque. No hyperdense vessel. There is asymmetric sulcal calcification on the right, stable from prior and associated with remote infarcts- suspected remote embolic calcified plaque. Skull: No acute or  aggressive finding Sinuses/Orbits: No acute finding Other: Text page with results sent on 05/20/2016 at 8:32 pm to Dr. Cheral Marker. ASPECTS [facility] Stroke Program Early CT Score) Not scored in the setting of remote right frontal infarct. IMPRESSION: 1. No acute finding. 2. Remote right frontal infarcts. Associated sulcal calcification suggesting remote calcified plaque embolus. Electronically Signed   By: Monte Fantasia M.D.   On: 05/20/2016 20:36    Procedures Procedures (including critical care time)  Medications Ordered in ED Medications - No data to display   Initial Impression / Assessment and Plan / ED Course  I have reviewed the triage vital signs and the nursing notes.  Pertinent labs & imaging results that were available during my care of the patient were reviewed by me and considered in my medical decision making (see chart for details).  Clinical Course as of May 21 2247  Mon May 20, 2016  2017 Called code stroke  [EW]  2246 High, near baseline BUN: (!) 52 [EW]  2246 Heart, near baseline Creatinine: (!) 4.50 [EW]  2246 Low Calcium Ionized: (!) 1.11 [EW]  2246 High Glucose: (!) 262 [EW]  2247 Low Total Protein: (!) 5.7 [EW]  2247 No acute changes CT Head Code Stroke W/O CM [EW]    Clinical Course User Index [EW] Daleen Bo, MD    Medications - No data to display  Patient Vitals for the past 24 hrs:  BP Temp Temp src Pulse Resp SpO2  05/20/16 2240 (!) 207/80 97.8 F (36.6 C) Oral 69 (!) 24 95 %  05/20/16 2130 (!) 195/96 - - 71 20 99 %  05/20/16 2115 (!) 184/78 - - 63 (!) 22 97 %  05/20/16 2103 (!) 205/76 - - 71 (!) 22 98 %  05/20/16 2040 - - - 72 - 99 %  05/20/16 2039 (!) 210/95 - - - - -  05/20/16 2017 - 97.7 F (36.5 C) Oral 76 17 98 %    10:48 PM Reevaluation with update and discussion. After initial assessment and treatment, an updated evaluation reveals no change in clinical status.  Findings discussed with patient and daughter, all questions answered.  Elianna Windom L  10:48 PM-Consult complete with hospitalist. Patient case explained and discussed.  He agrees to admit patient for further evaluation and treatment. Call ended at 23:02   Final Clinical Impressions(s) / ED Diagnoses   Final diagnoses:  Slurred speech  Renal insufficiency    Patient presented for evaluation of slurred speech and left facial weakness.  He was evaluated by rapid response, and stroke neurology who canceled the code stroke which was called by me.  Screening labs are near baseline with moderate elevation of BUN and creatinine.  Patient will be evaluated further and admitted for management and treatment.  Nursing Notes Reviewed/ Care Coordinated Applicable Imaging Reviewed Interpretation of Laboratory Data incorporated into ED treatment  Plan: Admit  New Prescriptions New Prescriptions   No medications on file     Daleen Bo, MD 05/20/16 [phone]

## 2016-05-20 NOTE — ED Notes (Signed)
Neuro to cancel code stroke

## 2016-05-21 ENCOUNTER — Observation Stay (HOSPITAL_COMMUNITY): Payer: Medicare Other

## 2016-05-21 ENCOUNTER — Observation Stay (HOSPITAL_BASED_OUTPATIENT_CLINIC_OR_DEPARTMENT_OTHER): Payer: Medicare Other

## 2016-05-21 ENCOUNTER — Encounter (HOSPITAL_COMMUNITY): Payer: Medicare Other

## 2016-05-21 DIAGNOSIS — K219 Gastro-esophageal reflux disease without esophagitis: Secondary | ICD-10-CM | POA: Diagnosis present

## 2016-05-21 DIAGNOSIS — Z794 Long term (current) use of insulin: Secondary | ICD-10-CM | POA: Diagnosis not present

## 2016-05-21 DIAGNOSIS — Z8673 Personal history of transient ischemic attack (TIA), and cerebral infarction without residual deficits: Secondary | ICD-10-CM | POA: Diagnosis not present

## 2016-05-21 DIAGNOSIS — N184 Chronic kidney disease, stage 4 (severe): Secondary | ICD-10-CM | POA: Diagnosis not present

## 2016-05-21 DIAGNOSIS — I639 Cerebral infarction, unspecified: Secondary | ICD-10-CM

## 2016-05-21 DIAGNOSIS — I69391 Dysphagia following cerebral infarction: Secondary | ICD-10-CM | POA: Diagnosis not present

## 2016-05-21 DIAGNOSIS — E1122 Type 2 diabetes mellitus with diabetic chronic kidney disease: Secondary | ICD-10-CM | POA: Diagnosis present

## 2016-05-21 DIAGNOSIS — R4781 Slurred speech: Secondary | ICD-10-CM | POA: Diagnosis present

## 2016-05-21 DIAGNOSIS — I6521 Occlusion and stenosis of right carotid artery: Secondary | ICD-10-CM | POA: Diagnosis not present

## 2016-05-21 DIAGNOSIS — I63231 Cerebral infarction due to unspecified occlusion or stenosis of right carotid arteries: Principal | ICD-10-CM

## 2016-05-21 DIAGNOSIS — E785 Hyperlipidemia, unspecified: Secondary | ICD-10-CM | POA: Diagnosis present

## 2016-05-21 DIAGNOSIS — E7800 Pure hypercholesterolemia, unspecified: Secondary | ICD-10-CM | POA: Diagnosis present

## 2016-05-21 DIAGNOSIS — E119 Type 2 diabetes mellitus without complications: Secondary | ICD-10-CM | POA: Diagnosis not present

## 2016-05-21 DIAGNOSIS — I6522 Occlusion and stenosis of left carotid artery: Secondary | ICD-10-CM

## 2016-05-21 DIAGNOSIS — I130 Hypertensive heart and chronic kidney disease with heart failure and stage 1 through stage 4 chronic kidney disease, or unspecified chronic kidney disease: Secondary | ICD-10-CM | POA: Diagnosis present

## 2016-05-21 DIAGNOSIS — R471 Dysarthria and anarthria: Secondary | ICD-10-CM

## 2016-05-21 DIAGNOSIS — I10 Essential (primary) hypertension: Secondary | ICD-10-CM | POA: Diagnosis not present

## 2016-05-21 DIAGNOSIS — I160 Hypertensive urgency: Secondary | ICD-10-CM | POA: Diagnosis present

## 2016-05-21 DIAGNOSIS — Z7982 Long term (current) use of aspirin: Secondary | ICD-10-CM | POA: Diagnosis not present

## 2016-05-21 DIAGNOSIS — G8194 Hemiplegia, unspecified affecting left nondominant side: Secondary | ICD-10-CM | POA: Diagnosis not present

## 2016-05-21 DIAGNOSIS — I6789 Other cerebrovascular disease: Secondary | ICD-10-CM

## 2016-05-21 DIAGNOSIS — I5032 Chronic diastolic (congestive) heart failure: Secondary | ICD-10-CM | POA: Diagnosis not present

## 2016-05-21 DIAGNOSIS — E669 Obesity, unspecified: Secondary | ICD-10-CM | POA: Diagnosis present

## 2016-05-21 DIAGNOSIS — N179 Acute kidney failure, unspecified: Secondary | ICD-10-CM | POA: Diagnosis not present

## 2016-05-21 DIAGNOSIS — I252 Old myocardial infarction: Secondary | ICD-10-CM | POA: Diagnosis not present

## 2016-05-21 DIAGNOSIS — R1310 Dysphagia, unspecified: Secondary | ICD-10-CM | POA: Diagnosis present

## 2016-05-21 DIAGNOSIS — D649 Anemia, unspecified: Secondary | ICD-10-CM | POA: Diagnosis present

## 2016-05-21 DIAGNOSIS — Z87891 Personal history of nicotine dependence: Secondary | ICD-10-CM | POA: Diagnosis not present

## 2016-05-21 DIAGNOSIS — Z955 Presence of coronary angioplasty implant and graft: Secondary | ICD-10-CM | POA: Diagnosis not present

## 2016-05-21 DIAGNOSIS — N2581 Secondary hyperparathyroidism of renal origin: Secondary | ICD-10-CM | POA: Diagnosis present

## 2016-05-21 DIAGNOSIS — I69354 Hemiplegia and hemiparesis following cerebral infarction affecting left non-dominant side: Secondary | ICD-10-CM | POA: Diagnosis not present

## 2016-05-21 DIAGNOSIS — R29810 Facial weakness: Secondary | ICD-10-CM | POA: Diagnosis present

## 2016-05-21 DIAGNOSIS — E039 Hypothyroidism, unspecified: Secondary | ICD-10-CM | POA: Diagnosis present

## 2016-05-21 DIAGNOSIS — I2510 Atherosclerotic heart disease of native coronary artery without angina pectoris: Secondary | ICD-10-CM | POA: Diagnosis present

## 2016-05-21 LAB — ECHOCARDIOGRAM COMPLETE
EWDT: 275 ms
FS: 31 % (ref 28–44)
Height: 68 in
IVS/LV PW RATIO, ED: 1.33
LA ID, A-P, ES: 38 mm
LA diam end sys: 38 mm
LA diam index: 2 cm/m2
LA vol A4C: 51.5 ml
LA vol index: 28.7 mL/m2
LA vol: 54.5 mL
LV TDI E'LATERAL: 6.2
LVELAT: 6.2 cm/s
LVOT VTI: 26 cm
LVOT area: 3.14 cm2
LVOT peak grad rest: 4 mmHg
LVOTD: 20 mm
LVOTPV: 94.2 cm/s
LVOTSV: 82 mL
Lateral S' vel: 11.7 cm/s
MV Dec: 275
MV pk E vel: 0.6 m/s
PW: 12 mm — AB (ref 0.6–1.1)
RV TAPSE: 19.4 mm
TDI e' medial: 4.24
Weight: 3140.8 oz

## 2016-05-21 LAB — LIPID PANEL
Cholesterol: 143 mg/dL (ref 0–200)
HDL: 22 mg/dL — AB (ref >40)
LDL CALC: UNDETERMINED mg/dL (ref 0–99)
TRIGLYCERIDES: 749 mg/dL — AB (ref <150)
Total CHOL/HDL Ratio: 6.5 RATIO
VLDL: UNDETERMINED mg/dL (ref 0–40)

## 2016-05-21 LAB — GLUCOSE, CAPILLARY
GLUCOSE-CAPILLARY: 260 mg/dL — AB (ref 65–99)
GLUCOSE-CAPILLARY: 272 mg/dL — AB (ref 65–99)
GLUCOSE-CAPILLARY: 176 mg/dL — AB (ref 65–99)
GLUCOSE-CAPILLARY: 160 mg/dL — AB (ref 65–99)
Glucose-Capillary: 109 mg/dL — ABNORMAL HIGH (ref 65–99)
Glucose-Capillary: 162 mg/dL — ABNORMAL HIGH (ref 65–99)

## 2016-05-21 LAB — URINALYSIS, ROUTINE W REFLEX MICROSCOPIC
Bacteria, UA: NONE SEEN
Bilirubin Urine: NEGATIVE
Hgb urine dipstick: NEGATIVE
Ketones, ur: NEGATIVE mg/dL
Leukocytes, UA: NEGATIVE
Nitrite: NEGATIVE
SPECIFIC GRAVITY, URINE: 1.012 (ref 1.005–1.030)
SQUAMOUS EPITHELIAL / LPF: NONE SEEN
pH: 6 (ref 5.0–8.0)

## 2016-05-21 LAB — RAPID URINE DRUG SCREEN, HOSP PERFORMED
Amphetamines: NOT DETECTED
BARBITURATES: NOT DETECTED
BENZODIAZEPINES: NOT DETECTED
COCAINE: NOT DETECTED
Opiates: NOT DETECTED
TETRAHYDROCANNABINOL: NOT DETECTED

## 2016-05-21 LAB — SODIUM, URINE, RANDOM: Sodium, Ur: 90 mmol/L

## 2016-05-21 LAB — CREATININE, URINE, RANDOM: Creatinine, Urine: 80.36 mg/dL

## 2016-05-21 MED ORDER — INSULIN ASPART 100 UNIT/ML ~~LOC~~ SOLN: 0.0000 [IU] | Freq: Every day | SUBCUTANEOUS | Status: DC

## 2016-05-21 MED ORDER — INSULIN ASPART 100 UNIT/ML ~~LOC~~ SOLN
0.0000 [IU] | Freq: Three times a day (TID) | SUBCUTANEOUS | Status: DC
  Administered 2016-05-21 – 2016-05-22 (×4): 3 [IU] via SUBCUTANEOUS
  Administered 2016-05-23 (×2): 2 [IU] via SUBCUTANEOUS
  Administered 2016-05-23: 5 [IU] via SUBCUTANEOUS
  Administered 2016-05-24: 3 [IU] via SUBCUTANEOUS

## 2016-05-21 MED ORDER — EZETIMIBE 10 MG PO TABS
10.0000 mg | ORAL_TABLET | Freq: Every day | ORAL | Status: DC
  Administered 2016-05-21 – 2016-05-24 (×4): 10 mg via ORAL
  Filled 2016-05-21 (×4): qty 1

## 2016-05-21 NOTE — Consult Note (Addendum)
Physical Medicine and Rehabilitation Consult    Reason for Consult: LUE weakness, right visual field deficits and poor safety.  Referring Physician: Dr. Eliseo Squires   HPI: Robert [name] is a 81 y.o. male with history of T2DM, GERD, HTN, CKD-stage IV, HOH,  embolic stroke 02/6107 who was admitted on 05/20/16 with left facial droop, hoarsness and slurred speech. MRI/ MRA brain done revealing several subcentimeter foci of acute/early subacute infarct in right posterior frontal lobe, precentral gyrus and right temporal lobe. Carotid dopplers with 60-79% R-ICA stenosis--progress since 04/2015.  BLE dopplers without DVT. 2 D echo with EF 55-62% and mild LVH with mild focal basal hypertrophy of septum and severely calcified MV. Dr. Erlinda Hong recommended DAPt for embolic stroke due to R-ICA stenosis. Dr. Trula Slade consulted for input and recommended CT of neck for workup prior to decision regarding stenting v/s endarterectomy. CT soft tissue neck showed severe calcific atherosclerosis of ICA --Right > Left.  Nephrology consulted for input on acute on chronic renal failure and changes felt to be due to progression of disease. Patient with decrease awareness of left side, right visual field deficits, slurred speech, poor safety affecting mobility and ability to carry out ADL tasks. CIR recommended for follow up therapy.   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 He reports that his children/grandchildren will provide supervision after discharge and he wants to go home today.   Spoke with Dr Trula Slade who would recommend CEA after rehab   Review of Systems  HENT: Positive for hearing loss.   Eyes: Negative for blurred vision and double vision.  Respiratory: Negative for cough, shortness of breath and stridor.   Cardiovascular: Negative for chest pain and palpitations.  Gastrointestinal: Negative for abdominal pain.  Genitourinary: Negative for dysuria.  Musculoskeletal: Negative for back pain, joint pain and  myalgias.  Skin: Negative for rash.  Neurological: Negative for dizziness and headaches.  Psychiatric/Behavioral: The patient does not have insomnia.       Past Medical History:  Diagnosis Date  . Allergy    year round  . Cataract   . Diabetes mellitus   . GERD (gastroesophageal reflux disease)   . Hearing deficit   . Hyperlipidemia   . Hypertension   . Myocardial infarction 2011  . Stroke (cerebrum) ([place])   . Thyroid disease    hypothyroid    Past Surgical History:  Procedure Laterality Date  . CORONARY ANGIOPLASTY WITH STENT PLACEMENT  07/2009   3 stents  . EP IMPLANTABLE DEVICE N/A 05/16/2015   Procedure: Loop Recorder Insertion;  Surgeon: Thompson Grayer, MD;  Location: [facility];  Service: Cardiovascular;  Laterality: N/A;  . insulin pump needs to be removed for mri    . stents    . TEE WITHOUT CARDIOVERSION N/A 05/16/2015   Procedure: TRANSESOPHAGEAL ECHOCARDIOGRAM (TEE);  Surgeon: Thayer Headings, MD;  Location: [facility] ENDOSCOPY;  Service: Cardiovascular;  Laterality: N/A;    Family History  Problem Relation Age of Onset  . Dementia Maternal Grandfather     Social History:  Married. Retired Art gallery manager.  Is caregiver for is wife (has severe dementia and needs supervision). Family lives nearby and check in.  Has assistance with cleaning. He reports that he has quit smoking. He has never used smokeless tobacco. He reports that he drinks about 1.2 oz of alcohol per week . He reports that he does not use drugs.    Allergies  Allergen Reactions  . Lisinopril Other (See Comments)    "  made me lose my voice"    Medications Prior to Admission  Medication Sig Dispense Refill  . albuterol (PROVENTIL HFA;VENTOLIN HFA) 108 (90 Base) MCG/ACT inhaler Inhale 2 puffs into the lungs every 6 (six) hours as needed for wheezing or shortness of breath.    [name] aspirin 325 MG EC tablet Take 325 mg by mouth daily.    . calcitRIOL (ROCALTROL) 0.25 MCG capsule Take 0.25 mcg by  mouth every Monday, Wednesday, and Friday.    . furosemide (LASIX) 40 MG tablet Take 40 mg by mouth 2 (two) times daily.    . hydrALAZINE (APRESOLINE) 100 MG tablet Take 100 mg by mouth 3 (three) times daily.     . Insulin Human (INSULIN PUMP) SOLN Inject 1 each into the skin. Take 40 units Basal and 36 units bolus, humalog is in the pump    . levothyroxine (SYNTHROID, LEVOTHROID) 50 MCG tablet Take 50 mcg by mouth daily before breakfast.     . metoprolol succinate (TOPROL-XL) 50 MG 24 hr tablet Take 50 mg by mouth 2 (two) times daily.     . Multiple Vitamins-Minerals (MULTIVITAMIN WITH MINERALS) tablet Take 1 tablet by mouth daily.    [name] omeprazole (PRILOSEC) 20 MG capsule Take 1 capsule by mouth Daily.    . simvastatin (ZOCOR) 40 MG tablet Take 1 tablet (40 mg total) by mouth daily at 6 PM. 30 tablet 0    Home: Home Living Family/patient expects to be discharged to:: Private residence Living Arrangements: Spouse/significant other (wife requires physical assistance and pt is caregiver) Available Help at Discharge: Family Type of Home: House Home Access: Stairs to enter [employer] of Steps: 4 from garage with rail, 2-3 from back no rail Home Layout: Multi-level, Able to live on main level with bedroom/bathroom Bathroom Shower/Tub: Multimedia programmer: Standard Home Equipment: Shower seat - built in, Bedside commode, Grab bars - tub/shower  Lives With: Other (Comment) (wife has dementia and recent wound requiring significant treatment)  Functional History: Prior Function Level of Independence: Independent Comments: was helping take care of wife, children assist as well, pt drives Functional Status:  Mobility: Bed Mobility Overal bed mobility: Needs Assistance Bed Mobility: Supine to Sit, Sit to Supine Supine to sit: Min assist Sit to supine: Min assist General bed mobility comments: Min assist for trunk. Transfers Overall transfer level: Needs  assistance Equipment used: Rolling walker (2 wheeled) Transfers: Sit to/from Stand Sit to Stand: Min assist General transfer comment: for balance, increased time to stand, heavy R UE support Ambulation/Gait Ambulation/Gait assistance: Supervision, Min assist Ambulation Distance (Feet): 160 Feet Assistive device: Rolling walker (2 wheeled), None Gait Pattern/deviations: Step-through pattern, Decreased stride length, Wide base of support, Shuffle General Gait Details: occasionally drags L foot on floor, initially utilized walker with S, then without device as pt not holding on consistently with L UE S to minguard for safety as occasional dragging L foot and running into door facing on L  Stairs: Yes Stairs assistance: Min assist Stair Management: Step to pattern, One rail Left, One rail Right Number of Stairs: 2 General stair comments:  self selected pattern, leads up with R, stepping down with R first and slowly lowering with heavy UE support on railing and min A to prevent LOB as twisting body    ADL: ADL Overall ADL's : Needs assistance/impaired Eating/Feeding: Minimal assistance, Sitting Eating/Feeding Details (indicate cue type and reason): Min assist to manage utensils and open containers. Grooming: Minimal assistance, Sitting Grooming Details (indicate  cue type and reason): Min assist for B hand tasks. Upper Body Bathing: Minimal assistance, Sitting Lower Body Bathing: Minimal assistance, Sit to/from stand Upper Body Dressing : Minimal assistance, Sitting Upper Body Dressing Details (indicate cue type and reason): Min assist for bimanual tasks. Lower Body Dressing: Minimal assistance, Sit to/from stand Toilet Transfer: Min guard, RW, BSC Toileting- Clothing Manipulation and Hygiene: Min guard, Sit to/from stand Functional mobility during ADLs: Min guard, Rolling walker General ADL Comments: Pt with decreased awareness of L sided deficits. He demonstrates significant difficulty  with fine motor coordination but he does not acknowledge difficulties when asked.   Cognition: Cognition Overall Cognitive Status: History of cognitive impairments - at baseline Arousal/Alertness: Awake/alert Orientation Level: Oriented X4 Attention: Sustained Sustained Attention: Appears intact Memory: Impaired Memory Impairment: Retrieval deficit (0/5 recalled, 3/5 category cue, 2/5 multiple choice) Awareness: Appears intact Problem Solving: Appears intact Safety/Judgment: Appears intact Cognition Arousal/Alertness: Awake/alert Behavior During Therapy: WFL for tasks assessed/performed Overall Cognitive Status: History of cognitive impairments - at baseline General Comments: Pt demonstrates slow processing and decreased short-term memory at baseline. Family assists with medication management. Blood pressure (!) 176/67, pulse 79, temperature 97.9 F (36.6 C), temperature source Oral, resp. rate 18, height 5\' 8"  (1.727 m), weight 91.3 kg (201 lb 4.8 oz), SpO2 96 %.    Physical Exam  Nursing note and vitals reviewed. Constitutional: He is oriented to person, place, and time. He appears well-developed and well-nourished.  Obese male. NAD  HENT:  Head: Normocephalic and atraumatic.  Mouth/Throat: Oropharynx is clear and moist.  Eyes: Conjunctivae are normal. Pupils are equal, round, and reactive to light.  Neck: Normal range of motion. Neck supple.  Cardiovascular: Normal rate and regular rhythm.   Respiratory: Effort normal and breath sounds normal. No stridor.  GI: Soft. Bowel sounds are normal. He exhibits no distension. There is no tenderness.  Musculoskeletal: He exhibits no edema or tenderness.  Neurological: He is alert and oriented to person, place, and time.  Left facial weakness with mild dysarthria. Able to follow simple one and two step motor commands without difficulty. LUE weakness noted.   Skin: Skin is dry. No rash noted. No erythema.  5/5 right deltoid, biceps,  triceps, grip, hip flexion, knee extension, ankle dorsiflexion. 4 minus. Left deltoid, bicep, tricep, grip, hip flexion, knee extension, ankle dorsiflexion, plantar flexion. Sensation intact to light touch bilateral upper and lower limbs. Orientation is to person, place and time  Results for orders placed or performed during the hospital encounter of 05/20/16 (from the past 24 hour(s))  Glucose, capillary     Status: Abnormal   Collection Time: 05/21/16 11:47 AM  Result Value Ref Range   Glucose-Capillary 162 (H) 65 - 99 mg/dL   Comment 1 Notify RN    Comment 2 Document in Chart   Glucose, capillary     Status: Abnormal   Collection Time: 05/21/16  4:45 PM  Result Value Ref Range   Glucose-Capillary 176 (H) 65 - 99 mg/dL  Glucose, capillary     Status: Abnormal   Collection Time: 05/21/16  8:53 PM  Result Value Ref Range   Glucose-Capillary 160 (H) 65 - 99 mg/dL  CBC     Status: Abnormal   Collection Time: 05/22/16  5:09 AM  Result Value Ref Range   WBC 6.6 4.0 - 10.5 K/uL   RBC 3.91 (L) 4.22 - 5.81 MIL/uL   Hemoglobin 10.8 (L) 13.0 - 17.0 g/dL   HCT 34.1 (L) 39.0 - 52.0 %  MCV 87.2 78.0 - 100.0 fL   MCH 27.6 26.0 - 34.0 pg   MCHC 31.7 30.0 - 36.0 g/dL   RDW 15.0 11.5 - 15.5 %   Platelets 150 150 - 400 K/uL  Renal function panel     Status: Abnormal   Collection Time: 05/22/16  5:09 AM  Result Value Ref Range   Sodium 141 135 - 145 mmol/L   Potassium 4.1 3.5 - 5.1 mmol/L   Chloride 110 101 - 111 mmol/L   CO2 22 22 - 32 mmol/L   Glucose, Bld 119 (H) 65 - 99 mg/dL   BUN 53 (H) 6 - 20 mg/dL   Creatinine, Ser 3.98 (H) 0.61 - 1.24 mg/dL   Calcium 8.2 (L) 8.9 - 10.3 mg/dL   Phosphorus 4.2 2.5 - 4.6 mg/dL   Albumin 2.6 (L) 3.5 - 5.0 g/dL   GFR calc non Af Amer 13 (L) >60 mL/min   GFR calc Af Amer 15 (L) >60 mL/min   Anion gap 9 5 - 15  Glucose, capillary     Status: Abnormal   Collection Time: 05/22/16  6:16 AM  Result Value Ref Range   Glucose-Capillary 105 (H) 65 - 99  mg/dL   Comment 1 Notify RN    Comment 2 Document in Chart    Dg Chest 2 View  Result Date: 05/21/2016 CLINICAL DATA:  Acute onset of shortness of breath and dysarthria. Initial encounter. EXAM: CHEST  2 VIEW COMPARISON:  Chest radiograph performed 05/13/2015 FINDINGS: The lungs are well-aerated and clear. There is no evidence of focal opacification, pleural effusion or pneumothorax. The heart is normal in size; the mediastinal contour is within normal limits. A loop recorder is noted. No acute osseous abnormalities are seen. IMPRESSION: No acute cardiopulmonary process seen. Electronically Signed   By: Garald Balding M.D.   On: 05/21/2016 00:44   Ct Soft Tissue Neck Wo Contrast  Result Date: 05/21/2016 CLINICAL DATA:  Stroke, assess for carotid plaque. History of RIGHT carotid stenosis. History of stage 4 chronic kidney disease. EXAM: CT NECK WITHOUT CONTRAST TECHNIQUE: Multidetector CT imaging of the neck was performed following the standard protocol without intravenous contrast. COMPARISON:  MRA neck May 14, 2015 FINDINGS: Mild motion degraded examination. PHARYNX AND LARYNX: Normal.  Widely patent airway. SALIVARY GLANDS: Normal. THYROID: Normal. LYMPH NODES: No lymphadenopathy by CT size criteria. VASCULAR: Calcific atherosclerosis effacing the RIGHT internal carotid artery origin, and nearly effacing the LEFT internal carotid artery origin. Focal calcific atherosclerosis of the bilateral mid V2 segments. LIMITED INTRACRANIAL: Punctate calcifications RIGHT temporal occipital lobe. VISUALIZED ORBITS: Normal. MASTOIDS AND VISUALIZED PARANASAL SINUSES: Mild paranasal sinus mucosal thickening, status post FESS. Mastoid air cells are well aerated, pneumatized petrous apices. SKELETON: Nonacute. Multilevel moderate facet arthropathy. Congenital canal narrowing on the basis of foreshortened pedicles. UPPER CHEST: Lung apices are clear, centrilobular emphysema incompletely characterized. 11 mm short access  LEFT peritracheal lymph node, with additional subcentimeter pretracheal lymph node. Lymphadenopathy. OTHER: Calcified bilateral stylohyoid ligament. IMPRESSION: Severe calcific atherosclerosis of the internal carotid artery origins and suspected severe RIGHT greater than LEFT stenosis which would be better characterized on CTA neck if renal function improves. Calcific atherosclerosis and suspected stenosis mid intraforaminal vertebral artery's. Electronically Signed   By: Elon Alas M.D.   On: 05/21/2016 20:25   Mr Brain Wo Contrast  Result Date: 05/21/2016 CLINICAL DATA:  81 y/o  M; slurred speech and left facial droop. EXAM: MRI HEAD WITHOUT CONTRAST MRA HEAD WITHOUT CONTRAST TECHNIQUE: Multiplanar,  multiecho pulse sequences of the brain and surrounding structures were obtained without intravenous contrast. Angiographic images of the head were obtained using MRA technique without contrast. COMPARISON:  05/20/2016 CT of the head. MRI of the head dated 05/13/2015. MRA of the head dated 05/14/2015. FINDINGS: MRI HEAD FINDINGS Brain: Several small scattered cortical foci of low diffusion within the right posterior frontal lobe with involvement of precentral gyrus and additional subcentimeter focus within the right posterior temporal lobe compatible with acute/ early subacute infarction. Stable biparietal and right frontal chronic infarctions. Stable background of moderate chronic microvascular ischemic changes of white matter and parenchymal volume loss of the brain. Stable punctate focus of susceptibility hypointensity within the left medial parietal lobe and the right lateral frontal lobe compatible with hemosiderin deposition of old microhemorrhage. No new abnormal susceptibility hypointensity to indicate intracranial hemorrhage. Stable ventricle size. No extra-axial collection. Vascular: As below. Skull and upper cervical spine: Normal marrow signal. Sinuses/Orbits: Small left maxillary sinus mucous  retention cyst and mild diffuse paranasal sinus mucosal thickening. Postsurgical changes related to partial ethmoidectomy and maxillary antrostomy bilaterally. Trace right mastoid effusion. Orbits are unremarkable. Other: None. MRA HEAD FINDINGS Internal carotid arteries: Mild bilateral cavernous and paraclinoid segment lumen irregularity likely related to atherosclerosis is stable and without significant stenosis or aneurysm identified. Anterior cerebral arteries:  Patent. Middle cerebral arteries: Patent. Anterior communicating artery: Patent. Posterior communicating arteries: Not identified, likely hypoplastic or absent. Posterior cerebral arteries:  Patent. Basilar artery:  Patent. Vertebral arteries:  Patent.  Left dominant. No evidence of high-grade stenosis, large vessel occlusion, or aneurysm. IMPRESSION: 1. Several subcentimeter scattered foci of acute/early subacute infarction are present within the right posterior frontal lobe inclusive of precentral gyrus and the right posterior temporal lobe. No associated hemorrhage or significant mass effect. 2. Stable background of chronic infarctions, moderate microvascular ischemic changes of white matter, and moderate brain parenchymal volume loss. 3. Mild diffuse paranasal sinus disease. Trace right mastoid effusion. 4. Patent circle of Willis without evidence for high-grade stenosis, large vessel occlusion, or aneurysm. These results will be called to the ordering clinician or representative by the Radiologist Assistant, and communication documented in the PACS or zVision Dashboard. Electronically Signed   By: Kristine Garbe M.D.   On: 05/21/2016 00:26   Mr Jodene Nam Head/brain CM Cm  Result Date: 05/21/2016 CLINICAL DATA:  81 y/o  M; slurred speech and left facial droop. EXAM: MRI HEAD WITHOUT CONTRAST MRA HEAD WITHOUT CONTRAST TECHNIQUE: Multiplanar, multiecho pulse sequences of the brain and surrounding structures were obtained without intravenous  contrast. Angiographic images of the head were obtained using MRA technique without contrast. COMPARISON:  05/20/2016 CT of the head. MRI of the head dated 05/13/2015. MRA of the head dated 05/14/2015. FINDINGS: MRI HEAD FINDINGS Brain: Several small scattered cortical foci of low diffusion within the right posterior frontal lobe with involvement of precentral gyrus and additional subcentimeter focus within the right posterior temporal lobe compatible with acute/ early subacute infarction. Stable biparietal and right frontal chronic infarctions. Stable background of moderate chronic microvascular ischemic changes of white matter and parenchymal volume loss of the brain. Stable punctate focus of susceptibility hypointensity within the left medial parietal lobe and the right lateral frontal lobe compatible with hemosiderin deposition of old microhemorrhage. No new abnormal susceptibility hypointensity to indicate intracranial hemorrhage. Stable ventricle size. No extra-axial collection. Vascular: As below. Skull and upper cervical spine: Normal marrow signal. Sinuses/Orbits: Small left maxillary sinus mucous retention cyst and mild diffuse paranasal sinus mucosal thickening.  Postsurgical changes related to partial ethmoidectomy and maxillary antrostomy bilaterally. Trace right mastoid effusion. Orbits are unremarkable. Other: None. MRA HEAD FINDINGS Internal carotid arteries: Mild bilateral cavernous and paraclinoid segment lumen irregularity likely related to atherosclerosis is stable and without significant stenosis or aneurysm identified. Anterior cerebral arteries:  Patent. Middle cerebral arteries: Patent. Anterior communicating artery: Patent. Posterior communicating arteries: Not identified, likely hypoplastic or absent. Posterior cerebral arteries:  Patent. Basilar artery:  Patent. Vertebral arteries:  Patent.  Left dominant. No evidence of high-grade stenosis, large vessel occlusion, or aneurysm. IMPRESSION:  1. Several subcentimeter scattered foci of acute/early subacute infarction are present within the right posterior frontal lobe inclusive of precentral gyrus and the right posterior temporal lobe. No associated hemorrhage or significant mass effect. 2. Stable background of chronic infarctions, moderate microvascular ischemic changes of white matter, and moderate brain parenchymal volume loss. 3. Mild diffuse paranasal sinus disease. Trace right mastoid effusion. 4. Patent circle of Willis without evidence for high-grade stenosis, large vessel occlusion, or aneurysm. These results will be called to the ordering clinician or representative by the Radiologist Assistant, and communication documented in the PACS or zVision Dashboard. Electronically Signed   By: Kristine Garbe M.D.   On: 05/21/2016 00:26   Ct Head Code Stroke W/o Cm  Result Date: 05/20/2016 CLINICAL DATA:  Code stroke. Left-sided facial droop and slurred speech. EXAM: CT HEAD WITHOUT CONTRAST TECHNIQUE: Contiguous axial images were obtained from the base of the skull through the vertex without intravenous contrast. COMPARISON:  05/13/2015 FINDINGS: Brain: No evidence of acute infarction, hemorrhage, hydrocephalus, extra-axial collection or mass lesion/mass effect. There is a moderate area of remote infarct in the high right frontal cortex and white matter. Smaller right frontal/parietal infarct. Vascular: Extensive calcified atherosclerotic plaque. No hyperdense vessel. There is asymmetric sulcal calcification on the right, stable from prior and associated with remote infarcts- suspected remote embolic calcified plaque. Skull: No acute or aggressive finding Sinuses/Orbits: No acute finding Other: Text page with results sent on 05/20/2016 at 8:32 pm to Dr. Cheral Marker. ASPECTS [facility] Stroke Program Early CT Score) Not scored in the setting of remote right frontal infarct. IMPRESSION: 1. No acute finding. 2. Remote right frontal infarcts.  Associated sulcal calcification suggesting remote calcified plaque embolus. Electronically Signed   By: Monte Fantasia M.D.   On: 05/20/2016 20:36    Assessment/Plan: Diagnosis: Right posterior frontal and parietal infarcts, resulting in hemiparesis and left hemi-inattention 1. Does the need for close, 24 hr/day medical supervision in concert with the patient's rehab needs make it unreasonable for this patient to be served in a less intensive setting? Yes 2. Co-Morbidities requiring supervision/potential complications: X3AT, HTN, CKD IV 3. Due to bladder management, bowel management, safety, skin/wound care, disease management, medication administration, pain management and patient education, does the patient require 24 hr/day rehab nursing? Yes 4. Does the patient require coordinated care of a physician, rehab nurse, PT (1-2 hrs/day, 5 days/week), OT (1-2 hrs/day, 5 days/week) and SLP (.5-1 hrs/day, 5 days/week) to address physical and functional deficits in the context of the above medical diagnosis(es)? Yes Addressing deficits in the following areas: balance, endurance, locomotion, strength, transferring, bowel/bladder control, bathing, dressing, feeding, grooming, toileting, cognition, speech and psychosocial support 5. Can the patient actively participate in an intensive therapy program of at least 3 hrs of therapy per day at least 5 days per week? Yes 6. The potential for patient to make measurable gains while on inpatient rehab is excellent 7. Anticipated functional outcomes upon discharge  from inpatient rehab are modified independent and supervision  with PT, modified independent and supervision with OT, modified independent and supervision with SLP. 8. Estimated rehab length of stay to reach the above functional goals is: 12-15d 9. Does the patient have adequate social supports and living environment to accommodate these discharge functional goals? Yes 10. Anticipated D/C setting:  Home 11. Anticipated post D/C treatments: [place] therapy 12. Overall Rehab/Functional Prognosis: excellent  RECOMMENDATIONS: This patient's condition is appropriate for continued rehabilitative care in the following setting: CIR Patient has agreed to participate in recommended program. Yes Note that insurance prior authorization may be required for reimbursement for recommended care.  Comment:   Charlett Blake M.D. [facility] FAAPM&R (Sports Med, Neuromuscular Med) Diplomate Am Board of Electrodiagnostic Med  Flora Lipps 05/22/2016

## 2016-05-21 NOTE — Consult Note (Signed)
[facility] Consultation Note Requesting Physician:  Dr. Eliseo Squires Primary Nephrologist: Dr. Mercy Moore Reason for Consult:  Worsening renal function  HPI: The patient is a 81 y.o. year-old gentleman with PMH of HTN, DM, chronic dCHF, h/o prior stroke, HLD, CAD w/prior stent. Presented to the ED on 05/20/16 with slurred speech, transient L facial droop. Head CT was neg x for old R frontal strokes, Carotid US 60-79% R carotid stenosis, ECHO w/nl EF, Grade 1 DD. Pt being evaluated by Dr. Trula Slade for carotid endarterectomy. Very hypertensive on admission (210/95).  We are asked to see b/o CKD. 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 Pt is followed by Dr. Mercy Moore, last seen 04/18/16 with creatinine at that time 3.57. He is on Aranesp for anemia, has mild secondary HPT (PTH 116 03/2016) on TIW calcitriol. Has been evaluated for access by [facility] (Dr. Bridgett Larsson) for future R AVF but has not scheduled anything.  Pt's creatinine was noted to be 4.31 on admission, repeated 4.50. Not rechecked today. Has been given some slow IVF's as was felt dry on admission.    Date/Time Value  05/20/2016  4.50 (H)  05/20/2016  4.31 (H)  04/16/16 3.57 GFR 15 ([facility])  03/25/2016  3.19 (H)  05/17/2015  2.66 (H)  05/15/2015  3.00 (H)  05/14/2015  2.76 (H)  05/13/2015  2.60 (H)  05/13/2015  2.60 (H)     Past Medical History:  Diagnosis Date  . Allergy    year round  . Cataract   . Diabetes mellitus   . GERD (gastroesophageal reflux disease)   . Hearing deficit   . Hyperlipidemia   . Hypertension   . Myocardial infarction 2011  . Stroke (cerebrum) ([place])   . Thyroid disease    hypothyroid     Past Surgical History:  Procedure Laterality Date  . CORONARY ANGIOPLASTY WITH STENT PLACEMENT  07/2009   3 stents  . EP IMPLANTABLE DEVICE N/A 05/16/2015   Procedure: Loop Recorder Insertion;  Surgeon: Thompson Grayer, MD;  Location: [facility];  Service: Cardiovascular;  Laterality: N/A;  . insulin pump needs to be removed for mri    . stents    . TEE  WITHOUT CARDIOVERSION N/A 05/16/2015   Procedure: TRANSESOPHAGEAL ECHOCARDIOGRAM (TEE);  Surgeon: Thayer Headings, MD;  Location: [facility];  Service: Cardiovascular;  Laterality: N/A;     Family History  Problem Relation Age of Onset  . Dementia Maternal Grandfather    Social History:  reports that he has quit smoking. He has never used smokeless tobacco. He reports that he drinks about 1.2 oz of alcohol per week . He reports that he does not use drugs.   Allergies  Allergen Reactions  . Lisinopril Other (See Comments)    "made me lose my voice"     Prior to Admission medications   Medication Sig Start Date End Date Taking? Authorizing Provider  albuterol (PROVENTIL HFA;VENTOLIN HFA) 108 (90 Base) MCG/ACT inhaler Inhale 2 puffs into the lungs every 6 (six) hours as needed for wheezing or shortness of breath.   Yes Historical Provider, MD  aspirin 325 MG EC tablet Take 325 mg by mouth daily.   Yes Historical Provider, MD  calcitRIOL (ROCALTROL) 0.25 MCG capsule Take 0.25 mcg by mouth every Monday, Wednesday, and Friday.   Yes Historical Provider, MD  furosemide (LASIX) 40 MG tablet Take 40 mg by mouth 2 (two) times daily.   Yes Historical Provider, MD  hydrALAZINE (APRESOLINE) 100 MG tablet Take 100 mg by mouth 3 (three)  times daily.  06/13/15  Yes Historical Provider, MD  Insulin Human (INSULIN PUMP) SOLN Inject 1 each into the skin. Take 40 units Basal and 36 units bolus, humalog is in the pump   Yes Historical Provider, MD  levothyroxine (SYNTHROID, LEVOTHROID) 50 MCG tablet Take 50 mcg by mouth daily before breakfast.  03/03/15  Yes Historical Provider, MD  metoprolol succinate (TOPROL-XL) 50 MG 24 hr tablet Take 50 mg by mouth 2 (two) times daily.  07/12/15  Yes Historical Provider, MD  Multiple Vitamins-Minerals (MULTIVITAMIN WITH MINERALS) tablet Take 1 tablet by mouth daily.   Yes Historical Provider, MD  omeprazole (PRILOSEC) 20 MG capsule Take 1 capsule by mouth Daily. 04/20/11   Yes Historical Provider, MD  simvastatin (ZOCOR) 40 MG tablet Take 1 tablet (40 mg total) by mouth daily at 6 PM. 05/17/15  Yes Velvet Bathe, MD    Inpatient medications: . aspirin  300 mg Rectal Daily   Or  . aspirin  325 mg Oral Daily  . [START ON 05/22/2016] calcitRIOL  0.25 mcg Oral Q M,W,F  . ezetimibe  10 mg Oral Daily  . heparin  5,000 Units Subcutaneous Q8H  . insulin aspart  0-15 Units Subcutaneous TID WC  . insulin aspart  0-5 Units Subcutaneous QHS  . insulin glargine  35 Units Subcutaneous QHS  . levothyroxine  50 mcg Oral QAC breakfast  . multivitamin with minerals  1 tablet Oral Daily  . pantoprazole  40 mg Oral Daily  . simvastatin  40 mg Oral q1800     Review of Systems See HPI   Physical Exam:  Blood pressure (!) 196/84, pulse 77, temperature 98.2 F (36.8 C), temperature source Oral, resp. rate 17, height 5\' 8"  (1.727 m), weight 89 kg (196 lb 4.8 oz), SpO2 100 %.  Gen: Ruddy complected WM, lying in bed VS as noted Skin: no rash, cyanosis but very red faced Neck: ? Soft R carotid bruit Chest: Clear anteriorly Heart: Regular. S1S2 No S3. No murmur Abdomen: soft, no focal tenderness Ext: Trace pretibial edema Neuro: alert, Ox3   Recent Labs Lab 05/20/16 2043 05/20/16 2100  NA 139 140  K 4.5 4.5  CL 107 105  CO2 21*  --   GLUCOSE 262* 264*  BUN 53* 52*  CREATININE 4.31* 4.50*  CALCIUM 8.5*  --      Recent Labs Lab 05/20/16 2043  AST 19  ALT 19  ALKPHOS 84  BILITOT 0.4  PROT 5.7*  ALBUMIN 3.0*    Recent Labs Lab 05/20/16 2043 05/20/16 2100  WBC 7.0  --   NEUTROABS 5.0  --   HGB 12.3* 11.9*  HCT 37.3* 35.0*  MCV 86.9  --   PLT 145*  --      Recent Labs Lab 05/21/16 0106 05/21/16 0428 05/21/16 0800 05/21/16 1147 05/21/16 1645  GLUCAP 260* 272* 109* 162* 176*    Xrays/Other Studies: Dg Chest 2 View  Result Date: 05/21/2016 CLINICAL DATA:  Acute onset of shortness of breath and dysarthria. Initial encounter. EXAM: CHEST   2 VIEW COMPARISON:  Chest radiograph performed 05/13/2015 FINDINGS: The lungs are well-aerated and clear. There is no evidence of focal opacification, pleural effusion or pneumothorax. The heart is normal in size; the mediastinal contour is within normal limits. A loop recorder is noted. No acute osseous abnormalities are seen. IMPRESSION: No acute cardiopulmonary process seen. Electronically Signed   By: Garald Balding M.D.   On: 05/21/2016 00:44   Mr Brain Wo Contrast  Result Date: 05/21/2016 CLINICAL DATA:  81 y/o  M; slurred speech and left facial droop. EXAM: MRI HEAD WITHOUT CONTRAST MRA HEAD WITHOUT CONTRAST TECHNIQUE: Multiplanar, multiecho pulse sequences of the brain and surrounding structures were obtained without intravenous contrast. Angiographic images of the head were obtained using MRA technique without contrast. COMPARISON:  05/20/2016 CT of the head. MRI of the head dated 05/13/2015. MRA of the head dated 05/14/2015. FINDINGS: MRI HEAD FINDINGS Brain: Several small scattered cortical foci of low diffusion within the right posterior frontal lobe with involvement of precentral gyrus and additional subcentimeter focus within the right posterior temporal lobe compatible with acute/ early subacute infarction. Stable biparietal and right frontal chronic infarctions. Stable background of moderate chronic microvascular ischemic changes of white matter and parenchymal volume loss of the brain. Stable punctate focus of susceptibility hypointensity within the left medial parietal lobe and the right lateral frontal lobe compatible with hemosiderin deposition of old microhemorrhage. No new abnormal susceptibility hypointensity to indicate intracranial hemorrhage. Stable ventricle size. No extra-axial collection. Vascular: As below. Skull and upper cervical spine: Normal marrow signal. Sinuses/Orbits: Small left maxillary sinus mucous retention cyst and mild diffuse paranasal sinus mucosal thickening.  Postsurgical changes related to partial ethmoidectomy and maxillary antrostomy bilaterally. Trace right mastoid effusion. Orbits are unremarkable. Other: None. MRA HEAD FINDINGS Internal carotid arteries: Mild bilateral cavernous and paraclinoid segment lumen irregularity likely related to atherosclerosis is stable and without significant stenosis or aneurysm identified. Anterior cerebral arteries:  Patent. Middle cerebral arteries: Patent. Anterior communicating artery: Patent. Posterior communicating arteries: Not identified, likely hypoplastic or absent. Posterior cerebral arteries:  Patent. Basilar artery:  Patent. Vertebral arteries:  Patent.  Left dominant. No evidence of high-grade stenosis, large vessel occlusion, or aneurysm. IMPRESSION: 1. Several subcentimeter scattered foci of acute/early subacute infarction are present within the right posterior frontal lobe inclusive of precentral gyrus and the right posterior temporal lobe. No associated hemorrhage or significant mass effect. 2. Stable background of chronic infarctions, moderate microvascular ischemic changes of white matter, and moderate brain parenchymal volume loss. 3. Mild diffuse paranasal sinus disease. Trace right mastoid effusion. 4. Patent circle of Willis without evidence for high-grade stenosis, large vessel occlusion, or aneurysm. These results will be called to the ordering clinician or representative by the Radiologist Assistant, and communication documented in the PACS or zVision Dashboard. Electronically Signed   By: Kristine Garbe M.D.   On: 05/21/2016 00:26   Mr Jodene Nam Head/brain UD Cm  Result Date: 05/21/2016 CLINICAL DATA:  81 y/o  M; slurred speech and left facial droop. EXAM: MRI HEAD WITHOUT CONTRAST MRA HEAD WITHOUT CONTRAST TECHNIQUE: Multiplanar, multiecho pulse sequences of the brain and surrounding structures were obtained without intravenous contrast. Angiographic images of the head were obtained using MRA  technique without contrast. COMPARISON:  05/20/2016 CT of the head. MRI of the head dated 05/13/2015. MRA of the head dated 05/14/2015. FINDINGS: MRI HEAD FINDINGS Brain: Several small scattered cortical foci of low diffusion within the right posterior frontal lobe with involvement of precentral gyrus and additional subcentimeter focus within the right posterior temporal lobe compatible with acute/ early subacute infarction. Stable biparietal and right frontal chronic infarctions. Stable background of moderate chronic microvascular ischemic changes of white matter and parenchymal volume loss of the brain. Stable punctate focus of susceptibility hypointensity within the left medial parietal lobe and the right lateral frontal lobe compatible with hemosiderin deposition of old microhemorrhage. No new abnormal susceptibility hypointensity to indicate intracranial hemorrhage. Stable ventricle size. No extra-axial  collection. Vascular: As below. Skull and upper cervical spine: Normal marrow signal. Sinuses/Orbits: Small left maxillary sinus mucous retention cyst and mild diffuse paranasal sinus mucosal thickening. Postsurgical changes related to partial ethmoidectomy and maxillary antrostomy bilaterally. Trace right mastoid effusion. Orbits are unremarkable. Other: None. MRA HEAD FINDINGS Internal carotid arteries: Mild bilateral cavernous and paraclinoid segment lumen irregularity likely related to atherosclerosis is stable and without significant stenosis or aneurysm identified. Anterior cerebral arteries:  Patent. Middle cerebral arteries: Patent. Anterior communicating artery: Patent. Posterior communicating arteries: Not identified, likely hypoplastic or absent. Posterior cerebral arteries:  Patent. Basilar artery:  Patent. Vertebral arteries:  Patent.  Left dominant. No evidence of high-grade stenosis, large vessel occlusion, or aneurysm. IMPRESSION: 1. Several subcentimeter scattered foci of acute/early subacute  infarction are present within the right posterior frontal lobe inclusive of precentral gyrus and the right posterior temporal lobe. No associated hemorrhage or significant mass effect. 2. Stable background of chronic infarctions, moderate microvascular ischemic changes of white matter, and moderate brain parenchymal volume loss. 3. Mild diffuse paranasal sinus disease. Trace right mastoid effusion. 4. Patent circle of Willis without evidence for high-grade stenosis, large vessel occlusion, or aneurysm. These results will be called to the ordering clinician or representative by the Radiologist Assistant, and communication documented in the PACS or zVision Dashboard. Electronically Signed   By: Kristine Garbe M.D.   On: 05/21/2016 00:26   Ct Head Code Stroke W/o Cm  Result Date: 05/20/2016 CLINICAL DATA:  Code stroke. Left-sided facial droop and slurred speech. EXAM: CT HEAD WITHOUT CONTRAST TECHNIQUE: Contiguous axial images were obtained from the base of the skull through the vertex without intravenous contrast. COMPARISON:  05/13/2015 FINDINGS: Brain: No evidence of acute infarction, hemorrhage, hydrocephalus, extra-axial collection or mass lesion/mass effect. There is a moderate area of remote infarct in the high right frontal cortex and white matter. Smaller right frontal/parietal infarct. Vascular: Extensive calcified atherosclerotic plaque. No hyperdense vessel. There is asymmetric sulcal calcification on the right, stable from prior and associated with remote infarcts- suspected remote embolic calcified plaque. Skull: No acute or aggressive finding Sinuses/Orbits: No acute finding Other: Text page with results sent on 05/20/2016 at 8:32 pm to Dr. Cheral Marker. ASPECTS [facility] Stroke Program Early CT Score) Not scored in the setting of remote right frontal infarct. IMPRESSION: 1. No acute finding. 2. Remote right frontal infarcts. Associated sulcal calcification suggesting remote calcified plaque  embolus. Electronically Signed   By: Monte Fantasia M.D.   On: 05/20/2016 20:36    Background: 81 y.o. year-old gentleman with PMH of HTN, DM, chronic dCHF, h/o prior stroke, HLD. Presented to the ED on 05/20/16 with slurred speech, transient L facial droop. Head CT was neg x for old R frontal strokes, Carotid US 60-79% R carotid stenosis, ECHO w/nl EF, Grade 1 DD. Pt being evaluated by Dr. Trula Slade for carotid endarterectomy. Very hypertensive on admission (210/95) and currently on no BP meds.  We are asked to see b/o CKD. Pt is followed by Dr. Mercy Moore, last seen 04/18/16 with creatinine at that time 3.57. He is on Aranesp for anemia (100 mcg Q4wks, last Hb 11.1 on 04/23/16), has mild secondary HPT (PTH 116 03/2016) on TIW calcitriol. Has been evaluated for access by [facility] (Dr. Bridgett Larsson) for future R AVF but has not scheduled anything.  Pt's creatinine was noted to be 4.31 on admission, repeated 4.50. Not rechecked today. Has been given some slow IVF's as was felt dry on admission.  Assessment/Recommendations  1. 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 ASA/statin  presently .  2. CKD 4/5 (as of 03/2016) with AKI on CKD - currently GFR around 14 (was 15 late Feb w/creatinine 3.57 so really minimal worsening as of 3/26) (no lab today). On no kidney unfriendly meds, has not had excessive BP lowering (in fact BP's remain quite elevated). Suspect mostly just natural progression of disease. Has had AVF evaluation and eventual plan is for R AVF so need to save R arm.  3. Anemia - on outpt Aranesp. Hb 11.1 at time of last injection, 12.3/11.9 on admission. No needs at this time 4. Secondary HPT - continue calcitriol 5. HTN - per neuro "permissive HTN but gradually normalize in 5-7 days" with goal 130-150 prior to CEA. Home meds metoprolol 50 BID, hydralazine 100 TID, furosemide 40 BID - currently on no BP meds at all??? Has small amt LE edema. Low threshold to resume lasix. 6. Chronic dCHF  - lasix and metoprolol are currently both still on hold 7. HLD - continue meds 8. DM - per primary service 9. Hypothyroidism - usual levo  Jamal Maes,  MD [facility] [phone] pager 05/21/2016, 5:56 PM

## 2016-05-21 NOTE — Evaluation (Signed)
Physical Therapy Evaluation Patient Details Name: Robert [name] MRN: [id number] DOB: 02-Jul-1935 Today's Date: 05/21/2016   History of Present Illness  Robert [name] is a 81 y.o. male with medical history significant for hypertension, insulin-dependent diabetes mellitus, chronic diastolic CHF, history of cryptogenic CVA, and chronic kidney disease stage IV who presents to the emergency department with slurred speech, difficulty eating, and left facial droop.  Clinical Impression  Patient presents with decreased independence and safety with poor L side awareness, decreased L UE/LE strength, poor awareness of deficits and high fall risk per DGI (14/24,scores <19 indicate fall risk.)  He will benefit from skilled PT in the acute setting to allow return home with family support following CIR level rehab stay. Previously pt independent caring for his wife at home, now requires min A for safety with all mobility.     Follow Up Recommendations CIR    Equipment Recommendations  Other (comment) (TBA)    Recommendations for Other Services Rehab consult     Precautions / Restrictions Precautions Precautions: Fall Precaution Comments: decreased L awareness, R upper quadrant field cut      Mobility  Bed Mobility Overal bed mobility: Needs Assistance Bed Mobility: Supine to Sit     Supine to sit: Min assist     General bed mobility comments: pulled up on rail, assist for lifting trunk, to supine, cues for positioning  Transfers Overall transfer level: Needs assistance Equipment used: Rolling walker (2 wheeled) Transfers: Sit to/from Stand Sit to Stand: Min assist         General transfer comment: for balance, increased time to stand, heavy R UE support  Ambulation/Gait Ambulation/Gait assistance: Supervision;Min assist Ambulation Distance (Feet): 160 Feet Assistive device: Rolling walker (2 wheeled);None Gait Pattern/deviations: Step-through pattern;Decreased stride  length;Wide base of support;Shuffle     General Gait Details: occasionally drags L foot on floor, initially utilized walker with S, then without device as pt not holding on consistently with L UE S to minguard for safety as occasional dragging L foot and running into door facing on L   Stairs Stairs: Yes Stairs assistance: Min assist Stair Management: Step to pattern;One rail Left;One rail Right Number of Stairs: 2 General stair comments:  self selected pattern, leads up with R, stepping down with R first and slowly lowering with heavy UE support on railing and min A to prevent LOB as twisting body  Wheelchair Mobility    Modified Rankin (Stroke Patients Only) Modified Rankin (Stroke Patients Only) Pre-Morbid Rankin Score: No significant disability Modified Rankin: Moderately severe disability     Balance Overall balance assessment: Needs assistance   Sitting balance-Leahy Scale: Good       Standing balance-Leahy Scale: Fair                   Standardized Balance Assessment Standardized Balance Assessment : Dynamic Gait Index   Dynamic Gait Index Level Surface: Mild Impairment Change in Gait Speed: Moderate Impairment Gait with Horizontal Head Turns: Mild Impairment Gait with Vertical Head Turns: Normal Gait and Pivot Turn: Mild Impairment Step Over Obstacle: Moderate Impairment Step Around Obstacles: Mild Impairment Steps: Moderate Impairment Total Score: 14       Pertinent Vitals/Pain Pain Assessment: No/denies pain    Home Living Family/patient expects to be discharged to:: Private residence Living Arrangements: Spouse/significant other (wife uses a walker) Available Help at Discharge: Family Type of Home: House Home Access: Stairs to enter   [employer] of Steps: 4 from garage with  rail, 2-3 from back no rail Home Layout: Multi-level;Able to live on main level with bedroom/bathroom Home Equipment: Shower seat - built in;Bedside  commode      Prior Function Level of Independence: Independent         Comments: was helping take care of wife, children assist as well, pt drives     Hand Dominance        Extremity/Trunk Assessment   Upper Extremity Assessment Upper Extremity Assessment: LUE deficits/detail LUE Deficits / Details: decreased grip compared to R, strength shoulder flexion 4/5, elbow flexion 4/5, elbow extension 4-/5; reports intact sensation, but using walker did not get hand on walker without A    Lower Extremity Assessment Lower Extremity Assessment: LLE deficits/detail LLE Deficits / Details: AROM WFL, strength hip flexion 3+/5, knee extension 4/5, ankle DF 4/5; reports intact sensation       Communication   Communication: HOH  Cognition Arousal/Alertness: Awake/alert Behavior During Therapy: WFL for tasks assessed/performed Overall Cognitive Status: History of cognitive impairments - at baseline                                        General Comments      Exercises     Assessment/Plan    PT Assessment Patient needs continued PT services  PT Problem List Decreased mobility;Decreased strength;Decreased safety awareness;Decreased coordination;Decreased cognition;Decreased activity tolerance;Decreased balance;Decreased knowledge of use of DME;Impaired sensation       PT Treatment Interventions DME instruction;Gait training;Balance training;Functional mobility training;Neuromuscular re-education;Stair training;Therapeutic activities;Patient/family education    PT Goals (Current goals can be found in the Care Plan section)  Acute Rehab PT Goals Patient Stated Goal: To return home tomorrow PT Goal Formulation: With patient Time For Goal Achievement: 06/04/16 Potential to Achieve Goals: Good    Frequency Min 4X/week   Barriers to discharge Decreased caregiver support pt is wife's caregiver    Co-evaluation               End of Session Equipment  Utilized During Treatment: Gait belt Activity Tolerance: Patient tolerated treatment well Patient left: in bed;with call bell/phone within reach;with bed alarm set   PT Visit Diagnosis: Other abnormalities of gait and mobility (R26.89);Hemiplegia and hemiparesis Hemiplegia - Right/Left: Left Hemiplegia - dominant/non-dominant: Non-dominant Hemiplegia - caused by: Cerebral infarction    Time: 1340-1410 PT Time Calculation (min) (ACUTE ONLY): 30 min   Charges:   PT Evaluation $PT Eval Moderate Complexity: 1 Procedure PT Treatments $Gait Training: 8-22 mins   PT G Codes:   PT G-Codes **NOT FOR INPATIENT CLASS** Functional Assessment Tool Used: AM-PAC 6 Clicks Basic Mobility Functional Limitation: Mobility: Walking and moving around Mobility: Walking and Moving Around Current Status (E2683): At least 40 percent but less than 60 percent impaired, limited or restricted Mobility: Walking and Moving Around Goal Status [phone]): At least 20 percent but less than 40 percent impaired, limited or restricted    Fuig, [place] 05/21/2016   Reginia Naas 05/21/2016, 3:09 PM

## 2016-05-21 NOTE — Evaluation (Signed)
Occupational Therapy Evaluation Patient Details Name: Robert [name] MRN: [id number] DOB: February 27, 1935 Today's Date: 05/21/2016    History of Present Illness Robert [name] is a 81 y.o. male with medical history significant for hypertension, insulin-dependent diabetes mellitus, chronic diastolic CHF, history of cryptogenic CVA, and chronic kidney disease stage IV who presents to the emergency department with slurred speech, difficulty eating, and left facial droop. MRI revealed several scattered foci of acute/early subacute infarction in R posterior frontal lobe inclusive of precentral gyrus and R posterior temporal lobe.   Clinical Impression   PTA, pt was independent with ADL and functional mobility. Pt currently requires overall min assist with ADL tasks due to decreased L UE strength and coordination, decreased bimanual skills, and decreased short-term memory. He additionally presents with inattention to the L side of his body and decreased awareness of deficits impacting his ability to participate in ADL at [facility]. Feel pt would benefit from CIR placement to improve independence with ADL and maximize return to PLOF. OT will continue to follow acutely.    Follow Up Recommendations  CIR;Supervision/Assistance - 24 hour    Equipment Recommendations  Other (comment) (TBD at next venue of care)    Recommendations for Other Services       Precautions / Restrictions Precautions Precautions: Fall Precaution Comments: decreased L awareness, R visual field deficits Restrictions Weight Bearing Restrictions: No      Mobility Bed Mobility Overal bed mobility: Needs Assistance Bed Mobility: Supine to Sit;Sit to Supine     Supine to sit: Min assist Sit to supine: Min assist   General bed mobility comments: Min assist for trunk.  Transfers Overall transfer level: Needs assistance Equipment used: Rolling walker (2 wheeled) Transfers: Sit to/from Stand Sit to Stand: Min assist          General transfer comment: for balance, increased time to stand, heavy R UE support    Balance Overall balance assessment: Needs assistance Sitting-balance support: No upper extremity supported;Feet supported Sitting balance-Leahy Scale: Good     Standing balance support: Bilateral upper extremity supported;No upper extremity supported;During functional activity Standing balance-Leahy Scale: Fair Standing balance comment: Able to stand at sink without UE support to complete grooming tasks but unstable during dynamic activities without UE support.                 Standardized Balance Assessment Standardized Balance Assessment : Dynamic Gait Index   Dynamic Gait Index Level Surface: Mild Impairment Change in Gait Speed: Moderate Impairment Gait with Horizontal Head Turns: Mild Impairment Gait with Vertical Head Turns: Normal Gait and Pivot Turn: Mild Impairment Step Over Obstacle: Moderate Impairment Step Around Obstacles: Mild Impairment Steps: Moderate Impairment Total Score: 14     ADL either performed or assessed with clinical judgement   ADL Overall ADL's : Needs assistance/impaired Eating/Feeding: Minimal assistance;Sitting Eating/Feeding Details (indicate cue type and reason): Min assist to manage utensils and open containers. Grooming: Minimal assistance;Sitting Grooming Details (indicate cue type and reason): Min assist for B hand tasks. Upper Body Bathing: Minimal assistance;Sitting   Lower Body Bathing: Minimal assistance;Sit to/from stand   Upper Body Dressing : Minimal assistance;Sitting Upper Body Dressing Details (indicate cue type and reason): Min assist for bimanual tasks. Lower Body Dressing: Minimal assistance;Sit to/from stand   Toilet Transfer: Min guard;RW;BSC   Toileting- Water quality scientist and Hygiene: Min guard;Sit to/from stand       Functional mobility during ADLs: Min guard;Rolling walker General ADL Comments: Pt with decreased  awareness of  L sided deficits. He demonstrates significant difficulty with fine motor coordination but he does not acknowledge difficulties when asked.      Vision Baseline Vision/History: Wears glasses Wears Glasses: At all times Patient Visual Report: No change from baseline Vision Assessment?: Vision impaired- to be further tested in functional context Additional Comments: Pt missing items on counter required verbal and visual cues to locate. Pt with difficulty reading without his glasses. Will continue to assess.     Perception     Praxis      Pertinent Vitals/Pain Pain Assessment: No/denies pain     Hand Dominance Right   Extremity/Trunk Assessment Upper Extremity Assessment Upper Extremity Assessment: LUE deficits/detail LUE Deficits / Details: Decreased strength as compared to R (Shoulder flexion 4/5, elbow flexion 4/5, elbow extension 4-/5; grasp 4/5). Decreased coordination and difficulty manipulating utensils. Reports in tact sensation. Difficulty maintaining grasp on RW with R hand. LUE Coordination: decreased fine motor;decreased gross motor (overshoot/undershoot with FNF)   Lower Extremity Assessment Lower Extremity Assessment: Defer to PT evaluation LLE Deficits / Details: AROM WFL, strength hip flexion 3+/5, knee extension 4/5, ankle DF 4/5; reports intact sensation       Communication Communication Communication: HOH   Cognition Arousal/Alertness: Awake/alert Behavior During Therapy: WFL for tasks assessed/performed Overall Cognitive Status: History of cognitive impairments - at baseline                                 General Comments: Pt demonstrates slow processing and decreased short-term memory at baseline. Family assists with medication management.   General Comments       Exercises     Shoulder Instructions      Home Living Family/patient expects to be discharged to:: Private residence Living Arrangements: Spouse/significant  other (wife requires physical assistance and pt is caregiver) Available Help at Discharge: Family Type of Home: House Home Access: Stairs to enter [employer] of Steps: 4 from garage with rail, 2-3 from back no rail   Home Layout: Multi-level;Able to live on main level with bedroom/bathroom     Bathroom Shower/Tub: Occupational psychologist: Standard     Home Equipment: Shower seat - built in;Bedside commode;Grab bars - tub/shower          Prior Functioning/Environment Level of Independence: Independent        Comments: was helping take care of wife, children assist as well, pt drives        OT Problem List: Decreased strength;Decreased activity tolerance;Impaired balance (sitting and/or standing);Impaired vision/perception;Decreased coordination;Decreased cognition;Decreased safety awareness;Decreased knowledge of use of DME or AE;Decreased knowledge of precautions;Impaired UE functional use      OT Treatment/Interventions: Self-care/ADL training;Therapeutic exercise;Energy conservation;Therapeutic activities;Cognitive remediation/compensation;Visual/perceptual remediation/compensation;Patient/family education;Balance training;DME and/or AE instruction;Neuromuscular education    OT Goals(Current goals can be found in the care plan section) Acute Rehab OT Goals Patient Stated Goal: To return home tomorrow OT Goal Formulation: With patient Time For Goal Achievement: 06/04/16 Potential to Achieve Goals: Good ADL Goals Pt Will Perform Grooming: with modified independence;standing (3 tasks) Pt Will Perform Lower Body Bathing: with modified independence;sit to/from stand Pt Will Perform Lower Body Dressing: with modified independence;sit to/from stand Pt Will Transfer to Toilet: with modified independence;ambulating;bedside commode (BSC over toilet) Pt Will Perform Toileting - Clothing Manipulation and hygiene: with modified independence;sit to/from  stand Pt/caregiver will Perform Home Exercise Program: Increased strength;Left upper extremity;With written HEP provided;Independently (increased strength, gross motor coordination,  and fine motor) Additional ADL Goal #1: Pt will identify 3 strategies to compensate for decreased short-term memory to improve independence with medication management.  OT Frequency: Min 2X/week   Barriers to D/C:            Co-evaluation              End of Session Equipment Utilized During Treatment: Gait belt;Rolling walker Nurse Communication: Mobility status  Activity Tolerance: Patient tolerated treatment well Patient left: in bed;with call bell/phone within reach;with bed alarm set (Sitting at EOB with alarm set eating (no recliner in room))  OT Visit Diagnosis: Unsteadiness on feet (R26.81);Ataxia, unspecified (R27.0);Hemiplegia and hemiparesis Hemiplegia - Right/Left: Left Hemiplegia - dominant/non-dominant: Non-Dominant Hemiplegia - caused by: Cerebral infarction                Time: 5834-6219 OT Time Calculation (min): 22 min Charges:  OT General Charges $OT Visit: 1 Procedure OT Evaluation $OT Eval Moderate Complexity: 1 Procedure G-Codes:     Norman Herrlich, MS OTR/L  Pager: [place] A Allison Silva 05/21/2016, 4:25 PM

## 2016-05-21 NOTE — Progress Notes (Signed)
OT Cancellation Note  Patient Details Name: NIKOS ANGLEMYER MRN: [id number] DOB: November 06, 1935   Cancelled Treatment:    Reason Eval/Treat Not Completed: Patient at procedure or test/ unavailable. Pt off unit for echo at this time. Will check back as able for OT evaluation.  Norman Herrlich, MS OTR/L  Pager: [place] A Mateen Franssen 05/21/2016, 11:02 AM

## 2016-05-21 NOTE — Evaluation (Signed)
Speech Language Pathology Evaluation Patient Details Name: Robert [name] MRN: [id number] DOB: 1935-07-04 Today's Date: 05/21/2016 Time: 5427-0623 SLP Time Calculation (min) (ACUTE ONLY): 53 min  Problem List:  Patient Active Problem List   Diagnosis Date Noted  . AKI (acute kidney injury) ([place]) 05/20/2016  . Dysarthria 05/20/2016  . Thrombocytopenia ([place]) 05/20/2016  . Chronic diastolic CHF (congestive heart failure) ([place]) 05/20/2016  . Slurred speech   . Chronic kidney disease (CKD), stage IV (severe) ([place]) 03/20/2016  . Acute right MCA stroke ([place])   . History of stroke 05/13/2015  . Acute encephalopathy 05/13/2015  . Hypertensive urgency 05/13/2015  . Hyperlipidemia 05/13/2015  . Diabetes mellitus, type II, insulin dependent ([place]) 08/18/2009  . Anemia 08/18/2009  . Essential hypertension 08/18/2009  . ALLERGIC RHINITIS, SEASONAL 08/18/2009  . PARAPHARYNGEAL ABSCESS 08/18/2009  . GERD 08/18/2009  . HIATAL HERNIA 08/18/2009  . DIVERTICULOSIS, COLON 08/18/2009  . 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 ARTERIOVENOUS MALFORMATION, COLON 08/18/2009  . DYSPHAGIA 08/18/2009  . COLONIC POLYPS, HX OF 08/18/2009  . RENAL CALCULUS, HX OF 08/18/2009  . ESOPHAGEAL STRICTURE 06/21/2009   Past Medical History:  Past Medical History:  Diagnosis Date  . Allergy    year round  . Cataract   . Diabetes mellitus   . GERD (gastroesophageal reflux disease)   . Hearing deficit   . Hyperlipidemia   . Hypertension   . Myocardial infarction 2011  . Stroke (cerebrum) ([place])   . Thyroid disease    hypothyroid   Past Surgical History:  Past Surgical History:  Procedure Laterality Date  . CORONARY ANGIOPLASTY WITH STENT PLACEMENT  07/2009   3 stents  . EP IMPLANTABLE DEVICE N/A 05/16/2015   Procedure: Loop Recorder Insertion;  Surgeon: Thompson Grayer, MD;  Location: [facility];  Service: Cardiovascular;  Laterality: N/A;  . insulin pump needs to be removed for  mri    . stents    . TEE WITHOUT CARDIOVERSION N/A 05/16/2015   Procedure: TRANSESOPHAGEAL ECHOCARDIOGRAM (TEE);  Surgeon: Thayer Headings, MD;  Location: [facility] ENDOSCOPY;  Service: Cardiovascular;  Laterality: N/A;   HPI:  Robert [name] is a 81 y.o. male with medical history significant for hypertension, insulin-dependent diabetes mellitus, chronic diastolic CHF, history of cryptogenic CVA, and chronic kidney disease stage IV who presents to the emergency department with slurred speech, difficulty eating, and left facial droop. Patient reports that he was having an uneventful day this morning, and was doing some work on a computer at home in the afternoon when he decided to take a nap at approximately 3 PM. He reports waking at approximately 5:45 PM with a sensation of something stuck in his throat that he cannot clear. He then noted his speech to be slurred. He denied any associated chest pain or palpitations and denies any headache, change in vision or hearing, or focal numbness or weakness. There been no recent fall or trauma and he denies use of illicit substances or alcohol. He reports similar symptoms at time of his prior stroke. Patient reports going to a restaurant right after noticing the symptoms, had some difficulties swallowing and noted that the kept biting his tongue. Pt MRI showed right posterior frontal lobe CVA - early acute/subacute.  Pt is HOH.     Assessment / Plan / Recommendation Clinical Impression  Pt presents with mild dysarthria resulting in imprecise articulation with mildly decreased intelligibility.  Pt adapts speech well by overarticulating and slowing rate to improve intelligiblity with mod  I.  MOCA score was 25/30- indicating a mild deficits  Only area of deficit was memory subtest - with pt recalling no words without cues indicating retrieval deficit.  Since pt has baseline memory deficits, suspect this is baseline and pt/daughter agree.  Provided pt with memory  compensation strategies in writing and reviewed orally with pt using teach back.  Also advised Robert [name] to consider program on cell phone to provide pt with cue when to take medications.  No SLP follow up recommended at this time as pt compensating well and has good support.  If family/pt note deficits at home,  [place] may be beneficial.  Thanks for this consult.     SLP Assessment  SLP Recommendation/Assessment: Patient does not need any further Speech Lanaguage Pathology Services SLP Visit Diagnosis: Cognitive communication deficit (R41.841)    Follow Up Recommendations  None    Frequency and Duration           SLP Evaluation Cognition  Overall Cognitive Status: History of cognitive impairments - at baseline (pt with difficulty taking medication 1/3 of time prior to admission - per his and daughter statement) Arousal/Alertness: Awake/alert Orientation Level: Oriented X4 Attention: Sustained Sustained Attention: Appears intact Memory: Impaired Memory Impairment: Retrieval deficit (0/5 recalled, 3/5 category cue, 2/5 multiple choice) Awareness: Appears intact Problem Solving: Appears intact Safety/Judgment: Appears intact       Comprehension  Auditory Comprehension Overall Auditory Comprehension: Appears within functional limits for tasks assessed Yes/No Questions: Not tested Commands: Within Functional Limits Conversation: Complex Interfering Components: Hearing EffectiveTechniques: Extra processing time Visual Recognition/Discrimination Discrimination: Not tested Reading Comprehension Reading Status: Within funtional limits    Expression Expression Primary Mode of Expression: Verbal Verbal Expression Overall Verbal Expression: Appears within functional limits for tasks assessed Initiation: No impairment Repetition: No impairment Naming: No impairment Pragmatics: No impairment Written Expression Dominant Hand: Right Written Expression: Within Functional Limits   Oral  / Motor  Oral Motor/Sensory Function Overall Oral Motor/Sensory Function: Mild impairment Facial ROM: Reduced left Facial Symmetry: Abnormal symmetry left Facial Strength: Reduced left Lingual ROM: Within Functional Limits Lingual Symmetry: Within Functional Limits Lingual Strength: Within Functional Limits Lingual Sensation: Within Functional Limits Velum: Within Functional Limits Mandible: Within Functional Limits Motor Speech Overall Motor Speech: Appears within functional limits for tasks assessed Respiration: Impaired Level of Impairment: Sentence (baseline) Phonation: Hoarse;Low vocal intensity (baseline) Resonance: Within functional limits Articulation: Impaired Intelligibility: Intelligible Motor Planning: Not tested Motor Speech Errors: Not applicable Effective Techniques: Slow rate   GO          Functional Assessment Tool Used: clinical judgement Functional Limitations: Motor speech Motor Speech Current Status [phone]): At least 20 percent but less than 40 percent impaired, limited or restricted Motor Speech Goal Status [phone]): At least 20 percent but less than 40 percent impaired, limited or restricted Motor Speech Goal Status [phone]): At least 20 percent but less than 40 percent impaired, limited or restricted         Claudie Fisherman, [place] [facility] SLP [phone]

## 2016-05-21 NOTE — Progress Notes (Signed)
Loop recorder interrogated. No AF pr other arrhythmia identified.  Mar[place]hal [place] for Pager information 05/21/2016 3:44 PM

## 2016-05-21 NOTE — Progress Notes (Signed)
PROGRESS NOTE    Robert [name]  XTG:[id number] DOB: March 26, 1935 DOA: 05/20/2016 PCP: Geoffery Lyons, MD   Outpatient Specialists:     Brief Narrative:  Robert [name] is a 81 y.o. male with medical history significant for hypertension, insulin-dependent diabetes mellitus, chronic diastolic CHF, history of cryptogenic CVA, and chronic kidney disease stage IV who presents to the emergency department with slurred speech, difficulty eating, and left facial droop. Patient reports that he was having an uneventful day this morning, and was doing some work on a computer at home in the afternoon when he decided to take a nap at approximately 3 PM. He reports waking at approximately 5:45 PM with a sensation of something stuck in his throat that he cannot clear. He then noted his speech to be slurred. He denied any associated chest pain or palpitations and denies any headache, change in vision or hearing, or focal numbness or weakness. There been no recent fall or trauma and he denies use of illicit substances or alcohol. He reports similar symptoms at time of his prior stroke. Patient reports going to a restaurant right after noticing the symptoms, had some difficulties swallowing and noted that the kept biting his tongue. He then went to visit a grandchild, but was noted by his daughter to have slurred speech and left facial droop and was taken to the ED instead.   Assessment & Plan:   Principal Problem:   Dysarthria Active Problems:   Diabetes mellitus, type II, insulin dependent (HCC)   Anemia   Essential hypertension   GERD   History of stroke   Hypertensive urgency   Chronic kidney disease (CKD), stage IV (severe) (HCC)   AKI (acute kidney injury) (HCC)   Thrombocytopenia (HCC)   Chronic diastolic CHF (congestive heart failure) (HCC)   +CVA  Due to medications non-compliance - Neurology is consulting and much appreciated  - tPA not given due to low stroke score   -MRI/MRA + for  CVA - carotid [id number] 60-79% ICA stenosis-- vascular consult - echo: Normal LV systolic function; grade 1 diastolic dysfunction with   elevated LV filling pressure; severe MAC; trace MR and TR.  -A1c pending -fasting lipids: triglycerides 759 - PT, OT, and SLP evals  - Continue ASA 325 and high-intensity statin  -patient does not take medications on a daily basis  Hypertension with hypertensive urgency  - BP 210/95 on arrival - Managed at home with Toprol and hydralazine, currently held  - Labetalol IVP's prn SBP >210, or DBP >110; resume home antihypertensives and bring BP back to goal in coming days   AKI superimposed on CKD stage IV  - SCr is 4.51, up from apparent baseline of ~3.2  - Appears dry on admission; plan to hold Lasix and provide a gentle IVF hydration  -nephrology consult  Chronic diastolic CHF  - TTE (9/38/18) with EF 29-93%, grade 1 diastolic dysfunction, moderate LVH  - Managed at home with Lasix 40 mg BID and metoprolol; these are held currently  - Follow daily wts and strict I/O's, daily chem panel    Insulin-dependent DM  - A1c was 8.8% in March 2017  - Managed at home with Humalog pump, 46 units basal, and 36 units bolus per day  - Start Lantus 35 units with SSI  Hypothyroidism  - continue Synthroid    DVT prophylaxis:  SQ Heparin  Code Status: Full Code   Family Communication:   Disposition Plan:     Consultants:   Vascular  neurology     Subjective: Says he must be discharged by tomm AM to see his grandson's baseball game  Objective: Vitals:   05/21/16 0300 05/21/16 0500 05/21/16 0657 05/21/16 0900  BP: (!) 198/77 (!) 160/52 (!) 160/80 (!) 160/73  Pulse: 79 68 72 70  Resp: 20 20 20 18   Temp:   98.6 F (37 C)   TempSrc:   Oral   SpO2: 96% 96% 96% 96%  Weight:      Height:       No intake or output data in the 24 hours ending 05/21/16 1307 Filed Weights   05/21/16 0100  Weight: 89 kg (196 lb 4.8 oz)     Examination:  General exam: Appears calm and comfortable - speech slurred Respiratory system: Clear to auscultation. Respiratory effort normal. Cardiovascular system: S1 & S2 heard, RRR. No JVD, murmurs, rubs, gallops or clicks. No pedal edema. Gastrointestinal system: Abdomen is nondistended, soft and nontender. No organomegaly or masses felt. Normal bowel sounds heard. Central nervous system: Alert and oriented. No focal neurological deficits.      Data Reviewed: I have personally reviewed following labs and imaging studies  CBC:  Recent Labs Lab 05/20/16 2043 05/20/16 2100  WBC 7.0  --   NEUTROABS 5.0  --   HGB 12.3* 11.9*  HCT 37.3* 35.0*  MCV 86.9  --   PLT 145*  --    Basic Metabolic Panel:  Recent Labs Lab 05/20/16 2043 05/20/16 2100  NA 139 140  K 4.5 4.5  CL 107 105  CO2 21*  --   GLUCOSE 262* 264*  BUN 53* 52*  CREATININE 4.31* 4.50*  CALCIUM 8.5*  --    GFR: Estimated Creatinine Clearance: 14.7 mL/min (A) (by C-G formula based on SCr of 4.5 mg/dL (H)). Liver Function Tests:  Recent Labs Lab 05/20/16 2043  AST 19  ALT 19  ALKPHOS 84  BILITOT 0.4  PROT 5.7*  ALBUMIN 3.0*   No results for input(s): LIPASE, AMYLASE in the last 168 hours. No results for input(s): AMMONIA in the last 168 hours. Coagulation Profile:  Recent Labs Lab 05/20/16 2043  INR 0.99   Cardiac Enzymes: No results for input(s): CKTOTAL, CKMB, CKMBINDEX, TROPONINI in the last 168 hours. BNP (last 3 results) No results for input(s): PROBNP in the last 8760 hours. HbA1C: No results for input(s): HGBA1C in the last 72 hours. CBG:  Recent Labs Lab 05/20/16 2100 05/21/16 0106 05/21/16 0428 05/21/16 0800 05/21/16 1147  GLUCAP 259* 260* 272* 109* 162*   Lipid Profile:  Recent Labs  05/21/16 0352  CHOL 143  HDL 22*  LDLCALC UNABLE TO CALCULATE IF TRIGLYCERIDE OVER 400 mg/dL  TRIG 749*  CHOLHDL 6.5   Thyroid Function Tests: No results for input(s): TSH,  T4TOTAL, FREET4, T3FREE, THYROIDAB in the last 72 hours. Anemia Panel: No results for input(s): VITAMINB12, FOLATE, FERRITIN, TIBC, IRON, RETICCTPCT in the last 72 hours. Urine analysis:    Component Value Date/Time   COLORURINE YELLOW 05/21/2016 [place] 05/21/2016 0359   LABSPEC 1.012 05/21/2016 0359   PHURINE 6.0 05/21/2016 0359   GLUCOSEU >=500 (A) 05/21/2016 0359   HGBUR NEGATIVE 05/21/2016 0359   BILIRUBINUR NEGATIVE 05/21/2016 0359   KETONESUR NEGATIVE 05/21/2016 0359   PROTEINUR >=300 (A) 05/21/2016 0359   UROBILINOGEN 0.2 06/19/2009 1333   NITRITE NEGATIVE 05/21/2016 0359   LEUKOCYTESUR NEGATIVE 05/21/2016 0359     )No results found for this or any previous visit (from the  past 240 hour(s)).    Anti-infectives    None       Radiology Studies: Dg Chest 2 View  Result Date: 05/21/2016 CLINICAL DATA:  Acute onset of shortness of breath and dysarthria. Initial encounter. EXAM: CHEST  2 VIEW COMPARISON:  Chest radiograph performed 05/13/2015 FINDINGS: The lungs are well-aerated and clear. There is no evidence of focal opacification, pleural effusion or pneumothorax. The heart is normal in size; the mediastinal contour is within normal limits. A loop recorder is noted. No acute osseous abnormalities are seen. IMPRESSION: No acute cardiopulmonary process seen. Electronically Signed   By: Garald Balding M.D.   On: 05/21/2016 00:44   Mr Brain Wo Contrast  Result Date: 05/21/2016 CLINICAL DATA:  81 y/o  M; slurred speech and left facial droop. EXAM: MRI HEAD WITHOUT CONTRAST MRA HEAD WITHOUT CONTRAST TECHNIQUE: Multiplanar, multiecho pulse sequences of the brain and surrounding structures were obtained without intravenous contrast. Angiographic images of the head were obtained using MRA technique without contrast. COMPARISON:  05/20/2016 CT of the head. MRI of the head dated 05/13/2015. MRA of the head dated 05/14/2015. FINDINGS: MRI HEAD FINDINGS Brain: Several  small scattered cortical foci of low diffusion within the right posterior frontal lobe with involvement of precentral gyrus and additional subcentimeter focus within the right posterior temporal lobe compatible with acute/ early subacute infarction. Stable biparietal and right frontal chronic infarctions. Stable background of moderate chronic microvascular ischemic changes of white matter and parenchymal volume loss of the brain. Stable punctate focus of susceptibility hypointensity within the left medial parietal lobe and the right lateral frontal lobe compatible with hemosiderin deposition of old microhemorrhage. No new abnormal susceptibility hypointensity to indicate intracranial hemorrhage. Stable ventricle size. No extra-axial collection. Vascular: As below. Skull and upper cervical spine: Normal marrow signal. Sinuses/Orbits: Small left maxillary sinus mucous retention cyst and mild diffuse paranasal sinus mucosal thickening. Postsurgical changes related to partial ethmoidectomy and maxillary antrostomy bilaterally. Trace right mastoid effusion. Orbits are unremarkable. Other: None. MRA HEAD FINDINGS Internal carotid arteries: Mild bilateral cavernous and paraclinoid segment lumen irregularity likely related to atherosclerosis is stable and without significant stenosis or aneurysm identified. Anterior cerebral arteries:  Patent. Middle cerebral arteries: Patent. Anterior communicating artery: Patent. Posterior communicating arteries: Not identified, likely hypoplastic or absent. Posterior cerebral arteries:  Patent. Basilar artery:  Patent. Vertebral arteries:  Patent.  Left dominant. No evidence of high-grade stenosis, large vessel occlusion, or aneurysm. IMPRESSION: 1. Several subcentimeter scattered foci of acute/early subacute infarction are present within the right posterior frontal lobe inclusive of precentral gyrus and the right posterior temporal lobe. No associated hemorrhage or significant mass  effect. 2. Stable background of chronic infarctions, moderate microvascular ischemic changes of white matter, and moderate brain parenchymal volume loss. 3. Mild diffuse paranasal sinus disease. Trace right mastoid effusion. 4. Patent circle of Willis without evidence for high-grade stenosis, large vessel occlusion, or aneurysm. These results will be called to the ordering clinician or representative by the Radiologist Assistant, and communication documented in the PACS or zVision Dashboard. Electronically Signed   By: Kristine Garbe M.D.   On: 05/21/2016 00:26   Mr Jodene Nam Head/brain WN Cm  Result Date: 05/21/2016 CLINICAL DATA:  81 y/o  M; slurred speech and left facial droop. EXAM: MRI HEAD WITHOUT CONTRAST MRA HEAD WITHOUT CONTRAST TECHNIQUE: Multiplanar, multiecho pulse sequences of the brain and surrounding structures were obtained without intravenous contrast. Angiographic images of the head were obtained using MRA technique without contrast. COMPARISON:  05/20/2016  CT of the head. MRI of the head dated 05/13/2015. MRA of the head dated 05/14/2015. FINDINGS: MRI HEAD FINDINGS Brain: Several small scattered cortical foci of low diffusion within the right posterior frontal lobe with involvement of precentral gyrus and additional subcentimeter focus within the right posterior temporal lobe compatible with acute/ early subacute infarction. Stable biparietal and right frontal chronic infarctions. Stable background of moderate chronic microvascular ischemic changes of white matter and parenchymal volume loss of the brain. Stable punctate focus of susceptibility hypointensity within the left medial parietal lobe and the right lateral frontal lobe compatible with hemosiderin deposition of old microhemorrhage. No new abnormal susceptibility hypointensity to indicate intracranial hemorrhage. Stable ventricle size. No extra-axial collection. Vascular: As below. Skull and upper cervical spine: Normal marrow  signal. Sinuses/Orbits: Small left maxillary sinus mucous retention cyst and mild diffuse paranasal sinus mucosal thickening. Postsurgical changes related to partial ethmoidectomy and maxillary antrostomy bilaterally. Trace right mastoid effusion. Orbits are unremarkable. Other: None. MRA HEAD FINDINGS Internal carotid arteries: Mild bilateral cavernous and paraclinoid segment lumen irregularity likely related to atherosclerosis is stable and without significant stenosis or aneurysm identified. Anterior cerebral arteries:  Patent. Middle cerebral arteries: Patent. Anterior communicating artery: Patent. Posterior communicating arteries: Not identified, likely hypoplastic or absent. Posterior cerebral arteries:  Patent. Basilar artery:  Patent. Vertebral arteries:  Patent.  Left dominant. No evidence of high-grade stenosis, large vessel occlusion, or aneurysm. IMPRESSION: 1. Several subcentimeter scattered foci of acute/early subacute infarction are present within the right posterior frontal lobe inclusive of precentral gyrus and the right posterior temporal lobe. No associated hemorrhage or significant mass effect. 2. Stable background of chronic infarctions, moderate microvascular ischemic changes of white matter, and moderate brain parenchymal volume loss. 3. Mild diffuse paranasal sinus disease. Trace right mastoid effusion. 4. Patent circle of Willis without evidence for high-grade stenosis, large vessel occlusion, or aneurysm. These results will be called to the ordering clinician or representative by the Radiologist Assistant, and communication documented in the PACS or zVision Dashboard. Electronically Signed   By: Kristine Garbe M.D.   On: 05/21/2016 00:26   Ct Head Code Stroke W/o Cm  Result Date: 05/20/2016 CLINICAL DATA:  Code stroke. Left-sided facial droop and slurred speech. EXAM: CT HEAD WITHOUT CONTRAST TECHNIQUE: Contiguous axial images were obtained from the base of the skull through  the vertex without intravenous contrast. COMPARISON:  05/13/2015 FINDINGS: Brain: No evidence of acute infarction, hemorrhage, hydrocephalus, extra-axial collection or mass lesion/mass effect. There is a moderate area of remote infarct in the high right frontal cortex and white matter. Smaller right frontal/parietal infarct. Vascular: Extensive calcified atherosclerotic plaque. No hyperdense vessel. There is asymmetric sulcal calcification on the right, stable from prior and associated with remote infarcts- suspected remote embolic calcified plaque. Skull: No acute or aggressive finding Sinuses/Orbits: No acute finding Other: Text page with results sent on 05/20/2016 at 8:32 pm to Dr. Cheral Marker. ASPECTS [facility] Stroke Program Early CT Score) Not scored in the setting of remote right frontal infarct. IMPRESSION: 1. No acute finding. 2. Remote right frontal infarcts. Associated sulcal calcification suggesting remote calcified plaque embolus. Electronically Signed   By: Monte Fantasia M.D.   On: 05/20/2016 20:36        Scheduled Meds: . aspirin  300 mg Rectal Daily   Or  . aspirin  325 mg Oral Daily  . [START ON 05/22/2016] calcitRIOL  0.25 mcg Oral Q M,W,F  . ezetimibe  10 mg Oral Daily  . heparin  5,000 Units  Subcutaneous Q8H  . insulin aspart  0-15 Units Subcutaneous TID WC  . insulin aspart  0-5 Units Subcutaneous QHS  . insulin glargine  35 Units Subcutaneous QHS  . levothyroxine  50 mcg Oral QAC breakfast  . multivitamin with minerals  1 tablet Oral Daily  . pantoprazole  40 mg Oral Daily  . simvastatin  40 mg Oral q1800   Continuous Infusions:   LOS: 0 days    Time spent: 25 min    [place], DO [facility] Pager [phone]  If 7PM-7AM, please contact night-coverage [email or web address] Password [email or web address] 05/21/2016, 1:07 PM

## 2016-05-21 NOTE — Progress Notes (Addendum)
*  PRELIMINARY RESULTS* Vascular Ultrasound Carotid Duplex (Doppler) has been completed.  Preliminary findings: Right 60-79% ICA stenosis, low end of scale. Left 1-39% ICA stenosis, high end of scale. Antegrade vertebral flow.   Lower extremity venous duplex completed: No evidence of DVT or baker's cyst.   Landry Mellow, RDMS, RVT  05/21/2016, 11:17 AM

## 2016-05-21 NOTE — Progress Notes (Signed)
  Echocardiogram 2D Echocardiogram has been performed.  Jennette Dubin 05/21/2016, 11:35 AM

## 2016-05-21 NOTE — Progress Notes (Signed)
STROKE TEAM PROGRESS NOTE   HISTORY OF PRESENT ILLNESS (per record) Robert [name] is an 81 y.o. male who was LKN at "about 1:30 before taking a nap". On awakening, his voice was hoarse. He drove to his daughter's house; she felt that his speech was slurred and that he was having a stroke. She drove him to the [facility] ED where a Code Stroke was called. At time of CT scan, the patient denies any weakness. He also denies vision changes. The patient states that he has acid reflux but has not become hoarse from this in the past. Patient was LKW 05/20/2016 at 1:30 PM. NIHSS: 2 (mild dysarthria and left hand sensory impairment) Patient was not administered IV t-PA secondary to out of IV tPA time window. He was admitted for further evaluation and treatment.   SUBJECTIVE (INTERVAL HISTORY) Daughter is at bedside. Pt still has mild left facial droop, but hoarseness much improved and slurry speech also improved some. Cre worsened to 4.5 now. CUS showed worsening right ICA stenosis, will need VVS consultation.    OBJECTIVE Temp:  [97.5 F (36.4 C)-98.6 F (37 C)] 98.6 F (37 C) (03/27 0657) Pulse Rate:  [63-79] 70 (03/27 0900) Cardiac Rhythm: Normal sinus rhythm (03/27 0700) Resp:  [17-24] 18 (03/27 0900) BP: (160-210)/(52-99) 160/73 (03/27 0900) SpO2:  [94 %-99 %] 96 % (03/27 0900) Weight:  [89 kg (196 lb 4.8 oz)] 89 kg (196 lb 4.8 oz) (03/27 0100)  CBC:  Recent Labs Lab 05/20/16 2043 05/20/16 2100  WBC 7.0  --   NEUTROABS 5.0  --   HGB 12.3* 11.9*  HCT 37.3* 35.0*  MCV 86.9  --   PLT 145*  --     Basic Metabolic Panel:  Recent Labs Lab 05/20/16 2043 05/20/16 2100  NA 139 140  K 4.5 4.5  CL 107 105  CO2 21*  --   GLUCOSE 262* 264*  BUN 53* 52*  CREATININE 4.31* 4.50*  CALCIUM 8.5*  --     Lipid Panel:    Component Value Date/Time   CHOL 143 05/21/2016 0352   TRIG 749 (H) 05/21/2016 0352   HDL 22 (L) 05/21/2016 0352   CHOLHDL 6.5 05/21/2016 0352   VLDL UNABLE TO CALCULATE  IF TRIGLYCERIDE OVER 400 mg/dL 05/21/2016 0352   LDLCALC UNABLE TO CALCULATE IF TRIGLYCERIDE OVER 400 mg/dL 05/21/2016 0352   HgbA1c:  Lab Results  Component Value Date   HGBA1C 8.8 (H) 05/14/2015   Urine Drug Screen:    Component Value Date/Time   LABOPIA NONE DETECTED 05/21/2016 0359   COCAINSCRNUR NONE DETECTED 05/21/2016 0359   LABBENZ NONE DETECTED 05/21/2016 0359   AMPHETMU NONE DETECTED 05/21/2016 0359   THCU NONE DETECTED 05/21/2016 0359   LABBARB NONE DETECTED 05/21/2016 0359     IMAGING I have personally reviewed the radiological images below and agree with the radiology interpretations.  Dg Chest 2 View 05/21/2016 No acute cardiopulmonary process seen.   Mr Brain Wo Contrast Mr Jodene Nam Head/brain Wo Cm 05/21/2016 1. Several subcentimeter scattered foci of acute/early subacute infarction are present within the right posterior frontal lobe inclusive of precentral gyrus and the right posterior temporal lobe. No associated hemorrhage or significant mass effect. 2. Stable background of chronic infarctions, moderate microvascular ischemic changes of white matter, and moderate brain parenchymal volume loss. 3. Mild diffuse paranasal sinus disease. Trace right mastoid effusion. 4. Patent circle of Willis without evidence for high-grade stenosis, large vessel occlusion, or aneurysm.   Ct Head Code  Stroke W/o Cm 05/20/2016 1. No acute finding. 2. Remote right frontal infarcts. Associated sulcal calcification suggesting remote calcified plaque embolus.   CUS - Right 60-79% ICA stenosis, low end of scale. Left 1-39% ICA stenosis, high end of scale. Antegrade vertebral flow.  LE venous duplex - No evidence of DVT or baker's cyst.  TTE pending   PHYSICAL EXAM  Temp:  [97.5 F (36.4 C)-98.6 F (37 C)] 98.6 F (37 C) (03/27 0657) Pulse Rate:  [63-79] 70 (03/27 0900) Resp:  [17-24] 18 (03/27 0900) BP: (160-210)/(52-99) 160/73 (03/27 0900) SpO2:  [94 %-99 %] 96 % (03/27  0900) Weight:  [196 lb 4.8 oz (89 kg)] 196 lb 4.8 oz (89 kg) (03/27 0100)  General - Well nourished, well developed, in no apparent distress.  Ophthalmologic - Fundi not visualized due to eye movement.  Cardiovascular - Regular rate and rhythm.  Mental Status -  Level of arousal and orientation to time, place, and person were intact. Language including expression, naming, repetition, comprehension was assessed and found intact. Fund of Knowledge was assessed and was intact.  Cranial Nerves II - XII - II - Visual field intact OU. III, IV, VI - Extraocular movements intact. V - Facial sensation intact bilaterally. VII - mild left nasolabial fold flattening. VIII - Hearing & vestibular intact bilaterally. X - Palate elevates symmetrically. XI - Chin turning & shoulder shrug intact bilaterally. XII - Tongue protrusion intact.  Motor Strength - The patient's strength was normal in all extremities and pronator drift was absent.  Bulk was normal and fasciculations were absent.   Motor Tone - Muscle tone was assessed at the neck and appendages and was normal.  Reflexes - The patient's reflexes were 1+ in all extremities and he had no pathological reflexes.  Sensory - Light touch, temperature/pinprick were assessed and were symmetrical.    Coordination - The patient had normal movements in the hands and feet with no ataxia or dysmetria.  Tremor was absent.  Gait and Station - deferred.   ASSESSMENT/PLAN [name] is a 81 y.o. male with history of HTN, HLD, DB, CAD, thyroid disease presenting with hoarseness and slurred speech. He did not receive IV t-PA due to being out of the time window.   Stroke:  right MCA scattered small to punctate infarcts embolic secondary to large vessel source from right ICA stenosis  Resultant  Mild left facial droop  Code Stroke CT no acute finding. Old R frontal infarcts. Sulcal calcification  MRI  R posterior frontal and temporal love  infarcts.   MRA b/l cavernous ICA athero   Carotid Doppler  Right ICA 60-79% stenosis, progressed from 04/2015 which was 40-59% stenosis  LE venous doppler no DVT  2D Echo  pending  Loop interrogation pending  Recommend  VVS consultation for consideration of right CEA.  LDL UTC due to high TG 749  UDS negative  HgbA1c pending  Heparin 5000 units sq tid for VTE prophylaxis  Diet heart healthy/carb modified Room service appropriate? Yes; Fluid consistency: Thin  aspirin 325 mg daily prior to admission, now on aspirin 325 mg daily. Recommend DAPT for 3 months and then plavix alone for further stroke prevention.  Patient counseled to be compliant with his antithrombotic medications  Ongoing aggressive stroke risk factor management  Therapy recommendations:  pending   Disposition:  pending   Symptomatic right ICA stenosis  Right ICA stenosis progressed from 04/2015 40-59% stenosis to 60-79% stenosis  Right MCA infarcts likely related  to right ICA stenosis  Loop recorder no afib  Recommend VVS consult for right CEA. Recommend to be done within 2 weeks according to guideline.  Hx of stroke  04/2015 - right MCA 2 punctate infarct - CUS right ICA 40-69% stenosis - EF normal - TG 649, A1C 8.8 - TEE small PFO - DVT neg - loop recorder placed - on ASA and zocor  Follows with Dr. Leonie Man in clinic  Hypertensive Urgency  BP elevated 210/95 on admission in setting of neurologic symptoms  BP Stable this am, 160s  Permissive hypertension (OK if < 220/120) but gradually normalize in 5-7 days  BP goal 130-150 before CEA, but normotensive after CEA  Hyperlipidemia  Home meds:  zocor 40, resumed in hospital  LDL UTC due to high TG, goal < 70  TG 749 from 699 in 04/2015  Will add zetia for high TG  Continue zocor and zetia at discharge  Diabetes  HgbA1c pending, goal < 7.0  Uncontrolled  Hyperglycemia during admission  SSI  On lantus  Other Stroke Risk  Factors  Advanced age  ETOH use, advised to drink no more than 2 drink(s) a day  Overweight, Body mass index is 29.85 kg/m., recommend weight loss, diet and exercise as appropriate   Coronary artery disease -MI, stent  Chronic diastolic CHF  Other Active Problems   AKI superimposed on CKD stage IV - Cre 4.50  Hypothyroidism   Follows with Dr. Leonie Man in clinic at [facility] day # 0  Rosalin Hawking, MD PhD Stroke Neurology 05/21/2016 1:15 PM    To contact Stroke Continuity provider, please refer to [email or web address] After hours, contact General Neurology

## 2016-05-21 NOTE — Progress Notes (Signed)
Inpatient Rehabilitation  Per PT request, patient was screened by Ellinore Merced for appropriateness for an Inpatient Acute Rehab consult.  At this time we are recommending an Inpatient Rehab consult.  MD text paged; please order if you are agreeable.    Kit Mollett, M.A., CCC/SLP Admission Coordinator  [facility]  Cell [phone]  

## 2016-05-21 NOTE — Consult Note (Signed)
[facility]  Patient name: Robert [name] MRN: [id number] DOB: Mar 24, 1935 Sex: male   REFERRING PROVIDER:    Dr. Erlinda Hong   REASON FOR CONSULT:    Carotid stenosis and CVA  HISTORY OF PRESENT ILLNESS:   Robert [name] is a 81 y.o. male, who Presented to the hospital on 05/20/2016 with slurred speech.  He was also felt to have a left facial droop.  The patient does have a history of a prior stroke with similar symptoms.  A noncontrast CT scan was negative for acute pathology.  He was not given TPA.  Carotid Doppler studies show 60-79% right carotid stenosis.  His MRI was positive for right-sided infarct.  He was hypertensive on arrival with blood pressure of 210/95.  The patient suffers from diabetes which has not been well controlled.  He takes a statin for hypercholesterolemia.  He is medically managed for hypertension.  He also suffers from stage IV renal insufficiency.  He has been evaluated for dialysis access in the past.  PAST MEDICAL HISTORY    Past Medical History:  Diagnosis Date  . Allergy    year round  . Cataract   . Diabetes mellitus   . GERD (gastroesophageal reflux disease)   . Hearing deficit   . Hyperlipidemia   . Hypertension   . Myocardial infarction 2011  . Stroke (cerebrum) ([place])   . Thyroid disease    hypothyroid     FAMILY HISTORY   Family History  Problem Relation Age of Onset  . Dementia Maternal Grandfather     SOCIAL HISTORY:   Social History   Social History  . Marital status: Married    Spouse name: N/A  . Number of children: N/A  . Years of education: N/A   Occupational History  . Not on file.   Social History Main Topics  . Smoking status: Former Research scientist (life sciences)  . Smokeless tobacco: Never Used  . Alcohol use 1.2 oz/week    2 Glasses of wine per week  . Drug use: No  . Sexual activity: Not on file   Other Topics Concern  . Not on file   Social History Narrative    . No narrative on file    ALLERGIES:    Allergies  Allergen Reactions  . Lisinopril Other (See Comments)    "made me lose my voice"    CURRENT MEDICATIONS:    Current Facility-Administered Medications  Medication Dose Route Frequency Provider Last Rate Last Dose  . acetaminophen (TYLENOL) tablet 650 mg  650 mg Oral Q4H PRN Vianne Bulls, MD       Or  . acetaminophen (TYLENOL) solution 650 mg  650 mg Per Tube Q4H PRN Vianne Bulls, MD       Or  . acetaminophen (TYLENOL) suppository 650 mg  650 mg Rectal Q4H PRN Ilene Qua Opyd, MD      . albuterol (PROVENTIL) (2.5 MG/3ML) 0.083% nebulizer solution 3 mL  3 mL Inhalation Q6H PRN Ilene Qua Opyd, MD      . aspirin suppository 300 mg  300 mg Rectal Daily Vianne Bulls, MD       Or  . aspirin tablet 325 mg  325 mg Oral Daily Vianne Bulls, MD   325 mg at 05/21/16 0902  . [START ON 05/22/2016] calcitRIOL (ROCALTROL) capsule 0.25 mcg  0.25 mcg Oral Q M,W,F Ilene Qua Opyd, MD      . ezetimibe (ZETIA) tablet 10 mg  10 mg Oral Daily Rosalin Hawking, MD      . heparin injection 5,000 Units  5,000 Units Subcutaneous Q8H Vianne Bulls, MD   5,000 Units at 05/21/16 0158  . insulin aspart (novoLOG) injection 0-15 Units  0-15 Units Subcutaneous TID WC Geradine Girt, DO   3 Units at 05/21/16 1250  . insulin aspart (novoLOG) injection 0-5 Units  0-5 Units Subcutaneous QHS Jessica U Vann, DO      . insulin glargine (LANTUS) injection 35 Units  35 Units Subcutaneous QHS Vianne Bulls, MD   35 Units at 05/21/16 0156  . labetalol (NORMODYNE,TRANDATE) injection 5-10 mg  5-10 mg Intravenous Q2H PRN Vianne Bulls, MD      . levothyroxine (SYNTHROID, LEVOTHROID) tablet 50 mcg  50 mcg Oral QAC breakfast Vianne Bulls, MD   50 mcg at 05/21/16 0902  . multivitamin with minerals tablet 1 tablet  1 tablet Oral Daily Vianne Bulls, MD   1 tablet at 05/21/16 0902  . pantoprazole (PROTONIX) EC tablet 40 mg  40 mg Oral Daily Vianne Bulls, MD   40 mg at 05/21/16  0902  . senna-docusate (Senokot-S) tablet 1 tablet  1 tablet Oral QHS PRN Vianne Bulls, MD      . simvastatin (ZOCOR) tablet 40 mg  40 mg Oral q1800 Vianne Bulls, MD        REVIEW OF SYSTEMS:   [X]  denotes positive finding, [ ]  denotes negative finding Cardiac  Comments:  Chest pain or chest pressure:    Shortness of breath upon exertion:    Short of breath when lying flat:    Irregular heart rhythm:        Vascular    Pain in calf, thigh, or hip brought on by ambulation:    Pain in feet at night that wakes you up from your sleep:     Blood clot in your veins:    Leg swelling:         Pulmonary    Oxygen at home:    Productive cough:     Wheezing:         Neurologic    Sudden weakness in arms or legs:     Sudden numbness in arms or legs:     Sudden onset of difficulty speaking or slurred speech: x   Temporary loss of vision in one eye:     Problems with dizziness:         Gastrointestinal    Blood in stool:      Vomited blood:         Genitourinary    Burning when urinating:     Blood in urine:        Psychiatric    Major depression:         Hematologic    Bleeding problems:    Problems with blood clotting too easily:        Skin    Rashes or ulcers:        Constitutional    Fever or chills:     PHYSICAL EXAM:   Vitals:   05/21/16 0300 05/21/16 0500 05/21/16 0657 05/21/16 0900  BP: (!) 198/77 (!) 160/52 (!) 160/80 (!) 160/73  Pulse: 79 68 72 70  Resp: 20 20 20 18   Temp:   98.6 F (37 C)   TempSrc:   Oral   SpO2: 96% 96% 96% 96%  Weight:      Height:  GENERAL: The patient is a well-nourished male, in no acute distress. The vital signs are documented above. CARDIAC: There is a regular rate and rhythm.  PULMONARY: Nonlabored respirations ABDOMEN: Soft and non-tender with normal pitched bowel sounds.  MUSCULOSKELETAL: There are no major deformities or cyanosis. NEUROLOGIC: No focal weakness or paresthesias are detected. SKIN: There are  no ulcers or rashes noted. PSYCHIATRIC: The patient has a normal affect.  STUDIES:   MRI: 1. Several subcentimeter scattered foci of acute/early subacute infarction are present within the right posterior frontal lobe inclusive of precentral gyrus and the right posterior temporal lobe. No associated hemorrhage or significant mass effect. 2. Stable background of chronic infarctions, moderate microvascular ischemic changes of white matter, and moderate brain parenchymal volume loss. 3. Mild diffuse paranasal sinus disease. Trace right mastoid effusion. 4. Patent circle of Willis without evidence for high-grade stenosis, large vessel occlusion, or aneurysm. These results will be called to the ordering clinician or representative by the Radiologist Assistant, and communication documented in the PACS or zVision Dashboard.  Carotid duplex: Right 60-79% ICA stenosis.  Left 1-39 percent stenosis  ASSESSMENT and PLAN   Right brain stroke in the setting of moderate right carotid stenosis: I discussed treatment options with the patient including stenting versus endarterectomy.  Because of his age as well as his renal disease, I feel that he is probably not a good candidate for stenting.  Before proceeding with endarterectomy, I would like to get a CT scan of the neck to evaluate the distal extent of the plaque, as pedal with chronic renal disease are prone to have disease that goes up to the base of the skull.  Unfortunately this will not be able to be with contrast because of his renal disease.  I did discuss the details of the operation including the risks and benefits which include but are not limited to the risk of stroke, nerve injury, cardiopulmonary complications.  Further recommendations will be made once the CT scan has been performed.  Unfortunately because of scheduling, his operation will likely not be able to be performed until next week or the following week.  He would like to go to a  grandson's baseball game tomorrow which from my perspective would be acceptable.  I will speak with his daughter later on tonight and follow up tomorrow   [name], MD [facility] [phone] Pager [phone]

## 2016-05-21 NOTE — Progress Notes (Signed)
Arrived to 5M10 at 1am. Alert and oriented. Hard of hearing. Dtr at bedside. Tele applied. Educated on safety measure with verbalized understanding.

## 2016-05-21 NOTE — Progress Notes (Signed)
Pt out for testing during 11 am vital sign and neuro check.  Will continue to monitor.  Cori Razor, RN

## 2016-05-22 DIAGNOSIS — I10 Essential (primary) hypertension: Secondary | ICD-10-CM

## 2016-05-22 DIAGNOSIS — E119 Type 2 diabetes mellitus without complications: Secondary | ICD-10-CM

## 2016-05-22 DIAGNOSIS — G8194 Hemiplegia, unspecified affecting left nondominant side: Secondary | ICD-10-CM

## 2016-05-22 DIAGNOSIS — Z794 Long term (current) use of insulin: Secondary | ICD-10-CM

## 2016-05-22 DIAGNOSIS — R471 Dysarthria and anarthria: Secondary | ICD-10-CM

## 2016-05-22 DIAGNOSIS — N184 Chronic kidney disease, stage 4 (severe): Secondary | ICD-10-CM

## 2016-05-22 LAB — CBC
HCT: 34.1 % — ABNORMAL LOW (ref 39.0–52.0)
HEMOGLOBIN: 10.8 g/dL — AB (ref 13.0–17.0)
MCH: 27.6 pg (ref 26.0–34.0)
MCHC: 31.7 g/dL (ref 30.0–36.0)
MCV: 87.2 fL (ref 78.0–100.0)
Platelets: 150 10*3/uL (ref 150–400)
RBC: 3.91 MIL/uL — AB (ref 4.22–5.81)
RDW: 15 % (ref 11.5–15.5)
WBC: 6.6 10*3/uL (ref 4.0–10.5)

## 2016-05-22 LAB — RENAL FUNCTION PANEL
ANION GAP: 9 (ref 5–15)
Albumin: 2.6 g/dL — ABNORMAL LOW (ref 3.5–5.0)
BUN: 53 mg/dL — ABNORMAL HIGH (ref 6–20)
CALCIUM: 8.2 mg/dL — AB (ref 8.9–10.3)
CHLORIDE: 110 mmol/L (ref 101–111)
CO2: 22 mmol/L (ref 22–32)
Creatinine, Ser: 3.98 mg/dL — ABNORMAL HIGH (ref 0.61–1.24)
GFR calc non Af Amer: 13 mL/min — ABNORMAL LOW (ref >60)
GFR, EST AFRICAN AMERICAN: 15 mL/min — AB (ref >60)
GLUCOSE: 119 mg/dL — AB (ref 65–99)
POTASSIUM: 4.1 mmol/L (ref 3.5–5.1)
Phosphorus: 4.2 mg/dL (ref 2.5–4.6)
SODIUM: 141 mmol/L (ref 135–145)

## 2016-05-22 LAB — GLUCOSE, CAPILLARY
GLUCOSE-CAPILLARY: 192 mg/dL — AB (ref 65–99)
GLUCOSE-CAPILLARY: 194 mg/dL — AB (ref 65–99)
Glucose-Capillary: 105 mg/dL — ABNORMAL HIGH (ref 65–99)

## 2016-05-22 LAB — VAS US CAROTID
LCCADDIAS: -14 cm/s
LCCADSYS: -50 cm/s
LCCAPDIAS: 11 cm/s
LEFT ECA DIAS: -10 cm/s
LEFT VERTEBRAL DIAS: 29 cm/s
LICADSYS: -91 cm/s
LICAPDIAS: -33 cm/s
Left CCA prox sys: 80 cm/s
Left ICA dist dias: -23 cm/s
Left ICA prox sys: -121 cm/s
RCCAPDIAS: 7 cm/s
RIGHT ECA DIAS: -63 cm/s
RIGHT VERTEBRAL DIAS: 9 cm/s
Right CCA prox sys: 58 cm/s
Right cca dist sys: -52 cm/s

## 2016-05-22 LAB — HEMOGLOBIN A1C
HEMOGLOBIN A1C: 8.3 % — AB (ref 4.8–5.6)
Mean Plasma Glucose: 192 mg/dL

## 2016-05-22 LAB — UREA NITROGEN, URINE: Urea Nitrogen, Ur: 415 mg/dL

## 2016-05-22 MED ORDER — FUROSEMIDE 40 MG PO TABS
40.0000 mg | ORAL_TABLET | Freq: Two times a day (BID) | ORAL | Status: DC
  Administered 2016-05-22 – 2016-05-24 (×4): 40 mg via ORAL
  Filled 2016-05-22 (×4): qty 1

## 2016-05-22 MED ORDER — METOPROLOL SUCCINATE ER 25 MG PO TB24
50.0000 mg | ORAL_TABLET | Freq: Two times a day (BID) | ORAL | Status: DC
  Administered 2016-05-22 – 2016-05-24 (×5): 50 mg via ORAL
  Filled 2016-05-22 (×6): qty 2

## 2016-05-22 NOTE — Progress Notes (Addendum)
PROGRESS NOTE    Robert [name]  PFX:[id number] DOB: 06-22-1935 DOA: 05/20/2016 PCP: Geoffery Lyons, MD   Outpatient Specialists:     Brief Narrative:  Robert [name] is a 81 y.o. male with medical history significant for hypertension, insulin-dependent diabetes mellitus, chronic diastolic CHF, history of cryptogenic CVA, and chronic kidney disease stage IV who presents to the emergency department with slurred speech, difficulty eating, and left facial droop. Patient reports that he was having an uneventful day this morning, and was doing some work on a computer at home in the afternoon when he decided to take a nap at approximately 3 PM. He reports waking at approximately 5:45 PM with a sensation of something stuck in his throat that he cannot clear. He then noted his speech to be slurred. He denied any associated chest pain or palpitations and denies any headache, change in vision or hearing, or focal numbness or weakness. There been no recent fall or trauma and he denies use of illicit substances or alcohol. He reports similar symptoms at time of his prior stroke. Patient reports going to a restaurant right after noticing the symptoms, had some difficulties swallowing and noted that the kept biting his tongue. He then went to visit a grandchild, but was noted by his daughter to have slurred speech and left facial droop and was taken to the ED instead.   Assessment & Plan:   Principal Problem:   Dysarthria Active Problems:   Diabetes mellitus, type II, insulin dependent (HCC)   Anemia   Essential hypertension   GERD   History of stroke   Hypertensive urgency   Chronic kidney disease (CKD), stage IV (severe) (HCC)   AKI (acute kidney injury) (HCC)   Thrombocytopenia (HCC)   Chronic diastolic CHF (congestive heart failure) (HCC)   Carotid stenosis, right   +CVA  Due to medications non-compliance - Neurology is consulting and much appreciated  - tPA not given due to low stroke  score   -MRI/MRA + for CVA - carotid HG:DJMEQ 60-79% ICA stenosis-- vascular consult- plan for outpatient procedure - echo: Normal LV systolic function; grade 1 diastolic dysfunction with   elevated LV filling pressure; severe MAC; trace MR and TR.  -A1c 8.3- will need medication adjustments -fasting lipids: triglycerides 759 - PT, OT, and SLP evals -- CIR-- pending acceptance - DAPT- ASA/plavix x 3 months then plavix alone -patient does not take medications on a daily basis  Hypertension with hypertensive urgency  - BP 210/95 on arrival - Managed at home with Toprol and hydralazine, currently held  - Labetalol IVP's prn SBP >210, or DBP >110;  -bring BP back to goal in coming days after permissive HTN period over  AKI superimposed on CKD stage IV  - SCr is 4.51, up from apparent baseline of ~3.2  -nephrology consult appreciated- Cr improved  Chronic diastolic CHF  - TTE (6/83/41) with EF 96-22%, grade 1 diastolic dysfunction, moderate LVH  - Managed at home with Lasix 40 mg BID and metoprolol; these are held currently  - Follow daily wts and strict I/O's, daily chem panel    Insulin-dependent DM  - A1c was 8.8% in March 2017 now 8.3 - Managed at home with Humalog pump, 46 units basal, and 36 units bolus per day  - Start Lantus 35 units with SSI  Hypothyroidism  - continue Synthroid    DVT prophylaxis:  SQ Heparin  Code Status: Full Code   Family Communication: Daughter at bedside  Disposition Plan:  CIR?   Consultants:   Vascular  neurology     Subjective: Had some tingling around mouth this AM-- now resolved  Objective: Vitals:   05/22/16 0100 05/22/16 0519 05/22/16 0941 05/22/16 1453  BP: (!) 180/71 (!) 176/67 (!) 185/72 (!) 183/74  Pulse: 75 79 76 71  Resp: _0 Temp: 97.9 F (36.6 C) 97.9 F (36.6 C) 98.4 F (36.9 C) 98.1 F (36.7 C)  TempSrc: Oral Oral Oral Oral  SpO2: 95% 96% 96% 96%  Weight:  91.3 kg (201 lb 4.8 oz)      Height:        Intake/Output Summary (Last 24 hours) at 05/22/16 1618 Last data filed at 05/22/16 0941  Gross per 24 hour  Intake              320 ml  Output             1082 ml  Net             -762 ml   Filed Weights   05/21/16 0100 05/22/16 0519  Weight: 89 kg (196 lb 4.8 oz) 91.3 kg (201 lb 4.8 oz)    Examination:  General exam: Appears calm and comfortable - speech slurred Respiratory system: diminished, no wheezing Cardiovascular system: S1 & S2 heard, RRR. No JVD, murmurs, rubs, gallops or clicks. No pedal edema. Gastrointestinal system: Abdomen is nondistended, soft and nontender. No organomegaly or masses felt. Normal bowel sounds heard. Central nervous system: Alert and oriented. Moves all 4 ext      Data Reviewed: I have personally reviewed following labs and imaging studies  CBC:  Recent Labs Lab 05/20/16 2043 05/20/16 2100 05/22/16 0509  WBC 7.0  --  6.6  NEUTROABS 5.0  --   --   HGB 12.3* 11.9* 10.8*  HCT 37.3* 35.0* 34.1*  MCV 86.9  --  87.2  PLT 145*  --  998   Basic Metabolic Panel:  Recent Labs Lab 05/20/16 2043 05/20/16 2100 05/22/16 0509  NA 139 140 141  K 4.5 4.5 4.1  CL 107 105 110  CO2 21*  --  22  GLUCOSE 262* 264* 119*  BUN 53* 52* 53*  CREATININE 4.31* 4.50* 3.98*  CALCIUM 8.5*  --  8.2*  PHOS  --   --  4.2   GFR: Estimated Creatinine Clearance: 16.8 mL/min (A) (by C-G formula based on SCr of 3.98 mg/dL (H)). Liver Function Tests:  Recent Labs Lab 05/20/16 2043 05/22/16 0509  AST 19  --   ALT 19  --   ALKPHOS 84  --   BILITOT 0.4  --   PROT 5.7*  --   ALBUMIN 3.0* 2.6*   No results for input(s): LIPASE, AMYLASE in the last 168 hours. No results for input(s): AMMONIA in the last 168 hours. Coagulation Profile:  Recent Labs Lab 05/20/16 2043  INR 0.99   Cardiac Enzymes: No results for input(s): CKTOTAL, CKMB, CKMBINDEX, TROPONINI in the last 168 hours. BNP (last 3 results) No results for input(s): PROBNP  in the last 8760 hours. HbA1C:  Recent Labs  05/21/16 0352  HGBA1C 8.3*   CBG:  Recent Labs Lab 05/21/16 1147 05/21/16 1645 05/21/16 2053 05/22/16 0616 05/22/16 1133  GLUCAP 162* 176* 160* 105* 192*   Lipid Profile:  Recent Labs  05/21/16 0352  CHOL 143  HDL 22*  LDLCALC UNABLE TO CALCULATE IF TRIGLYCERIDE OVER 400 mg/dL  TRIG 749*  CHOLHDL 6.5   Thyroid  Function Tests: No results for input(s): TSH, T4TOTAL, FREET4, T3FREE, THYROIDAB in the last 72 hours. Anemia Panel: No results for input(s): VITAMINB12, FOLATE, FERRITIN, TIBC, IRON, RETICCTPCT in the last 72 hours. Urine analysis:    Component Value Date/Time   COLORURINE YELLOW 05/21/2016 [place] 05/21/2016 0359   LABSPEC 1.012 05/21/2016 0359   PHURINE 6.0 05/21/2016 0359   GLUCOSEU >=500 (A) 05/21/2016 0359   HGBUR NEGATIVE 05/21/2016 0359   BILIRUBINUR NEGATIVE 05/21/2016 0359   KETONESUR NEGATIVE 05/21/2016 0359   PROTEINUR >=300 (A) 05/21/2016 0359   UROBILINOGEN 0.2 06/19/2009 1333   NITRITE NEGATIVE 05/21/2016 0359   LEUKOCYTESUR NEGATIVE 05/21/2016 0359     )No results found for this or any previous visit (from the past 240 hour(s)).    Anti-infectives    None       Radiology Studies: Dg Chest 2 View  Result Date: 05/21/2016 CLINICAL DATA:  Acute onset of shortness of breath and dysarthria. Initial encounter. EXAM: CHEST  2 VIEW COMPARISON:  Chest radiograph performed 05/13/2015 FINDINGS: The lungs are well-aerated and clear. There is no evidence of focal opacification, pleural effusion or pneumothorax. The heart is normal in size; the mediastinal contour is within normal limits. A loop recorder is noted. No acute osseous abnormalities are seen. IMPRESSION: No acute cardiopulmonary process seen. Electronically Signed   By: Garald Balding M.D.   On: 05/21/2016 00:44   Ct Soft Tissue Neck Wo Contrast  Result Date: 05/21/2016 CLINICAL DATA:  Stroke, assess for carotid  plaque. History of RIGHT carotid stenosis. History of stage 4 chronic kidney disease. EXAM: CT NECK WITHOUT CONTRAST TECHNIQUE: Multidetector CT imaging of the neck was performed following the standard protocol without intravenous contrast. COMPARISON:  MRA neck May 14, 2015 FINDINGS: Mild motion degraded examination. PHARYNX AND LARYNX: Normal.  Widely patent airway. SALIVARY GLANDS: Normal. THYROID: Normal. LYMPH NODES: No lymphadenopathy by CT size criteria. VASCULAR: Calcific atherosclerosis effacing the RIGHT internal carotid artery origin, and nearly effacing the LEFT internal carotid artery origin. Focal calcific atherosclerosis of the bilateral mid V2 segments. LIMITED INTRACRANIAL: Punctate calcifications RIGHT temporal occipital lobe. VISUALIZED ORBITS: Normal. MASTOIDS AND VISUALIZED PARANASAL SINUSES: Mild paranasal sinus mucosal thickening, status post FESS. Mastoid air cells are well aerated, pneumatized petrous apices. SKELETON: Nonacute. Multilevel moderate facet arthropathy. Congenital canal narrowing on the basis of foreshortened pedicles. UPPER CHEST: Lung apices are clear, centrilobular emphysema incompletely characterized. 11 mm short access LEFT peritracheal lymph node, with additional subcentimeter pretracheal lymph node. Lymphadenopathy. OTHER: Calcified bilateral stylohyoid ligament. IMPRESSION: Severe calcific atherosclerosis of the internal carotid artery origins and suspected severe RIGHT greater than LEFT stenosis which would be better characterized on CTA neck if renal function improves. Calcific atherosclerosis and suspected stenosis mid intraforaminal vertebral artery's. Electronically Signed   By: Elon Alas M.D.   On: 05/21/2016 20:25   Mr Brain Wo Contrast  Result Date: 05/21/2016 CLINICAL DATA:  81 y/o  M; slurred speech and left facial droop. EXAM: MRI HEAD WITHOUT CONTRAST MRA HEAD WITHOUT CONTRAST TECHNIQUE: Multiplanar, multiecho pulse sequences of the brain and  surrounding structures were obtained without intravenous contrast. Angiographic images of the head were obtained using MRA technique without contrast. COMPARISON:  05/20/2016 CT of the head. MRI of the head dated 05/13/2015. MRA of the head dated 05/14/2015. FINDINGS: MRI HEAD FINDINGS Brain: Several small scattered cortical foci of low diffusion within the right posterior frontal lobe with involvement of precentral gyrus and additional subcentimeter focus within the right  posterior temporal lobe compatible with acute/ early subacute infarction. Stable biparietal and right frontal chronic infarctions. Stable background of moderate chronic microvascular ischemic changes of white matter and parenchymal volume loss of the brain. Stable punctate focus of susceptibility hypointensity within the left medial parietal lobe and the right lateral frontal lobe compatible with hemosiderin deposition of old microhemorrhage. No new abnormal susceptibility hypointensity to indicate intracranial hemorrhage. Stable ventricle size. No extra-axial collection. Vascular: As below. Skull and upper cervical spine: Normal marrow signal. Sinuses/Orbits: Small left maxillary sinus mucous retention cyst and mild diffuse paranasal sinus mucosal thickening. Postsurgical changes related to partial ethmoidectomy and maxillary antrostomy bilaterally. Trace right mastoid effusion. Orbits are unremarkable. Other: None. MRA HEAD FINDINGS Internal carotid arteries: Mild bilateral cavernous and paraclinoid segment lumen irregularity likely related to atherosclerosis is stable and without significant stenosis or aneurysm identified. Anterior cerebral arteries:  Patent. Middle cerebral arteries: Patent. Anterior communicating artery: Patent. Posterior communicating arteries: Not identified, likely hypoplastic or absent. Posterior cerebral arteries:  Patent. Basilar artery:  Patent. Vertebral arteries:  Patent.  Left dominant. No evidence of high-grade  stenosis, large vessel occlusion, or aneurysm. IMPRESSION: 1. Several subcentimeter scattered foci of acute/early subacute infarction are present within the right posterior frontal lobe inclusive of precentral gyrus and the right posterior temporal lobe. No associated hemorrhage or significant mass effect. 2. Stable background of chronic infarctions, moderate microvascular ischemic changes of white matter, and moderate brain parenchymal volume loss. 3. Mild diffuse paranasal sinus disease. Trace right mastoid effusion. 4. Patent circle of Willis without evidence for high-grade stenosis, large vessel occlusion, or aneurysm. These results will be called to the ordering clinician or representative by the Radiologist Assistant, and communication documented in the PACS or zVision Dashboard. Electronically Signed   By: Kristine Garbe M.D.   On: 05/21/2016 00:26   Mr Jodene Nam Head/brain BW Cm  Result Date: 05/21/2016 CLINICAL DATA:  81 y/o  M; slurred speech and left facial droop. EXAM: MRI HEAD WITHOUT CONTRAST MRA HEAD WITHOUT CONTRAST TECHNIQUE: Multiplanar, multiecho pulse sequences of the brain and surrounding structures were obtained without intravenous contrast. Angiographic images of the head were obtained using MRA technique without contrast. COMPARISON:  05/20/2016 CT of the head. MRI of the head dated 05/13/2015. MRA of the head dated 05/14/2015. FINDINGS: MRI HEAD FINDINGS Brain: Several small scattered cortical foci of low diffusion within the right posterior frontal lobe with involvement of precentral gyrus and additional subcentimeter focus within the right posterior temporal lobe compatible with acute/ early subacute infarction. Stable biparietal and right frontal chronic infarctions. Stable background of moderate chronic microvascular ischemic changes of white matter and parenchymal volume loss of the brain. Stable punctate focus of susceptibility hypointensity within the left medial parietal lobe  and the right lateral frontal lobe compatible with hemosiderin deposition of old microhemorrhage. No new abnormal susceptibility hypointensity to indicate intracranial hemorrhage. Stable ventricle size. No extra-axial collection. Vascular: As below. Skull and upper cervical spine: Normal marrow signal. Sinuses/Orbits: Small left maxillary sinus mucous retention cyst and mild diffuse paranasal sinus mucosal thickening. Postsurgical changes related to partial ethmoidectomy and maxillary antrostomy bilaterally. Trace right mastoid effusion. Orbits are unremarkable. Other: None. MRA HEAD FINDINGS Internal carotid arteries: Mild bilateral cavernous and paraclinoid segment lumen irregularity likely related to atherosclerosis is stable and without significant stenosis or aneurysm identified. Anterior cerebral arteries:  Patent. Middle cerebral arteries: Patent. Anterior communicating artery: Patent. Posterior communicating arteries: Not identified, likely hypoplastic or absent. Posterior cerebral arteries:  Patent. Basilar artery:  Patent. Vertebral arteries:  Patent.  Left dominant. No evidence of high-grade stenosis, large vessel occlusion, or aneurysm. IMPRESSION: 1. Several subcentimeter scattered foci of acute/early subacute infarction are present within the right posterior frontal lobe inclusive of precentral gyrus and the right posterior temporal lobe. No associated hemorrhage or significant mass effect. 2. Stable background of chronic infarctions, moderate microvascular ischemic changes of white matter, and moderate brain parenchymal volume loss. 3. Mild diffuse paranasal sinus disease. Trace right mastoid effusion. 4. Patent circle of Willis without evidence for high-grade stenosis, large vessel occlusion, or aneurysm. These results will be called to the ordering clinician or representative by the Radiologist Assistant, and communication documented in the PACS or zVision Dashboard. Electronically Signed   By:  Kristine Garbe M.D.   On: 05/21/2016 00:26   Ct Head Code Stroke W/o Cm  Result Date: 05/20/2016 CLINICAL DATA:  Code stroke. Left-sided facial droop and slurred speech. EXAM: CT HEAD WITHOUT CONTRAST TECHNIQUE: Contiguous axial images were obtained from the base of the skull through the vertex without intravenous contrast. COMPARISON:  05/13/2015 FINDINGS: Brain: No evidence of acute infarction, hemorrhage, hydrocephalus, extra-axial collection or mass lesion/mass effect. There is a moderate area of remote infarct in the high right frontal cortex and white matter. Smaller right frontal/parietal infarct. Vascular: Extensive calcified atherosclerotic plaque. No hyperdense vessel. There is asymmetric sulcal calcification on the right, stable from prior and associated with remote infarcts- suspected remote embolic calcified plaque. Skull: No acute or aggressive finding Sinuses/Orbits: No acute finding Other: Text page with results sent on 05/20/2016 at 8:32 pm to Dr. Cheral Marker. ASPECTS [facility] Stroke Program Early CT Score) Not scored in the setting of remote right frontal infarct. IMPRESSION: 1. No acute finding. 2. Remote right frontal infarcts. Associated sulcal calcification suggesting remote calcified plaque embolus. Electronically Signed   By: Monte Fantasia M.D.   On: 05/20/2016 20:36        Scheduled Meds: . aspirin  300 mg Rectal Daily   Or  . aspirin  325 mg Oral Daily  . calcitRIOL  0.25 mcg Oral Q M,W,F  . ezetimibe  10 mg Oral Daily  . heparin  5,000 Units Subcutaneous Q8H  . insulin aspart  0-15 Units Subcutaneous TID WC  . insulin aspart  0-5 Units Subcutaneous QHS  . insulin glargine  35 Units Subcutaneous QHS  . levothyroxine  50 mcg Oral QAC breakfast  . metoprolol succinate  50 mg Oral BID  . multivitamin with minerals  1 tablet Oral Daily  . pantoprazole  40 mg Oral Daily  . simvastatin  40 mg Oral q1800   Continuous Infusions:   LOS: 1 day    Time spent: 25  min    [place], DO [facility] Pager [phone]  If 7PM-7AM, please contact night-coverage [email or web address] Password [facility] 05/22/2016, 4:18 PM

## 2016-05-22 NOTE — Progress Notes (Signed)
    Subjective  - P  No acute events   Physical Exam:  Moving all 4 extremities Bites tongue, lip with eating pulm: non-labored breathing     I have reviewed his CT scan.  His plaque extends prettty high, but I think it is surgically accessible    Assessment/Plan:    Spoke with patient and daughter today.  Patient is being evaluated for rehab admission.  I discussed going to rehab and considering CEA in 2 weeks  Christo Hain, [place] 05/22/2016 5:23 PM --  Vitals:   05/22/16 1453 05/22/16 1711  BP: (!) 183/74 (!) 197/67  Pulse: 71 71  Resp: 20 18  Temp: 98.1 F (36.7 C) 98.8 F (37.1 C)    Intake/Output Summary (Last 24 hours) at 05/22/16 1723 Last data filed at 05/22/16 0941  Gross per 24 hour  Intake              320 ml  Output              780 ml  Net             -460 ml     Laboratory CBC    Component Value Date/Time   WBC 6.6 05/22/2016 0509   HGB 10.8 (L) 05/22/2016 0509   HCT 34.1 (L) 05/22/2016 0509   PLT 150 05/22/2016 0509    BMET    Component Value Date/Time   NA 141 05/22/2016 0509   NA 144 03/25/2016 1335   K 4.1 05/22/2016 0509   CL 110 05/22/2016 0509   CO2 22 05/22/2016 0509   GLUCOSE 119 (H) 05/22/2016 0509   BUN 53 (H) 05/22/2016 0509   BUN 60 (H) 03/25/2016 1335   CREATININE 3.98 (H) 05/22/2016 0509   CALCIUM 8.2 (L) 05/22/2016 0509   GFRNONAA 13 (L) 05/22/2016 0509   GFRAA 15 (L) 05/22/2016 0509    COAG Lab Results  Component Value Date   INR 0.99 05/20/2016   INR 0.98 05/13/2015   INR 1.08 08/19/2009   No results found for: PTT  Antibiotics Anti-infectives    None       V. Leia Alf, M.D. [facility] Office: [phone] Pager:  [phone]

## 2016-05-22 NOTE — Progress Notes (Signed)
STROKE TEAM PROGRESS NOTE   SUBJECTIVE (INTERVAL HISTORY) Daughter is at bedside. VVS Dr. Trula Slade saw pt and plan for CEA in 2 weeks. Pending CIR.   OBJECTIVE Temp:  [97.8 F (36.6 C)-98.8 F (37.1 C)] 98.8 F (37.1 C) (03/28 1711) Pulse Rate:  [67-79] 71 (03/28 1711) Cardiac Rhythm: Normal sinus rhythm (03/28 0807) Resp:  [18-20] 18 (03/28 1711) BP: (176-197)/(67-94) 197/67 (03/28 1711) SpO2:  [95 %-98 %] 98 % (03/28 1711) Weight:  [201 lb 4.8 oz (91.3 kg)] 201 lb 4.8 oz (91.3 kg) (03/28 0519)  CBC:   Recent Labs Lab 05/20/16 2043 05/20/16 2100 05/22/16 0509  WBC 7.0  --  6.6  NEUTROABS 5.0  --   --   HGB 12.3* 11.9* 10.8*  HCT 37.3* 35.0* 34.1*  MCV 86.9  --  87.2  PLT 145*  --  244    Basic Metabolic Panel:   Recent Labs Lab 05/20/16 2043 05/20/16 2100 05/22/16 0509  NA 139 140 141  K 4.5 4.5 4.1  CL 107 105 110  CO2 21*  --  22  GLUCOSE 262* 264* 119*  BUN 53* 52* 53*  CREATININE 4.31* 4.50* 3.98*  CALCIUM 8.5*  --  8.2*  PHOS  --   --  4.2    Lipid Panel:     Component Value Date/Time   CHOL 143 05/21/2016 0352   TRIG 749 (H) 05/21/2016 0352   HDL 22 (L) 05/21/2016 0352   CHOLHDL 6.5 05/21/2016 0352   VLDL UNABLE TO CALCULATE IF TRIGLYCERIDE OVER 400 mg/dL 05/21/2016 0352   LDLCALC UNABLE TO CALCULATE IF TRIGLYCERIDE OVER 400 mg/dL 05/21/2016 0352   HgbA1c:  Lab Results  Component Value Date   HGBA1C 8.3 (H) 05/21/2016   Urine Drug Screen:     Component Value Date/Time   LABOPIA NONE DETECTED 05/21/2016 0359   COCAINSCRNUR NONE DETECTED 05/21/2016 0359   LABBENZ NONE DETECTED 05/21/2016 0359   AMPHETMU NONE DETECTED 05/21/2016 0359   THCU NONE DETECTED 05/21/2016 0359   LABBARB NONE DETECTED 05/21/2016 0359     IMAGING I have personally reviewed the radiological images below and agree with the radiology interpretations.  Dg Chest 2 View 05/21/2016 No acute cardiopulmonary process seen.   Mr Brain Wo Contrast Mr Jodene Nam Head/brain Wo  Cm 05/21/2016 1. Several subcentimeter scattered foci of acute/early subacute infarction are present within the right posterior frontal lobe inclusive of precentral gyrus and the right posterior temporal lobe. No associated hemorrhage or significant mass effect. 2. Stable background of chronic infarctions, moderate microvascular ischemic changes of white matter, and moderate brain parenchymal volume loss. 3. Mild diffuse paranasal sinus disease. Trace right mastoid effusion. 4. Patent circle of Willis without evidence for high-grade stenosis, large vessel occlusion, or aneurysm.   Ct Head Code Stroke W/o Cm 05/20/2016 1. No acute finding. 2. Remote right frontal infarcts. Associated sulcal calcification suggesting remote calcified plaque embolus.   CUS - Right 60-79% ICA stenosis, low end of scale. Left 1-39% ICA stenosis, high end of scale. Antegrade vertebral flow.  LE venous duplex - No evidence of DVT or baker's cyst.  TTE Normal LV systolic function; grade 1 diastolic dysfunction with   elevated LV filling pressure; severe MAC; trace MR and TR.   PHYSICAL EXAM  Temp:  [97.8 F (36.6 C)-98.8 F (37.1 C)] 98.8 F (37.1 C) (03/28 1711) Pulse Rate:  [67-79] 71 (03/28 1711) Resp:  [18-20] 18 (03/28 1711) BP: (176-197)/(67-94) 197/67 (03/28 1711) SpO2:  [95 %-98 %]  98 % (03/28 1711) Weight:  [201 lb 4.8 oz (91.3 kg)] 201 lb 4.8 oz (91.3 kg) (03/28 0519)  General - Well nourished, well developed, in no apparent distress.  Ophthalmologic - Fundi not visualized due to eye movement.  Cardiovascular - Regular rate and rhythm.  Mental Status -  Level of arousal and orientation to time, place, and person were intact. Language including expression, naming, repetition, comprehension was assessed and found intact. Fund of Knowledge was assessed and was intact.  Cranial Nerves II - XII - II - Visual field intact OU. III, IV, VI - Extraocular movements intact. V - Facial sensation intact  bilaterally. VII - mild left nasolabial fold flattening. VIII - Hearing & vestibular intact bilaterally. X - Palate elevates symmetrically. XI - Chin turning & shoulder shrug intact bilaterally. XII - Tongue protrusion intact.  Motor Strength - The patient's strength was normal in all extremities and pronator drift was absent.  Bulk was normal and fasciculations were absent.   Motor Tone - Muscle tone was assessed at the neck and appendages and was normal.  Reflexes - The patient's reflexes were 1+ in all extremities and he had no pathological reflexes.  Sensory - Light touch, temperature/pinprick were assessed and were symmetrical.    Coordination - The patient had normal movements in the hands and feet with no ataxia or dysmetria.  Tremor was absent.  Gait and Station - deferred.   ASSESSMENT/PLAN [name] is a 81 y.o. male with history of HTN, HLD, DB, CAD, thyroid disease presenting with hoarseness and slurred speech. He did not receive IV t-PA due to being out of the time window.   Stroke:  right MCA scattered small to punctate infarcts embolic secondary to large vessel source from right ICA stenosis  Resultant  Mild left facial droop  Code Stroke CT no acute finding. Old R frontal infarcts. Sulcal calcification  MRI  R posterior frontal and temporal love infarcts.   MRA b/l cavernous ICA athero   Carotid Doppler  Right ICA 60-79% stenosis, progressed from 04/2015 which was 40-59% stenosis  LE venous doppler no DVT  2D Echo  EF 55-60%  Loop interrogation no afib  VVS consulted and plan for right CEA in 2 weeks.  LDL UTC due to high TG 749  UDS negative  HgbA1c 8.3  Heparin 5000 units sq tid for VTE prophylaxis Diet heart healthy/carb modified Room service appropriate? Yes; Fluid consistency: Thin  aspirin 325 mg daily prior to admission, now on aspirin 325 mg daily. Recommend DAPT for 3 months and then plavix alone for further stroke  prevention.  Patient counseled to be compliant with his antithrombotic medications  Ongoing aggressive stroke risk factor management  Therapy recommendations:  CIR  Disposition:  pending   Symptomatic right ICA stenosis  Right ICA stenosis progressed from 04/2015 40-59% stenosis to 60-79% stenosis  Right MCA infarcts likely related to right ICA stenosis  Loop recorder no afib  VVS consulted and plan for right CEA in 2 weeks.  Hx of stroke  04/2015 - right MCA 2 punctate infarct - CUS right ICA 40-69% stenosis - EF normal - TG 649, A1C 8.8 - TEE small PFO - DVT neg - loop recorder placed - on ASA and zocor  Follows with Dr. Leonie Man in clinic  Hypertensive Urgency  BP elevated 210/95 on admission in setting of neurologic symptoms  BP Stable this am, 160s  Permissive hypertension (OK if < 220/120) but gradually normalize in 5-7  days  BP goal 130-150 before CEA, but normotensive after CEA  Hyperlipidemia  Home meds:  zocor 40, resumed in hospital  LDL UTC due to high TG, goal < 70  TG 749 from 699 in 04/2015  Added zetia for high TG  Continue zocor and zetia at discharge  Diabetes  HgbA1c 8.3, goal < 7.0  Uncontrolled  Hyperglycemia during admission  SSI  On lantus  Close PCP follow up  Other Stroke Risk Factors  Advanced age  ETOH use, advised to drink no more than 2 drink(s) a day  Overweight, Body mass index is 30.61 kg/m., recommend weight loss, diet and exercise as appropriate   Coronary artery disease -MI, stent  Chronic diastolic CHF  Other Active Problems   AKI superimposed on CKD stage IV - Cre 4.50  Hypothyroidism   Hospital day # 1  Neurology will sign off. Please call with questions. Pt will follow up with Dr. Leonie Man at [facility] in about 6 weeks. Thanks for the consult.   Rosalin Hawking, MD PhD Stroke Neurology 05/22/2016 6:35 PM    To contact Stroke Continuity provider, please refer to [email or web address] After hours, contact General  Neurology

## 2016-05-22 NOTE — Progress Notes (Signed)
[facility] Rounding Note  Subjective/Interval History:  Rehab evaluation pending Metoprolol started back Renal function improved to creatinine of 3.98 (was 3.57 in Feb at [facility] so very close to previous baseluine)  Objective Vital signs in last 24 hours: Vitals:   05/21/16 2100 05/22/16 0100 05/22/16 0519 05/22/16 0941  BP: (!) 180/94 (!) 180/71 (!) 176/67 (!) 185/72  Pulse: 67 75 79 76  Resp:  18 18 20   Temp: 97.8 F (36.6 C) 97.9 F (36.6 C) 97.9 F (36.6 C) 98.4 F (36.9 C)  TempSrc: Oral Oral Oral Oral  SpO2: 97% 95% 96% 96%  Weight:   91.3 kg (201 lb 4.8 oz)   Height:       Weight change: 2.268 kg (5 lb)  Intake/Output Summary (Last 24 hours) at 05/22/16 1420 Last data filed at 05/22/16 0941  Gross per 24 hour  Intake              320 ml  Output             1082 ml  Net             -762 ml   Physical Exam:  Blood pressure (!) 185/72, pulse 76, temperature 98.4 F (36.9 C), temperature source Oral, resp. rate 20, height 5\' 8"  (1.727 m), weight 91.3 kg (201 lb 4.8 oz), SpO2 96 %.   Gen: Ruddy complected WM, lying in bed VS as noted Skin: no rash, cyanosis but very red faced Neck: ? Soft R carotid bruit Chest: Clear anteriorly Heart: Regular. S1S2 No S3. No murmur Abdomen: soft, no focal tenderness Ext: Trace pretibial edema Neuro: alert, Ox3   Recent Labs Lab 05/20/16 2043 05/20/16 2100 05/22/16 0509  NA 139 140 141  K 4.5 4.5 4.1  CL 107 105 110  CO2 21*  --  22  GLUCOSE 262* 264* 119*  BUN 53* 52* 53*  CREATININE 4.31* 4.50* 3.98*  CALCIUM 8.5*  --  8.2*  PHOS  --   --  4.2     Recent Labs Lab 05/20/16 2043 05/22/16 0509  AST 19  --   ALT 19  --   ALKPHOS 84  --   BILITOT 0.4  --   PROT 5.7*  --   ALBUMIN 3.0* 2.6*     Recent Labs Lab 05/20/16 2043 05/20/16 2100 05/22/16 0509  WBC 7.0  --  6.6  NEUTROABS 5.0  --   --   HGB 12.3* 11.9* 10.8*  HCT 37.3* 35.0* 34.1*  MCV 86.9  --  87.2  PLT 145*  --  150     Recent Labs Lab  05/21/16 1147 05/21/16 1645 05/21/16 2053 05/22/16 0616 05/22/16 1133  GLUCAP 162* 176* 160* 105* 192*    Studies/Results: Dg Chest 2 View  Result Date: 05/21/2016 CLINICAL DATA:  Acute onset of shortness of breath and dysarthria. Initial encounter. EXAM: CHEST  2 VIEW COMPARISON:  Chest radiograph performed 05/13/2015 FINDINGS: The lungs are well-aerated and clear. There is no evidence of focal opacification, pleural effusion or pneumothorax. The heart is normal in size; the mediastinal contour is within normal limits. A loop recorder is noted. No acute osseous abnormalities are seen. IMPRESSION: No acute cardiopulmonary process seen. Electronically Signed   By: Garald Balding M.D.   On: 05/21/2016 00:44   Ct Soft Tissue Neck Wo Contrast  Result Date: 05/21/2016 CLINICAL DATA:  Stroke, assess for carotid plaque. History of RIGHT carotid stenosis. History of stage 4 chronic kidney disease. EXAM:  CT NECK WITHOUT CONTRAST TECHNIQUE: Multidetector CT imaging of the neck was performed following the standard protocol without intravenous contrast. COMPARISON:  MRA neck May 14, 2015 FINDINGS: Mild motion degraded examination. PHARYNX AND LARYNX: Normal.  Widely patent airway. SALIVARY GLANDS: Normal. THYROID: Normal. LYMPH NODES: No lymphadenopathy by CT size criteria. VASCULAR: Calcific atherosclerosis effacing the RIGHT internal carotid artery origin, and nearly effacing the LEFT internal carotid artery origin. Focal calcific atherosclerosis of the bilateral mid V2 segments. LIMITED INTRACRANIAL: Punctate calcifications RIGHT temporal occipital lobe. VISUALIZED ORBITS: Normal. MASTOIDS AND VISUALIZED PARANASAL SINUSES: Mild paranasal sinus mucosal thickening, status post FESS. Mastoid air cells are well aerated, pneumatized petrous apices. SKELETON: Nonacute. Multilevel moderate facet arthropathy. Congenital canal narrowing on the basis of foreshortened pedicles. UPPER CHEST: Lung apices are clear,  centrilobular emphysema incompletely characterized. 11 mm short access LEFT peritracheal lymph node, with additional subcentimeter pretracheal lymph node. Lymphadenopathy. OTHER: Calcified bilateral stylohyoid ligament. IMPRESSION: Severe calcific atherosclerosis of the internal carotid artery origins and suspected severe RIGHT greater than LEFT stenosis which would be better characterized on CTA neck if renal function improves. Calcific atherosclerosis and suspected stenosis mid intraforaminal vertebral artery's. Electronically Signed   By: Elon Alas M.D.   On: 05/21/2016 20:25   Mr Brain Wo Contrast  Result Date: 05/21/2016 CLINICAL DATA:  81 y/o  M; slurred speech and left facial droop. EXAM: MRI HEAD WITHOUT CONTRAST MRA HEAD WITHOUT CONTRAST TECHNIQUE: Multiplanar, multiecho pulse sequences of the brain and surrounding structures were obtained without intravenous contrast. Angiographic images of the head were obtained using MRA technique without contrast. COMPARISON:  05/20/2016 CT of the head. MRI of the head dated 05/13/2015. MRA of the head dated 05/14/2015. FINDINGS: MRI HEAD FINDINGS Brain: Several small scattered cortical foci of low diffusion within the right posterior frontal lobe with involvement of precentral gyrus and additional subcentimeter focus within the right posterior temporal lobe compatible with acute/ early subacute infarction. Stable biparietal and right frontal chronic infarctions. Stable background of moderate chronic microvascular ischemic changes of white matter and parenchymal volume loss of the brain. Stable punctate focus of susceptibility hypointensity within the left medial parietal lobe and the right lateral frontal lobe compatible with hemosiderin deposition of old microhemorrhage. No new abnormal susceptibility hypointensity to indicate intracranial hemorrhage. Stable ventricle size. No extra-axial collection. Vascular: As below. Skull and upper cervical spine:  Normal marrow signal. Sinuses/Orbits: Small left maxillary sinus mucous retention cyst and mild diffuse paranasal sinus mucosal thickening. Postsurgical changes related to partial ethmoidectomy and maxillary antrostomy bilaterally. Trace right mastoid effusion. Orbits are unremarkable. Other: None. MRA HEAD FINDINGS Internal carotid arteries: Mild bilateral cavernous and paraclinoid segment lumen irregularity likely related to atherosclerosis is stable and without significant stenosis or aneurysm identified. Anterior cerebral arteries:  Patent. Middle cerebral arteries: Patent. Anterior communicating artery: Patent. Posterior communicating arteries: Not identified, likely hypoplastic or absent. Posterior cerebral arteries:  Patent. Basilar artery:  Patent. Vertebral arteries:  Patent.  Left dominant. No evidence of high-grade stenosis, large vessel occlusion, or aneurysm. IMPRESSION: 1. Several subcentimeter scattered foci of acute/early subacute infarction are present within the right posterior frontal lobe inclusive of precentral gyrus and the right posterior temporal lobe. No associated hemorrhage or significant mass effect. 2. Stable background of chronic infarctions, moderate microvascular ischemic changes of white matter, and moderate brain parenchymal volume loss. 3. Mild diffuse paranasal sinus disease. Trace right mastoid effusion. 4. Patent circle of Willis without evidence for high-grade stenosis, large vessel occlusion, or aneurysm. These results will  be called to the ordering clinician or representative by the Radiologist Assistant, and communication documented in the PACS or zVision Dashboard. Electronically Signed   By: Kristine Garbe M.D.   On: 05/21/2016 00:26   Mr Jodene Nam Head/brain DV Cm  Result Date: 05/21/2016 CLINICAL DATA:  81 y/o  M; slurred speech and left facial droop. EXAM: MRI HEAD WITHOUT CONTRAST MRA HEAD WITHOUT CONTRAST TECHNIQUE: Multiplanar, multiecho pulse sequences of  the brain and surrounding structures were obtained without intravenous contrast. Angiographic images of the head were obtained using MRA technique without contrast. COMPARISON:  05/20/2016 CT of the head. MRI of the head dated 05/13/2015. MRA of the head dated 05/14/2015. FINDINGS: MRI HEAD FINDINGS Brain: Several small scattered cortical foci of low diffusion within the right posterior frontal lobe with involvement of precentral gyrus and additional subcentimeter focus within the right posterior temporal lobe compatible with acute/ early subacute infarction. Stable biparietal and right frontal chronic infarctions. Stable background of moderate chronic microvascular ischemic changes of white matter and parenchymal volume loss of the brain. Stable punctate focus of susceptibility hypointensity within the left medial parietal lobe and the right lateral frontal lobe compatible with hemosiderin deposition of old microhemorrhage. No new abnormal susceptibility hypointensity to indicate intracranial hemorrhage. Stable ventricle size. No extra-axial collection. Vascular: As below. Skull and upper cervical spine: Normal marrow signal. Sinuses/Orbits: Small left maxillary sinus mucous retention cyst and mild diffuse paranasal sinus mucosal thickening. Postsurgical changes related to partial ethmoidectomy and maxillary antrostomy bilaterally. Trace right mastoid effusion. Orbits are unremarkable. Other: None. MRA HEAD FINDINGS Internal carotid arteries: Mild bilateral cavernous and paraclinoid segment lumen irregularity likely related to atherosclerosis is stable and without significant stenosis or aneurysm identified. Anterior cerebral arteries:  Patent. Middle cerebral arteries: Patent. Anterior communicating artery: Patent. Posterior communicating arteries: Not identified, likely hypoplastic or absent. Posterior cerebral arteries:  Patent. Basilar artery:  Patent. Vertebral arteries:  Patent.  Left dominant. No evidence of  high-grade stenosis, large vessel occlusion, or aneurysm. IMPRESSION: 1. Several subcentimeter scattered foci of acute/early subacute infarction are present within the right posterior frontal lobe inclusive of precentral gyrus and the right posterior temporal lobe. No associated hemorrhage or significant mass effect. 2. Stable background of chronic infarctions, moderate microvascular ischemic changes of white matter, and moderate brain parenchymal volume loss. 3. Mild diffuse paranasal sinus disease. Trace right mastoid effusion. 4. Patent circle of Willis without evidence for high-grade stenosis, large vessel occlusion, or aneurysm. These results will be called to the ordering clinician or representative by the Radiologist Assistant, and communication documented in the PACS or zVision Dashboard. Electronically Signed   By: Kristine Garbe M.D.   On: 05/21/2016 00:26   Ct Head Code Stroke W/o Cm  Result Date: 05/20/2016 CLINICAL DATA:  Code stroke. Left-sided facial droop and slurred speech. EXAM: CT HEAD WITHOUT CONTRAST TECHNIQUE: Contiguous axial images were obtained from the base of the skull through the vertex without intravenous contrast. COMPARISON:  05/13/2015 FINDINGS: Brain: No evidence of acute infarction, hemorrhage, hydrocephalus, extra-axial collection or mass lesion/mass effect. There is a moderate area of remote infarct in the high right frontal cortex and white matter. Smaller right frontal/parietal infarct. Vascular: Extensive calcified atherosclerotic plaque. No hyperdense vessel. There is asymmetric sulcal calcification on the right, stable from prior and associated with remote infarcts- suspected remote embolic calcified plaque. Skull: No acute or aggressive finding Sinuses/Orbits: No acute finding Other: Text page with results sent on 05/20/2016 at 8:32 pm to Dr. Cheral Marker. ASPECTS [facility] Stroke  Program Early CT Score) Not scored in the setting of remote right frontal infarct.  IMPRESSION: 1. No acute finding. 2. Remote right frontal infarcts. Associated sulcal calcification suggesting remote calcified plaque embolus. Electronically Signed   By: Monte Fantasia M.D.   On: 05/20/2016 20:36   Medications:  . aspirin  300 mg Rectal Daily   Or  . aspirin  325 mg Oral Daily  . calcitRIOL  0.25 mcg Oral Q M,W,F  . ezetimibe  10 mg Oral Daily  . heparin  5,000 Units Subcutaneous Q8H  . insulin aspart  0-15 Units Subcutaneous TID WC  . insulin aspart  0-5 Units Subcutaneous QHS  . insulin glargine  35 Units Subcutaneous QHS  . levothyroxine  50 mcg Oral QAC breakfast  . metoprolol succinate  50 mg Oral BID  . multivitamin with minerals  1 tablet Oral Daily  . pantoprazole  40 mg Oral Daily  . simvastatin  40 mg Oral q1800     Background: 81 y.o. year-old gentleman with PMH of HTN, DM, chronic dCHF, h/o prior stroke, HLD. Presented to the ED on 05/20/16 with slurred speech, transient L facial droop. Head CT was neg x for old R frontal strokes, Carotid US 60-79% R carotid stenosis, ECHO w/nl EF, Grade 1 DD. Pt being evaluated by Dr. Trula Slade for carotid endarterectomy. Very hypertensive on admission (210/95) and currently on no BP meds.  We are asked to see b/o CKD. Pt is followed by Dr. Mercy Moore, last seen 04/18/16 with creatinine at that time 3.57. He is on Aranesp for anemia (100 mcg Q4wks, last Hb 11.1 on 04/23/16), has mild secondary HPT (PTH 116 03/2016) on TIW calcitriol. Has been evaluated for access by VVS (Dr. Bridgett Larsson) for future R AVF but has not scheduled anything.  Pt's creatinine was noted to be 4.31 on admission, repeated 4.50. Not rechecked today. Has been given some slow IVF's as was felt dry on admission.  Assessment/Recommendations  1. R brain stroke with mod R carotid stenosis - undergoing eval for CEA. ASA/statin  presently .  2. CKD 4/5 (as of 03/2016) with AKI on CKD - currently GFR around 14 (was 15 late Feb w/creatinine 3.57 so really minimal worsening as  of 3/26) Today close to most recent baseline - has dropped to 3.98. On no kidney unfriendly meds, has not had excessive BP lowering (in fact BP's remain quite elevated).  Has had AVF evaluation and eventual plan is for R AVF so need to save R arm.  3. Anemia - on outpt Aranesp. Hb 11.1 at time of last injection, 12.3/11.9 on admission, 10.8 today. Aranesp last dosed on 04/24/16 so would be due for dose in next couple days. Since Hb has varied some (as high as 12.3 will hold off on redosing until clear decline.   4. Secondary HPT - continue calcitriol 5. HTN - per neuro "permissive HTN but gradually normalize in 5-7 days" with goal 130-150 prior to CEA. Home meds metoprolol 50 BID, hydralazine 100 TID, furosemide 40 BID - currently on just metoprolol 50 BID. Has small amt LE edema. Low threshold to resume lasix. 6. Chronic dCHF - lasix currently on hold, metoprolol was restarted. 7. HLD - continue meds 8. DM - per primary service 9. Hypothyroidism - usual levo  Jamal Maes, MD [place] Pager 05/22/2016, 2:26 PM

## 2016-05-23 LAB — GLUCOSE, CAPILLARY
GLUCOSE-CAPILLARY: 129 mg/dL — AB (ref 65–99)
GLUCOSE-CAPILLARY: 149 mg/dL — AB (ref 65–99)
GLUCOSE-CAPILLARY: 219 mg/dL — AB (ref 65–99)
Glucose-Capillary: 134 mg/dL — ABNORMAL HIGH (ref 65–99)
Glucose-Capillary: 112 mg/dL — ABNORMAL HIGH (ref 65–99)

## 2016-05-23 MED ORDER — HYDRALAZINE HCL 50 MG PO TABS
100.0000 mg | ORAL_TABLET | Freq: Three times a day (TID) | ORAL | Status: DC
  Administered 2016-05-23 – 2016-05-24 (×3): 100 mg via ORAL
  Filled 2016-05-23 (×3): qty 2

## 2016-05-23 MED ORDER — LORATADINE 10 MG PO TABS
10.0000 mg | ORAL_TABLET | Freq: Every day | ORAL | Status: DC
  Administered 2016-05-23 – 2016-05-24 (×2): 10 mg via ORAL
  Filled 2016-05-23 (×2): qty 1

## 2016-05-23 MED ORDER — OXYMETAZOLINE HCL 0.05 % NA SOLN
1.0000 | Freq: Two times a day (BID) | NASAL | Status: DC
  Administered 2016-05-23 – 2016-05-24 (×3): 1 via NASAL
  Filled 2016-05-23: qty 15

## 2016-05-23 NOTE — Progress Notes (Signed)
PROGRESS NOTE    Robert [name]  FOY:[id number] DOB: [birth year] DOA: 05/20/2016 PCP: Geoffery Lyons, MD   Outpatient Specialists:     Brief Narrative:  Robert [name] is a 81 y.o. male with medical history significant for hypertension, insulin-dependent diabetes mellitus, chronic diastolic CHF, history of cryptogenic CVA, and chronic kidney disease stage IV who presents to the emergency department with slurred speech, difficulty eating, and left facial droop. Patient reports that he was having an uneventful day this morning, and was doing some work on a computer at home in the afternoon when he decided to take a nap at approximately 3 PM. He reports waking at approximately 5:45 PM with a sensation of something stuck in his throat that he cannot clear. He then noted his speech to be slurred. He denied any associated chest pain or palpitations and denies any headache, change in vision or hearing, or focal numbness or weakness. There been no recent fall or trauma and he denies use of illicit substances or alcohol. He reports similar symptoms at time of his prior stroke. Patient reports going to a restaurant right after noticing the symptoms, had some difficulties swallowing and noted that the kept biting his tongue. He then went to visit a grandchild, but was noted by his daughter to have slurred speech and left facial droop and was taken to the ED instead.   Assessment & Plan:   Principal Problem:   Dysarthria Active Problems:   Diabetes mellitus, type II, insulin dependent (HCC)   Anemia   Essential hypertension   GERD   History of stroke   Hypertensive urgency   Chronic kidney disease (CKD), stage IV (severe) (HCC)   AKI (acute kidney injury) (HCC)   Thrombocytopenia (HCC)   Chronic diastolic CHF (congestive heart failure) ([place])   Carotid stenosis, right  Insurance denial from [facility].  Await call from them so I can complete a peer to peer. Patient needs close follow up and  adjustment of his blood sugar, BP medications and renal function.  He is at high risk for CVA again in the near future.  Needs a CEA.  Do not think this can be managed in a SNF or at home.  High risk of re-admission and worsening outcome.     +CVA  Due to medications non-compliance - Neurology is consulting and much appreciated  - tPA not given due to low stroke score   -MRI/MRA + for CVA - carotid VE:HMCNO 60-79% ICA stenosis-- vascular consult- plan for outpatient procedure of CEA in 2 weeks - echo: Normal LV systolic function; grade 1 diastolic dysfunction with   elevated LV filling pressure; severe MAC; trace MR and TR.  -A1c 8.3- will need medication adjustments -fasting lipids: triglycerides 759 - PT, OT, and SLP evals -- CIR - DAPT- ASA/plavix x 3 months then plavix alone -patient does not take medications on a daily basis  Hypertension with hypertensive urgency  - BP 210/95 on arrival - Managed at home with Toprol and hydralazine, currently held  - Labetalol IVP's prn SBP >210, or DBP >110;  -bring BP back to goal in coming days after permissive HTN period over  AKI superimposed on CKD stage IV  - SCr is 4.51, up from apparent baseline of ~3.2  -nephrology consult appreciated- Cr improved  Chronic diastolic CHF  - TTE (09/02/60) with EF 83-66%, grade 1 diastolic dysfunction, moderate LVH  - resume home meds - Follow daily wts and strict I/O's, daily chem panel  Insulin-dependent DM  - A1c was 8.8% in March 2017 now 8.3 - Managed at home with Humalog pump, 46 units basal, and 36 units bolus per day  - Start Lantus 35 units with SSI  Hypothyroidism  - continue Synthroid    DVT prophylaxis:  SQ Heparin  Code Status: Full Code   Family Communication: Daughter at bedside  Disposition Plan:  CIR--await to complete the appeal process   Consultants:   Vascular  neurology     Subjective: Sitting on side of bed No overnight  events  Objective: Vitals:   05/22/16 2209 05/23/16 0100 05/23/16 0454 05/23/16 1114  BP: (!) 178/80 (!) 160/68 (!) 184/77 (!) 173/77  Pulse: 69 66 64 63  Resp: _0 Temp: 98.9 F (37.2 C) 98.9 F (37.2 C) 98.6 F (37 C) 98.3 F (36.8 C)  TempSrc: Oral Oral Oral Oral  SpO2: 97% 93% 94% 96%  Weight:   87.6 kg (193 lb 1.6 oz)   Height:        Intake/Output Summary (Last 24 hours) at 05/23/16 1530 Last data filed at 05/23/16 0859  Gross per 24 hour  Intake              120 ml  Output                0 ml  Net              120 ml   Filed Weights   05/21/16 0100 05/22/16 0519 05/23/16 0454  Weight: 89 kg (196 lb 4.8 oz) 91.3 kg (201 lb 4.8 oz) 87.6 kg (193 lb 1.6 oz)    Examination:  General exam: Appears calm and comfortable - speech slurred Respiratory system: diminished, no wheezing Cardiovascular system: S1 & S2 heard, RRR. No JVD, murmurs, rubs, gallops or clicks. No pedal edema. Gastrointestinal system: Abdomen is nondistended, soft and nontender. No organomegaly or masses felt. Normal bowel sounds heard.       Data Reviewed: I have personally reviewed following labs and imaging studies  CBC:  Recent Labs Lab 05/20/16 2043 05/20/16 2100 05/22/16 0509  WBC 7.0  --  6.6  NEUTROABS 5.0  --   --   HGB 12.3* 11.9* 10.8*  HCT 37.3* 35.0* 34.1*  MCV 86.9  --  87.2  PLT 145*  --  341   Basic Metabolic Panel:  Recent Labs Lab 05/20/16 2043 05/20/16 2100 05/22/16 0509  NA 139 140 141  K 4.5 4.5 4.1  CL 107 105 110  CO2 21*  --  22  GLUCOSE 262* 264* 119*  BUN 53* 52* 53*  CREATININE 4.31* 4.50* 3.98*  CALCIUM 8.5*  --  8.2*  PHOS  --   --  4.2   GFR: Estimated Creatinine Clearance: 16.5 mL/min (A) (by C-G formula based on SCr of 3.98 mg/dL (H)). Liver Function Tests:  Recent Labs Lab 05/20/16 2043 05/22/16 0509  AST 19  --   ALT 19  --   ALKPHOS 84  --   BILITOT 0.4  --   PROT 5.7*  --   ALBUMIN 3.0* 2.6*   No results for  input(s): LIPASE, AMYLASE in the last 168 hours. No results for input(s): AMMONIA in the last 168 hours. Coagulation Profile:  Recent Labs Lab 05/20/16 2043  INR 0.99   Cardiac Enzymes: No results for input(s): CKTOTAL, CKMB, CKMBINDEX, TROPONINI in the last 168 hours. BNP (last 3 results) No results for input(s): PROBNP in the  last 8760 hours. HbA1C:  Recent Labs  05/21/16 0352  HGBA1C 8.3*   CBG:  Recent Labs Lab 05/22/16 1133 05/22/16 1624 05/22/16 2146 05/23/16 0625 05/23/16 1135  GLUCAP 192* 194* 129* 134* 149*   Lipid Profile:  Recent Labs  05/21/16 0352  CHOL 143  HDL 22*  LDLCALC UNABLE TO CALCULATE IF TRIGLYCERIDE OVER 400 mg/dL  TRIG 749*  CHOLHDL 6.5   Thyroid Function Tests: No results for input(s): TSH, T4TOTAL, FREET4, T3FREE, THYROIDAB in the last 72 hours. Anemia Panel: No results for input(s): VITAMINB12, FOLATE, FERRITIN, TIBC, IRON, RETICCTPCT in the last 72 hours. Urine analysis:    Component Value Date/Time   COLORURINE YELLOW 05/21/2016 [place] 05/21/2016 0359   LABSPEC 1.012 05/21/2016 0359   PHURINE 6.0 05/21/2016 0359   GLUCOSEU >=500 (A) 05/21/2016 0359   HGBUR NEGATIVE 05/21/2016 0359   BILIRUBINUR NEGATIVE 05/21/2016 0359   KETONESUR NEGATIVE 05/21/2016 0359   PROTEINUR >=300 (A) 05/21/2016 0359   UROBILINOGEN 0.2 06/19/2009 1333   NITRITE NEGATIVE 05/21/2016 0359   LEUKOCYTESUR NEGATIVE 05/21/2016 0359     )No results found for this or any previous visit (from the past 240 hour(s)).    Anti-infectives    None       Radiology Studies: Ct Soft Tissue Neck Wo Contrast  Result Date: 05/21/2016 CLINICAL DATA:  Stroke, assess for carotid plaque. History of RIGHT carotid stenosis. History of stage 4 chronic kidney disease. EXAM: CT NECK WITHOUT CONTRAST TECHNIQUE: Multidetector CT imaging of the neck was performed following the standard protocol without intravenous contrast. COMPARISON:  MRA neck May 14, 2015 FINDINGS: Mild motion degraded examination. PHARYNX AND LARYNX: Normal.  Widely patent airway. SALIVARY GLANDS: Normal. THYROID: Normal. LYMPH NODES: No lymphadenopathy by CT size criteria. VASCULAR: Calcific atherosclerosis effacing the RIGHT internal carotid artery origin, and nearly effacing the LEFT internal carotid artery origin. Focal calcific atherosclerosis of the bilateral mid V2 segments. LIMITED INTRACRANIAL: Punctate calcifications RIGHT temporal occipital lobe. VISUALIZED ORBITS: Normal. MASTOIDS AND VISUALIZED PARANASAL SINUSES: Mild paranasal sinus mucosal thickening, status post FESS. Mastoid air cells are well aerated, pneumatized petrous apices. SKELETON: Nonacute. Multilevel moderate facet arthropathy. Congenital canal narrowing on the basis of foreshortened pedicles. UPPER CHEST: Lung apices are clear, centrilobular emphysema incompletely characterized. 11 mm short access LEFT peritracheal lymph node, with additional subcentimeter pretracheal lymph node. Lymphadenopathy. OTHER: Calcified bilateral stylohyoid ligament. IMPRESSION: Severe calcific atherosclerosis of the internal carotid artery origins and suspected severe RIGHT greater than LEFT stenosis which would be better characterized on CTA neck if renal function improves. Calcific atherosclerosis and suspected stenosis mid intraforaminal vertebral artery's. Electronically Signed   By: Elon Alas M.D.   On: 05/21/2016 20:25        Scheduled Meds: . aspirin  300 mg Rectal Daily   Or  . aspirin  325 mg Oral Daily  . calcitRIOL  0.25 mcg Oral Q M,W,F  . ezetimibe  10 mg Oral Daily  . furosemide  40 mg Oral BID  . heparin  5,000 Units Subcutaneous Q8H  . insulin aspart  0-15 Units Subcutaneous TID WC  . insulin aspart  0-5 Units Subcutaneous QHS  . insulin glargine  35 Units Subcutaneous QHS  . levothyroxine  50 mcg Oral QAC breakfast  . loratadine  10 mg Oral Daily  . metoprolol succinate  50 mg Oral BID  .  multivitamin with minerals  1 tablet Oral Daily  . oxymetazoline  1 spray Each Nare BID  .  pantoprazole  40 mg Oral Daily  . simvastatin  40 mg Oral q1800   Continuous Infusions:   LOS: 2 days    Time spent: 25 min    [place], DO [facility] Pager [phone]  If 7PM-7AM, please contact night-coverage [email or web address] Password [email or web address] 05/23/2016, 3:30 PM

## 2016-05-23 NOTE — Progress Notes (Signed)
qPhysical Therapy Treatment Patient Details Name: Robert [name] MRN: [id number] DOB: January 20, 1936 Today's Date: 05/23/2016    History of Present Illness Robert [name] is a 81 y.o. male with medical history significant for hypertension, insulin-dependent diabetes mellitus, chronic diastolic CHF, history of cryptogenic CVA, and chronic kidney disease stage IV who presents to the emergency department with slurred speech, difficulty eating, and left facial droop. MRI revealed several scattered foci of acute/early subacute infarction in R posterior frontal lobe inclusive of precentral gyrus and R posterior temporal lobe.    PT Comments    Patient progressing with mobility improved L foot clearance and working this session on improved L side awareness.  Continue to feel pt safety risk due to limited awareness of deficits which impact gait, balance, ADL's and IADL's.  Appropriate for CIR level rehab at d/c.    Follow Up Recommendations  CIR     Equipment Recommendations  Other (comment) (tba)    Recommendations for Other Services       Precautions / Restrictions Precautions Precautions: Fall Precaution Comments: decreased L awareness, R visual field deficits    Mobility  Bed Mobility               General bed mobility comments: up in chair  Transfers Overall transfer level: Needs assistance Equipment used: None Transfers: Sit to/from Stand Sit to Stand: Min guard         General transfer comment: assist for balance  Ambulation/Gait Ambulation/Gait assistance: Min assist;Min guard Ambulation Distance (Feet): 170 Feet Assistive device: None       General Gait Details: no episodes of dragging L foot, noted continued decreased L awareness, so pt encouraged to stop and read signs on wall/doors on L side.  Very slow pace, when walking on L side, pt slower and with decreased L side protraction.  Also gave squeeze ball to patient to hold in L hand and encourage attention  on L   Stairs Stairs: Yes   Stair Management: Step to pattern;Forwards;Alternating pattern;One rail Right;One rail Left Number of Stairs: 2 General stair comments: switches sides with railing when turning around at top of stairs; step through to ascend, step to for descent with slow and careful stepping down with posterior bias and fear of falling forward  Wheelchair Mobility    Modified Rankin (Stroke Patients Only) Modified Rankin (Stroke Patients Only) Pre-Morbid Rankin Score: No significant disability Modified Rankin: Moderately severe disability     Balance Overall balance assessment: Needs assistance Sitting-balance support: No upper extremity supported;Feet supported Sitting balance-Leahy Scale: Good     Standing balance support: Bilateral upper extremity supported;No upper extremity supported;During functional activity Standing balance-Leahy Scale: Good Standing balance comment: standing balance activities as noted below               High Level Balance Comments: side stepping, forward high knees marching, toe walking, tandem gait and crossing over in front with UE support and min A            Cognition Arousal/Alertness: Awake/alert Behavior During Therapy: WFL for tasks assessed/performed Overall Cognitive Status: History of cognitive impairments - at baseline                                 General Comments: Pt demonstrates slow processing and decreased short-term memory at baseline. Family assists with medication management.      Exercises      General Comments  Pertinent Vitals/Pain Pain Assessment: No/denies pain    Home Living                      Prior Function            PT Goals (current goals can now be found in the care plan section) Progress towards PT goals: Progressing toward goals    Frequency    Min 4X/week      PT Plan Current plan remains appropriate    Co-evaluation              End of Session Equipment Utilized During Treatment: Gait belt Activity Tolerance: Patient tolerated treatment well;Other (comment) (some SOB noted, but HR in 70-80's with ambulation) Patient left: in chair;with call bell/phone within reach;with chair alarm set;with family/visitor present   PT Visit Diagnosis: Other abnormalities of gait and mobility (R26.89);Hemiplegia and hemiparesis Hemiplegia - Right/Left: Left Hemiplegia - dominant/non-dominant: Non-dominant Hemiplegia - caused by: Cerebral infarction     Time: 1135-1200 PT Time Calculation (min) (ACUTE ONLY): 25 min  Charges:  $Gait Training: 8-22 mins $Neuromuscular Re-education: 8-22 mins                    G CodesMagda Kiel, [place] [phone] 05/23/2016    Reginia Naas 05/23/2016, 12:31 PM

## 2016-05-23 NOTE — Care Management Note (Signed)
Case Management Note  Patient Details  Name: Robert [name] MRN: [id number] Date of Birth: 03/04/1935  Subjective/Objective:  Pt admitted with CVA. He is from home with spouse.                    Action/Plan: Recommendations are for CIR. CM following for d/c disposition.   Expected Discharge Date:                  Expected Discharge Plan:  [place]  In-House Referral:     Discharge planning Services     Post Acute Care Choice:    Choice offered to:     DME Arranged:    DME Agency:     HH Arranged:    [place] Agency:     Status of Service:  In process, will continue to follow  If discussed at Long Length of Stay Meetings, dates discussed:    Additional Comments:  Pollie Friar, RN 05/23/2016, 11:53 AM

## 2016-05-23 NOTE — Progress Notes (Signed)
Occupational Therapy Treatment Patient Details Name: SAVIR BLANKE MRN: [id number] DOB: 24-Feb-1936 Today's Date: 05/23/2016    History of present illness ADIEL ERNEY is a 81 y.o. male with medical history significant for hypertension, insulin-dependent diabetes mellitus, chronic diastolic CHF, history of cryptogenic CVA, and chronic kidney disease stage IV who presents to the emergency department with slurred speech, difficulty eating, and left facial droop. MRI revealed several scattered foci of acute/early subacute infarction in R posterior frontal lobe inclusive of precentral gyrus and R posterior temporal lobe.   OT comments  Pt making progress towards OT goals this session. Focused on BUE tasks to work on awareness and standing balance during grooming activities. Pt continues to benefit from skilled OT in the acute setting and will require CIR level therapy to maximize safety and function to return to PLOF. Next session to continue assessing visual deficits if Pt has glasses.  Follow Up Recommendations  CIR;Supervision/Assistance - 24 hour    Equipment Recommendations  Other (comment) (defer to next venue)    Recommendations for Other Services      Precautions / Restrictions Precautions Precautions: Fall Precaution Comments: decreased L awareness, R visual field deficits Restrictions Weight Bearing Restrictions: No       Mobility Bed Mobility Overal bed mobility: Needs Assistance Bed Mobility: Supine to Sit     Supine to sit: Min assist     General bed mobility comments: min A for trunk elevation from flat bed with no rails  Transfers Overall transfer level: Needs assistance Equipment used: Rolling walker (2 wheeled) Transfers: Sit to/from Stand Sit to Stand: Min guard         General transfer comment: assist for balance    Balance Overall balance assessment: Needs assistance Sitting-balance support: No upper extremity supported;Feet supported Sitting  balance-Leahy Scale: Good     Standing balance support: Bilateral upper extremity supported;No upper extremity supported;During functional activity Standing balance-Leahy Scale: Good Standing balance comment: Able to stand at sink without UE support to complete grooming tasks.               High Level Balance Comments: side stepping, forward high knees marching, toe walking, tandem gait and crossing over in front with UE support and min A           ADL either performed or assessed with clinical judgement   ADL Overall ADL's : Needs assistance/impaired     Grooming: Minimal assistance;Standing;Wash/dry hands;Oral care;Wash/dry face;Brushing hair Grooming Details (indicate cue type and reason): Min assist for B hand tasks. vc to stand closer to the sink for improved balance             Lower Body Dressing: Minimal assistance;Sit to/from stand Lower Body Dressing Details (indicate cue type and reason): to don/doff socks - Pt reports at baseline that he uses a chair to prop his foot up on Toilet Transfer: Min Lobbyist Details (indicate cue type and reason): BSC over toilet Toileting- Clothing Manipulation and Hygiene: Min guard;Sit to/from stand Toileting - Clothing Manipulation Details (indicate cue type and reason): min guard for balance during peri care     Functional mobility during ADLs: Min guard;Rolling walker General ADL Comments: Pt with decreased awareness of L sided deficits. He demonstrates significant difficulty with fine motor coordination, and finally he stated that it was harder for him on the left when he was trying to squeeze toothpaste out of the tube and couldnt.      Vision   Vision Assessment?:  Vision impaired- to be further tested in functional context Additional Comments: Pt still does not have glasses in his room, no further visual testing until they are present   Perception     Praxis      Cognition Arousal/Alertness:  Awake/alert Behavior During Therapy: WFL for tasks assessed/performed Overall Cognitive Status: History of cognitive impairments - at baseline                                 General Comments: Pt demonstrates slow processing and decreased short-term memory at baseline. Family assists with medication management.        Exercises     Shoulder Instructions       General Comments      Pertinent Vitals/ Pain       Pain Assessment: No/denies pain  Home Living                                          Prior Functioning/Environment              Frequency  Min 2X/week        Progress Toward Goals  OT Goals(current goals can now be found in the care plan section)  Progress towards OT goals: Progressing toward goals  Acute Rehab OT Goals Patient Stated Goal: to get more independent at rehab OT Goal Formulation: With patient Time For Goal Achievement: 06/04/16 Potential to Achieve Goals: Good  Plan Discharge plan remains appropriate    Co-evaluation                 End of Session Equipment Utilized During Treatment: Gait belt;Rolling walker  OT Visit Diagnosis: Unsteadiness on feet (R26.81);Ataxia, unspecified (R27.0);Hemiplegia and hemiparesis Hemiplegia - Right/Left: Left Hemiplegia - dominant/non-dominant: Non-Dominant Hemiplegia - caused by: Cerebral infarction   Activity Tolerance Patient tolerated treatment well   Patient Left in chair;with call bell/phone within reach;with chair alarm set   Nurse Communication Mobility status        Time: 3007-6226 OT Time Calculation (min): 29 min  Charges: OT General Charges $OT Visit: 1 Procedure OT Treatments $Self Care/Home Management : 23-37 mins  Hulda Humphrey OTR/L [place] 05/23/2016, 3:21 PM

## 2016-05-23 NOTE — Progress Notes (Signed)
Rehab admissions - I have received a denial from [facility] for acute inpatient rehab admission.  The medical director says patient does not need to be monitored by a rehab MD daily and that patient needs can be met at a lower level of care.  I have called daughter, Mickel Baas, and she would like to pursue appeal.  I spoke with Dr. Eliseo Squires and I have called [facility] back and requested a peer to peer appeal between Dr. Eliseo Squires and the Psychologist, occupational.  I will follow up as needed.  Call me for questions.  #[phone]

## 2016-05-23 NOTE — Progress Notes (Signed)
Rehab admissions - I met with patient, his wife and his dtr in law at the bedside.  I gave them rehab booklets and explained inpatient rehab.  I have called and opened the case with [facility] medicare and I am waiting to hear back from insurance case manager.  I will update all once I hear back from insurance carrier.  If we get approval, I do have rehab beds available today.  Call me for questions.  #[phone]

## 2016-05-23 NOTE — Progress Notes (Signed)
Pt received denial from [facility] Medicare for CIR. Dr Eliseo Squires to do a Peer to Peer. CM spoke to the patient and his daughter Mickel Baas) about plans if patient is unable to go to CIR. They are agreeable to SNF rehab. Their first choice is [facility] and then [facility] Information given to CSW. CM continuing to follow for d/c disposition.

## 2016-05-24 DIAGNOSIS — I6521 Occlusion and stenosis of right carotid artery: Secondary | ICD-10-CM

## 2016-05-24 LAB — LIPID PANEL
CHOLESTEROL: 135 mg/dL (ref 0–200)
HDL: 26 mg/dL — ABNORMAL LOW (ref >40)
LDL Cholesterol: 36 mg/dL (ref 0–99)
TRIGLYCERIDES: 367 mg/dL — AB (ref <150)
Total CHOL/HDL Ratio: 5.2 RATIO
VLDL: 73 mg/dL — AB (ref 0–40)

## 2016-05-24 LAB — RENAL FUNCTION PANEL
ALBUMIN: 2.7 g/dL — AB (ref 3.5–5.0)
ANION GAP: 10 (ref 5–15)
BUN: 57 mg/dL — ABNORMAL HIGH (ref 6–20)
CO2: 24 mmol/L (ref 22–32)
CREATININE: 4.25 mg/dL — AB (ref 0.61–1.24)
Calcium: 8.8 mg/dL — ABNORMAL LOW (ref 8.9–10.3)
Chloride: 106 mmol/L (ref 101–111)
GFR, EST AFRICAN AMERICAN: 14 mL/min — AB (ref >60)
GFR, EST NON AFRICAN AMERICAN: 12 mL/min — AB (ref >60)
Glucose, Bld: 101 mg/dL — ABNORMAL HIGH (ref 65–99)
PHOSPHORUS: 4.2 mg/dL (ref 2.5–4.6)
Potassium: 4.1 mmol/L (ref 3.5–5.1)
SODIUM: 140 mmol/L (ref 135–145)

## 2016-05-24 LAB — CBC
HCT: 34.1 % — ABNORMAL LOW (ref 39.0–52.0)
HEMOGLOBIN: 11.1 g/dL — AB (ref 13.0–17.0)
MCH: 28.5 pg (ref 26.0–34.0)
MCHC: 32.6 g/dL (ref 30.0–36.0)
MCV: 87.4 fL (ref 78.0–100.0)
PLATELETS: 136 10*3/uL — AB (ref 150–400)
RBC: 3.9 MIL/uL — AB (ref 4.22–5.81)
RDW: 15.2 % (ref 11.5–15.5)
WBC: 7.2 10*3/uL (ref 4.0–10.5)

## 2016-05-24 LAB — GLUCOSE, CAPILLARY
GLUCOSE-CAPILLARY: 77 mg/dL (ref 65–99)
Glucose-Capillary: 192 mg/dL — ABNORMAL HIGH (ref 65–99)

## 2016-05-24 MED ORDER — INSULIN GLARGINE 100 UNIT/ML ~~LOC~~ SOLN: 35.0000 [IU] | Freq: Every day | SUBCUTANEOUS | 11 refills | Status: DC

## 2016-05-24 MED ORDER — INSULIN ASPART 100 UNIT/ML ~~LOC~~ SOLN: SUBCUTANEOUS | 11 refills | Status: DC

## 2016-05-24 MED ORDER — CLOPIDOGREL BISULFATE 75 MG PO TABS
75.0000 mg | ORAL_TABLET | Freq: Every day | ORAL | 1 refills | Status: AC
Start: 2016-05-24 — End: ?

## 2016-05-24 MED ORDER — EZETIMIBE 10 MG PO TABS: 10.0000 mg | ORAL_TABLET | Freq: Every day | ORAL | 1 refills | Status: AC

## 2016-05-24 NOTE — NC FL2 (Signed)
[place] LEVEL OF CARE SCREENING TOOL     IDENTIFICATION  Patient Name: Robert [name] Birthdate: Nov 10, 1935 Sex: male Admission Date (Current Location): 05/20/2016  [facility] and [place] Number:  Herbalist and Address:  [facility], [place] [address], [place], [place] [address]      Provider Number: [id number]  Attending Physician Name and Address:  Reyne Dumas, MD  Relative Name and Phone Number:       Current Level of Care: Hospital Recommended Level of Care: [place] Prior Approval Number:    Date Approved/Denied:   PASRR Number: [id number]  Discharge Plan: SNF    Current Diagnoses: Patient Active Problem List   Diagnosis Date Noted  . Carotid stenosis, right 05/21/2016  . AKI (acute kidney injury) ([place]) 05/20/2016  . Dysarthria 05/20/2016  . Thrombocytopenia ([place]) 05/20/2016  . Chronic diastolic CHF (congestive heart failure) ([place]) 05/20/2016  . Slurred speech   . Chronic kidney disease (CKD), stage IV (severe) ([place]) 03/20/2016  . Stroke (cerebrum) ([place])   . History of stroke 05/13/2015  . Acute encephalopathy 05/13/2015  . Hypertensive urgency 05/13/2015  . Hyperlipidemia 05/13/2015  . Diabetes mellitus, type II, insulin dependent ([place]) 08/18/2009  . Anemia 08/18/2009  . Essential hypertension 08/18/2009  . ALLERGIC RHINITIS, SEASONAL 08/18/2009  . PARAPHARYNGEAL ABSCESS 08/18/2009  . GERD 08/18/2009  . HIATAL HERNIA 08/18/2009  . DIVERTICULOSIS, COLON 08/18/2009  . 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 ARTERIOVENOUS MALFORMATION, COLON 08/18/2009  . DYSPHAGIA 08/18/2009  . COLONIC POLYPS, HX OF 08/18/2009  . RENAL CALCULUS, HX OF 08/18/2009  . ESOPHAGEAL STRICTURE 06/21/2009    Orientation RESPIRATION BLADDER Height & Weight     Time, Situation, Place, Self  Normal Continent Weight: 182 lb 14.4 oz (83 kg) Height:  5\' 8"  (172.7 cm)  BEHAVIORAL SYMPTOMS/MOOD  NEUROLOGICAL BOWEL NUTRITION STATUS      Continent  (Please see d/c summary)  AMBULATORY STATUS COMMUNICATION OF NEEDS Skin   Limited Assist Verbally Normal                       Personal Care Assistance Level of Assistance  Bathing, Feeding, Dressing Bathing Assistance: Limited assistance Feeding assistance: Independent Dressing Assistance: Limited assistance     Functional Limitations Info  Sight, Hearing, Speech Sight Info: Adequate Hearing Info: Adequate Speech Info: Adequate    SPECIAL CARE FACTORS FREQUENCY  OT (By licensed OT), PT (By licensed PT)     PT Frequency: 4x OT Frequency: 4x            Contractures Contractures Info: Not present    Additional Factors Info  Code Status, Allergies Code Status Info: Full Code Allergies Info: Lisinopril           Current Medications (05/24/2016):  This is the current hospital active medication list Current Facility-Administered Medications  Medication Dose Route Frequency Provider Last Rate Last Dose  . acetaminophen (TYLENOL) tablet 650 mg  650 mg Oral Q4H PRN Vianne Bulls, MD       Or  . acetaminophen (TYLENOL) solution 650 mg  650 mg Per Tube Q4H PRN Vianne Bulls, MD       Or  . acetaminophen (TYLENOL) suppository 650 mg  650 mg Rectal Q4H PRN Ilene Qua Opyd, MD      . albuterol (PROVENTIL) (2.5 MG/3ML) 0.083% nebulizer solution 3 mL  3 mL Inhalation Q6H PRN Vianne Bulls, MD      .  aspirin suppository 300 mg  300 mg Rectal Daily Vianne Bulls, MD       Or  . aspirin tablet 325 mg  325 mg Oral Daily Vianne Bulls, MD   325 mg at 05/24/16 1008  . calcitRIOL (ROCALTROL) capsule 0.25 mcg  0.25 mcg Oral Q M,W,F Ilene Qua Opyd, MD   0.25 mcg at 05/24/16 1008  . ezetimibe (ZETIA) tablet 10 mg  10 mg Oral Daily Rosalin Hawking, MD   10 mg at 05/24/16 1008  . furosemide (LASIX) tablet 40 mg  40 mg Oral BID Geradine Girt, DO   40 mg at 05/24/16 1009  . heparin injection 5,000 Units  5,000 Units Subcutaneous Q8H  Vianne Bulls, MD   5,000 Units at 05/24/16 0608  . hydrALAZINE (APRESOLINE) tablet 100 mg  100 mg Oral TID Geradine Girt, DO   100 mg at 05/24/16 1008  . insulin aspart (novoLOG) injection 0-15 Units  0-15 Units Subcutaneous TID WC Geradine Girt, DO   5 Units at 05/23/16 1727  . insulin aspart (novoLOG) injection 0-5 Units  0-5 Units Subcutaneous QHS Jessica U Vann, DO      . insulin glargine (LANTUS) injection 35 Units  35 Units Subcutaneous QHS Vianne Bulls, MD   35 Units at 05/23/16 2253  . labetalol (NORMODYNE,TRANDATE) injection 5-10 mg  5-10 mg Intravenous Q2H PRN Vianne Bulls, MD      . levothyroxine (SYNTHROID, LEVOTHROID) tablet 50 mcg  50 mcg Oral QAC breakfast Vianne Bulls, MD   50 mcg at 05/24/16 1009  . loratadine (CLARITIN) tablet 10 mg  10 mg Oral Daily Geradine Girt, DO   10 mg at 05/24/16 1008  . metoprolol succinate (TOPROL-XL) 24 hr tablet 50 mg  50 mg Oral BID Geradine Girt, DO   50 mg at 05/24/16 1008  . multivitamin with minerals tablet 1 tablet  1 tablet Oral Daily Vianne Bulls, MD   1 tablet at 05/24/16 1008  . oxymetazoline (AFRIN) 0.05 % nasal spray 1 spray  1 spray Each Nare BID Geradine Girt, DO   1 spray at 05/24/16 1010  . pantoprazole (PROTONIX) EC tablet 40 mg  40 mg Oral Daily Vianne Bulls, MD   40 mg at 05/24/16 1008  . senna-docusate (Senokot-S) tablet 1 tablet  1 tablet Oral QHS PRN Vianne Bulls, MD      . simvastatin (ZOCOR) tablet 40 mg  40 mg Oral q1800 Vianne Bulls, MD   40 mg at 05/23/16 1728     Discharge Medications: Please see discharge summary for a list of discharge medications.  Relevant Imaging Results:  Relevant Lab Results:   Additional Information SSN: [id number]  Eileen Stanford, LCSW

## 2016-05-24 NOTE — Progress Notes (Signed)
Rehab admissions - I spoke with rehab MD, Dr. Naaman Plummer, today.  He feels patient is doing well now and it is unlikely that we could turn over the peer to peer appeal given patient is doing so well now.  Recommend SNF short term if needed.  Call me for questions.  #[phone]

## 2016-05-24 NOTE — Progress Notes (Signed)
Patient's daughter refused PTAR. Patient helped to car by nurse tech via wheelchair

## 2016-05-24 NOTE — Progress Notes (Signed)
PROGRESS NOTE    Robert [name]  JME:[id number] DOB: [birth year] DOA: 05/20/2016 PCP: Geoffery Lyons, MD   Outpatient Specialists:     Brief Narrative:  Robert [name] is a 81 y.o. male with medical history significant for hypertension, insulin-dependent diabetes mellitus, chronic diastolic CHF, history of cryptogenic CVA, and chronic kidney disease stage IV who presents to the emergency department with slurred speech, difficulty eating, and left facial droop. Patient reports that he was having an uneventful day this morning, and was doing some work on a computer at home in the afternoon when he decided to take a nap at approximately 3 PM. He reports waking at approximately 5:45 PM with a sensation of something stuck in his throat that he cannot clear. He then noted his speech to be slurred. He denied any associated chest pain or palpitations and denies any headache, change in vision or hearing, or focal numbness or weakness. There been no recent fall or trauma and he denies use of illicit substances or alcohol. He reports similar symptoms at time of his prior stroke. Patient reports going to a restaurant right after noticing the symptoms, had some difficulties swallowing and noted that the kept biting his tongue. He then went to visit a grandchild, but was noted by his daughter to have slurred speech and left facial droop and was taken to the ED instead.   Assessment & Plan:   Principal Problem:   Dysarthria Active Problems:   Diabetes mellitus, type II, insulin dependent (HCC)   Anemia   Essential hypertension   GERD   History of stroke   Hypertensive urgency   Chronic kidney disease (CKD), stage IV (severe) (HCC)   AKI (acute kidney injury) (HCC)   Thrombocytopenia (HCC)   Chronic diastolic CHF (congestive heart failure) (HCC)   Carotid stenosis, right         +CVA  Due to medications non-compliance - Neurology is consulting and much appreciated  - tPA not given due to  low stroke score   -MRI/MRA + for CVA  carotid IW:LNLGX 60-79% ICA stenosis-- vascular consult- [facility] Dr. Trula Slade saw pt and plan for CEA in 2 weeks   - echo: Normal LV systolic function; grade 1 diastolic dysfunction with   elevated LV filling pressure; severe MAC; trace MR and TR.  -A1c 8.3- will need medication adjustments -fasting lipids: triglycerides 759, repeat lipid panel - PT, OT, and SLP evals -- CIR refused patient, will need SNF - DAPT- ASA/plavix x 3 months then plavix alone -patient does not take medications on a daily basis  Hypertension with hypertensive urgency  - BP 210/95 on arrival - Labetalol IVP's prn SBP >210, or DBP >110;   Currently on Lasix 40 mg by mouth twice a day, hydralazine 100 mg by mouth 3 times a day, metoprolol 50 mg by mouth twice a day    AKI superimposed on CKD stage IV  - SCr is 4.51>4.25, up from apparent baseline of ~2.7-3.0 -nephrology consult appreciated- Cr improved Has had AVF evaluation and eventual plan is for R AVF so need to save R arm.  Chronic diastolic CHF  - TTE (04/07/92) with EF 17-40%, grade 1 diastolic dysfunction, moderate LVH  - resume home meds - Follow daily wts and strict I/O's, daily chem panel    Insulin-dependent DM  - A1c was 8.8% in March 2017 now 8.3 - Managed at home with Humalog pump, 46 units basal, and 36 units bolus per day  - Continue Lantus 35  units with SSI  Hypothyroidism  - continue Synthroid   Anemia- on outpt Aranesp. Hb 11.1 at time of last injection, 12.3/11.9 on admission,  DVT prophylaxis:  SQ Heparin  Code Status: Full Code   Family Communication: Daughter at bedside  Disposition Plan:  SNF   Consultants:   Vascular  neurology     Subjective: No complaints   Objective: Vitals:   05/23/16 2158 05/24/16 0122 05/24/16 0500 05/24/16 0547  BP: (!) 192/74 (!) 146/69  132/64  Pulse: 64 64  66  Resp: _0 Temp: 98.9 F (37.2 C) 97.6 F (36.4 C)  98 F (36.7 C)    TempSrc: Oral Oral  Oral  SpO2: 96% 96%  95%  Weight:   83 kg (182 lb 14.4 oz)   Height:       No intake or output data in the 24 hours ending 05/24/16 0959 Filed Weights   05/22/16 0519 05/23/16 0454 05/24/16 0500  Weight: 91.3 kg (201 lb 4.8 oz) 87.6 kg (193 lb 1.6 oz) 83 kg (182 lb 14.4 oz)    Examination:  General exam: Appears calm and comfortable - speech slurred Respiratory system: diminished, no wheezing Cardiovascular system: S1 & S2 heard, RRR. No JVD, murmurs, rubs, gallops or clicks. No pedal edema. Gastrointestinal system: Abdomen is nondistended, soft and nontender. No organomegaly or masses felt. Normal bowel sounds heard.       Data Reviewed: I have personally reviewed following labs and imaging studies  CBC:  Recent Labs Lab 05/20/16 2043 05/20/16 2100 05/22/16 0509 05/24/16 0338  WBC 7.0  --  6.6 7.2  NEUTROABS 5.0  --   --   --   HGB 12.3* 11.9* 10.8* 11.1*  HCT 37.3* 35.0* 34.1* 34.1*  MCV 86.9  --  87.2 87.4  PLT 145*  --  150 094*   Basic Metabolic Panel:  Recent Labs Lab 05/20/16 2043 05/20/16 2100 05/22/16 0509 05/24/16 0338  NA 139 140 141 140  K 4.5 4.5 4.1 4.1  CL 107 105 110 106  CO2 21*  --  22 24  GLUCOSE 262* 264* 119* 101*  BUN 53* 52* 53* 57*  CREATININE 4.31* 4.50* 3.98* 4.25*  CALCIUM 8.5*  --  8.2* 8.8*  PHOS  --   --  4.2 4.2   GFR: Estimated Creatinine Clearance: 15 mL/min (A) (by C-G formula based on SCr of 4.25 mg/dL (H)). Liver Function Tests:  Recent Labs Lab 05/20/16 2043 05/22/16 0509 05/24/16 0338  AST 19  --   --   ALT 19  --   --   ALKPHOS 84  --   --   BILITOT 0.4  --   --   PROT 5.7*  --   --   ALBUMIN 3.0* 2.6* 2.7*   No results for input(s): LIPASE, AMYLASE in the last 168 hours. No results for input(s): AMMONIA in the last 168 hours. Coagulation Profile:  Recent Labs Lab 05/20/16 2043  INR 0.99   Cardiac Enzymes: No results for input(s): CKTOTAL, CKMB, CKMBINDEX, TROPONINI in the  last 168 hours. BNP (last 3 results) No results for input(s): PROBNP in the last 8760 hours. HbA1C: No results for input(s): HGBA1C in the last 72 hours. CBG:  Recent Labs Lab 05/23/16 0625 05/23/16 1135 05/23/16 1648 05/23/16 2153 05/24/16 0640  GLUCAP 134* 149* 219* 112* 77   Lipid Profile: No results for input(s): CHOL, HDL, LDLCALC, TRIG, CHOLHDL, LDLDIRECT in the last 72 hours. Thyroid Function  Tests: No results for input(s): TSH, T4TOTAL, FREET4, T3FREE, THYROIDAB in the last 72 hours. Anemia Panel: No results for input(s): VITAMINB12, FOLATE, FERRITIN, TIBC, IRON, RETICCTPCT in the last 72 hours. Urine analysis:    Component Value Date/Time   COLORURINE YELLOW 05/21/2016 [place] 05/21/2016 0359   LABSPEC 1.012 05/21/2016 0359   PHURINE 6.0 05/21/2016 0359   GLUCOSEU >=500 (A) 05/21/2016 0359   HGBUR NEGATIVE 05/21/2016 0359   BILIRUBINUR NEGATIVE 05/21/2016 0359   KETONESUR NEGATIVE 05/21/2016 0359   PROTEINUR >=300 (A) 05/21/2016 0359   UROBILINOGEN 0.2 06/19/2009 1333   NITRITE NEGATIVE 05/21/2016 0359   LEUKOCYTESUR NEGATIVE 05/21/2016 0359     )No results found for this or any previous visit (from the past 240 hour(s)).    Anti-infectives    None       Radiology Studies: No results found.      Scheduled Meds: . aspirin  300 mg Rectal Daily   Or  . aspirin  325 mg Oral Daily  . calcitRIOL  0.25 mcg Oral Q M,W,F  . ezetimibe  10 mg Oral Daily  . furosemide  40 mg Oral BID  . heparin  5,000 Units Subcutaneous Q8H  . hydrALAZINE  100 mg Oral TID  . insulin aspart  0-15 Units Subcutaneous TID WC  . insulin aspart  0-5 Units Subcutaneous QHS  . insulin glargine  35 Units Subcutaneous QHS  . levothyroxine  50 mcg Oral QAC breakfast  . loratadine  10 mg Oral Daily  . metoprolol succinate  50 mg Oral BID  . multivitamin with minerals  1 tablet Oral Daily  . oxymetazoline  1 spray Each Nare BID  . pantoprazole  40 mg Oral  Daily  . simvastatin  40 mg Oral q1800   Continuous Infusions:   LOS: 3 days    Time spent: 25 min    Joe Tanney, DO [facility] Pager [phone]  If 7PM-7AM, please contact night-coverage [email or web address] Password [email or web address] 05/24/2016, 9:59 AM

## 2016-05-24 NOTE — Care Management Note (Signed)
Case Management Note  Patient Details  Name: Robert [name] MRN: [id number] Date of Birth: Apr 09, 1935  Subjective/Objective:                    Action/Plan: Plan is for patient to d/c to [facility] today. No further needs per CM.   Expected Discharge Date:  05/24/16               Expected Discharge Plan:  IP Rehab Facility  In-House Referral:  Clinical Social Work  Discharge planning Services  CM Consult  Post Acute Care Choice:    Choice offered to:     DME Arranged:    DME Agency:     HH Arranged:    [place] Agency:     Status of Service:  Completed, signed off  If discussed at [employer], dates discussed:    Additional Comments:  Pollie Friar, RN 05/24/2016, 12:53 PM

## 2016-05-24 NOTE — Discharge Summary (Signed)
Physician Discharge Summary  Robert [name] MRN: [id number] DOB/AGE: Nov 07, 1935 81 y.o.  PCP: Geoffery Lyons, MD   Admit date: 05/20/2016 Discharge date: 05/24/2016  Discharge Diagnoses:    Principal Problem:   Dysarthria Active Problems:   Diabetes mellitus, type II, insulin dependent (HCC)   Anemia   Essential hypertension   GERD   History of stroke   Hypertensive urgency   Chronic kidney disease (CKD), stage IV (severe) (HCC)   AKI (acute kidney injury) (HCC)   Thrombocytopenia (HCC)   Chronic diastolic CHF (congestive heart failure) (HCC)   Carotid stenosis, right    Follow-up recommendations Follow-up with PCP in 3-5 days , including all  additional recommended appointments as below Follow-up CBC, CMP in 3-5 days Pt will follow up with Dr. Leonie Man at [facility] in about 6 weeks Patient to follow-up with vascular surgery  Serafina Mitchell, MD for [facility]     Current Discharge Medication List    START taking these medications   Details  ezetimibe (ZETIA) 10 MG tablet Take 1 tablet (10 mg total) by mouth daily. Qty: 30 tablet, Refills: 1    insulin aspart (NOVOLOG) 100 UNIT/ML injection CBG < 70: implement hypoglycemia protocol  CBG 70 - 120: 0 units  CBG 121 - 150: 2 units  CBG 151 - 200: 3 units  CBG 201 - 250: 5 units  CBG 251 - 300: 8 units  CBG 301 - 350: 11 units  CBG 351 - 400: 15 units  CBG > 400 call MD and obtain STAT lab verification Qty: 10 mL, Refills: 11    insulin glargine (LANTUS) 100 UNIT/ML injection Inject 0.35 mLs (35 Units total) into the skin at bedtime. Qty: 10 mL, Refills: 11      CONTINUE these medications which have NOT CHANGED   Details  albuterol (PROVENTIL HFA;VENTOLIN HFA) 108 (90 Base) MCG/ACT inhaler Inhale 2 puffs into the lungs every 6 (six) hours as needed for wheezing or shortness of breath.    aspirin 325 MG EC tablet Take 325 mg by mouth daily.    calcitRIOL (ROCALTROL) 0.25 MCG capsule Take 0.25 mcg by mouth every Monday,  Wednesday, and Friday.    furosemide (LASIX) 40 MG tablet Take 40 mg by mouth 2 (two) times daily.    hydrALAZINE (APRESOLINE) 100 MG tablet Take 100 mg by mouth 3 (three) times daily.     levothyroxine (SYNTHROID, LEVOTHROID) 50 MCG tablet Take 50 mcg by mouth daily before breakfast.     metoprolol succinate (TOPROL-XL) 50 MG 24 hr tablet Take 50 mg by mouth 2 (two) times daily.     Multiple Vitamins-Minerals (MULTIVITAMIN WITH MINERALS) tablet Take 1 tablet by mouth daily.    omeprazole (PRILOSEC) 20 MG capsule Take 1 capsule by mouth Daily.    simvastatin (ZOCOR) 40 MG tablet Take 1 tablet (40 mg total) by mouth daily at 6 PM. Qty: 30 tablet, Refills: 0    plavix 75 mg daily     STOP taking these medications     Insulin Human (INSULIN PUMP) SOLN          Discharge Condition: *stable  Discharge Instructions Get Medicines reviewed and adjusted: Please take all your medications with you for your next visit with your Primary MD  Please request your Primary MD to go over all hospital tests and procedure/radiological results at the follow up, please ask your Primary MD to get all Hospital records sent to his/her office.  If you experience worsening of your  admission symptoms, develop shortness of breath, life threatening emergency, suicidal or homicidal thoughts you must seek medical attention immediately by calling 911 or calling your MD immediately if symptoms less severe.  You must read complete instructions/literature along with all the possible adverse reactions/side effects for all the Medicines you take and that have been prescribed to you. Take any new Medicines after you have completely understood and accpet all the possible adverse reactions/side effects.   Do not drive when taking Pain medications.   Do not take more than prescribed Pain, Sleep and Anxiety Medications  Special Instructions: If you have smoked or chewed Tobacco in the last 2 yrs please stop  smoking, stop any regular Alcohol and or any Recreational drug use.  Wear Seat belts while driving.  Please note  You were cared for by a hospitalist during your hospital stay. Once you are discharged, your primary care physician will handle any further medical issues. Please note that NO REFILLS for any discharge medications will be authorized once you are discharged, as it is imperative that you return to your primary care physician (or establish a relationship with a primary care physician if you do not have one) for your aftercare needs so that they can reassess your need for medications and monitor your lab values.  Discharge Instructions    Ambulatory referral to Neurology    Complete by:  As directed    Follow up with Dr. Leonie Man at [facility] in 2 months. Thanks.   Diet - low sodium heart healthy    Complete by:  As directed    Increase activity slowly    Complete by:  As directed        Allergies  Allergen Reactions  . Lisinopril Other (See Comments)    "made me lose my voice"      Disposition: snf   Consults:  neurology   Significant Diagnostic Studies:  Dg Chest 2 View  Result Date: 05/21/2016 CLINICAL DATA:  Acute onset of shortness of breath and dysarthria. Initial encounter. EXAM: CHEST  2 VIEW COMPARISON:  Chest radiograph performed 05/13/2015 FINDINGS: The lungs are well-aerated and clear. There is no evidence of focal opacification, pleural effusion or pneumothorax. The heart is normal in size; the mediastinal contour is within normal limits. A loop recorder is noted. No acute osseous abnormalities are seen. IMPRESSION: No acute cardiopulmonary process seen. Electronically Signed   By: Garald Balding M.D.   On: 05/21/2016 00:44   Ct Soft Tissue Neck Wo Contrast  Result Date: 05/21/2016 CLINICAL DATA:  Stroke, assess for carotid plaque. History of RIGHT carotid stenosis. History of stage 4 chronic kidney disease. EXAM: CT NECK WITHOUT CONTRAST TECHNIQUE:  Multidetector CT imaging of the neck was performed following the standard protocol without intravenous contrast. COMPARISON:  MRA neck May 14, 2015 FINDINGS: Mild motion degraded examination. PHARYNX AND LARYNX: Normal.  Widely patent airway. SALIVARY GLANDS: Normal. THYROID: Normal. LYMPH NODES: No lymphadenopathy by CT size criteria. VASCULAR: Calcific atherosclerosis effacing the RIGHT internal carotid artery origin, and nearly effacing the LEFT internal carotid artery origin. Focal calcific atherosclerosis of the bilateral mid V2 segments. LIMITED INTRACRANIAL: Punctate calcifications RIGHT temporal occipital lobe. VISUALIZED ORBITS: Normal. MASTOIDS AND VISUALIZED PARANASAL SINUSES: Mild paranasal sinus mucosal thickening, status post FESS. Mastoid air cells are well aerated, pneumatized petrous apices. SKELETON: Nonacute. Multilevel moderate facet arthropathy. Congenital canal narrowing on the basis of foreshortened pedicles. UPPER CHEST: Lung apices are clear, centrilobular emphysema incompletely characterized. 11 mm short access LEFT peritracheal  lymph node, with additional subcentimeter pretracheal lymph node. Lymphadenopathy. OTHER: Calcified bilateral stylohyoid ligament. IMPRESSION: Severe calcific atherosclerosis of the internal carotid artery origins and suspected severe RIGHT greater than LEFT stenosis which would be better characterized on CTA neck if renal function improves. Calcific atherosclerosis and suspected stenosis mid intraforaminal vertebral artery's. Electronically Signed   By: Elon Alas M.D.   On: 05/21/2016 20:25   Mr Brain Wo Contrast  Result Date: 05/21/2016 CLINICAL DATA:  81 y/o  M; slurred speech and left facial droop. EXAM: MRI HEAD WITHOUT CONTRAST MRA HEAD WITHOUT CONTRAST TECHNIQUE: Multiplanar, multiecho pulse sequences of the brain and surrounding structures were obtained without intravenous contrast. Angiographic images of the head were obtained using MRA  technique without contrast. COMPARISON:  05/20/2016 CT of the head. MRI of the head dated 05/13/2015. MRA of the head dated 05/14/2015. FINDINGS: MRI HEAD FINDINGS Brain: Several small scattered cortical foci of low diffusion within the right posterior frontal lobe with involvement of precentral gyrus and additional subcentimeter focus within the right posterior temporal lobe compatible with acute/ early subacute infarction. Stable biparietal and right frontal chronic infarctions. Stable background of moderate chronic microvascular ischemic changes of white matter and parenchymal volume loss of the brain. Stable punctate focus of susceptibility hypointensity within the left medial parietal lobe and the right lateral frontal lobe compatible with hemosiderin deposition of old microhemorrhage. No new abnormal susceptibility hypointensity to indicate intracranial hemorrhage. Stable ventricle size. No extra-axial collection. Vascular: As below. Skull and upper cervical spine: Normal marrow signal. Sinuses/Orbits: Small left maxillary sinus mucous retention cyst and mild diffuse paranasal sinus mucosal thickening. Postsurgical changes related to partial ethmoidectomy and maxillary antrostomy bilaterally. Trace right mastoid effusion. Orbits are unremarkable. Other: None. MRA HEAD FINDINGS Internal carotid arteries: Mild bilateral cavernous and paraclinoid segment lumen irregularity likely related to atherosclerosis is stable and without significant stenosis or aneurysm identified. Anterior cerebral arteries:  Patent. Middle cerebral arteries: Patent. Anterior communicating artery: Patent. Posterior communicating arteries: Not identified, likely hypoplastic or absent. Posterior cerebral arteries:  Patent. Basilar artery:  Patent. Vertebral arteries:  Patent.  Left dominant. No evidence of high-grade stenosis, large vessel occlusion, or aneurysm. IMPRESSION: 1. Several subcentimeter scattered foci of acute/early subacute  infarction are present within the right posterior frontal lobe inclusive of precentral gyrus and the right posterior temporal lobe. No associated hemorrhage or significant mass effect. 2. Stable background of chronic infarctions, moderate microvascular ischemic changes of white matter, and moderate brain parenchymal volume loss. 3. Mild diffuse paranasal sinus disease. Trace right mastoid effusion. 4. Patent circle of Willis without evidence for high-grade stenosis, large vessel occlusion, or aneurysm. These results will be called to the ordering clinician or representative by the Radiologist Assistant, and communication documented in the PACS or zVision Dashboard. Electronically Signed   By: Kristine Garbe M.D.   On: 05/21/2016 00:26   Mr Jodene Nam Head/brain AT Cm  Result Date: 05/21/2016 CLINICAL DATA:  81 y/o  M; slurred speech and left facial droop. EXAM: MRI HEAD WITHOUT CONTRAST MRA HEAD WITHOUT CONTRAST TECHNIQUE: Multiplanar, multiecho pulse sequences of the brain and surrounding structures were obtained without intravenous contrast. Angiographic images of the head were obtained using MRA technique without contrast. COMPARISON:  05/20/2016 CT of the head. MRI of the head dated 05/13/2015. MRA of the head dated 05/14/2015. FINDINGS: MRI HEAD FINDINGS Brain: Several small scattered cortical foci of low diffusion within the right posterior frontal lobe with involvement of precentral gyrus and additional subcentimeter focus within the  right posterior temporal lobe compatible with acute/ early subacute infarction. Stable biparietal and right frontal chronic infarctions. Stable background of moderate chronic microvascular ischemic changes of white matter and parenchymal volume loss of the brain. Stable punctate focus of susceptibility hypointensity within the left medial parietal lobe and the right lateral frontal lobe compatible with hemosiderin deposition of old microhemorrhage. No new abnormal  susceptibility hypointensity to indicate intracranial hemorrhage. Stable ventricle size. No extra-axial collection. Vascular: As below. Skull and upper cervical spine: Normal marrow signal. Sinuses/Orbits: Small left maxillary sinus mucous retention cyst and mild diffuse paranasal sinus mucosal thickening. Postsurgical changes related to partial ethmoidectomy and maxillary antrostomy bilaterally. Trace right mastoid effusion. Orbits are unremarkable. Other: None. MRA HEAD FINDINGS Internal carotid arteries: Mild bilateral cavernous and paraclinoid segment lumen irregularity likely related to atherosclerosis is stable and without significant stenosis or aneurysm identified. Anterior cerebral arteries:  Patent. Middle cerebral arteries: Patent. Anterior communicating artery: Patent. Posterior communicating arteries: Not identified, likely hypoplastic or absent. Posterior cerebral arteries:  Patent. Basilar artery:  Patent. Vertebral arteries:  Patent.  Left dominant. No evidence of high-grade stenosis, large vessel occlusion, or aneurysm. IMPRESSION: 1. Several subcentimeter scattered foci of acute/early subacute infarction are present within the right posterior frontal lobe inclusive of precentral gyrus and the right posterior temporal lobe. No associated hemorrhage or significant mass effect. 2. Stable background of chronic infarctions, moderate microvascular ischemic changes of white matter, and moderate brain parenchymal volume loss. 3. Mild diffuse paranasal sinus disease. Trace right mastoid effusion. 4. Patent circle of Willis without evidence for high-grade stenosis, large vessel occlusion, or aneurysm. These results will be called to the ordering clinician or representative by the Radiologist Assistant, and communication documented in the PACS or zVision Dashboard. Electronically Signed   By: Kristine Garbe M.D.   On: 05/21/2016 00:26   Ct Head Code Stroke W/o Cm  Result Date:  05/20/2016 CLINICAL DATA:  Code stroke. Left-sided facial droop and slurred speech. EXAM: CT HEAD WITHOUT CONTRAST TECHNIQUE: Contiguous axial images were obtained from the base of the skull through the vertex without intravenous contrast. COMPARISON:  05/13/2015 FINDINGS: Brain: No evidence of acute infarction, hemorrhage, hydrocephalus, extra-axial collection or mass lesion/mass effect. There is a moderate area of remote infarct in the high right frontal cortex and white matter. Smaller right frontal/parietal infarct. Vascular: Extensive calcified atherosclerotic plaque. No hyperdense vessel. There is asymmetric sulcal calcification on the right, stable from prior and associated with remote infarcts- suspected remote embolic calcified plaque. Skull: No acute or aggressive finding Sinuses/Orbits: No acute finding Other: Text page with results sent on 05/20/2016 at 8:32 pm to Dr. Cheral Marker. ASPECTS [facility] Stroke Program Early CT Score) Not scored in the setting of remote right frontal infarct. IMPRESSION: 1. No acute finding. 2. Remote right frontal infarcts. Associated sulcal calcification suggesting remote calcified plaque embolus. Electronically Signed   By: Monte Fantasia M.D.   On: 05/20/2016 20:36    echocardiogram    LV EF: 55% -   60%  ------------------------------------------------------------------- Indications:      CVA 436.  ------------------------------------------------------------------- History:   PMH:   Myocardial infarction.  Risk factors:  Former tobacco use. Hypertension. Diabetes mellitus. Dyslipidemia.  ------------------------------------------------------------------- Study Conclusions  - Left ventricle: The cavity size was normal. Wall thickness was   increased in a pattern of mild LVH. There was mild focal basal   hypertrophy of the septum. Systolic function was normal. The   estimated ejection fraction was in the range of 55% to 60%.  Wall   motion was normal;  there were no regional wall motion   abnormalities. Doppler parameters are consistent with abnormal   left ventricular relaxation (grade 1 diastolic dysfunction).   Doppler parameters are consistent with high ventricular filling   pressure. - Mitral valve: Severely calcified annulus.  Impressions:  - Normal LV systolic function; grade 1 diastolic dysfunction with   elevated LV filling pressure; severe MAC; trace MR and TR.   Filed Weights   05/22/16 0519 05/23/16 0454 05/24/16 0500  Weight: 91.3 kg (201 lb 4.8 oz) 87.6 kg (193 lb 1.6 oz) 83 kg (182 lb 14.4 oz)     Microbiology: No results found for this or any previous visit (from the past 240 hour(s)).     Blood Culture    Component Value Date/Time   SDES URINE, CLEAN CATCH 05/13/2015 1932   SPECREQUEST NONE 05/13/2015 1932   CULT NO GROWTH 2 DAYS 05/13/2015 1932   REPTSTATUS 05/15/2015 FINAL 05/13/2015 1932      Labs: Results for orders placed or performed during the hospital encounter of 05/20/16 (from the past 48 hour(s))  Glucose, capillary     Status: Abnormal   Collection Time: 05/22/16  4:24 PM  Result Value Ref Range   Glucose-Capillary 194 (H) 65 - 99 mg/dL  Glucose, capillary     Status: Abnormal   Collection Time: 05/22/16  9:46 PM  Result Value Ref Range   Glucose-Capillary 129 (H) 65 - 99 mg/dL   Comment 1 Notify RN    Comment 2 Document in Chart   Glucose, capillary     Status: Abnormal   Collection Time: 05/23/16  6:25 AM  Result Value Ref Range   Glucose-Capillary 134 (H) 65 - 99 mg/dL   Comment 1 Notify RN    Comment 2 Document in Chart   Glucose, capillary     Status: Abnormal   Collection Time: 05/23/16 11:35 AM  Result Value Ref Range   Glucose-Capillary 149 (H) 65 - 99 mg/dL   Comment 1 Notify RN    Comment 2 Document in Chart   Glucose, capillary     Status: Abnormal   Collection Time: 05/23/16  4:48 PM  Result Value Ref Range   Glucose-Capillary 219 (H) 65 - 99 mg/dL   Comment 1  Notify RN    Comment 2 Document in Chart   Glucose, capillary     Status: Abnormal   Collection Time: 05/23/16  9:53 PM  Result Value Ref Range   Glucose-Capillary 112 (H) 65 - 99 mg/dL  Renal function panel     Status: Abnormal   Collection Time: 05/24/16  3:38 AM  Result Value Ref Range   Sodium 140 135 - 145 mmol/L   Potassium 4.1 3.5 - 5.1 mmol/L   Chloride 106 101 - 111 mmol/L   CO2 24 22 - 32 mmol/L   Glucose, Bld 101 (H) 65 - 99 mg/dL   BUN 57 (H) 6 - 20 mg/dL   Creatinine, Ser 4.25 (H) 0.61 - 1.24 mg/dL   Calcium 8.8 (L) 8.9 - 10.3 mg/dL   Phosphorus 4.2 2.5 - 4.6 mg/dL   Albumin 2.7 (L) 3.5 - 5.0 g/dL   GFR calc non Af Amer 12 (L) >60 mL/min   GFR calc Af Amer 14 (L) >60 mL/min    Comment: (NOTE) The eGFR has been calculated using the CKD EPI equation. This calculation has not been validated in all clinical situations. eGFR's persistently <60 mL/min signify possible Chronic  Kidney Disease.    Anion gap 10 5 - 15  CBC     Status: Abnormal   Collection Time: 05/24/16  3:38 AM  Result Value Ref Range   WBC 7.2 4.0 - 10.5 K/uL   RBC 3.90 (L) 4.22 - 5.81 MIL/uL   Hemoglobin 11.1 (L) 13.0 - 17.0 g/dL   HCT 34.1 (L) 39.0 - 52.0 %   MCV 87.4 78.0 - 100.0 fL   MCH 28.5 26.0 - 34.0 pg   MCHC 32.6 30.0 - 36.0 g/dL   RDW 15.2 11.5 - 15.5 %   Platelets 136 (L) 150 - 400 K/uL  Glucose, capillary     Status: None   Collection Time: 05/24/16  6:40 AM  Result Value Ref Range   Glucose-Capillary 77 65 - 99 mg/dL   Comment 1 Notify RN    Comment 2 Document in Chart   Lipid panel     Status: Abnormal   Collection Time: 05/24/16 10:22 AM  Result Value Ref Range   Cholesterol 135 0 - 200 mg/dL   Triglycerides 367 (H) <150 mg/dL   HDL 26 (L) >40 mg/dL   Total CHOL/HDL Ratio 5.2 RATIO   VLDL 73 (H) 0 - 40 mg/dL   LDL Cholesterol 36 0 - 99 mg/dL    Comment:        Total Cholesterol/HDL:CHD Risk Coronary Heart Disease Risk Table                     Men   Women  1/2 Average  Risk   3.4   3.3  Average Risk       5.0   4.4  2 X Average Risk   9.6   7.1  3 X Average Risk  23.4   11.0        Use the calculated Patient Ratio above and the CHD Risk Table to determine the patient's CHD Risk.        ATP III CLASSIFICATION (LDL):  <100     mg/dL   Optimal  100-129  mg/dL   Near or Above                    Optimal  130-159  mg/dL   Borderline  160-189  mg/dL   High  >190     mg/dL   Very High   Glucose, capillary     Status: Abnormal   Collection Time: 05/24/16 11:25 AM  Result Value Ref Range   Glucose-Capillary 192 (H) 65 - 99 mg/dL     Lipid Panel     Component Value Date/Time   CHOL 135 05/24/2016 1022   TRIG 367 (H) 05/24/2016 1022   HDL 26 (L) 05/24/2016 1022   CHOLHDL 5.2 05/24/2016 1022   VLDL 73 (H) 05/24/2016 1022   LDLCALC 36 05/24/2016 1022     Lab Results  Component Value Date   HGBA1C 8.3 (H) 05/21/2016   HGBA1C 8.8 (H) 05/14/2015   HGBA1C (H) 08/20/2009    8.2 (NOTE)                                                                       According to the ADA Clinical Practice Recommendations  for 2011, when HbA1c is used as a screening test:   >=6.5%   Diagnostic of Diabetes Mellitus           (if abnormal result  is confirmed)  5.7-6.4%   Increased risk of developing Diabetes Mellitus  References:Diagnosis and Classification of Diabetes Mellitus,Diabetes TOIZ,1245,80(DXIPJ 1):S62-S69 and Standards of Medical Care in         Diabetes - 2011,Diabetes ASNK,5397,67  (Suppl 1):S11-S61.     Lab Results  Component Value Date   LDLCALC 36 05/24/2016   CREATININE 4.25 (H) 05/24/2016       CHF, history of cryptogenic CVA, and chronic kidney disease stage IV who presents to the emergency department with slurred speech, difficulty eating, and left facial droop. Patient reports that he was having an uneventful day this morning, and was doing some work on a computer at home in the afternoon when he decided to take a nap at approximately 3 PM.  He reports waking at approximately 5:45 PM with a sensation of something stuck in his throat that he cannot clear. He then noted his speech to be slurred. He denied any associated chest pain or palpitations and denies any headache, change in vision or hearing, or focal numbness or weakness. There been no recent fall or trauma and he denies use of illicit substances or alcohol. He reports similar symptoms at time of his prior stroke. Patient reports going to a restaurant right after noticing the symptoms, had some difficulties swallowing and noted that the kept biting his tongue. He then went to visit a grandchild, but was noted by his daughter to have slurred speech and left facial droop and was taken to the ED instead  HOSPITAL COURSE:    Stroke:  right MCA scattered small to punctate infarcts embolic secondary to large vessel source from right ICA stenosis  Resultant  Mild left facial droop  Code Stroke CT no acute finding. Old R frontal infarcts. Sulcal calcification  MRI  R posterior frontal and temporal love infarcts.   MRA b/l cavernous ICA athero   Carotid Doppler  Right ICA 60-79% stenosis, progressed from 04/2015 which was 40-59% stenosis  LE venous doppler no DVT  2D Echo  EF 55-60%  Loop interrogation no afib  VVS consulted and plan for right CEA in 2 weeks.  LDL UTC due to high TG 749  UDS negative  HgbA1c 8.3  Heparin 5000 units sq tid for VTE prophylaxis  Diet heart healthy/carb modified Room service appropriate? Yes; Fluid consistency: Thin  aspirin 325 mg daily prior to admission, now on aspirin 325 mg daily. Recommend DAPT for 3 months and then plavix alone for further stroke prevention.    Symptomatic right ICA stenosis  Right ICA stenosis progressed from 04/2015 40-59% stenosis to 60-79% stenosis  Right MCA infarcts likely related to right ICA stenosis  Loop recorder no afib  VVS consulted and plan for right CEA in 2 weeks   Hypertension with  hypertensive urgency  - BP 210/95 on arrival - Labetalol IVP's prn SBP >210, or DBP >110;   Currently on Lasix 40 mg by mouth twice a day, hydralazine 100 mg by mouth 3 times a day, metoprolol 50 mg by mouth twice a day    AKI superimposed on CKD stage IV  - SCr is 4.51>4.25, up from apparent baseline of ~2.7-3.0 -nephrology consult appreciated- Cr improved Has had AVF evaluation and eventual plan is for R AVF so need to save R arm.  Chronic diastolic  CHF  - TTE (05/15/15) with EF 73-71%, grade 1 diastolic dysfunction, moderate LVH  - resume home meds - Follow daily wts and strict I/O's, daily chem panel   Insulin-dependent DM  - A1c was 8.8% in March 2017 now 8.3 - Managed at home with Humalog pump, 46 units basal, and 36 units bolus per day  - Continue Lantus 35 units with SSI  Hypothyroidism  - continue Synthroid   Anemia- on outpt Aranesp. Hb 11.1 at time of last injection, 12.3/11.9 on admission,   Discharge Exam:   Blood pressure 128/71, pulse 72, temperature 98.7 F (37.1 C), temperature source Oral, resp. rate 17, height _0  (1.727 m), weight 83 kg (182 lb 14.4 oz), SpO2 96 %.  General exam: Appears calm and comfortable - speech slurred Respiratory system: diminished, no wheezing Cardiovascular system: S1 & S2 heard, RRR. No JVD, murmurs, rubs, gallops or clicks. No pedal edema. Gastrointestinal system: Abdomen is nondistended, soft and nontender. No organomegaly or masses felt. Normal bowel sounds heard.     Follow-up Information    SETHI,PRAMOD, MD. Schedule an appointment as soon as possible for a visit in 6 week(s).   Specialties:  Neurology, Radiology Contact information: [address] [place] [address] [phone]        Geoffery Lyons, MD. Call.   Specialty:  Internal Medicine Why:  hospital follow up Contact information: [place] [address] [phone]            Signed: Reyne Dumas 05/24/2016, 12:47 PM        Time spent >45 mins

## 2016-05-24 NOTE — Clinical Social Work Note (Signed)
Clinical Social Worker facilitated patient discharge including contacting patient family and facility to confirm patient discharge plans.  Clinical information faxed to facility and family agreeable with plan.  CSW arranged ambulance transport via [facility] to [facility].  RN to call [phone] for report prior to discharge.  Clinical Social Worker will sign off for now as social work intervention is no longer needed. Please consult [place] again if new need arises.  [address] [place]

## 2016-05-24 NOTE — Progress Notes (Signed)
Report given to [facility], RN. Patient awaiting [facility] transport to SNF at this time. Patient denies questions or concerns at this time.

## 2016-05-24 NOTE — Care Management Important Message (Signed)
Important Message  Patient Details  Name: Robert [name] MRN: [id number] Date of Birth: [birth year]   Medicare Important Message Given:  Yes    Beata Beason 05/24/2016, 1:49 PM

## 2016-05-24 NOTE — Progress Notes (Signed)
[facility] Rounding Note  Subjective/Interval History:  For discharge to [facility] today for rehab Renal function improved to creatinine of 3.98 (was 3.57 in Feb at [facility] so very close to previous baseline), today back up some to 4.25, likely as a result of blood pressure control   Objective Vital signs in last 24 hours: Vitals:   05/24/16 0122 05/24/16 0500 05/24/16 0547 05/24/16 1018  BP: (!) 146/69  132/64 128/71  Pulse: 64  66 72  Resp: 20  20 17   Temp: 97.6 F (36.4 C)  98 F (36.7 C) 98.7 F (37.1 C)  TempSrc: Oral  Oral Oral  SpO2: 96%  95% 96%  Weight:  83 kg (182 lb 14.4 oz)    Height:       Weight change: -4.627 kg (-10 lb 3.2 oz) No intake or output data in the 24 hours ending 05/24/16 1500 Physical Exam:  Blood pressure 128/71, pulse 72, temperature 98.7 F (37.1 C), temperature source Oral, resp. rate 17, height 5\' 8"  (1.727 m), weight 83 kg (182 lb 14.4 oz), SpO2 96 %.   Gen: Ruddy complected WM, lying in bed VS as noted No rash, cyanosis but very ruddy faced Neck: ? Soft R carotid bruit Chest: Clear anteriorly Heart: Regular. S1S2 No S3. No murmur Abdomen: soft, no focal tenderness Ext: Trace pretibial edema Neuro: alert, Ox3   Recent Labs Lab 05/20/16 2043 05/20/16 2100 05/22/16 0509 05/24/16 0338  NA 139 140 141 140  K 4.5 4.5 4.1 4.1  CL 107 105 110 106  CO2 21*  --  22 24  GLUCOSE 262* 264* 119* 101*  BUN 53* 52* 53* 57*  CREATININE 4.31* 4.50* 3.98* 4.25*  CALCIUM 8.5*  --  8.2* 8.8*  PHOS  --   --  4.2 4.2     Recent Labs Lab 05/20/16 2043 05/22/16 0509 05/24/16 0338  AST 19  --   --   ALT 19  --   --   ALKPHOS 84  --   --   BILITOT 0.4  --   --   PROT 5.7*  --   --   ALBUMIN 3.0* 2.6* 2.7*     Recent Labs Lab 05/20/16 2043 05/20/16 2100 05/22/16 0509 05/24/16 0338  WBC 7.0  --  6.6 7.2  NEUTROABS 5.0  --   --   --   HGB 12.3* 11.9* 10.8* 11.1*  HCT 37.3* 35.0* 34.1* 34.1*  MCV 86.9  --  87.2 87.4  PLT 145*  --  150 136*      Recent Labs Lab 05/23/16 1135 05/23/16 1648 05/23/16 2153 05/24/16 0640 05/24/16 1125  GLUCAP 149* 219* 112* 77 192*    Studies/Results: No results found. Medications:  . aspirin  300 mg Rectal Daily   Or  . aspirin  325 mg Oral Daily  . calcitRIOL  0.25 mcg Oral Q M,W,F  . ezetimibe  10 mg Oral Daily  . furosemide  40 mg Oral BID  . heparin  5,000 Units Subcutaneous Q8H  . hydrALAZINE  100 mg Oral TID  . insulin aspart  0-15 Units Subcutaneous TID WC  . insulin aspart  0-5 Units Subcutaneous QHS  . insulin glargine  35 Units Subcutaneous QHS  . levothyroxine  50 mcg Oral QAC breakfast  . loratadine  10 mg Oral Daily  . metoprolol succinate  50 mg Oral BID  . multivitamin with minerals  1 tablet Oral Daily  . oxymetazoline  1 spray Each Nare BID  .  pantoprazole  40 mg Oral Daily  . simvastatin  40 mg Oral q1800     Background: 81 y.o. year-old gentleman with PMH of HTN, DM, chronic dCHF, h/o prior stroke, HLD. Presented to the ED on 05/20/16 with slurred speech, transient L facial droop. Head CT was neg x for old R frontal strokes, Carotid US 60-79% R carotid stenosis, ECHO w/nl EF, Grade 1 DD. Pt being evaluated by Dr. Trula Slade for carotid endarterectomy. Very hypertensive on admission (210/95) and currently on no BP meds.  We are asked to see b/o CKD. Pt is followed by Dr. Mercy Moore, last seen 04/18/16 with creatinine at that time 3.57. He is on Aranesp for anemia (100 mcg Q4wks, last Hb 11.1 on 04/23/16), has mild secondary HPT (PTH 116 03/2016) on TIW calcitriol. Has been evaluated for access by [facility] (Dr. Bridgett Larsson) for future R AVF but has not scheduled anything.  Pt's creatinine was noted to be 4.31 on admission, repeated 4.50. Not rechecked today. Has been given some slow IVF's as was felt dry on admission.  Assessment/Recommendations  1. 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 CEA in a couple of weeks.  2. 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 Will have office notify [facility] with hospital f/u appt for him with Dr. Mercy Moore. 3. Anemia - on outpt Aranesp. Hb 11.1 at time of last Aranesp 04/24/16. 11.1  today.  4. Secondary HPT - continue calcitriol 5. HTN - per neuro "permissive HTN but gradually normalize in 5-7 days" with goal 130-150 prior to CEA. Home meds metoprolol 50 BID, hydralazine 100 TID, furosemide 40 BID have all been resumed and BP looks great.   6. Chronic dCHF - lasix metoprolol 7. HLD - continue meds 8. DM - per primary service 9. Hypothyroidism - usual levo  Jamal Maes, MD [place] Pager 05/24/2016, 3:00 PM

## 2016-05-27 ENCOUNTER — Other Ambulatory Visit: Payer: Self-pay

## 2016-06-05 ENCOUNTER — Encounter (HOSPITAL_COMMUNITY)
Admission: RE | Admit: 2016-06-05 | Discharge: 2016-06-05 | Disposition: A | Discharge status: Home / Self Care | Payer: Medicare Other | Source: Ambulatory Visit | Attending: Surgery | Admitting: Surgery

## 2016-06-05 ENCOUNTER — Encounter (HOSPITAL_COMMUNITY): Payer: Self-pay

## 2016-06-05 HISTORY — DX: Malignant (primary) neoplasm, unspecified: C80.1

## 2016-06-05 HISTORY — DX: Personal history of urinary calculi: Z87.442

## 2016-06-05 HISTORY — DX: Dyspnea, unspecified: R06.00

## 2016-06-05 LAB — SURGICAL PCR SCREEN
MRSA, PCR: NEGATIVE
Staphylococcus aureus: NEGATIVE

## 2016-06-05 LAB — URINALYSIS, ROUTINE W REFLEX MICROSCOPIC
Bilirubin Urine: NEGATIVE
Glucose, UA: >=500 mg/dL — AB
HGB URINE DIPSTICK: NEGATIVE
Ketones, ur: NEGATIVE mg/dL
LEUKOCYTES UA: NEGATIVE
NITRITE: NEGATIVE
SPECIFIC GRAVITY, URINE: 1.011 (ref 1.005–1.030)
SQUAMOUS EPITHELIAL / LPF: NONE SEEN
pH: 5 (ref 5.0–8.0)

## 2016-06-05 LAB — PROTIME-INR
INR: 0.95
PROTHROMBIN TIME: 12.6 s (ref 11.4–15.2)

## 2016-06-05 LAB — COMPREHENSIVE METABOLIC PANEL
ALK PHOS: 89 U/L (ref 38–126)
ALT: 39 U/L (ref 17–63)
AST: 25 U/L (ref 15–41)
Albumin: 3.2 g/dL — ABNORMAL LOW (ref 3.5–5.0)
Anion gap: 11 (ref 5–15)
BILIRUBIN TOTAL: 0.5 mg/dL (ref 0.3–1.2)
BUN: 85 mg/dL — AB (ref 6–20)
CHLORIDE: 106 mmol/L (ref 101–111)
CO2: 20 mmol/L — ABNORMAL LOW (ref 22–32)
Calcium: 9.3 mg/dL (ref 8.9–10.3)
Creatinine, Ser: 4.78 mg/dL — ABNORMAL HIGH (ref 0.61–1.24)
GFR calc Af Amer: 12 mL/min — ABNORMAL LOW (ref >60)
GFR, EST NON AFRICAN AMERICAN: 11 mL/min — AB (ref >60)
GLUCOSE: 227 mg/dL — AB (ref 65–99)
Potassium: 4.7 mmol/L (ref 3.5–5.1)
Sodium: 137 mmol/L (ref 135–145)
TOTAL PROTEIN: 6.7 g/dL (ref 6.5–8.1)

## 2016-06-05 LAB — ABO/RH: ABO/RH(D): A NEG

## 2016-06-05 LAB — APTT: aPTT: 25 seconds (ref 24–36)

## 2016-06-05 LAB — CBC
HEMATOCRIT: 39 % (ref 39.0–52.0)
HEMOGLOBIN: 12.7 g/dL — AB (ref 13.0–17.0)
MCH: 28.3 pg (ref 26.0–34.0)
MCHC: 32.6 g/dL (ref 30.0–36.0)
MCV: 87.1 fL (ref 78.0–100.0)
Platelets: 205 10*3/uL (ref 150–400)
RBC: 4.48 MIL/uL (ref 4.22–5.81)
RDW: 14.9 % (ref 11.5–15.5)
WBC: 7.7 10*3/uL (ref 4.0–10.5)

## 2016-06-05 LAB — TYPE AND SCREEN
ABO/RH(D): A NEG
ANTIBODY SCREEN: NEGATIVE

## 2016-06-05 LAB — GLUCOSE, CAPILLARY: Glucose-Capillary: 183 mg/dL — ABNORMAL HIGH (ref 65–99)

## 2016-06-05 NOTE — Progress Notes (Signed)
   06/05/16 1528  OBSTRUCTIVE SLEEP APNEA  Have you ever been diagnosed with sleep apnea through a sleep study? No  Do you snore loudly (loud enough to be heard through closed doors)?  1  Do you often feel tired, fatigued, or sleepy during the daytime (such as falling asleep during driving or talking to someone)? 0  Has anyone observed you stop breathing during your sleep? 0  Do you have, or are you being treated for high blood pressure? 1  BMI more than 35 kg/m2? 0  Age > 50 (1-yes) 1  Neck circumference greater than:Male 16 inches or larger, Male 17inches or larger? 1  Male Gender (Yes=1) 1  Obstructive Sleep Apnea Score 5  Score 5 or greater  Results sent to PCP

## 2016-06-05 NOTE — Pre-Procedure Instructions (Signed)
Robert [name]  06/05/2016      [id number] [address], [place] [address] Phone: [phone] Fax: [phone]    Your procedure is scheduled on   Thursday  06/06/16  Report to [facility] Admitting at 530 A.M.  Call this number if you have problems the morning of surgery:  [phone]   Remember:  Do not eat food or drink liquids after midnight.  Take these medicines the morning of surgery with A SIP OF WATER   ALBUTEROL, HYDRALAZINE, LEVOTHYROXINE, METOPROLOL(TOPROL), OMEPRAZOLE (PRILOSEC)         How to Manage Your Diabetes Before and After Surgery  Why is it important to control my blood sugar before and after surgery? . Improving blood sugar levels before and after surgery helps healing and can limit problems. . A way of improving blood sugar control is eating a healthy diet by: o  Eating less sugar and carbohydrates o  Increasing activity/exercise o  Talking with your doctor about reaching your blood sugar goals . High blood sugars (greater than 180 mg/dL) can raise your risk of infections and slow your recovery, so you will need to focus on controlling your diabetes during the weeks before surgery. . Make sure that the doctor who takes care of your diabetes knows about your planned surgery including the date and location.  How do I manage my blood sugar before surgery? . Check your blood sugar at least 4 times a day, starting 2 days before surgery, to make sure that the level is not too high or low. o Check your blood sugar the morning of your surgery when you wake up and every 2 hours until you get to the Short Stay unit. . If your blood sugar is less than 70 mg/dL, you will need to treat for low blood sugar: o Do not take insulin. o Treat a low blood sugar (less than 70 mg/dL) with  cup of clear juice (cranberry or apple), 4 glucose tablets, OR glucose gel. o Recheck blood sugar in 15  minutes after treatment (to make sure it is greater than 70 mg/dL). If your blood sugar is not greater than 70 mg/dL on recheck, call [phone] for further instructions. . Report your blood sugar to the short stay nurse when you get to Short Stay.  . If you are admitted to the hospital after surgery: o Your blood sugar will be checked by the staff and you will probably be given insulin after surgery (instead of oral diabetes medicines) to make sure you have good blood sugar levels. o The goal for blood sugar control after surgery is 80-180 mg/dL.              WHAT DO I DO ABOUT MY DIABETES MEDICATION?   [name] Do not take oral diabetes medicines (pills) the morning of surgery.  . THE NIGHT BEFORE SURGERY, take __17________ units of ___LANTUS________insulin.       [name] HE MORNING OF SURGERY, take ____NONE _________ units of __________insulin.  . The day of surgery, do not take other diabetes injectables, including Byetta (exenatide), Bydureon (exenatide ER), Victoza (liraglutide), or Trulicity (dulaglutide).  . If your CBG is greater than 220 mg/dL, you may take  of your sliding scale (correction) dose of insulin.  Other Instructions:          Patient Signature:  Date:   Nurse Signature:  Date:   Reviewed and Endorsed by  [facility] Health Patient Education Committee, August 2015  Do not wear jewelry, make-up or nail polish.  Do not wear lotions, powders, or perfumes, or deoderant.  Do not shave 48 hours prior to surgery.  Men may shave face and neck.  Do not bring valuables to the hospital.  [facility] is not responsible for any belongings or valuables.  Contacts, dentures or bridgework may not be worn into surgery.  Leave your suitcase in the car.  After surgery it may be brought to your room.  For patients admitted to the hospital, discharge time will be determined by your treatment team.  Patients discharged the day of surgery will not be allowed to drive home.   Name  and phone number of your driver:    Special instructions:  [facility] - Preparing for Surgery  Before surgery, you can play an important role.  Because skin is not sterile, your skin needs to be as free of germs as possible.  You can reduce the number of germs on you skin by washing with CHG (chlorahexidine gluconate) soap before surgery.  CHG is an antiseptic cleaner which kills germs and bonds with the skin to continue killing germs even after washing.  Please DO NOT use if you have an allergy to CHG or antibacterial soaps.  If your skin becomes reddened/irritated stop using the CHG and inform your nurse when you arrive at Short Stay.  Do not shave (including legs and underarms) for at least 48 hours prior to the first CHG shower.  You may shave your face.  Please follow these instructions carefully:   1.  Shower with CHG Soap the night before surgery and the                                morning of Surgery.  2.  If you choose to wash your hair, wash your hair first as usual with your       normal shampoo.  3.  After you shampoo, rinse your hair and body thoroughly to remove the                      Shampoo.  4.  Use CHG as you would any other liquid soap.  You can apply chg directly       to the skin and wash gently with scrungie or a clean washcloth.  5.  Apply the CHG Soap to your body ONLY FROM THE NECK DOWN.        Do not use on open wounds or open sores.  Avoid contact with your eyes,       ears, mouth and genitals (private parts).  Wash genitals (private parts)       with your normal soap.  6.  Wash thoroughly, paying special attention to the area where your surgery        will be performed.  7.  Thoroughly rinse your body with warm water from the neck down.  8.  DO NOT shower/wash with your normal soap after using and rinsing off       the CHG Soap.  9.  Pat yourself dry with a clean towel.            10.  Wear clean pajamas.            11.  Place clean sheets on your bed the night  of your first shower and  do not        sleep with pets.  Day of Surgery  Do not apply any lotions/deoderants the morning of surgery.  Please wear clean clothes to the hospital/surgery center.    Please read over the following fact sheets that you were given. MRSA Information and Surgical Site Infection Prevention

## 2016-06-05 NOTE — Progress Notes (Signed)
LEFT MESSAGE WITH [facility] REQUESTING CARDIAC CLEARANCE NOTE TO BE FAXED IF AVAILABLE.

## 2016-06-05 NOTE — Progress Notes (Signed)
   06/05/16 1558  OBSTRUCTIVE SLEEP APNEA  Have you ever been diagnosed with sleep apnea through a sleep study? No  Do you snore loudly (loud enough to be heard through closed doors)?  1  Do you often feel tired, fatigued, or sleepy during the daytime (such as falling asleep during driving or talking to someone)? 0  Has anyone observed you stop breathing during your sleep? 0  Do you have, or are you being treated for high blood pressure? 1  BMI more than 35 kg/m2? 0  Age > 50 (1-yes) 1  Neck circumference greater than:Male 16 inches or larger, Male 17inches or larger? 1  Male Gender (Yes=1) 1  Obstructive Sleep Apnea Score 5  Score 5 or greater  Results sent to PCP

## 2016-06-06 ENCOUNTER — Encounter (HOSPITAL_COMMUNITY)
Admission: RE | Disposition: A | Discharge status: Skilled Nursing Facility | Payer: Self-pay | Source: Ambulatory Visit | Attending: Surgery

## 2016-06-06 ENCOUNTER — Inpatient Hospital Stay (HOSPITAL_COMMUNITY): Payer: Medicare Other | Admitting: Anesthesiology

## 2016-06-06 ENCOUNTER — Inpatient Hospital Stay (HOSPITAL_COMMUNITY)
Admission: RE | Admit: 2016-06-06 | Discharge: 2016-06-10 | DRG: 038 | Disposition: A | Discharge status: Skilled Nursing Facility | Payer: Medicare Other | Source: Ambulatory Visit | Attending: Surgery | Admitting: Surgery

## 2016-06-06 ENCOUNTER — Encounter (HOSPITAL_COMMUNITY): Payer: Self-pay

## 2016-06-06 DIAGNOSIS — E785 Hyperlipidemia, unspecified: Secondary | ICD-10-CM | POA: Diagnosis present

## 2016-06-06 DIAGNOSIS — I130 Hypertensive heart and chronic kidney disease with heart failure and stage 1 through stage 4 chronic kidney disease, or unspecified chronic kidney disease: Secondary | ICD-10-CM | POA: Diagnosis present

## 2016-06-06 DIAGNOSIS — I809 Phlebitis and thrombophlebitis of unspecified site: Secondary | ICD-10-CM | POA: Diagnosis not present

## 2016-06-06 DIAGNOSIS — Z955 Presence of coronary angioplasty implant and graft: Secondary | ICD-10-CM

## 2016-06-06 DIAGNOSIS — Z794 Long term (current) use of insulin: Secondary | ICD-10-CM

## 2016-06-06 DIAGNOSIS — E7800 Pure hypercholesterolemia, unspecified: Secondary | ICD-10-CM | POA: Diagnosis present

## 2016-06-06 DIAGNOSIS — I6521 Occlusion and stenosis of right carotid artery: Principal | ICD-10-CM | POA: Diagnosis present

## 2016-06-06 DIAGNOSIS — Z7982 Long term (current) use of aspirin: Secondary | ICD-10-CM

## 2016-06-06 DIAGNOSIS — K219 Gastro-esophageal reflux disease without esophagitis: Secondary | ICD-10-CM | POA: Diagnosis present

## 2016-06-06 DIAGNOSIS — E1151 Type 2 diabetes mellitus with diabetic peripheral angiopathy without gangrene: Secondary | ICD-10-CM | POA: Diagnosis present

## 2016-06-06 DIAGNOSIS — N179 Acute kidney failure, unspecified: Secondary | ICD-10-CM | POA: Diagnosis present

## 2016-06-06 DIAGNOSIS — I252 Old myocardial infarction: Secondary | ICD-10-CM

## 2016-06-06 DIAGNOSIS — N184 Chronic kidney disease, stage 4 (severe): Secondary | ICD-10-CM | POA: Diagnosis present

## 2016-06-06 DIAGNOSIS — R062 Wheezing: Secondary | ICD-10-CM

## 2016-06-06 DIAGNOSIS — Z87891 Personal history of nicotine dependence: Secondary | ICD-10-CM

## 2016-06-06 DIAGNOSIS — D631 Anemia in chronic kidney disease: Secondary | ICD-10-CM | POA: Diagnosis present

## 2016-06-06 DIAGNOSIS — E1122 Type 2 diabetes mellitus with diabetic chronic kidney disease: Secondary | ICD-10-CM | POA: Diagnosis present

## 2016-06-06 DIAGNOSIS — I5032 Chronic diastolic (congestive) heart failure: Secondary | ICD-10-CM | POA: Diagnosis present

## 2016-06-06 DIAGNOSIS — I69354 Hemiplegia and hemiparesis following cerebral infarction affecting left non-dominant side: Secondary | ICD-10-CM

## 2016-06-06 DIAGNOSIS — I2510 Atherosclerotic heart disease of native coronary artery without angina pectoris: Secondary | ICD-10-CM | POA: Diagnosis present

## 2016-06-06 HISTORY — PX: ENDARTERECTOMY: SHX5162

## 2016-06-06 LAB — GLUCOSE, CAPILLARY
GLUCOSE-CAPILLARY: 219 mg/dL — AB (ref 65–99)
GLUCOSE-CAPILLARY: 160 mg/dL — AB (ref 65–99)
GLUCOSE-CAPILLARY: 146 mg/dL — AB (ref 65–99)
Glucose-Capillary: 180 mg/dL — ABNORMAL HIGH (ref 65–99)

## 2016-06-06 LAB — URINALYSIS, MICROSCOPIC (REFLEX)

## 2016-06-06 LAB — URINALYSIS, ROUTINE W REFLEX MICROSCOPIC
Bilirubin Urine: NEGATIVE
Glucose, UA: 250 mg/dL — AB
HGB URINE DIPSTICK: NEGATIVE
KETONES UR: NEGATIVE mg/dL
Nitrite: NEGATIVE
Specific Gravity, Urine: 1.02 (ref 1.005–1.030)
pH: 5.5 (ref 5.0–8.0)

## 2016-06-06 LAB — CREATININE, SERUM
CREATININE: 4.38 mg/dL — AB (ref 0.61–1.24)
GFR calc non Af Amer: 12 mL/min — ABNORMAL LOW (ref >60)
GFR, EST AFRICAN AMERICAN: 14 mL/min — AB (ref >60)

## 2016-06-06 LAB — CBC
HCT: 27.9 % — ABNORMAL LOW (ref 39.0–52.0)
Hemoglobin: 9.2 g/dL — ABNORMAL LOW (ref 13.0–17.0)
MCH: 28.7 pg (ref 26.0–34.0)
MCHC: 33 g/dL (ref 30.0–36.0)
MCV: 86.9 fL (ref 78.0–100.0)
PLATELETS: 148 10*3/uL — AB (ref 150–400)
RBC: 3.21 MIL/uL — AB (ref 4.22–5.81)
RDW: 14.8 % (ref 11.5–15.5)
WBC: 8.8 10*3/uL (ref 4.0–10.5)

## 2016-06-06 LAB — SODIUM, URINE, RANDOM: Sodium, Ur: 31 mmol/L

## 2016-06-06 LAB — CREATININE, URINE, RANDOM: CREATININE, URINE: 126.8 mg/dL

## 2016-06-06 SURGERY — ENDARTERECTOMY, CAROTID
Anesthesia: General | Site: Neck | Laterality: Right

## 2016-06-06 MED ORDER — PANTOPRAZOLE SODIUM 40 MG PO TBEC
40.0000 mg | DELAYED_RELEASE_TABLET | Freq: Every day | ORAL | Status: DC
  Administered 2016-06-07 – 2016-06-10 (×4): 40 mg via ORAL
  Filled 2016-06-06 (×4): qty 1

## 2016-06-06 MED ORDER — LIDOCAINE HCL (PF) 1 % IJ SOLN
INTRAMUSCULAR | Status: AC
Start: 2016-06-06 — End: 2016-06-06
  Filled 2016-06-06: qty 30

## 2016-06-06 MED ORDER — SUGAMMADEX SODIUM 200 MG/2ML IV SOLN
INTRAVENOUS | Status: DC | PRN
  Administered 2016-06-06: 150 mg via INTRAVENOUS

## 2016-06-06 MED ORDER — HEPARIN SODIUM (PORCINE) 1000 UNIT/ML IJ SOLN
INTRAMUSCULAR | Status: AC
  Filled 2016-06-06: qty 1

## 2016-06-06 MED ORDER — SUGAMMADEX SODIUM 200 MG/2ML IV SOLN
INTRAVENOUS | Status: AC
  Filled 2016-06-06: qty 2

## 2016-06-06 MED ORDER — LACTATED RINGERS IV SOLN
INTRAVENOUS | Status: DC | PRN
  Administered 2016-06-06: 07:00:00 via INTRAVENOUS

## 2016-06-06 MED ORDER — INSULIN ASPART 100 UNIT/ML ~~LOC~~ SOLN
0.0000 [IU] | Freq: Three times a day (TID) | SUBCUTANEOUS | Status: DC
  Administered 2016-06-06: 2 [IU] via SUBCUTANEOUS
  Administered 2016-06-07 – 2016-06-08 (×3): 3 [IU] via SUBCUTANEOUS
  Administered 2016-06-08: 2 [IU] via SUBCUTANEOUS
  Administered 2016-06-09 – 2016-06-10 (×5): 3 [IU] via SUBCUTANEOUS

## 2016-06-06 MED ORDER — HYDRALAZINE HCL 20 MG/ML IJ SOLN
5.0000 mg | INTRAMUSCULAR | Status: AC | PRN
  Administered 2016-06-07 – 2016-06-08 (×2): 5 mg via INTRAVENOUS
  Filled 2016-06-06 (×2): qty 1

## 2016-06-06 MED ORDER — OXYCODONE-ACETAMINOPHEN 5-325 MG PO TABS: 1.0000 | ORAL_TABLET | ORAL | Status: DC | PRN

## 2016-06-06 MED ORDER — POTASSIUM CHLORIDE CRYS ER 20 MEQ PO TBCR: 20.0000 meq | EXTENDED_RELEASE_TABLET | Freq: Every day | ORAL | Status: DC | PRN

## 2016-06-06 MED ORDER — HEMOSTATIC AGENTS (NO CHARGE) OPTIME
TOPICAL | Status: DC | PRN
  Administered 2016-06-06: 1 via TOPICAL

## 2016-06-06 MED ORDER — CALCITRIOL 0.25 MCG PO CAPS
0.2500 ug | ORAL_CAPSULE | ORAL | Status: DC
  Administered 2016-06-07 – 2016-06-10 (×2): 0.25 ug via ORAL
  Filled 2016-06-06: qty 1

## 2016-06-06 MED ORDER — GLYCOPYRROLATE 0.2 MG/ML IJ SOLN
INTRAMUSCULAR | Status: DC | PRN
  Administered 2016-06-06: .4 mg via INTRAVENOUS

## 2016-06-06 MED ORDER — GUAIFENESIN-DM 100-10 MG/5ML PO SYRP: 15.0000 mL | ORAL_SOLUTION | ORAL | Status: DC | PRN

## 2016-06-06 MED ORDER — INSULIN GLARGINE 100 UNIT/ML ~~LOC~~ SOLN
35.0000 [IU] | Freq: Every day | SUBCUTANEOUS | Status: DC
  Administered 2016-06-06 – 2016-06-09 (×4): 35 [IU] via SUBCUTANEOUS
  Filled 2016-06-06 (×7): qty 0.35

## 2016-06-06 MED ORDER — SODIUM CHLORIDE 0.9 % IV SOLN: 500.0000 mL | Freq: Once | INTRAVENOUS | Status: DC | PRN

## 2016-06-06 MED ORDER — HYDROMORPHONE HCL 1 MG/ML IJ SOLN
INTRAMUSCULAR | Status: AC
  Filled 2016-06-06: qty 0.5

## 2016-06-06 MED ORDER — CHLORHEXIDINE GLUCONATE CLOTH 2 % EX PADS: 6.0000 | MEDICATED_PAD | Freq: Once | CUTANEOUS | Status: DC

## 2016-06-06 MED ORDER — FENTANYL CITRATE (PF) 250 MCG/5ML IJ SOLN
INTRAMUSCULAR | Status: AC
  Filled 2016-06-06: qty 5

## 2016-06-06 MED ORDER — PHENYLEPHRINE 40 MCG/ML (10ML) SYRINGE FOR IV PUSH (FOR BLOOD PRESSURE SUPPORT)
PREFILLED_SYRINGE | INTRAVENOUS | Status: AC
  Filled 2016-06-06: qty 10

## 2016-06-06 MED ORDER — LABETALOL HCL 5 MG/ML IV SOLN
10.0000 mg | INTRAVENOUS | Status: DC | PRN
  Administered 2016-06-06 – 2016-06-08 (×2): 10 mg via INTRAVENOUS
  Filled 2016-06-06: qty 4

## 2016-06-06 MED ORDER — DEXTROSE 5 % IV SOLN
1.5000 g | Freq: Two times a day (BID) | INTRAVENOUS | Status: AC
  Administered 2016-06-06 – 2016-06-07 (×2): 1.5 g via INTRAVENOUS
  Filled 2016-06-06 (×2): qty 1.5

## 2016-06-06 MED ORDER — FUROSEMIDE 40 MG PO TABS
40.0000 mg | ORAL_TABLET | Freq: Two times a day (BID) | ORAL | Status: DC
  Administered 2016-06-06: 40 mg via ORAL
  Filled 2016-06-06: qty 1

## 2016-06-06 MED ORDER — ARTIFICIAL TEARS OP OINT
TOPICAL_OINTMENT | OPHTHALMIC | Status: DC | PRN
  Administered 2016-06-06: 1 via OPHTHALMIC

## 2016-06-06 MED ORDER — ONDANSETRON HCL 4 MG/2ML IJ SOLN
INTRAMUSCULAR | Status: DC | PRN
  Administered 2016-06-06: 4 mg via INTRAVENOUS

## 2016-06-06 MED ORDER — ASPIRIN EC 325 MG PO TBEC
325.0000 mg | DELAYED_RELEASE_TABLET | Freq: Every day | ORAL | Status: DC
  Administered 2016-06-06 – 2016-06-10 (×5): 325 mg via ORAL
  Filled 2016-06-06 (×5): qty 1

## 2016-06-06 MED ORDER — ROCURONIUM BROMIDE 100 MG/10ML IV SOLN
INTRAVENOUS | Status: DC | PRN
  Administered 2016-06-06: 50 mg via INTRAVENOUS

## 2016-06-06 MED ORDER — PROTAMINE SULFATE 10 MG/ML IV SOLN
INTRAVENOUS | Status: AC
  Filled 2016-06-06: qty 5

## 2016-06-06 MED ORDER — HEPARIN SODIUM (PORCINE) 1000 UNIT/ML IJ SOLN
INTRAMUSCULAR | Status: DC | PRN
  Administered 2016-06-06: 2000 [IU] via INTRAVENOUS
  Administered 2016-06-06: 8000 [IU] via INTRAVENOUS

## 2016-06-06 MED ORDER — HYDROMORPHONE HCL 1 MG/ML IJ SOLN
0.2500 mg | INTRAMUSCULAR | Status: DC | PRN
  Administered 2016-06-06 (×2): 0.25 mg via INTRAVENOUS

## 2016-06-06 MED ORDER — SODIUM CHLORIDE 0.9 % IV SOLN
0.0125 ug/kg/min | INTRAVENOUS | Status: AC
  Administered 2016-06-06: .2 ug/kg/min via INTRAVENOUS
  Filled 2016-06-06: qty 2000

## 2016-06-06 MED ORDER — PROTAMINE SULFATE 10 MG/ML IV SOLN
INTRAVENOUS | Status: DC | PRN
  Administered 2016-06-06: 50 mg via INTRAVENOUS
  Administered 2016-06-06: 25 mg via INTRAVENOUS

## 2016-06-06 MED ORDER — MORPHINE SULFATE (PF) 2 MG/ML IV SOLN: 2.0000 mg | INTRAVENOUS | Status: DC | PRN

## 2016-06-06 MED ORDER — ONDANSETRON HCL 4 MG/2ML IJ SOLN
4.0000 mg | Freq: Four times a day (QID) | INTRAMUSCULAR | Status: DC | PRN
  Administered 2016-06-07 – 2016-06-09 (×2): 4 mg via INTRAVENOUS
  Filled 2016-06-06 (×2): qty 2

## 2016-06-06 MED ORDER — PHENYLEPHRINE HCL 10 MG/ML IJ SOLN
INTRAVENOUS | Status: DC | PRN
  Administered 2016-06-06: 40 ug/min via INTRAVENOUS

## 2016-06-06 MED ORDER — EZETIMIBE 10 MG PO TABS
10.0000 mg | ORAL_TABLET | Freq: Every day | ORAL | Status: DC
  Administered 2016-06-06 – 2016-06-10 (×5): 10 mg via ORAL
  Filled 2016-06-06 (×5): qty 1

## 2016-06-06 MED ORDER — DEXTROSE 5 % IV SOLN
1.5000 g | INTRAVENOUS | Status: AC
  Administered 2016-06-06: 1.5 g via INTRAVENOUS
  Filled 2016-06-06: qty 1.5

## 2016-06-06 MED ORDER — ATROPINE SULFATE 0.4 MG/ML IJ SOLN
INTRAMUSCULAR | Status: DC | PRN
  Administered 2016-06-06: .2 mg via INTRAVENOUS

## 2016-06-06 MED ORDER — LABETALOL HCL 5 MG/ML IV SOLN
INTRAVENOUS | Status: AC
  Filled 2016-06-06: qty 4

## 2016-06-06 MED ORDER — MAGNESIUM SULFATE 2 GM/50ML IV SOLN: 2.0000 g | Freq: Every day | INTRAVENOUS | Status: DC | PRN

## 2016-06-06 MED ORDER — HYDROMORPHONE HCL 1 MG/ML IJ SOLN: 0.2500 mg | INTRAMUSCULAR | Status: DC | PRN

## 2016-06-06 MED ORDER — SUCCINYLCHOLINE CHLORIDE 200 MG/10ML IV SOSY
PREFILLED_SYRINGE | INTRAVENOUS | Status: AC
  Filled 2016-06-06: qty 10

## 2016-06-06 MED ORDER — ALBUTEROL SULFATE (2.5 MG/3ML) 0.083% IN NEBU: 3.0000 mL | INHALATION_SOLUTION | Freq: Four times a day (QID) | RESPIRATORY_TRACT | Status: DC | PRN

## 2016-06-06 MED ORDER — ROCURONIUM BROMIDE 50 MG/5ML IV SOSY
PREFILLED_SYRINGE | INTRAVENOUS | Status: AC
  Filled 2016-06-06: qty 5

## 2016-06-06 MED ORDER — ALUM & MAG HYDROXIDE-SIMETH 200-200-20 MG/5ML PO SUSP: 15.0000 mL | ORAL | Status: DC | PRN

## 2016-06-06 MED ORDER — FENTANYL CITRATE (PF) 100 MCG/2ML IJ SOLN
INTRAMUSCULAR | Status: DC | PRN
  Administered 2016-06-06: 150 ug via INTRAVENOUS

## 2016-06-06 MED ORDER — SODIUM CHLORIDE 0.9 % IV SOLN
INTRAVENOUS | Status: DC
  Administered 2016-06-06: 13:00:00 via INTRAVENOUS

## 2016-06-06 MED ORDER — ONDANSETRON HCL 4 MG/2ML IJ SOLN
INTRAMUSCULAR | Status: AC
  Filled 2016-06-06: qty 2

## 2016-06-06 MED ORDER — PHENYLEPHRINE HCL 10 MG/ML IJ SOLN
INTRAMUSCULAR | Status: DC | PRN
  Administered 2016-06-06: 80 ug via INTRAVENOUS
  Administered 2016-06-06: 40 ug via INTRAVENOUS
  Administered 2016-06-06 (×2): 80 ug via INTRAVENOUS
  Administered 2016-06-06 (×2): 20 ug via INTRAVENOUS
  Administered 2016-06-06: 30 ug via INTRAVENOUS
  Administered 2016-06-06 (×2): 40 ug via INTRAVENOUS

## 2016-06-06 MED ORDER — EPHEDRINE 5 MG/ML INJ
INTRAVENOUS | Status: AC
  Filled 2016-06-06: qty 10

## 2016-06-06 MED ORDER — METOPROLOL TARTRATE 5 MG/5ML IV SOLN: 2.0000 mg | INTRAVENOUS | Status: DC | PRN

## 2016-06-06 MED ORDER — PROPOFOL 10 MG/ML IV BOLUS
INTRAVENOUS | Status: DC | PRN
  Administered 2016-06-06: 100 mg via INTRAVENOUS

## 2016-06-06 MED ORDER — SODIUM CHLORIDE 0.9 % IV SOLN: INTRAVENOUS | Status: DC

## 2016-06-06 MED ORDER — LEVOTHYROXINE SODIUM 50 MCG PO TABS
50.0000 ug | ORAL_TABLET | Freq: Every day | ORAL | Status: DC
  Administered 2016-06-07 – 2016-06-10 (×4): 50 ug via ORAL
  Filled 2016-06-06 (×5): qty 1

## 2016-06-06 MED ORDER — ALBUMIN HUMAN 5 % IV SOLN
INTRAVENOUS | Status: DC | PRN
  Administered 2016-06-06: 11:00:00 via INTRAVENOUS

## 2016-06-06 MED ORDER — METOPROLOL SUCCINATE ER 50 MG PO TB24
50.0000 mg | ORAL_TABLET | Freq: Two times a day (BID) | ORAL | Status: DC
  Administered 2016-06-06 – 2016-06-10 (×8): 50 mg via ORAL
  Filled 2016-06-06 (×8): qty 1

## 2016-06-06 MED ORDER — PROPOFOL 10 MG/ML IV BOLUS
INTRAVENOUS | Status: AC
  Filled 2016-06-06: qty 20

## 2016-06-06 MED ORDER — DOCUSATE SODIUM 100 MG PO CAPS
100.0000 mg | ORAL_CAPSULE | Freq: Every day | ORAL | Status: DC
  Administered 2016-06-07 – 2016-06-10 (×4): 100 mg via ORAL
  Filled 2016-06-06 (×4): qty 1

## 2016-06-06 MED ORDER — LIDOCAINE 2% (20 MG/ML) 5 ML SYRINGE
INTRAMUSCULAR | Status: AC
  Filled 2016-06-06: qty 10

## 2016-06-06 MED ORDER — HYDRALAZINE HCL 50 MG PO TABS
100.0000 mg | ORAL_TABLET | Freq: Three times a day (TID) | ORAL | Status: DC
  Administered 2016-06-06 – 2016-06-10 (×12): 100 mg via ORAL
  Filled 2016-06-06 (×13): qty 2

## 2016-06-06 MED ORDER — HEPARIN SODIUM (PORCINE) 5000 UNIT/ML IJ SOLN
5000.0000 [IU] | Freq: Three times a day (TID) | INTRAMUSCULAR | Status: DC
  Administered 2016-06-07 – 2016-06-10 (×9): 5000 [IU] via SUBCUTANEOUS
  Filled 2016-06-06 (×9): qty 1

## 2016-06-06 MED ORDER — PHENOL 1.4 % MT LIQD: 1.0000 | OROMUCOSAL | Status: DC | PRN

## 2016-06-06 MED ORDER — CLOPIDOGREL BISULFATE 75 MG PO TABS
75.0000 mg | ORAL_TABLET | Freq: Every day | ORAL | Status: DC
  Administered 2016-06-06 – 2016-06-10 (×5): 75 mg via ORAL
  Filled 2016-06-06 (×5): qty 1

## 2016-06-06 MED ORDER — 0.9 % SODIUM CHLORIDE (POUR BTL) OPTIME
TOPICAL | Status: DC | PRN
  Administered 2016-06-06 (×3): 1000 mL

## 2016-06-06 MED ORDER — ADULT MULTIVITAMIN W/MINERALS CH
1.0000 | ORAL_TABLET | Freq: Every day | ORAL | Status: DC
  Administered 2016-06-07 – 2016-06-10 (×4): 1 via ORAL
  Filled 2016-06-06 (×4): qty 1

## 2016-06-06 MED ORDER — SIMVASTATIN 40 MG PO TABS
40.0000 mg | ORAL_TABLET | Freq: Every day | ORAL | Status: DC
  Administered 2016-06-06 – 2016-06-10 (×4): 40 mg via ORAL
  Filled 2016-06-06 (×5): qty 1

## 2016-06-06 MED ORDER — SODIUM CHLORIDE 0.9 % IV SOLN
INTRAVENOUS | Status: DC | PRN
  Administered 2016-06-06: 500 mL

## 2016-06-06 SURGICAL SUPPLY — 56 items
CANISTER SUCT 3000ML PPV (MISCELLANEOUS) ×3 IMPLANT
CATH ROBINSON RED A/P 18FR (CATHETERS) ×3 IMPLANT
CATH SUCT 10FR WHISTLE TIP (CATHETERS) ×3 IMPLANT
CLIP TI MEDIUM 6 (CLIP) ×3 IMPLANT
CLIP TI WIDE RED SMALL 24 (CLIP) ×3 IMPLANT
CLIP TI WIDE RED SMALL 6 (CLIP) ×3 IMPLANT
CRADLE DONUT ADULT HEAD (MISCELLANEOUS) ×3 IMPLANT
DERMABOND ADVANCED (GAUZE/BANDAGES/DRESSINGS) ×2
DERMABOND ADVANCED .7 DNX12 (GAUZE/BANDAGES/DRESSINGS) ×1 IMPLANT
DRAIN CHANNEL 15F RND FF W/TCR (WOUND CARE) ×3 IMPLANT
ELECT REM PT RETURN 9FT ADLT (ELECTROSURGICAL) ×3
ELECTRODE REM PT RTRN 9FT ADLT (ELECTROSURGICAL) ×1 IMPLANT
EVACUATOR SILICONE 100CC (DRAIN) ×3 IMPLANT
GAUZE SPONGE 4X4 12PLY STRL LF (GAUZE/BANDAGES/DRESSINGS) ×3 IMPLANT
GLOVE BIO SURGEON STRL SZ 6.5 (GLOVE) ×2 IMPLANT
GLOVE BIO SURGEONS STRL SZ 6.5 (GLOVE) ×1
GLOVE BIOGEL PI IND STRL 6.5 (GLOVE) ×1 IMPLANT
GLOVE BIOGEL PI IND STRL 7.0 (GLOVE) ×4 IMPLANT
GLOVE BIOGEL PI IND STRL 7.5 (GLOVE) ×1 IMPLANT
GLOVE BIOGEL PI INDICATOR 6.5 (GLOVE) ×2
GLOVE BIOGEL PI INDICATOR 7.0 (GLOVE) ×8
GLOVE BIOGEL PI INDICATOR 7.5 (GLOVE) ×2
GLOVE SURG SS PI 6.5 STRL IVOR (GLOVE) ×12 IMPLANT
GLOVE SURG SS PI 7.5 STRL IVOR (GLOVE) ×3 IMPLANT
GOWN STRL REUS W/ TWL LRG LVL3 (GOWN DISPOSABLE) ×5 IMPLANT
GOWN STRL REUS W/ TWL XL LVL3 (GOWN DISPOSABLE) ×1 IMPLANT
GOWN STRL REUS W/TWL LRG LVL3 (GOWN DISPOSABLE) ×10
GOWN STRL REUS W/TWL XL LVL3 (GOWN DISPOSABLE) ×2
HEMOSTAT SNOW SURGICEL 2X4 (HEMOSTASIS) IMPLANT
INSERT FOGARTY SM (MISCELLANEOUS) IMPLANT
KIT BASIN OR (CUSTOM PROCEDURE TRAY) ×3 IMPLANT
KIT ROOM TURNOVER OR (KITS) ×3 IMPLANT
NEEDLE HYPO 25GX1X1/2 BEV (NEEDLE) IMPLANT
NS IRRIG 1000ML POUR BTL (IV SOLUTION) ×9 IMPLANT
PACK CAROTID (CUSTOM PROCEDURE TRAY) ×3 IMPLANT
PAD ARMBOARD 7.5X6 YLW CONV (MISCELLANEOUS) ×6 IMPLANT
PATCH VASC XENOSURE 1CMX6CM (Vascular Products) ×2 IMPLANT
PATCH VASC XENOSURE 1X6 (Vascular Products) ×1 IMPLANT
POWDER SURGICEL 3.0 GRAM (HEMOSTASIS) ×3 IMPLANT
SHUNT CAROTID BYPASS 10 (VASCULAR PRODUCTS) IMPLANT
SHUNT CAROTID BYPASS 12FRX15.5 (VASCULAR PRODUCTS) IMPLANT
SPONGE INTESTINAL PEANUT (DISPOSABLE) ×3 IMPLANT
SPONGE LAP 18X18 X RAY DECT (DISPOSABLE) ×3 IMPLANT
SUT ETHILON 3 0 PS 1 (SUTURE) ×3 IMPLANT
SUT PROLENE 5 0 C 1 24 (SUTURE) ×15 IMPLANT
SUT PROLENE 6 0 BV (SUTURE) ×33 IMPLANT
SUT PROLENE 7 0 BV 1 (SUTURE) IMPLANT
SUT PROLENE 7 0 BV1 MDA (SUTURE) ×3 IMPLANT
SUT SILK 3 0 (SUTURE) ×2
SUT SILK 3-0 18XBRD TIE 12 (SUTURE) ×1 IMPLANT
SUT VIC AB 3-0 SH 27 (SUTURE) ×4
SUT VIC AB 3-0 SH 27X BRD (SUTURE) ×2 IMPLANT
SUT VICRYL 4-0 PS2 18IN ABS (SUTURE) ×6 IMPLANT
SYR CONTROL 10ML LL (SYRINGE) IMPLANT
TAPE CLOTH SURG 4X10 WHT LF (GAUZE/BANDAGES/DRESSINGS) ×3 IMPLANT
WATER STERILE IRR 1000ML POUR (IV SOLUTION) ×3 IMPLANT

## 2016-06-06 NOTE — Anesthesia Postprocedure Evaluation (Signed)
Anesthesia Post Note  Patient: HAWKEN BIELBY  Procedure(s) Performed: Procedure(s) (LRB): RIGHT CAROTID ENDARTERECTOMY WITH PATCH ANGIOPLASTY (Right)  Patient location during evaluation: PACU Anesthesia Type: General Level of consciousness: awake and alert Pain management: pain level controlled Vital Signs Assessment: post-procedure vital signs reviewed and stable Respiratory status: spontaneous breathing, nonlabored ventilation, respiratory function stable and patient connected to nasal cannula oxygen Cardiovascular status: blood pressure returned to baseline and stable Postop Assessment: no signs of nausea or vomiting Anesthetic complications: no       Last Vitals:  Vitals:   06/06/16 1220 06/06/16 1230  BP: (!) 135/56 (!) 127/57  Pulse: 64 65  Resp: 19 18  Temp:      Last Pain:  Vitals:   06/06/16 1230  TempSrc:   PainSc: Asleep    LLE Motor Response: Purposeful movement (06/06/16 1230) LLE Sensation: Full sensation (06/06/16 1230) RLE Motor Response: Purposeful movement (06/06/16 1230) RLE Sensation: Full sensation (06/06/16 1230)      Riverlyn Kizziah,W. EDMOND

## 2016-06-06 NOTE — Op Note (Signed)
Patient name: Robert [name] MRN: [id number] DOB: [birth year] Sex: male  06/06/2016 Pre-operative Diagnosis: Symptomatic   right carotid stenosis Post-operative diagnosis:  Same Surgeon:  [name] Assistants:  Silva Bandy Procedure:    right carotid Endarterectomy with bovine pericardial patch angioplasty Anesthesia:  General Blood Loss:  See anesthesia record Specimens:  none  Findings:  90 %stenosis; Thrombus:  none  Indications:  This is a 81 year old male who came to the hospital 2 weeks ago with a right brain stroke.  His workup revealed a high grade right carotid stenosis.  He continues to have left arm weakness.  His renal function has worsened during his rehab admission at [facility].   I again discussed the details of the procedure as well as the risks with the patient and his daughters.  They wish to proceed  Procedure:  The patient was identified in the holding area and taken to [facility]  The patient was then placed supine on the table.   General endotrachial anesthesia was administered.  The patient was prepped and draped in the usual sterile fashion.  A time out was called and antibiotics were administered.  The incision was made along the anterior border of the right sternocleidomastoid muscle.  Cautery was used to dissect through the subcutaneous tissue.  The platysma muscle was divided with cautery.  The internal jugular vein was exposed along its anterior medial border.  The common facial vein was exposed and then divided between 2-0 silk ties and metal clips.  The common carotid artery was then circumferentially exposed and encircled with an umbilical tape.  The vagus nerve was identified and protected.  Next sharp dissection was used to expose the external carotid artery and the superior thyroid artery.  The were encircled with a blue vessel loop and a 2-0 silk tie respectively.  Finally, the internal carotid was carefully dissected free.  An umbilical tape was placed  around the internal carotid artery distal to the diseased segment.  The hypoglossal nerve was visualized throughout and protected.  The patient was given systemic heparinization.  A bovine carotid patch was selected and prepared on the back table.  A 10 french shunt was also prepared.  After blood pressure readings were appropriate and the heparin had been given time to circulate, the internal carotid artery was occluded with a baby Gregory clamp.  The external and common carotid arteries were then occluded with vascular clamps and the 2-0 tie tightened on the superior thyroid artery.  A #11 blade was used to make an arteriotomy in the common carotid artery.  This was extended with Potts scissors along the anterior and lateral border of the common and internal carotid artery.  Approximately 90% stenosis was identified.  There was no thrombus identified.  The 10 french shunt was then placed.  A kleiner kuntz elevator was used to perform endarterectomy.  An eversion endarterectomy was performed in the external carotid artery.  I had to remove the shunt midway through the endarterectomy in order to adequately visulaize the distal artery.A good distal endpoint was obtained in the internal carotid artery.  The specimen was removed and sent to pathology.  Heparinized saline was used to irrigate the endarterectomized field.  All potential embolic debris was removed.  Bovine pericardial patch angioplasty was then performed using a running 6-0 Prolene.  The common internal and external carotid arteries were all appropriately flushed. The artery was again irrigated with heparin saline.  The anastomosis was  then secured. The clamp was first released on the external carotid artery followed by the common carotid artery approximately 30 seconds later, bloodflow was reestablish through the internal carotid artery.  Next, a hand-held  Doppler was used to evaluate the signals in the common, external, and internal  carotid  arteries, all of which had appropriate signals. I then administered  50 mg protamine. The wound was then irrigated.  After hemostasis was achieved, the carotid sheath was reapproximated with 3-0 Vicryl. The  platysma muscle was reapproximated with running 3-0 Vicryl. The skin  was closed with 4-0 Vicryl. Dermabond was placed on the skin. The  patient was then successfully extubated. His neurologic exam was  similar to his preprocedural exam. The patient was then taken to recovery room  in stable condition. There were no complications.     Disposition:  To PACU in stable condition.  Relevant Operative Details:  The internal carotid aretery was approximately 180 degrees rotated.  The distal endpoint was very high up under the hypoglossal.  Visualization was very limited and in order to adequately visualize the endpoint, I had to remove the shunt.  Even with the shunt out, It was difficult to visualize the distal endpoint.  Ultimately, I was satisfied with the endarterectomy.  I did place 3 7-0 prolene sutures to tack down the distal endpoint.  I closed the common carotid artery primarily and used a bovine patch on the distal common carotid artery and the internal carotid.  Because of diffuse oozing, I used a 70F Blake drain  V. [name], M.D. [facility] Office: [phone] Pager:  [phone]

## 2016-06-06 NOTE — Anesthesia Preprocedure Evaluation (Signed)
Anesthesia Evaluation  Patient identified by MRN, date of birth, ID band Patient awake    Reviewed: Allergy & Precautions, NPO status , Patient's Chart, lab work & pertinent test results  Airway Mallampati: II  TM Distance: >3 FB Neck ROM: Full    Dental no notable dental hx.    Pulmonary shortness of breath, former smoker,    Pulmonary exam normal breath sounds clear to auscultation       Cardiovascular hypertension, + Past MI, + Peripheral Vascular Disease and +CHF  Normal cardiovascular exam Rhythm:Regular Rate:Normal     Neuro/Psych PSYCHIATRIC DISORDERS  Neuromuscular disease CVA, Residual Symptoms negative psych ROS   GI/Hepatic Neg liver ROS, GERD  ,  Endo/Other  negative endocrine ROSdiabetes  Renal/GU Renal disease  negative genitourinary   Musculoskeletal negative musculoskeletal ROS (+)   Abdominal   Peds negative pediatric ROS (+)  Hematology  (+) anemia ,   Anesthesia Other Findings   Reproductive/Obstetrics negative OB ROS                             Anesthesia Physical Anesthesia Plan  ASA: III  Anesthesia Plan: General   Post-op Pain Management:    Induction: Intravenous  Airway Management Planned: Oral ETT  Additional Equipment: Arterial line  Intra-op Plan:   Post-operative Plan: Extubation in OR  Informed Consent: I have reviewed the patients History and Physical, chart, labs and discussed the procedure including the risks, benefits and alternatives for the proposed anesthesia with the patient or authorized representative who has indicated his/her understanding and acceptance.   Dental advisory given  Plan Discussed with: CRNA  Anesthesia Plan Comments: (remi gtt)        Anesthesia Quick Evaluation

## 2016-06-06 NOTE — H&P (View-Only) (Signed)
Vascular and Vein Specialist of [facility]  Patient name: Robert [name] MRN: [id number] DOB: 1935/06/04 Sex: male   REFERRING PROVIDER:    Dr. Erlinda Hong   REASON FOR CONSULT:    Carotid stenosis and CVA  HISTORY OF PRESENT ILLNESS:   Robert [name] is a 81 y.o. male, who Presented to the hospital on 05/20/2016 with slurred speech.  He was also felt to have a left facial droop.  The patient does have a history of a prior stroke with similar symptoms.  A noncontrast CT scan was negative for acute pathology.  He was not given TPA.  Carotid Doppler studies show 60-79% right carotid stenosis.  His MRI was positive for right-sided infarct.  He was hypertensive on arrival with blood pressure of 210/95.  The patient suffers from diabetes which has not been well controlled.  He takes a statin for hypercholesterolemia.  He is medically managed for hypertension.  He also suffers from stage IV renal insufficiency.  He has been evaluated for dialysis access in the past.  PAST MEDICAL HISTORY    Past Medical History:  Diagnosis Date  . Allergy    year round  . Cataract   . Diabetes mellitus   . GERD (gastroesophageal reflux disease)   . Hearing deficit   . Hyperlipidemia   . Hypertension   . Myocardial infarction 2011  . Stroke (cerebrum) ([place])   . Thyroid disease    hypothyroid     FAMILY HISTORY   Family History  Problem Relation Age of Onset  . Dementia Maternal Grandfather     SOCIAL HISTORY:   Social History   Social History  . Marital status: Married    Spouse name: N/A  . Number of children: N/A  . Years of education: N/A   Occupational History  . Not on file.   Social History Main Topics  . Smoking status: Former Research scientist (life sciences)  . Smokeless tobacco: Never Used  . Alcohol use 1.2 oz/week    2 Glasses of wine per week  . Drug use: No  . Sexual activity: Not on file   Other Topics Concern  . Not on file   Social History Narrative    . No narrative on file    ALLERGIES:    Allergies  Allergen Reactions  . Lisinopril Other (See Comments)    "made me lose my voice"    CURRENT MEDICATIONS:    Current Facility-Administered Medications  Medication Dose Route Frequency Provider Last Rate Last Dose  . acetaminophen (TYLENOL) tablet 650 mg  650 mg Oral Q4H PRN Vianne Bulls, MD       Or  . acetaminophen (TYLENOL) solution 650 mg  650 mg Per Tube Q4H PRN Vianne Bulls, MD       Or  . acetaminophen (TYLENOL) suppository 650 mg  650 mg Rectal Q4H PRN Ilene Qua Opyd, MD      . albuterol (PROVENTIL) (2.5 MG/3ML) 0.083% nebulizer solution 3 mL  3 mL Inhalation Q6H PRN Ilene Qua Opyd, MD      . aspirin suppository 300 mg  300 mg Rectal Daily Vianne Bulls, MD       Or  . aspirin tablet 325 mg  325 mg Oral Daily Vianne Bulls, MD   325 mg at 05/21/16 0902  . [START ON 05/22/2016] calcitRIOL (ROCALTROL) capsule 0.25 mcg  0.25 mcg Oral Q M,W,F Ilene Qua Opyd, MD      . ezetimibe (ZETIA) tablet 10 mg  10 mg Oral Daily Rosalin Hawking, MD      . heparin injection 5,000 Units  5,000 Units Subcutaneous Q8H Vianne Bulls, MD   5,000 Units at 05/21/16 0158  . insulin aspart (novoLOG) injection 0-15 Units  0-15 Units Subcutaneous TID WC Geradine Girt, DO   3 Units at 05/21/16 1250  . insulin aspart (novoLOG) injection 0-5 Units  0-5 Units Subcutaneous QHS Jessica U Vann, DO      . insulin glargine (LANTUS) injection 35 Units  35 Units Subcutaneous QHS Vianne Bulls, MD   35 Units at 05/21/16 0156  . labetalol (NORMODYNE,TRANDATE) injection 5-10 mg  5-10 mg Intravenous Q2H PRN Vianne Bulls, MD      . levothyroxine (SYNTHROID, LEVOTHROID) tablet 50 mcg  50 mcg Oral QAC breakfast Vianne Bulls, MD   50 mcg at 05/21/16 0902  . multivitamin with minerals tablet 1 tablet  1 tablet Oral Daily Vianne Bulls, MD   1 tablet at 05/21/16 0902  . pantoprazole (PROTONIX) EC tablet 40 mg  40 mg Oral Daily Vianne Bulls, MD   40 mg at 05/21/16  0902  . senna-docusate (Senokot-S) tablet 1 tablet  1 tablet Oral QHS PRN Vianne Bulls, MD      . simvastatin (ZOCOR) tablet 40 mg  40 mg Oral q1800 Vianne Bulls, MD        REVIEW OF SYSTEMS:   [X]  denotes positive finding, [ ]  denotes negative finding Cardiac  Comments:  Chest pain or chest pressure:    Shortness of breath upon exertion:    Short of breath when lying flat:    Irregular heart rhythm:        Vascular    Pain in calf, thigh, or hip brought on by ambulation:    Pain in feet at night that wakes you up from your sleep:     Blood clot in your veins:    Leg swelling:         Pulmonary    Oxygen at home:    Productive cough:     Wheezing:         Neurologic    Sudden weakness in arms or legs:     Sudden numbness in arms or legs:     Sudden onset of difficulty speaking or slurred speech: x   Temporary loss of vision in one eye:     Problems with dizziness:         Gastrointestinal    Blood in stool:      Vomited blood:         Genitourinary    Burning when urinating:     Blood in urine:        Psychiatric    Major depression:         Hematologic    Bleeding problems:    Problems with blood clotting too easily:        Skin    Rashes or ulcers:        Constitutional    Fever or chills:     PHYSICAL EXAM:   Vitals:   05/21/16 0300 05/21/16 0500 05/21/16 0657 05/21/16 0900  BP: (!) 198/77 (!) 160/52 (!) 160/80 (!) 160/73  Pulse: 79 68 72 70  Resp: 20 20 20 18   Temp:   98.6 F (37 C)   TempSrc:   Oral   SpO2: 96% 96% 96% 96%  Weight:      Height:  GENERAL: The patient is a well-nourished male, in no acute distress. The vital signs are documented above. CARDIAC: There is a regular rate and rhythm.  PULMONARY: Nonlabored respirations ABDOMEN: Soft and non-tender with normal pitched bowel sounds.  MUSCULOSKELETAL: There are no major deformities or cyanosis. NEUROLOGIC: No focal weakness or paresthesias are detected. SKIN: There are  no ulcers or rashes noted. PSYCHIATRIC: The patient has a normal affect.  STUDIES:   MRI: 1. Several subcentimeter scattered foci of acute/early subacute infarction are present within the right posterior frontal lobe inclusive of precentral gyrus and the right posterior temporal lobe. No associated hemorrhage or significant mass effect. 2. Stable background of chronic infarctions, moderate microvascular ischemic changes of white matter, and moderate brain parenchymal volume loss. 3. Mild diffuse paranasal sinus disease. Trace right mastoid effusion. 4. Patent circle of Willis without evidence for high-grade stenosis, large vessel occlusion, or aneurysm. These results will be called to the ordering clinician or representative by the Radiologist Assistant, and communication documented in the PACS or zVision Dashboard.  Carotid duplex: Right 60-79% ICA stenosis.  Left 1-39 percent stenosis  ASSESSMENT and PLAN   Right brain stroke in the setting of moderate right carotid stenosis: I discussed treatment options with the patient including stenting versus endarterectomy.  Because of his age as well as his renal disease, I feel that he is probably not a good candidate for stenting.  Before proceeding with endarterectomy, I would like to get a CT scan of the neck to evaluate the distal extent of the plaque, as pedal with chronic renal disease are prone to have disease that goes up to the base of the skull.  Unfortunately this will not be able to be with contrast because of his renal disease.  I did discuss the details of the operation including the risks and benefits which include but are not limited to the risk of stroke, nerve injury, cardiopulmonary complications.  Further recommendations will be made once the CT scan has been performed.  Unfortunately because of scheduling, his operation will likely not be able to be performed until next week or the following week.  He would like to go to a  grandson's baseball game tomorrow which from my perspective would be acceptable.  I will speak with his daughter later on tonight and follow up tomorrow   [name], MD Vascular and Vein Specialists of [facility] [phone] Pager [phone]

## 2016-06-06 NOTE — Interval H&P Note (Signed)
History and Physical Interval Note:  06/06/2016 7:25 AM  Robert [name]  has presented today for surgery, with the diagnosis of Right carotid artery stenosis I65.21  The various methods of treatment have been discussed with the patient and family. After consideration of risks, benefits and other options for treatment, the patient has consented to  Procedure(s): ENDARTERECTOMY CAROTID (Right) as a surgical intervention .  The patient's history has been reviewed, patient examined, no change in status, stable for surgery.  I have reviewed the patient's chart and labs.  Questions were answered to the patient's satisfaction.     Lucus Lambertson, [place]  No new sx's.  Has been at rehab.  Still with left hand weakness.  Risks and beneftis discussed again  [name]

## 2016-06-06 NOTE — Transfer of Care (Signed)
Immediate Anesthesia Transfer of Care Note  Patient: Robert [name]  Procedure(s) Performed: Procedure(s): RIGHT CAROTID ENDARTERECTOMY WITH PATCH ANGIOPLASTY (Right)  Patient Location: PACU  Anesthesia Type:General  Level of Consciousness: awake, alert , oriented and patient cooperative  Airway & Oxygen Therapy: Patient Spontanous Breathing and Patient connected to nasal cannula oxygen  Post-op Assessment: Report given to RN, Post -op Vital signs reviewed and stable, Patient moving all extremities, Patient moving all extremities X 4 and Patient able to stick tongue midline  Post vital signs: Reviewed and stable  Last Vitals:  Vitals:   06/06/16 0603 06/06/16 1130  BP: (!) 173/66   Pulse: 66   Resp: 20   Temp: 36.8 C (P) 36.7 C    Last Pain:  Vitals:   06/06/16 0603  TempSrc: Oral  PainSc: 0-No pain      Patients Stated Pain Goal: 4 (40/37/09 6438)  Complications: No apparent anesthesia complications

## 2016-06-06 NOTE — Consult Note (Signed)
Reason for Consult: Acute kidney injury on chronic kidney disease stage IV Referring Physician: [name] M.D. (Vascular Surgery)  HPI:  81 year old Caucasian man with past medical history significant for type 2 diabetes mellitus, hypertension, dyslipidemia, coronary artery disease status post PTCA/stent, prior CVA, chronic diastolic heart failure and progressive chronic kidney disease stage IV who follows up with Dr. Mercy Moore at [facility] (creatinine ~3.5). He was seen 2 weeks ago in consultation by Dr. Lorrene Reid with concerns of worsening renal function that was determined to be hemodynamically mediated versus progression of his chronic kidney disease. His discharge creatinine at that time was 4.2 following which he was sent to [facility] skilled nursing facility. Because of lack of their choice, he reports poor oral intake at that facility leading up to his labs yesterday that showed an even higher creatinine of 4.8. Today he underwent right carotid endarterectomy that appears to have been fairly uncomplicated. He denies any preceding nausea, vomiting or diarrhea. He was on furosemide prior to admission and denies taking any NSAIDs.   03/25/2016  05/20/2016  05/20/2016  05/22/2016  05/24/2016  06/05/2016   BUN 60 (H) 53 (H) 52 (H) 53 (H) 57 (H) 85 (H)  Creatinine 3.19 (H) 4.31 (H) 4.50 (H) 3.98 (H) 4.25 (H) 4.78 (H)   He appears to be fatigued and somewhat sleepy when seen in the PACU-his daughter offers most of the history.  Past Medical History:  Diagnosis Date  . Allergy    year round  . Cancer (HCC)    PSA ELEVATED  . Cataract   . Chronic kidney disease    DR MATTINGLY   . Diabetes mellitus   . Dyspnea   . GERD (gastroesophageal reflux disease)   . Hearing deficit   . History of kidney stones   . Hyperlipidemia   . Hypertension   . Myocardial infarction 2011  . Stroke (cerebrum) ([place])   . Thyroid disease    hypothyroid    Past Surgical History:  Procedure Laterality Date  . CORONARY  ANGIOPLASTY WITH STENT PLACEMENT  07/2009   3 stents  . ELBOW SURGERY     LEFT  . EP IMPLANTABLE DEVICE N/A 05/16/2015   Procedure: Loop Recorder Insertion;  Surgeon: Thompson Grayer, MD;  Location: [facility];  Service: Cardiovascular;  Laterality: N/A;  . ESOPHAGOSCOPY WITH DILITATION    . insulin pump needs to be removed for mri    . NOSE SURGERY    . stents    . TEE WITHOUT CARDIOVERSION N/A 05/16/2015   Procedure: TRANSESOPHAGEAL ECHOCARDIOGRAM (TEE);  Surgeon: Thayer Headings, MD;  Location: [facility];  Service: Cardiovascular;  Laterality: N/A;    Family History  Problem Relation Age of Onset  . Dementia Maternal Grandfather     Social History:  reports that he has quit smoking. He has never used smokeless tobacco. He reports that he drinks about 1.2 oz of alcohol per week . He reports that he does not use drugs.  Allergies:  Allergies  Allergen Reactions  . Lisinopril Other (See Comments)    "made me lose my voice"    Medications:  Scheduled: . Chlorhexidine Gluconate Cloth  6 each Topical Once   And  . Chlorhexidine Gluconate Cloth  6 each Topical Once  . HYDROmorphone      . labetalol        BMP Latest Ref Rng & Units 06/05/2016 05/24/2016 05/22/2016  Glucose 65 - 99 mg/dL 227(H) 101(H) 119(H)  BUN 6 - 20  mg/dL 85(H) 57(H) 53(H)  Creatinine 0.61 - 1.24 mg/dL 4.78(H) 4.25(H) 3.98(H)  BUN/Creat Ratio 10 - 24 - - -  Sodium 135 - 145 mmol/L 137 140 141  Potassium 3.5 - 5.1 mmol/L 4.7 4.1 4.1  Chloride 101 - 111 mmol/L 106 106 110  CO2 22 - 32 mmol/L 20(L) 24 22  Calcium 8.9 - 10.3 mg/dL 9.3 8.8(L) 8.2(L)   CBC Latest Ref Rng & Units 06/05/2016 05/24/2016 05/22/2016  WBC 4.0 - 10.5 K/uL 7.7 7.2 6.6  Hemoglobin 13.0 - 17.0 g/dL 12.7(L) 11.1(L) 10.8(L)  Hematocrit 39.0 - 52.0 % 39.0 34.1(L) 34.1(L)  Platelets 150 - 400 K/uL 205 136(L) 150     No results found.  Review of Systems  Unable to perform ROS: Other (Patient somnolent and fatigued when seen in  PACU)   Blood pressure (!) 108/42, pulse 62, temperature 98 F (36.7 C), resp. rate 18, height 5\' 8"  (1.727 m), weight 85.3 kg (188 lb), SpO2 99 %. Physical Exam  Nursing note and vitals reviewed. Constitutional: He appears well-developed and well-nourished.  Appears somnolent and somewhat uncomfortable  HENT:  Head: Normocephalic and atraumatic.  Dry oral mucosa  Eyes: EOM are normal. Pupils are equal, round, and reactive to light.  Neck: Neck supple. No JVD present.  Right neck scar status post CEA  Respiratory: Effort normal and breath sounds normal. He has no wheezes. He has no rales.  GI: Soft. Bowel sounds are normal. There is no tenderness. There is no rebound and no guarding.  Musculoskeletal: He exhibits edema.  Trace ankle edema  Neurological:  Somnolent and fatigued. Difficult to assess orientation.  Skin: Skin is warm and dry. No rash noted. No erythema.    Assessment/Plan: 1. Acute kidney injury on chronic kidney disease stage IV versus progression of chronic kidney disease: Suspect in part might be hemodynamically mediated given the admission of poor oral intake as well as corresponding rise of BUN/creatinine since his admission to the skilled nursing facility. Will check labs including urine electrolytes and give gentle intravenous fluids as we build up to satisfactory oral intake. No acute electrolyte abnormality appreciated to prompt intervention and he does not have any current uremic signs or symptoms. We'll continue to follow him closely to help ongoing management. 2. Status post right carotid endarterectomy 3. Hypertension: Improving blood pressure trend noted postoperatively, monitor for further need to adjust antihypertensive agents. 4. Anemia of chronic kidney disease: Hemoglobin currently appears to be at goal 5. Chronic diastolic heart failure: Recommend holding furosemide while undertaking intravascular volume expansion  Marlisha Vanwyk K. 06/06/2016, 1:41 PM

## 2016-06-06 NOTE — Anesthesia Procedure Notes (Signed)
Procedure Name: Intubation Date/Time: 06/06/2016 7:57 AM Performed by: Izora Gala Pre-anesthesia Checklist: Patient identified, Emergency Drugs available, Suction available and Patient being monitored Patient Re-evaluated:Patient Re-evaluated prior to inductionOxygen Delivery Method: Circle system utilized Preoxygenation: Pre-oxygenation with 100% oxygen Intubation Type: IV induction Ventilation: Mask ventilation without difficulty and Oral airway inserted - appropriate to patient size Laryngoscope Size: Miller and 3 Grade View: Grade II Tube type: Oral Tube size: 7.5 mm Number of attempts: 1 Airway Equipment and Method: Stylet Dental Injury: Teeth and Oropharynx as per pre-operative assessment

## 2016-06-06 NOTE — Progress Notes (Signed)
  Vascular and Vein Specialists Day of Surgery Note  Subjective:  Wants to sit up.   Vitals:   06/06/16 1145 06/06/16 1150  BP: (!) 170/72 (!) 175/79  Pulse: 72 67  Resp: 14 18  Temp:     Right neck without hematoma. Drain with scant output. Tongue midline, no smile asymmetry.  Moving all extremities equally  Assessment/Plan:  This is a 81 y.o. male who is s/p right carotid endarterectomy  Stable post-op.  Neuro exam intact. Does have some left grip weakness Renal to see regarding CKD.    Virgina Jock, [place] Pager: [phone] 06/06/2016 12:01 PM

## 2016-06-07 ENCOUNTER — Observation Stay (HOSPITAL_COMMUNITY): Payer: Medicare Other

## 2016-06-07 ENCOUNTER — Encounter (HOSPITAL_COMMUNITY): Payer: Self-pay | Admitting: Surgery

## 2016-06-07 ENCOUNTER — Telehealth: Payer: Self-pay | Admitting: Surgery

## 2016-06-07 DIAGNOSIS — I639 Cerebral infarction, unspecified: Secondary | ICD-10-CM | POA: Diagnosis not present

## 2016-06-07 DIAGNOSIS — I6521 Occlusion and stenosis of right carotid artery: Secondary | ICD-10-CM | POA: Diagnosis present

## 2016-06-07 DIAGNOSIS — E7800 Pure hypercholesterolemia, unspecified: Secondary | ICD-10-CM | POA: Diagnosis present

## 2016-06-07 DIAGNOSIS — I69354 Hemiplegia and hemiparesis following cerebral infarction affecting left non-dominant side: Secondary | ICD-10-CM | POA: Diagnosis not present

## 2016-06-07 DIAGNOSIS — D631 Anemia in chronic kidney disease: Secondary | ICD-10-CM | POA: Diagnosis present

## 2016-06-07 DIAGNOSIS — I2510 Atherosclerotic heart disease of native coronary artery without angina pectoris: Secondary | ICD-10-CM | POA: Diagnosis present

## 2016-06-07 DIAGNOSIS — K219 Gastro-esophageal reflux disease without esophagitis: Secondary | ICD-10-CM | POA: Diagnosis present

## 2016-06-07 DIAGNOSIS — E1151 Type 2 diabetes mellitus with diabetic peripheral angiopathy without gangrene: Secondary | ICD-10-CM | POA: Diagnosis present

## 2016-06-07 DIAGNOSIS — N179 Acute kidney failure, unspecified: Secondary | ICD-10-CM | POA: Diagnosis present

## 2016-06-07 DIAGNOSIS — E785 Hyperlipidemia, unspecified: Secondary | ICD-10-CM | POA: Diagnosis present

## 2016-06-07 DIAGNOSIS — Z794 Long term (current) use of insulin: Secondary | ICD-10-CM | POA: Diagnosis not present

## 2016-06-07 DIAGNOSIS — Z7982 Long term (current) use of aspirin: Secondary | ICD-10-CM | POA: Diagnosis not present

## 2016-06-07 DIAGNOSIS — E1122 Type 2 diabetes mellitus with diabetic chronic kidney disease: Secondary | ICD-10-CM | POA: Diagnosis present

## 2016-06-07 DIAGNOSIS — Z87891 Personal history of nicotine dependence: Secondary | ICD-10-CM | POA: Diagnosis not present

## 2016-06-07 DIAGNOSIS — Z955 Presence of coronary angioplasty implant and graft: Secondary | ICD-10-CM | POA: Diagnosis not present

## 2016-06-07 DIAGNOSIS — I252 Old myocardial infarction: Secondary | ICD-10-CM | POA: Diagnosis not present

## 2016-06-07 DIAGNOSIS — I5032 Chronic diastolic (congestive) heart failure: Secondary | ICD-10-CM | POA: Diagnosis present

## 2016-06-07 DIAGNOSIS — I809 Phlebitis and thrombophlebitis of unspecified site: Secondary | ICD-10-CM | POA: Diagnosis not present

## 2016-06-07 DIAGNOSIS — I130 Hypertensive heart and chronic kidney disease with heart failure and stage 1 through stage 4 chronic kidney disease, or unspecified chronic kidney disease: Secondary | ICD-10-CM | POA: Diagnosis present

## 2016-06-07 DIAGNOSIS — N184 Chronic kidney disease, stage 4 (severe): Secondary | ICD-10-CM | POA: Diagnosis present

## 2016-06-07 LAB — UREA NITROGEN, URINE: Urea Nitrogen, Ur: 651 mg/dL

## 2016-06-07 LAB — RENAL FUNCTION PANEL
ALBUMIN: 2.8 g/dL — AB (ref 3.5–5.0)
Anion gap: 8 (ref 5–15)
BUN: 77 mg/dL — ABNORMAL HIGH (ref 6–20)
CALCIUM: 8.6 mg/dL — AB (ref 8.9–10.3)
CO2: 21 mmol/L — AB (ref 22–32)
CREATININE: 4.16 mg/dL — AB (ref 0.61–1.24)
Chloride: 110 mmol/L (ref 101–111)
GFR calc non Af Amer: 12 mL/min — ABNORMAL LOW (ref >60)
GFR, EST AFRICAN AMERICAN: 14 mL/min — AB (ref >60)
GLUCOSE: 164 mg/dL — AB (ref 65–99)
PHOSPHORUS: 4.5 mg/dL (ref 2.5–4.6)
Potassium: 4.2 mmol/L (ref 3.5–5.1)
SODIUM: 139 mmol/L (ref 135–145)

## 2016-06-07 LAB — GLUCOSE, CAPILLARY
GLUCOSE-CAPILLARY: 161 mg/dL — AB (ref 65–99)
Glucose-Capillary: 156 mg/dL — ABNORMAL HIGH (ref 65–99)
Glucose-Capillary: 158 mg/dL — ABNORMAL HIGH (ref 65–99)
Glucose-Capillary: 103 mg/dL — ABNORMAL HIGH (ref 65–99)

## 2016-06-07 LAB — CBC
HCT: 27.6 % — ABNORMAL LOW (ref 39.0–52.0)
Hemoglobin: 8.9 g/dL — ABNORMAL LOW (ref 13.0–17.0)
MCH: 28.2 pg (ref 26.0–34.0)
MCHC: 32.2 g/dL (ref 30.0–36.0)
MCV: 87.3 fL (ref 78.0–100.0)
PLATELETS: 134 10*3/uL — AB (ref 150–400)
RBC: 3.16 MIL/uL — AB (ref 4.22–5.81)
RDW: 15 % (ref 11.5–15.5)
WBC: 9.9 10*3/uL (ref 4.0–10.5)

## 2016-06-07 MED ORDER — OXYCODONE-ACETAMINOPHEN 5-325 MG PO TABS
1.0000 | ORAL_TABLET | Freq: Four times a day (QID) | ORAL | 0 refills | Status: DC | PRN
Start: 2016-06-07 — End: 2016-07-25

## 2016-06-07 NOTE — Progress Notes (Addendum)
  Vascular and Vein Specialists Progress Note  Subjective  - POD #1  Had a "long night." Complained of some swallowing trouble yesterday but no choking. Per RN, patient had difficulty with pills initially since he was given 5 pills at a time. He did fine with one pill at a time. Says his voice is more hoarse.   Objective Vitals:   06/06/16 2246 06/07/16 0309  BP: (!) 157/57 (!) 157/51  Pulse: 70 78  Resp: 17 (!) 28  Temp: 98.1 F (36.7 C) 98.4 F (36.9 C)    Intake/Output Summary (Last 24 hours) at 06/07/16 0737 Last data filed at 06/07/16 0400  Gross per 24 hour  Intake          3290.42 ml  Output             1387 ml  Net          1903.42 ml   Lungs clear bilaterally.  Right neck without hematoma. JP drain clot in tubing and minimal clot in bulb. No facial droop or tongue deviation. Mild dysarthria. Residual weak left hand grip. Moving all extremities equally.  Some hoarseness in voice.   Assessment/Planning: 81 y.o. male is s/p: right carotid endarterectomy for symptomatic right carotid stenosis 1 Day Post-Op   Some voice hoarseness this am. Mild dysarthria with residual left grip weakness from prior CVA.   D/c drain.  Will advance diet and see if he tolerates.  Ambulate patient AKI on CKD: creatinine improved today with IVF. Appreciate nephrology consult.  Likely d/c to SNF today if patient able to tolerate breakfast.   Robert [name] 06/07/2016 7:37 AM --  Laboratory CBC    Component Value Date/Time   WBC 9.9 06/07/2016 0348   HGB 8.9 (L) 06/07/2016 0348   HCT 27.6 (L) 06/07/2016 0348   PLT 134 (L) 06/07/2016 0348    BMET    Component Value Date/Time   NA 139 06/07/2016 0348   NA 144 03/25/2016 1335   K 4.2 06/07/2016 0348   CL 110 06/07/2016 0348   CO2 21 (L) 06/07/2016 0348   GLUCOSE 164 (H) 06/07/2016 0348   BUN 77 (H) 06/07/2016 0348   BUN 60 (H) 03/25/2016 1335   CREATININE 4.16 (H) 06/07/2016 0348   CALCIUM 8.6 (L) 06/07/2016 0348   GFRNONAA 12 (L) 06/07/2016 0348   GFRAA 14 (L) 06/07/2016 0348    COAG Lab Results  Component Value Date   INR 0.95 06/05/2016   INR 0.99 05/20/2016   INR 0.98 05/13/2015   No results found for: PTT  Antibiotics Anti-infectives    Start     Dose/Rate Route Frequency Ordered Stop   06/06/16 2000  cefUROXime (ZINACEF) 1.5 g in dextrose 5 % 50 mL IVPB     1.5 g 100 mL/hr over 30 Minutes Intravenous Every 12 hours 06/06/16 1536 06/07/16 1959   06/06/16 0545  cefUROXime (ZINACEF) 1.5 g in dextrose 5 % 50 mL IVPB     1.5 g 100 mL/hr over 30 Minutes Intravenous 30 min pre-op 06/06/16 0545 06/06/16 0810       Robert Jock, Robert [name] [facility]ists Office: [phone] Pager: [phone] 06/07/2016 7:37 AM    Has not eaten this am.  Had some trouble with pills last night Scant drain out put.  Will d/c  If does not tolerate breakfast, will need swallow eval.  If he does tolerate it, he can be d/c'd  [name]

## 2016-06-07 NOTE — Progress Notes (Signed)
Assessment/Plan: 1. Acute kidney injury on chronic kidney disease stage IV, improved 2. Status post right carotid endarterectomy 3. Hypertension  5. Chronic diastolic heart failure: Resume diuretic 4/14 or 15  Subjective: Interval History: Stuffy nose  Objective: Vital signs in last 24 hours: Temp:  [97.7 F (36.5 C)-98.9 F (37.2 C)] 98.9 F (37.2 C) (04/13 1221) Pulse Rate:  [60-85] 70 (04/13 1221) Resp:  [8-28] 16 (04/13 1221) BP: (126-157)/(40-68) 154/57 (04/13 1221) SpO2:  [93 %-100 %] 97 % (04/13 1221) Arterial Line BP: (146-172)/(42-53) 171/45 (04/12 1459) Weight:  [86.7 kg (191 lb 2.2 oz)] 86.7 kg (191 lb 2.2 oz) (04/13 0500) Weight change: 1.424 kg (3 lb 2.2 oz)  Intake/Output from previous day: 04/12 0701 - 04/13 0700 In: 3290.4 [P.O.:360; I.V.:2430.4; IV Piggyback:500] Out: 1587 [Urine:1025; Drains:2; Blood:560] Intake/Output this shift: Total I/O In: 220 [P.O.:120; IV Piggyback:100] Out: 800 [Urine:800]  General appearance: alert and cooperative Resp: wheezes scattered Cardio: regular rate and rhythm, S1, S2 normal, no murmur, click, rub or gallop GI: soft, non-tender; bowel sounds normal; no masses,  no organomegaly  Right neck CEA scar  Lab Results:  Recent Labs  06/06/16 1550 06/07/16 0348  WBC 8.8 9.9  HGB 9.2* 8.9*  HCT 27.9* 27.6*  PLT 148* 134*   BMET:  Recent Labs  06/05/16 1552 06/06/16 1550 06/07/16 0348  NA 137  --  139  K 4.7  --  4.2  CL 106  --  110  CO2 20*  --  21*  GLUCOSE 227*  --  164*  BUN 85*  --  77*  CREATININE 4.78* 4.38* 4.16*  CALCIUM 9.3  --  8.6*   No results for input(s): PTH in the last 72 hours. Iron Studies: No results for input(s): IRON, TIBC, TRANSFERRIN, FERRITIN in the last 72 hours. Studies/Results: No results found.  Scheduled: . aspirin  325 mg Oral Daily  . calcitRIOL  0.25 mcg Oral Q M,W,F  . clopidogrel  75 mg Oral Daily  . docusate sodium  100 mg Oral Daily  . ezetimibe  10 mg Oral Daily   . heparin  5,000 Units Subcutaneous Q8H  . hydrALAZINE  100 mg Oral TID  . insulin aspart  0-15 Units Subcutaneous TID WC  . insulin glargine  35 Units Subcutaneous QHS  . levothyroxine  50 mcg Oral QAC breakfast  . metoprolol succinate  50 mg Oral BID  . multivitamin with minerals  1 tablet Oral Daily  . pantoprazole  40 mg Oral Daily  . simvastatin  40 mg Oral q1800     LOS: 1 day   Rifka Ramey C 06/07/2016,1:22 PM

## 2016-06-07 NOTE — Telephone Encounter (Signed)
-----   Message from Mena Goes, RN sent at 06/07/2016  9:57 AM EDT ----- Regarding: 2-3 weeks   ----- Message ----- From: Alvia Grove, PA-C Sent: 06/07/2016   7:48 AM To: Vvs Charge Pool  s/p right CEA 06/06/16  f/u with Dr. Trula Slade in 2 weeks  Thanks Maudie Mercury

## 2016-06-07 NOTE — Progress Notes (Signed)
Patient experiencing coughing and difficulty swallowing with oral medications.  Contacted PA Trihn received an order for a swallow eval.  Will continue to follow up.

## 2016-06-07 NOTE — Discharge Summary (Signed)
Discharge Summary     Robert [name] [birth year] 81 y.o. male  [id number]  Admission Date: 06/06/2016  Discharge Date: 06/10/16  Physician: Serafina Mitchell, MD  Admission Diagnosis: Right carotid artery stenosis I65.21   HPI:   This is a 81 y.o. male who Presented to the hospital on 05/20/2016 with slurred speech.  He was also felt to have a left facial droop.  The patient does have a history of a prior stroke with similar symptoms.  A noncontrast CT scan was negative for acute pathology.  He was not given TPA.  Carotid Doppler studies show 60-79% right carotid stenosis.  His MRI was positive for right-sided infarct.  He was hypertensive on arrival with blood pressure of 210/95.  The patient suffers from diabetes which has not been well controlled.  He takes a statin for hypercholesterolemia.  He is medically managed for hypertension.  He also suffers from stage IV renal insufficiency.  He has been evaluated for dialysis access in the past.  Hospital Course:  The patient was admitted to the hospital and taken to the operating room on 06/06/2016 and underwent right carotid endarterectomy.  The pt tolerated the procedure well and was transported to the PACU in good condition.   By POD 1, the pt neuro status was in tact with exception of some residual left hand weakness.  His JP drain was discontinued.  He did have AKI on CKD that did improve with IVF.  He did have some hoarseness and a swallow evaluation was ordered.      SLP Diet Recommendations: Dysphagia 1 (Puree) solids;Thin liquid   Liquid Administration via: Cup;Straw   Medication Administration: Whole meds with puree   Supervision: Patient able to self feed;Intermittent supervision to cue for compensatory strategies   Compensations: Slow rate;Small sips/bites;Multiple dry swallows after each bite/sip;Follow solids with liquid   Postural Changes: Seated upright at 90 degrees   Oral Care Recommendations: Oral  care BID  ENT consult recommended by speech.  Dr. Trula Slade feels since he just had an acute stroke and surgery, will defer consult for a couple of weeks until he is seen back in the office.    Recent Labs  06/05/16 1552 06/07/16 0348  NA 137 139  K 4.7 4.2  CL 106 110  CO2 20* 21*  GLUCOSE 227* 164*  BUN 85* 77*  CALCIUM 9.3 8.6*    Recent Labs  06/06/16 1550 06/07/16 0348  WBC 8.8 9.9  HGB 9.2* 8.9*  HCT 27.9* 27.6*  PLT 148* 134*    Recent Labs  06/05/16 1552  INR 0.95     Discharge Instructions    CAROTID Sugery: Call MD for difficulty swallowing or speaking; weakness in arms or legs that is a new symtom; severe headache.  If you have increased swelling in the neck and/or  are having difficulty breathing, CALL 911    Complete by:  As directed    Call MD for:  redness, tenderness, or signs of infection (pain, swelling, bleeding, redness, odor or green/yellow discharge around incision site)    Complete by:  As directed    Call MD for:  severe or increased pain, loss or decreased feeling  in affected limb(s)    Complete by:  As directed    Call MD for:  temperature >100.5    Complete by:  As directed    Discharge wound care:    Complete by:  As directed    Wash wound daily with soap and  water and pat dry. Do not apply any creams or ointments on your incisions. Do not pull off the skin glue. It will peel off on its own.   Driving Restrictions    Complete by:  As directed    No driving until cleared by physician   Increase activity slowly    Complete by:  As directed    Walk with assistance use walker or cane as needed   Lifting restrictions    Complete by:  As directed    No lifting for 2 weeks   Resume previous diet    Complete by:  As directed       Discharge Diagnosis:  Right carotid artery stenosis I65.21  Secondary Diagnosis: Patient Active Problem List   Diagnosis Date Noted  . Symptomatic carotid artery stenosis, right 06/06/2016  . Carotid  stenosis, right 05/21/2016  . AKI (acute kidney injury) ([place]) 05/20/2016  . Dysarthria 05/20/2016  . Thrombocytopenia ([place]) 05/20/2016  . Chronic diastolic CHF (congestive heart failure) ([place]) 05/20/2016  . Slurred speech   . Chronic kidney disease (CKD), stage IV (severe) ([place]) 03/20/2016  . Stroke (cerebrum) ([place])   . History of stroke 05/13/2015  . Acute encephalopathy 05/13/2015  . Hypertensive urgency 05/13/2015  . Hyperlipidemia 05/13/2015  . Diabetes mellitus, type II, insulin dependent ([place]) 08/18/2009  . Anemia 08/18/2009  . Essential hypertension 08/18/2009  . ALLERGIC RHINITIS, SEASONAL 08/18/2009  . PARAPHARYNGEAL ABSCESS 08/18/2009  . GERD 08/18/2009  . HIATAL HERNIA 08/18/2009  . DIVERTICULOSIS, COLON 08/18/2009  . 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 ARTERIOVENOUS MALFORMATION, COLON 08/18/2009  . DYSPHAGIA 08/18/2009  . COLONIC POLYPS, HX OF 08/18/2009  . RENAL CALCULUS, HX OF 08/18/2009  . ESOPHAGEAL STRICTURE 06/21/2009   Past Medical History:  Diagnosis Date  . Allergy    year round  . Cancer (HCC)    PSA ELEVATED  . Cataract   . Chronic kidney disease    DR MATTINGLY   . Diabetes mellitus   . Dyspnea   . GERD (gastroesophageal reflux disease)   . Hearing deficit   . History of kidney stones   . Hyperlipidemia   . Hypertension   . Myocardial infarction 2011  . Stroke (cerebrum) ([place])   . Thyroid disease    hypothyroid    Allergies as of 06/07/2016      Reactions   Lisinopril Other (See Comments)   "made me lose my voice"      Medication List    TAKE these medications   albuterol 108 (90 Base) MCG/ACT inhaler Commonly known as:  PROVENTIL HFA;VENTOLIN HFA Inhale 2 puffs into the lungs every 6 (six) hours as needed for wheezing or shortness of breath.   aspirin 325 MG EC tablet Take 325 mg by mouth daily.   calcitRIOL 0.25 MCG capsule Commonly known as:  ROCALTROL Take 0.25 mcg by mouth every Monday, Wednesday,  and Friday.   clopidogrel 75 MG tablet Commonly known as:  PLAVIX Take 1 tablet (75 mg total) by mouth daily.   ezetimibe 10 MG tablet Commonly known as:  ZETIA Take 1 tablet (10 mg total) by mouth daily.   furosemide 40 MG tablet Commonly known as:  LASIX Take 40 mg by mouth 2 (two) times daily.   hydrALAZINE 100 MG tablet Commonly known as:  APRESOLINE Take 100 mg by mouth 3 (three) times daily.   insulin aspart 100 UNIT/ML injection Commonly known as:  novoLOG CBG < 70: implement hypoglycemia protocol  CBG 70 -  120: 0 units  CBG 121 - 150: 2 units  CBG 151 - 200: 3 units  CBG 201 - 250: 5 units  CBG 251 - 300: 8 units  CBG 301 - 350: 11 units  CBG 351 - 400: 15 units  CBG > 400 call MD and obtain STAT lab verification   insulin glargine 100 UNIT/ML injection Commonly known as:  LANTUS Inject 0.35 mLs (35 Units total) into the skin at bedtime.   levothyroxine 50 MCG tablet Commonly known as:  SYNTHROID, LEVOTHROID Take 50 mcg by mouth daily before breakfast.   metoprolol succinate 50 MG 24 hr tablet Commonly known as:  TOPROL-XL Take 50 mg by mouth 2 (two) times daily.   multivitamin with minerals tablet Take 1 tablet by mouth daily.   omeprazole 20 MG capsule Commonly known as:  PRILOSEC Take 1 capsule by mouth Daily.   oxyCODONE-acetaminophen 5-325 MG tablet Commonly known as:  PERCOCET/ROXICET Take 1-2 tablets by mouth every 6 (six) hours as needed for moderate pain.   simvastatin 40 MG tablet Commonly known as:  ZOCOR Take 1 tablet (40 mg total) by mouth daily at 6 PM.       Prescriptions given: Roxicet #8 No Refill  Instructions: 1.  No driving x 2 weeks & while taking pain medication 2.  No heavy lifting x 2 weeks 3.  Shower daily with soap and water starting 06/10/16 4.  Diet:  SLP Diet Recommendations: Dysphagia 1 (Puree) solids;Thin liquid   Liquid Administration via: Cup;Straw   Medication Administration: Whole meds with puree    Supervision: Patient able to self feed;Intermittent supervision to cue for compensatory strategies   Compensations: Slow rate;Small sips/bites;Multiple dry swallows after each bite/sip;Follow solids with liquid   Postural Changes: Seated upright at 90 degrees   Oral Care Recommendations: Oral care BID  5.  Will need ENT consult in a couple of weeks after seeing Dr. Trula Slade in the office.   Disposition: SNF  Patient's condition: is Good  Follow up: 1. Dr. Trula Slade in 2 weeks.   Leontine Locket, PA-C [facility] [phone]   --- For [facility] use ---   Modified Rankin score at D/C (0-6): 2  IV medication needed for:  1. Hypertension: No 2. Hypotension: No  Post-op Complications: Yes  1. Post-op CVA or TIA: No  If yes: Event classification (right eye, left eye, right cortical, left cortical, verterobasilar, other): n/a  If yes: Timing of event (intra-op, <6 hrs post-op, >=6 hrs post-op, unknown): n/a  2. CN injury: Yes  If yes: CN 12 injuried   3. Myocardial infarction: No  If yes: Dx by (EKG or clinical, Troponin): n/a  4.  CHF: No  5.  Dysrhythmia (new): No  6. Wound infection: No  7. Reperfusion symptoms: No  8. Return to OR: No  If yes: return to OR for (bleeding, neurologic, other CEA incision, other): n/a  Discharge medications: Statin use:  Yes   If No:   ASA use:  Yes  If No:   Beta blocker use:  Yes ACE-Inhibitor use:  No  ARB use:  No CCB use: No P2Y12 Antagonist use: Yes, [x]  Plavix, [ ]  Plasugrel, [ ]  Ticlopinine, [ ]  Ticagrelor, [ ]  Other, [ ]  No for medical reason, [ ]  Non-compliant, [ ]  Not-indicated Anti-coagulant use:  No, [ ]  Warfarin, [ ]  Rivaroxaban, [ ]  Dabigatran,

## 2016-06-07 NOTE — Evaluation (Signed)
Physical Therapy Evaluation Patient Details Name: Robert [name] MRN: [id number] DOB: Aug 28, 1935 Today's Date: 06/07/2016   History of Present Illness  Pt is a 81 y/o male s/p R carotid endarterectomy. Of note, pt recently admitted on 3/26 secondary to R brain CVA. He was d/c'd on 3/30 to [facility]. PMH including but not limited to HTN, DM, CHF, CKD stage IV and hx of cryptogenic CVA.  Clinical Impression  Pt presented supine in bed with HOB elevated, awake and willing to participate in therapy session. Prior to admission, pt was at [facility] for rehab. He stated that he was working with PT everyday on "balance and walking". Pt stated that he was ambulating unassisted and required assistance with ADLs. Pt currently requires min A for stability with transfers and for safety and stability with ambulation without an AD. All VSS throughout. Pt would continue to benefit from skilled physical therapy services at this time while admitted and after d/c to address the below listed limitations in order to improve overall safety and independence with functional mobility.     Follow Up Recommendations SNF;Other (comment) (return to [facility])    Equipment Recommendations  None recommended by PT;Other (comment) (defer to next venue)    Recommendations for Other Services       Precautions / Restrictions Precautions Precautions: Fall Precaution Comments: decreased L side awareness ?inattention Restrictions Weight Bearing Restrictions: No      Mobility  Bed Mobility Overal bed mobility: Needs Assistance Bed Mobility: Supine to Sit;Sit to Supine     Supine to sit: Min guard Sit to supine: Min guard   General bed mobility comments: increased time, use of bed rails, min guard for safety  Transfers Overall transfer level: Needs assistance Equipment used: None Transfers: Sit to/from Stand Sit to Stand: Min assist         General transfer comment: min A for stability with transition into standing  from bed  Ambulation/Gait Ambulation/Gait assistance: Min assist Ambulation Distance (Feet): 75 Feet Assistive device: None Gait Pattern/deviations: Step-through pattern;Decreased stride length;Shuffle Gait velocity: decreased Gait velocity interpretation: Below normal speed for age/gender General Gait Details: min A for stability. pt required frequent verbal cues secondary to his decreased awareness of objects on his L side, frequently bumping into things on his L  Stairs            Wheelchair Mobility    Modified Rankin (Stroke Patients Only)       Balance Overall balance assessment: Needs assistance Sitting-balance support: No upper extremity supported;Feet supported Sitting balance-Leahy Scale: Good     Standing balance support: During functional activity;No upper extremity supported Standing balance-Leahy Scale: Fair                               Pertinent Vitals/Pain Pain Assessment: No/denies pain    Home Living Family/patient expects to be discharged to:: Skilled nursing facility                 Additional Comments: plan to return to [facility]    Prior Function Level of Independence: Needs assistance   Gait / Transfers Assistance Needed: pt reported that he ambulated unassisted and was working with PT at [facility] on "balance and walking"  ADL's / Homemaking Assistance Needed: needed assistance for bathing and dressing        Hand Dominance   Dominant Hand: Right    Extremity/Trunk Assessment   Upper Extremity Assessment Upper Extremity Assessment:  LUE deficits/detail LUE Deficits / Details: decreased functional strength as compared to R UE. pt with reported sensation intact to light touch LUE Coordination: decreased fine motor;decreased gross motor    Lower Extremity Assessment Lower Extremity Assessment: Generalized weakness       Communication   Communication: HOH  Cognition Arousal/Alertness: Awake/alert Behavior During  Therapy: Flat affect Overall Cognitive Status: Impaired/Different from baseline Area of Impairment: Safety/judgement;Problem solving;Following commands                       Following Commands: Follows one step commands with increased time Safety/Judgement: Decreased awareness of safety   Problem Solving: Slow processing;Decreased initiation;Difficulty sequencing;Requires verbal cues;Requires tactile cues        General Comments      Exercises     Assessment/Plan    PT Assessment Patient needs continued PT services  PT Problem List Decreased mobility;Decreased strength;Decreased safety awareness;Decreased coordination;Decreased cognition;Decreased activity tolerance;Decreased balance;Decreased knowledge of use of DME;Impaired sensation       PT Treatment Interventions DME instruction;Gait training;Balance training;Functional mobility training;Neuromuscular re-education;Stair training;Therapeutic activities;Patient/family education;Therapeutic exercise    PT Goals (Current goals can be found in the Care Plan section)  Acute Rehab PT Goals Patient Stated Goal: return to [facility] PT Goal Formulation: With patient Time For Goal Achievement: 06/21/16 Potential to Achieve Goals: Good    Frequency Min 3X/week   Barriers to discharge Decreased caregiver support      Co-evaluation               End of Session Equipment Utilized During Treatment: Gait belt Activity Tolerance: Patient tolerated treatment well Patient left: in bed;with call bell/phone within reach;with bed alarm set Nurse Communication: Mobility status PT Visit Diagnosis: Other abnormalities of gait and mobility (R26.89)    Time: 7062-3762 PT Time Calculation (min) (ACUTE ONLY): 17 min   Charges:   PT Evaluation $PT Eval Moderate Complexity: 1 Procedure     PT G Codes:   PT G-Codes **NOT FOR INPATIENT CLASS** Functional Assessment Tool Used: AM-PAC 6 Clicks Basic Mobility;Clinical  judgement Functional Limitation: Mobility: Walking and moving around Mobility: Walking and Moving Around Current Status (G3151): At least 20 percent but less than 40 percent impaired, limited or restricted Mobility: Walking and Moving Around Goal Status [phone]): At least 1 percent but less than 20 percent impaired, limited or restricted    [facility], [place], DPT [place] 06/07/2016, 2:12 PM

## 2016-06-07 NOTE — Care Management Note (Signed)
Case Management Note  Patient Details  Name: Robert [name] MRN: [id number] Date of Birth: 08-07-1935  Subjective/Objective:  From [facility] [place], pod 1 CEA, having problems with swallowing.  Plan is for dc back to SNF when medically ready.                  Action/Plan:   Expected Discharge Date:                  Expected Discharge Plan:  Skilled Nursing Facility  In-House Referral:  Clinical Social Work  Discharge planning Services  CM Consult  Post Acute Care Choice:    Choice offered to:     DME Arranged:    DME Agency:     HH Arranged:    [place] Agency:     Status of Service:  Completed, signed off  If discussed at [employer], dates discussed:    Additional Comments:  Zenon Mayo, RN 06/07/2016, 5:49 PM

## 2016-06-07 NOTE — Telephone Encounter (Signed)
Sched appt 06/26/16 at 11:30. Lm on cell# to inform pt.

## 2016-06-08 ENCOUNTER — Inpatient Hospital Stay (HOSPITAL_COMMUNITY): Payer: Medicare Other

## 2016-06-08 LAB — GLUCOSE, CAPILLARY
GLUCOSE-CAPILLARY: 122 mg/dL — AB (ref 65–99)
Glucose-Capillary: 103 mg/dL — ABNORMAL HIGH (ref 65–99)
Glucose-Capillary: 176 mg/dL — ABNORMAL HIGH (ref 65–99)
Glucose-Capillary: 187 mg/dL — ABNORMAL HIGH (ref 65–99)

## 2016-06-08 NOTE — Progress Notes (Signed)
RN informed that patient is next to be seen by speech therapist.

## 2016-06-08 NOTE — Progress Notes (Signed)
Modified Barium Swallow Progress Note  Patient Details  Name: Robert [name] MRN: [id number] Date of Birth: May 16, 1935  Today's Date: 06/08/2016  Modified Barium Swallow completed.  Full report located under Chart Review in the Imaging Section.  Brief recommendations include the following:  Clinical Impression  Patient presents with moderate risk for aspiration and multifactorial pharyngeal dysphagia s/p carotid endarterectomy. Suspect pharyngeal deficits are primarily attributable to post-surgical edema impacting pharyngeal constriction and hyolaryngeal excursion, though pt's recent CVA may also be an underlying cause of weakness. Pharyngeal stage is remarkable for moderately decreased base of tongue retraction, severely reduced hyolaryngeal excursion and reduced pharyngeal constriction. Swallow initiation at the valleculae for teaspoons of thin liquids, pureed and soft solids, at the level of the pyriform sinuses with larger boluses of thin liquids via cup, straw. Pharyngeal deficits contribute to moderate-severe residue with solids in the valleculae and along the posterior pharyngeal wall, which are worse with mechanical soft vs. puree. Penetration to the level of the vocal cords x1 of mechanical soft solid residue after the swallow, which pt did not sense but was able to clear with a cued cough. Pt was able to clear pureed residue with cued dry swallows, thin liquid wash. Anticipate pt will be able to advance solids as edema decreases, strength improves. Recommend follow-up with ST in SNF post-discharge to address base of tongue weakness, laryngeal elevation and pharyngeal constriction. Recommend dys 1 with thin liquids, with multiple dry swallows after solid to clear residue, medications whole in puree. SLP will f/u for education, diet tolerance if pt remains in acute care.    Swallow Evaluation Recommendations   Recommended Consults: Consider ENT evaluation   SLP Diet Recommendations:  Dysphagia 1 (Puree) solids;Thin liquid   Liquid Administration via: Cup;Straw   Medication Administration: Whole meds with puree   Supervision: Patient able to self feed;Intermittent supervision to cue for compensatory strategies   Compensations: Slow rate;Small sips/bites;Multiple dry swallows after each bite/sip;Follow solids with liquid   Postural Changes: Seated upright at 90 degrees   Oral Care Recommendations: Oral care BID      Deneise Lever, [place] CF-SLP Speech-Language Pathologist [phone]  Aliene Altes 06/08/2016,2:20 PM

## 2016-06-08 NOTE — Progress Notes (Signed)
Speech therapy paged RN spoke with Olean Ree regarding patient need for swallow evaluation for discharge.  Olean Ree states "I am on my way."  RN will continue to follow up.

## 2016-06-08 NOTE — Progress Notes (Signed)
RN informed that speech therapist had to go ED for patient evaluation.

## 2016-06-08 NOTE — Progress Notes (Addendum)
  Progress Note    06/08/2016 7:29 AM 2 Days Post-Op  Subjective:  Says his swallowing was better for dinner as he ate jello without difficulty.  Did not have solid food.   afebrile HR 70's-80's NSR 616'W-737'T systolic 06% RA  Vitals:   06/07/16 2318 06/08/16 0325  BP: (!) 176/73 (!) 182/76  Pulse: 81 72  Resp: (!) 22 (!) 23  Temp: 98.3 F (36.8 C) 98 F (36.7 C)     Physical Exam: Neuro:  Mild dysarthria; hoarseness; continues to have residual left hand weakness, otherwise, all other extremities are equal.   Lungs:  Non labored Incision:  Clean and dry  CBC    Component Value Date/Time   WBC 9.9 06/07/2016 0348   RBC 3.16 (L) 06/07/2016 0348   HGB 8.9 (L) 06/07/2016 0348   HCT 27.6 (L) 06/07/2016 0348   PLT 134 (L) 06/07/2016 0348   MCV 87.3 06/07/2016 0348   MCH 28.2 06/07/2016 0348   MCHC 32.2 06/07/2016 0348   RDW 15.0 06/07/2016 0348   LYMPHSABS 1.0 05/20/2016 2043   MONOABS 0.8 05/20/2016 2043   EOSABS 0.2 05/20/2016 2043   BASOSABS 0.1 05/20/2016 2043    BMET    Component Value Date/Time   NA 139 06/07/2016 0348   NA 144 03/25/2016 1335   K 4.2 06/07/2016 0348   CL 110 06/07/2016 0348   CO2 21 (L) 06/07/2016 0348   GLUCOSE 164 (H) 06/07/2016 0348   BUN 77 (H) 06/07/2016 0348   BUN 60 (H) 03/25/2016 1335   CREATININE 4.16 (H) 06/07/2016 0348   CALCIUM 8.6 (L) 06/07/2016 0348   GFRNONAA 12 (L) 06/07/2016 0348   GFRAA 14 (L) 06/07/2016 0348     Intake/Output Summary (Last 24 hours) at 06/08/16 0729 Last data filed at 06/08/16 2694  Gross per 24 hour  Intake              220 ml  Output             1600 ml  Net            -1380 ml     Assessment/Plan:  This is a 81 y.o. male who is s/p right CEA 2 Days Post-Op  -pt is doing well this am. -pt with residual left hand weakness and mild dysarthria.  Ate jello for dinner without difficulty.  RN will order pt regular diet this morning and see if he has trouble with it.  Swallow eval not  completed yet.  If pt eats breakfast without difficulty, will dc back to [facility] today, otherwise, he will need swallow eval as ordered. -creatinine continues to improve with good UOP -pt has ambulated in the halls -pt has voided    Leontine Locket, PA-C [facility] [phone]   Appreciate SLP assistance.  MBS showed moderate aspiration risk and recommend dysphagia 1 diet with f/u SLP at [facility].  [name]

## 2016-06-08 NOTE — Progress Notes (Signed)
Assessment/Plan: 1. Acute kidney injury (on chronic kidney disease stage IV), improved P Labs in AM 2. Status post right carotid endarterectomy 3. Hypertension  5.Chronic diastolic heart failure: Currently off diuretics  Subjective: Interval History: Swallowing eval today  Objective: Vital signs in last 24 hours: Temp:  [97.9 F (36.6 C)-98.8 F (37.1 C)] 98.7 F (37.1 C) (04/14 1516) Pulse Rate:  [72-81] 79 (04/14 1516) Resp:  [14-23] 14 (04/14 1516) BP: (147-182)/(59-76) 147/69 (04/14 1516) SpO2:  [95 %-97 %] 95 % (04/14 1516) Weight:  [86 kg (189 lb 9.5 oz)] 86 kg (189 lb 9.5 oz) (04/14 0400) Weight change: -0.7 kg (-1 lb 8.7 oz)  Intake/Output from previous day: 04/13 0701 - 04/14 0700 In: 220 [P.O.:120; IV Piggyback:100] Out: 1725 [Urine:1725] Intake/Output this shift: Total I/O In: 0  Out: 300 [Urine:300]  General appearance: alert and cooperative Resp: clear to auscultation bilaterally Chest wall: no tenderness Cardio: regular rate and rhythm, S1, S2 normal, no murmur, click, rub or gallop Extremities: extremities normal, atraumatic, no cyanosis or edema  Lab Results:  Recent Labs  06/06/16 1550 06/07/16 0348  WBC 8.8 9.9  HGB 9.2* 8.9*  HCT 27.9* 27.6*  PLT 148* 134*   BMET:  Recent Labs  06/06/16 1550 06/07/16 0348  NA  --  139  K  --  4.2  CL  --  110  CO2  --  21*  GLUCOSE  --  164*  BUN  --  77*  CREATININE 4.38* 4.16*  CALCIUM  --  8.6*   No results for input(s): PTH in the last 72 hours. Iron Studies: No results for input(s): IRON, TIBC, TRANSFERRIN, FERRITIN in the last 72 hours. Studies/Results: Dg Chest Port 1 View  Result Date: 06/07/2016 CLINICAL DATA:  Wheezing.  History of cancer. EXAM: PORTABLE CHEST 1 VIEW COMPARISON:  05/21/2016. FINDINGS: The heart size and mediastinal contours are within normal limits. Both lungs are clear. The visualized skeletal structures are unremarkable. Loop recorder. IMPRESSION: No active disease.   Stable exam. Electronically Signed   By: Staci Righter M.D.   On: 06/07/2016 14:30   Dg Swallowing Func-speech Pathology  Result Date: 06/08/2016 Objective Swallowing Evaluation: Type of Study: MBS-Modified Barium Swallow Study Patient Details Name: Robert [name] MRN: [id number] Date of Birth: [birth year] Today's Date: 06/08/2016 Time: SLP Start Time (ACUTE ONLY): 1245-SLP Stop Time (ACUTE ONLY): 1315 SLP Time Calculation (min) (ACUTE ONLY): 30 min Past Medical History: Past Medical History: Diagnosis Date . Allergy   year round . Cancer (HCC)   PSA ELEVATED . Cataract  . Chronic kidney disease   DR MATTINGLY  . Diabetes mellitus  . Dyspnea  . GERD (gastroesophageal reflux disease)  . Hearing deficit  . History of kidney stones  . Hyperlipidemia  . Hypertension  . Myocardial infarction ([place]) 2011 . Stroke (cerebrum) ([place])  . Thyroid disease   hypothyroid Past Surgical History: Past Surgical History: Procedure Laterality Date . CORONARY ANGIOPLASTY WITH STENT PLACEMENT  07/2009  3 stents . ELBOW SURGERY    LEFT . ENDARTERECTOMY Right 06/06/2016  Procedure: RIGHT CAROTID ENDARTERECTOMY WITH PATCH ANGIOPLASTY;  Surgeon: Serafina Mitchell, MD;  Location: [facility];  Service: Vascular;  Laterality: Right; . EP IMPLANTABLE DEVICE N/A 05/16/2015  Procedure: Loop Recorder Insertion;  Surgeon: Thompson Grayer, MD;  Location: [place] [facility];  Service: Cardiovascular;  Laterality: N/A; . ESOPHAGOSCOPY WITH DILITATION   . insulin pump needs to be removed for mri   . NOSE SURGERY   .  stents   . TEE WITHOUT CARDIOVERSION N/A 05/16/2015  Procedure: TRANSESOPHAGEAL ECHOCARDIOGRAM (TEE);  Surgeon: Thayer Headings, MD;  Location: [facility] ENDOSCOPY;  Service: Cardiovascular;  Laterality: N/A; HPI: Pt is a 81 y/o male s/p R carotid endarterectomy. Of note, pt recently admitted on 3/26 secondary to R brain CVA. He was d/c'd on 3/30 to [facility]. PMH including but not limited to HTN, DM, CHF, CKD stage IV and hx of cryptogenic CVA. Pt seen by ST  during prior admissions (05/14/2015) for CVA, recommendations for regular diet, thin liquids, noted with impulsivity. Was not seen for swallowing during admission for CVA 2 weeks ago, though his son denies swallowing problems after CVA. Subjective: Alert, cooperative Assessment / Plan / Recommendation CHL IP CLINICAL IMPRESSIONS 06/08/2016 Clinical Impression Patient presents with moderate risk for aspiration and multifactorial pharyngeal dysphagia s/p carotid endarterectomy. Suspect pharyngeal deficits are primarily attributable to post-surgical edema impacting pharyngeal constriction and hyolaryngeal excursion, though pt's recent CVA may also be an underlying cause of weakness. Pharyngeal stage is remarkable for moderately decreased base of tongue retraction, severely reduced hyolaryngeal excursion and reduced pharyngeal constriction. Swallow initiation at the valleculae for teaspoons of thin liquids, pureed and soft solids, at the level of the pyriform sinuses with larger boluses of thin liquids via cup, straw. Pharyngeal deficits contribute to moderate-severe residue with solids in the valleculae and along the posterior pharyngeal wall, which are worse with mechanical soft vs. puree. Penetration to the level of the vocal cords x1 of mechanical soft solid residue after the swallow, which pt did not sense but was able to clear with a cued cough. Pt was able to clear pureed residue with cued dry swallows, thin liquid wash. Anticipate pt will be able to advance solids as edema decreases, strength improves. Recommend follow-up with ST in SNF post-discharge to address base of tongue weakness, laryngeal elevation and pharyngeal constriction. Recommend dys 1 with thin liquids, with multiple dry swallows after solid to clear residue, medications whole in puree. SLP will f/u for education, diet tolerance if pt remains in acute care.  SLP Visit Diagnosis Dysphagia, pharyngeal phase (R13.13) Attention and concentration  deficit following -- Frontal lobe and executive function deficit following -- Impact on safety and function Moderate aspiration risk   CHL IP TREATMENT RECOMMENDATION 06/08/2016 Treatment Recommendations Defer treatment plan to f/u with SLP   Prognosis 06/08/2016 Prognosis for Safe Diet Advancement Good Barriers to Reach Goals -- Barriers/Prognosis Comment -- CHL IP DIET RECOMMENDATION 06/08/2016 SLP Diet Recommendations Dysphagia 1 (Puree) solids;Thin liquid Liquid Administration via Cup;Straw Medication Administration Whole meds with puree Compensations Slow rate;Small sips/bites;Multiple dry swallows after each bite/sip;Follow solids with liquid Postural Changes Seated upright at 90 degrees   CHL IP OTHER RECOMMENDATIONS 06/08/2016 Recommended Consults Consider ENT evaluation Oral Care Recommendations Oral care BID Other Recommendations --   CHL IP FOLLOW UP RECOMMENDATIONS 06/08/2016 Follow up Recommendations Skilled Nursing facility   [facility] IP FREQUENCY AND DURATION 05/15/2015 Speech Therapy Frequency (ACUTE ONLY) min 2x/week Treatment Duration --      CHL IP ORAL PHASE 06/08/2016 Oral Phase WFL Oral - Pudding Teaspoon -- Oral - Pudding Cup -- Oral - Honey Teaspoon -- Oral - Honey Cup -- Oral - Nectar Teaspoon -- Oral - Nectar Cup -- Oral - Nectar Straw -- Oral - Thin Teaspoon -- Oral - Thin Cup -- Oral - Thin Straw -- Oral - Puree -- Oral - Mech Soft -- Oral - Regular -- Oral - Multi-Consistency -- Oral - Pill -- Oral  Phase - Comment --  CHL IP PHARYNGEAL PHASE 06/08/2016 Pharyngeal Phase Impaired Pharyngeal- Pudding Teaspoon -- Pharyngeal -- Pharyngeal- Pudding Cup -- Pharyngeal -- Pharyngeal- Honey Teaspoon -- Pharyngeal -- Pharyngeal- Honey Cup -- Pharyngeal -- Pharyngeal- Nectar Teaspoon -- Pharyngeal -- Pharyngeal- Nectar Cup -- Pharyngeal -- Pharyngeal- Nectar Straw -- Pharyngeal -- Pharyngeal- Thin Teaspoon Delayed swallow initiation-vallecula;Reduced anterior laryngeal mobility;Reduced laryngeal  elevation;Reduced tongue base retraction;Reduced pharyngeal peristalsis Pharyngeal -- Pharyngeal- Thin Cup Delayed swallow initiation-pyriform sinuses;Reduced pharyngeal peristalsis;Reduced laryngeal elevation;Reduced anterior laryngeal mobility;Reduced tongue base retraction Pharyngeal -- Pharyngeal- Thin Straw Delayed swallow initiation-pyriform sinuses;Reduced laryngeal elevation;Reduced anterior laryngeal mobility;Reduced pharyngeal peristalsis;Reduced tongue base retraction Pharyngeal -- Pharyngeal- Puree Delayed swallow initiation-vallecula;Reduced pharyngeal peristalsis;Reduced anterior laryngeal mobility;Reduced laryngeal elevation;Reduced tongue base retraction;Pharyngeal residue - valleculae;Pharyngeal residue - posterior pharnyx Pharyngeal -- Pharyngeal- Mechanical Soft Delayed swallow initiation-vallecula;Reduced pharyngeal peristalsis;Reduced anterior laryngeal mobility;Reduced laryngeal elevation;Reduced airway/laryngeal closure;Reduced tongue base retraction;Penetration/Apiration after swallow;Pharyngeal residue - valleculae;Pharyngeal residue - posterior pharnyx Pharyngeal Material enters airway, CONTACTS cords and not ejected out;Other (Comment) Pharyngeal- Regular -- Pharyngeal -- Pharyngeal- Multi-consistency -- Pharyngeal -- Pharyngeal- Pill Delayed swallow initiation-vallecula;Reduced epiglottic inversion;Reduced pharyngeal peristalsis;Reduced laryngeal elevation;Reduced tongue base retraction;Pharyngeal residue - valleculae Pharyngeal -- Pharyngeal Comment --  No flowsheet data found. CHL IP GO 05/21/2016 Functional Assessment Tool Used clinical judgement Functional Limitations Motor speech Swallow Current Status [phone]) (None) Swallow Goal Status (X1062) (None) Swallow Discharge Status (I9485) (None) Motor Speech Current Status (I6270) CJ Motor Speech Goal Status (J5009) CJ Motor Speech Goal Status (F8182) CJ Spoken Language Comprehension Current Status (X9371) (None) Spoken Language  Comprehension Goal Status (I9678) (None) Spoken Language Comprehension Discharge Status [phone]) (None) Spoken Language Expression Current Status [phone]) (None) Spoken Language Expression Goal Status (C5852) (None) Spoken Language Expression Discharge Status [phone]) (None) Attention Current Status (M3536) (None) Attention Goal Status (R4431) (None) Attention Discharge Status [phone]) (None) Memory Current Status (Q7619) (None) Memory Goal Status (J0932) (None) Memory Discharge Status (I7124) (None) Voice Current Status (P8099) (None) Voice Goal Status (I3382) (None) Voice Discharge Status (N0539) (None) Other Speech-Language Pathology Functional Limitation Current Status (J6734) (None) Other Speech-Language Pathology Functional Limitation Goal Status (L9379) (None) Other Speech-Language Pathology Functional Limitation Discharge Status [phone]) (None) Deneise Lever, MS CF-SLP Speech-Language Pathologist [phone] Aliene Altes 06/08/2016, 2:20 PM               Scheduled: . aspirin  325 mg Oral Daily  . calcitRIOL  0.25 mcg Oral Q M,W,F  . clopidogrel  75 mg Oral Daily  . docusate sodium  100 mg Oral Daily  . ezetimibe  10 mg Oral Daily  . heparin  5,000 Units Subcutaneous Q8H  . hydrALAZINE  100 mg Oral TID  . insulin aspart  0-15 Units Subcutaneous TID WC  . insulin glargine  35 Units Subcutaneous QHS  . levothyroxine  50 mcg Oral QAC breakfast  . metoprolol succinate  50 mg Oral BID  . multivitamin with minerals  1 tablet Oral Daily  . pantoprazole  40 mg Oral Daily  . simvastatin  40 mg Oral q1800    LOS: 2 days   Anakin Varkey C 06/08/2016,3:59 PM

## 2016-06-08 NOTE — Progress Notes (Signed)
Speech therapy paged awaiting response.

## 2016-06-08 NOTE — Progress Notes (Signed)
Speech therapy paged to request patient receive swallow evaluation.

## 2016-06-08 NOTE — Evaluation (Signed)
Clinical/Bedside Swallow Evaluation Patient Details  Name: Robert [name] MRN: [id number] Date of Birth: 01/06/1936  Today's Date: 06/08/2016 Time: SLP Start Time (ACUTE ONLY): 1115 SLP Stop Time (ACUTE ONLY): 1130 SLP Time Calculation (min) (ACUTE ONLY): 15 min  Past Medical History:  Past Medical History:  Diagnosis Date  . Allergy    year round  . Cancer (HCC)    PSA ELEVATED  . Cataract   . Chronic kidney disease    DR MATTINGLY   . Diabetes mellitus   . Dyspnea   . GERD (gastroesophageal reflux disease)   . Hearing deficit   . History of kidney stones   . Hyperlipidemia   . Hypertension   . Myocardial infarction ([place]) 2011  . Stroke (cerebrum) ([place])   . Thyroid disease    hypothyroid   Past Surgical History:  Past Surgical History:  Procedure Laterality Date  . CORONARY ANGIOPLASTY WITH STENT PLACEMENT  07/2009   3 stents  . ELBOW SURGERY     LEFT  . ENDARTERECTOMY Right 06/06/2016   Procedure: RIGHT CAROTID ENDARTERECTOMY WITH PATCH ANGIOPLASTY;  Surgeon: Serafina Mitchell, MD;  Location: [place];  Service: Vascular;  Laterality: Right;  . EP IMPLANTABLE DEVICE N/A 05/16/2015   Procedure: Loop Recorder Insertion;  Surgeon: Thompson Grayer, MD;  Location: [place] [facility];  Service: Cardiovascular;  Laterality: N/A;  . ESOPHAGOSCOPY WITH DILITATION    . insulin pump needs to be removed for mri    . NOSE SURGERY    . stents    . TEE WITHOUT CARDIOVERSION N/A 05/16/2015   Procedure: TRANSESOPHAGEAL ECHOCARDIOGRAM (TEE);  Surgeon: Thayer Headings, MD;  Location: [facility] ENDOSCOPY;  Service: Cardiovascular;  Laterality: N/A;   HPI:  Pt is a 81 y/o male s/p R carotid endarterectomy. Of note, pt recently admitted on 3/26 secondary to R brain CVA. He was d/c'd on 3/30 to [facility]. PMH including but not limited to HTN, DM, CHF, CKD stage IV and hx of cryptogenic CVA. Pt seen by ST during prior admissions (05/14/2015) for CVA, recommendations for regular diet, thin liquids, noted with  impulsivity. Was not seen for swallowing during admission for CVA 2 weeks ago, though his son denies swallowing problems after CVA.   Assessment / Plan / Recommendation Clinical Impression  Patient presents with severe risk for aspiration in the setting of recent carotid endarterectomy given multiple risk factors including intubation for procedure, history of GERD and multiple CVA (most recent 04/2016), and reduced management of secretions. Upon SLP entry, patient noted with gurgly, wet and hoarse vocal quality, suspect poor management/aspiration of secretions. Presents with immediate cough and increasingly wet vocal quality with ice, water and nectar-thick liquid trials, suggestive of reduced airway protection. Pt with multiple swallows. Provided education to patient and son regarding multiple risk factors for aspiration and recommendation for instrumental assessment to aid in determining safest, least restrictive diet as well as to identify physiologic impairments, compensatory manuevers that may improve swallowing function. They wish to proceed with MBS, which has been ordered to be performed STAT. Recommend NPO pending MBS. Additional recommendations to follow.  SLP Visit Diagnosis: Dysphagia, oropharyngeal phase (R13.12)    Aspiration Risk  Severe aspiration risk    Diet Recommendation NPO        Other  Recommendations Oral Care Recommendations: Oral care QID   Follow up Recommendations        Frequency and Duration            Prognosis Prognosis  for Safe Diet Advancement: Good      Swallow Study   General Date of Onset: 06/06/16 HPI: Pt is a 81 y/o male s/p R carotid endarterectomy. Of note, pt recently admitted on 3/26 secondary to R brain CVA. He was d/c'd on 3/30 to [facility]. PMH including but not limited to HTN, DM, CHF, CKD stage IV and hx of cryptogenic CVA. Pt seen by ST during prior admissions (05/14/2015) for CVA, recommendations for regular diet, thin liquids, noted with  impulsivity. Was not seen for swallowing during admission for CVA 2 weeks ago, though his son denies swallowing problems after CVA. Type of Study: Bedside Swallow Evaluation Previous Swallow Assessment: see HPI Diet Prior to this Study: Regular;Thin liquids Temperature Spikes Noted: No Respiratory Status: Room air History of Recent Intubation: Yes Length of Intubations (days): 1 days Date extubated: 06/06/16 Behavior/Cognition: Alert;Cooperative Oral Cavity Assessment: Dried secretions;Dry Oral Care Completed by SLP: Recent completion by staff Vision: Functional for self-feeding Self-Feeding Abilities: Able to feed self Patient Positioning: Upright in bed Baseline Vocal Quality: Wet;Hoarse Volitional Cough: Congested;Wet;Weak Volitional Swallow: Able to elicit    Oral/Motor/Sensory Function Overall Oral Motor/Sensory Function: Mild impairment Facial ROM: Reduced left Facial Symmetry: Abnormal symmetry left Facial Strength: Reduced left Facial Sensation: Within Functional Limits Lingual ROM: Within Functional Limits Lingual Symmetry: Within Functional Limits Lingual Strength: Within Functional Limits Lingual Sensation: Within Functional Limits Velum: Within Functional Limits Mandible: Within Functional Limits   Ice Chips Ice chips: Impaired Presentation: Spoon Pharyngeal Phase Impairments: Suspected delayed Swallow;Multiple swallows;Cough - Immediate;Change in Vital Signs;Wet Vocal Quality   Thin Liquid Thin Liquid: Impaired Presentation: Spoon Pharyngeal  Phase Impairments: Suspected delayed Swallow;Multiple swallows;Change in Vital Signs;Wet Vocal Quality;Cough - Immediate    Nectar Thick Nectar Thick Liquid: Impaired Presentation: Spoon Pharyngeal Phase Impairments: Suspected delayed Swallow;Multiple swallows;Wet Vocal Quality;Cough - Delayed;Change in Vital Signs   Honey Thick Honey Thick Liquid: Not tested   Puree Puree: Impaired Pharyngeal Phase Impairments: Throat  Clearing - Delayed;Multiple swallows;Wet Vocal Quality;Change in Vital Signs   Solid   GO   Solid: Not tested       Deneise Lever, MS CF-SLP Speech-Language Pathologist [phone]  Aliene Altes 06/08/2016,12:17 PM

## 2016-06-08 NOTE — Progress Notes (Signed)
Speech therapy paged to request swallow evaluation to be performed for patient.

## 2016-06-09 LAB — RENAL FUNCTION PANEL
ANION GAP: 6 (ref 5–15)
Albumin: 2.5 g/dL — ABNORMAL LOW (ref 3.5–5.0)
BUN: 67 mg/dL — ABNORMAL HIGH (ref 6–20)
CALCIUM: 8.9 mg/dL (ref 8.9–10.3)
CO2: 22 mmol/L (ref 22–32)
Chloride: 113 mmol/L — ABNORMAL HIGH (ref 101–111)
Creatinine, Ser: 3.8 mg/dL — ABNORMAL HIGH (ref 0.61–1.24)
GFR calc non Af Amer: 14 mL/min — ABNORMAL LOW (ref >60)
GFR, EST AFRICAN AMERICAN: 16 mL/min — AB (ref >60)
Glucose, Bld: 168 mg/dL — ABNORMAL HIGH (ref 65–99)
Phosphorus: 5.1 mg/dL — ABNORMAL HIGH (ref 2.5–4.6)
Potassium: 4.2 mmol/L (ref 3.5–5.1)
SODIUM: 141 mmol/L (ref 135–145)

## 2016-06-09 LAB — GLUCOSE, CAPILLARY
Glucose-Capillary: 164 mg/dL — ABNORMAL HIGH (ref 65–99)
Glucose-Capillary: 177 mg/dL — ABNORMAL HIGH (ref 65–99)
Glucose-Capillary: 100 mg/dL — ABNORMAL HIGH (ref 65–99)
Glucose-Capillary: 225 mg/dL — ABNORMAL HIGH (ref 65–99)

## 2016-06-09 NOTE — Progress Notes (Signed)
    Subjective  -   Resting comfortably   Physical Exam:  No acute changes       Assessment/Plan:    Speech eval yesterday.  Will continue to monitor swallowing.  Dysphagia 1 diet recommended.  May be able to return to [facility] tomorrow with SLP f/u and PT at [facility] [place] 06/09/2016 10:19 AM --  Vitals:   06/09/16 0641 06/09/16 0824  BP: (!) 141/50 (!) 166/54  Pulse: (!) 55 63  Resp: (!) 26 (!) 22  Temp:  98.6 F (37 C)    Intake/Output Summary (Last 24 hours) at 06/09/16 1019 Last data filed at 06/09/16 0730  Gross per 24 hour  Intake              120 ml  Output             1300 ml  Net            -1180 ml     Laboratory CBC    Component Value Date/Time   WBC 9.9 06/07/2016 0348   HGB 8.9 (L) 06/07/2016 0348   HCT 27.6 (L) 06/07/2016 0348   PLT 134 (L) 06/07/2016 0348    BMET    Component Value Date/Time   NA 141 06/09/2016 0337   NA 144 03/25/2016 1335   K 4.2 06/09/2016 0337   CL 113 (H) 06/09/2016 0337   CO2 22 06/09/2016 0337   GLUCOSE 168 (H) 06/09/2016 0337   BUN 67 (H) 06/09/2016 0337   BUN 60 (H) 03/25/2016 1335   CREATININE 3.80 (H) 06/09/2016 0337   CALCIUM 8.9 06/09/2016 0337   GFRNONAA 14 (L) 06/09/2016 0337   GFRAA 16 (L) 06/09/2016 0337    COAG Lab Results  Component Value Date   INR 0.95 06/05/2016   INR 0.99 05/20/2016   INR 0.98 05/13/2015   No results found for: PTT  Antibiotics Anti-infectives    Start     Dose/Rate Route Frequency Ordered Stop   06/06/16 2000  cefUROXime (ZINACEF) 1.5 g in dextrose 5 % 50 mL IVPB     1.5 g 100 mL/hr over 30 Minutes Intravenous Every 12 hours 06/06/16 1536 06/07/16 0913   06/06/16 0545  cefUROXime (ZINACEF) 1.5 g in dextrose 5 % 50 mL IVPB     1.5 g 100 mL/hr over 30 Minutes Intravenous 30 min pre-op 06/06/16 0545 06/06/16 0810       V. Leia Alf, M.D. [facility] Office: [phone] Pager:  [phone]

## 2016-06-09 NOTE — Progress Notes (Signed)
Assessment/Plan: 1. Acute kidney injury (on chronic kidney disease stage IV), improved 2. Status post right carotid endarterectomy 3. Hypertension  5.Chronic diastolic heart FYBOFBP(ZW25-85%): Currently off diuretics and will keep off until renal f/u in acouple of weeks  Subjective: Interval History: Better  Objective: Vital signs in last 24 hours: Temp:  [97.7 F (36.5 C)-98.6 F (37 C)] 97.7 F (36.5 C) (04/15 1512) Pulse Rate:  [55-82] 64 (04/15 1512) Resp:  [15-26] 18 (04/15 1512) BP: (140-180)/(50-65) 155/62 (04/15 1512) SpO2:  [89 %-98 %] 98 % (04/15 1512) Weight:  [86.2 kg (190 lb 0.6 oz)] 86.2 kg (190 lb 0.6 oz) (04/15 1224) Weight change:   Intake/Output from previous day: 04/14 0701 - 04/15 0700 In: 120 [P.O.:120] Out: 1000 [Urine:1000] Intake/Output this shift: Total I/O In: 280 [P.O.:280] Out: 500 [Urine:500]  General appearance: alert and cooperative Resp: clear to auscultation bilaterally Chest wall: no tenderness Cardio: regular rate and rhythm, S1, S2 normal, no murmur, click, rub or gallop Extremities: extremities normal, atraumatic, no cyanosis or edema  Lab Results:  Recent Labs  06/06/16 1550 06/07/16 0348  WBC 8.8 9.9  HGB 9.2* 8.9*  HCT 27.9* 27.6*  PLT 148* 134*   BMET:  Recent Labs  06/07/16 0348 06/09/16 0337  NA 139 141  K 4.2 4.2  CL 110 113*  CO2 21* 22  GLUCOSE 164* 168*  BUN 77* 67*  CREATININE 4.16* 3.80*  CALCIUM 8.6* 8.9   No results for input(s): PTH in the last 72 hours. Iron Studies: No results for input(s): IRON, TIBC, TRANSFERRIN, FERRITIN in the last 72 hours. Studies/Results: Dg Swallowing Func-speech Pathology  Result Date: 06/08/2016 Objective Swallowing Evaluation: Type of Study: MBS-Modified Barium Swallow Study Patient Details Name: Robert [name] MRN: [id number] Date of Birth: May 04, 1935 Today's Date: 06/08/2016 Time: SLP Start Time (ACUTE ONLY): 1245-SLP Stop Time (ACUTE ONLY): 1315 SLP Time Calculation  (min) (ACUTE ONLY): 30 min Past Medical History: Past Medical History: Diagnosis Date . Allergy   year round . Cancer (HCC)   PSA ELEVATED . Cataract  . Chronic kidney disease   DR MATTINGLY  . Diabetes mellitus  . Dyspnea  . GERD (gastroesophageal reflux disease)  . Hearing deficit  . History of kidney stones  . Hyperlipidemia  . Hypertension  . Myocardial infarction ([place]) 2011 . Stroke (cerebrum) ([place])  . Thyroid disease   hypothyroid Past Surgical History: Past Surgical History: Procedure Laterality Date . CORONARY ANGIOPLASTY WITH STENT PLACEMENT  07/2009  3 stents . ELBOW SURGERY    LEFT . ENDARTERECTOMY Right 06/06/2016  Procedure: RIGHT CAROTID ENDARTERECTOMY WITH PATCH ANGIOPLASTY;  Surgeon: Serafina Mitchell, MD;  Location: [facility];  Service: Vascular;  Laterality: Right; . EP IMPLANTABLE DEVICE N/A 05/16/2015  Procedure: Loop Recorder Insertion;  Surgeon: Thompson Grayer, MD;  Location: [facility];  Service: Cardiovascular;  Laterality: N/A; . ESOPHAGOSCOPY WITH DILITATION   . insulin pump needs to be removed for mri   . NOSE SURGERY   . stents   . TEE WITHOUT CARDIOVERSION N/A 05/16/2015  Procedure: TRANSESOPHAGEAL ECHOCARDIOGRAM (TEE);  Surgeon: Thayer Headings, MD;  Location[facility];  Service: Cardiovascular;  Laterality: N/A; HPI: Pt is a 81 y/o male s/p R carotid endarterectomy. Of note, pt recently admitted on 3/26 secondary to R brain CVA. He was d/c'd on 3/30 to [facility]. PMH including but not limited to HTN, DM, CHF, CKD stage IV and hx of cryptogenic CVA. Pt seen by ST during prior admissions (05/14/2015) for CVA,  recommendations for regular diet, thin liquids, noted with impulsivity. Was not seen for swallowing during admission for CVA 2 weeks ago, though his son denies swallowing problems after CVA. Subjective: Alert, cooperative Assessment / Plan / Recommendation CHL IP CLINICAL IMPRESSIONS 06/08/2016 Clinical Impression Patient presents with moderate risk for aspiration and multifactorial  pharyngeal dysphagia s/p carotid endarterectomy. Suspect pharyngeal deficits are primarily attributable to post-surgical edema impacting pharyngeal constriction and hyolaryngeal excursion, though pt's recent CVA may also be an underlying cause of weakness. Pharyngeal stage is remarkable for moderately decreased base of tongue retraction, severely reduced hyolaryngeal excursion and reduced pharyngeal constriction. Swallow initiation at the valleculae for teaspoons of thin liquids, pureed and soft solids, at the level of the pyriform sinuses with larger boluses of thin liquids via cup, straw. Pharyngeal deficits contribute to moderate-severe residue with solids in the valleculae and along the posterior pharyngeal wall, which are worse with mechanical soft vs. puree. Penetration to the level of the vocal cords x1 of mechanical soft solid residue after the swallow, which pt did not sense but was able to clear with a cued cough. Pt was able to clear pureed residue with cued dry swallows, thin liquid wash. Anticipate pt will be able to advance solids as edema decreases, strength improves. Recommend follow-up with ST in SNF post-discharge to address base of tongue weakness, laryngeal elevation and pharyngeal constriction. Recommend dys 1 with thin liquids, with multiple dry swallows after solid to clear residue, medications whole in puree. SLP will f/u for education, diet tolerance if pt remains in acute care.  SLP Visit Diagnosis Dysphagia, pharyngeal phase (R13.13) Attention and concentration deficit following -- Frontal lobe and executive function deficit following -- Impact on safety and function Moderate aspiration risk   CHL IP TREATMENT RECOMMENDATION 06/08/2016 Treatment Recommendations Defer treatment plan to f/u with SLP   Prognosis 06/08/2016 Prognosis for Safe Diet Advancement Good Barriers to Reach Goals -- Barriers/Prognosis Comment -- CHL IP DIET RECOMMENDATION 06/08/2016 SLP Diet Recommendations Dysphagia 1  (Puree) solids;Thin liquid Liquid Administration via Cup;Straw Medication Administration Whole meds with puree Compensations Slow rate;Small sips/bites;Multiple dry swallows after each bite/sip;Follow solids with liquid Postural Changes Seated upright at 90 degrees   CHL IP OTHER RECOMMENDATIONS 06/08/2016 Recommended Consults Consider ENT evaluation Oral Care Recommendations Oral care BID Other Recommendations --   CHL IP FOLLOW UP RECOMMENDATIONS 06/08/2016 Follow up Recommendations Skilled Nursing facility   [facility] IP FREQUENCY AND DURATION 05/15/2015 Speech Therapy Frequency (ACUTE ONLY) min 2x/week Treatment Duration --      CHL IP ORAL PHASE 06/08/2016 Oral Phase WFL Oral - Pudding Teaspoon -- Oral - Pudding Cup -- Oral - Honey Teaspoon -- Oral - Honey Cup -- Oral - Nectar Teaspoon -- Oral - Nectar Cup -- Oral - Nectar Straw -- Oral - Thin Teaspoon -- Oral - Thin Cup -- Oral - Thin Straw -- Oral - Puree -- Oral - Mech Soft -- Oral - Regular -- Oral - Multi-Consistency -- Oral - Pill -- Oral Phase - Comment --  CHL IP PHARYNGEAL PHASE 06/08/2016 Pharyngeal Phase Impaired Pharyngeal- Pudding Teaspoon -- Pharyngeal -- Pharyngeal- Pudding Cup -- Pharyngeal -- Pharyngeal- Honey Teaspoon -- Pharyngeal -- Pharyngeal- Honey Cup -- Pharyngeal -- Pharyngeal- Nectar Teaspoon -- Pharyngeal -- Pharyngeal- Nectar Cup -- Pharyngeal -- Pharyngeal- Nectar Straw -- Pharyngeal -- Pharyngeal- Thin Teaspoon Delayed swallow initiation-vallecula;Reduced anterior laryngeal mobility;Reduced laryngeal elevation;Reduced tongue base retraction;Reduced pharyngeal peristalsis Pharyngeal -- Pharyngeal- Thin Cup Delayed swallow initiation-pyriform sinuses;Reduced pharyngeal peristalsis;Reduced laryngeal elevation;Reduced anterior laryngeal mobility;Reduced  tongue base retraction Pharyngeal -- Pharyngeal- Thin Straw Delayed swallow initiation-pyriform sinuses;Reduced laryngeal elevation;Reduced anterior laryngeal mobility;Reduced pharyngeal  peristalsis;Reduced tongue base retraction Pharyngeal -- Pharyngeal- Puree Delayed swallow initiation-vallecula;Reduced pharyngeal peristalsis;Reduced anterior laryngeal mobility;Reduced laryngeal elevation;Reduced tongue base retraction;Pharyngeal residue - valleculae;Pharyngeal residue - posterior pharnyx Pharyngeal -- Pharyngeal- Mechanical Soft Delayed swallow initiation-vallecula;Reduced pharyngeal peristalsis;Reduced anterior laryngeal mobility;Reduced laryngeal elevation;Reduced airway/laryngeal closure;Reduced tongue base retraction;Penetration/Apiration after swallow;Pharyngeal residue - valleculae;Pharyngeal residue - posterior pharnyx Pharyngeal Material enters airway, CONTACTS cords and not ejected out;Other (Comment) Pharyngeal- Regular -- Pharyngeal -- Pharyngeal- Multi-consistency -- Pharyngeal -- Pharyngeal- Pill Delayed swallow initiation-vallecula;Reduced epiglottic inversion;Reduced pharyngeal peristalsis;Reduced laryngeal elevation;Reduced tongue base retraction;Pharyngeal residue - valleculae Pharyngeal -- Pharyngeal Comment --  No flowsheet data found. CHL IP GO 05/21/2016 Functional Assessment Tool Used clinical judgement Functional Limitations Motor speech Swallow Current Status [phone]) (None) Swallow Goal Status (Z9935) (None) Swallow Discharge Status (T0177) (None) Motor Speech Current Status (L3903) CJ Motor Speech Goal Status (E0923) CJ Motor Speech Goal Status (R0076) CJ Spoken Language Comprehension Current Status (A2633) (None) Spoken Language Comprehension Goal Status (H5456) (None) Spoken Language Comprehension Discharge Status [phone]) (None) Spoken Language Expression Current Status [phone] (None) Spoken Language Expression Goal Status (K8768) (None) Spoken Language Expression Discharge Status [phone]) (None) Attention Current Status (I2035) (None) Attention Goal Status (D9741) (None) Attention Discharge Status [phone]) (None) Memory Current Status (X6468) (None) Memory Goal Status  (E3212) (None) Memory Discharge Status (Y4825) (None) Voice Current Status (O0370) (None) Voice Goal Status (W8889) (None) Voice Discharge Status (V6945) (None) Other Speech-Language Pathology Functional Limitation Current Status (W3888) (None) Other Speech-Language Pathology Functional Limitation Goal Status (K8003) (None) Other Speech-Language Pathology Functional Limitation Discharge Status [phone]) (None) Deneise Lever, MS CF-SLP Speech-Language Pathologist [phone] Aliene Altes 06/08/2016, 2:20 PM               Scheduled: . aspirin  325 mg Oral Daily  . calcitRIOL  0.25 mcg Oral Q M,W,F  . clopidogrel  75 mg Oral Daily  . docusate sodium  100 mg Oral Daily  . ezetimibe  10 mg Oral Daily  . heparin  5,000 Units Subcutaneous Q8H  . hydrALAZINE  100 mg Oral TID  . insulin aspart  0-15 Units Subcutaneous TID WC  . insulin glargine  35 Units Subcutaneous QHS  . levothyroxine  50 mcg Oral QAC breakfast  . metoprolol succinate  50 mg Oral BID  . multivitamin with minerals  1 tablet Oral Daily  . pantoprazole  40 mg Oral Daily  . simvastatin  40 mg Oral q1800     LOS: 3 days   Odessie Polzin C 06/09/2016,3:35 PM

## 2016-06-10 ENCOUNTER — Ambulatory Visit (INDEPENDENT_AMBULATORY_CARE_PROVIDER_SITE_OTHER): Payer: Medicare Other | Admitting: *Deleted

## 2016-06-10 DIAGNOSIS — I639 Cerebral infarction, unspecified: Secondary | ICD-10-CM | POA: Diagnosis not present

## 2016-06-10 LAB — BASIC METABOLIC PANEL
Anion gap: 12 (ref 5–15)
BUN: 72 mg/dL — AB (ref 6–20)
CO2: 22 mmol/L (ref 22–32)
CREATININE: 4.13 mg/dL — AB (ref 0.61–1.24)
Calcium: 9.2 mg/dL (ref 8.9–10.3)
Chloride: 109 mmol/L (ref 101–111)
GFR calc Af Amer: 15 mL/min — ABNORMAL LOW (ref >60)
GFR calc non Af Amer: 13 mL/min — ABNORMAL LOW (ref >60)
GLUCOSE: 193 mg/dL — AB (ref 65–99)
POTASSIUM: 4.5 mmol/L (ref 3.5–5.1)
SODIUM: 143 mmol/L (ref 135–145)

## 2016-06-10 LAB — GLUCOSE, CAPILLARY
GLUCOSE-CAPILLARY: 163 mg/dL — AB (ref 65–99)
Glucose-Capillary: 151 mg/dL — ABNORMAL HIGH (ref 65–99)
Glucose-Capillary: 167 mg/dL — ABNORMAL HIGH (ref 65–99)

## 2016-06-10 NOTE — Progress Notes (Addendum)
Warm compress placed on right forearm per family/patient request. Patient denies tenderness or pain to site. Patient notified that compresses are written for as needed. Patient instructed that if compresses not offered and he feels like he needs one, he can request one.

## 2016-06-10 NOTE — NC FL2 (Signed)
[place] LEVEL OF CARE SCREENING TOOL     IDENTIFICATION  Patient Name: Robert [name] Birthdate: 27-Aug-1935 Sex: male Admission Date (Current Location): 06/06/2016  [facility] and [place] Number:  Herbalist and Address:  [facility] [place] [address], [place], [place] [address]      Provider Number: [id number]  Attending Physician Name and Address:  Serafina Mitchell, MD  Relative Name and Phone Number:       Current Level of Care: Hospital Recommended Level of Care: [place] Prior Approval Number:    Date Approved/Denied:   PASRR Number: [id number]  Discharge Plan: SNF    Current Diagnoses: Patient Active Problem List   Diagnosis Date Noted  . Symptomatic carotid artery stenosis, right 06/06/2016  . Carotid stenosis, right 05/21/2016  . AKI (acute kidney injury) ([place]) 05/20/2016  . Dysarthria 05/20/2016  . Thrombocytopenia ([place]) 05/20/2016  . Chronic diastolic CHF (congestive heart failure) ([place]) 05/20/2016  . Slurred speech   . Chronic kidney disease (CKD), stage IV (severe) ([place]) 03/20/2016  . Stroke (cerebrum) ([place])   . History of stroke 05/13/2015  . Acute encephalopathy 05/13/2015  . Hypertensive urgency 05/13/2015  . Hyperlipidemia 05/13/2015  . Diabetes mellitus, type II, insulin dependent ([place]) 08/18/2009  . Anemia 08/18/2009  . Essential hypertension 08/18/2009  . ALLERGIC RHINITIS, SEASONAL 08/18/2009  . PARAPHARYNGEAL ABSCESS 08/18/2009  . GERD 08/18/2009  . HIATAL HERNIA 08/18/2009  . DIVERTICULOSIS, COLON 08/18/2009  . 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 ARTERIOVENOUS MALFORMATION, COLON 08/18/2009  . DYSPHAGIA 08/18/2009  . COLONIC POLYPS, HX OF 08/18/2009  . RENAL CALCULUS, HX OF 08/18/2009  . ESOPHAGEAL STRICTURE 06/21/2009    Orientation RESPIRATION BLADDER Height & Weight     Self, Time, Situation, Place  Normal Continent Weight: 190 lb 0.6 oz (86.2  kg) Height:  5\' 8"  (172.7 cm)  BEHAVIORAL SYMPTOMS/MOOD NEUROLOGICAL BOWEL NUTRITION STATUS   (None)  (History of stroke) Continent Diet (DYS 1: Meds whole in puree. Multiple dry swallows to clear residue.)  AMBULATORY STATUS COMMUNICATION OF NEEDS Skin     Verbally Surgical wounds                       Personal Care Assistance Level of Assistance  Bathing, Feeding, Dressing Bathing Assistance: Limited assistance Feeding assistance: Limited assistance Dressing Assistance: Limited assistance     Functional Limitations Info  Sight, Hearing, Speech Sight Info: Adequate Hearing Info: Adequate Speech Info: Adequate    SPECIAL CARE FACTORS FREQUENCY  PT (By licensed PT), Speech therapy, Blood pressure     PT Frequency: 5 x week       Speech Therapy Frequency: 5 x week      Contractures Contractures Info: Not present    Additional Factors Info  Code Status, Allergies Code Status Info: Full Allergies Info: Lisinopril           Current Medications (06/10/2016):  This is the current hospital active medication list Current Facility-Administered Medications  Medication Dose Route Frequency Provider Last Rate Last Dose  . 0.9 %  sodium chloride infusion  500 mL Intravenous Once PRN Alvia Grove, PA-C      . albuterol (PROVENTIL) (2.5 MG/3ML) 0.083% nebulizer solution 3 mL  3 mL Inhalation Q6H PRN Alvia Grove, PA-C      . alum & mag hydroxide-simeth (MAALOX/MYLANTA) 200-200-20 MG/5ML suspension 15-30 mL  15-30 mL Oral Q2H PRN Alvia Grove, PA-C      .  aspirin EC tablet 325 mg  325 mg Oral Daily Alvia Grove, PA-C   325 mg at 06/09/16 1050  . calcitRIOL (ROCALTROL) capsule 0.25 mcg  0.25 mcg Oral Q M,W,F Janalyn Harder [place], PA-C   0.25 mcg at 06/07/16 0930  . clopidogrel (PLAVIX) tablet 75 mg  75 mg Oral Daily Alvia Grove, PA-C   75 mg at 06/09/16 1050  . docusate sodium (COLACE) capsule 100 mg  100 mg Oral Daily Alvia Grove, PA-C   100 mg at 06/09/16  1050  . ezetimibe (ZETIA) tablet 10 mg  10 mg Oral Daily Alvia Grove, PA-C   10 mg at 06/09/16 1050  . guaiFENesin-dextromethorphan (ROBITUSSIN DM) 100-10 MG/5ML syrup 15 mL  15 mL Oral Q4H PRN Alvia Grove, PA-C      . heparin injection 5,000 Units  5,000 Units Subcutaneous Q8H Alvia Grove, PA-C   5,000 Units at 06/10/16 [phone]  . hydrALAZINE (APRESOLINE) tablet 100 mg  100 mg Oral TID Alvia Grove, PA-C   100 mg at 06/09/16 2215  . insulin aspart (novoLOG) injection 0-15 Units  0-15 Units Subcutaneous TID WC Alvia Grove, PA-C   3 Units at 06/09/16 1223  . insulin glargine (LANTUS) injection 35 Units  35 Units Subcutaneous QHS Alvia Grove, PA-C   35 Units at 06/09/16 2216  . labetalol (NORMODYNE,TRANDATE) injection 10 mg  10 mg Intravenous Q10 min PRN Alvia Grove, PA-C   10 mg at 06/08/16 0417  . levothyroxine (SYNTHROID, LEVOTHROID) tablet 50 mcg  50 mcg Oral QAC breakfast Alvia Grove, PA-C   50 mcg at 06/09/16 1056  . magnesium sulfate IVPB 2 g 50 mL  2 g Intravenous Daily PRN Alvia Grove, PA-C      . metoprolol (LOPRESSOR) injection 2-5 mg  2-5 mg Intravenous Q2H PRN Alvia Grove, PA-C      . metoprolol succinate (TOPROL-XL) 24 hr tablet 50 mg  50 mg Oral BID Alvia Grove, PA-C   50 mg at 06/09/16 2215  . morphine 2 MG/ML injection 2-4 mg  2-4 mg Intravenous Q1H PRN Alvia Grove, PA-C      . multivitamin with minerals tablet 1 tablet  1 tablet Oral Daily Alvia Grove, PA-C   1 tablet at 06/09/16 1050  . ondansetron (ZOFRAN) injection 4 mg  4 mg Intravenous Q6H PRN Alvia Grove, PA-C   4 mg at 06/09/16 1935  . oxyCODONE-acetaminophen (PERCOCET/ROXICET) 5-325 MG per tablet 1-2 tablet  1-2 tablet Oral Q4H PRN Alvia Grove, PA-C      . pantoprazole (PROTONIX) EC tablet 40 mg  40 mg Oral Daily Alvia Grove, PA-C   40 mg at 06/09/16 1050  . phenol (CHLORASEPTIC) mouth spray 1 spray  1 spray Mouth/Throat PRN Alvia Grove, PA-C       . potassium chloride SA (K-DUR,KLOR-CON) CR tablet 20-40 mEq  20-40 mEq Oral Daily PRN Alvia Grove, PA-C      . simvastatin (ZOCOR) tablet 40 mg  40 mg Oral q1800 Alvia Grove, PA-C   40 mg at 06/09/16 1711     Discharge Medications: Please see discharge summary for a list of discharge medications.  Relevant Imaging Results:  Relevant Lab Results:   Additional Information SS#: [id number]  Candie Chroman, LCSW

## 2016-06-10 NOTE — Progress Notes (Signed)
  Speech Language Pathology Treatment: Dysphagia  Patient Details Name: Robert [name] MRN: [id number] DOB: [birth year] Today's Date: 06/10/2016 Time: 1125-1140 SLP Time Calculation (min) (ACUTE ONLY): 15 min  Assessment / Plan / Recommendation Clinical Impression  SLP provided verbal reinforcement of swallow strategies recommended after MBS. Pt aware of ongoing difficulty with solids and can verbalize need for two swallow, but for now, prefers to rely on liquid diet primarily. When observed the pt with liquids intermittent delayed coughing and wet vocal quality noted. Cues for preventative hard throat clear and second swallow reduced signs of aspiration. Pt able to carry over with min verbal cues. Provided demonstration for independent oral care, which pt would like to do more frequenlty than is being offered. Recommend pt continue current diet. Would like to start pharyngeal strengthening exercises, but would wait for surgical wound to heal.   HPI HPI: Pt is a 81 y/o male s/p R carotid endarterectomy. Of note, pt recently admitted on 3/26 secondary to R brain CVA. He was d/c'd on 3/30 to [facility]. PMH including but not limited to HTN, DM, CHF, CKD stage IV and hx of cryptogenic CVA. Pt seen by ST during prior admissions (05/14/2015) for CVA, recommendations for regular diet, thin liquids, noted with impulsivity. Was not seen for swallowing during admission for CVA 2 weeks ago, though his son denies swallowing problems after CVA.      SLP Plan  Continue with current plan of care       Recommendations  Diet recommendations: Dysphagia 1 (puree);Thin liquid Liquids provided via: Cup;Straw Medication Administration: Whole meds with puree Supervision: Patient able to self feed Compensations: Slow rate;Small sips/bites;Multiple dry swallows after each bite/sip;Follow solids with liquid;Clear throat intermittently                Oral Care Recommendations: Oral care QID Follow up  Recommendations: Skilled Nursing facility SLP Visit Diagnosis: Dysphagia, pharyngeal phase (R13.13) Plan: Continue with current plan of care       GO               [facility], MA CCC-SLP [phone]  Lynann Beaver 06/10/2016, 1:57 PM

## 2016-06-10 NOTE — Clinical Social Work Note (Signed)
CSW facilitated patient discharge including contacting patient family (daughter, Mickel Baas, and she will call her brother to have him meet the patient at the facility) and facility to confirm patient discharge plans. Clinical information faxed to facility and family agreeable with plan. CSW arranged ambulance transport via [facility] to [facility]. RN to call report prior to discharge [phone] Room 208).  CSW will sign off for now as social work intervention is no longer needed. Please consult [place] again if new needs arise.  Dayton Scrape, [place]

## 2016-06-10 NOTE — Progress Notes (Signed)
Patient packed and loaded on stretcher. En route to [facility].

## 2016-06-10 NOTE — Progress Notes (Signed)
Carelink Summary Report / Loop Recorder 

## 2016-06-10 NOTE — Clinical Social Work Note (Signed)
Clinical Social Work Assessment  Patient Details  Name: Robert [name] MRN: [id number] Date of Birth: 04/20/1935  Date of referral:  06/10/16               Reason for consult:  Discharge Planning                Permission sought to share information with:  Chartered certified accountant granted to share information::  Yes, Verbal Permission Granted  Name::        Agency::  [facility]  Relationship::     Contact Information:     Housing/Transportation Living arrangements for the past 2 months:  [place] of Information:  Patient, Scientist, water quality, Facility Patient Interpreter Needed:  None Criminal Activity/Legal Involvement Pertinent to Current Situation/Hospitalization:  No - Comment as needed Significant Relationships:  Adult Children, Spouse Lives with:  Facility Resident Do you feel safe going back to the place where you live?  Yes Need for family participation in patient care:  Yes (Comment)  Care giving concerns:  Patient was admitted from [facility]. PT recommending SNF once medically stable for discharge.   Social Worker assessment / plan:  CSW met with patient. No supports at bedside. CSW introduced role and explained that PT recommendations would be discussed. Patient confirmed that he was admitted from [facility] and plans to return there. Discharge orders and summary are in. Patient states he will need [facility]. CSW has confirmed plans with facility admissions coordinator. No further concerns. CSW encouraged patient to contact CSW as needed. CSW will continue to follow patient for support and facilitate discharge to SNF today.  Employment status:  Retired Nurse, adult PT Recommendations:  [place] / Referral to community resources:  [place]  Patient/Family's Response to care:  Patient agreeable to return to SNF. Patient's wife and  children supportive and involved in patient's care. Patient appreciated social work intervention.  Patient/Family's Understanding of and Emotional Response to Diagnosis, Current Treatment, and Prognosis:  Patient appears to have a good understanding of the reason for admission and PT recommendations for him to return to SNF. Patient appears pleased with hospital care.  Emotional Assessment Appearance:  Appears stated age Attitude/Demeanor/Rapport:  Lethargic Affect (typically observed):  Accepting, Pleasant Orientation:  Oriented to Self, Oriented to Place, Oriented to  Time, Oriented to Situation Alcohol / Substance use:  Never Used Psych involvement (Current and /or in the community):  No (Comment)  Discharge Needs  Concerns to be addressed:  Care Coordination Readmission within the last 30 days:  Yes Current discharge risk:  Dependent with Mobility Barriers to Discharge:  No Barriers Identified   Candie Chroman, LCSW 06/10/2016, 10:12 AM

## 2016-06-10 NOTE — Progress Notes (Signed)
[facility] at bedside. AVS summery and pertinent paper work received by [employer]. Patient stable. Patient's daughter called and updated.

## 2016-06-10 NOTE — Progress Notes (Signed)
Called [facility] per patient's daughter requested to check on delay of transport. Due to high call volume, [facility] delayed in transporting patient to [facility] and is unable to provide an ETA or range of time in which they will come.

## 2016-06-10 NOTE — Progress Notes (Signed)
Physical Therapy Treatment Patient Details Name: Robert [name] MRN: [id number] DOB: [birth year] Today's Date: 06/10/2016    History of Present Illness Pt is a 81 y/o male s/p R carotid endarterectomy. Of note, pt recently admitted on 3/26 secondary to R brain CVA. He was d/c'd on 3/30 to [facility]. PMH including but not limited to HTN, DM, CHF, CKD stage IV and hx of cryptogenic CVA.    PT Comments    Patient progressing with ambulation, though still c/o fatigue and seated rest needed as well as requested return to supine after return to room.  Feel continued skilled PT in the acute setting needed to progress balance/safety and L side awareness prior to return to SNF rehab at d/c.    Follow Up Recommendations  SNF;Other (comment) (return to [facility])     Equipment Recommendations  None recommended by PT;Other (comment) (defer to next level of care)    Recommendations for Other Services       Precautions / Restrictions Precautions Precautions: Fall Precaution Comments: decreased L side awareness ?inattention    Mobility  Bed Mobility Overal bed mobility: Needs Assistance Bed Mobility: Sit to Sidelying         Sit to sidelying: Supervision General bed mobility comments: for safety with lines  Transfers Overall transfer level: Needs assistance Equipment used: None Transfers: Sit to/from Stand Sit to Stand: Min guard         General transfer comment: for safety and stability  Ambulation/Gait Ambulation/Gait assistance: Min guard Ambulation Distance (Feet): 100 Feet (x 2) Assistive device: None Gait Pattern/deviations: Step-through pattern;Decreased stride length;Drifts right/left     General Gait Details: cues for noting obstacles on L and keeping safe distance, demonstrates veering to R and L at times, performed some head turns, but noted stiffiness in neck from CEA with mild soreness   Stairs            Wheelchair Mobility    Modified Rankin (Stroke  Patients Only)       Balance Overall balance assessment: Needs assistance   Sitting balance-Leahy Scale: Good     Standing balance support: During functional activity;No upper extremity supported Standing balance-Leahy Scale: Fair                 High Level Balance Comments: sit<>stand x 5 with min UE support, initially min A for anterior weith shift with stand to sit, then minguard            Cognition Arousal/Alertness: Awake/alert Behavior During Therapy: WFL for tasks assessed/performed Overall Cognitive Status: Impaired/Different from baseline Area of Impairment: Safety/judgement                         Safety/Judgement: Decreased awareness of safety;Decreased awareness of deficits     General Comments: cues for L side awareness      Exercises      General Comments        Pertinent Vitals/Pain Faces Pain Scale: No hurt    Home Living                      Prior Function            PT Goals (current goals can now be found in the care plan section) Progress towards PT goals: Progressing toward goals    Frequency    Min 3X/week      PT Plan Current plan remains appropriate    Co-evaluation  End of Session Equipment Utilized During Treatment: Gait belt Activity Tolerance: Patient limited by fatigue Patient left: in bed;with call bell/phone within reach;with bed alarm set   PT Visit Diagnosis: Other abnormalities of gait and mobility (R26.89)     Time: 7121-9758 PT Time Calculation (min) (ACUTE ONLY): 18 min  Charges:  $Gait Training: 8-22 mins                    G CodesMagda Kiel, [place] 06/10/2016    Reginia Naas 06/10/2016, 1:45 PM

## 2016-06-10 NOTE — Care Management Important Message (Signed)
Important Message  Patient Details  Name: Robert [name] MRN: [id number] Date of Birth: 02/12/1936   Medicare Important Message Given:  Yes    Nathen May 06/10/2016, 2:54 PM

## 2016-06-10 NOTE — Progress Notes (Signed)
  [facility] Progress Note    Assessment/ Plan:   1. Acute kidney injury (on chronic kidney disease stage IV), improved; baseline creatinine around 3.5.  Will make sure he has f/u with Dr. Mercy Moore. 2. Status post right carotid endarterectomy 3. Hypertension  5.Chronic diastolic heart NGEXBMW(UX32-44%): Currently off diuretics and will keep off until renal f/u in acouple of weeks  Subjective:    To be discharged to SNF today- transport in room   Objective:   BP 119/69 (BP Location: Right Arm)   Pulse 71   Temp 97.4 F (36.3 C) (Oral)   Resp (!) 23   Ht 5\' 8"  (1.727 m)   Wt 86.2 kg (190 lb 0.6 oz)   SpO2 91%   BMI 28.89 kg/m   Intake/Output Summary (Last 24 hours) at 06/10/16 1336 Last data filed at 06/10/16 0732  Gross per 24 hour  Intake              400 ml  Output              202 ml  Net              198 ml   Weight change:   Physical Exam: General appearance: alert and cooperative Resp: clear to auscultation bilaterally Chest wall: no tenderness Cardio: regular rate and rhythm, S1, S2 normal, no murmur, click, rub or gallop Extremities: extremities normal, atraumatic, no cyanosis or edema  Imaging: No results found.  Labs: BMET  Recent Labs Lab 06/05/16 1552 06/06/16 1550 06/07/16 0348 06/09/16 0337 06/10/16 0154  NA 137  --  139 141 143  K 4.7  --  4.2 4.2 4.5  CL 106  --  110 113* 109  CO2 20*  --  21* 22 22  GLUCOSE 227*  --  164* 168* 193*  BUN 85*  --  77* 67* 72*  CREATININE 4.78* 4.38* 4.16* 3.80* 4.13*  CALCIUM 9.3  --  8.6* 8.9 9.2  PHOS  --   --  4.5 5.1*  --    CBC  Recent Labs Lab 06/05/16 1552 06/06/16 1550 06/07/16 0348  WBC 7.7 8.8 9.9  HGB 12.7* 9.2* 8.9*  HCT 39.0 27.9* 27.6*  MCV 87.1 86.9 87.3  PLT 205 148* 134*    Medications:    . aspirin  325 mg Oral Daily  . calcitRIOL  0.25 mcg Oral Q M,W,F  . clopidogrel  75 mg Oral Daily  . docusate sodium  100 mg Oral Daily  . ezetimibe  10 mg Oral  Daily  . heparin  5,000 Units Subcutaneous Q8H  . hydrALAZINE  100 mg Oral TID  . insulin aspart  0-15 Units Subcutaneous TID WC  . insulin glargine  35 Units Subcutaneous QHS  . levothyroxine  50 mcg Oral QAC breakfast  . metoprolol succinate  50 mg Oral BID  . multivitamin with minerals  1 tablet Oral Daily  . pantoprazole  40 mg Oral Daily  . simvastatin  40 mg Oral q1800      Madelon Lips, MD 06/10/2016, 1:36 PM

## 2016-06-10 NOTE — Progress Notes (Addendum)
Vascular PA came and assessed patient's IV site. Order received to use warm compresses as needed. Will add to AVS summary. Called Education officer, museum, who will place patient back in PTAR to be picked up for transport. Will call and notify [facility] and update family.

## 2016-06-10 NOTE — Progress Notes (Signed)
Notified Maureen, [place], via OR nurse that patient has signs/symptom of phlebitis at right forearm. Found when taking out PIV to transfer patient via PTAR. Order received to hold transfer to [facility] until assessed by MD or physician extender.

## 2016-06-10 NOTE — Clinical Social Work Note (Signed)
Patient back on PTAR list. They should be here in about an hour and a half. CSW left message for RN with [employer].  CSW signing off.   Dayton Scrape, [place]

## 2016-06-10 NOTE — Progress Notes (Signed)
Report called to nurse Sharyn Lull at [facility]. Report received.

## 2016-06-10 NOTE — Progress Notes (Addendum)
  Progress Note    06/10/2016 7:24 AM 4 Days Post-Op  Subjective:  Sleeping-awakes easily to voice.  No complaints  afebrile HR 60's-70's NSR 631'S-970'Y systolic 63% RA (dropped to 82% briefly while sleeping and bounced right back)  Vitals:   06/09/16 2232 06/10/16 0303  BP: (!) 134/54 (!) 126/42  Pulse: 72 67  Resp: (!) 24 (!) 22  Temp: 97.6 F (36.4 C) 97.7 F (36.5 C)     Physical Exam: Neuro:  Unchanged from previous exam Lungs:  Non labored Incision:  Clean and dry without hematoma  CBC    Component Value Date/Time   WBC 9.9 06/07/2016 0348   RBC 3.16 (L) 06/07/2016 0348   HGB 8.9 (L) 06/07/2016 0348   HCT 27.6 (L) 06/07/2016 0348   PLT 134 (L) 06/07/2016 0348   MCV 87.3 06/07/2016 0348   MCH 28.2 06/07/2016 0348   MCHC 32.2 06/07/2016 0348   RDW 15.0 06/07/2016 0348   LYMPHSABS 1.0 05/20/2016 2043   MONOABS 0.8 05/20/2016 2043   EOSABS 0.2 05/20/2016 2043   BASOSABS 0.1 05/20/2016 2043    BMET    Component Value Date/Time   NA 143 06/10/2016 0154   NA 144 03/25/2016 1335   K 4.5 06/10/2016 0154   CL 109 06/10/2016 0154   CO2 22 06/10/2016 0154   GLUCOSE 193 (H) 06/10/2016 0154   BUN 72 (H) 06/10/2016 0154   BUN 60 (H) 03/25/2016 1335   CREATININE 4.13 (H) 06/10/2016 0154   CALCIUM 9.2 06/10/2016 0154   GFRNONAA 13 (L) 06/10/2016 0154   GFRAA 15 (L) 06/10/2016 0154     Intake/Output Summary (Last 24 hours) at 06/10/16 0724 Last data filed at 06/09/16 1846  Gross per 24 hour  Intake              520 ml  Output              501 ml  Net               19 ml     Assessment/Plan:  This is a 81 y.o. male who is s/p right CEA 4 Days Post-Op  -pt is doing well this am. -pt neuro exam is in tact; tongue is midline -pt has ambulated -f/u with Dr. Trula Slade in 2 weeks. -dc back to SNF today -ENT consult recommended by speech.  Dr. Trula Slade feels since he just had an acute stroke and surgery, will defer consult for a couple of weeks until he is  seen back in the office   Leontine Locket, PA-C [facility] [phone] I agree with the above.  Patient can return to SNF today.  Called to patient room right forearm with raised, painful vein s/p removal of IV.  Thrombophlebitis.  Recommended warm compresses PRN and pain medication.  [name]

## 2016-06-11 ENCOUNTER — Telehealth: Payer: Self-pay

## 2016-06-11 NOTE — Telephone Encounter (Signed)
[name] called and LVM to make apt for f/u. I returned the call and gave her apt. already scheduled.

## 2016-06-19 ENCOUNTER — Encounter: Payer: Self-pay | Admitting: Surgery

## 2016-06-23 LAB — CUP PACEART REMOTE DEVICE CHECK
Date Time Interrogation Session: 20180415214038
MDC IDC PG IMPLANT DT: 20170321

## 2016-06-23 NOTE — Progress Notes (Signed)
Carelink summary report received. Battery status OK. Normal device function. No new symptom episodes, tachy episodes, brady, or pause episodes. No new AF episodes. Monthly summary reports and ROV/PRN 

## 2016-06-25 ENCOUNTER — Other Ambulatory Visit (HOSPITAL_COMMUNITY): Payer: Self-pay | Admitting: *Deleted

## 2016-06-26 ENCOUNTER — Encounter: Payer: Self-pay | Admitting: Surgery

## 2016-06-26 ENCOUNTER — Encounter (HOSPITAL_COMMUNITY)
Admission: RE | Admit: 2016-06-26 | Discharge: 2016-06-26 | Disposition: A | Discharge status: Home / Self Care | Payer: Medicare Other | Source: Ambulatory Visit | Attending: Nephrology | Admitting: Nephrology

## 2016-06-26 ENCOUNTER — Ambulatory Visit (INDEPENDENT_AMBULATORY_CARE_PROVIDER_SITE_OTHER): Payer: Medicare Other | Admitting: Surgery

## 2016-06-26 VITALS — BP 121/74 | HR 67 | Temp 97.4°F | Resp 20 | Ht 68.0 in | Wt 180.2 lb

## 2016-06-26 DIAGNOSIS — N184 Chronic kidney disease, stage 4 (severe): Secondary | ICD-10-CM | POA: Insufficient documentation

## 2016-06-26 DIAGNOSIS — N185 Chronic kidney disease, stage 5: Secondary | ICD-10-CM | POA: Diagnosis not present

## 2016-06-26 DIAGNOSIS — D638 Anemia in other chronic diseases classified elsewhere: Secondary | ICD-10-CM | POA: Insufficient documentation

## 2016-06-26 DIAGNOSIS — D631 Anemia in chronic kidney disease: Secondary | ICD-10-CM

## 2016-06-26 LAB — FERRITIN: FERRITIN: 113 ng/mL (ref 24–336)

## 2016-06-26 LAB — IRON AND TIBC
IRON: 55 ug/dL (ref 45–182)
SATURATION RATIOS: 19 % (ref 17.9–39.5)
TIBC: 288 ug/dL (ref 250–450)
UIBC: 233 ug/dL

## 2016-06-26 LAB — POCT HEMOGLOBIN-HEMACUE: HEMOGLOBIN: 10.1 g/dL — AB (ref 13.0–17.0)

## 2016-06-26 MED ORDER — DARBEPOETIN ALFA 100 MCG/0.5ML IJ SOSY
100.0000 ug | PREFILLED_SYRINGE | INTRAMUSCULAR | Status: DC
  Administered 2016-06-26: 13:00:00 100 ug via SUBCUTANEOUS

## 2016-06-26 MED ORDER — DARBEPOETIN ALFA 100 MCG/0.5ML IJ SOSY
PREFILLED_SYRINGE | INTRAMUSCULAR | Status: AC
  Filled 2016-06-26: qty 0.5

## 2016-06-26 NOTE — Progress Notes (Signed)
Patient name: Robert [name] MRN: [id number] DOB: Jul 09, 1935 Sex: male  REASON FOR VISIT:    #1: post op carotid #2:  Chronic renal insufficiency, stage 5  HISTORY OF PRESENT ILLNESS:   Robert [name] is a 81 y.o. male, who Presented to the hospital on 05/20/2016 with slurred speech.  He was also felt to have a left facial droop.  The patient does have a history of a prior stroke with similar symptoms.  A noncontrast CT scan was negative for acute pathology.  He was not given TPA.  Carotid Doppler studies show 60-79% right carotid stenosis.  His MRI was positive for right-sided infarct.  He was hypertensive on arrival with blood pressure of 210/95.  On 06/14/2016, he underwent right carotid endarterectomy with patch angioplasty.  Intraoperative findings included a internal carotid 180 rotated.  The distal endpoint of the plaque was high up under the hypoglossal.  This was done without a shunt because of poor distal visualization.  He was discharged home on postoperative day 2.  He stated today because of nausea and the inability to void.  Today he is still having difficulty using his left arm.  He has the inability to pick up objects or to move his fingers.  His swallowing is back to baseline and he passed a swallowing evaluation.  The patient suffers from diabetes which has not been well controlled.  He takes a statin for hypercholesterolemia.  He is medically managed for hypertension.  He also suffers from stage IV renal insufficiency.  He has been evaluated for dialysis access in the past, but refused.  He is now closer to needing to start dialysis and is interested in proceeding with access placement.  CURRENT MEDICATIONS:    Current Outpatient Prescriptions  Medication Sig Dispense Refill  . albuterol (PROVENTIL HFA;VENTOLIN HFA) 108 (90 Base) MCG/ACT inhaler Inhale 2 puffs into the lungs every 6 (six) hours as needed for wheezing or shortness of  breath.    [name] aspirin 325 MG EC tablet Take 325 mg by mouth daily.    . calcitRIOL (ROCALTROL) 0.25 MCG capsule Take 0.25 mcg by mouth every Monday, Wednesday, and Friday.    . clopidogrel (PLAVIX) 75 MG tablet Take 1 tablet (75 mg total) by mouth daily. 30 tablet 1  . ezetimibe (ZETIA) 10 MG tablet Take 1 tablet (10 mg total) by mouth daily. 30 tablet 1  . furosemide (LASIX) 40 MG tablet Take 40 mg by mouth 2 (two) times daily.    . hydrALAZINE (APRESOLINE) 100 MG tablet Take 100 mg by mouth 3 (three) times daily.     . insulin aspart (NOVOLOG) 100 UNIT/ML injection CBG < 70: implement hypoglycemia protocol  CBG 70 - 120: 0 units  CBG 121 - 150: 2 units  CBG 151 - 200: 3 units  CBG 201 - 250: 5 units  CBG 251 - 300: 8 units  CBG 301 - 350: 11 units  CBG 351 - 400: 15 units  CBG > 400 call MD and obtain STAT lab verification 10 mL 11  . insulin glargine (LANTUS) 100 UNIT/ML injection Inject 0.35 mLs (35 Units total) into the skin at bedtime. 10 mL 11  . levothyroxine (SYNTHROID, LEVOTHROID) 50 MCG tablet Take 50 mcg by mouth daily before breakfast.     . metoprolol succinate (TOPROL-XL) 50 MG 24 hr tablet Take 50 mg by mouth 2 (two) times daily.     . Multiple Vitamins-Minerals (MULTIVITAMIN WITH MINERALS) tablet Take 1 tablet  by mouth daily.    [name] omeprazole (PRILOSEC) 20 MG capsule Take 1 capsule by mouth Daily.    [name] oxyCODONE-acetaminophen (PERCOCET/ROXICET) 5-325 MG tablet Take 1-2 tablets by mouth every 6 (six) hours as needed for moderate pain. 8 tablet 0  . simvastatin (ZOCOR) 40 MG tablet Take 1 tablet (40 mg total) by mouth daily at 6 PM. 30 tablet 0   No current facility-administered medications for this visit.     REVIEW OF SYSTEMS:   [X]  denotes positive finding, [ ]  denotes negative finding Cardiac  Comments:  Chest pain or chest pressure:    Shortness of breath upon exertion:    Short of breath when lying flat:    Irregular heart rhythm:    Constitutional    Fever or  chills:      PHYSICAL EXAM:   There were no vitals filed for this visit.  GENERAL: The patient is a well-nourished male, in no acute distress. The vital signs are documented above. CARDIOVASCULAR: There is a regular rate and rhythm. PULMONARY: Non-labored respirations Right carotid incision is healing nicely.  STUDIES:   I have reviewed his vein mapping which shows adequate sized right-sided cephalic vein.  The left side is too small   MEDICAL ISSUES:   Carotid stenosis: The patient is status post right carotid endarterectomy for stroke.  He appears to be recovering nicely.  He still has deficits in the left arm which have only made minimal improvement.  He is continuing to get outpatient therapy.  Renal disease: The patient's renal function has worsened.  He does not have access in place currently.  We discussed proceeding with a right-sided fistula.  I suspect I would proceed with a right radiocephalic fistula but may have to go more central with a brachiocephalic fistula or even a first stage basilic vein procedure.  I did discuss this with the patient he understands the risks and benefits of the procedure which include but are not limited to the risk of steal and non-maturity.  I would like for him to continue with therapy for his stroke for at least another subcutaneous weeks since his operation will need to be on his good hand.  Therefore I have scheduled his fistula placement for Thursday, May 31.  I will stop his Plavix 5 days prior.  [name], MD [facility] [phone] Pager [phone]

## 2016-07-09 ENCOUNTER — Ambulatory Visit (INDEPENDENT_AMBULATORY_CARE_PROVIDER_SITE_OTHER): Payer: Medicare Other | Admitting: *Deleted

## 2016-07-09 DIAGNOSIS — I639 Cerebral infarction, unspecified: Secondary | ICD-10-CM

## 2016-07-10 NOTE — Progress Notes (Signed)
Carelink Summary Report / Loop Recorder 

## 2016-07-11 ENCOUNTER — Other Ambulatory Visit: Payer: Self-pay

## 2016-07-11 ENCOUNTER — Telehealth: Payer: Self-pay

## 2016-07-11 NOTE — Telephone Encounter (Signed)
Phone call to Dr. Jacquiline Doe office to inform him of plan for holding Plavix x 5 days, prior to surgery, for Creation of right arm AVF.  Requested to have Dr. Jacquiline Doe approval.   Rec'd return call from nurse at Dr. Jacquiline Doe office.  Reported that Dr. Reynaldo Minium approved of holding Plavix x 5 days, pre-op, and to resume the evening following the procedure.    Pt's son, Mattis Featherly, III, was notified of having the pt. hold Plavix 5 days prior to procedure.

## 2016-07-12 ENCOUNTER — Encounter (HOSPITAL_COMMUNITY)
Admission: RE | Admit: 2016-07-12 | Discharge: 2016-07-12 | Disposition: A | Discharge status: Home / Self Care | Payer: Medicare Other | Source: Ambulatory Visit | Attending: Nephrology | Admitting: Nephrology

## 2016-07-12 DIAGNOSIS — N184 Chronic kidney disease, stage 4 (severe): Principal | ICD-10-CM

## 2016-07-12 DIAGNOSIS — D631 Anemia in chronic kidney disease: Secondary | ICD-10-CM

## 2016-07-12 LAB — POCT HEMOGLOBIN-HEMACUE: Hemoglobin: 9.6 g/dL — ABNORMAL LOW (ref 13.0–17.0)

## 2016-07-12 MED ORDER — DARBEPOETIN ALFA 100 MCG/0.5ML IJ SOSY
PREFILLED_SYRINGE | INTRAMUSCULAR | Status: AC
  Administered 2016-07-12: 100 ug via SUBCUTANEOUS
  Filled 2016-07-12: qty 0.5

## 2016-07-12 MED ORDER — DARBEPOETIN ALFA 100 MCG/0.5ML IJ SOSY
100.0000 ug | PREFILLED_SYRINGE | INTRAMUSCULAR | Status: DC
  Administered 2016-07-12: 100 ug via SUBCUTANEOUS

## 2016-07-20 LAB — CUP PACEART REMOTE DEVICE CHECK
Date Time Interrogation Session: 20180515213834
Implantable Pulse Generator Implant Date: 20170321

## 2016-07-20 NOTE — Progress Notes (Signed)
Carelink summary report received. Battery status OK. Normal device function. No new symptom episodes, tachy episodes, brady, or pause episodes. No new AF episodes. Monthly summary reports and ROV/PRN 

## 2016-07-24 ENCOUNTER — Encounter (HOSPITAL_COMMUNITY): Payer: Self-pay | Admitting: *Deleted

## 2016-07-24 NOTE — Progress Notes (Signed)
Pt denies SOB and chest pain but is under the care of Dr. Wynonia Lawman, Cardiology. Pt stated that a stress test and cardiac cath was performed; records requested from Dr. Wynonia Lawman. Pt made aware to stop taking vitamins, fish oil, and herbal medications. Do not take any NSAIDs ie: Ibuprofen, Advil, Naproxen, BC and Goody powder. Pt stated that his last dose of Plavix was Friday, 07/19/16 as instructed by MD. Damaris Schooner with Janine, Diabetic Coordinator, regarding pre-op instructions for pt VGO 30 insulin pump. Janine advised that it is okay for pt keep pump on but call prescribing MD for further recommendations; pt made aware. Pt also made aware to check BG every 2 hours prior to arrival to hospital tomorrow, treat a BG< 70 with 4 ounces of apple or cranberry juice, wait 15 minutes after drinking juice and recheck BG, if BG remains < 70, call SS unit and speak with a nurse. Pt verbalized understanding of all pre-op instructions.

## 2016-07-25 ENCOUNTER — Ambulatory Visit (HOSPITAL_COMMUNITY): Payer: Medicare Other | Admitting: Certified Registered Nurse Anesthetist

## 2016-07-25 ENCOUNTER — Encounter (HOSPITAL_COMMUNITY)
Admission: RE | Disposition: A | Discharge status: Home / Self Care | Payer: Self-pay | Source: Ambulatory Visit | Attending: Surgery

## 2016-07-25 ENCOUNTER — Encounter (HOSPITAL_COMMUNITY): Payer: Self-pay | Admitting: *Deleted

## 2016-07-25 ENCOUNTER — Ambulatory Visit (HOSPITAL_COMMUNITY)
Admission: RE | Admit: 2016-07-25 | Discharge: 2016-07-25 | Disposition: A | Discharge status: Home / Self Care | Payer: Medicare Other | Source: Ambulatory Visit | Attending: Surgery | Admitting: Surgery

## 2016-07-25 DIAGNOSIS — I6521 Occlusion and stenosis of right carotid artery: Secondary | ICD-10-CM | POA: Insufficient documentation

## 2016-07-25 DIAGNOSIS — I252 Old myocardial infarction: Secondary | ICD-10-CM | POA: Diagnosis not present

## 2016-07-25 DIAGNOSIS — E7800 Pure hypercholesterolemia, unspecified: Secondary | ICD-10-CM | POA: Diagnosis not present

## 2016-07-25 DIAGNOSIS — Z79899 Other long term (current) drug therapy: Secondary | ICD-10-CM | POA: Insufficient documentation

## 2016-07-25 DIAGNOSIS — J449 Chronic obstructive pulmonary disease, unspecified: Secondary | ICD-10-CM | POA: Insufficient documentation

## 2016-07-25 DIAGNOSIS — Z794 Long term (current) use of insulin: Secondary | ICD-10-CM | POA: Insufficient documentation

## 2016-07-25 DIAGNOSIS — N185 Chronic kidney disease, stage 5: Secondary | ICD-10-CM | POA: Insufficient documentation

## 2016-07-25 DIAGNOSIS — Z7982 Long term (current) use of aspirin: Secondary | ICD-10-CM | POA: Insufficient documentation

## 2016-07-25 DIAGNOSIS — Z992 Dependence on renal dialysis: Secondary | ICD-10-CM | POA: Diagnosis not present

## 2016-07-25 DIAGNOSIS — I739 Peripheral vascular disease, unspecified: Secondary | ICD-10-CM | POA: Diagnosis not present

## 2016-07-25 DIAGNOSIS — K219 Gastro-esophageal reflux disease without esophagitis: Secondary | ICD-10-CM | POA: Diagnosis not present

## 2016-07-25 DIAGNOSIS — Z8673 Personal history of transient ischemic attack (TIA), and cerebral infarction without residual deficits: Secondary | ICD-10-CM | POA: Diagnosis not present

## 2016-07-25 DIAGNOSIS — I129 Hypertensive chronic kidney disease with stage 1 through stage 4 chronic kidney disease, or unspecified chronic kidney disease: Secondary | ICD-10-CM | POA: Diagnosis not present

## 2016-07-25 DIAGNOSIS — Z87891 Personal history of nicotine dependence: Secondary | ICD-10-CM | POA: Diagnosis not present

## 2016-07-25 DIAGNOSIS — E1122 Type 2 diabetes mellitus with diabetic chronic kidney disease: Secondary | ICD-10-CM | POA: Insufficient documentation

## 2016-07-25 HISTORY — PX: AV FISTULA PLACEMENT: SHX1204

## 2016-07-25 LAB — POCT I-STAT 4, (NA,K, GLUC, HGB,HCT)
Glucose, Bld: 99 mg/dL (ref 65–99)
HCT: 27 % — ABNORMAL LOW (ref 39.0–52.0)
Hemoglobin: 9.2 g/dL — ABNORMAL LOW (ref 13.0–17.0)
Potassium: 4.5 mmol/L (ref 3.5–5.1)
SODIUM: 145 mmol/L (ref 135–145)

## 2016-07-25 LAB — GLUCOSE, CAPILLARY
GLUCOSE-CAPILLARY: 105 mg/dL — AB (ref 65–99)
Glucose-Capillary: 94 mg/dL (ref 65–99)

## 2016-07-25 SURGERY — ARTERIOVENOUS (AV) FISTULA CREATION
Anesthesia: Monitor Anesthesia Care | Site: Arm Lower | Laterality: Right

## 2016-07-25 MED ORDER — FENTANYL CITRATE (PF) 250 MCG/5ML IJ SOLN
INTRAMUSCULAR | Status: AC
  Filled 2016-07-25: qty 5

## 2016-07-25 MED ORDER — HEMOSTATIC AGENTS (NO CHARGE) OPTIME
TOPICAL | Status: DC | PRN
  Administered 2016-07-25: 1 via TOPICAL

## 2016-07-25 MED ORDER — PHENYLEPHRINE 40 MCG/ML (10ML) SYRINGE FOR IV PUSH (FOR BLOOD PRESSURE SUPPORT)
PREFILLED_SYRINGE | INTRAVENOUS | Status: AC
  Filled 2016-07-25: qty 10

## 2016-07-25 MED ORDER — FENTANYL CITRATE (PF) 100 MCG/2ML IJ SOLN: 25.0000 ug | INTRAMUSCULAR | Status: DC | PRN

## 2016-07-25 MED ORDER — SODIUM CHLORIDE 0.9 % IV SOLN: INTRAVENOUS | Status: DC

## 2016-07-25 MED ORDER — PROPOFOL 500 MG/50ML IV EMUL
INTRAVENOUS | Status: DC | PRN
  Administered 2016-07-25: 50 ug/kg/min via INTRAVENOUS

## 2016-07-25 MED ORDER — SODIUM CHLORIDE 0.9 % IV SOLN
INTRAVENOUS | Status: DC | PRN
  Administered 2016-07-25: 11:00:00

## 2016-07-25 MED ORDER — DEXTROSE 5 % IV SOLN
1.5000 g | INTRAVENOUS | Status: AC
  Administered 2016-07-25: 1.5 g via INTRAVENOUS

## 2016-07-25 MED ORDER — PROTAMINE SULFATE 10 MG/ML IV SOLN
INTRAVENOUS | Status: DC | PRN
  Administered 2016-07-25: 25 mg via INTRAVENOUS

## 2016-07-25 MED ORDER — DEXTROSE 5 % IV SOLN
INTRAVENOUS | Status: AC
  Filled 2016-07-25: qty 1.5

## 2016-07-25 MED ORDER — HEPARIN SODIUM (PORCINE) 1000 UNIT/ML IJ SOLN
INTRAMUSCULAR | Status: DC | PRN
  Administered 2016-07-25: 3000 [IU] via INTRAVENOUS

## 2016-07-25 MED ORDER — PROPOFOL 10 MG/ML IV BOLUS
INTRAVENOUS | Status: DC | PRN
  Administered 2016-07-25: 20 mg via INTRAVENOUS

## 2016-07-25 MED ORDER — PROMETHAZINE HCL 25 MG/ML IJ SOLN: 6.2500 mg | INTRAMUSCULAR | Status: DC | PRN

## 2016-07-25 MED ORDER — MIDAZOLAM HCL 2 MG/2ML IJ SOLN: 0.5000 mg | Freq: Once | INTRAMUSCULAR | Status: DC | PRN

## 2016-07-25 MED ORDER — SODIUM CHLORIDE 0.9 % IV SOLN
INTRAVENOUS | Status: DC
Start: 2016-07-25 — End: 2016-07-25
  Administered 2016-07-25 (×2): via INTRAVENOUS

## 2016-07-25 MED ORDER — LIDOCAINE-EPINEPHRINE (PF) 1 %-1:200000 IJ SOLN
INTRAMUSCULAR | Status: DC | PRN
  Administered 2016-07-25: 30 mL

## 2016-07-25 MED ORDER — ONDANSETRON HCL 4 MG/2ML IJ SOLN
INTRAMUSCULAR | Status: DC | PRN
  Administered 2016-07-25: 4 mg via INTRAVENOUS

## 2016-07-25 MED ORDER — ONDANSETRON HCL 4 MG/2ML IJ SOLN
INTRAMUSCULAR | Status: AC
  Filled 2016-07-25: qty 2

## 2016-07-25 MED ORDER — 0.9 % SODIUM CHLORIDE (POUR BTL) OPTIME
TOPICAL | Status: DC | PRN
  Administered 2016-07-25: 1000 mL

## 2016-07-25 MED ORDER — PHENYLEPHRINE HCL 10 MG/ML IJ SOLN
INTRAVENOUS | Status: DC | PRN
  Administered 2016-07-25: 25 ug/min via INTRAVENOUS

## 2016-07-25 MED ORDER — MEPERIDINE HCL 25 MG/ML IJ SOLN: 6.2500 mg | INTRAMUSCULAR | Status: DC | PRN

## 2016-07-25 MED ORDER — PROPOFOL 10 MG/ML IV BOLUS
INTRAVENOUS | Status: AC
  Filled 2016-07-25: qty 20

## 2016-07-25 MED ORDER — LIDOCAINE-EPINEPHRINE (PF) 1 %-1:200000 IJ SOLN
INTRAMUSCULAR | Status: AC
  Filled 2016-07-25: qty 30

## 2016-07-25 MED ORDER — OXYCODONE-ACETAMINOPHEN 5-325 MG PO TABS: 1.0000 | ORAL_TABLET | Freq: Four times a day (QID) | ORAL | 0 refills | Status: DC | PRN

## 2016-07-25 MED ORDER — CHLORHEXIDINE GLUCONATE CLOTH 2 % EX PADS: 6.0000 | MEDICATED_PAD | Freq: Once | CUTANEOUS | Status: DC

## 2016-07-25 SURGICAL SUPPLY — 35 items
ARMBAND PINK RESTRICT EXTREMIT (MISCELLANEOUS) ×6 IMPLANT
CANISTER SUCT 3000ML PPV (MISCELLANEOUS) ×3 IMPLANT
CLIP TI MEDIUM 6 (CLIP) ×3 IMPLANT
CLIP TI WIDE RED SMALL 6 (CLIP) ×3 IMPLANT
COVER PROBE W GEL 5X96 (DRAPES) ×3 IMPLANT
DERMABOND ADVANCED (GAUZE/BANDAGES/DRESSINGS) ×2
DERMABOND ADVANCED .7 DNX12 (GAUZE/BANDAGES/DRESSINGS) ×1 IMPLANT
ELECT REM PT RETURN 9FT ADLT (ELECTROSURGICAL) ×3
ELECTRODE REM PT RTRN 9FT ADLT (ELECTROSURGICAL) ×1 IMPLANT
GLOVE BIO SURGEON STRL SZ 6.5 (GLOVE) ×2 IMPLANT
GLOVE BIO SURGEONS STRL SZ 6.5 (GLOVE) ×1
GLOVE BIOGEL PI IND STRL 6.5 (GLOVE) ×2 IMPLANT
GLOVE BIOGEL PI IND STRL 7.0 (GLOVE) ×2 IMPLANT
GLOVE BIOGEL PI IND STRL 7.5 (GLOVE) ×1 IMPLANT
GLOVE BIOGEL PI INDICATOR 6.5 (GLOVE) ×4
GLOVE BIOGEL PI INDICATOR 7.0 (GLOVE) ×4
GLOVE BIOGEL PI INDICATOR 7.5 (GLOVE) ×2
GLOVE SURG SS PI 6.5 STRL IVOR (GLOVE) ×6 IMPLANT
GLOVE SURG SS PI 7.5 STRL IVOR (GLOVE) ×3 IMPLANT
GOWN STRL REUS W/ TWL LRG LVL3 (GOWN DISPOSABLE) ×2 IMPLANT
GOWN STRL REUS W/ TWL XL LVL3 (GOWN DISPOSABLE) ×1 IMPLANT
GOWN STRL REUS W/TWL LRG LVL3 (GOWN DISPOSABLE) ×4
GOWN STRL REUS W/TWL XL LVL3 (GOWN DISPOSABLE) ×2
HEMOSTAT SNOW SURGICEL 2X4 (HEMOSTASIS) ×3 IMPLANT
KIT BASIN OR (CUSTOM PROCEDURE TRAY) ×3 IMPLANT
KIT ROOM TURNOVER OR (KITS) ×3 IMPLANT
NS IRRIG 1000ML POUR BTL (IV SOLUTION) ×3 IMPLANT
PACK CV ACCESS (CUSTOM PROCEDURE TRAY) ×3 IMPLANT
PAD ARMBOARD 7.5X6 YLW CONV (MISCELLANEOUS) ×6 IMPLANT
SUT PROLENE 6 0 CC (SUTURE) ×6 IMPLANT
SUT VIC AB 3-0 SH 27 (SUTURE) ×2
SUT VIC AB 3-0 SH 27X BRD (SUTURE) ×1 IMPLANT
SUT VICRYL 4-0 PS2 18IN ABS (SUTURE) ×3 IMPLANT
UNDERPAD 30X30 (UNDERPADS AND DIAPERS) ×3 IMPLANT
WATER STERILE IRR 1000ML POUR (IV SOLUTION) ×3 IMPLANT

## 2016-07-25 NOTE — Anesthesia Postprocedure Evaluation (Signed)
Anesthesia Post Note  Patient: GABINO HAGIN  Procedure(s) Performed: Procedure(s) (LRB): ARTERIOVENOUS (AV) FISTULA CREATION-RIGHT (Right)     Patient location during evaluation: PACU Anesthesia Type: MAC Level of consciousness: awake and alert, patient cooperative and oriented Pain management: pain level controlled Vital Signs Assessment: post-procedure vital signs reviewed and stable Respiratory status: spontaneous breathing, nonlabored ventilation and respiratory function stable Cardiovascular status: blood pressure returned to baseline and stable Postop Assessment: no signs of nausea or vomiting Anesthetic complications: no    Last Vitals:  Vitals:   07/25/16 1215 07/25/16 1230  BP: (!) 132/53 (!) 133/54  Pulse: (!) 53 (!) 55  Resp: 18 17  Temp:      Last Pain:  Vitals:   07/25/16 0817  TempSrc: Oral                 Daphyne Miguez,E. Janiylah Hannis

## 2016-07-25 NOTE — Interval H&P Note (Signed)
History and Physical Interval Note:  07/25/2016 9:40 AM  Robert [name]  has presented today for surgery, with the diagnosis of Chronic Kidney Disease Stage 5  N18.5  The various methods of treatment have been discussed with the patient and family. After consideration of risks, benefits and other options for treatment, the patient has consented to  Procedure(s): ARTERIOVENOUS (AV) FISTULA CREATION-RIGHT (Right) as a surgical intervention .  The patient's history has been reviewed, patient examined, no change in status, stable for surgery.  I have reviewed the patient's chart and labs.  Questions were answered to the patient's satisfaction.     [name]

## 2016-07-25 NOTE — Op Note (Addendum)
    Patient name: Robert [name] MRN: [id number] DOB: 05-23-1935 Sex: male  07/25/2016 Pre-operative Diagnosis: CKD Post-operative diagnosis:  Same Surgeon:  [name] Assistants:  Silva Bandy Procedure:   Right radio cephalic AV fistula Anesthesia:  MAC Blood Loss:  See anesthesia record Specimens:  none  Findings:  34m vein and artery.  The artery was calcified  Indications:  The patient is here for fistula creation  Procedure:  The patient was identified in the holding [place] and [place]  The patient was then placed supine on the table. MAC anesthesia was administered.  The patient was prepped and draped in the usual sterile fashion.  A time out was called and antibiotics were administered.  U/S was used to evaluate the right cephalic vein.  It apeared adequate for fistula creation.  At the elbow, it drained into the basilic system.  1% local anesthesia was used.  A longitudinal incision was made at the wrist.  I dissected out the cephalic vein and radial artery.  The vein was 361mas was the artery, which was moderately calcified..  Side branches on the vein were divided.  3000 units of heparin was given.  The vein was divided distally.  It distended nicely with heparinized saline.  The radial artery was then occluded.  A # 11 blade was used to make a arteriotomy which was extended longitudinally with Potts scissors.  The vein was spatulated.  A end to side anastamosis was created with 6-0 prolene.  Prior to completion, the appropriate flushing maneuvers were performed, and the anastamosis was completed.  I inspected the course of the vein to make sure there no kinks.  There was a good thrill in the fistula and multiphasic signals in the distal  radial artery.  25 mg of protamine was given.  Once hemostasis was adequate, the incision was closed with 2 layers of 3-0 vicryl, followed by dermabond.   Disposition:  To PACU stable   V. WeAnnamarie MajorM.D. [facility] [place] [facility]: [phone]:  [phone]

## 2016-07-25 NOTE — Anesthesia Preprocedure Evaluation (Addendum)
Anesthesia Evaluation  Patient identified by MRN, date of birth, ID band Patient awake    Reviewed: Allergy & Precautions, NPO status , Patient's Chart, lab work & pertinent test results, reviewed documented beta blocker date and time   History of Anesthesia Complications Negative for: history of anesthetic complications  Airway Mallampati: II  TM Distance: >3 FB Neck ROM: Full    Dental  (+) Dental Advisory Given   Pulmonary COPD,  COPD inhaler, former smoker,    breath sounds clear to auscultation       Cardiovascular hypertension, Pt. on medications and Pt. on home beta blockers (-) angina+ CAD, + Past MI (2011), + Cardiac Stents (2011) and + Peripheral Vascular Disease   Rhythm:Regular Rate:Normal  3/18 ECHO: EF 55-60%, valves OK   Neuro/Psych CVA (L hand weakness), Residual Symptoms    GI/Hepatic Neg liver ROS, GERD  Medicated and Controlled,  Endo/Other  diabetes (glu 99), Insulin Dependent  Renal/GU K+ 4.5     Musculoskeletal   Abdominal   Peds  Hematology plavix   Anesthesia Other Findings   Reproductive/Obstetrics                            Anesthesia Physical Anesthesia Plan  ASA: III  Anesthesia Plan:    Post-op Pain Management:    Induction:   Airway Management Planned: Natural Airway and Simple Face Mask  Additional Equipment:   Intra-op Plan:   Post-operative Plan:   Informed Consent: I have reviewed the patients History and Physical, chart, labs and discussed the procedure including the risks, benefits and alternatives for the proposed anesthesia with the patient or authorized representative who has indicated his/her understanding and acceptance.   Dental advisory given  Plan Discussed with: CRNA and Surgeon  Anesthesia Plan Comments: (Plan routine monitors, MAC)        Anesthesia Quick Evaluation

## 2016-07-25 NOTE — Discharge Instructions (Signed)
General Anesthesia, Adult, Care After °These instructions provide you with information about caring for yourself after your procedure. Your health care provider may also give you more specific instructions. Your treatment has been planned according to current medical practices, but problems sometimes occur. Call your health care provider if you have any problems or questions after your procedure. °What can I expect after the procedure? °After the procedure, it is common to have: °· Vomiting. °· A sore throat. °· Mental slowness. ° °It is common to feel: °· Nauseous. °· Cold or shivery. °· Sleepy. °· Tired. °· Sore or achy, even in parts of your body where you did not have surgery. ° °Follow these instructions at home: °For at least 24 hours after the procedure: °· Do not: °? Participate in activities where you could fall or become injured. °? Drive. °? Use heavy machinery. °? Drink alcohol. °? Take sleeping pills or medicines that cause drowsiness. °? Make important decisions or sign legal documents. °? Take care of children on your own. °· Rest. °Eating and drinking °· If you vomit, drink water, juice, or soup when you can drink without vomiting. °· Drink enough fluid to keep your urine clear or pale yellow. °· Make sure you have little or no nausea before eating solid foods. °· Follow the diet recommended by your health care provider. °General instructions °· Have a responsible adult stay with you until you are awake and alert. °· Return to your normal activities as told by your health care provider. Ask your health care provider what activities are safe for you. °· Take over-the-counter and prescription medicines only as told by your health care provider. °· If you smoke, do not smoke without supervision. °· Keep all follow-up visits as told by your health care provider. This is important. °Contact a health care provider if: °· You continue to have nausea or vomiting at home, and medicines are not helpful. °· You  cannot drink fluids or start eating again. °· You cannot urinate after 8-12 hours. °· You develop a skin rash. °· You have fever. °· You have increasing redness at the site of your procedure. °Get help right away if: °· You have difficulty breathing. °· You have chest pain. °· You have unexpected bleeding. °· You feel that you are having a life-threatening or urgent problem. °This information is not intended to replace advice given to you by your health care provider. Make sure you discuss any questions you have with your health care provider. °Document Released: 05/20/2000 Document Revised: 07/17/2015 Document Reviewed: 01/26/2015 °Elsevier Interactive Patient Education © 2018 Elsevier Inc. ° °

## 2016-07-25 NOTE — Transfer of Care (Signed)
Immediate Anesthesia Transfer of Care Note  Patient: Robert [name]  Procedure(s) Performed: Procedure(s): ARTERIOVENOUS (AV) FISTULA CREATION-RIGHT (Right)  Patient Location: PACU  Anesthesia Type:MAC  Level of Consciousness: awake and alert   Airway & Oxygen Therapy: Patient Spontanous Breathing  Post-op Assessment: Report given to RN, Post -op Vital signs reviewed and stable and Patient moving all extremities X 4  Post vital signs: Reviewed and stable  Last Vitals:  Vitals:   07/25/16 0817 07/25/16 1115  BP: (!) 147/48 125/62  Pulse: (!) 57 60  Resp: 20 (!) 21  Temp: 36.4 C 36.4 C    Last Pain:  Vitals:   07/25/16 0817  TempSrc: Oral         Complications: No apparent anesthesia complications

## 2016-07-25 NOTE — H&P (View-Only) (Signed)
Patient name: LINARD DAFT MRN: [id number] DOB: 1935-08-30 Sex: male  REASON FOR VISIT:    #1: post op carotid #2:  Chronic renal insufficiency, stage 5  HISTORY OF PRESENT ILLNESS:   KAIRE STARY is a 81 y.o. male, who Presented to the hospital on 05/20/2016 with slurred speech.  He was also felt to have a left facial droop.  The patient does have a history of a prior stroke with similar symptoms.  A noncontrast CT scan was negative for acute pathology.  He was not given TPA.  Carotid Doppler studies show 60-79% right carotid stenosis.  His MRI was positive for right-sided infarct.  He was hypertensive on arrival with blood pressure of 210/95.  On 06/14/2016, he underwent right carotid endarterectomy with patch angioplasty.  Intraoperative findings included a internal carotid 180 rotated.  The distal endpoint of the plaque was high up under the hypoglossal.  This was done without a shunt because of poor distal visualization.  He was discharged home on postoperative day 2.  He stated today because of nausea and the inability to void.  Today he is still having difficulty using his left arm.  He has the inability to pick up objects or to move his fingers.  His swallowing is back to baseline and he passed a swallowing evaluation.  The patient suffers from diabetes which has not been well controlled.  He takes a statin for hypercholesterolemia.  He is medically managed for hypertension.  He also suffers from stage IV renal insufficiency.  He has been evaluated for dialysis access in the past, but refused.  He is now closer to needing to start dialysis and is interested in proceeding with access placement.  CURRENT MEDICATIONS:    Current Outpatient Prescriptions  Medication Sig Dispense Refill  . albuterol (PROVENTIL HFA;VENTOLIN HFA) 108 (90 Base) MCG/ACT inhaler Inhale 2 puffs into the lungs every 6 (six) hours as needed for wheezing or shortness of  breath.    [name] aspirin 325 MG EC tablet Take 325 mg by mouth daily.    . calcitRIOL (ROCALTROL) 0.25 MCG capsule Take 0.25 mcg by mouth every Monday, Wednesday, and Friday.    . clopidogrel (PLAVIX) 75 MG tablet Take 1 tablet (75 mg total) by mouth daily. 30 tablet 1  . ezetimibe (ZETIA) 10 MG tablet Take 1 tablet (10 mg total) by mouth daily. 30 tablet 1  . furosemide (LASIX) 40 MG tablet Take 40 mg by mouth 2 (two) times daily.    . hydrALAZINE (APRESOLINE) 100 MG tablet Take 100 mg by mouth 3 (three) times daily.     . insulin aspart (NOVOLOG) 100 UNIT/ML injection CBG < 70: implement hypoglycemia protocol  CBG 70 - 120: 0 units  CBG 121 - 150: 2 units  CBG 151 - 200: 3 units  CBG 201 - 250: 5 units  CBG 251 - 300: 8 units  CBG 301 - 350: 11 units  CBG 351 - 400: 15 units  CBG > 400 call MD and obtain STAT lab verification 10 mL 11  . insulin glargine (LANTUS) 100 UNIT/ML injection Inject 0.35 mLs (35 Units total) into the skin at bedtime. 10 mL 11  . levothyroxine (SYNTHROID, LEVOTHROID) 50 MCG tablet Take 50 mcg by mouth daily before breakfast.     . metoprolol succinate (TOPROL-XL) 50 MG 24 hr tablet Take 50 mg by mouth 2 (two) times daily.     . Multiple Vitamins-Minerals (MULTIVITAMIN WITH MINERALS) tablet Take 1 tablet  by mouth daily.    [name] omeprazole (PRILOSEC) 20 MG capsule Take 1 capsule by mouth Daily.    [name] oxyCODONE-acetaminophen (PERCOCET/ROXICET) 5-325 MG tablet Take 1-2 tablets by mouth every 6 (six) hours as needed for moderate pain. 8 tablet 0  . simvastatin (ZOCOR) 40 MG tablet Take 1 tablet (40 mg total) by mouth daily at 6 PM. 30 tablet 0   No current facility-administered medications for this visit.     REVIEW OF SYSTEMS:   [X]  denotes positive finding, [ ]  denotes negative finding Cardiac  Comments:  Chest pain or chest pressure:    Shortness of breath upon exertion:    Short of breath when lying flat:    Irregular heart rhythm:    Constitutional    Fever or  chills:      PHYSICAL EXAM:   There were no vitals filed for this visit.  GENERAL: The patient is a well-nourished male, in no acute distress. The vital signs are documented above. CARDIOVASCULAR: There is a regular rate and rhythm. PULMONARY: Non-labored respirations Right carotid incision is healing nicely.  STUDIES:   I have reviewed his vein mapping which shows adequate sized right-sided cephalic vein.  The left side is too small   MEDICAL ISSUES:   Carotid stenosis: The patient is status post right carotid endarterectomy for stroke.  He appears to be recovering nicely.  He still has deficits in the left arm which have only made minimal improvement.  He is continuing to get outpatient therapy.  Renal disease: The patient's renal function has worsened.  He does not have access in place currently.  We discussed proceeding with a right-sided fistula.  I suspect I would proceed with a right radiocephalic fistula but may have to go more central with a brachiocephalic fistula or even a first stage basilic vein procedure.  I did discuss this with the patient he understands the risks and benefits of the procedure which include but are not limited to the risk of steal and non-maturity.  I would like for him to continue with therapy for his stroke for at least another subcutaneous weeks since his operation will need to be on his good hand.  Therefore I have scheduled his fistula placement for Thursday, May 31.  I will stop his Plavix 5 days prior.  [name], MD [facility] [phone] Pager [phone]

## 2016-07-26 ENCOUNTER — Encounter (HOSPITAL_COMMUNITY): Payer: Self-pay | Admitting: Surgery

## 2016-07-26 ENCOUNTER — Encounter (HOSPITAL_COMMUNITY)
Admission: RE | Admit: 2016-07-26 | Discharge: 2016-07-26 | Disposition: A | Discharge status: Home / Self Care | Payer: Medicare Other | Source: Ambulatory Visit | Attending: Nephrology | Admitting: Nephrology

## 2016-07-26 DIAGNOSIS — D638 Anemia in other chronic diseases classified elsewhere: Secondary | ICD-10-CM | POA: Insufficient documentation

## 2016-07-26 DIAGNOSIS — D631 Anemia in chronic kidney disease: Secondary | ICD-10-CM

## 2016-07-26 DIAGNOSIS — N184 Chronic kidney disease, stage 4 (severe): Secondary | ICD-10-CM | POA: Diagnosis present

## 2016-07-26 LAB — IRON AND TIBC
Iron: 14 ug/dL — ABNORMAL LOW (ref 45–182)
SATURATION RATIOS: 5 % — AB (ref 17.9–39.5)
TIBC: 269 ug/dL (ref 250–450)
UIBC: 255 ug/dL

## 2016-07-26 LAB — FERRITIN: Ferritin: 53 ng/mL (ref 24–336)

## 2016-07-26 LAB — POCT HEMOGLOBIN-HEMACUE: HEMOGLOBIN: 9.3 g/dL — AB (ref 13.0–17.0)

## 2016-07-26 MED ORDER — DARBEPOETIN ALFA 100 MCG/0.5ML IJ SOSY
100.0000 ug | PREFILLED_SYRINGE | INTRAMUSCULAR | Status: DC
  Administered 2016-07-26: 11:00:00 100 ug via SUBCUTANEOUS

## 2016-07-26 MED ORDER — DARBEPOETIN ALFA 100 MCG/0.5ML IJ SOSY
PREFILLED_SYRINGE | INTRAMUSCULAR | Status: AC
  Filled 2016-07-26: qty 0.5

## 2016-07-31 ENCOUNTER — Encounter: Payer: Self-pay | Admitting: Neurology

## 2016-07-31 ENCOUNTER — Ambulatory Visit (INDEPENDENT_AMBULATORY_CARE_PROVIDER_SITE_OTHER): Payer: Medicare Other | Admitting: Neurology

## 2016-07-31 VITALS — BP 133/63 | HR 60 | Wt 183.2 lb

## 2016-07-31 DIAGNOSIS — I6521 Occlusion and stenosis of right carotid artery: Secondary | ICD-10-CM

## 2016-07-31 NOTE — Progress Notes (Signed)
[facility] [address] [place]. [place] [address] [phone]       OFFICE FOLLOW-UP NOTE  Robert. Robert [name] Date of Birth:  07/11/1935 Medical Record Number:  [id number]   HPI: Robert [name] is a 81 year male seen today for first office follow-up visit following hospital admission for stroke in March 2018. Is a complaint by his son. History is obtained from the patient, son, review of Hospital medical records and have personally reviewed imaging films. Robert [name] is an 81 y.o. male who was LKN at "about 1:30 before taking a nap". On awakening, his voice was hoarse. He drove to his daughter's house; she felt that his speech was slurred and that he was having a stroke. She drove him to the [facility] ED where a Code Stroke was called.  At time of CT scan, the patient denied any weakness. He also denied vision changes. The patient stated that he has acid reflux but has not become hoarse from this in the past. LSN: 1:30 PM on 05/20/2016.tPA Given: No, out of IV tPA time window.NIHSS: 2 (mild dysarthria and left hand sensory impairment) MRI scan of the brain showed scattered right MCA punctate infarcts which are felt to be embolic. MRA of the brain showed moderate bilateral cavernous carotid atherosclerotic changes. Carotid Doppler showed 60-79% right ICA stenosis which appeared to her have progressed since previous carotid ultrasound from March 2017 when it was 40-59% stenosis. Lower extremity venous Dopplers were negative for DVT. Transthoracic echo showed normal ejection fraction. Patient had a loop interpretation from loop recorder which was previously placed in prior admission which did not show it to fibrillation. LDL cholesterol could not be calculated due to significantly elevated triglycerides of 749 mg percent. Urine drug screen was negative. Hemoglobin A1c was elevated at 8.3. Patient was previously on aspirin and Plavix was added for stroke prevention. Patient underwent  elective right carotid endarterectomy by Dr. Trula Slade for few weeks later which was uneventful. [facility] she'll get ongoing therapies. He is currently at home and getting home physical and occupation therapy. Is opting improvement in his speech and facial weakness but left hand weakness persist. He is tolerating aspirin and Plavix with minor bruising but no bleeding. His blood pressure is well controlled usually in today to 133/60. Is tolerating Zocor well without muscle aches or pains. His fasting sugars have all ranged in the 80-90 range mostly. He had surgery for creation of dialysis fistula in the right arm by Dr. Trula Slade a few weeks ago in anticipation for dialysis ROS:   14 system review of systems is positive for  fatigue, chills, hearing loss, walking difficulty, hand weakness, memory loss and all other systems negative  PMH:  Past Medical History:  Diagnosis Date  . Allergy    year round  . Cancer (HCC)    PSA ELEVATED  . Carotid artery occlusion   . Cataract   . Chronic kidney disease    DR MATTINGLY   . Diabetes mellitus   . Dyspnea   . GERD (gastroesophageal reflux disease)   . Hearing deficit   . History of kidney stones   . Hyperlipidemia   . Hypertension   . Myocardial infarction ([place]) 2011  . Stroke (cerebrum) (HCC)    weakness in left hand  . Thyroid disease    hypothyroid    Social History:  Social History   Social History  . Marital status: Married  Spouse name: N/A  . Number of children: N/A  . Years of education: N/A   Occupational History  . Not on file.   Social History Main Topics  . Smoking status: Former Research scientist (life sciences)  . Smokeless tobacco: Never Used  . Alcohol use 1.2 oz/week    2 Glasses of wine per week     Comment: rarely  . Drug use: No  . Sexual activity: Not on file   Other Topics Concern  . Not on file   Social History Narrative  . No narrative on file    Medications:   Current Outpatient  Prescriptions on File Prior to Visit  Medication Sig Dispense Refill  . albuterol (PROVENTIL HFA;VENTOLIN HFA) 108 (90 Base) MCG/ACT inhaler Inhale 2 puffs into the lungs every 6 (six) hours as needed for wheezing or shortness of breath.    [name] aspirin 325 MG EC tablet Take 325 mg by mouth daily.    . clopidogrel (PLAVIX) 75 MG tablet Take 1 tablet (75 mg total) by mouth daily. 30 tablet 1  . ezetimibe (ZETIA) 10 MG tablet Take 1 tablet (10 mg total) by mouth daily. (Patient taking differently: Take 10 mg by mouth every evening. ) 30 tablet 1  . fexofenadine (ALLEGRA) 180 MG tablet Take 180 mg by mouth daily.    . furosemide (LASIX) 40 MG tablet Take 40 mg by mouth 2 (two) times daily.    [name] HUMALOG 100 UNIT/ML injection 30 units basal over 24 hours and bolus is up to 36 units per 24 hours in VGO 30  5  . hydrALAZINE (APRESOLINE) 100 MG tablet Take 100 mg by mouth 3 (three) times daily.     . Insulin Disposable Pump (V-GO 30) KIT as directed.     [name] levothyroxine (SYNTHROID, LEVOTHROID) 50 MCG tablet Take 50 mcg by mouth daily before breakfast.     . metoprolol succinate (TOPROL-XL) 50 MG 24 hr tablet Take 50 mg by mouth 2 (two) times daily.     . Multiple Vitamins-Minerals (MULTIVITAMIN WITH MINERALS) tablet Take 1 tablet by mouth daily.    [name] omeprazole (PRILOSEC) 20 MG capsule Take 20 mg by mouth every evening.     [name] oxyCODONE-acetaminophen (PERCOCET/ROXICET) 5-325 MG tablet Take 1-2 tablets by mouth every 6 (six) hours as needed for moderate pain. 6 tablet 0  . sertraline (ZOLOFT) 25 MG tablet Take 25 mg by mouth daily.    . simvastatin (ZOCOR) 40 MG tablet Take 1 tablet (40 mg total) by mouth daily at 6 PM. 30 tablet 0   No current facility-administered medications on file prior to visit.     Allergies:   Allergies  Allergen Reactions  . Lisinopril Other (See Comments)    "made me lose my voice"    Physical Exam General: well developed, well nourished elderly Caucasian male, seated, in  no evident distress Head: head normocephalic and atraumatic.  Neck: supple with no carotid or supraclavicular bruits Cardiovascular: regular rate and rhythm, no murmurs Musculoskeletal: no deformity Skin:  no rash/petichiae Vascular:  Normal pulses all extremities Vitals:   07/31/16 1449  BP: 133/63  Pulse: 60   Neurologic Exam Mental Status: Awake and fully alert. Oriented to place and time. Recent and remote memory intact. Attention span, concentration and fund of knowledge appropriate. Mood and affect appropriate.  Cranial Nerves: Fundoscopic exam reveals sharp disc margins. Pupils equal, briskly reactive to light. Extraocular movements full without nystagmus. Visual fields full to confrontation. Hearing intact. Facial sensation intact.  Face, tongue, palate moves normally and symmetrically.  Motor: Normal bulk and tone. Normal strength in all tested extremity muscles except significant weakness of left grip and intrinsic hand muscles and wrist flexion and extension. Diminished fine finger movements on the left. Orbits right over left approximately.. Sensory.: slightly diminished intact to touch ,pinprick .position and vibratory sensation in left hand only.  Coordination: Rapid alternating movements normal in all extremities. Finger-to-nose and heel-to-shin performed accurately bilaterally. Gait and Station: Arises from chair without difficulty. Stance is normal. Gait demonstrates normal stride length and balance . Able to heel, toe and tandem walk without difficulty.  Reflexes: 1+ and symmetric. Toes downgoing.   NIHSS  1 Modified Rankin  2  ASSESSMENT: 82 year Caucasian male with a right MCA branch embolic infarcts from proximal right carotid stenosis status post carotid endarterectomy electively. He has mild residual left hand weakness. Vascular risk factors of diabetes, hypertension, hyperlipidemia and extracranial carotid stenosis.    PLAN:  I had a long d/w patient about his  recent stroke, risk for recurrent stroke/TIAs, personally independently reviewed imaging studies and stroke evaluation results and answered questions.Continue clopidogrel 75 mg daily  for secondary stroke prevention  and discontinue aspirin after discussion with his cardiologist as I do not believe due to a long-term antiplatelet therapy is beneficial for stroke prevention and maintain strict control of hypertension with blood pressure goal below 130/90, diabetes with hemoglobin A1c goal below 6.5% and lipids with LDL cholesterol goal below 70 mg/dL. I also advised the patient to eat a healthy diet with plenty of whole grains, cereals, fruits and vegetables, exercise regularly and maintain ideal body weight .continue ongoing occupational therapy for his left hand. Patient may consider possible participation in the PREMIERS stroke prevention trial if interested. Followup in the future with me in 6 months or call earlier if necessary Greater than 50% of time during this 25 minute visit was spent on counseling,explanation of diagnosis, planning of further management, discussion with patient and family and coordination of care Antony Contras, MD  [facility] [address] [place], [place] [address]  Phone [phone] Fax [phone] Note: This document was prepared with digital dictation and possible smart phrase technology. Any transcriptional errors that result from this process are unintentional

## 2016-07-31 NOTE — Patient Instructions (Signed)
I had a long d/w patient about his recent stroke, risk for recurrent stroke/TIAs, personally independently reviewed imaging studies and stroke evaluation results and answered questions.Continue clopidogrel 75 mg daily  for secondary stroke prevention  and discontinue aspirin after discussion with his cardiologist as I do not believe due to a long-term antiplatelet therapy is beneficial for stroke prevention and maintain strict control of hypertension with blood pressure goal below 130/90, diabetes with hemoglobin A1c goal below 6.5% and lipids with LDL cholesterol goal below 70 mg/dL. I also advised the patient to eat a healthy diet with plenty of whole grains, cereals, fruits and vegetables, exercise regularly and maintain ideal body weight .continue ongoing occupational therapy for his left hand. Patient may consider possible participation in the PREMIERS stroke prevention trial if interested. Followup in the future with me in 6 months or call earlier if necessary  Stroke Prevention Some medical conditions and behaviors are associated with an increased chance of having a stroke. You may prevent a stroke by making healthy choices and managing medical conditions. How can I reduce my risk of having a stroke?  Stay physically active. Get at least 30 minutes of activity on most or all days.  Do not smoke. It may also be helpful to avoid exposure to secondhand smoke.  Limit alcohol use. Moderate alcohol use is considered to be: ? No more than 2 drinks per day for men. ? No more than 1 drink per day for nonpregnant women.  Eat healthy foods. This involves: ? Eating 5 or more servings of fruits and vegetables a day. ? Making dietary changes that address high blood pressure (hypertension), high cholesterol, diabetes, or obesity.  Manage your cholesterol levels. ? Making food choices that are high in fiber and low in saturated fat, trans fat, and cholesterol may control cholesterol levels. ? Take any  prescribed medicines to control cholesterol as directed by your health care provider.  Manage your diabetes. ? Controlling your carbohydrate and sugar intake is recommended to manage diabetes. ? Take any prescribed medicines to control diabetes as directed by your health care provider.  Control your hypertension. ? Making food choices that are low in salt (sodium), saturated fat, trans fat, and cholesterol is recommended to manage hypertension. ? Ask your health care provider if you need treatment to lower your blood pressure. Take any prescribed medicines to control hypertension as directed by your health care provider. ? If you are 69-37 years of age, have your blood pressure checked every 3-5 years. If you are 51 years of age or older, have your blood pressure checked every year.  Maintain a healthy weight. ? Reducing calorie intake and making food choices that are low in sodium, saturated fat, trans fat, and cholesterol are recommended to manage weight.  Stop drug abuse.  Avoid taking birth control pills. ? Talk to your health care provider about the risks of taking birth control pills if you are over 28 years old, smoke, get migraines, or have ever had a blood clot.  Get evaluated for sleep disorders (sleep apnea). ? Talk to your health care provider about getting a sleep evaluation if you snore a lot or have excessive sleepiness.  Take medicines only as directed by your health care provider. ? For some people, aspirin or blood thinners (anticoagulants) are helpful in reducing the risk of forming abnormal blood clots that can lead to stroke. If you have the irregular heart rhythm of atrial fibrillation, you should be on a blood thinner unless  there is a good reason you cannot take them. ? Understand all your medicine instructions.  Make sure that other conditions (such as anemia or atherosclerosis) are addressed. Get help right away if:  You have sudden weakness or numbness of the  face, arm, or leg, especially on one side of the body.  Your face or eyelid droops to one side.  You have sudden confusion.  You have trouble speaking (aphasia) or understanding.  You have sudden trouble seeing in one or both eyes.  You have sudden trouble walking.  You have dizziness.  You have a loss of balance or coordination.  You have a sudden, severe headache with no known cause.  You have new chest pain or an irregular heartbeat. Any of these symptoms may represent a serious problem that is an emergency. Do not wait to see if the symptoms will go away. Get medical help at once. Call your local emergency services (911 in [place] Do not drive yourself to the hospital. This information is not intended to replace advice given to you by your health care provider. Make sure you discuss any questions you have with your health care provider. Document Released: 03/21/2004 Document Revised: 07/20/2015 Document Reviewed: 08/14/2012 Elsevier Interactive Patient Education  2017 [employer].

## 2016-08-07 ENCOUNTER — Telehealth: Payer: Self-pay | Admitting: Surgery

## 2016-08-07 NOTE — Telephone Encounter (Signed)
S/p R RC AVF 07/25/16  F/u w/ VWB in 4-6 w w/ dupex   As per Dellie Catholic  Sched lab 08/22/16 at 11:30 and MD 08/26/16 at 2:00. Lm on cell# for pt to confirm appt.

## 2016-08-08 ENCOUNTER — Ambulatory Visit (INDEPENDENT_AMBULATORY_CARE_PROVIDER_SITE_OTHER): Payer: Medicare Other | Admitting: *Deleted

## 2016-08-08 DIAGNOSIS — I639 Cerebral infarction, unspecified: Secondary | ICD-10-CM | POA: Diagnosis not present

## 2016-08-09 ENCOUNTER — Encounter (HOSPITAL_COMMUNITY): Payer: Medicare Other

## 2016-08-09 ENCOUNTER — Encounter (HOSPITAL_COMMUNITY)
Admission: RE | Admit: 2016-08-09 | Discharge: 2016-08-09 | Disposition: A | Discharge status: Home / Self Care | Payer: Medicare Other | Source: Ambulatory Visit | Attending: Nephrology | Admitting: Nephrology

## 2016-08-09 DIAGNOSIS — D631 Anemia in chronic kidney disease: Secondary | ICD-10-CM

## 2016-08-09 DIAGNOSIS — N184 Chronic kidney disease, stage 4 (severe): Principal | ICD-10-CM

## 2016-08-09 LAB — POCT HEMOGLOBIN-HEMACUE: Hemoglobin: 9 g/dL — ABNORMAL LOW (ref 13.0–17.0)

## 2016-08-09 MED ORDER — DARBEPOETIN ALFA 100 MCG/0.5ML IJ SOSY
PREFILLED_SYRINGE | INTRAMUSCULAR | Status: AC
  Filled 2016-08-09: qty 0.5

## 2016-08-09 MED ORDER — DARBEPOETIN ALFA 100 MCG/0.5ML IJ SOSY
100.0000 ug | PREFILLED_SYRINGE | INTRAMUSCULAR | Status: DC
  Administered 2016-08-09: 10:00:00 100 ug via SUBCUTANEOUS

## 2016-08-09 NOTE — Progress Notes (Signed)
Carelink Summary Report / Loop Recorder 

## 2016-08-19 LAB — CUP PACEART REMOTE DEVICE CHECK
Date Time Interrogation Session: 20180614220846
MDC IDC PG IMPLANT DT: 20170321

## 2016-08-19 NOTE — Progress Notes (Signed)
Carelink summary report received. Battery status OK. Normal device function. No new symptom episodes, tachy episodes, brady, or pause episodes. No new AF episodes. Monthly summary reports and ROV/PRN 

## 2016-08-21 ENCOUNTER — Other Ambulatory Visit: Payer: Self-pay

## 2016-08-21 DIAGNOSIS — Z4931 Encounter for adequacy testing for hemodialysis: Secondary | ICD-10-CM

## 2016-08-22 ENCOUNTER — Ambulatory Visit (HOSPITAL_COMMUNITY)
Admission: RE | Admit: 2016-08-22 | Discharge: 2016-08-22 | Disposition: A | Discharge status: Home / Self Care | Payer: Medicare Other | Source: Ambulatory Visit | Attending: Vascular Surgery | Admitting: Vascular Surgery

## 2016-08-22 DIAGNOSIS — Z4931 Encounter for adequacy testing for hemodialysis: Secondary | ICD-10-CM | POA: Insufficient documentation

## 2016-08-23 ENCOUNTER — Encounter (HOSPITAL_COMMUNITY)
Admission: RE | Admit: 2016-08-23 | Discharge: 2016-08-23 | Disposition: A | Discharge status: Home / Self Care | Payer: Medicare Other | Source: Ambulatory Visit | Attending: Nephrology | Admitting: Nephrology

## 2016-08-23 DIAGNOSIS — N184 Chronic kidney disease, stage 4 (severe): Principal | ICD-10-CM

## 2016-08-23 DIAGNOSIS — D631 Anemia in chronic kidney disease: Secondary | ICD-10-CM

## 2016-08-23 LAB — POCT HEMOGLOBIN-HEMACUE: HEMOGLOBIN: 9.1 g/dL — AB (ref 13.0–17.0)

## 2016-08-23 MED ORDER — DARBEPOETIN ALFA 100 MCG/0.5ML IJ SOSY
PREFILLED_SYRINGE | INTRAMUSCULAR | Status: AC
  Filled 2016-08-23: qty 0.5

## 2016-08-23 MED ORDER — DARBEPOETIN ALFA 100 MCG/0.5ML IJ SOSY
100.0000 ug | PREFILLED_SYRINGE | INTRAMUSCULAR | Status: DC
  Administered 2016-08-23: 09:00:00 100 ug via SUBCUTANEOUS

## 2016-08-26 ENCOUNTER — Encounter: Payer: Medicare Other | Admitting: Surgery

## 2016-09-04 ENCOUNTER — Encounter: Payer: Self-pay | Admitting: Surgery

## 2016-09-05 ENCOUNTER — Telehealth: Payer: Self-pay | Admitting: Cardiology

## 2016-09-05 NOTE — Telephone Encounter (Signed)
Spoke w/ pt and requested that he send a manual transmission b/c his home monitor has not updated in at least 14 days. Pt will not be home until Sunday and will send remote transmission then.

## 2016-09-09 ENCOUNTER — Encounter (HOSPITAL_COMMUNITY): Payer: Medicare Other

## 2016-09-09 ENCOUNTER — Ambulatory Visit (INDEPENDENT_AMBULATORY_CARE_PROVIDER_SITE_OTHER): Payer: Medicare Other | Admitting: *Deleted

## 2016-09-09 DIAGNOSIS — I639 Cerebral infarction, unspecified: Secondary | ICD-10-CM

## 2016-09-09 NOTE — Progress Notes (Signed)
Carelink Summary Report / Loop Recorder 

## 2016-09-11 ENCOUNTER — Encounter (HOSPITAL_COMMUNITY)
Admission: RE | Admit: 2016-09-11 | Discharge: 2016-09-11 | Disposition: A | Discharge status: Home / Self Care | Payer: Medicare Other | Source: Ambulatory Visit | Attending: Nephrology | Admitting: Nephrology

## 2016-09-11 DIAGNOSIS — D638 Anemia in other chronic diseases classified elsewhere: Secondary | ICD-10-CM | POA: Diagnosis not present

## 2016-09-11 DIAGNOSIS — D631 Anemia in chronic kidney disease: Secondary | ICD-10-CM

## 2016-09-11 DIAGNOSIS — N184 Chronic kidney disease, stage 4 (severe): Secondary | ICD-10-CM | POA: Insufficient documentation

## 2016-09-11 LAB — FERRITIN: FERRITIN: 43 ng/mL (ref 24–336)

## 2016-09-11 LAB — POCT HEMOGLOBIN-HEMACUE: HEMOGLOBIN: 9.5 g/dL — AB (ref 13.0–17.0)

## 2016-09-11 LAB — IRON AND TIBC
Iron: 19 ug/dL — ABNORMAL LOW (ref 45–182)
Saturation Ratios: 6 % — ABNORMAL LOW (ref 17.9–39.5)
TIBC: 328 ug/dL (ref 250–450)
UIBC: 309 ug/dL

## 2016-09-11 MED ORDER — DARBEPOETIN ALFA 100 MCG/0.5ML IJ SOSY
PREFILLED_SYRINGE | INTRAMUSCULAR | Status: AC
  Filled 2016-09-11: qty 0.5

## 2016-09-11 MED ORDER — DARBEPOETIN ALFA 100 MCG/0.5ML IJ SOSY
100.0000 ug | PREFILLED_SYRINGE | INTRAMUSCULAR | Status: DC
  Administered 2016-09-11: 09:00:00 100 ug via SUBCUTANEOUS

## 2016-09-16 ENCOUNTER — Ambulatory Visit (INDEPENDENT_AMBULATORY_CARE_PROVIDER_SITE_OTHER): Payer: Self-pay | Admitting: Surgery

## 2016-09-16 ENCOUNTER — Encounter: Payer: Self-pay | Admitting: Surgery

## 2016-09-16 VITALS — BP 151/66 | HR 61 | Temp 97.8°F | Resp 20 | Ht 68.0 in | Wt 183.0 lb

## 2016-09-16 DIAGNOSIS — N184 Chronic kidney disease, stage 4 (severe): Secondary | ICD-10-CM

## 2016-09-16 NOTE — Progress Notes (Signed)
Patient name: Robert [name] MRN: [id number] DOB: 1936/01/03 Sex: male  REASON FOR VISIT:     post op   HISTORY OF PRESENT ILLNESS:   Robert [name] is a 81 y.o. male returns today for follow-up.  He is status post right radiocephalic fistula on 54/98/2641.  He reports that occasionally his right hand will go numb, or go to sleep.  He is not doing any exercises.  He does complain of some left facial numbness.  The strength in his left hand is getting better.  CURRENT MEDICATIONS:    Current Outpatient Prescriptions  Medication Sig Dispense Refill  . albuterol (PROVENTIL HFA;VENTOLIN HFA) 108 (90 Base) MCG/ACT inhaler Inhale 2 puffs into the lungs every 6 (six) hours as needed for wheezing or shortness of breath.    [name] aspirin 325 MG EC tablet Take 325 mg by mouth daily.    . clopidogrel (PLAVIX) 75 MG tablet Take 1 tablet (75 mg total) by mouth daily. 30 tablet 1  . ezetimibe (ZETIA) 10 MG tablet Take 1 tablet (10 mg total) by mouth daily. (Patient taking differently: Take 10 mg by mouth every evening. ) 30 tablet 1  . fexofenadine (ALLEGRA) 180 MG tablet Take 180 mg by mouth daily.    . furosemide (LASIX) 40 MG tablet Take 40 mg by mouth 2 (two) times daily.    [name] HUMALOG 100 UNIT/ML injection 30 units basal over 24 hours and bolus is up to 36 units per 24 hours in VGO 30  5  . hydrALAZINE (APRESOLINE) 100 MG tablet Take 100 mg by mouth 3 (three) times daily.     . Insulin Disposable Pump (V-GO 30) KIT as directed.     [name] levothyroxine (SYNTHROID, LEVOTHROID) 50 MCG tablet Take 50 mcg by mouth daily before breakfast.     . metoprolol succinate (TOPROL-XL) 50 MG 24 hr tablet Take 50 mg by mouth 2 (two) times daily.     . Multiple Vitamins-Minerals (MULTIVITAMIN WITH MINERALS) tablet Take 1 tablet by mouth daily.    [name] omeprazole (PRILOSEC) 20 MG capsule Take 20 mg by mouth every evening.     [name] oxyCODONE-acetaminophen (PERCOCET/ROXICET) 5-325 MG tablet  Take 1-2 tablets by mouth every 6 (six) hours as needed for moderate pain. 6 tablet 0  . sertraline (ZOLOFT) 25 MG tablet Take 25 mg by mouth daily.    . simvastatin (ZOCOR) 40 MG tablet Take 1 tablet (40 mg total) by mouth daily at 6 PM. 30 tablet 0   No current facility-administered medications for this visit.     REVIEW OF SYSTEMS:   '[X]'  denotes positive finding, '[ ]'  denotes negative finding Cardiac  Comments:  Chest pain or chest pressure:    Shortness of breath upon exertion:    Short of breath when lying flat:    Irregular heart rhythm:    Constitutional    Fever or chills:      PHYSICAL EXAM:   Vitals:   09/16/16 0836  BP: (!) 151/66  Pulse: 61  Temp: 97.8 F (36.6 C)  SpO2: 97%  Weight: 183 lb (83 kg)    GENERAL: The patient is a well-nourished male, in no acute distress. The vital signs are documented above. CARDIOVASCULAR: There is a regular rate and rhythm. PULMONARY: Non-labored respirations Excellent thrill within fistula.  I used a sono site to evaluate the vein.  It appears to be of equal caliber throughout measuring 0.48 cm.  STUDIES:   Please compare to my  sono site evaluation.  Ultrasound identified elevated velocities near the anastomosis and diameters around 4 cm.   MEDICAL ISSUES:   Maturing right radiocephalic fistula.  I have encouraged patient to continue with exercises to his right hand.  I feel that it has increased in size from the postop ultrasound in late June until my sono site evaluation today.  I'm going to have him continue with surveillance.  He will follow-up in 6 weeks.  I suspect at that time it will be usable.  There is a slight concern that it may be too deep.  This will all be evaluated in 6 weeks.  Hopefully it will be ready for use  [name], MD [facility] [phone] Pager [phone]

## 2016-09-17 ENCOUNTER — Other Ambulatory Visit (HOSPITAL_COMMUNITY): Payer: Self-pay | Admitting: *Deleted

## 2016-09-18 ENCOUNTER — Encounter (HOSPITAL_COMMUNITY)
Admission: RE | Admit: 2016-09-18 | Discharge: 2016-09-18 | Disposition: A | Discharge status: Home / Self Care | Payer: Medicare Other | Source: Ambulatory Visit | Attending: Nephrology | Admitting: Nephrology

## 2016-09-18 DIAGNOSIS — N184 Chronic kidney disease, stage 4 (severe): Secondary | ICD-10-CM | POA: Diagnosis not present

## 2016-09-18 MED ORDER — SODIUM CHLORIDE 0.9 % IV SOLN
510.0000 mg | INTRAVENOUS | Status: DC
  Administered 2016-09-18: 12:00:00 510 mg via INTRAVENOUS
  Filled 2016-09-18: qty 17

## 2016-09-19 NOTE — Addendum Note (Signed)
Addended by: Lianne Cure A on: 09/19/2016 12:11 PM   Modules accepted: Orders

## 2016-09-23 LAB — CUP PACEART REMOTE DEVICE CHECK
MDC IDC PG IMPLANT DT: 20170321
MDC IDC SESS DTM: 20180714224720

## 2016-09-23 NOTE — Progress Notes (Signed)
Carelink summary report received. Battery status OK. Normal device function. No new symptom episodes, tachy episodes, brady, or pause episodes. No new AF episodes. Monthly summary reports and ROV/PRN 

## 2016-09-25 ENCOUNTER — Encounter (HOSPITAL_COMMUNITY)
Admission: RE | Admit: 2016-09-25 | Discharge: 2016-09-25 | Disposition: A | Discharge status: Home / Self Care | Payer: Medicare Other | Source: Ambulatory Visit | Attending: Nephrology | Admitting: Nephrology

## 2016-09-25 DIAGNOSIS — N184 Chronic kidney disease, stage 4 (severe): Secondary | ICD-10-CM | POA: Diagnosis present

## 2016-09-25 DIAGNOSIS — D638 Anemia in other chronic diseases classified elsewhere: Secondary | ICD-10-CM | POA: Diagnosis not present

## 2016-09-25 DIAGNOSIS — D631 Anemia in chronic kidney disease: Secondary | ICD-10-CM

## 2016-09-25 LAB — POCT HEMOGLOBIN-HEMACUE: HEMOGLOBIN: 9.1 g/dL — AB (ref 13.0–17.0)

## 2016-09-25 MED ORDER — DARBEPOETIN ALFA 100 MCG/0.5ML IJ SOSY
100.0000 ug | PREFILLED_SYRINGE | INTRAMUSCULAR | Status: DC
  Administered 2016-09-25: 100 ug via SUBCUTANEOUS

## 2016-09-25 MED ORDER — DARBEPOETIN ALFA 100 MCG/0.5ML IJ SOSY
PREFILLED_SYRINGE | INTRAMUSCULAR | Status: AC
  Administered 2016-09-25: 100 ug via SUBCUTANEOUS
  Filled 2016-09-25: qty 0.5

## 2016-09-25 MED ORDER — SODIUM CHLORIDE 0.9 % IV SOLN
510.0000 mg | INTRAVENOUS | Status: DC
  Administered 2016-09-25: 510 mg via INTRAVENOUS
  Filled 2016-09-25: qty 17

## 2016-10-07 ENCOUNTER — Ambulatory Visit (INDEPENDENT_AMBULATORY_CARE_PROVIDER_SITE_OTHER): Payer: Medicare Other | Admitting: *Deleted

## 2016-10-07 DIAGNOSIS — I639 Cerebral infarction, unspecified: Secondary | ICD-10-CM

## 2016-10-09 ENCOUNTER — Encounter (HOSPITAL_COMMUNITY)
Admission: RE | Admit: 2016-10-09 | Discharge: 2016-10-09 | Disposition: A | Discharge status: Home / Self Care | Payer: Medicare Other | Source: Ambulatory Visit | Attending: Nephrology | Admitting: Nephrology

## 2016-10-09 DIAGNOSIS — D631 Anemia in chronic kidney disease: Secondary | ICD-10-CM

## 2016-10-09 DIAGNOSIS — N184 Chronic kidney disease, stage 4 (severe): Secondary | ICD-10-CM | POA: Diagnosis not present

## 2016-10-09 LAB — IRON AND TIBC
IRON: 41 ug/dL — AB (ref 45–182)
Saturation Ratios: 15 % — ABNORMAL LOW (ref 17.9–39.5)
TIBC: 265 ug/dL (ref 250–450)
UIBC: 224 ug/dL

## 2016-10-09 LAB — FERRITIN: Ferritin: 271 ng/mL (ref 24–336)

## 2016-10-09 LAB — POCT HEMOGLOBIN-HEMACUE: Hemoglobin: 11.2 g/dL — ABNORMAL LOW (ref 13.0–17.0)

## 2016-10-09 MED ORDER — DARBEPOETIN ALFA 100 MCG/0.5ML IJ SOSY
PREFILLED_SYRINGE | INTRAMUSCULAR | Status: AC
  Administered 2016-10-09: 100 ug via SUBCUTANEOUS
  Filled 2016-10-09: qty 0.5

## 2016-10-09 MED ORDER — DARBEPOETIN ALFA 100 MCG/0.5ML IJ SOSY
100.0000 ug | PREFILLED_SYRINGE | INTRAMUSCULAR | Status: DC
  Administered 2016-10-09: 100 ug via SUBCUTANEOUS

## 2016-10-12 LAB — CUP PACEART REMOTE DEVICE CHECK
Date Time Interrogation Session: 20180813233851
MDC IDC PG IMPLANT DT: 20170321

## 2016-10-12 NOTE — Progress Notes (Signed)
Carelink summary report received. Battery status OK. Normal device function. No new symptom episodes, tachy episodes, brady, or pause episodes. No new AF episodes. Monthly summary reports and ROV/PRN 

## 2016-10-21 NOTE — Progress Notes (Signed)
Loop Recorder Summary Report 

## 2016-10-22 ENCOUNTER — Other Ambulatory Visit (HOSPITAL_COMMUNITY): Payer: Self-pay | Admitting: *Deleted

## 2016-10-23 ENCOUNTER — Encounter: Payer: Self-pay | Admitting: Surgery

## 2016-10-23 ENCOUNTER — Encounter (HOSPITAL_COMMUNITY)
Admission: RE | Admit: 2016-10-23 | Discharge: 2016-10-23 | Disposition: A | Discharge status: Home / Self Care | Payer: Medicare Other | Source: Ambulatory Visit | Attending: Nephrology | Admitting: Nephrology

## 2016-10-23 DIAGNOSIS — D631 Anemia in chronic kidney disease: Secondary | ICD-10-CM

## 2016-10-23 DIAGNOSIS — N184 Chronic kidney disease, stage 4 (severe): Secondary | ICD-10-CM | POA: Diagnosis not present

## 2016-10-23 LAB — POCT HEMOGLOBIN-HEMACUE: Hemoglobin: 11.4 g/dL — ABNORMAL LOW (ref 13.0–17.0)

## 2016-10-23 MED ORDER — DARBEPOETIN ALFA 100 MCG/0.5ML IJ SOSY
PREFILLED_SYRINGE | INTRAMUSCULAR | Status: AC
  Administered 2016-10-23: 100 ug via SUBCUTANEOUS
  Filled 2016-10-23: qty 0.5

## 2016-10-23 MED ORDER — DARBEPOETIN ALFA 100 MCG/0.5ML IJ SOSY
100.0000 ug | PREFILLED_SYRINGE | INTRAMUSCULAR | Status: DC
  Administered 2016-10-23: 100 ug via SUBCUTANEOUS

## 2016-10-23 MED ORDER — SODIUM CHLORIDE 0.9 % IV SOLN
510.0000 mg | Freq: Once | INTRAVENOUS | Status: AC
  Administered 2016-10-23: 510 mg via INTRAVENOUS
  Filled 2016-10-23: qty 17

## 2016-10-30 ENCOUNTER — Telehealth: Payer: Self-pay | Admitting: *Deleted

## 2016-10-30 NOTE — Telephone Encounter (Signed)
Spoke with patient regarding sending manual transmission. 3 AF episodes-- 1 available ECGs appears SR with ectopy. Will review once received and will call back if further recommendations are needed.

## 2016-11-04 ENCOUNTER — Encounter: Payer: Self-pay | Admitting: Surgery

## 2016-11-04 ENCOUNTER — Ambulatory Visit (HOSPITAL_COMMUNITY)
Admission: RE | Admit: 2016-11-04 | Discharge: 2016-11-04 | Disposition: A | Discharge status: Home / Self Care | Payer: Medicare Other | Source: Ambulatory Visit | Attending: Surgery | Admitting: Surgery

## 2016-11-04 ENCOUNTER — Ambulatory Visit (INDEPENDENT_AMBULATORY_CARE_PROVIDER_SITE_OTHER): Payer: Medicare Other | Admitting: Surgery

## 2016-11-04 VITALS — BP 173/71 | HR 59 | Temp 97.3°F | Resp 22 | Ht 68.0 in | Wt 187.0 lb

## 2016-11-04 DIAGNOSIS — N184 Chronic kidney disease, stage 4 (severe): Secondary | ICD-10-CM | POA: Diagnosis not present

## 2016-11-04 DIAGNOSIS — Z01818 Encounter for other preprocedural examination: Secondary | ICD-10-CM | POA: Insufficient documentation

## 2016-11-04 DIAGNOSIS — N185 Chronic kidney disease, stage 5: Secondary | ICD-10-CM | POA: Diagnosis not present

## 2016-11-04 NOTE — Telephone Encounter (Signed)
Spoke with patient, requested manual transmission.  Patient states he is driving right now but will plan to call back tomorrow for assistance sending a manual transmission.    Spoke with patient, advised that after further review, transmission was received on 10/30/16.  ECGs show SR w/PACs, not AF.  ECGs printed and placed in Dr. Otilio Connors folder for review.

## 2016-11-04 NOTE — Progress Notes (Signed)
Patient name: Robert [name] MRN: [id number] DOB: [birth year] Sex: male  REASON FOR VISIT:     post op  HISTORY OF PRESENT ILLNESS:   Robert [name] is a 81 y.o. male who returns today for follow up.  He is status post right radiocephalic fistula creation on 07/25/2016.  Abdomen following him for maturation for several months.  He is not yet on dialysis.  CURRENT MEDICATIONS:    Current Outpatient Prescriptions  Medication Sig Dispense Refill  . albuterol (PROVENTIL HFA;VENTOLIN HFA) 108 (90 Base) MCG/ACT inhaler Inhale 2 puffs into the lungs every 6 (six) hours as needed for wheezing or shortness of breath.    [name] aspirin 325 MG EC tablet Take 325 mg by mouth daily.    . clopidogrel (PLAVIX) 75 MG tablet Take 1 tablet (75 mg total) by mouth daily. 30 tablet 1  . ezetimibe (ZETIA) 10 MG tablet Take 1 tablet (10 mg total) by mouth daily. (Patient taking differently: Take 10 mg by mouth every evening. ) 30 tablet 1  . fexofenadine (ALLEGRA) 180 MG tablet Take 180 mg by mouth daily.    . furosemide (LASIX) 40 MG tablet Take 40 mg by mouth 2 (two) times daily.    [name] HUMALOG 100 UNIT/ML injection 30 units basal over 24 hours and bolus is up to 36 units per 24 hours in VGO 30  5  . hydrALAZINE (APRESOLINE) 100 MG tablet Take 100 mg by mouth 3 (three) times daily.     . Insulin Disposable Pump (V-GO 30) KIT as directed.     [name] levothyroxine (SYNTHROID, LEVOTHROID) 50 MCG tablet Take 50 mcg by mouth daily before breakfast.     . metoprolol succinate (TOPROL-XL) 50 MG 24 hr tablet Take 50 mg by mouth 2 (two) times daily.     . Multiple Vitamins-Minerals (MULTIVITAMIN WITH MINERALS) tablet Take 1 tablet by mouth daily.    [name] omeprazole (PRILOSEC) 20 MG capsule Take 20 mg by mouth every evening.     [name] oxyCODONE-acetaminophen (PERCOCET/ROXICET) 5-325 MG tablet Take 1-2 tablets by mouth every 6 (six) hours as needed for moderate pain. 6 tablet 0  . sertraline  (ZOLOFT) 25 MG tablet Take 25 mg by mouth daily.    . simvastatin (ZOCOR) 40 MG tablet Take 1 tablet (40 mg total) by mouth daily at 6 PM. 30 tablet 0   No current facility-administered medications for this visit.     REVIEW OF SYSTEMS:   '[X]'  denotes positive finding, '[ ]'  denotes negative finding Cardiac  Comments:  Chest pain or chest pressure:    Shortness of breath upon exertion:    Short of breath when lying flat:    Irregular heart rhythm:    Constitutional    Fever or chills:      PHYSICAL EXAM:   Vitals:   11/04/16 0947  BP: (!) 173/71  Pulse: (!) 59  Resp: (!) 22  Temp: (!) 97.3 F (36.3 C)  TempSrc: Oral  SpO2: 99%  Weight: 187 lb (84.8 kg)  Height: '5\' 8"'  (1.727 m)    GENERAL: The patient is a well-nourished male, in no acute distress. The vital signs are documented above. CARDIOVASCULAR: There is a regular rate and rhythm. PULMONARY: Non-labored respirations There is a good thrill within the fistula near the wrist.  About halfway up the forearm it becomes difficult to feel  STUDIES:   Duplex ultrasound today shows that the vein now measures 0.5 cm.  There are 2 branches.  When I used a sono site, I feel that the vein is too deep in the upper portion of the forearm.  There are also 2 visible branches.   MEDICAL ISSUES:   Non-maturing fistula: I have recommended proceeding with fistula elevation and branch ligation.  I feel that I can do this through an incision in the upper half of the forearm.  There are 2 branches that I would ligate and addition to elevation.  This is been scheduled for Thursday, September 27.  I'll stop his Plavix prior to the procedure.  [name], MD [facility] [phone] Pager [phone]

## 2016-11-06 ENCOUNTER — Ambulatory Visit (INDEPENDENT_AMBULATORY_CARE_PROVIDER_SITE_OTHER): Payer: Medicare Other | Admitting: *Deleted

## 2016-11-06 ENCOUNTER — Encounter (HOSPITAL_COMMUNITY)
Admission: RE | Admit: 2016-11-06 | Discharge: 2016-11-06 | Disposition: A | Discharge status: Home / Self Care | Payer: Medicare Other | Source: Ambulatory Visit | Attending: Nephrology | Admitting: Nephrology

## 2016-11-06 DIAGNOSIS — N184 Chronic kidney disease, stage 4 (severe): Secondary | ICD-10-CM | POA: Insufficient documentation

## 2016-11-06 DIAGNOSIS — D631 Anemia in chronic kidney disease: Secondary | ICD-10-CM

## 2016-11-06 DIAGNOSIS — I639 Cerebral infarction, unspecified: Secondary | ICD-10-CM | POA: Diagnosis not present

## 2016-11-06 DIAGNOSIS — D638 Anemia in other chronic diseases classified elsewhere: Secondary | ICD-10-CM | POA: Insufficient documentation

## 2016-11-06 LAB — IRON AND TIBC
Iron: 34 ug/dL — ABNORMAL LOW (ref 45–182)
SATURATION RATIOS: 15 % — AB (ref 17.9–39.5)
TIBC: 231 ug/dL — AB (ref 250–450)
UIBC: 197 ug/dL

## 2016-11-06 LAB — POCT HEMOGLOBIN-HEMACUE: Hemoglobin: 12 g/dL — ABNORMAL LOW (ref 13.0–17.0)

## 2016-11-06 LAB — FERRITIN: FERRITIN: 297 ng/mL (ref 24–336)

## 2016-11-06 MED ORDER — DARBEPOETIN ALFA 100 MCG/0.5ML IJ SOSY
PREFILLED_SYRINGE | INTRAMUSCULAR | Status: AC
  Filled 2016-11-06: qty 0.5

## 2016-11-06 MED ORDER — DARBEPOETIN ALFA 100 MCG/0.5ML IJ SOSY: 100.0000 ug | PREFILLED_SYRINGE | INTRAMUSCULAR | Status: DC

## 2016-11-07 NOTE — Progress Notes (Signed)
Carelink Summary Report / Loop Recorder 

## 2016-11-08 ENCOUNTER — Other Ambulatory Visit: Payer: Self-pay

## 2016-11-11 LAB — CUP PACEART REMOTE DEVICE CHECK
Date Time Interrogation Session: 20180912234422
Implantable Pulse Generator Implant Date: 20170321

## 2016-11-19 ENCOUNTER — Other Ambulatory Visit: Payer: Self-pay | Admitting: Internal Medicine

## 2016-11-20 ENCOUNTER — Encounter (HOSPITAL_COMMUNITY)
Admission: RE | Admit: 2016-11-20 | Discharge: 2016-11-20 | Disposition: A | Discharge status: Home / Self Care | Payer: Medicare Other | Source: Ambulatory Visit | Attending: Nephrology | Admitting: Nephrology

## 2016-11-20 DIAGNOSIS — N184 Chronic kidney disease, stage 4 (severe): Principal | ICD-10-CM

## 2016-11-20 DIAGNOSIS — D631 Anemia in chronic kidney disease: Secondary | ICD-10-CM

## 2016-11-20 LAB — POCT HEMOGLOBIN-HEMACUE: HEMOGLOBIN: 12.5 g/dL — AB (ref 13.0–17.0)

## 2016-11-20 MED ORDER — DARBEPOETIN ALFA 100 MCG/0.5ML IJ SOSY: 100.0000 ug | PREFILLED_SYRINGE | INTRAMUSCULAR | Status: DC

## 2016-12-04 ENCOUNTER — Encounter (HOSPITAL_COMMUNITY): Payer: Self-pay | Admitting: *Deleted

## 2016-12-04 ENCOUNTER — Encounter (HOSPITAL_COMMUNITY): Payer: Medicare Other

## 2016-12-04 NOTE — Progress Notes (Signed)
Spoke with pt's daughter, Oris Drone for pre-op call. She states pt has hx of MI in 2011 with 3 stents. No recent chest pain or sob. Pt is a type 2 diabetic. Last A1C was 8.3 on 05/21/16. Mickel Baas states she hasn't looked at pt's recent fasting blood sugar numbers, but states they have been running much better since March due to weight loss. Pt is on a VGO pump and is unable to adjust basal rate. Instructed Mickel Baas to have pt check his blood sugar in the AM when he gets up and every 2 hours until he leaves for the hospital. If blood sugar is 70 or below, treat with 1/2 cup of clear juice (apple or cranberry) and recheck blood sugar 15 minutes after drinking juice. If blood sugar continues to be 70 or below, call the Short Stay department and ask to speak to a nurse. Mickel Baas voiced understanding.

## 2016-12-05 ENCOUNTER — Encounter (HOSPITAL_COMMUNITY)
Admission: RE | Disposition: A | Discharge status: Home / Self Care | Payer: Self-pay | Source: Ambulatory Visit | Attending: Surgery

## 2016-12-05 ENCOUNTER — Ambulatory Visit (HOSPITAL_COMMUNITY): Payer: Medicare Other | Admitting: Anesthesiology

## 2016-12-05 ENCOUNTER — Encounter (HOSPITAL_COMMUNITY): Payer: Self-pay | Admitting: Certified Registered Nurse Anesthetist

## 2016-12-05 ENCOUNTER — Ambulatory Visit (HOSPITAL_COMMUNITY)
Admission: RE | Admit: 2016-12-05 | Discharge: 2016-12-05 | Disposition: A | Discharge status: Home / Self Care | Payer: Medicare Other | Source: Ambulatory Visit | Attending: Surgery | Admitting: Surgery

## 2016-12-05 DIAGNOSIS — D631 Anemia in chronic kidney disease: Secondary | ICD-10-CM | POA: Diagnosis not present

## 2016-12-05 DIAGNOSIS — Z8673 Personal history of transient ischemic attack (TIA), and cerebral infarction without residual deficits: Secondary | ICD-10-CM | POA: Diagnosis not present

## 2016-12-05 DIAGNOSIS — Z87891 Personal history of nicotine dependence: Secondary | ICD-10-CM | POA: Insufficient documentation

## 2016-12-05 DIAGNOSIS — I132 Hypertensive heart and chronic kidney disease with heart failure and with stage 5 chronic kidney disease, or end stage renal disease: Secondary | ICD-10-CM | POA: Insufficient documentation

## 2016-12-05 DIAGNOSIS — N186 End stage renal disease: Secondary | ICD-10-CM

## 2016-12-05 DIAGNOSIS — E1051 Type 1 diabetes mellitus with diabetic peripheral angiopathy without gangrene: Secondary | ICD-10-CM | POA: Insufficient documentation

## 2016-12-05 DIAGNOSIS — Y832 Surgical operation with anastomosis, bypass or graft as the cause of abnormal reaction of the patient, or of later complication, without mention of misadventure at the time of the procedure: Secondary | ICD-10-CM | POA: Diagnosis not present

## 2016-12-05 DIAGNOSIS — Z7982 Long term (current) use of aspirin: Secondary | ICD-10-CM | POA: Insufficient documentation

## 2016-12-05 DIAGNOSIS — E1022 Type 1 diabetes mellitus with diabetic chronic kidney disease: Secondary | ICD-10-CM | POA: Insufficient documentation

## 2016-12-05 DIAGNOSIS — K219 Gastro-esophageal reflux disease without esophagitis: Secondary | ICD-10-CM | POA: Insufficient documentation

## 2016-12-05 DIAGNOSIS — T82898A Other specified complication of vascular prosthetic devices, implants and grafts, initial encounter: Secondary | ICD-10-CM | POA: Insufficient documentation

## 2016-12-05 DIAGNOSIS — I509 Heart failure, unspecified: Secondary | ICD-10-CM | POA: Insufficient documentation

## 2016-12-05 HISTORY — DX: Depression, unspecified: F32.A

## 2016-12-05 HISTORY — DX: Major depressive disorder, single episode, unspecified: F32.9

## 2016-12-05 HISTORY — PX: FISTULA SUPERFICIALIZATION: SHX6341

## 2016-12-05 HISTORY — DX: Anemia, unspecified: D64.9

## 2016-12-05 LAB — POCT I-STAT 4, (NA,K, GLUC, HGB,HCT)
GLUCOSE: 237 mg/dL — AB (ref 65–99)
HEMATOCRIT: 36 % — AB (ref 39.0–52.0)
Hemoglobin: 12.2 g/dL — ABNORMAL LOW (ref 13.0–17.0)
POTASSIUM: 5.3 mmol/L — AB (ref 3.5–5.1)
Sodium: 140 mmol/L (ref 135–145)

## 2016-12-05 LAB — GLUCOSE, CAPILLARY: GLUCOSE-CAPILLARY: 96 mg/dL (ref 65–99)

## 2016-12-05 SURGERY — FISTULA SUPERFICIALIZATION
Anesthesia: Monitor Anesthesia Care | Site: Arm Lower | Laterality: Right

## 2016-12-05 MED ORDER — FENTANYL CITRATE (PF) 100 MCG/2ML IJ SOLN: 25.0000 ug | INTRAMUSCULAR | Status: DC | PRN

## 2016-12-05 MED ORDER — DEXTROSE 5 % IV SOLN
1.5000 g | INTRAVENOUS | Status: AC
  Administered 2016-12-05: 1.5 g via INTRAVENOUS
  Filled 2016-12-05: qty 1.5

## 2016-12-05 MED ORDER — ONDANSETRON HCL 4 MG/2ML IJ SOLN
INTRAMUSCULAR | Status: DC | PRN
  Administered 2016-12-05: 4 mg via INTRAVENOUS

## 2016-12-05 MED ORDER — FENTANYL CITRATE (PF) 100 MCG/2ML IJ SOLN
INTRAMUSCULAR | Status: DC | PRN
  Administered 2016-12-05: 50 ug via INTRAVENOUS

## 2016-12-05 MED ORDER — CHLORHEXIDINE GLUCONATE CLOTH 2 % EX PADS: 6.0000 | MEDICATED_PAD | Freq: Once | CUTANEOUS | Status: DC

## 2016-12-05 MED ORDER — OXYCODONE-ACETAMINOPHEN 5-325 MG PO TABS: 1.0000 | ORAL_TABLET | Freq: Four times a day (QID) | ORAL | 0 refills | Status: DC | PRN

## 2016-12-05 MED ORDER — 0.9 % SODIUM CHLORIDE (POUR BTL) OPTIME
TOPICAL | Status: DC | PRN
  Administered 2016-12-05: 1000 mL

## 2016-12-05 MED ORDER — LIDOCAINE-EPINEPHRINE (PF) 1 %-1:200000 IJ SOLN
INTRAMUSCULAR | Status: AC
  Filled 2016-12-05: qty 30

## 2016-12-05 MED ORDER — PROPOFOL 10 MG/ML IV BOLUS
INTRAVENOUS | Status: DC | PRN
  Administered 2016-12-05: 20 mg via INTRAVENOUS

## 2016-12-05 MED ORDER — SODIUM CHLORIDE 0.9 % IV SOLN
INTRAVENOUS | Status: DC | PRN
  Administered 2016-12-05: 12:00:00

## 2016-12-05 MED ORDER — PROPOFOL 500 MG/50ML IV EMUL
INTRAVENOUS | Status: DC | PRN
  Administered 2016-12-05: 100 ug/kg/min via INTRAVENOUS

## 2016-12-05 MED ORDER — SODIUM CHLORIDE 0.9 % IV SOLN
INTRAVENOUS | Status: DC
  Administered 2016-12-05 (×2): via INTRAVENOUS

## 2016-12-05 MED ORDER — FENTANYL CITRATE (PF) 250 MCG/5ML IJ SOLN
INTRAMUSCULAR | Status: AC
  Filled 2016-12-05: qty 5

## 2016-12-05 MED ORDER — LIDOCAINE-EPINEPHRINE (PF) 1 %-1:200000 IJ SOLN
INTRAMUSCULAR | Status: DC | PRN
  Administered 2016-12-05: 6 mL

## 2016-12-05 MED ORDER — PHENYLEPHRINE HCL 10 MG/ML IJ SOLN
INTRAMUSCULAR | Status: DC | PRN
  Administered 2016-12-05: 25 ug/min via INTRAVENOUS

## 2016-12-05 SURGICAL SUPPLY — 30 items
ARMBAND PINK RESTRICT EXTREMIT (MISCELLANEOUS) ×3 IMPLANT
CANISTER SUCT 3000ML PPV (MISCELLANEOUS) ×3 IMPLANT
CLIP VESOCCLUDE MED 6/CT (CLIP) ×3 IMPLANT
CLIP VESOCCLUDE SM WIDE 6/CT (CLIP) ×3 IMPLANT
COVER PROBE W GEL 5X96 (DRAPES) ×3 IMPLANT
DERMABOND ADVANCED (GAUZE/BANDAGES/DRESSINGS) ×2
DERMABOND ADVANCED .7 DNX12 (GAUZE/BANDAGES/DRESSINGS) ×1 IMPLANT
ELECT REM PT RETURN 9FT ADLT (ELECTROSURGICAL) ×3
ELECTRODE REM PT RTRN 9FT ADLT (ELECTROSURGICAL) ×1 IMPLANT
GLOVE BIOGEL PI IND STRL 7.5 (GLOVE) ×1 IMPLANT
GLOVE BIOGEL PI INDICATOR 7.5 (GLOVE) ×2
GLOVE SURG SS PI 7.5 STRL IVOR (GLOVE) ×3 IMPLANT
GOWN STRL REUS W/ TWL LRG LVL3 (GOWN DISPOSABLE) ×2 IMPLANT
GOWN STRL REUS W/ TWL XL LVL3 (GOWN DISPOSABLE) ×2 IMPLANT
GOWN STRL REUS W/TWL LRG LVL3 (GOWN DISPOSABLE) ×4
GOWN STRL REUS W/TWL XL LVL3 (GOWN DISPOSABLE) ×4
HEMOSTAT SNOW SURGICEL 2X4 (HEMOSTASIS) IMPLANT
KIT BASIN OR (CUSTOM PROCEDURE TRAY) ×3 IMPLANT
KIT ROOM TURNOVER OR (KITS) ×3 IMPLANT
LOOP VESSEL MAXI BLUE (MISCELLANEOUS) ×3 IMPLANT
NS IRRIG 1000ML POUR BTL (IV SOLUTION) ×3 IMPLANT
PACK CV ACCESS (CUSTOM PROCEDURE TRAY) ×3 IMPLANT
PAD ARMBOARD 7.5X6 YLW CONV (MISCELLANEOUS) ×6 IMPLANT
SUT PROLENE 6 0 CC (SUTURE) ×3 IMPLANT
SUT VIC AB 3-0 SH 27 (SUTURE) ×8
SUT VIC AB 3-0 SH 27X BRD (SUTURE) ×4 IMPLANT
SUT VIC AB 4-0 PS2 27 (SUTURE) ×3 IMPLANT
SUT VICRYL 4-0 PS2 18IN ABS (SUTURE) ×3 IMPLANT
UNDERPAD 30X30 (UNDERPADS AND DIAPERS) ×3 IMPLANT
WATER STERILE IRR 1000ML POUR (IV SOLUTION) ×3 IMPLANT

## 2016-12-05 NOTE — H&P (Signed)
Patient name: Robert [name]      MRN: [id number]        DOB: March 19, 1935          Sex: male  REASON FOR VISIT:     post op  HISTORY OF PRESENT ILLNESS:   Robert [name] is a 81 y.o. male who returns today for follow up.  He is status post right radiocephalic fistula creation on 07/25/2016.  I have been following him for maturation for several months.  He is not yet on dialysis.  CURRENT MEDICATIONS:          Current Outpatient Prescriptions  Medication Sig Dispense Refill  . albuterol (PROVENTIL HFA;VENTOLIN HFA) 108 (90 Base) MCG/ACT inhaler Inhale 2 puffs into the lungs every 6 (six) hours as needed for wheezing or shortness of breath.    [name] aspirin 325 MG EC tablet Take 325 mg by mouth daily.    . clopidogrel (PLAVIX) 75 MG tablet Take 1 tablet (75 mg total) by mouth daily. 30 tablet 1  . ezetimibe (ZETIA) 10 MG tablet Take 1 tablet (10 mg total) by mouth daily. (Patient taking differently: Take 10 mg by mouth every evening. ) 30 tablet 1  . fexofenadine (ALLEGRA) 180 MG tablet Take 180 mg by mouth daily.    . furosemide (LASIX) 40 MG tablet Take 40 mg by mouth 2 (two) times daily.    [name] HUMALOG 100 UNIT/ML injection 30 units basal over 24 hours and bolus is up to 36 units per 24 hours in VGO 30  5  . hydrALAZINE (APRESOLINE) 100 MG tablet Take 100 mg by mouth 3 (three) times daily.     . Insulin Disposable Pump (V-GO 30) KIT as directed.     [name] levothyroxine (SYNTHROID, LEVOTHROID) 50 MCG tablet Take 50 mcg by mouth daily before breakfast.     . metoprolol succinate (TOPROL-XL) 50 MG 24 hr tablet Take 50 mg by mouth 2 (two) times daily.     . Multiple Vitamins-Minerals (MULTIVITAMIN WITH MINERALS) tablet Take 1 tablet by mouth daily.    [name] omeprazole (PRILOSEC) 20 MG capsule Take 20 mg by mouth every evening.     [name] oxyCODONE-acetaminophen (PERCOCET/ROXICET) 5-325 MG tablet Take 1-2 tablets by mouth every 6 (six) hours as needed for moderate pain.  6 tablet 0  . sertraline (ZOLOFT) 25 MG tablet Take 25 mg by mouth daily.    . simvastatin (ZOCOR) 40 MG tablet Take 1 tablet (40 mg total) by mouth daily at 6 PM. 30 tablet 0   No current facility-administered medications for this visit.     REVIEW OF SYSTEMS:   '[X]'  denotes positive finding, '[ ]'  denotes negative finding Cardiac  Comments:  Chest pain or chest pressure:    Shortness of breath upon exertion:    Short of breath when lying flat:    Irregular heart rhythm:    Constitutional    Fever or chills:      PHYSICAL EXAM:      Vitals:   11/04/16 0947  BP: (!) 173/71  Pulse: (!) 59  Resp: (!) 22  Temp: (!) 97.3 F (36.3 C)  TempSrc: Oral  SpO2: 99%  Weight: 187 lb (84.8 kg)  Height: '5\' 8"'  (1.727 m)    GENERAL: The patient is a well-nourished male, in no acute distress. The vital signs are documented above. CARDIOVASCULAR: There is a regular rate and rhythm. PULMONARY: Non-labored respirations There is a good thrill within the fistula near the  wrist.  About halfway up the forearm it becomes difficult to feel  STUDIES:   Duplex ultrasound today shows that the vein now measures 0.5 cm.  There are 2 branches.  When I used a sono site, I feel that the vein is too deep in the upper portion of the forearm.  There are also 2 visible branches.   MEDICAL ISSUES:   Non-maturing fistula: I have recommended proceeding with fistula elevation and branch ligation.  I feel that I can do this through an incision in the upper half of the forearm.  There are 2 branches that I would ligate and addition to elevation.  This is been scheduled for Thursday, September 27.  I'll stop his Plavix prior to the procedure.  [name], MD [facility] [phone] Pager [phone]

## 2016-12-05 NOTE — Anesthesia Preprocedure Evaluation (Addendum)
Anesthesia Evaluation  Patient identified by MRN, date of birth, ID band Patient awake    Reviewed: Allergy & Precautions, H&P , NPO status , Patient's Chart, lab work & pertinent test results, reviewed documented beta blocker date and time   Airway Mallampati: II  TM Distance: >3 FB Neck ROM: Full    Dental no notable dental hx. (+) Teeth Intact, Dental Advisory Given   Pulmonary neg pulmonary ROS, former smoker,    Pulmonary exam normal breath sounds clear to auscultation       Cardiovascular hypertension, Pt. on medications and Pt. on home beta blockers + Peripheral Vascular Disease and +CHF   Rhythm:Regular Rate:Normal     Neuro/Psych Depression CVA, No Residual Symptoms    GI/Hepatic Neg liver ROS, GERD  Medicated and Controlled,  Endo/Other  diabetes, Type 1, Insulin Dependent  Renal/GU ESRF and DialysisRenal disease  negative genitourinary   Musculoskeletal   Abdominal   Peds  Hematology negative hematology ROS (+) anemia ,   Anesthesia Other Findings   Reproductive/Obstetrics negative OB ROS                            Anesthesia Physical Anesthesia Plan  ASA: III  Anesthesia Plan: MAC   Post-op Pain Management:    Induction: Intravenous  PONV Risk Score and Plan: 2 and Ondansetron, Dexamethasone and Propofol infusion  Airway Management Planned: Simple Face Mask  Additional Equipment:   Intra-op Plan:   Post-operative Plan:   Informed Consent: I have reviewed the patients History and Physical, chart, labs and discussed the procedure including the risks, benefits and alternatives for the proposed anesthesia with the patient or authorized representative who has indicated his/her understanding and acceptance.   Dental advisory given  Plan Discussed with: CRNA  Anesthesia Plan Comments:         Anesthesia Quick Evaluation

## 2016-12-05 NOTE — Anesthesia Postprocedure Evaluation (Signed)
Anesthesia Post Note  Patient: TRON FLYTHE  Procedure(s) Performed: Superficialization and Ligation of Branches OF RIGHT ARM RADIOCEPHALIC FISTULA (Right Arm Lower)     Patient location during evaluation: PACU Anesthesia Type: MAC Level of consciousness: awake and alert Pain management: pain level controlled Vital Signs Assessment: post-procedure vital signs reviewed and stable Respiratory status: spontaneous breathing, nonlabored ventilation and respiratory function stable Cardiovascular status: stable and blood pressure returned to baseline Postop Assessment: no apparent nausea or vomiting Anesthetic complications: no    Last Vitals:  Vitals:   12/05/16 1245 12/05/16 1251  BP: (!) 162/65 (!) 169/65  Pulse: (!) 57 (!) 56  Resp: 17 15  Temp: 36.4 C 36.4 C  SpO2: 97% 98%    Last Pain:  Vitals:   12/05/16 1251  TempSrc:   PainSc: 0-No pain                 Bobbyjo Marulanda,W. EDMOND

## 2016-12-05 NOTE — Transfer of Care (Signed)
Immediate Anesthesia Transfer of Care Note  Patient: Robert [name]  Procedure(s) Performed: Superficialization and Ligation of Branches OF RIGHT ARM RADIOCEPHALIC FISTULA (Right Arm Lower)  Patient Location: PACU  Anesthesia Type:MAC  Level of Consciousness: awake, alert  and oriented  Airway & Oxygen Therapy: Patient Spontanous Breathing  Post-op Assessment: Report given to RN and Post -op Vital signs reviewed and stable  Post vital signs: Reviewed and stable  Last Vitals:  Vitals:   12/05/16 0815 12/05/16 1215  BP: (!) 205/60 (!) (P) 130/52  Pulse: 66   Resp: 18 16  Temp: (!) 36.3 C 36.4 C  SpO2: 100%     Last Pain:  Vitals:   12/05/16 0815  TempSrc: Oral      Patients Stated Pain Goal: 3 (70/01/74 9449)  Complications: No apparent anesthesia complications

## 2016-12-05 NOTE — Anesthesia Procedure Notes (Signed)
Procedure Name: MAC Date/Time: 12/05/2016 11:16 AM Performed by: Candis Shine Pre-anesthesia Checklist: Patient identified, Emergency Drugs available, Suction available, Timeout performed and Patient being monitored Patient Re-evaluated:Patient Re-evaluated prior to induction Oxygen Delivery Method: Simple face mask Dental Injury: Teeth and Oropharynx as per pre-operative assessment

## 2016-12-06 ENCOUNTER — Telehealth: Payer: Self-pay | Admitting: Surgery

## 2016-12-06 ENCOUNTER — Encounter (HOSPITAL_COMMUNITY): Payer: Self-pay | Admitting: Surgery

## 2016-12-06 ENCOUNTER — Ambulatory Visit (INDEPENDENT_AMBULATORY_CARE_PROVIDER_SITE_OTHER): Payer: Medicare Other | Admitting: *Deleted

## 2016-12-06 DIAGNOSIS — I639 Cerebral infarction, unspecified: Secondary | ICD-10-CM

## 2016-12-06 NOTE — Telephone Encounter (Signed)
-----   Message from Mena Goes, RN sent at 12/05/2016  1:53 PM EDT ----- Regarding: 3-4 weeks   ----- Message ----- From: Ulyses Amor, PA-C Sent: 12/05/2016  12:21 PM To: Vvs Charge Pool  S/P superficialization av fistula f/u with Dr. Trula Slade in 3-4 weeks

## 2016-12-06 NOTE — Op Note (Signed)
    Patient name: Robert [name] MRN: [id number] DOB: [birth year] Sex: male  12/05/2016 Pre-operative Diagnosis: Non-maturing right radiocephalic fistula Post-operative diagnosis:  Same Surgeon:  [name] Assistants:  Gerri Lins Procedure:   Elevation and branch ligation of right radiocephalic fistula Anesthesia:  Mac Blood Loss:  See anesthesia record Specimens:  None  Findings:  5 mm vein.  2 large branches (3 mm were ligated  Indications:  The patient underwent a right radius by fistula creation.  This has been felt to be too deep.  He comes in today for elevation.  There also branches identified on ultrasound that we'll be ligated.  Procedure:  The patient was identified in the holding area and taken to [place] 12  The patient was then placed supine on the table. MAC anesthesia was administered.  The patient was prepped and draped in the usual sterile fashion.  A time out was called and antibiotics were administered.  Ultrasound was used to evaluate the fistula.  I made 2 incisions over top of the fistula.  The fistula was fully mobilized from just proximal to the anastomosis up to the antecubital crease.  There were 22-3 mm branches that were ligated.  The vein appeared to be healthy and about 5 mm.  Once the vein was fully mobilized, I closed the subcutaneous tissue posterior to the fistula with interrupted 3-0 Vicryl.  The skin was then closed directly anterior to the fistula with running 4-0 Vicryl.  There was an excellent thrill within the vein.  Dermabond was applied.  The patient taken the recovery room in stable condition   Disposition:  To PACU stable   V. [name], M.D. [facility] [place] Office: [phone] Pager:  [phone]

## 2016-12-06 NOTE — Telephone Encounter (Signed)
Sched appt 01/13/17 at 11:30. Spoke to pt.

## 2016-12-09 NOTE — Progress Notes (Signed)
Carelink Summary Report / Loop Recorder 

## 2016-12-10 LAB — CUP PACEART REMOTE DEVICE CHECK
Implantable Pulse Generator Implant Date: 20170321
MDC IDC SESS DTM: 20181012234146

## 2016-12-11 ENCOUNTER — Inpatient Hospital Stay (HOSPITAL_COMMUNITY): Admission: RE | Admit: 2016-12-11 | Payer: Medicare Other | Source: Ambulatory Visit

## 2016-12-17 ENCOUNTER — Encounter (HOSPITAL_COMMUNITY)
Admission: RE | Admit: 2016-12-17 | Discharge: 2016-12-17 | Disposition: A | Discharge status: Home / Self Care | Payer: Medicare Other | Source: Ambulatory Visit | Attending: Nephrology | Admitting: Nephrology

## 2016-12-17 DIAGNOSIS — N184 Chronic kidney disease, stage 4 (severe): Secondary | ICD-10-CM | POA: Diagnosis present

## 2016-12-17 DIAGNOSIS — D638 Anemia in other chronic diseases classified elsewhere: Secondary | ICD-10-CM | POA: Insufficient documentation

## 2016-12-17 DIAGNOSIS — D631 Anemia in chronic kidney disease: Secondary | ICD-10-CM

## 2016-12-17 LAB — IRON AND TIBC
IRON: 87 ug/dL (ref 45–182)
Saturation Ratios: 34 % (ref 17.9–39.5)
TIBC: 255 ug/dL (ref 250–450)
UIBC: 168 ug/dL

## 2016-12-17 LAB — FERRITIN: FERRITIN: 276 ng/mL (ref 24–336)

## 2016-12-17 LAB — POCT HEMOGLOBIN-HEMACUE: Hemoglobin: 12.9 g/dL — ABNORMAL LOW (ref 13.0–17.0)

## 2016-12-17 MED ORDER — DARBEPOETIN ALFA 60 MCG/0.3ML IJ SOSY: 50.0000 ug | PREFILLED_SYRINGE | INTRAMUSCULAR | Status: DC

## 2016-12-31 ENCOUNTER — Inpatient Hospital Stay (HOSPITAL_COMMUNITY)
Admission: RE | Admit: 2016-12-31 | Discharge: 2016-12-31 | Disposition: A | Discharge status: Home / Self Care | Payer: Medicare Other | Source: Ambulatory Visit | Attending: Nephrology | Admitting: Nephrology

## 2017-01-06 ENCOUNTER — Ambulatory Visit (INDEPENDENT_AMBULATORY_CARE_PROVIDER_SITE_OTHER): Payer: Medicare Other | Admitting: *Deleted

## 2017-01-06 DIAGNOSIS — I639 Cerebral infarction, unspecified: Secondary | ICD-10-CM

## 2017-01-06 NOTE — Progress Notes (Signed)
Carelink Summary Report / Loop Recorder 

## 2017-01-13 ENCOUNTER — Encounter: Payer: Self-pay | Admitting: Surgery

## 2017-01-13 ENCOUNTER — Ambulatory Visit (INDEPENDENT_AMBULATORY_CARE_PROVIDER_SITE_OTHER): Payer: Medicare Other | Admitting: Surgery

## 2017-01-13 VITALS — BP 147/64 | HR 58 | Temp 97.3°F | Resp 20 | Ht 68.0 in | Wt 180.4 lb

## 2017-01-13 DIAGNOSIS — N184 Chronic kidney disease, stage 4 (severe): Secondary | ICD-10-CM

## 2017-01-13 LAB — CUP PACEART REMOTE DEVICE CHECK
Date Time Interrogation Session: 20181112003912
Implantable Pulse Generator Implant Date: 20170321

## 2017-01-13 NOTE — Progress Notes (Signed)
Patient name: Robert [name] MRN: [id number] DOB: [birth year] Sex: male  REASON FOR VISIT:     post op  HISTORY OF PRESENT ILLNESS:   Robert [name] is a 81 y.o. male who returns today for his first postoperative visit.  He is status post elevation and branch ligation of a right radiocephalic fistula on 58/68/2574.  He is not yet on dialysis.  He has no complaints of steal.  He feels his incisions are healing nicely.  CURRENT MEDICATIONS:    Current Outpatient Medications  Medication Sig Dispense Refill  . albuterol (PROVENTIL HFA;VENTOLIN HFA) 108 (90 Base) MCG/ACT inhaler Inhale 2 puffs into the lungs every 6 (six) hours as needed for wheezing or shortness of breath.    [name] aspirin 325 MG EC tablet Take 325 mg by mouth daily.    . calcitRIOL (ROCALTROL) 0.25 MCG capsule Take 0.25 mcg by mouth. Monday, Wednesday and Friday.    . clopidogrel (PLAVIX) 75 MG tablet Take 1 tablet (75 mg total) by mouth daily. 30 tablet 1  . ezetimibe (ZETIA) 10 MG tablet Take 1 tablet (10 mg total) by mouth daily. 30 tablet 1  . fexofenadine (ALLEGRA) 180 MG tablet Take 180 mg by mouth daily.    . fluticasone furoate-vilanterol (BREO ELLIPTA) 100-25 MCG/INH AEPB Inhale 1 puff into the lungs daily as needed (congestion).    . furosemide (LASIX) 40 MG tablet Take 40 mg by mouth 2 (two) times daily.    [name] HUMALOG 100 UNIT/ML injection 30 units basal over 24 hours and bolus is up to 36 units per 24 hours in VGO 30  5  . hydrALAZINE (APRESOLINE) 100 MG tablet Take 100 mg by mouth 2 (two) times daily.     . Insulin Disposable Pump (V-GO 30) KIT as directed.     [name] levothyroxine (SYNTHROID, LEVOTHROID) 50 MCG tablet Take 50 mcg by mouth daily before breakfast.     . metoprolol succinate (TOPROL-XL) 50 MG 24 hr tablet Take 50 mg by mouth 2 (two) times daily.     . Multiple Vitamins-Minerals (MULTIVITAMIN WITH MINERALS) tablet Take 1 tablet by mouth daily.    [name] omeprazole  (PRILOSEC) 20 MG capsule Take 20 mg by mouth every evening.     [name] oxyCODONE-acetaminophen (PERCOCET/ROXICET) 5-325 MG tablet Take 1 tablet by mouth every 6 (six) hours as needed for moderate pain. 10 tablet 0  . pantoprazole (PROTONIX) 40 MG tablet Take 40 mg by mouth daily.    . sertraline (ZOLOFT) 25 MG tablet Take 25 mg by mouth daily.    . simvastatin (ZOCOR) 40 MG tablet Take 1 tablet (40 mg total) by mouth daily at 6 PM. (Patient taking differently: Take 40 mg by mouth every evening. ) 30 tablet 0   No current facility-administered medications for this visit.     REVIEW OF SYSTEMS:   '[X]'  denotes positive finding, '[ ]'  denotes negative finding Cardiac  Comments:  Chest pain or chest pressure:    Shortness of breath upon exertion:    Short of breath when lying flat:    Irregular heart rhythm:    Constitutional    Fever or chills:      PHYSICAL EXAM:   Vitals:   01/13/17 1220 01/13/17 1221  BP: (!) 151/62 (!) 147/64  Pulse: (!) 58   Resp: 20   Temp: (!) 97.3 F (36.3 C)   TempSrc: Oral   SpO2: 98%   Weight: 180 lb 6.4 oz (81.8 kg)  Height: '5\' 8"'  (1.727 m)     GENERAL: The patient is a well-nourished male, in no acute distress. The vital signs are documented above. CARDIOVASCULAR: There is a regular rate and rhythm. PULMONARY: Non-labored respirations Excellent thrill within right radiocephalic fistula.  All incisions have healed.  STUDIES:   None   MEDICAL ISSUES:   The patient's fistula is ready for use should he require dialysis initiation.  He will follow-up with me on an as-needed basis.  [name], MD [facility] [phone] Pager [phone]

## 2017-01-30 ENCOUNTER — Ambulatory Visit: Payer: Medicare Other | Admitting: Neurology

## 2017-01-30 ENCOUNTER — Telehealth: Payer: Self-pay

## 2017-01-30 NOTE — Telephone Encounter (Signed)
Patient no show for appt today. 

## 2017-01-31 ENCOUNTER — Encounter: Payer: Self-pay | Admitting: Neurology

## 2017-02-04 ENCOUNTER — Ambulatory Visit (INDEPENDENT_AMBULATORY_CARE_PROVIDER_SITE_OTHER): Payer: Medicare Other | Admitting: *Deleted

## 2017-02-04 DIAGNOSIS — I639 Cerebral infarction, unspecified: Secondary | ICD-10-CM | POA: Diagnosis not present

## 2017-02-05 NOTE — Progress Notes (Signed)
Carelink Summary Report / Loop Recorder 

## 2017-02-12 ENCOUNTER — Telehealth: Payer: Self-pay | Admitting: Cardiology

## 2017-02-12 LAB — CUP PACEART REMOTE DEVICE CHECK
Date Time Interrogation Session: 20181212004138
MDC IDC PG IMPLANT DT: 20170321

## 2017-02-12 NOTE — Telephone Encounter (Signed)
LMOVM requesting that pt send manual transmission b/c home monitor has not updated in at least 14 days.    

## 2017-02-19 ENCOUNTER — Telehealth: Payer: Self-pay | Admitting: *Deleted

## 2017-02-19 NOTE — Telephone Encounter (Signed)
Spoke with patient regarding sending manual transmission to review 5 "AF" episodes. Manual transmission received. 5 AF episodes appear, SR with PACs/PVCs. Will continue to monitor.

## 2017-03-06 ENCOUNTER — Ambulatory Visit (INDEPENDENT_AMBULATORY_CARE_PROVIDER_SITE_OTHER): Payer: Medicare Other | Admitting: *Deleted

## 2017-03-06 DIAGNOSIS — I639 Cerebral infarction, unspecified: Secondary | ICD-10-CM | POA: Diagnosis not present

## 2017-03-11 NOTE — Progress Notes (Signed)
Carelink Summary Report / Loop Recorder 

## 2017-03-18 ENCOUNTER — Other Ambulatory Visit: Payer: Self-pay | Admitting: Internal Medicine

## 2017-03-18 LAB — CUP PACEART REMOTE DEVICE CHECK
Date Time Interrogation Session: 20190111011135
MDC IDC PG IMPLANT DT: 20170321

## 2017-04-07 ENCOUNTER — Ambulatory Visit (INDEPENDENT_AMBULATORY_CARE_PROVIDER_SITE_OTHER): Payer: Medicare Other | Admitting: *Deleted

## 2017-04-07 ENCOUNTER — Encounter: Payer: Self-pay | Admitting: Neurology

## 2017-04-07 ENCOUNTER — Ambulatory Visit: Payer: Medicare Other | Admitting: Neurology

## 2017-04-07 VITALS — BP 122/65 | HR 60 | Wt 174.8 lb

## 2017-04-07 DIAGNOSIS — I6990 Unspecified sequelae of unspecified cerebrovascular disease: Secondary | ICD-10-CM | POA: Diagnosis not present

## 2017-04-07 DIAGNOSIS — I639 Cerebral infarction, unspecified: Secondary | ICD-10-CM

## 2017-04-07 NOTE — Progress Notes (Addendum)
[facility] [address] [place]. [place] [address] [phone]       OFFICE FOLLOW-UP NOTE  Robert. Robert [name] Date of Birth:  10-Dec-1935 Medical Record Number:  [id number]   HPI: Robert [name] is a 82 year male who was seen on 07/31/16 for first office follow-up visit following hospital admission for stroke in March 2018. Is a complaint by his son. History is obtained from the patient, son, review of Hospital medical records and have personally reviewed imaging films. Robert [name] is an 82 y.o. male who was LKN at "about 1:30 before taking a nap". On awakening, his voice was hoarse. He drove to his daughter's house; she felt that his speech was slurred and that he was having a stroke. She drove him to the [facility] ED where a Code Stroke was called.  At time of CT scan, the patient denied any weakness. He also denied vision changes. The patient stated that he has acid reflux but has not become hoarse from this in the past. LSN: 1:30 PM on 05/20/2016.tPA Given: No, out of IV tPA time window.NIHSS: 2 (mild dysarthria and left hand sensory impairment) MRI scan of the brain showed scattered right MCA punctate infarcts which are felt to be embolic. MRA of the brain showed moderate bilateral cavernous carotid atherosclerotic changes. Carotid Doppler showed 60-79% right ICA stenosis which appeared to her have progressed since previous carotid ultrasound from March 2017 when it was 40-59% stenosis. Lower extremity venous Dopplers were negative for DVT. Transthoracic echo showed normal ejection fraction. Patient had a loop interpretation from loop recorder which was previously placed in prior admission which did not show it to fibrillation. LDL cholesterol could not be calculated due to significantly elevated triglycerides of 749 mg percent. Urine drug screen was negative. Hemoglobin A1c was elevated at 8.3. Patient was previously on aspirin and Plavix was added for stroke prevention. Patient  underwent elective right carotid endarterectomy by Dr. Trula Slade for few weeks later which was uneventful. [facility] she'll get ongoing therapies. He is currently at home and getting home physical and occupation therapy. Is opting improvement in his speech and facial weakness but left hand weakness persist. He is tolerating aspirin and Plavix with minor bruising but no bleeding. His blood pressure is well controlled usually in today to 133/60. Is tolerating Zocor well without muscle aches or pains. His fasting sugars have all ranged in the 80-90 range mostly. He had surgery for creation of dialysis fistula in the right arm by Dr. Trula Slade a few weeks ago in anticipation for dialysis Update 04/07/17: Patient being seen today for 6 month follow up. Patient doing well overall with some residual left hand weakness but has been improving. Patient living at home with wife. Patient does all IADLs and ADLs and helps assist wife with ADLs. Patient continues to take aspirin 351m and Plavix 716mwith moderate amount of bruising on hands/arms. Patient also continues to take simvastatin without side effects. Patient is unsure if lipid levels have been checked recently by PCP but believes his last A1c was at 6.8. Blood pressure has been satisfactory and at todays visit 122/65. Patient states he has had a decrease in appetite lately. Previous weight taken on 03/27/17 - 180; todays weight 174. Patient will monitor this and follow up with PCP. Loop recorder negative  so far for atrial fibrillation. Denies new/worseing stroke/TIA symptoms. The patient has not had any follow-up with his vascular surgeon and  cannot tell me in the last time he had carotid ultrasound performed  ROS:   14 system review of systems is positive for left hand weakness and all other systems negative  PMH:  Past Medical History:  Diagnosis Date  . Allergy    year round  . Anemia   . Cancer (HCC)    PSA ELEVATED  .  Carotid artery occlusion   . Cataract   . Chronic kidney disease    DR MATTINGLY   . Depression   . Diabetes mellitus   . Dyspnea   . GERD (gastroesophageal reflux disease)   . Hearing deficit   . History of kidney stones   . Hyperlipidemia   . Hypertension   . Myocardial infarction ([place]) 2011  . Stroke (cerebrum) (HCC)    weakness in left hand  04/2015, 05/20/16  . Thyroid disease    hypothyroid    Social History:  Social History   Socioeconomic History  . Marital status: Married    Spouse name: Not on file  . Number of children: Not on file  . Years of education: Not on file  . Highest education level: Not on file  Social Needs  . Financial resource strain: Not on file  . Food insecurity - worry: Not on file  . Food insecurity - inability: Not on file  . Transportation needs - medical: Not on file  . Transportation needs - non-medical: Not on file  Occupational History  . Not on file  Tobacco Use  . Smoking status: Former Research scientist (life sciences)  . Smokeless tobacco: Never Used  Substance and Sexual Activity  . Alcohol use: Yes    Alcohol/week: 1.2 oz    Types: 2 Glasses of wine per week    Comment: rarely  . Drug use: No  . Sexual activity: Not on file  Other Topics Concern  . Not on file  Social History Narrative  . Not on file    Medications:   Current Outpatient Medications on File Prior to Visit  Medication Sig Dispense Refill  . albuterol (PROVENTIL HFA;VENTOLIN HFA) 108 (90 Base) MCG/ACT inhaler Inhale 2 puffs into the lungs every 6 (six) hours as needed for wheezing or shortness of breath.    [name] aspirin 325 MG EC tablet Take 325 mg by mouth daily.    . calcitRIOL (ROCALTROL) 0.25 MCG capsule Take 0.25 mcg by mouth. Monday, Wednesday and Friday.    . clopidogrel (PLAVIX) 75 MG tablet Take 1 tablet (75 mg total) by mouth daily. 30 tablet 1  . ezetimibe (ZETIA) 10 MG tablet Take 1 tablet (10 mg total) by mouth daily. 30 tablet 1  . fexofenadine (ALLEGRA) 180 MG tablet  Take 180 mg by mouth daily.    . fluticasone furoate-vilanterol (BREO ELLIPTA) 100-25 MCG/INH AEPB Inhale 1 puff into the lungs daily as needed (congestion).    . furosemide (LASIX) 40 MG tablet Take 40 mg by mouth 2 (two) times daily.    [name] HUMALOG 100 UNIT/ML injection 30 units basal over 24 hours and bolus is up to 36 units per 24 hours in VGO 30  5  . hydrALAZINE (APRESOLINE) 100 MG tablet Take 100 mg by mouth 2 (two) times daily.     . Insulin Disposable Pump (V-GO 30) KIT as directed.     . insulin lispro (HUMALOG) 100 UNIT/ML injection Inject into the skin.    [name] levothyroxine (SYNTHROID, LEVOTHROID) 50 MCG tablet Take 50 mcg by mouth daily before breakfast.     .  metoprolol succinate (TOPROL-XL) 50 MG 24 hr tablet Take 50 mg by mouth 2 (two) times daily.     . Multiple Vitamins-Minerals (MULTIVITAMIN WITH MINERALS) tablet Take 1 tablet by mouth daily.    . pantoprazole (PROTONIX) 40 MG tablet Take 40 mg by mouth daily.    . sertraline (ZOLOFT) 25 MG tablet Take 25 mg by mouth daily.    . simvastatin (ZOCOR) 40 MG tablet Take 1 tablet (40 mg total) by mouth daily at 6 PM. (Patient taking differently: Take 40 mg by mouth every evening. ) 30 tablet 0   No current facility-administered medications on file prior to visit.     Allergies:   Allergies  Allergen Reactions  . Lisinopril Other (See Comments)    "made me lose my voice"    Physical Exam General: well developed, well nourished elderly Caucasian male, seated, in no evident distress Head: head normocephalic and atraumatic.  Neck: supple with soft right carotid bruit heard Cardiovascular: regular rate and rhythm, no murmurs Musculoskeletal: no deformity Skin:  no rash/petichiae; intact scar in right side of neck Vascular:  Normal pulses all extremities Vitals:   04/07/17 1038  BP: 122/65  Pulse: 60   Neurologic Exam Mental Status: Awake and fully alert. Oriented to place and time. Recent and remote memory intact. Attention  span, concentration and fund of knowledge appropriate. Mood and affect appropriate.  Cranial Nerves: Fundoscopic exam reveals sharp disc margins. Pupils equal, briskly reactive to light. Extraocular movements full without nystagmus. Visual fields full to confrontation. Hearing intact. Facial sensation intact. Face, tongue, palate moves normally and symmetrically.  Motor: Normal bulk and tone. Normal strength in all tested extremity muscles except significant weakness of left grip. Diminished fine finger movements on the left. Orbits right over left approximately.. Sensory.: sensory intact and symmetric throughout all extremities. Coordination: Rapid alternating movements normal in all extremities. Finger-to-nose and heel-to-shin performed accurately bilaterally. Gait and Station: Arises from chair without difficulty. Stance is normal. Gait demonstrates mildly shorter stride. Able to heel, toe and tandem walk without difficulty.  Reflexes: 2+ and symmetric. Toes downgoing.   NIHSS  0 Modified Rankin  2  ASSESSMENT: 48 year Caucasian male with a right MCA branch embolic infarcts from proximal right carotid stenosis status post carotid endarterectomy electively. He has mild residual left hand weakness. Vascular risk factors of diabetes, hypertension, hyperlipidemia and extracranial carotid stenosis.No new concerns at todays follow up visit.   PLAN: I had a long d/w patient about his recent stroke, risk for recurrent stroke/TIAs, personally independently reviewed imaging studies and stroke evaluation results and answered questions.Continue aspirin 325 mg daily and clopidogrel 75 mg daily  for secondary stroke prevention and maintain strict control of hypertension with blood pressure goal below 130/90, diabetes with hemoglobin A1c goal below 6.5% and lipids with LDL cholesterol goal below 70 mg/dL. I also advised the patient to eat a healthy diet with plenty of whole grains, cereals, fruits and vegetables,  exercise regularly and maintain ideal body weight. Followup in the future with me in 6 months. -from neurology stand point - okay for patient to d/c aspirin 334m and take Plavix 766monly - patient will follow up with cardiologist -ordered carotid ultrasound to follow up on carotid endartectomy Greater than 50% of time during this 25 minute visit was spent on counseling,explanation of diagnosis, planning of further management, discussion with patient and family and coordination of care PrAntony ContrasMD  [facility] [id number]  [address]  Phone [phone] Fax [phone] Note: This document was prepared with digital dictation and possible smart phrase technology. Any transcriptional errors that result from this process are unintentional

## 2017-04-07 NOTE — Progress Notes (Signed)
Carelink Summary Report / Loop Recorder 

## 2017-04-07 NOTE — Patient Instructions (Signed)
I had a long d/w patient about his recent stroke, risk for recurrent stroke/TIAs, personally independently reviewed imaging studies and stroke evaluation results and answered questions.Continue aspirin 325 mg daily and clopidogrel 75 mg daily  for secondary stroke prevention and maintain strict control of hypertension with blood pressure goal below 130/90, diabetes with hemoglobin A1c goal below 6.5% and lipids with LDL cholesterol goal below 70 mg/dL. I also advised the patient to eat a healthy diet with plenty of whole grains, cereals, fruits and vegetables, exercise regularly and maintain ideal body weight. Followup in the future with me in 6 months. -please follow up with cardiologist regarding continuation of both aspirin and plavix -ordered carotid ultrasound to follow up on carotid endartectomy -follow up in 6 months or earlier if needed

## 2017-04-17 ENCOUNTER — Telehealth: Payer: Self-pay | Admitting: Neurology

## 2017-04-17 ENCOUNTER — Ambulatory Visit (HOSPITAL_COMMUNITY)
Admission: RE | Admit: 2017-04-17 | Discharge: 2017-04-17 | Disposition: A | Discharge status: Home / Self Care | Payer: Medicare Other | Source: Ambulatory Visit | Attending: Adult Health | Admitting: Adult Health

## 2017-04-17 DIAGNOSIS — E785 Hyperlipidemia, unspecified: Secondary | ICD-10-CM | POA: Insufficient documentation

## 2017-04-17 DIAGNOSIS — I6990 Unspecified sequelae of unspecified cerebrovascular disease: Secondary | ICD-10-CM | POA: Diagnosis not present

## 2017-04-17 DIAGNOSIS — I6523 Occlusion and stenosis of bilateral carotid arteries: Secondary | ICD-10-CM | POA: Insufficient documentation

## 2017-04-17 DIAGNOSIS — I10 Essential (primary) hypertension: Secondary | ICD-10-CM | POA: Insufficient documentation

## 2017-04-17 DIAGNOSIS — E119 Type 2 diabetes mellitus without complications: Secondary | ICD-10-CM | POA: Diagnosis not present

## 2017-04-17 NOTE — Telephone Encounter (Signed)
Robert [name] with the vas lab at [facility] is requesting a call back sometime today at [phone]

## 2017-04-17 NOTE — Progress Notes (Signed)
VASCULAR LAB PRELIMINARY  PRELIMINARY  PRELIMINARY  PRELIMINARY  Carotid duplex completed.    Preliminary report:  60-79% right mid ICA stenosis, post CEA.  40-59% left ICA stenosis. Vertebral artery flow is antegrade.   Alonnah Lampkins, RVT 04/17/2017, 12:01 PM

## 2017-04-17 NOTE — Telephone Encounter (Signed)
No. Have him follow up with his vascular surgeon and let him decide the follow-up plan for surveillance carotid Dopplers

## 2017-04-18 NOTE — Telephone Encounter (Signed)
Called patient regarding results from recent carotid ultrasound on 04/17/17. Patient was advised to schedule appointment with vascular surgeon, Dr. Trula Slade, to follow up on carotid ultrasound due to 60-79% right ICA stenosis. This was the amount of stenosis that was present prior to R CEA on 06/06/16. Patient stated understanding and will follow up with vascular surgeon, Dr. Trula Slade. Patient thankful for call.

## 2017-04-22 ENCOUNTER — Telehealth: Payer: Self-pay | Admitting: *Deleted

## 2017-04-22 NOTE — Telephone Encounter (Signed)
Follow up appointment with Dr. Trula Slade requested by Dr. Leonie Man office s/p Carotid duplex. 05/16/17 4pm time given. When reviewed, patient denies any signs or symptoms or TIA/stroke. Instructed to call 911 to get to ER if any symptoms.

## 2017-04-22 NOTE — Telephone Encounter (Signed)
LMOVM (DPR) requesting manual transmission from Carelink monitor.  Gave step-by-step instructions and [facility] phone number for questions/concerns.

## 2017-04-25 ENCOUNTER — Encounter (HOSPITAL_COMMUNITY)
Admission: RE | Admit: 2017-04-25 | Discharge: 2017-04-25 | Disposition: A | Discharge status: Home / Self Care | Payer: Medicare Other | Source: Ambulatory Visit | Attending: Nephrology | Admitting: Nephrology

## 2017-04-25 VITALS — BP 137/58 | HR 59 | Temp 97.5°F | Resp 20

## 2017-04-25 DIAGNOSIS — N185 Chronic kidney disease, stage 5: Secondary | ICD-10-CM | POA: Diagnosis present

## 2017-04-25 DIAGNOSIS — D631 Anemia in chronic kidney disease: Secondary | ICD-10-CM | POA: Diagnosis present

## 2017-04-25 DIAGNOSIS — N184 Chronic kidney disease, stage 4 (severe): Secondary | ICD-10-CM

## 2017-04-25 LAB — IRON AND TIBC
IRON: 68 ug/dL (ref 45–182)
Saturation Ratios: 26 % (ref 17.9–39.5)
TIBC: 262 ug/dL (ref 250–450)
UIBC: 194 ug/dL

## 2017-04-25 LAB — FERRITIN: Ferritin: 447 ng/mL — ABNORMAL HIGH (ref 24–336)

## 2017-04-25 LAB — POCT HEMOGLOBIN-HEMACUE: Hemoglobin: 9.4 g/dL — ABNORMAL LOW (ref 13.0–17.0)

## 2017-04-25 MED ORDER — EPOETIN ALFA 20000 UNIT/ML IJ SOLN
INTRAMUSCULAR | Status: AC
  Administered 2017-04-25: 20000 [IU] via SUBCUTANEOUS
  Filled 2017-04-25: qty 1

## 2017-04-25 MED ORDER — EPOETIN ALFA 20000 UNIT/ML IJ SOLN
20000.0000 [IU] | INTRAMUSCULAR | Status: DC
  Administered 2017-04-25: 20000 [IU] via SUBCUTANEOUS

## 2017-04-29 LAB — CUP PACEART REMOTE DEVICE CHECK
Implantable Pulse Generator Implant Date: 20170321
MDC IDC SESS DTM: 20190210014021

## 2017-04-29 NOTE — Telephone Encounter (Signed)
LMOVM requesting manual transmission. Gave [facility] phone number to call back.

## 2017-04-30 ENCOUNTER — Telehealth: Payer: Self-pay | Admitting: Neurology

## 2017-04-30 NOTE — Telephone Encounter (Signed)
Called pt twice and left voicemails to reschedule him. Letter sent

## 2017-05-02 ENCOUNTER — Encounter (HOSPITAL_COMMUNITY): Payer: Medicare Other

## 2017-05-02 NOTE — Telephone Encounter (Signed)
LVMOM requesting manual transmission. [facility] phone number to call back with questions or concerns.

## 2017-05-06 ENCOUNTER — Encounter: Payer: Self-pay | Admitting: *Deleted

## 2017-05-06 NOTE — Telephone Encounter (Signed)
Attempted to reach patient at home number to request manual transmission.  No answer, home VM full.

## 2017-05-06 NOTE — Telephone Encounter (Signed)
Four attempts to reach patient via phone unsuccessful.  Mailed letter with manual transmission instructions to home address on file.

## 2017-05-08 ENCOUNTER — Ambulatory Visit (INDEPENDENT_AMBULATORY_CARE_PROVIDER_SITE_OTHER): Payer: Medicare Other | Admitting: *Deleted

## 2017-05-08 ENCOUNTER — Other Ambulatory Visit (HOSPITAL_COMMUNITY): Payer: Self-pay | Admitting: *Deleted

## 2017-05-08 DIAGNOSIS — I639 Cerebral infarction, unspecified: Secondary | ICD-10-CM

## 2017-05-09 ENCOUNTER — Encounter (HOSPITAL_COMMUNITY)
Admission: RE | Admit: 2017-05-09 | Discharge: 2017-05-09 | Disposition: A | Discharge status: Home / Self Care | Payer: Medicare Other | Source: Ambulatory Visit | Attending: Nephrology | Admitting: Nephrology

## 2017-05-09 VITALS — BP 150/49 | HR 56 | Temp 98.4°F | Resp 20 | Ht 68.0 in | Wt 168.0 lb

## 2017-05-09 DIAGNOSIS — N184 Chronic kidney disease, stage 4 (severe): Principal | ICD-10-CM

## 2017-05-09 DIAGNOSIS — D631 Anemia in chronic kidney disease: Secondary | ICD-10-CM

## 2017-05-09 DIAGNOSIS — N185 Chronic kidney disease, stage 5: Secondary | ICD-10-CM | POA: Diagnosis not present

## 2017-05-09 LAB — POCT HEMOGLOBIN-HEMACUE: HEMOGLOBIN: 9.6 g/dL — AB (ref 13.0–17.0)

## 2017-05-09 MED ORDER — SODIUM CHLORIDE 0.9 % IV SOLN
510.0000 mg | Freq: Once | INTRAVENOUS | Status: AC
  Administered 2017-05-09: 11:00:00 510 mg via INTRAVENOUS
  Filled 2017-05-09: qty 17

## 2017-05-09 MED ORDER — EPOETIN ALFA 20000 UNIT/ML IJ SOLN
INTRAMUSCULAR | Status: AC
  Administered 2017-05-09: 11:00:00 20000 [IU] via SUBCUTANEOUS
  Filled 2017-05-09: qty 1

## 2017-05-09 MED ORDER — EPOETIN ALFA 20000 UNIT/ML IJ SOLN: 20000.0000 [IU] | INTRAMUSCULAR | Status: DC

## 2017-05-09 NOTE — Progress Notes (Signed)
Carelink Summary Report / Loop Recorder 

## 2017-05-16 ENCOUNTER — Encounter: Payer: Self-pay | Admitting: Surgery

## 2017-05-16 ENCOUNTER — Other Ambulatory Visit: Payer: Self-pay

## 2017-05-16 ENCOUNTER — Ambulatory Visit (INDEPENDENT_AMBULATORY_CARE_PROVIDER_SITE_OTHER): Payer: Medicare Other | Admitting: Surgery

## 2017-05-16 VITALS — BP 166/67 | HR 59 | Temp 97.0°F | Resp 18 | Ht 68.0 in | Wt 171.0 lb

## 2017-05-16 DIAGNOSIS — I6521 Occlusion and stenosis of right carotid artery: Secondary | ICD-10-CM | POA: Diagnosis not present

## 2017-05-16 NOTE — Progress Notes (Signed)
[facility]  Patient name: Robert [name] MRN: [id number] DOB: 31-Dec-1935 Sex: male   REASON FOR VISIT:    Follow up  HISOTRY OF PRESENT ILLNESS:   Robert [name] a 82 y.o.male, who Presented to the hospital on 05/20/2016 with slurred speech. He was also felt to have a left facial droop. The patient does have a history of a prior stroke with similar symptoms. A noncontrast CT scan was negative for acute pathology. He was not given TPA. Carotid Doppler studies show 60-79% right carotid stenosis. His MRI was positive for right-sided infarct. He was hypertensive on arrival with blood pressure of 210/95.  On 06/14/2016, he underwent right carotid endarterectomy with patch angioplasty.  Intraoperative findings included a internal carotid 180 rotated.  The distal endpoint of the plaque was high up under the hypoglossal.  This was done without a shunt because of poor distal visualization.  He was discharged home on postoperative day 2.  He stated today because of nausea and the inability to void.    He has also undergone right radio-cephalic fistula creation as well as elevation and branch ligation.  His fistula is ready for use.  He is not yet on dialysis.  He has no new symptoms today.  PAST MEDICAL HISTORY:   Past Medical History:  Diagnosis Date  . Allergy    year round  . Anemia   . Cancer (HCC)    PSA ELEVATED  . Carotid artery occlusion   . Cataract   . Chronic kidney disease    DR MATTINGLY   . Depression   . Diabetes mellitus   . Dyspnea   . GERD (gastroesophageal reflux disease)   . Hearing deficit   . History of kidney stones   . Hyperlipidemia   . Hypertension   . Myocardial infarction ([place]) 2011  . Stroke (cerebrum) (HCC)    weakness in left hand  04/2015, 05/20/16  . Thyroid disease    hypothyroid     FAMILY HISTORY:   Family History  Problem Relation Age of Onset  . Dementia  Maternal Grandfather     SOCIAL HISTORY:   Social History   Tobacco Use  . Smoking status: Former Research scientist (life sciences)  . Smokeless tobacco: Never Used  Substance Use Topics  . Alcohol use: Yes    Alcohol/week: 1.2 oz    Types: 2 Glasses of wine per week    Comment: rarely     ALLERGIES:   Allergies  Allergen Reactions  . Lisinopril Other (See Comments)    "made me lose my voice"     CURRENT MEDICATIONS:   Current Outpatient Medications  Medication Sig Dispense Refill  . albuterol (PROVENTIL HFA;VENTOLIN HFA) 108 (90 Base) MCG/ACT inhaler Inhale 2 puffs into the lungs every 6 (six) hours as needed for wheezing or shortness of breath.    [name] aspirin 325 MG EC tablet Take 325 mg by mouth daily.    . calcitRIOL (ROCALTROL) 0.25 MCG capsule Take 0.25 mcg by mouth. Monday, Wednesday and Friday.    . clopidogrel (PLAVIX) 75 MG tablet Take 1 tablet (75 mg total) by mouth daily. 30 tablet 1  . ezetimibe (ZETIA) 10 MG tablet Take 1 tablet (10 mg total) by mouth daily. 30 tablet 1  . fexofenadine (ALLEGRA) 180 MG tablet Take 180 mg by mouth daily.    . fluticasone furoate-vilanterol (BREO ELLIPTA) 100-25 MCG/INH AEPB Inhale 1 puff into the lungs daily as needed (congestion).    [name]  furosemide (LASIX) 40 MG tablet Take 40 mg by mouth 2 (two) times daily.    [name] HUMALOG 100 UNIT/ML injection 30 units basal over 24 hours and bolus is up to 36 units per 24 hours in VGO 30  5  . hydrALAZINE (APRESOLINE) 100 MG tablet Take 100 mg by mouth 2 (two) times daily.     . Insulin Disposable Pump (V-GO 30) KIT as directed.     . insulin lispro (HUMALOG) 100 UNIT/ML injection Inject into the skin.    [name] levothyroxine (SYNTHROID, LEVOTHROID) 50 MCG tablet Take 50 mcg by mouth daily before breakfast.     . metoprolol succinate (TOPROL-XL) 50 MG 24 hr tablet Take 50 mg by mouth 2 (two) times daily.     . Multiple Vitamins-Minerals (MULTIVITAMIN WITH MINERALS) tablet Take 1 tablet by mouth daily.    . pantoprazole  (PROTONIX) 40 MG tablet Take 40 mg by mouth daily.    . sertraline (ZOLOFT) 25 MG tablet Take 25 mg by mouth daily.    . simvastatin (ZOCOR) 40 MG tablet Take 1 tablet (40 mg total) by mouth daily at 6 PM. (Patient taking differently: Take 40 mg by mouth every evening. ) 30 tablet 0   No current facility-administered medications for this visit.     REVIEW OF SYSTEMS:   '[X]'$  denotes positive finding, '[ ]'$  denotes negative finding Cardiac  Comments:  Chest pain or chest pressure:    Shortness of breath upon exertion:    Short of breath when lying flat:    Irregular heart rhythm:        Vascular    Pain in calf, thigh, or hip brought on by ambulation:    Pain in feet at night that wakes you up from your sleep:     Blood clot in your veins:    Leg swelling:         Pulmonary    Oxygen at home:    Productive cough:     Wheezing:         Neurologic    Sudden weakness in arms or legs:     Sudden numbness in arms or legs:     Sudden onset of difficulty speaking or slurred speech:    Temporary loss of vision in one eye:     Problems with dizziness:         Gastrointestinal    Blood in stool:     Vomited blood:         Genitourinary    Burning when urinating:     Blood in urine:        Psychiatric    Major depression:         Hematologic    Bleeding problems:    Problems with blood clotting too easily:        Skin    Rashes or ulcers:        Constitutional    Fever or chills:      PHYSICAL EXAM:   There were no vitals filed for this visit.  GENERAL: The patient is a well-nourished male, in no acute distress. The vital signs are documented above. CARDIAC: There is a regular rate and rhythm.  VASCULAR: no carotid bruit.  Good thrill in AVF PULMONARY: Non-labored respirations  MUSCULOSKELETAL: There are no major deformities or cyanosis. NEUROLOGIC: No focal weakness or paresthesias are detected. SKIN: There are no ulcers or rashes noted. PSYCHIATRIC: The patient  has a normal affect.  STUDIES:  I have ordered and reviewed his carotid duplex with the following findings: Right Carotid: Velocities in the right ICA are consistent with a 60-79%        stenosis. CEA appears to be reoccluding.  Left Carotid: Velocities in the left ICA are consistent with a 40-59% stenosis.  Vertebrals: Both vertebral arteries were patent with antegrade flow. Subclavians: Normal flow hemodynamics were seen in bilateral subclavian       arteries.  MEDICAL ISSUES:   S/p fistula:  Ready for use  Carotid stenosis:  S/p right CEA for symptomatic stenosis.  Currently asymptomatic with mild residual symptoms in left side.  U/S today shows recurrence.  I will follow up in 6 months with duplex.  Patient would be high risk re-do CEA given the distal extent of his original lesion.  Stenting is complicated by his renal status    [name], MD [facility] [phone] Pager [phone]

## 2017-05-16 NOTE — Progress Notes (Signed)
Vitals:   05/16/17 1607  BP: (!) 160/72  Pulse: (!) 59  Resp: 18  Temp: (!) 97 F (36.1 C)  TempSrc: Oral  SpO2: 98%  Weight: 171 lb (77.6 kg)  Height: 5\' 8"  (1.727 m)

## 2017-05-19 ENCOUNTER — Other Ambulatory Visit: Payer: Self-pay

## 2017-05-19 DIAGNOSIS — I6521 Occlusion and stenosis of right carotid artery: Secondary | ICD-10-CM

## 2017-05-23 ENCOUNTER — Encounter (HOSPITAL_COMMUNITY): Payer: Medicare Other

## 2017-05-26 ENCOUNTER — Encounter (HOSPITAL_COMMUNITY)
Admission: RE | Admit: 2017-05-26 | Discharge: 2017-05-26 | Disposition: A | Discharge status: Home / Self Care | Payer: Medicare Other | Source: Ambulatory Visit | Attending: Nephrology | Admitting: Nephrology

## 2017-05-26 VITALS — BP 183/70 | HR 62 | Temp 97.7°F | Resp 20

## 2017-05-26 DIAGNOSIS — N185 Chronic kidney disease, stage 5: Secondary | ICD-10-CM | POA: Diagnosis not present

## 2017-05-26 DIAGNOSIS — N184 Chronic kidney disease, stage 4 (severe): Secondary | ICD-10-CM

## 2017-05-26 DIAGNOSIS — D631 Anemia in chronic kidney disease: Secondary | ICD-10-CM | POA: Diagnosis present

## 2017-05-26 LAB — IRON AND TIBC
Iron: 41 ug/dL — ABNORMAL LOW (ref 45–182)
Saturation Ratios: 16 % — ABNORMAL LOW (ref 17.9–39.5)
TIBC: 253 ug/dL (ref 250–450)
UIBC: 212 ug/dL

## 2017-05-26 LAB — FERRITIN: FERRITIN: 430 ng/mL — AB (ref 24–336)

## 2017-05-26 LAB — POCT HEMOGLOBIN-HEMACUE: Hemoglobin: 11.6 g/dL — ABNORMAL LOW (ref 13.0–17.0)

## 2017-05-26 MED ORDER — EPOETIN ALFA 20000 UNIT/ML IJ SOLN
INTRAMUSCULAR | Status: AC
  Administered 2017-05-26: 20000 [IU] via SUBCUTANEOUS
  Filled 2017-05-26: qty 1

## 2017-05-26 MED ORDER — EPOETIN ALFA 20000 UNIT/ML IJ SOLN
20000.0000 [IU] | INTRAMUSCULAR | Status: DC
  Administered 2017-05-26: 20000 [IU] via SUBCUTANEOUS

## 2017-06-06 ENCOUNTER — Encounter (HOSPITAL_COMMUNITY): Payer: Medicare Other

## 2017-06-10 ENCOUNTER — Ambulatory Visit (INDEPENDENT_AMBULATORY_CARE_PROVIDER_SITE_OTHER): Payer: Medicare Other | Admitting: *Deleted

## 2017-06-10 DIAGNOSIS — I639 Cerebral infarction, unspecified: Secondary | ICD-10-CM

## 2017-06-11 NOTE — Progress Notes (Signed)
Carelink Summary Report / Loop Recorder 

## 2017-06-12 ENCOUNTER — Encounter (HOSPITAL_COMMUNITY): Payer: Medicare Other

## 2017-06-13 ENCOUNTER — Telehealth: Payer: Self-pay | Admitting: *Deleted

## 2017-06-13 NOTE — Telephone Encounter (Signed)
Spoke with patient to request manual Carelink transmission for review.  Patient agrees to transmit after he is done eating dinner.  Alert received for 2 "AF" episodes--available ECG shows SR w/PACs.  Will review additional episode when transmission received.

## 2017-06-16 LAB — CUP PACEART REMOTE DEVICE CHECK
Date Time Interrogation Session: 20190315013632
MDC IDC PG IMPLANT DT: 20170321

## 2017-06-17 NOTE — Telephone Encounter (Signed)
Manual transmission received on 06/13/17--all available ECGs show SR w/PACs and rare PVCs.

## 2017-06-19 ENCOUNTER — Telehealth: Payer: Self-pay | Admitting: *Deleted

## 2017-06-19 ENCOUNTER — Other Ambulatory Visit (HOSPITAL_COMMUNITY): Payer: Self-pay | Admitting: *Deleted

## 2017-06-19 NOTE — Telephone Encounter (Signed)
Call from patient's daughter, c/o moderate to severe swelling right arm and hand. States arm and hand pink and warm, tender to touch. Finger tips pale and unable to bend fingers and hand due to swelling.He is not taking anything for pain.  Agreeable to see Vinnie Level on 06/20/17. Instructed to get hand above heart level and work hand and fingers in attempt to reduce swelling. Instructed to report to [facility] ER for any worsening condition.

## 2017-06-20 ENCOUNTER — Telehealth: Payer: Self-pay | Admitting: *Deleted

## 2017-06-20 ENCOUNTER — Ambulatory Visit (HOSPITAL_COMMUNITY)
Admission: RE | Admit: 2017-06-20 | Discharge: 2017-06-20 | Disposition: A | Discharge status: Home / Self Care | Payer: Medicare Other | Source: Ambulatory Visit | Attending: Family | Admitting: Family

## 2017-06-20 ENCOUNTER — Other Ambulatory Visit: Payer: Self-pay | Admitting: Family

## 2017-06-20 ENCOUNTER — Encounter: Payer: Self-pay | Admitting: Family

## 2017-06-20 ENCOUNTER — Ambulatory Visit (INDEPENDENT_AMBULATORY_CARE_PROVIDER_SITE_OTHER): Payer: Medicare Other | Admitting: Family

## 2017-06-20 ENCOUNTER — Encounter (HOSPITAL_COMMUNITY)
Admission: RE | Admit: 2017-06-20 | Discharge: 2017-06-20 | Disposition: A | Discharge status: Home / Self Care | Payer: Medicare Other | Source: Ambulatory Visit | Attending: Nephrology | Admitting: Nephrology

## 2017-06-20 VITALS — BP 143/65 | HR 56 | Temp 97.1°F | Resp 18 | Ht 68.0 in | Wt 171.0 lb

## 2017-06-20 VITALS — BP 163/67 | HR 54 | Temp 98.2°F | Resp 20 | Ht 68.0 in | Wt 160.0 lb

## 2017-06-20 DIAGNOSIS — I770 Arteriovenous fistula, acquired: Secondary | ICD-10-CM | POA: Diagnosis not present

## 2017-06-20 DIAGNOSIS — R609 Edema, unspecified: Secondary | ICD-10-CM | POA: Diagnosis not present

## 2017-06-20 DIAGNOSIS — N185 Chronic kidney disease, stage 5: Secondary | ICD-10-CM | POA: Diagnosis not present

## 2017-06-20 DIAGNOSIS — M7989 Other specified soft tissue disorders: Secondary | ICD-10-CM

## 2017-06-20 DIAGNOSIS — N184 Chronic kidney disease, stage 4 (severe): Secondary | ICD-10-CM | POA: Diagnosis not present

## 2017-06-20 DIAGNOSIS — D631 Anemia in chronic kidney disease: Secondary | ICD-10-CM

## 2017-06-20 LAB — POCT HEMOGLOBIN-HEMACUE: HEMOGLOBIN: 11 g/dL — AB (ref 13.0–17.0)

## 2017-06-20 MED ORDER — EPOETIN ALFA 20000 UNIT/ML IJ SOLN
INTRAMUSCULAR | Status: AC
  Filled 2017-06-20: qty 1

## 2017-06-20 MED ORDER — EPOETIN ALFA 20000 UNIT/ML IJ SOLN
20000.0000 [IU] | INTRAMUSCULAR | Status: DC
  Administered 2017-06-20: 09:00:00 20000 [IU] via SUBCUTANEOUS

## 2017-06-20 MED ORDER — SODIUM CHLORIDE 0.9 % IV SOLN
510.0000 mg | Freq: Once | INTRAVENOUS | Status: AC
  Administered 2017-06-20: 09:00:00 510 mg via INTRAVENOUS
  Filled 2017-06-20: qty 17

## 2017-06-20 NOTE — Progress Notes (Signed)
CC: swelling in right arm and hand, hx of AV fistula creation right arm, not yet on hemodialysis   History of Present Illness  Robert [name] is a 82 y.o. (1935-04-10) male who is s/p right radio-cephalic fistula creation (07-25-16 by Dr. Trula Slade) as well as elevation and branch ligation (12-05-16 by Dr. Trula Slade).    Call from patient's daughter yesterday, c/o moderate to severe swelling right arm and hand. States arm and hand pink and warm, tender to touch. Finger tips pale and unable to bend fingers and hand due to swelling.He is not taking anything for pain. The swelling started 4 days ago, pt states he might have slept on his right arm. He is not able to use his right hand as he is unable to flex his right fingers since the swelling started 4 days ago, no problem with his right hand until then.   Pt has not yet started hemodialysis, daughter states his nephrologist has not indicated that he needs to start soon.   On 06/14/2016, he underwent right carotid endarterectomy with patch angioplasty.  Dr. Trula Slade last evaluated pt on 05-16-17. At that time pt was asymptomatic with mild residual symptoms in left side.  Carotid duplex that day showed recurrence. Dr. Trula Slade advised  follow up in 6 months with duplex.  Patient would be high risk re-do CEA given the distal extent of his original lesion. Stenting is complicated by his renal status.  He has an appointment on 11-17-17 for carotid duplex and see Dr. Trula Slade.    The patient is right hand dominant.   Past Medical History:  Diagnosis Date  . Allergy    year round  . Anemia   . Cancer (HCC)    PSA ELEVATED  . Carotid artery occlusion   . Cataract   . Chronic kidney disease    DR MATTINGLY   . Depression   . Diabetes mellitus   . Dyspnea   . GERD (gastroesophageal reflux disease)   . Hearing deficit   . History of kidney stones   . Hyperlipidemia   . Hypertension   . Myocardial infarction ([place]) 2011  . Stroke  (cerebrum) (HCC)    weakness in left hand  04/2015, 05/20/16  . Thyroid disease    hypothyroid    Social History Social History   Tobacco Use  . Smoking status: Former Research scientist (life sciences)  . Smokeless tobacco: Never Used  Substance Use Topics  . Alcohol use: Yes    Alcohol/week: 1.2 oz    Types: 2 Glasses of wine per week    Comment: rarely  . Drug use: No    Family History Family History  Problem Relation Age of Onset  . Dementia Maternal Grandfather     Surgical History Past Surgical History:  Procedure Laterality Date  . AV FISTULA PLACEMENT Right 07/25/2016   Procedure: ARTERIOVENOUS (AV) FISTULA CREATION-RIGHT;  Surgeon: Serafina Mitchell, MD;  Location: [facility];  Service: Vascular;  Laterality: Right;  . COLONOSCOPY    . CORONARY ANGIOPLASTY WITH STENT PLACEMENT  07/2009   3 stents  . ELBOW SURGERY     LEFT  . ENDARTERECTOMY Right 06/06/2016   Procedure: RIGHT CAROTID ENDARTERECTOMY WITH PATCH ANGIOPLASTY;  Surgeon: Serafina Mitchell, MD;  Location: [facility];  Service: Vascular;  Laterality: Right;  . EP IMPLANTABLE DEVICE N/A 05/16/2015   Procedure: Loop Recorder Insertion;  Surgeon: Thompson Grayer, MD;  Location: [facility] [facility];  Service: Cardiovascular;  Laterality: N/A;  .  ESOPHAGOSCOPY WITH DILITATION    . FISTULA SUPERFICIALIZATION Right 12/05/2016   Procedure: Superficialization and Ligation of Branches OF RIGHT ARM RADIOCEPHALIC FISTULA;  Surgeon: Serafina Mitchell, MD;  Location[facility];  Service: Vascular;  Laterality: Right;  . insulin pump needs to be removed for mri    . NOSE SURGERY    . stents    . TEE WITHOUT CARDIOVERSION N/A 05/16/2015   Procedure: TRANSESOPHAGEAL ECHOCARDIOGRAM (TEE);  Surgeon: Thayer Headings, MD;  Location: [facility];  Service: Cardiovascular;  Laterality: N/A;    Allergies  Allergen Reactions  . Lisinopril Other (See Comments)    "made me lose my voice"    Current Outpatient Medications  Medication Sig Dispense Refill  . albuterol  (PROVENTIL HFA;VENTOLIN HFA) 108 (90 Base) MCG/ACT inhaler Inhale 2 puffs into the lungs every 6 (six) hours as needed for wheezing or shortness of breath.    [name] aspirin 325 MG EC tablet Take 325 mg by mouth daily.    . calcitRIOL (ROCALTROL) 0.25 MCG capsule Take 0.25 mcg by mouth. Monday, Wednesday and Friday.    . calcium acetate (PHOSLO) 667 MG capsule TAKE 1 (ONE) TABLET THREE TIMES DAILY WITH MEALS  5  . clopidogrel (PLAVIX) 75 MG tablet Take 1 tablet (75 mg total) by mouth daily. 30 tablet 1  . ezetimibe (ZETIA) 10 MG tablet Take 1 tablet (10 mg total) by mouth daily. 30 tablet 1  . fexofenadine (ALLEGRA) 180 MG tablet Take 180 mg by mouth daily.    . fluticasone furoate-vilanterol (BREO ELLIPTA) 100-25 MCG/INH AEPB Inhale 1 puff into the lungs daily as needed (congestion).    . furosemide (LASIX) 40 MG tablet Take 40 mg by mouth 2 (two) times daily.    . hydrALAZINE (APRESOLINE) 100 MG tablet Take 100 mg by mouth 2 (two) times daily.     . insulin degludec (TRESIBA FLEXTOUCH) 100 UNIT/ML SOPN FlexTouch Pen Inject 25 Units into the skin daily at 10 pm.    . levothyroxine (SYNTHROID, LEVOTHROID) 50 MCG tablet Take 50 mcg by mouth daily before breakfast.     . metoprolol succinate (TOPROL-XL) 50 MG 24 hr tablet Take 50 mg by mouth 2 (two) times daily.     . Multiple Vitamins-Minerals (MULTIVITAMIN WITH MINERALS) tablet Take 1 tablet by mouth daily.    . pantoprazole (PROTONIX) 40 MG tablet Take 40 mg by mouth daily.    . sertraline (ZOLOFT) 25 MG tablet Take 25 mg by mouth daily.    . simvastatin (ZOCOR) 40 MG tablet Take 1 tablet (40 mg total) by mouth daily at 6 PM. (Patient taking differently: Take 40 mg by mouth every evening. ) 30 tablet 0  . sodium bicarbonate 650 MG tablet Take 650 mg by mouth 3 (three) times daily.  5   No current facility-administered medications for this visit.    Facility-Administered Medications Ordered in Other Visits  Medication Dose Route Frequency Provider  Last Rate Last Dose  . epoetin alfa (EPOGEN,PROCRIT) 32202 UNIT/ML injection           . epoetin alfa (EPOGEN,PROCRIT) injection 20,000 Units  20,000 Units Subcutaneous Q28 days Estanislado Emms, MD   20,000 Units at 06/20/17 [phone]     REVIEW OF SYSTEMS: see HPI for pertinent positives and negatives    PHYSICAL EXAMINATION:  Vitals:   06/20/17 1138  BP: (!) 143/65  Pulse: (!) 56  Resp: 18  Temp: (!) 97.1 F (36.2 C)  TempSrc: Oral  SpO2: 98%  Weight: 171 lb (77.6 kg)  Height: 5\' 8"  (1.727 m)   Body mass index is 26 kg/m.  General: The patient appears his stated age.   HEENT:  No gross abnormalities Pulmonary: Respirations are non-labored, CTAB Abdomen: Soft and non-tender with normal bowel sounds Musculoskeletal: There are no major deformities. Right hand with 2+ non pitting edema, 1+ in right arm, see photo below Neurologic: Hyperesthesia right hand, right hand grasp 2-3/5 Skin: There are no ulcer or rashes noted. Psychiatric: The patient has normal affect. Cardiovascular: There is a regular rate and rhythm without significant murmur appreciated.  Right radial pulse is 1+ palpable, left is 2+ palpable. Right forearm AV fistula has a palpable thrill and audible bruit. Right hand and arm skin feel warm and normal.       DATA  Dialysis Access Duplex (06/20/17):  Findings: +--------------------+----------+-----------------+----------------------------+ AVF         PSV (cm/s)Flow Vol (mL/min)     Comments      +--------------------+----------+-----------------+----------------------------+ Native artery inflow  223            AVF prox anastamosis is                            located in the mid-distal                               forearm       +--------------------+----------+-----------------+----------------------------+ AVF Anastomosis     103       254                   +--------------------+----------+-----------------+----------------------------+   +------------+----------+-------------+----------+--------------------+ OUTFLOW VEINPSV (cm/s)Diameter (cm)Depth (cm)   Describe    +------------+----------+-------------+----------+--------------------+ AC Fossa    66    0.47     0.28  velocities <100 cm/s +------------+----------+-------------+----------+--------------------+ Prox Forearm  81    0.48     0.37  velocities <100 cm/s +------------+----------+-------------+----------+--------------------+ Mid Forearm   829    0.50     0.51     stenotic    +------------+----------+-------------+----------+--------------------+ Dist Forearm  565    0.23     0.51     stenotic    +------------+----------+-------------+----------+--------------------+   Final Interpretation Arteriovenous fistula-Velocities less than 100cm/s noted. Arteriovenous fistula-Stenosis noted in two segements the mid and the mid-distal outflow vein. 81/829 cm/s = ratio 10.2 (Mid outflow vein) 164/565 cm/s= ratio 3.4 (mid-distal forearm outflow vein, just distal to anastamosis)    Medical Decision Making  KSHAWN CANAL is a 82 y.o. male who is s/p right radio-cephalic fistula creation (07-25-16 by Dr. Trula Slade) as well as elevation and branch ligation (12-05-16 by Dr. Trula Slade).  Pt has not yet started dialysis.   He has a 4 days hx of right hand and arm swelling, states he may have slept on his right arm.  He is unable to grasp adequately in his right hand to use his right hand, he is right hand dominant.  Right radial pulse is 1+ palpable. 2-3/5 right hand grasp strength.   I discussed with Dr. Donzetta Matters results of right arm AV fistula duplex: 829 cm/s area of stenosis and 565 cm/s area of stenosis. Dr. Donzetta Matters spoke with pt and daughter and examined  pt. Arteriogram is not a good option for this pt as the contrast will likely cause renal failure, he has stage 4-5 CKD.   I advised right hand elevation above  heart as much as possible, avoid sleeping on right arm; will schedule pt to see Dr. Trula Slade in the office on May 6 to discuss options to address the stenosis in his AVF and hand swelling; hopefully the elevation will improve his hand swelling. I advised him to call the answering service this weekend if he feels his right hand swelling is becoming worse, or have his daughter take him to [facility] ED.    Clemon Chambers, RN, MSN, FNP-C [facility] Office: [phone]  06/20/2017, 11:50 AM  Clinic MD: Donzetta Matters

## 2017-06-20 NOTE — Telephone Encounter (Signed)
LMOVM requesting manual transmission from patient's Carelink monitor.  [facility] phone number for questions/concerns.

## 2017-06-23 ENCOUNTER — Encounter: Payer: Self-pay | Admitting: Surgery

## 2017-06-23 ENCOUNTER — Ambulatory Visit (INDEPENDENT_AMBULATORY_CARE_PROVIDER_SITE_OTHER): Payer: Medicare Other | Admitting: Surgery

## 2017-06-23 ENCOUNTER — Other Ambulatory Visit: Payer: Self-pay | Admitting: *Deleted

## 2017-06-23 ENCOUNTER — Encounter: Payer: Self-pay | Admitting: *Deleted

## 2017-06-23 VITALS — BP 172/65 | HR 54 | Temp 97.0°F | Resp 17 | Ht 68.0 in | Wt 167.1 lb

## 2017-06-23 DIAGNOSIS — N184 Chronic kidney disease, stage 4 (severe): Secondary | ICD-10-CM

## 2017-06-23 NOTE — Progress Notes (Signed)
[facility]  Patient name: Robert [name] MRN: [id number] DOB: 23-Sep-1935 Sex: male   REASON FOR VISIT:    Follow up  HISOTRY OF PRESENT ILLNESS:    Robert [name] a 82 y.o.male, who Presented to the hospital on 05/20/2016 with slurred speech. He was also felt to have a left facial droop. The patient does have a history of a prior stroke with similar symptoms.  On 06/14/2016, he underwent right carotid endarterectomy with patch angioplasty.   He has also undergone right radio-cephalic fistula creation as well as elevation and branch ligation.  He developed severe swelling in his right arm approximately a week ago.  He has been try to keep this elevated.  It remains very painful and swollen.  Duplex suggested possible mid outflow tract stenosis   PAST MEDICAL HISTORY:   Past Medical History:  Diagnosis Date  . Allergy    year round  . Anemia   . Cancer (HCC)    PSA ELEVATED  . Carotid artery occlusion   . Cataract   . Chronic kidney disease    DR MATTINGLY   . Depression   . Diabetes mellitus   . Dyspnea   . GERD (gastroesophageal reflux disease)   . Hearing deficit   . History of kidney stones   . Hyperlipidemia   . Hypertension   . Myocardial infarction ([place]) 2011  . Stroke (cerebrum) (HCC)    weakness in left hand  04/2015, 05/20/16  . Thyroid disease    hypothyroid     FAMILY HISTORY:   Family History  Problem Relation Age of Onset  . Dementia Maternal Grandfather     SOCIAL HISTORY:   Social History   Tobacco Use  . Smoking status: Former Research scientist (life sciences)  . Smokeless tobacco: Never Used  Substance Use Topics  . Alcohol use: Yes    Alcohol/week: 1.2 oz    Types: 2 Glasses of wine per week    Comment: rarely     ALLERGIES:   Allergies  Allergen Reactions  . Lisinopril Other (See Comments)    "made me lose my voice"     CURRENT MEDICATIONS:   Current Outpatient  Medications  Medication Sig Dispense Refill  . albuterol (PROVENTIL HFA;VENTOLIN HFA) 108 (90 Base) MCG/ACT inhaler Inhale 2 puffs into the lungs every 6 (six) hours as needed for wheezing or shortness of breath.    [name] aspirin 325 MG EC tablet Take 325 mg by mouth daily.    . calcitRIOL (ROCALTROL) 0.25 MCG capsule Take 0.25 mcg by mouth. Monday, Wednesday and Friday.    . calcium acetate (PHOSLO) 667 MG capsule TAKE 1 (ONE) TABLET THREE TIMES DAILY WITH MEALS  5  . clopidogrel (PLAVIX) 75 MG tablet Take 1 tablet (75 mg total) by mouth daily. 30 tablet 1  . ezetimibe (ZETIA) 10 MG tablet Take 1 tablet (10 mg total) by mouth daily. 30 tablet 1  . fexofenadine (ALLEGRA) 180 MG tablet Take 180 mg by mouth daily.    . fluticasone furoate-vilanterol (BREO ELLIPTA) 100-25 MCG/INH AEPB Inhale 1 puff into the lungs daily as needed (congestion).    . furosemide (LASIX) 40 MG tablet Take 40 mg by mouth 2 (two) times daily.    . hydrALAZINE (APRESOLINE) 100 MG tablet Take 100 mg by mouth 2 (two) times daily.     . insulin degludec (TRESIBA FLEXTOUCH) 100 UNIT/ML SOPN FlexTouch Pen Inject 25 Units into the skin daily at 10 pm.    .  levothyroxine (SYNTHROID, LEVOTHROID) 50 MCG tablet Take 50 mcg by mouth daily before breakfast.     . metoprolol succinate (TOPROL-XL) 50 MG 24 hr tablet Take 50 mg by mouth 2 (two) times daily.     . Multiple Vitamins-Minerals (MULTIVITAMIN WITH MINERALS) tablet Take 1 tablet by mouth daily.    . pantoprazole (PROTONIX) 40 MG tablet Take 40 mg by mouth daily.    . sertraline (ZOLOFT) 25 MG tablet Take 25 mg by mouth daily.    . simvastatin (ZOCOR) 40 MG tablet Take 1 tablet (40 mg total) by mouth daily at 6 PM. (Patient taking differently: Take 40 mg by mouth every evening. ) 30 tablet 0  . sodium bicarbonate 650 MG tablet Take 650 mg by mouth 3 (three) times daily.  5   No current facility-administered medications for this visit.     REVIEW OF SYSTEMS:   [X]  denotes  positive finding, [ ]  denotes negative finding Cardiac  Comments:  Chest pain or chest pressure:    Shortness of breath upon exertion:    Short of breath when lying flat:    Irregular heart rhythm:        Vascular    Pain in calf, thigh, or hip brought on by ambulation:    Pain in feet at night that wakes you up from your sleep:     Blood clot in your veins:    Leg swelling:         Pulmonary    Oxygen at home:    Productive cough:     Wheezing:         Neurologic    Sudden weakness in arms or legs:     Sudden numbness in arms or legs:     Sudden onset of difficulty speaking or slurred speech:    Temporary loss of vision in one eye:     Problems with dizziness:         Gastrointestinal    Blood in stool:     Vomited blood:         Genitourinary    Burning when urinating:     Blood in urine:        Psychiatric    [name] depression:         Hematologic    Bleeding problems:    Problems with blood clotting too easily:        Skin    Rashes or ulcers:        Constitutional    Fever or chills:      PHYSICAL EXAM:   Vitals:   06/23/17 1528  BP: (!) 172/65  Pulse: (!) 54  Resp: 17  Temp: (!) 97 F (36.1 C)  TempSrc: Oral  SpO2: 98%  Weight: 167 lb 1.6 oz (75.8 kg)  Height: 5\' 8"  (1.727 m)    GENERAL: The patient is a well-nourished male, in no acute distress. The vital signs are documented above. CARDIAC: There is a regular rate and rhythm.  VASCULAR: Palpable thrill within right radiocephalic fistula with significant edema in the right arm from the elbow distal PULMONARY: Non-labored respirations MUSCULOSKELETAL: There are no [name] deformities or cyanosis. NEUROLOGIC: No focal weakness or paresthesias are detected. SKIN: There are no ulcers or rashes noted. PSYCHIATRIC: The patient has a normal affect.  STUDIES:   I have reviewed his fistula duplex which shows possible stenosis in the mid forearm  MEDICAL ISSUES:   We have discussed proceeding  with a right arm  fistulogram.  I will plan on cannulating the vein near the arteriovenous anastomosis.  The patient is not yet on dialysis and therefore minimal dye will be used.  He understands that even with minimal use of contrast it could exacerbate his renal failure.    Robert Major, MD [facility] [phone] Pager [phone]

## 2017-06-23 NOTE — H&P (View-Only) (Signed)
[facility]  Patient name: Robert [name] MRN: [id number] DOB: [birth year] Sex: male   REASON FOR VISIT:    Follow up  HISOTRY OF PRESENT ILLNESS:    Robert [name] a 82 y.o.male, who Presented to the hospital on 05/20/2016 with slurred speech. He was also felt to have a left facial droop. The patient does have a history of a prior stroke with similar symptoms.  On 06/14/2016, he underwent right carotid endarterectomy with patch angioplasty.   He has also undergone right radio-cephalic fistula creation as well as elevation and branch ligation.  He developed severe swelling in his right arm approximately a week ago.  He has been try to keep this elevated.  It remains very painful and swollen.  Duplex suggested possible mid outflow tract stenosis   PAST MEDICAL HISTORY:   Past Medical History:  Diagnosis Date  . Allergy    year round  . Anemia   . Cancer (HCC)    PSA ELEVATED  . Carotid artery occlusion   . Cataract   . Chronic kidney disease    DR MATTINGLY   . Depression   . Diabetes mellitus   . Dyspnea   . GERD (gastroesophageal reflux disease)   . Hearing deficit   . History of kidney stones   . Hyperlipidemia   . Hypertension   . Myocardial infarction ([place]) 2011  . Stroke (cerebrum) (HCC)    weakness in left hand  04/2015, 05/20/16  . Thyroid disease    hypothyroid     FAMILY HISTORY:   Family History  Problem Relation Age of Onset  . Dementia Maternal Grandfather     SOCIAL HISTORY:   Social History   Tobacco Use  . Smoking status: Former Research scientist (life sciences)  . Smokeless tobacco: Never Used  Substance Use Topics  . Alcohol use: Yes    Alcohol/week: 1.2 oz    Types: 2 Glasses of wine per week    Comment: rarely     ALLERGIES:   Allergies  Allergen Reactions  . Lisinopril Other (See Comments)    "made me lose my voice"     CURRENT MEDICATIONS:   Current Outpatient  Medications  Medication Sig Dispense Refill  . albuterol (PROVENTIL HFA;VENTOLIN HFA) 108 (90 Base) MCG/ACT inhaler Inhale 2 puffs into the lungs every 6 (six) hours as needed for wheezing or shortness of breath.    [name] aspirin 325 MG EC tablet Take 325 mg by mouth daily.    . calcitRIOL (ROCALTROL) 0.25 MCG capsule Take 0.25 mcg by mouth. Monday, Wednesday and Friday.    . calcium acetate (PHOSLO) 667 MG capsule TAKE 1 (ONE) TABLET THREE TIMES DAILY WITH MEALS  5  . clopidogrel (PLAVIX) 75 MG tablet Take 1 tablet (75 mg total) by mouth daily. 30 tablet 1  . ezetimibe (ZETIA) 10 MG tablet Take 1 tablet (10 mg total) by mouth daily. 30 tablet 1  . fexofenadine (ALLEGRA) 180 MG tablet Take 180 mg by mouth daily.    . fluticasone furoate-vilanterol (BREO ELLIPTA) 100-25 MCG/INH AEPB Inhale 1 puff into the lungs daily as needed (congestion).    . furosemide (LASIX) 40 MG tablet Take 40 mg by mouth 2 (two) times daily.    . hydrALAZINE (APRESOLINE) 100 MG tablet Take 100 mg by mouth 2 (two) times daily.     . insulin degludec (TRESIBA FLEXTOUCH) 100 UNIT/ML SOPN FlexTouch Pen Inject 25 Units into the skin daily at 10 pm.    .  levothyroxine (SYNTHROID, LEVOTHROID) 50 MCG tablet Take 50 mcg by mouth daily before breakfast.     . metoprolol succinate (TOPROL-XL) 50 MG 24 hr tablet Take 50 mg by mouth 2 (two) times daily.     . Multiple Vitamins-Minerals (MULTIVITAMIN WITH MINERALS) tablet Take 1 tablet by mouth daily.    . pantoprazole (PROTONIX) 40 MG tablet Take 40 mg by mouth daily.    . sertraline (ZOLOFT) 25 MG tablet Take 25 mg by mouth daily.    . simvastatin (ZOCOR) 40 MG tablet Take 1 tablet (40 mg total) by mouth daily at 6 PM. (Patient taking differently: Take 40 mg by mouth every evening. ) 30 tablet 0  . sodium bicarbonate 650 MG tablet Take 650 mg by mouth 3 (three) times daily.  5   No current facility-administered medications for this visit.     REVIEW OF SYSTEMS:   [X]  denotes  positive finding, [ ]  denotes negative finding Cardiac  Comments:  Chest pain or chest pressure:    Shortness of breath upon exertion:    Short of breath when lying flat:    Irregular heart rhythm:        Vascular    Pain in calf, thigh, or hip brought on by ambulation:    Pain in feet at night that wakes you up from your sleep:     Blood clot in your veins:    Leg swelling:         Pulmonary    Oxygen at home:    Productive cough:     Wheezing:         Neurologic    Sudden weakness in arms or legs:     Sudden numbness in arms or legs:     Sudden onset of difficulty speaking or slurred speech:    Temporary loss of vision in one eye:     Problems with dizziness:         Gastrointestinal    Blood in stool:     Vomited blood:         Genitourinary    Burning when urinating:     Blood in urine:        Psychiatric    Major depression:         Hematologic    Bleeding problems:    Problems with blood clotting too easily:        Skin    Rashes or ulcers:        Constitutional    Fever or chills:      PHYSICAL EXAM:   Vitals:   06/23/17 1528  BP: (!) 172/65  Pulse: (!) 54  Resp: 17  Temp: (!) 97 F (36.1 C)  TempSrc: Oral  SpO2: 98%  Weight: 167 lb 1.6 oz (75.8 kg)  Height: 5\' 8"  (1.727 m)    GENERAL: The patient is a well-nourished male, in no acute distress. The vital signs are documented above. CARDIAC: There is a regular rate and rhythm.  VASCULAR: Palpable thrill within right radiocephalic fistula with significant edema in the right arm from the elbow distal PULMONARY: Non-labored respirations MUSCULOSKELETAL: There are no major deformities or cyanosis. NEUROLOGIC: No focal weakness or paresthesias are detected. SKIN: There are no ulcers or rashes noted. PSYCHIATRIC: The patient has a normal affect.  STUDIES:   I have reviewed his fistula duplex which shows possible stenosis in the mid forearm  MEDICAL ISSUES:   We have discussed proceeding  with a right arm  fistulogram.  I will plan on cannulating the vein near the arteriovenous anastomosis.  The patient is not yet on dialysis and therefore minimal dye will be used.  He understands that even with minimal use of contrast it could exacerbate his renal failure.    [name], MD [facility] [phone] Pager [phone]

## 2017-06-23 NOTE — Progress Notes (Signed)
Instructed to be at [facility] admitting department at [place] on 06/24/17 for procedure all other instructions per letter. Verbalized understanding. Daughter with patient.

## 2017-06-24 ENCOUNTER — Encounter (HOSPITAL_COMMUNITY)
Admission: RE | Disposition: A | Discharge status: Home / Self Care | Payer: Self-pay | Source: Ambulatory Visit | Attending: Surgery

## 2017-06-24 ENCOUNTER — Encounter (HOSPITAL_COMMUNITY): Payer: Self-pay | Admitting: Surgery

## 2017-06-24 ENCOUNTER — Ambulatory Visit (HOSPITAL_COMMUNITY)
Admission: RE | Admit: 2017-06-24 | Discharge: 2017-06-24 | Disposition: A | Discharge status: Home / Self Care | Payer: Medicare Other | Source: Ambulatory Visit | Attending: Surgery | Admitting: Surgery

## 2017-06-24 DIAGNOSIS — Z859 Personal history of malignant neoplasm, unspecified: Secondary | ICD-10-CM | POA: Insufficient documentation

## 2017-06-24 DIAGNOSIS — H269 Unspecified cataract: Secondary | ICD-10-CM | POA: Insufficient documentation

## 2017-06-24 DIAGNOSIS — Z818 Family history of other mental and behavioral disorders: Secondary | ICD-10-CM | POA: Insufficient documentation

## 2017-06-24 DIAGNOSIS — F329 Major depressive disorder, single episode, unspecified: Secondary | ICD-10-CM | POA: Insufficient documentation

## 2017-06-24 DIAGNOSIS — Z87891 Personal history of nicotine dependence: Secondary | ICD-10-CM | POA: Diagnosis not present

## 2017-06-24 DIAGNOSIS — K219 Gastro-esophageal reflux disease without esophagitis: Secondary | ICD-10-CM | POA: Diagnosis not present

## 2017-06-24 DIAGNOSIS — Z79890 Hormone replacement therapy (postmenopausal): Secondary | ICD-10-CM | POA: Insufficient documentation

## 2017-06-24 DIAGNOSIS — T82858A Stenosis of vascular prosthetic devices, implants and grafts, initial encounter: Secondary | ICD-10-CM | POA: Diagnosis present

## 2017-06-24 DIAGNOSIS — Z79899 Other long term (current) drug therapy: Secondary | ICD-10-CM | POA: Insufficient documentation

## 2017-06-24 DIAGNOSIS — I129 Hypertensive chronic kidney disease with stage 1 through stage 4 chronic kidney disease, or unspecified chronic kidney disease: Secondary | ICD-10-CM | POA: Diagnosis not present

## 2017-06-24 DIAGNOSIS — I252 Old myocardial infarction: Secondary | ICD-10-CM | POA: Insufficient documentation

## 2017-06-24 DIAGNOSIS — Z7901 Long term (current) use of anticoagulants: Secondary | ICD-10-CM | POA: Insufficient documentation

## 2017-06-24 DIAGNOSIS — Z8673 Personal history of transient ischemic attack (TIA), and cerebral infarction without residual deficits: Secondary | ICD-10-CM | POA: Diagnosis not present

## 2017-06-24 DIAGNOSIS — E039 Hypothyroidism, unspecified: Secondary | ICD-10-CM | POA: Diagnosis not present

## 2017-06-24 DIAGNOSIS — N189 Chronic kidney disease, unspecified: Secondary | ICD-10-CM | POA: Insufficient documentation

## 2017-06-24 DIAGNOSIS — E1122 Type 2 diabetes mellitus with diabetic chronic kidney disease: Secondary | ICD-10-CM | POA: Insufficient documentation

## 2017-06-24 DIAGNOSIS — Z87442 Personal history of urinary calculi: Secondary | ICD-10-CM | POA: Insufficient documentation

## 2017-06-24 DIAGNOSIS — E785 Hyperlipidemia, unspecified: Secondary | ICD-10-CM | POA: Insufficient documentation

## 2017-06-24 DIAGNOSIS — Z888 Allergy status to other drugs, medicaments and biological substances status: Secondary | ICD-10-CM | POA: Diagnosis not present

## 2017-06-24 DIAGNOSIS — I6529 Occlusion and stenosis of unspecified carotid artery: Secondary | ICD-10-CM | POA: Insufficient documentation

## 2017-06-24 DIAGNOSIS — Z7982 Long term (current) use of aspirin: Secondary | ICD-10-CM | POA: Insufficient documentation

## 2017-06-24 DIAGNOSIS — Z9889 Other specified postprocedural states: Secondary | ICD-10-CM | POA: Diagnosis not present

## 2017-06-24 DIAGNOSIS — N185 Chronic kidney disease, stage 5: Secondary | ICD-10-CM | POA: Diagnosis not present

## 2017-06-24 DIAGNOSIS — Y832 Surgical operation with anastomosis, bypass or graft as the cause of abnormal reaction of the patient, or of later complication, without mention of misadventure at the time of the procedure: Secondary | ICD-10-CM | POA: Diagnosis not present

## 2017-06-24 DIAGNOSIS — R600 Localized edema: Secondary | ICD-10-CM | POA: Diagnosis not present

## 2017-06-24 DIAGNOSIS — R2231 Localized swelling, mass and lump, right upper limb: Secondary | ICD-10-CM | POA: Diagnosis not present

## 2017-06-24 HISTORY — PX: PERIPHERAL VASCULAR BALLOON ANGIOPLASTY: CATH118281

## 2017-06-24 HISTORY — PX: A/V FISTULAGRAM: CATH118298

## 2017-06-24 LAB — POCT I-STAT, CHEM 8
BUN: 80 mg/dL — AB (ref 6–20)
CREATININE: 5.9 mg/dL — AB (ref 0.61–1.24)
Calcium, Ion: 1.13 mmol/L — ABNORMAL LOW (ref 1.15–1.40)
Chloride: 108 mmol/L (ref 101–111)
GLUCOSE: 110 mg/dL — AB (ref 65–99)
HEMATOCRIT: 29 % — AB (ref 39.0–52.0)
HEMOGLOBIN: 9.9 g/dL — AB (ref 13.0–17.0)
Potassium: 3.3 mmol/L — ABNORMAL LOW (ref 3.5–5.1)
Sodium: 143 mmol/L (ref 135–145)
TCO2: 20 mmol/L — AB (ref 22–32)

## 2017-06-24 SURGERY — A/V FISTULAGRAM
Anesthesia: LOCAL | Laterality: Right

## 2017-06-24 MED ORDER — HEPARIN (PORCINE) IN NACL 1000-0.9 UT/500ML-% IV SOLN
INTRAVENOUS | Status: AC
  Filled 2017-06-24: qty 500

## 2017-06-24 MED ORDER — LIDOCAINE HCL (PF) 1 % IJ SOLN
INTRAMUSCULAR | Status: DC | PRN
  Administered 2017-06-24: 3 mL

## 2017-06-24 MED ORDER — IODIXANOL 320 MG/ML IV SOLN
INTRAVENOUS | Status: DC | PRN
  Administered 2017-06-24: 6 mL via INTRAVENOUS

## 2017-06-24 MED ORDER — SODIUM CHLORIDE 0.9 % IV SOLN: 250.0000 mL | INTRAVENOUS | Status: DC | PRN

## 2017-06-24 MED ORDER — SODIUM CHLORIDE 0.9 % IV SOLN: INTRAVENOUS | Status: DC

## 2017-06-24 MED ORDER — LIDOCAINE HCL (PF) 1 % IJ SOLN
INTRAMUSCULAR | Status: AC
  Filled 2017-06-24: qty 30

## 2017-06-24 MED ORDER — FENTANYL CITRATE (PF) 100 MCG/2ML IJ SOLN
INTRAMUSCULAR | Status: AC
  Filled 2017-06-24: qty 2

## 2017-06-24 MED ORDER — SODIUM CHLORIDE 0.9% FLUSH: 3.0000 mL | Freq: Two times a day (BID) | INTRAVENOUS | Status: DC

## 2017-06-24 MED ORDER — HEPARIN (PORCINE) IN NACL 2-0.9 UNITS/ML
INTRAMUSCULAR | Status: AC | PRN
  Administered 2017-06-24: 500 mL

## 2017-06-24 MED ORDER — ACETAMINOPHEN 325 MG PO TABS: 650.0000 mg | ORAL_TABLET | ORAL | Status: DC | PRN

## 2017-06-24 MED ORDER — ONDANSETRON HCL 4 MG/2ML IJ SOLN: 4.0000 mg | Freq: Four times a day (QID) | INTRAMUSCULAR | Status: DC | PRN

## 2017-06-24 MED ORDER — LABETALOL HCL 5 MG/ML IV SOLN: 10.0000 mg | INTRAVENOUS | Status: DC | PRN

## 2017-06-24 MED ORDER — HYDRALAZINE HCL 20 MG/ML IJ SOLN: 5.0000 mg | INTRAMUSCULAR | Status: DC | PRN

## 2017-06-24 MED ORDER — MIDAZOLAM HCL 2 MG/2ML IJ SOLN
INTRAMUSCULAR | Status: AC
  Filled 2017-06-24: qty 2

## 2017-06-24 MED ORDER — MIDAZOLAM HCL 2 MG/2ML IJ SOLN
INTRAMUSCULAR | Status: DC | PRN
  Administered 2017-06-24: 1 mg via INTRAVENOUS

## 2017-06-24 MED ORDER — SODIUM CHLORIDE 0.9% FLUSH: 3.0000 mL | INTRAVENOUS | Status: DC | PRN

## 2017-06-24 MED ORDER — SODIUM CHLORIDE 0.9 % IV SOLN
INTRAVENOUS | Status: DC
Start: 2017-06-24 — End: 2017-06-24
  Administered 2017-06-24: 09:00:00 via INTRAVENOUS

## 2017-06-24 MED ORDER — FENTANYL CITRATE (PF) 100 MCG/2ML IJ SOLN
INTRAMUSCULAR | Status: DC | PRN
  Administered 2017-06-24: 25 ug via INTRAVENOUS

## 2017-06-24 SURGICAL SUPPLY — 16 items
BAG SNAP BAND KOVER 36X36 (MISCELLANEOUS) ×3 IMPLANT
BALLN STERLING OTW 5X40X135 (BALLOONS) ×3
BALLOON STERLING OTW 5X40X135 (BALLOONS) ×2 IMPLANT
COVER DOME SNAP 22 D (MISCELLANEOUS) ×3 IMPLANT
COVER PRB 48X5XTLSCP FOLD TPE (BAG) ×2 IMPLANT
COVER PROBE 5X48 (BAG) ×1
KIT ENCORE 26 ADVANTAGE (KITS) ×3 IMPLANT
KIT MICROPUNCTURE NIT STIFF (SHEATH) ×3 IMPLANT
PROTECTION STATION PRESSURIZED (MISCELLANEOUS) ×3
SHEATH PINNACLE R/O II 5F 6CM (SHEATH) ×3 IMPLANT
STATION PROTECTION PRESSURIZED (MISCELLANEOUS) ×2 IMPLANT
STOPCOCK MORSE 400PSI 3WAY (MISCELLANEOUS) ×3 IMPLANT
TRAY PV CATH (CUSTOM PROCEDURE TRAY) ×3 IMPLANT
TUBING CIL FLEX 10 FLL-RA (TUBING) ×3 IMPLANT
WIRE BENTSON .035X145CM (WIRE) ×3 IMPLANT
WIRE SPARTACORE .014X190CM (WIRE) ×3 IMPLANT

## 2017-06-24 NOTE — Interval H&P Note (Signed)
History and Physical Interval Note:  06/24/2017 9:43 AM  Robert [name]  has presented today for surgery, with the diagnosis of complication w/fistula  The various methods of treatment have been discussed with the patient and family. After consideration of risks, benefits and other options for treatment, the patient has consented to  Procedure(s): A/V FISTULAGRAM (Right) as a surgical intervention .  The patient's history has been reviewed, patient examined, no change in status, stable for surgery.  I have reviewed the patient's chart and labs.  Questions were answered to the patient's satisfaction.     [name]

## 2017-06-24 NOTE — Telephone Encounter (Signed)
LMOVM requesting manual transmission from patient's Carelink monitor.  [facility] phone number for questions/concerns.

## 2017-06-24 NOTE — Discharge Instructions (Signed)

## 2017-06-24 NOTE — Op Note (Signed)
    Patient name: Robert [name] MRN: [id number] DOB: 1935/09/15 Sex: male  06/24/2017 Pre-operative Diagnosis: right arm edema Post-operative diagnosis:  Same Surgeon:  [name] Procedure Performed:  1.  CKD  2.  Ultrasound-guided access, right radiocephalic fistula  3.  Angioplasty, right cephalic vein    4.  conscious sedation (23 minutes)     Indications: The patient has previously undergone a right radiocephalic fistula with elevation.  Last week he developed severe edema in his right arm.  Ultrasound identified a stenosis.  He is here today for treatment.  Procedure:  The patient was identified in the holding area and taken to room 8.  The patient was then placed supine on the table and prepped and draped in the usual sterile fashion.  A time out was called.  Conscious sedation was administered with the use of IV fentanyl and Versed under continuous physician and nurse monitoring.  Heart rate, blood pressure, and oxygen saturations were continuously monitored.  Ultrasound was used to evaluate the fistula.  The vein was patent and compressible.  A digital ultrasound image was acquired.  The fistula was then accessed under ultrasound guidance using a micropuncture needle.  An 018 wire was then asvanced without resistance and a micropuncture sheath was placed.  Contrast injections were then performed through the sheath.  Findings: 90% stenosis within the cephalic vein in the mid forearm.  The dominant outflow is the basilic vein in the upper arm.   Intervention: Over an 035 wire, a 5 French sheath was placed.  I selected a 5 x 40 Sterling balloon and performed balloon angioplasty of the area of stenosis.  The balloon was taken to nominal pressure for 2 minutes.  Follow-up imaging showed resolution of the stenosis.  Balloon and wire were removed and the sheath was withdrawn and manual pressure was held for hemostasis.  Impression:  #1  90% stenosis within the cephalic vein in the mid  forearm successfully treated using a 5 mm balloon  #2  Limited fistulogram given that the patient is not on dialysis.  A total of 6 cc of contrast were utilized.   Theotis Burrow, M.D. [facility] [place] Office: [phone] Pager:  [phone]

## 2017-06-25 ENCOUNTER — Telehealth: Payer: Self-pay | Admitting: Internal Medicine

## 2017-06-25 NOTE — Telephone Encounter (Signed)
Informed patient that manual transmission received. Patient verbalized understanding.  Printed and placed in ILR folder.

## 2017-06-25 NOTE — Telephone Encounter (Signed)
New Message ° ° ° °1. Has your device fired? no ° °2. Is you device beeping? no ° °3. Are you experiencing draining or swelling at device site? no ° °4. Are you calling to see if we received your device transmission? yes ° °5. Have you passed out? no ° ° ° °Please route to Device Clinic Pool ° °

## 2017-06-27 NOTE — Telephone Encounter (Signed)
Manual transmission received on 06/24/17.  ECGs appear SR w/PACs, not AF.

## 2017-07-03 ENCOUNTER — Other Ambulatory Visit: Payer: Self-pay | Admitting: Internal Medicine

## 2017-07-14 ENCOUNTER — Ambulatory Visit (INDEPENDENT_AMBULATORY_CARE_PROVIDER_SITE_OTHER): Payer: Medicare Other | Admitting: *Deleted

## 2017-07-14 DIAGNOSIS — I639 Cerebral infarction, unspecified: Secondary | ICD-10-CM

## 2017-07-14 LAB — CUP PACEART REMOTE DEVICE CHECK
Date Time Interrogation Session: 20190417013937
MDC IDC PG IMPLANT DT: 20170321

## 2017-07-14 NOTE — Progress Notes (Signed)
Carelink Summary Report / Loop Recorder 

## 2017-07-17 ENCOUNTER — Encounter (HOSPITAL_COMMUNITY)
Admission: RE | Admit: 2017-07-17 | Discharge: 2017-07-17 | Disposition: A | Discharge status: Home / Self Care | Payer: Medicare Other | Source: Ambulatory Visit | Attending: Nephrology | Admitting: Nephrology

## 2017-07-17 VITALS — BP 169/66 | HR 60 | Temp 98.2°F

## 2017-07-17 DIAGNOSIS — N185 Chronic kidney disease, stage 5: Secondary | ICD-10-CM | POA: Insufficient documentation

## 2017-07-17 DIAGNOSIS — N184 Chronic kidney disease, stage 4 (severe): Secondary | ICD-10-CM

## 2017-07-17 DIAGNOSIS — D631 Anemia in chronic kidney disease: Secondary | ICD-10-CM | POA: Diagnosis present

## 2017-07-17 LAB — IRON AND TIBC
IRON: 65 ug/dL (ref 45–182)
SATURATION RATIOS: 28 % (ref 17.9–39.5)
TIBC: 234 ug/dL — AB (ref 250–450)
UIBC: 169 ug/dL

## 2017-07-17 LAB — POCT HEMOGLOBIN-HEMACUE: HEMOGLOBIN: 10.7 g/dL — AB (ref 13.0–17.0)

## 2017-07-17 LAB — FERRITIN: Ferritin: 591 ng/mL — ABNORMAL HIGH (ref 24–336)

## 2017-07-17 MED ORDER — EPOETIN ALFA 20000 UNIT/ML IJ SOLN
20000.0000 [IU] | INTRAMUSCULAR | Status: DC
  Administered 2017-07-17: 20000 [IU] via SUBCUTANEOUS

## 2017-07-17 MED ORDER — EPOETIN ALFA 20000 UNIT/ML IJ SOLN
INTRAMUSCULAR | Status: AC
  Filled 2017-07-17: qty 1

## 2017-07-18 ENCOUNTER — Encounter (HOSPITAL_COMMUNITY): Payer: Medicare Other

## 2017-07-31 ENCOUNTER — Other Ambulatory Visit (HOSPITAL_COMMUNITY): Payer: Self-pay | Admitting: *Deleted

## 2017-08-01 ENCOUNTER — Encounter (HOSPITAL_COMMUNITY)
Admission: RE | Admit: 2017-08-01 | Discharge: 2017-08-01 | Disposition: A | Discharge status: Home / Self Care | Payer: Medicare Other | Source: Ambulatory Visit | Attending: Nephrology | Admitting: Nephrology

## 2017-08-01 VITALS — BP 145/65 | HR 60 | Resp 20

## 2017-08-01 DIAGNOSIS — D631 Anemia in chronic kidney disease: Secondary | ICD-10-CM | POA: Insufficient documentation

## 2017-08-01 DIAGNOSIS — N184 Chronic kidney disease, stage 4 (severe): Secondary | ICD-10-CM

## 2017-08-01 DIAGNOSIS — N185 Chronic kidney disease, stage 5: Secondary | ICD-10-CM | POA: Insufficient documentation

## 2017-08-01 LAB — POCT HEMOGLOBIN-HEMACUE: Hemoglobin: 10.7 g/dL — ABNORMAL LOW (ref 13.0–17.0)

## 2017-08-01 MED ORDER — EPOETIN ALFA 20000 UNIT/ML IJ SOLN
20000.0000 [IU] | INTRAMUSCULAR | Status: DC
  Administered 2017-08-01: 20000 [IU] via SUBCUTANEOUS

## 2017-08-01 MED ORDER — EPOETIN ALFA 20000 UNIT/ML IJ SOLN
INTRAMUSCULAR | Status: AC
  Administered 2017-08-01: 20000 [IU] via SUBCUTANEOUS
  Filled 2017-08-01: qty 1

## 2017-08-04 LAB — CUP PACEART REMOTE DEVICE CHECK
Date Time Interrogation Session: 20190520020714
Implantable Pulse Generator Implant Date: 20170321

## 2017-08-13 ENCOUNTER — Telehealth: Payer: Self-pay | Admitting: *Deleted

## 2017-08-13 NOTE — Telephone Encounter (Signed)
LMOVM requesting manual [facility] transmission for review.  [facility] phone number for questions/concerns.  Received alert for 4 "AF" episodes.  Available ECGs suggest SR w/PACs, but will review additional ECGs when manual transmission received.

## 2017-08-15 ENCOUNTER — Encounter (HOSPITAL_COMMUNITY)
Admission: RE | Admit: 2017-08-15 | Discharge: 2017-08-15 | Disposition: A | Discharge status: Home / Self Care | Payer: Medicare Other | Source: Ambulatory Visit | Attending: Nephrology | Admitting: Nephrology

## 2017-08-15 ENCOUNTER — Ambulatory Visit (INDEPENDENT_AMBULATORY_CARE_PROVIDER_SITE_OTHER): Payer: Medicare Other | Admitting: *Deleted

## 2017-08-15 VITALS — BP 158/66 | HR 64 | Temp 97.9°F | Resp 20

## 2017-08-15 DIAGNOSIS — D631 Anemia in chronic kidney disease: Secondary | ICD-10-CM

## 2017-08-15 DIAGNOSIS — N185 Chronic kidney disease, stage 5: Secondary | ICD-10-CM | POA: Diagnosis not present

## 2017-08-15 DIAGNOSIS — I639 Cerebral infarction, unspecified: Secondary | ICD-10-CM | POA: Diagnosis not present

## 2017-08-15 DIAGNOSIS — N184 Chronic kidney disease, stage 4 (severe): Principal | ICD-10-CM

## 2017-08-15 LAB — POCT HEMOGLOBIN-HEMACUE: Hemoglobin: 10.9 g/dL — ABNORMAL LOW (ref 13.0–17.0)

## 2017-08-15 LAB — IRON AND TIBC
IRON: 27 ug/dL — AB (ref 45–182)
SATURATION RATIOS: 13 % — AB (ref 17.9–39.5)
TIBC: 203 ug/dL — AB (ref 250–450)
UIBC: 176 ug/dL

## 2017-08-15 LAB — FERRITIN: FERRITIN: 596 ng/mL — AB (ref 24–336)

## 2017-08-15 MED ORDER — EPOETIN ALFA 20000 UNIT/ML IJ SOLN
INTRAMUSCULAR | Status: AC
  Administered 2017-08-15: 20000 [IU] via SUBCUTANEOUS
  Filled 2017-08-15: qty 1

## 2017-08-15 MED ORDER — EPOETIN ALFA 20000 UNIT/ML IJ SOLN
20000.0000 [IU] | INTRAMUSCULAR | Status: DC
  Administered 2017-08-15: 20000 [IU] via SUBCUTANEOUS

## 2017-08-15 NOTE — Progress Notes (Signed)
Carelink Summary Report / Loop Recorder 

## 2017-08-21 NOTE — Telephone Encounter (Signed)
LVMOM regarding manual transmission for review. Gave device clinic phone number to call back with questions or concerns.

## 2017-08-27 ENCOUNTER — Other Ambulatory Visit: Payer: Self-pay

## 2017-08-27 DIAGNOSIS — Z01812 Encounter for preprocedural laboratory examination: Secondary | ICD-10-CM

## 2017-08-27 DIAGNOSIS — N184 Chronic kidney disease, stage 4 (severe): Secondary | ICD-10-CM

## 2017-08-29 ENCOUNTER — Encounter (HOSPITAL_COMMUNITY): Payer: Medicare Other

## 2017-09-02 NOTE — Telephone Encounter (Signed)
Able to reach patient.  Requested manual transmission.  Patient verbalizes understanding and states he will send one.  Will review when transmission is received.

## 2017-09-03 NOTE — Telephone Encounter (Signed)
Manual transmission received on 09/03/17.  ECGs appear SR w/PACs, not AF.

## 2017-09-04 ENCOUNTER — Telehealth: Payer: Self-pay | Admitting: *Deleted

## 2017-09-04 NOTE — Telephone Encounter (Signed)
LMOVM (DPR) requesting manual Carelink transmission for review.  [facility] phone number for questions/concerns.  Received ILR alert for 4 "AF" episodes--available ECG appears SR w/PACs.  Will review additional ECGs when transmission received.

## 2017-09-09 NOTE — Telephone Encounter (Signed)
Spoke with patient to request manual Carelink transmission for review of "AF" episodes.  Available ECGs show SR w/ectopy.  Patient agrees to send manual transmission when he gets home from dialysis this evening.  Discussed possibility of sending manual weekly transmissions--patient is agreeable to this plan.  He is appreciative of call and denies additional questions or concerns at this time.

## 2017-09-12 NOTE — Telephone Encounter (Signed)
Spoke with patient to request manual Carelink transmission for review (still not received since 7/16).  Patient agrees to send today.  He denies questions or concerns at this time.

## 2017-09-15 ENCOUNTER — Ambulatory Visit (INDEPENDENT_AMBULATORY_CARE_PROVIDER_SITE_OTHER): Payer: Medicare Other | Admitting: *Deleted

## 2017-09-15 DIAGNOSIS — I639 Cerebral infarction, unspecified: Secondary | ICD-10-CM

## 2017-09-15 NOTE — Telephone Encounter (Signed)
Manual transmission received on 09/12/17.  ECGs printed and placed in Dr. Otilio Connors folder for review.

## 2017-09-18 ENCOUNTER — Other Ambulatory Visit: Payer: Self-pay | Admitting: Internal Medicine

## 2017-09-18 LAB — CUP PACEART REMOTE DEVICE CHECK
Date Time Interrogation Session: 20190622020825
MDC IDC PG IMPLANT DT: 20170321

## 2017-09-18 NOTE — Progress Notes (Signed)
Carelink Summary Report / Loop Recorder 

## 2017-09-29 ENCOUNTER — Encounter: Payer: Self-pay | Admitting: *Deleted

## 2017-09-29 ENCOUNTER — Encounter

## 2017-09-29 ENCOUNTER — Other Ambulatory Visit: Payer: Self-pay | Admitting: *Deleted

## 2017-09-29 ENCOUNTER — Encounter: Payer: Self-pay | Admitting: Surgery

## 2017-09-29 ENCOUNTER — Ambulatory Visit (HOSPITAL_COMMUNITY)
Admission: RE | Admit: 2017-09-29 | Discharge: 2017-09-29 | Disposition: A | Discharge status: Home / Self Care | Payer: Medicare Other | Source: Ambulatory Visit | Attending: Surgery | Admitting: Surgery

## 2017-09-29 ENCOUNTER — Other Ambulatory Visit: Payer: Self-pay

## 2017-09-29 ENCOUNTER — Ambulatory Visit (INDEPENDENT_AMBULATORY_CARE_PROVIDER_SITE_OTHER): Payer: Medicare Other | Admitting: Surgery

## 2017-09-29 ENCOUNTER — Ambulatory Visit (INDEPENDENT_AMBULATORY_CARE_PROVIDER_SITE_OTHER)
Admission: RE | Admit: 2017-09-29 | Discharge: 2017-09-29 | Disposition: A | Discharge status: Home / Self Care | Payer: Medicare Other | Source: Ambulatory Visit | Attending: Surgery | Admitting: Surgery

## 2017-09-29 VITALS — BP 146/64 | HR 58 | Temp 97.0°F | Resp 20 | Ht 68.0 in | Wt 166.1 lb

## 2017-09-29 DIAGNOSIS — Z992 Dependence on renal dialysis: Secondary | ICD-10-CM

## 2017-09-29 DIAGNOSIS — N186 End stage renal disease: Secondary | ICD-10-CM

## 2017-09-29 DIAGNOSIS — Z01812 Encounter for preprocedural laboratory examination: Secondary | ICD-10-CM

## 2017-09-29 DIAGNOSIS — N184 Chronic kidney disease, stage 4 (severe): Secondary | ICD-10-CM

## 2017-09-29 NOTE — Progress Notes (Signed)
[facility] [place]  Patient name: Robert [name] MRN: [id number] DOB: 1935/07/23 Sex: male   REASON FOR VISIT:    Dialysis access  HISOTRY OF PRESENT ILLNESS:    Robert [name] is a 82 y.o. male who is status post right carotid endarterectomy with patch angioplasty on 06/14/2016 for symptomatic stenosis.  I have also performed a right radiocephalic fistula as well as elevation and branch ligation.  He went on to have angioplasty of the stenosis.  This fistula is now no longer functional and he has a catheter in place.  He is here today to discuss other options.  He is right-handed   PAST MEDICAL HISTORY:   Past Medical History:  Diagnosis Date  . Allergy    year round  . Anemia   . Cancer (HCC)    PSA ELEVATED  . Carotid artery occlusion   . Cataract   . Chronic kidney disease    DR MATTINGLY   . Depression   . Diabetes mellitus   . Dyspnea   . GERD (gastroesophageal reflux disease)   . Hearing deficit   . History of kidney stones   . Hyperlipidemia   . Hypertension   . Myocardial infarction ([place]) 2011  . Stroke (cerebrum) (HCC)    weakness in left hand  04/2015, 05/20/16  . Thyroid disease    hypothyroid     FAMILY HISTORY:   Family History  Problem Relation Age of Onset  . Dementia Maternal Grandfather     SOCIAL HISTORY:   Social History   Tobacco Use  . Smoking status: Former Research scientist (life sciences)  . Smokeless tobacco: Never Used  Substance Use Topics  . Alcohol use: Yes    Alcohol/week: 1.2 oz    Types: 2 Glasses of wine per week    Comment: rarely     ALLERGIES:   Allergies  Allergen Reactions  . Lisinopril Other (See Comments)    "made me lose my voice"     CURRENT MEDICATIONS:   Current Outpatient Medications  Medication Sig Dispense Refill  . albuterol (PROVENTIL HFA;VENTOLIN HFA) 108 (90 Base) MCG/ACT inhaler Inhale 2 puffs into the lungs every 6 (six) hours as needed for wheezing  or shortness of breath.    [name] aspirin 325 MG EC tablet Take 325 mg by mouth daily.    . calcitRIOL (ROCALTROL) 0.25 MCG capsule Take 0.25 mcg by mouth. Given at Dialysis on Tues, Thurs, Sat.    . calcium acetate (PHOSLO) 667 MG capsule TAKE 1 (ONE) TABLET THREE TIMES DAILY WITH MEALS  5  . clopidogrel (PLAVIX) 75 MG tablet Take 1 tablet (75 mg total) by mouth daily. 30 tablet 1  . ezetimibe (ZETIA) 10 MG tablet Take 1 tablet (10 mg total) by mouth daily. 30 tablet 1  . fexofenadine (ALLEGRA) 180 MG tablet Take 180 mg by mouth daily.    . fluticasone furoate-vilanterol (BREO ELLIPTA) 100-25 MCG/INH AEPB Inhale 1 puff into the lungs daily as needed (congestion).    . hydrALAZINE (APRESOLINE) 100 MG tablet Take 100 mg by mouth 2 (two) times daily. Does not take in AM on Tues, Thurs, Sat.    . insulin degludec (TRESIBA FLEXTOUCH) 100 UNIT/ML SOPN FlexTouch Pen Inject 25 Units into the skin daily at 10 pm.    . levothyroxine (SYNTHROID, LEVOTHROID) 50 MCG tablet Take 50 mcg by mouth daily before breakfast.     . metoprolol succinate (TOPROL-XL) 50 MG 24 hr tablet Take 50 mg by  mouth 2 (two) times daily. Does not take in am on Tues, Thurs, Sat.    . Multiple Vitamins-Minerals (MULTIVITAMIN WITH MINERALS) tablet Take 1 tablet by mouth daily.    . pantoprazole (PROTONIX) 40 MG tablet Take 40 mg by mouth daily.    . sertraline (ZOLOFT) 25 MG tablet Take 25 mg by mouth daily.    . simvastatin (ZOCOR) 40 MG tablet Take 1 tablet (40 mg total) by mouth daily at 6 PM. (Patient taking differently: Take 40 mg by mouth every evening. ) 30 tablet 0  . sodium bicarbonate 650 MG tablet Take 650 mg by mouth 3 (three) times daily.  5   No current facility-administered medications for this visit.     REVIEW OF SYSTEMS:   [X]  denotes positive finding, [ ]  denotes negative finding Cardiac  Comments:  Chest pain or chest pressure:    Shortness of breath upon exertion:    Short of breath when lying flat:      Irregular heart rhythm:        Vascular    Pain in calf, thigh, or hip brought on by ambulation:    Pain in feet at night that wakes you up from your sleep:     Blood clot in your veins:    Leg swelling:         Pulmonary    Oxygen at home:    Productive cough:     Wheezing:         Neurologic    Sudden weakness in arms or legs:     Sudden numbness in arms or legs:     Sudden onset of difficulty speaking or slurred speech:    Temporary loss of vision in one eye:     Problems with dizziness:         Gastrointestinal    Blood in stool:     Vomited blood:         Genitourinary    Burning when urinating:     Blood in urine:        Psychiatric    Major depression:         Hematologic    Bleeding problems:    Problems with blood clotting too easily:        Skin    Rashes or ulcers:        Constitutional    Fever or chills:      PHYSICAL EXAM:   Vitals:   09/29/17 1101  BP: (!) 146/64  Pulse: (!) 58  Resp: 20  Temp: (!) 97 F (36.1 C)  TempSrc: Oral  SpO2: 97%  Weight: 166 lb 1.6 oz (75.3 kg)  Height: 5\' 8"  (1.727 m)    GENERAL: The patient is a well-nourished male, in no acute distress. The vital signs are documented above. CARDIAC: There is a regular rate and rhythm.  VASCULAR: Palpable brachial pulse bilaterally PULMONARY: Non-labored respirations MUSCULOSKELETAL: There are no major deformities or cyanosis. NEUROLOGIC: No focal weakness or paresthesias are detected. SKIN: There are no ulcers or rashes noted. PSYCHIATRIC: The patient has a normal affect.  STUDIES:   *+-----------------+-------------+----------+--------+ Left Cephalic  Diameter (cm)Depth (cm)Findings +-----------------+-------------+----------+--------+ Prox upper arm    0.11             +-----------------+-------------+----------+--------+ Mid upper arm    0.12             +-----------------+-------------+----------+--------+ Dist  upper arm    0.13             +-----------------+-------------+----------+--------+  Antecubital fossa  0.15             +-----------------+-------------+----------+--------+ Prox forearm     0.25             +-----------------+-------------+----------+--------+ Mid forearm     0.17             +-----------------+-------------+----------+--------+ Dist forearm     0.11             +-----------------+-------------+----------+--------+  +-----------------+-------------+----------+---------+ Left Basilic   Diameter (cm)Depth (cm)Findings  +-----------------+-------------+----------+---------+ Prox upper arm    0.35              +-----------------+-------------+----------+---------+ Mid upper arm    0.39              +-----------------+-------------+----------+---------+ Dist upper arm    0.29              +-----------------+-------------+----------+---------+ Antecubital fossa  0.26        branching +-----------------+-------------+----------+---------+ Prox forearm     0.23        branching +-----------------+-------------+----------+---------+**  MEDICAL ISSUES:   ESRD: We discussed proceeding with a staged basilic vein fistula.  The first stage is been scheduled for Wednesday, August 21.  I discussed the risk benefit of the procedure including the need for subsequent operations and the risk of not maturity.  He will stop his Plavix 5 days prior to the operation.    [name], MD [facility] [phone] Pager [phone]

## 2017-09-29 NOTE — H&P (View-Only) (Signed)
[facility] [place]  Patient name: Robert [name] MRN: [id number] DOB: January 12, 1936 Sex: male   REASON FOR VISIT:    Dialysis access  HISOTRY OF PRESENT ILLNESS:    Robert [name] is a 82 y.o. male who is status post right carotid endarterectomy with patch angioplasty on 06/14/2016 for symptomatic stenosis.  I have also performed a right radiocephalic fistula as well as elevation and branch ligation.  He went on to have angioplasty of the stenosis.  This fistula is now no longer functional and he has a catheter in place.  He is here today to discuss other options.  He is right-handed   PAST MEDICAL HISTORY:   Past Medical History:  Diagnosis Date  . Allergy    year round  . Anemia   . Cancer (HCC)    PSA ELEVATED  . Carotid artery occlusion   . Cataract   . Chronic kidney disease    DR MATTINGLY   . Depression   . Diabetes mellitus   . Dyspnea   . GERD (gastroesophageal reflux disease)   . Hearing deficit   . History of kidney stones   . Hyperlipidemia   . Hypertension   . Myocardial infarction ([place]) 2011  . Stroke (cerebrum) (HCC)    weakness in left hand  04/2015, 05/20/16  . Thyroid disease    hypothyroid     FAMILY HISTORY:   Family History  Problem Relation Age of Onset  . Dementia Maternal Grandfather     SOCIAL HISTORY:   Social History   Tobacco Use  . Smoking status: Former Research scientist (life sciences)  . Smokeless tobacco: Never Used  Substance Use Topics  . Alcohol use: Yes    Alcohol/week: 1.2 oz    Types: 2 Glasses of wine per week    Comment: rarely     ALLERGIES:   Allergies  Allergen Reactions  . Lisinopril Other (See Comments)    "made me lose my voice"     CURRENT MEDICATIONS:   Current Outpatient Medications  Medication Sig Dispense Refill  . albuterol (PROVENTIL HFA;VENTOLIN HFA) 108 (90 Base) MCG/ACT inhaler Inhale 2 puffs into the lungs every 6 (six) hours as needed for wheezing  or shortness of breath.    [name] aspirin 325 MG EC tablet Take 325 mg by mouth daily.    . calcitRIOL (ROCALTROL) 0.25 MCG capsule Take 0.25 mcg by mouth. Given at Dialysis on Tues, Thurs, Sat.    . calcium acetate (PHOSLO) 667 MG capsule TAKE 1 (ONE) TABLET THREE TIMES DAILY WITH MEALS  5  . clopidogrel (PLAVIX) 75 MG tablet Take 1 tablet (75 mg total) by mouth daily. 30 tablet 1  . ezetimibe (ZETIA) 10 MG tablet Take 1 tablet (10 mg total) by mouth daily. 30 tablet 1  . fexofenadine (ALLEGRA) 180 MG tablet Take 180 mg by mouth daily.    . fluticasone furoate-vilanterol (BREO ELLIPTA) 100-25 MCG/INH AEPB Inhale 1 puff into the lungs daily as needed (congestion).    . hydrALAZINE (APRESOLINE) 100 MG tablet Take 100 mg by mouth 2 (two) times daily. Does not take in AM on Tues, Thurs, Sat.    . insulin degludec (TRESIBA FLEXTOUCH) 100 UNIT/ML SOPN FlexTouch Pen Inject 25 Units into the skin daily at 10 pm.    . levothyroxine (SYNTHROID, LEVOTHROID) 50 MCG tablet Take 50 mcg by mouth daily before breakfast.     . metoprolol succinate (TOPROL-XL) 50 MG 24 hr tablet Take 50 mg by  mouth 2 (two) times daily. Does not take in am on Tues, Thurs, Sat.    . Multiple Vitamins-Minerals (MULTIVITAMIN WITH MINERALS) tablet Take 1 tablet by mouth daily.    . pantoprazole (PROTONIX) 40 MG tablet Take 40 mg by mouth daily.    . sertraline (ZOLOFT) 25 MG tablet Take 25 mg by mouth daily.    . simvastatin (ZOCOR) 40 MG tablet Take 1 tablet (40 mg total) by mouth daily at 6 PM. (Patient taking differently: Take 40 mg by mouth every evening. ) 30 tablet 0  . sodium bicarbonate 650 MG tablet Take 650 mg by mouth 3 (three) times daily.  5   No current facility-administered medications for this visit.     REVIEW OF SYSTEMS:   [X]  denotes positive finding, [ ]  denotes negative finding Cardiac  Comments:  Chest pain or chest pressure:    Shortness of breath upon exertion:    Short of breath when lying flat:      Irregular heart rhythm:        Vascular    Pain in calf, thigh, or hip brought on by ambulation:    Pain in feet at night that wakes you up from your sleep:     Blood clot in your veins:    Leg swelling:         Pulmonary    Oxygen at home:    Productive cough:     Wheezing:         Neurologic    Sudden weakness in arms or legs:     Sudden numbness in arms or legs:     Sudden onset of difficulty speaking or slurred speech:    Temporary loss of vision in one eye:     Problems with dizziness:         Gastrointestinal    Blood in stool:     Vomited blood:         Genitourinary    Burning when urinating:     Blood in urine:        Psychiatric    Major depression:         Hematologic    Bleeding problems:    Problems with blood clotting too easily:        Skin    Rashes or ulcers:        Constitutional    Fever or chills:      PHYSICAL EXAM:   Vitals:   09/29/17 1101  BP: (!) 146/64  Pulse: (!) 58  Resp: 20  Temp: (!) 97 F (36.1 C)  TempSrc: Oral  SpO2: 97%  Weight: 166 lb 1.6 oz (75.3 kg)  Height: 5\' 8"  (1.727 m)    GENERAL: The patient is a well-nourished male, in no acute distress. The vital signs are documented above. CARDIAC: There is a regular rate and rhythm.  VASCULAR: Palpable brachial pulse bilaterally PULMONARY: Non-labored respirations MUSCULOSKELETAL: There are no major deformities or cyanosis. NEUROLOGIC: No focal weakness or paresthesias are detected. SKIN: There are no ulcers or rashes noted. PSYCHIATRIC: The patient has a normal affect.  STUDIES:   *+-----------------+-------------+----------+--------+ Left Cephalic  Diameter (cm)Depth (cm)Findings +-----------------+-------------+----------+--------+ Prox upper arm    0.11             +-----------------+-------------+----------+--------+ Mid upper arm    0.12             +-----------------+-------------+----------+--------+ Dist  upper arm    0.13             +-----------------+-------------+----------+--------+  Antecubital fossa  0.15             +-----------------+-------------+----------+--------+ Prox forearm     0.25             +-----------------+-------------+----------+--------+ Mid forearm     0.17             +-----------------+-------------+----------+--------+ Dist forearm     0.11             +-----------------+-------------+----------+--------+  +-----------------+-------------+----------+---------+ Left Basilic   Diameter (cm)Depth (cm)Findings  +-----------------+-------------+----------+---------+ Prox upper arm    0.35              +-----------------+-------------+----------+---------+ Mid upper arm    0.39              +-----------------+-------------+----------+---------+ Dist upper arm    0.29              +-----------------+-------------+----------+---------+ Antecubital fossa  0.26        branching +-----------------+-------------+----------+---------+ Prox forearm     0.23        branching +-----------------+-------------+----------+---------+**  MEDICAL ISSUES:   ESRD: We discussed proceeding with a staged basilic vein fistula.  The first stage is been scheduled for Wednesday, August 21.  I discussed the risk benefit of the procedure including the need for subsequent operations and the risk of not maturity.  He will stop his Plavix 5 days prior to the operation.    [name], MD [facility] [phone] Pager [phone]

## 2017-10-06 ENCOUNTER — Ambulatory Visit: Payer: Medicare Other | Admitting: Neurology

## 2017-10-07 ENCOUNTER — Ambulatory Visit: Payer: Medicare Other | Admitting: Neurology

## 2017-10-11 IMAGING — DX DG CHEST 2V
3 series · 3 of 3 positions shown · non-contrast
Comparison: 06/15/2012

CLINICAL DATA: aphasia and right arm weakness, nausea and vomiting

EXAM:
CHEST  2 VIEW

[x chest ap]
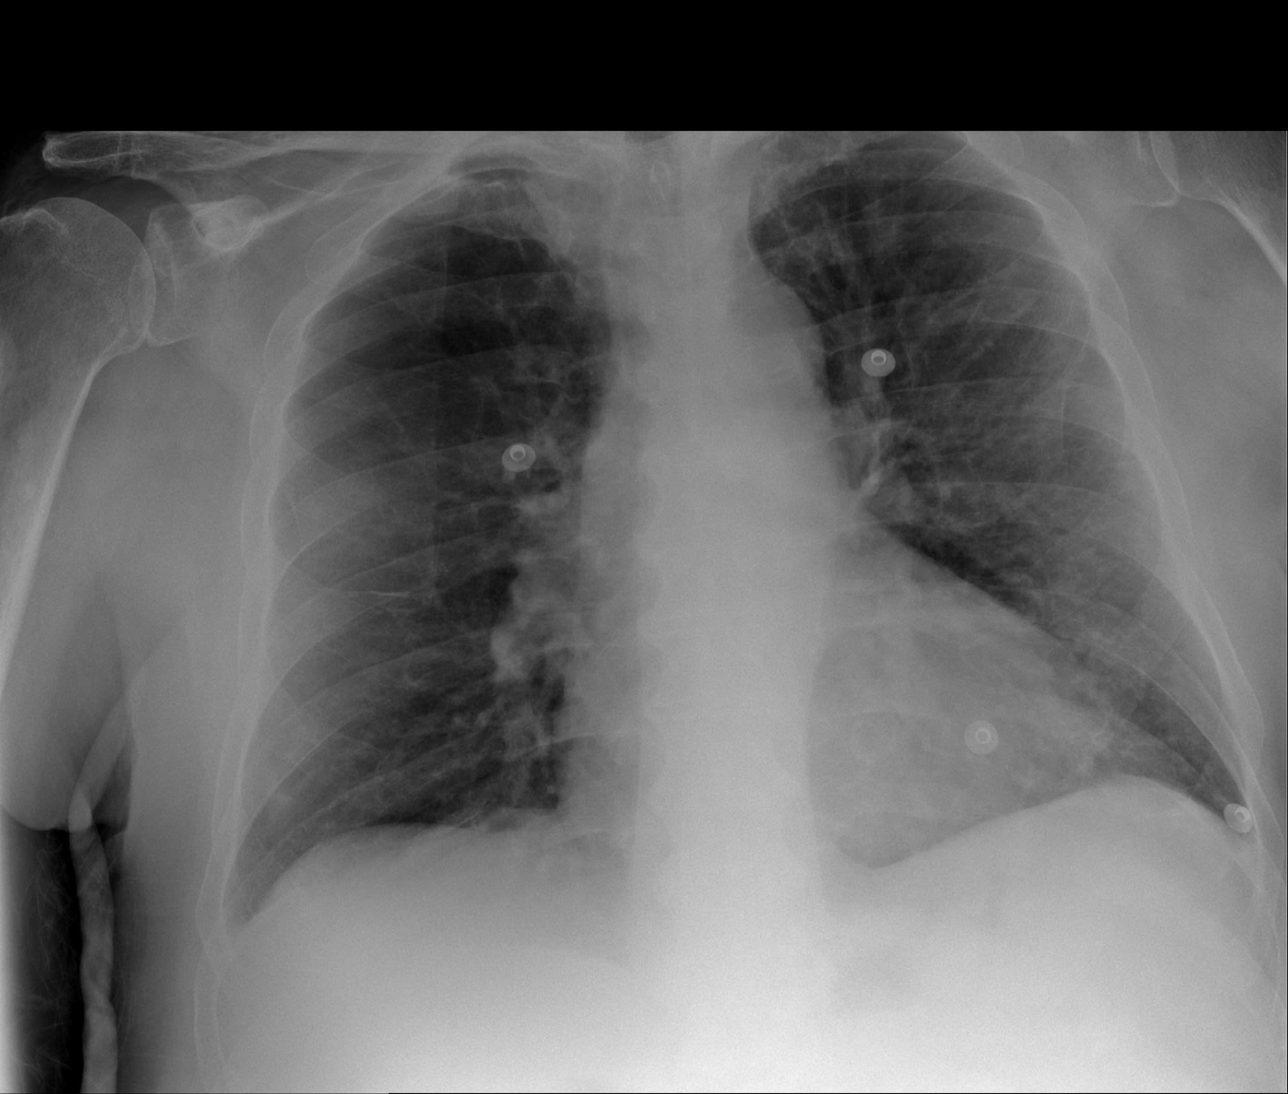

[w chest lat (1 of 2)]
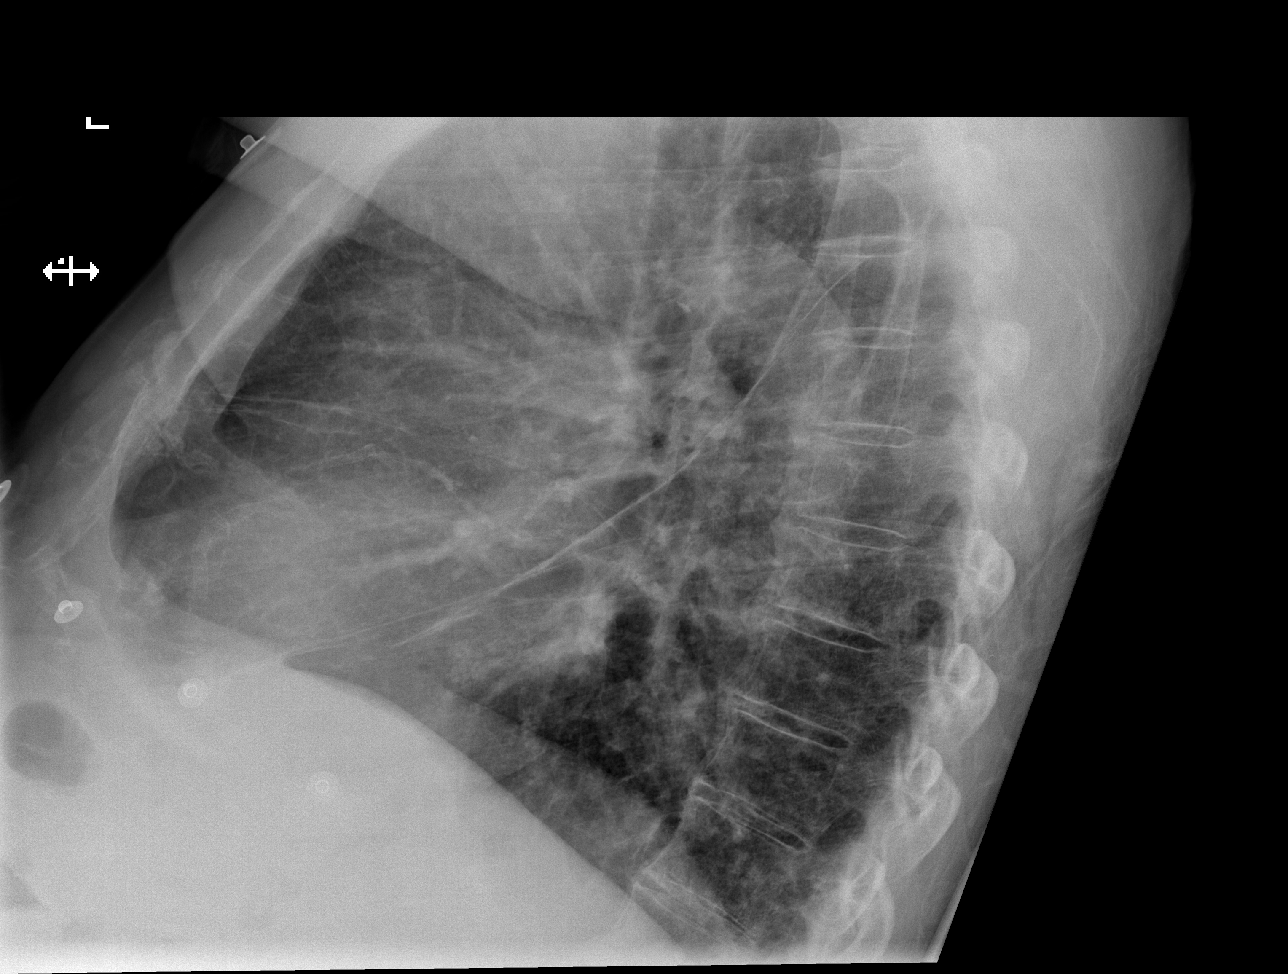

[w chest lat (2 of 2)]
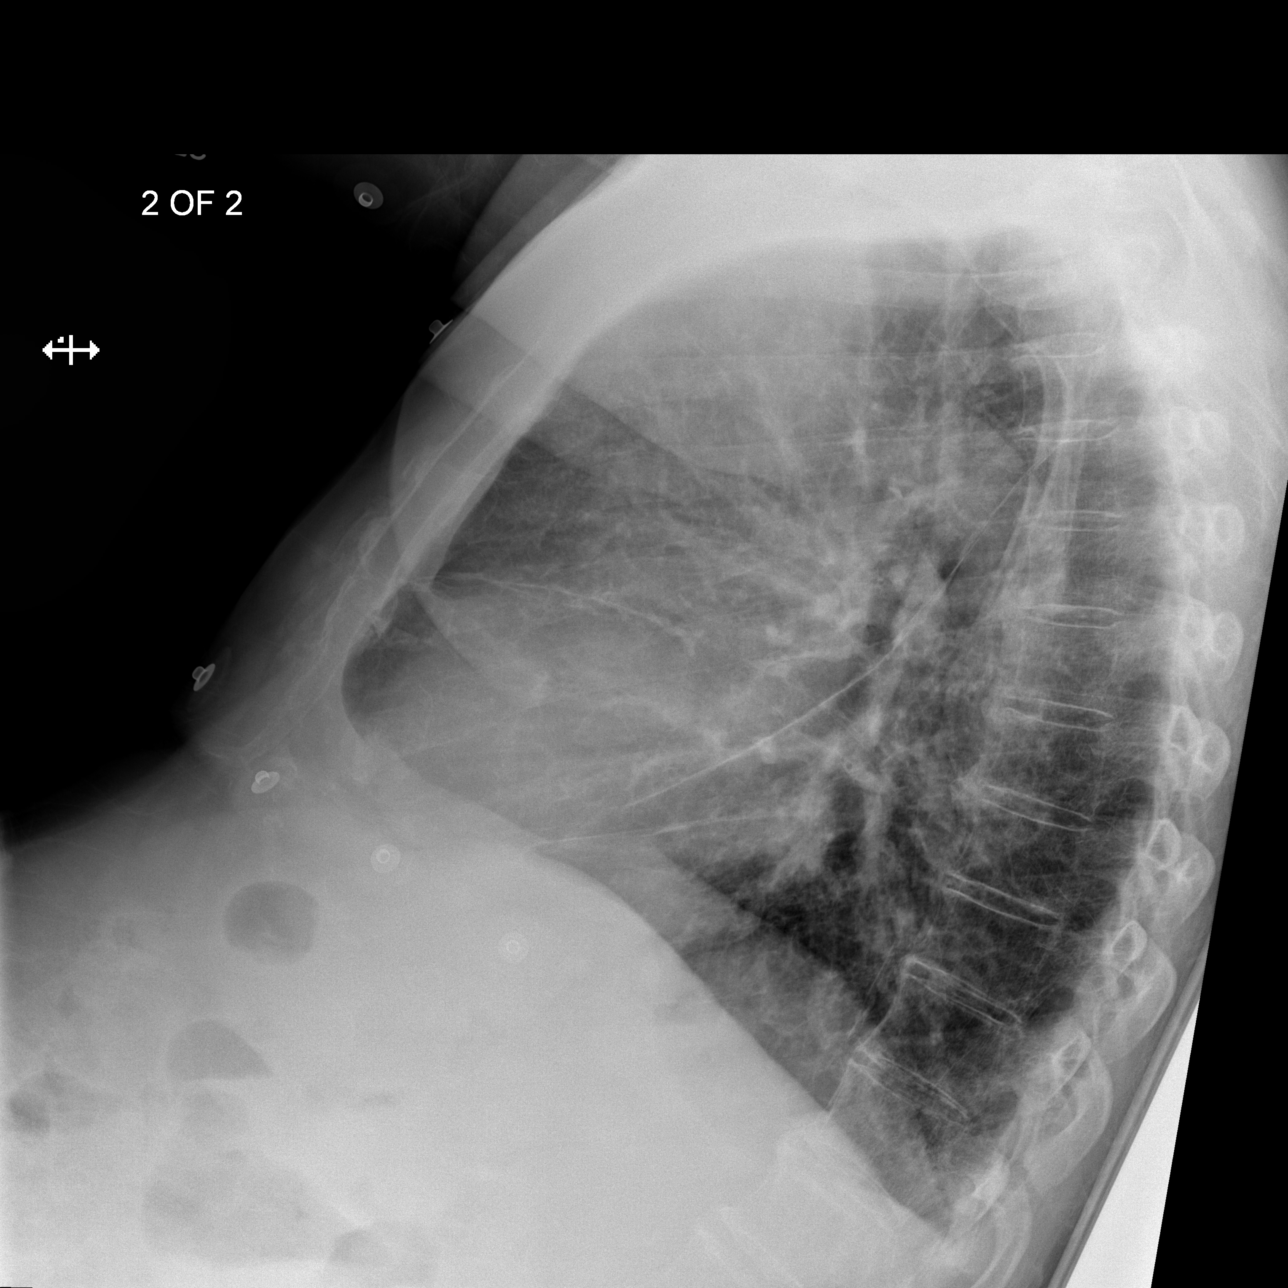

[3 of 3 positions shown; findings below may reference images not displayed]

FINDINGS: Mild cardiac enlargement. Vascular pattern normal. Mild scarring or
atelectasis at both lung bases. No significant parenchymal
opacities.
IMPRESSION: No active cardiopulmonary disease.

## 2017-10-13 ENCOUNTER — Encounter (HOSPITAL_COMMUNITY): Payer: Self-pay | Admitting: *Deleted

## 2017-10-13 ENCOUNTER — Other Ambulatory Visit: Payer: Self-pay

## 2017-10-13 NOTE — Progress Notes (Signed)
Spoke with pt's daughter, Mickel Baas for pre-op call. DPR on file. Pt has hx of MI many years ago and 2 strokes. Pt has a loop recorder (not a pacemaker). She states pt does not c/o chest pain. Pt is a type 2 diabetic and states his most recent A1C was done in the past 2 weeks and it was below 6.0. She states pt does not check his blood sugar at home, he does have a CBG meter to use if needed. Instructed Mickel Baas to have pt take 1/2 of his regular dose of Tresiba Insulin Tuesday evening (will take 7 units). Instructed her to have pt check his blood sugar Wednesday AM when he gets up and every 2 hours until he leaves for the hospital. If blood sugar is 70 or below, treat with 1/2 cup of clear juice (apple or cranberry) and recheck blood sugar 15 minutes after drinking juice. If blood sugar continues to be 70 or below, call the Short Stay department and ask to speak to a nurse. Mickel Baas voiced understanding.

## 2017-10-14 ENCOUNTER — Other Ambulatory Visit: Payer: Self-pay | Admitting: Internal Medicine

## 2017-10-14 NOTE — Progress Notes (Signed)
Anesthesia Chart Review: SAME DAY WORKUP   Case:  [id number] Date/Time:  10/15/17 1350   Procedure:  FIRST STAGE BASILIC VEIN TRANSPOSITION LEFT UPPER EXTREMITY (Left )   Anesthesia type:  Monitor Anesthesia Care   Pre-op diagnosis:  END STAGE RENAL DISEASE FOR HEMODIALYSIS ACCESS   Location:  [facility] / [place] [facility]   Surgeon:  Serafina Mitchell, MD      DISCUSSION: 82yo male former smoker for above procedure. Pertinent hx includes DMII, HLD, MI (2011), CAD (angioplasty with stent x 3 in 2011), HOH, Depression, ESRD on HD TThSa, GERD, HTN, Hypothyroid, Anemia, Carotid stenosis (s/p Right endarterectomy 2018), Stroke (2017 and 2018 with residual Left hand weakness). He has a loop recorder due to hx of cryptogenic stroke.   Will stop Plavix 5d prior to surgery per Dr. Trula Slade.  Anticipate can proceed as scheduled barring acute status change and DOS labs are acceptable.   VS: There were no vitals taken for this visit.  PROVIDERS: Burnard Bunting, MD is PCP  Thompson Grayer, MD is EP cardiologist  Ezzard Standing, MD is Cardiologist   LABS: Will need DOS labs  Labs Reviewed - No data to display   IMAGES: PORTABLE CHEST 1 VIEW 06/07/2016  COMPARISON:  05/21/2016.  FINDINGS: The heart size and mediastinal contours are within normal limits. Both lungs are clear. The visualized skeletal structures are unremarkable. Loop recorder.  IMPRESSION: No active disease.  Stable exam.   EKG: 06/24/2017:Sinus rhythm with marked sinus arrhythmia and PACs. ST & T wave abnormality, consider inferior ischemia. Prolonged QT/QTc 462/465 ms  CV: Echo TTE 05/21/2016: Study Conclusions  - Left ventricle: The cavity size was normal. Wall thickness was   increased in a pattern of mild LVH. There was mild focal basal   hypertrophy of the septum. Systolic function was normal. The   estimated ejection fraction was in the range of 55% to 60%. Wall   motion was normal; there were no regional wall  motion   abnormalities. Doppler parameters are consistent with abnormal   left ventricular relaxation (grade 1 diastolic dysfunction).   Doppler parameters are consistent with high ventricular filling   pressure. - Mitral valve: Severely calcified annulus.  Impressions:  - Normal LV systolic function; grade 1 diastolic dysfunction with   elevated LV filling pressure; severe MAC; trace MR and TR.  Echo TEE 05/16/2015: Study Conclusions  - Left ventricle: The cavity size was normal. Wall thickness was   normal. Systolic function was normal. - Aortic valve: No evidence of vegetation. - Mitral valve: There was mild to moderate regurgitation. - Left atrium: No evidence of thrombus in the atrial cavity or   appendage. - Atrial septum: There was a very small patent foramen ovale. - Pulmonic valve: No evidence of vegetation.  Carotid US 04/17/2017: Final Interpretation: Right Carotid: Velocities in the right ICA are consistent with a 60-79%        stenosis. CEA appears to be reoccluding.  Left Carotid: Velocities in the left ICA are consistent with a 40-59% stenosis.  Vertebrals: Both vertebral arteries were patent with antegrade flow. Subclavians: Normal flow hemodynamics were seen in bilateral subclavian       arteries.  Past Medical History:  Diagnosis Date  . Allergy    year round  . Anemia   . Cancer (HCC)    PSA ELEVATED  . Carotid artery occlusion   . Cataract   . Chronic kidney disease    [facility] , Dialysis  T/Th/Sa  . Depression   . Diabetes mellitus   . Dyspnea   . GERD (gastroesophageal reflux disease)   . Hearing deficit   . History of kidney stones   . Hyperlipidemia   . Hypertension   . Myocardial infarction ([place]) 2011  . Stroke (cerebrum) (HCC)    weakness in left hand  04/2015, 05/20/16  . Thyroid disease    hypothyroid    Past Surgical History:  Procedure Laterality Date  . A/V FISTULAGRAM Right 06/24/2017   Procedure: A/V  FISTULAGRAM;  Surgeon: Serafina Mitchell, MD;  Location: [facility];  Service: Cardiovascular;  Laterality: Right;  rt  lower arm  . AV FISTULA PLACEMENT Right 07/25/2016   Procedure: ARTERIOVENOUS (AV) FISTULA CREATION-RIGHT;  Surgeon: Serafina Mitchell, MD;  Location: [facility];  Service: Vascular;  Laterality: Right;  . COLONOSCOPY    . CORONARY ANGIOPLASTY WITH STENT PLACEMENT  07/2009   3 stents  . ELBOW SURGERY     LEFT  . ENDARTERECTOMY Right 06/06/2016   Procedure: RIGHT CAROTID ENDARTERECTOMY WITH PATCH ANGIOPLASTY;  Surgeon: Serafina Mitchell, MD;  Location: [facility];  Service: Vascular;  Laterality: Right;  . EP IMPLANTABLE DEVICE N/A 05/16/2015   Procedure: Loop Recorder Insertion;  Surgeon: Thompson Grayer, MD;  Location: [facility];  Service: Cardiovascular;  Laterality: N/A;  . ESOPHAGOSCOPY WITH DILITATION    . FISTULA SUPERFICIALIZATION Right 12/05/2016   Procedure: Superficialization and Ligation of Branches OF RIGHT ARM RADIOCEPHALIC FISTULA;  Surgeon: Serafina Mitchell, MD;  Location: [facility] OR;  Service: Vascular;  Laterality: Right;  . insulin pump needs to be removed for mri    . NOSE SURGERY    . PERIPHERAL VASCULAR BALLOON ANGIOPLASTY  06/24/2017   Procedure: PERIPHERAL VASCULAR BALLOON ANGIOPLASTY;  Surgeon: Serafina Mitchell, MD;  Location: [facility];  Service: Cardiovascular;;  rt arm fistula   . stents    . TEE WITHOUT CARDIOVERSION N/A 05/16/2015   Procedure: TRANSESOPHAGEAL ECHOCARDIOGRAM (TEE);  Surgeon: Thayer Headings, MD;  Location: [facility] ENDOSCOPY;  Service: Cardiovascular;  Laterality: N/A;    MEDICATIONS: No current facility-administered medications for this encounter.    [name] albuterol (PROVENTIL HFA;VENTOLIN HFA) 108 (90 Base) MCG/ACT inhaler  . aspirin 325 MG EC tablet  . B Complex-C-Folic Acid (DIALYVITE 323) 0.8 MG TABS  . calcium acetate (PHOSLO) 667 MG capsule  . clopidogrel (PLAVIX) 75 MG tablet  . ezetimibe (ZETIA) 10 MG tablet  . fexofenadine  (ALLEGRA) 180 MG tablet  . hydrALAZINE (APRESOLINE) 100 MG tablet  . insulin degludec (TRESIBA FLEXTOUCH) 100 UNIT/ML SOPN FlexTouch Pen  . levothyroxine (SYNTHROID, LEVOTHROID) 50 MCG tablet  . metoprolol succinate (TOPROL-XL) 50 MG 24 hr tablet  . pantoprazole (PROTONIX) 40 MG tablet  . sertraline (ZOLOFT) 25 MG tablet  . simvastatin (ZOCOR) 40 MG tablet  . sodium bicarbonate 650 MG tablet    Wynonia Musty [facility] Short Stay Center/Anesthesiology Phone [phone] 10/14/2017 9:46 AM

## 2017-10-15 ENCOUNTER — Ambulatory Visit (HOSPITAL_COMMUNITY)
Admission: RE | Admit: 2017-10-15 | Discharge: 2017-10-15 | Disposition: A | Discharge status: Home / Self Care | Payer: Medicare Other | Source: Ambulatory Visit | Attending: Surgery | Admitting: Surgery

## 2017-10-15 ENCOUNTER — Ambulatory Visit (HOSPITAL_COMMUNITY): Payer: Medicare Other | Admitting: Physician Assistant

## 2017-10-15 ENCOUNTER — Other Ambulatory Visit: Payer: Self-pay

## 2017-10-15 ENCOUNTER — Encounter (HOSPITAL_COMMUNITY)
Admission: RE | Disposition: A | Discharge status: Home / Self Care | Payer: Self-pay | Source: Ambulatory Visit | Attending: Surgery

## 2017-10-15 ENCOUNTER — Encounter (HOSPITAL_COMMUNITY): Payer: Self-pay | Admitting: Surgery

## 2017-10-15 DIAGNOSIS — E039 Hypothyroidism, unspecified: Secondary | ICD-10-CM | POA: Diagnosis not present

## 2017-10-15 DIAGNOSIS — Z87442 Personal history of urinary calculi: Secondary | ICD-10-CM | POA: Insufficient documentation

## 2017-10-15 DIAGNOSIS — Z79890 Hormone replacement therapy (postmenopausal): Secondary | ICD-10-CM | POA: Diagnosis not present

## 2017-10-15 DIAGNOSIS — E1122 Type 2 diabetes mellitus with diabetic chronic kidney disease: Secondary | ICD-10-CM | POA: Insufficient documentation

## 2017-10-15 DIAGNOSIS — Z7902 Long term (current) use of antithrombotics/antiplatelets: Secondary | ICD-10-CM | POA: Diagnosis not present

## 2017-10-15 DIAGNOSIS — K219 Gastro-esophageal reflux disease without esophagitis: Secondary | ICD-10-CM | POA: Diagnosis not present

## 2017-10-15 DIAGNOSIS — Z8673 Personal history of transient ischemic attack (TIA), and cerebral infarction without residual deficits: Secondary | ICD-10-CM | POA: Diagnosis not present

## 2017-10-15 DIAGNOSIS — N186 End stage renal disease: Secondary | ICD-10-CM | POA: Insufficient documentation

## 2017-10-15 DIAGNOSIS — Z859 Personal history of malignant neoplasm, unspecified: Secondary | ICD-10-CM | POA: Diagnosis not present

## 2017-10-15 DIAGNOSIS — Z7982 Long term (current) use of aspirin: Secondary | ICD-10-CM | POA: Insufficient documentation

## 2017-10-15 DIAGNOSIS — N185 Chronic kidney disease, stage 5: Secondary | ICD-10-CM

## 2017-10-15 DIAGNOSIS — F329 Major depressive disorder, single episode, unspecified: Secondary | ICD-10-CM | POA: Diagnosis not present

## 2017-10-15 DIAGNOSIS — I6529 Occlusion and stenosis of unspecified carotid artery: Secondary | ICD-10-CM | POA: Diagnosis not present

## 2017-10-15 DIAGNOSIS — I129 Hypertensive chronic kidney disease with stage 1 through stage 4 chronic kidney disease, or unspecified chronic kidney disease: Secondary | ICD-10-CM | POA: Insufficient documentation

## 2017-10-15 DIAGNOSIS — Z79899 Other long term (current) drug therapy: Secondary | ICD-10-CM | POA: Diagnosis not present

## 2017-10-15 DIAGNOSIS — Z87891 Personal history of nicotine dependence: Secondary | ICD-10-CM | POA: Diagnosis not present

## 2017-10-15 DIAGNOSIS — H269 Unspecified cataract: Secondary | ICD-10-CM | POA: Insufficient documentation

## 2017-10-15 DIAGNOSIS — Z888 Allergy status to other drugs, medicaments and biological substances status: Secondary | ICD-10-CM | POA: Insufficient documentation

## 2017-10-15 DIAGNOSIS — I252 Old myocardial infarction: Secondary | ICD-10-CM | POA: Diagnosis not present

## 2017-10-15 HISTORY — PX: BASCILIC VEIN TRANSPOSITION: SHX5742

## 2017-10-15 LAB — POCT I-STAT 4, (NA,K, GLUC, HGB,HCT)
Glucose, Bld: 114 mg/dL — ABNORMAL HIGH (ref 70–99)
HCT: 28 % — ABNORMAL LOW (ref 39.0–52.0)
Hemoglobin: 9.5 g/dL — ABNORMAL LOW (ref 13.0–17.0)
Potassium: 2.8 mmol/L — ABNORMAL LOW (ref 3.5–5.1)
Sodium: 141 mmol/L (ref 135–145)

## 2017-10-15 LAB — GLUCOSE, CAPILLARY
Glucose-Capillary: 94 mg/dL (ref 70–99)
Glucose-Capillary: 100 mg/dL — ABNORMAL HIGH (ref 70–99)
Glucose-Capillary: 101 mg/dL — ABNORMAL HIGH (ref 70–99)

## 2017-10-15 SURGERY — TRANSPOSITION, VEIN, BASILIC
Anesthesia: General | Laterality: Left

## 2017-10-15 MED ORDER — CEFAZOLIN SODIUM-DEXTROSE 2-4 GM/100ML-% IV SOLN
2.0000 g | INTRAVENOUS | Status: AC
  Administered 2017-10-15: 2 g via INTRAVENOUS
  Filled 2017-10-15: qty 100

## 2017-10-15 MED ORDER — PROTAMINE SULFATE 10 MG/ML IV SOLN
INTRAVENOUS | Status: DC | PRN
  Administered 2017-10-15: 10 mg via INTRAVENOUS
  Administered 2017-10-15: 15 mg via INTRAVENOUS

## 2017-10-15 MED ORDER — ONDANSETRON HCL 4 MG/2ML IJ SOLN: 4.0000 mg | Freq: Once | INTRAMUSCULAR | Status: DC | PRN

## 2017-10-15 MED ORDER — HEMOSTATIC AGENTS (NO CHARGE) OPTIME
TOPICAL | Status: DC | PRN
  Administered 2017-10-15: 1 via TOPICAL

## 2017-10-15 MED ORDER — PROTAMINE SULFATE 10 MG/ML IV SOLN
INTRAVENOUS | Status: AC
  Filled 2017-10-15: qty 5

## 2017-10-15 MED ORDER — PHENYLEPHRINE 40 MCG/ML (10ML) SYRINGE FOR IV PUSH (FOR BLOOD PRESSURE SUPPORT)
PREFILLED_SYRINGE | INTRAVENOUS | Status: AC
  Filled 2017-10-15: qty 10

## 2017-10-15 MED ORDER — OXYCODONE HCL 5 MG/5ML PO SOLN: 5.0000 mg | Freq: Once | ORAL | Status: DC | PRN

## 2017-10-15 MED ORDER — PHENYLEPHRINE 40 MCG/ML (10ML) SYRINGE FOR IV PUSH (FOR BLOOD PRESSURE SUPPORT)
PREFILLED_SYRINGE | INTRAVENOUS | Status: DC | PRN
  Administered 2017-10-15: 80 ug via INTRAVENOUS
  Administered 2017-10-15: 40 ug via INTRAVENOUS
  Administered 2017-10-15 (×2): 80 ug via INTRAVENOUS

## 2017-10-15 MED ORDER — ONDANSETRON HCL 4 MG/2ML IJ SOLN
INTRAMUSCULAR | Status: AC
  Filled 2017-10-15: qty 2

## 2017-10-15 MED ORDER — SODIUM CHLORIDE 0.9 % IV SOLN
INTRAVENOUS | Status: DC | PRN
  Administered 2017-10-15: 16:00:00

## 2017-10-15 MED ORDER — GLYCOPYRROLATE PF 0.2 MG/ML IJ SOSY
PREFILLED_SYRINGE | INTRAMUSCULAR | Status: AC
  Filled 2017-10-15: qty 2

## 2017-10-15 MED ORDER — SODIUM CHLORIDE 0.9 % IV SOLN
INTRAVENOUS | Status: DC
  Administered 2017-10-15: 15:00:00 via INTRAVENOUS

## 2017-10-15 MED ORDER — 0.9 % SODIUM CHLORIDE (POUR BTL) OPTIME
TOPICAL | Status: DC | PRN
  Administered 2017-10-15: 1000 mL

## 2017-10-15 MED ORDER — GLYCOPYRROLATE PF 0.2 MG/ML IJ SOSY
PREFILLED_SYRINGE | INTRAMUSCULAR | Status: DC | PRN
  Administered 2017-10-15: .1 mg via INTRAVENOUS

## 2017-10-15 MED ORDER — FENTANYL CITRATE (PF) 250 MCG/5ML IJ SOLN
INTRAMUSCULAR | Status: DC | PRN
  Administered 2017-10-15: 25 ug via INTRAVENOUS

## 2017-10-15 MED ORDER — FENTANYL CITRATE (PF) 250 MCG/5ML IJ SOLN
INTRAMUSCULAR | Status: AC
  Filled 2017-10-15: qty 5

## 2017-10-15 MED ORDER — SUGAMMADEX SODIUM 500 MG/5ML IV SOLN
INTRAVENOUS | Status: AC
  Filled 2017-10-15: qty 5

## 2017-10-15 MED ORDER — EPHEDRINE SULFATE-NACL 50-0.9 MG/10ML-% IV SOSY
PREFILLED_SYRINGE | INTRAVENOUS | Status: DC | PRN
  Administered 2017-10-15: 5 mg via INTRAVENOUS
  Administered 2017-10-15: 10 mg via INTRAVENOUS

## 2017-10-15 MED ORDER — ONDANSETRON HCL 4 MG/2ML IJ SOLN
INTRAMUSCULAR | Status: DC | PRN
  Administered 2017-10-15: 4 mg via INTRAVENOUS

## 2017-10-15 MED ORDER — PROPOFOL 10 MG/ML IV BOLUS
INTRAVENOUS | Status: DC | PRN
  Administered 2017-10-15: 140 mg via INTRAVENOUS

## 2017-10-15 MED ORDER — CHLORHEXIDINE GLUCONATE 4 % EX LIQD: 60.0000 mL | Freq: Once | CUTANEOUS | Status: DC

## 2017-10-15 MED ORDER — EPHEDRINE 5 MG/ML INJ
INTRAVENOUS | Status: AC
  Filled 2017-10-15: qty 10

## 2017-10-15 MED ORDER — FENTANYL CITRATE (PF) 100 MCG/2ML IJ SOLN: 25.0000 ug | INTRAMUSCULAR | Status: DC | PRN

## 2017-10-15 MED ORDER — SODIUM CHLORIDE 0.9 % IV SOLN
INTRAVENOUS | Status: AC
  Filled 2017-10-15: qty 1.2

## 2017-10-15 MED ORDER — HYDROCODONE-ACETAMINOPHEN 5-325 MG PO TABS: 1.0000 | ORAL_TABLET | Freq: Four times a day (QID) | ORAL | 0 refills | Status: DC | PRN

## 2017-10-15 MED ORDER — HEPARIN SODIUM (PORCINE) 1000 UNIT/ML IJ SOLN
INTRAMUSCULAR | Status: AC
  Filled 2017-10-15: qty 1

## 2017-10-15 MED ORDER — PROPOFOL 10 MG/ML IV BOLUS
INTRAVENOUS | Status: AC
  Filled 2017-10-15: qty 20

## 2017-10-15 MED ORDER — OXYCODONE HCL 5 MG PO TABS: 5.0000 mg | ORAL_TABLET | Freq: Once | ORAL | Status: DC | PRN

## 2017-10-15 MED ORDER — HEPARIN SODIUM (PORCINE) 1000 UNIT/ML IJ SOLN
INTRAMUSCULAR | Status: DC | PRN
  Administered 2017-10-15: 3000 [IU] via INTRAVENOUS

## 2017-10-15 SURGICAL SUPPLY — 32 items
ARMBAND PINK RESTRICT EXTREMIT (MISCELLANEOUS) ×3 IMPLANT
CANISTER SUCT 3000ML PPV (MISCELLANEOUS) ×3 IMPLANT
CLIP VESOCCLUDE MED 24/CT (CLIP) ×3 IMPLANT
CLIP VESOCCLUDE MED 6/CT (CLIP) IMPLANT
CLIP VESOCCLUDE SM WIDE 24/CT (CLIP) ×3 IMPLANT
CLIP VESOCCLUDE SM WIDE 6/CT (CLIP) IMPLANT
COVER PROBE W GEL 5X96 (DRAPES) ×3 IMPLANT
DERMABOND ADVANCED (GAUZE/BANDAGES/DRESSINGS) ×2
DERMABOND ADVANCED .7 DNX12 (GAUZE/BANDAGES/DRESSINGS) ×1 IMPLANT
ELECT REM PT RETURN 9FT ADLT (ELECTROSURGICAL) ×3
ELECTRODE REM PT RTRN 9FT ADLT (ELECTROSURGICAL) ×1 IMPLANT
GLOVE BIOGEL PI IND STRL 7.5 (GLOVE) ×1 IMPLANT
GLOVE BIOGEL PI INDICATOR 7.5 (GLOVE) ×2
GLOVE SURG SS PI 7.5 STRL IVOR (GLOVE) ×3 IMPLANT
GOWN STRL REUS W/ TWL LRG LVL3 (GOWN DISPOSABLE) ×2 IMPLANT
GOWN STRL REUS W/ TWL XL LVL3 (GOWN DISPOSABLE) ×2 IMPLANT
GOWN STRL REUS W/TWL LRG LVL3 (GOWN DISPOSABLE) ×4
GOWN STRL REUS W/TWL XL LVL3 (GOWN DISPOSABLE) ×4
HEMOSTAT SNOW SURGICEL 2X4 (HEMOSTASIS) ×3 IMPLANT
KIT BASIN OR (CUSTOM PROCEDURE TRAY) ×3 IMPLANT
KIT TURNOVER KIT B (KITS) ×3 IMPLANT
NS IRRIG 1000ML POUR BTL (IV SOLUTION) ×3 IMPLANT
PACK CV ACCESS (CUSTOM PROCEDURE TRAY) ×3 IMPLANT
PAD ARMBOARD 7.5X6 YLW CONV (MISCELLANEOUS) ×6 IMPLANT
SUT PROLENE 6 0 CC (SUTURE) ×3 IMPLANT
SUT SILK 2 0 SH (SUTURE) IMPLANT
SUT VIC AB 3-0 SH 27 (SUTURE) ×2
SUT VIC AB 3-0 SH 27X BRD (SUTURE) ×1 IMPLANT
SUT VICRYL 4-0 PS2 18IN ABS (SUTURE) ×3 IMPLANT
TOWEL GREEN STERILE (TOWEL DISPOSABLE) ×3 IMPLANT
UNDERPAD 30X30 (UNDERPADS AND DIAPERS) ×3 IMPLANT
WATER STERILE IRR 1000ML POUR (IV SOLUTION) ×3 IMPLANT

## 2017-10-15 NOTE — Transfer of Care (Signed)
Immediate Anesthesia Transfer of Care Note  Patient: Robert [name].  Procedure(s) Performed: FIRST STAGE BASILIC VEIN TRANSPOSITION LEFT UPPER EXTREMITY (Left )  Patient Location: PACU  Anesthesia Type:General  Level of Consciousness: lethargic and responds to stimulation  Airway & Oxygen Therapy: Patient Spontanous Breathing and Patient connected to nasal cannula oxygen  Post-op Assessment: Report given to RN  Post vital signs: Reviewed and stable  Last Vitals:  Vitals Value Taken Time  BP    Temp    Pulse 58 10/15/2017  4:25 PM  Resp 17 10/15/2017  4:25 PM  SpO2 97 % 10/15/2017  4:25 PM  Vitals shown include unvalidated device data.  Last Pain:  Vitals:   10/15/17 1204  TempSrc:   PainSc: 0-No pain      Patients Stated Pain Goal: 3 (75/05/18 3358)  Complications: No apparent anesthesia complications

## 2017-10-15 NOTE — Anesthesia Preprocedure Evaluation (Addendum)
Anesthesia Evaluation  Patient identified by MRN, date of birth, ID band Patient awake    Reviewed: Allergy & Precautions, NPO status , Patient's Chart, lab work & pertinent test results  Airway Mallampati: II  TM Distance: >3 FB Neck ROM: Full    Dental  (+) Teeth Intact, Dental Advisory Given   Pulmonary former smoker,    breath sounds clear to auscultation       Cardiovascular hypertension,  Rhythm:Regular Rate:Normal     Neuro/Psych    GI/Hepatic   Endo/Other  diabetes  Renal/GU      Musculoskeletal   Abdominal   Peds  Hematology   Anesthesia Other Findings   Reproductive/Obstetrics                             Anesthesia Physical Anesthesia Plan  ASA: III  Anesthesia Plan: General   Post-op Pain Management:    Induction: Intravenous  PONV Risk Score and Plan: Ondansetron and Dexamethasone  Airway Management Planned: LMA  Additional Equipment:   Intra-op Plan:   Post-operative Plan:   Informed Consent: I have reviewed the patients History and Physical, chart, labs and discussed the procedure including the risks, benefits and alternatives for the proposed anesthesia with the patient or authorized representative who has indicated his/her understanding and acceptance.     Plan Discussed with: CRNA and Anesthesiologist  Anesthesia Plan Comments:         Anesthesia Quick Evaluation

## 2017-10-15 NOTE — Progress Notes (Addendum)
Spoke with Franchot Mimes, answering for Dr .Linna Caprice that K 2.8 in [place].  1345- spoke to Dr. Linna Caprice to be sure he was aware of K 2.8.  Dr. Linna Caprice stated he thought it was fine.  No new orders given at this time.

## 2017-10-15 NOTE — Anesthesia Procedure Notes (Signed)
Procedure Name: LMA Insertion Date/Time: 10/15/2017 3:04 PM Performed by: Barrington Ellison, CRNA Pre-anesthesia Checklist: Patient identified, Emergency Drugs available, Suction available and Patient being monitored Patient Re-evaluated:Patient Re-evaluated prior to induction Oxygen Delivery Method: Circle System Utilized Preoxygenation: Pre-oxygenation with 100% oxygen Induction Type: IV induction Ventilation: Mask ventilation without difficulty LMA: LMA inserted LMA Size: 4.0 Number of attempts: 1 Placement Confirmation: positive ETCO2 Tube secured with: Tape Dental Injury: Teeth and Oropharynx as per pre-operative assessment

## 2017-10-15 NOTE — Interval H&P Note (Signed)
History and Physical Interval Note:  10/15/2017 2:25 PM  Robert [name].  has presented today for surgery, with the diagnosis of END STAGE RENAL DISEASE FOR HEMODIALYSIS ACCESS  The various methods of treatment have been discussed with the patient and family. After consideration of risks, benefits and other options for treatment, the patient has consented to  Procedure(s): FIRST STAGE BASILIC VEIN TRANSPOSITION LEFT UPPER EXTREMITY (Left) as a surgical intervention .  The patient's history has been reviewed, patient examined, no change in status, stable for surgery.  I have reviewed the patient's chart and labs.  Questions were answered to the patient's satisfaction.     [name]

## 2017-10-15 NOTE — Op Note (Signed)
    Patient name: Robert [name]. MRN: [id number] DOB: 07/17/1935 Sex: male  10/15/2017 Pre-operative Diagnosis: ESRD Post-operative diagnosis:  Same Surgeon:  [name] Assistants: Arlee Muslim, PA Procedure:   First stage left basilic vein fistula creation Anesthesia: General Blood Loss: Minimal Specimens: None  Findings: 4 mm artery which was mildly thickened.  The basilic vein was of adequate caliber measuring approximately 4 mm  Indications:  The patient has previously undergone right fistula creation which did not mature.  He is here today for basilic vein fistula creation  Procedure:  The patient was identified in the holding area and taken to [facility]  The patient was then placed supine on the table. general anesthesia was administered.  The patient was prepped and draped in the usual sterile fashion.  A time out was called and antibiotics were administered.  Ultrasound was used to evaluate the basilic vein in the upper arm.  This was an adequate caliber vein measuring 4-5 mm.  The artery was not calcified and measured 4-5 mm.  An oblique incision was made to the antecubital crease.  I first dissected out the brachial artery.  This was a rather large artery measuring 4-5 mm.  It was not calcified.  I then dissected out the basilic vein.  It was mobilized within the incision and side branches were ligated.  In situ, the vein was at least 3 mm throughout.  Once a had the vein fully mobilized I marked it with an ink pen for orientation and then ligated distally.  The vein distended nicely with heparin saline up to 4-5 mm.  3000 units of heparin was then given.  After the heparin circulated, the brachial artery was occluded with vascular clamps.  A #11 blade was used to make an arteriotomy which was extended longitudinally with Potts scissors.  The vein was then cut the appropriate length and spatulated to fit the size of the arteriotomy.  A running anastomosis was then created with  6-0 Prolene.  Prior to completion, the appropriate flushing maneuvers were performed and the anastomosis was completed.  The clamps were then released.  The vein distended nicely and had a good thrill within it.  The patient had a triphasic radial artery signal and a good Doppler signal throughout the fistula.  I inspected the course of the vein to make sure there were no kinks.  Hemostasis was then achieved.  25 mg of protamine was given.  Once hemostasis was satisfactory, the incision was closed with 2 layers of 3-0 Vicryl followed by Dermabond.  There were no immediate complications.   Disposition: To PACU stable   V. [name], M.D. [facility] Office: [phone] Pager:  [phone]

## 2017-10-15 NOTE — Discharge Instructions (Signed)
° °  [facility] ° °Discharge Instructions ° °AV Fistula or Graft Surgery for Dialysis Access ° °Please refer to the following instructions for your post-procedure care. Your surgeon or physician assistant will discuss any changes with you. ° °Activity ° °You may drive the day following your surgery, if you are comfortable and no longer taking prescription pain medication. Resume full activity as the soreness in your incision resolves. ° °Bathing/Showering ° °You may shower after you go home. Keep your incision dry for 48 hours. Do not soak in a bathtub, hot tub, or swim until the incision heals completely. You may not shower if you have a hemodialysis catheter. ° °Incision Care ° °Clean your incision with mild soap and water after 48 hours. Pat the area dry with a clean towel. You do not need a bandage unless otherwise instructed. Do not apply any ointments or creams to your incision. You may have skin glue on your incision. Do not peel it off. It will come off on its own in about one week. Your arm may swell a bit after surgery. To reduce swelling use pillows to elevate your arm so it is above your heart. Your doctor will tell you if you need to lightly wrap your arm with an ACE bandage. ° °Diet ° °Resume your normal diet. There are not special food restrictions following this procedure. In order to heal from your surgery, it is CRITICAL to get adequate nutrition. Your body requires vitamins, minerals, and protein. Vegetables are the best source of vitamins and minerals. Vegetables also provide the perfect balance of protein. Processed food has little nutritional value, so try to avoid this. ° °Medications ° °Resume taking all of your medications. If your incision is causing pain, you may take over-the counter pain relievers such as acetaminophen (Tylenol). If you were prescribed a stronger pain medication, please be aware these medications can cause nausea and constipation. Prevent  nausea by taking the medication with a snack or meal. Avoid constipation by drinking plenty of fluids and eating foods with high amount of fiber, such as fruits, vegetables, and grains. Do not take Tylenol if you are taking prescription pain medications. ° ° ° ° °Follow up °Your surgeon may want to see you in the office following your access surgery. If so, this will be arranged at the time of your surgery. ° °Please call us immediately for any of the following conditions: ° °Increased pain, redness, drainage (pus) from your incision site °Fever of 101 degrees or higher °Severe or worsening pain at your incision site °Hand pain or numbness. ° °Reduce your risk of vascular disease: ° °Stop smoking. If you would like help, call [facility] at 1-800-QUIT-NOW (1-800-784-8669) or [place] [place] [facility] at 336-586-4000 ° °Manage your cholesterol °Maintain a desired weight °Control your diabetes °Keep your blood pressure down ° °Dialysis ° °It will take several weeks to several months for your new dialysis access to be ready for use. Your surgeon will determine when it is OK to use it. Your nephrologist will continue to direct your dialysis. You can continue to use your Permcath until your new access is ready for use. ° °If you have any questions, please call the office at [phone]. ° °

## 2017-10-15 NOTE — Anesthesia Postprocedure Evaluation (Signed)
Anesthesia Post Note  Patient: Conn Trombetta.  Procedure(s) Performed: FIRST STAGE BASILIC VEIN TRANSPOSITION LEFT UPPER EXTREMITY (Left )     Patient location during evaluation: PACU Anesthesia Type: General Level of consciousness: awake and alert Pain management: pain level controlled Vital Signs Assessment: post-procedure vital signs reviewed and stable Respiratory status: spontaneous breathing, nonlabored ventilation, respiratory function stable and patient connected to nasal cannula oxygen Cardiovascular status: blood pressure returned to baseline and stable Postop Assessment: no apparent nausea or vomiting Anesthetic complications: no    Last Vitals:  Vitals:   10/15/17 1735 10/15/17 1740  BP: 115/65   Pulse: 60 (!) 54  Resp: (!) 25 16  Temp:    SpO2: 93% 93%    Last Pain:  Vitals:   10/15/17 1730  TempSrc:   PainSc: 0-No pain                 Jarrod Bodkins COKER

## 2017-10-16 ENCOUNTER — Telehealth: Payer: Self-pay | Admitting: Surgery

## 2017-10-16 ENCOUNTER — Encounter (HOSPITAL_COMMUNITY): Payer: Self-pay | Admitting: Surgery

## 2017-10-16 NOTE — Telephone Encounter (Signed)
sch appt spk to pt mld ltr 11/17/17 10am Carotid 11am f/u MD

## 2017-10-20 ENCOUNTER — Ambulatory Visit (INDEPENDENT_AMBULATORY_CARE_PROVIDER_SITE_OTHER): Payer: Medicare Other | Admitting: *Deleted

## 2017-10-20 DIAGNOSIS — I639 Cerebral infarction, unspecified: Secondary | ICD-10-CM

## 2017-10-21 ENCOUNTER — Other Ambulatory Visit: Payer: Self-pay

## 2017-10-21 DIAGNOSIS — Z992 Dependence on renal dialysis: Principal | ICD-10-CM

## 2017-10-21 DIAGNOSIS — N186 End stage renal disease: Secondary | ICD-10-CM

## 2017-10-21 NOTE — Progress Notes (Signed)
Carelink Summary Report / Loop Recorder 

## 2017-10-22 ENCOUNTER — Encounter: Payer: Self-pay | Admitting: Neurology

## 2017-10-22 ENCOUNTER — Ambulatory Visit: Payer: Medicare Other | Admitting: Neurology

## 2017-10-22 VITALS — BP 125/58 | HR 84 | Ht 68.0 in | Wt 166.6 lb

## 2017-10-22 DIAGNOSIS — I6521 Occlusion and stenosis of right carotid artery: Secondary | ICD-10-CM

## 2017-10-22 NOTE — Progress Notes (Signed)
[facility] [address] [place]. [place] [address] [phone]       OFFICE FOLLOW-UP NOTE  Robert [name]. Date of Birth:  20-Feb-1936 Medical Record Number:  [id number]   HPI: Initial visit  07/31/2016 ;[name] is a 82 year male seen today for first office follow-up visit following hospital admission for stroke in March 2018. Is a complaint by his son. History is obtained from the patient, son, review of Hospital medical records and have personally reviewed imaging films. Robert [name] is an 82 y.o. male who was LKN at "about 1:30 before taking a nap". On awakening, his voice was hoarse. He drove to his daughter's house; she felt that his speech was slurred and that he was having a stroke. She drove him to the [facility] ED where a Code Stroke was called.  At time of CT scan, the patient denied any weakness. He also denied vision changes. The patient stated that he has acid reflux but has not become hoarse from this in the past. LSN: 1:30 PM on 05/20/2016.tPA Given: No, out of IV tPA time window.NIHSS: 2 (mild dysarthria and left hand sensory impairment) MRI scan of the brain showed scattered right MCA punctate infarcts which are felt to be embolic. MRA of the brain showed moderate bilateral cavernous carotid atherosclerotic changes. Carotid Doppler showed 60-79% right ICA stenosis which appeared to her have progressed since previous carotid ultrasound from March 2017 when it was 40-59% stenosis. Lower extremity venous Dopplers were negative for DVT. Transthoracic echo showed normal ejection fraction. Patient had a loop interpretation from loop recorder which was previously placed in prior admission which did not show it to fibrillation. LDL cholesterol could not be calculated due to significantly elevated triglycerides of 749 mg percent. Urine drug screen was negative. Hemoglobin A1c was elevated at 8.3. Patient was previously on aspirin and Plavix was added for stroke  prevention. Patient underwent elective right carotid endarterectomy by Dr. Trula Slade for few weeks later which was uneventful. [facility] she'll get ongoing therapies. He is currently at home and getting home physical and occupation therapy. Is opting improvement in his speech and facial weakness but left hand weakness persist. He is tolerating aspirin and Plavix with minor bruising but no bleeding. His blood pressure is well controlled usually in today to 133/60. Is tolerating Zocor well without muscle aches or pains. His fasting sugars have all ranged in the 80-90 range mostly. He had surgery for creation of dialysis fistula in the right arm by Dr. Trula Slade a few weeks ago in anticipation for dialysis Update 10/22/2017 : he returns for follow-up after last visit a year ago. Patient has recently been started on dialysis and had AV fistula surgery and on the left forearm a week agofrom which he seems to be recovering well. He is had no recurrent stroke or TIA symptoms. He did undergo follow-up carotid ultrasound study done on 04/17/17 which showed 60-79% right carotid restenosis at the surgical site and 40-59% left ICA stenosis. The patient has not yet been diagnosed with A. Fib on the loop recorder monitoring. He is tolerating aspirin and Plavix well without bruising or bleeding. He states blood pressure is well controlled and today it is 125/58. His tolerating Zocor and Zetia well without muscle aches and pains and states his lipids are well controlled. He does have an upcoming appointment to see Dr. Trula Slade vascular surgeon and has a follow-up carotid ultrasound scheduled for  next month. He has no new complaints ROS:   14 system review of systems is positive for  fatigue,  Hearing loss, loss of vision, shortness of breath, memory loss and all other systems negative  PMH:  Past Medical History:  Diagnosis Date  . Allergy    year round  . Anemia   . Cancer (HCC)    PSA  ELEVATED  . Carotid artery occlusion   . Cataract   . Chronic kidney disease    DR MATTINGLY , Dialysis T/Th/Sa  . Depression   . Diabetes mellitus   . Dyspnea   . GERD (gastroesophageal reflux disease)   . Hearing deficit   . History of kidney stones   . Hyperlipidemia   . Hypertension   . Myocardial infarction ([place]) 2011  . Stroke (cerebrum) (HCC)    weakness in left hand  04/2015, 05/20/16  . Thyroid disease    hypothyroid    Social History:  Social History   Socioeconomic History  . Marital status: Married    Spouse name: Not on file  . Number of children: Not on file  . Years of education: Not on file  . Highest education level: Not on file  Occupational History  . Not on file  Social Needs  . Financial resource strain: Not on file  . Food insecurity:    Worry: Not on file    Inability: Not on file  . Transportation needs:    Medical: Not on file    Non-medical: Not on file  Tobacco Use  . Smoking status: Former Research scientist (life sciences)  . Smokeless tobacco: Never Used  Substance and Sexual Activity  . Alcohol use: Yes    Alcohol/week: 2.0 standard drinks    Types: 2 Glasses of wine per week    Comment: rarely  . Drug use: No  . Sexual activity: Not on file  Lifestyle  . Physical activity:    Days per week: Not on file    Minutes per session: Not on file  . Stress: Not on file  Relationships  . Social connections:    Talks on phone: Not on file    Gets together: Not on file    Attends religious service: Not on file    Active member of club or organization: Not on file    Attends meetings of clubs or organizations: Not on file    Relationship status: Not on file  . Intimate partner violence:    Fear of current or ex partner: Not on file    Emotionally abused: Not on file    Physically abused: Not on file    Forced sexual activity: Not on file  Other Topics Concern  . Not on file  Social History Narrative  . Not on file    Medications:   Current Outpatient  Medications on File Prior to Visit  Medication Sig Dispense Refill  . albuterol (PROVENTIL HFA;VENTOLIN HFA) 108 (90 Base) MCG/ACT inhaler Inhale 2 puffs into the lungs every 6 (six) hours as needed for wheezing or shortness of breath.    [name] aspirin 325 MG EC tablet Take 325 mg by mouth daily.    . B Complex-C-Folic Acid (DIALYVITE 144) 0.8 MG TABS Take 1 tablet by mouth daily.  3  . calcium acetate (PHOSLO) 667 MG capsule Take 667 mg by mouth 3 (three) times daily with meals.   5  . clopidogrel (PLAVIX) 75 MG tablet Take 1 tablet (75 mg total) by mouth daily. 30 tablet  1  . ezetimibe (ZETIA) 10 MG tablet Take 1 tablet (10 mg total) by mouth daily. 30 tablet 1  . fexofenadine (ALLEGRA) 180 MG tablet Take 180 mg by mouth daily.    . furosemide (LASIX) 40 MG tablet     . hydrALAZINE (APRESOLINE) 100 MG tablet Take 100 mg by mouth 2 (two) times daily. Does not take in AM on Tues, Thurs, Sat.    . insulin degludec (TRESIBA FLEXTOUCH) 100 UNIT/ML SOPN FlexTouch Pen Inject 15 Units into the skin as needed.     [name] levothyroxine (SYNTHROID, LEVOTHROID) 50 MCG tablet Take 50 mcg by mouth daily before breakfast.     . metoprolol succinate (TOPROL-XL) 50 MG 24 hr tablet Take 50 mg by mouth 2 (two) times daily. Does not take in am on Tues, Thurs, Sat.    . pantoprazole (PROTONIX) 40 MG tablet Take 40 mg by mouth daily.    . sertraline (ZOLOFT) 25 MG tablet Take 25 mg by mouth daily.    . simvastatin (ZOCOR) 40 MG tablet Take 1 tablet (40 mg total) by mouth daily at 6 PM. (Patient taking differently: Take 40 mg by mouth every evening. ) 30 tablet 0  . sodium bicarbonate 650 MG tablet Take 650 mg by mouth 2 (two) times daily.   5   No current facility-administered medications on file prior to visit.     Allergies:   Allergies  Allergen Reactions  . Lisinopril Other (See Comments)    "made me lose my voice"    Physical Exam General: well developed, well nourished elderly Caucasian male, seated, in no  evident distress Head: head normocephalic and atraumatic.  Neck: supple with no carotid or supraclavicular bruits Cardiovascular: regular rate and rhythm, no murmurs Musculoskeletal: no deformity Skin:  no rash/petichiae. Left forearm surgical scar from AV fistula surgery Vascular:  Normal pulses all extremities Vitals:   10/22/17 1144  BP: (!) 125/58  Pulse: 84   Neurologic Exam Mental Status: Awake and fully alert. Oriented to place and time. Recent and remote memory intact. Attention span, concentration and fund of knowledge appropriate. Mood and affect appropriate.  Cranial Nerves: Fundoscopic exam not done Pupils equal, briskly reactive to light. Extraocular movements full without nystagmus. Visual fields full to confrontation. Hearing intact. Facial sensation intact. Face, tongue, palate moves normally and symmetrically.  Motor: Normal bulk and tone. Normal strength in all tested extremity muscles except significant weakness of left grip and intrinsic hand muscles and wrist flexion and extension. Diminished fine finger movements on the left. Orbits right over left approximately.. Sensory.: slightly diminished intact to touch ,pinprick .position and vibratory sensation in left hand only.  Coordination: Rapid alternating movements normal in all extremities. Finger-to-nose and heel-to-shin performed accurately bilaterally. Gait and Station: Arises from chair without difficulty. Stance is normal. Gait demonstrates normal stride length and balance . Able to heel, toe and tandem walk without difficulty.  Reflexes: 1+ and symmetric. Toes downgoing.      ASSESSMENT: 83 year Caucasian male with a right MCA branch embolic infarcts from proximal right carotid stenosis status post carotid endarterectomy electively. He has mild residual left hand weakness. Vascular risk factors of diabetes, hypertension, hyperlipidemia and extracranial carotid  restenosis.    PLAN:  I had a long discussion with  the patient regarding his remote stroke and carotid surgery and restenosis and risk for recurrent strokes and answered questions. Continue carotid restenosis surveillance and keep scheduled appointment in Dr. Stephens Shire office on 11/17/17 for the same.  Continue aspirin and Plavix for stroke prevention and maintain strict control of hypertension with blood pressure goal below 130/90, lipids with LDL cholesterol goal below 70 mg percent and diabetes with him about an A1c goal below 6.5%. Return for follow-up with Janett Billow my nurse practitioner in one year or call earlier if necessary Greater than 50% of time during this 25 minute visit was spent on counseling,explanation of diagnosis of carotid restenosis, planning of further management, discussion with patient and family and coordination of care Antony Contras, MD  [facility] [address] [place], [place] [address]  Phone [phone] Fax [phone] Note: This document was prepared with digital dictation and possible smart phrase technology. Any transcriptional errors that result from this process are unintentional

## 2017-10-22 NOTE — Patient Instructions (Signed)
I had a long discussion with the patient regarding his remote stroke and carotid surgery and restenosis and risk for recurrent strokes and answered questions. Continue carotid restenosis surveillance and keep scheduled appointment in Dr. Stephens Shire office on 11/17/17 for the same. Continue aspirin and Plavix for stroke prevention and maintain strict control of hypertension with blood pressure goal below 130/90, lipids with LDL cholesterol goal below 70 mg percent and diabetes with him about an A1c goal below 6.5%. Return for follow-up with Janett Billow my nurse practitioner in one year or call earlier if necessary

## 2017-10-23 ENCOUNTER — Other Ambulatory Visit: Payer: Self-pay | Admitting: Internal Medicine

## 2017-10-30 LAB — CUP PACEART REMOTE DEVICE CHECK
Implantable Pulse Generator Implant Date: 20170321
MDC IDC SESS DTM: 20190725023927

## 2017-10-31 ENCOUNTER — Telehealth: Payer: Self-pay | Admitting: *Deleted

## 2017-10-31 ENCOUNTER — Ambulatory Visit (INDEPENDENT_AMBULATORY_CARE_PROVIDER_SITE_OTHER): Payer: Medicare Other | Admitting: *Deleted

## 2017-10-31 DIAGNOSIS — I639 Cerebral infarction, unspecified: Secondary | ICD-10-CM

## 2017-10-31 LAB — CUP PACEART INCLINIC DEVICE CHECK
Date Time Interrogation Session: 20190906144906
MDC IDC PG IMPLANT DT: 20170321

## 2017-10-31 NOTE — Progress Notes (Signed)
Loop check in clinic. Battery status: good. R-waves 0.89mV. No new symptom or tachy episodes. Pause and brady detection off since implant. 569 AF episodes--all available ECGs (and those previously reviewed via [facility]) are false, ECGs show SR w/frequent ectopy. Per JA, reprogrammed AF detection to less sensitive, AT/AF recording threshold to >=20min. Requested that patient send a manual Carelink transmission to update Carelink. Monthly summary reports and ROV with JA PRN.

## 2017-10-31 NOTE — Telephone Encounter (Signed)
Patient agreeable to appointment today at 1:30pm for ILR reprogramming per Dr. Jackalyn Lombard recommendations. He is aware of office address.

## 2017-11-04 ENCOUNTER — Other Ambulatory Visit: Payer: Self-pay | Admitting: Internal Medicine

## 2017-11-17 ENCOUNTER — Other Ambulatory Visit: Payer: Self-pay

## 2017-11-17 ENCOUNTER — Ambulatory Visit: Payer: Medicare Other | Admitting: Surgery

## 2017-11-17 ENCOUNTER — Encounter: Payer: Self-pay | Admitting: *Deleted

## 2017-11-17 ENCOUNTER — Encounter: Payer: Self-pay | Admitting: Surgery

## 2017-11-17 ENCOUNTER — Other Ambulatory Visit: Payer: Self-pay | Admitting: *Deleted

## 2017-11-17 ENCOUNTER — Ambulatory Visit (HOSPITAL_COMMUNITY)
Admission: RE | Admit: 2017-11-17 | Discharge: 2017-11-17 | Disposition: A | Discharge status: Home / Self Care | Payer: Medicare Other | Source: Ambulatory Visit | Attending: Surgery | Admitting: Surgery

## 2017-11-17 VITALS — BP 145/62 | HR 63 | Resp 20 | Ht 68.0 in | Wt 167.7 lb

## 2017-11-17 DIAGNOSIS — I6523 Occlusion and stenosis of bilateral carotid arteries: Secondary | ICD-10-CM | POA: Diagnosis not present

## 2017-11-17 DIAGNOSIS — E119 Type 2 diabetes mellitus without complications: Secondary | ICD-10-CM | POA: Diagnosis not present

## 2017-11-17 DIAGNOSIS — I6521 Occlusion and stenosis of right carotid artery: Secondary | ICD-10-CM

## 2017-11-17 DIAGNOSIS — I10 Essential (primary) hypertension: Secondary | ICD-10-CM | POA: Insufficient documentation

## 2017-11-17 DIAGNOSIS — Z87891 Personal history of nicotine dependence: Secondary | ICD-10-CM | POA: Diagnosis not present

## 2017-11-17 NOTE — H&P (View-Only) (Signed)
[facility]  Patient name: Robert [name]. MRN: [id number] DOB: 06/03/1935 Sex: male   REASON FOR VISIT:    Follow up  HISOTRY OF PRESENT ILLNESS:    Robert [name] is a 82 y.o. male who is status post right carotid endarterectomy with patch angioplasty on 06/14/2016 for symptomatic stenosis.  I have also performed a right radiocephalic fistula as well as elevation and branch ligation.  He went on to have angioplasty of the stenosis.  This ultimately failed and then he had a first stage left basilic vein fistula on 9/89/2119.  Today, he is here for follow-up of his carotid artery stenosis.  Following his endarterectomy the stenosis has been in the 60 to 79% category.  He remains asymptomatic.  The patient has recently started dialysis and has a right-sided catheter placed.   PAST MEDICAL HISTORY:   Past Medical History:  Diagnosis Date  . Allergy    year round  . Anemia   . Cancer (HCC)    PSA ELEVATED  . Carotid artery occlusion   . Cataract   . Chronic kidney disease    DR MATTINGLY , Dialysis T/Th/Sa  . Depression   . Diabetes mellitus   . Dyspnea   . GERD (gastroesophageal reflux disease)   . Hearing deficit   . History of kidney stones   . Hyperlipidemia   . Hypertension   . Myocardial infarction ([place]) 2011  . Stroke (cerebrum) (HCC)    weakness in left hand  04/2015, 05/20/16  . Thyroid disease    hypothyroid     FAMILY HISTORY:   Family History  Problem Relation Age of Onset  . Dementia Maternal Grandfather     SOCIAL HISTORY:   Social History   Tobacco Use  . Smoking status: Former Research scientist (life sciences)  . Smokeless tobacco: Never Used  Substance Use Topics  . Alcohol use: Yes    Alcohol/week: 2.0 standard drinks    Types: 2 Glasses of wine per week    Comment: rarely     ALLERGIES:   Allergies  Allergen Reactions  . Lisinopril Other (See Comments)    "made me lose my voice"      CURRENT MEDICATIONS:   Current Outpatient Medications  Medication Sig Dispense Refill  . albuterol (PROVENTIL HFA;VENTOLIN HFA) 108 (90 Base) MCG/ACT inhaler Inhale 2 puffs into the lungs every 6 (six) hours as needed for wheezing or shortness of breath.    [name] aspirin 325 MG EC tablet Take 325 mg by mouth daily.    . B Complex-C-Folic Acid (DIALYVITE 417) 0.8 MG TABS Take 1 tablet by mouth daily.  3  . calcium acetate (PHOSLO) 667 MG capsule Take 667 mg by mouth 3 (three) times daily with meals.   5  . clopidogrel (PLAVIX) 75 MG tablet Take 1 tablet (75 mg total) by mouth daily. 30 tablet 1  . ezetimibe (ZETIA) 10 MG tablet Take 1 tablet (10 mg total) by mouth daily. 30 tablet 1  . fexofenadine (ALLEGRA) 180 MG tablet Take 180 mg by mouth daily.    . furosemide (LASIX) 40 MG tablet     . hydrALAZINE (APRESOLINE) 100 MG tablet Take 100 mg by mouth 2 (two) times daily. Does not take in AM on Tues, Thurs, Sat.    . insulin degludec (TRESIBA FLEXTOUCH) 100 UNIT/ML SOPN FlexTouch Pen Inject 15 Units into the skin as needed.     [name] levothyroxine (SYNTHROID, LEVOTHROID) 50 MCG tablet Take  50 mcg by mouth daily before breakfast.     . metoprolol succinate (TOPROL-XL) 50 MG 24 hr tablet Take 50 mg by mouth 2 (two) times daily. Does not take in am on Tues, Thurs, Sat.    . pantoprazole (PROTONIX) 40 MG tablet Take 40 mg by mouth daily.    . sertraline (ZOLOFT) 25 MG tablet Take 25 mg by mouth daily.    . simvastatin (ZOCOR) 40 MG tablet Take 1 tablet (40 mg total) by mouth daily at 6 PM. (Patient taking differently: Take 40 mg by mouth every evening. ) 30 tablet 0  . sodium bicarbonate 650 MG tablet Take 650 mg by mouth 2 (two) times daily.   5   No current facility-administered medications for this visit.     REVIEW OF SYSTEMS:   [X]  denotes positive finding, [ ]  denotes negative finding Cardiac  Comments:  Chest pain or chest pressure:    Shortness of breath upon exertion:    Short of  breath when lying flat:    Irregular heart rhythm:        Vascular    Pain in calf, thigh, or hip brought on by ambulation:    Pain in feet at night that wakes you up from your sleep:     Blood clot in your veins:    Leg swelling:         Pulmonary    Oxygen at home:    Productive cough:     Wheezing:         Neurologic    Sudden weakness in arms or legs:     Sudden numbness in arms or legs:     Sudden onset of difficulty speaking or slurred speech:    Temporary loss of vision in one eye:     Problems with dizziness:         Gastrointestinal    Blood in stool:     Vomited blood:         Genitourinary    Burning when urinating:     Blood in urine:        Psychiatric    Major depression:         Hematologic    Bleeding problems:    Problems with blood clotting too easily:        Skin    Rashes or ulcers:        Constitutional    Fever or chills:      PHYSICAL EXAM:   Vitals:   11/17/17 1122 11/17/17 1123  BP: (!) 149/59 (!) 145/62  Pulse: 63   Resp: 20   SpO2: 99%   Weight: 167 lb 11.2 oz (76.1 kg)   Height: 5\' 8"  (1.727 m)     GENERAL: The patient is a well-nourished male, in no acute distress. The vital signs are documented above. CARDIAC: There is a regular rate and rhythm.  VASCULAR: Palpable thrill within fistula PULMONARY: Non-labored respirations  MUSCULOSKELETAL: There are no major deformities or cyanosis. NEUROLOGIC: No focal weakness or paresthesias are detected. SKIN: There are no ulcers or rashes noted. PSYCHIATRIC: The patient has a normal affect.  Fistula was evaluated with ultrasound.  The vein is widely patent.  It does narrowed near the anastomosis, however this does not appear to be stenotic.  STUDIES:   I have ordered and reviewed his carotid duplex which show 60-79% right-sided stenosis.  The left is 1-39%.  MEDICAL ISSUES:   Dialysis: The patient will be scheduled  for a second stage left basilic vein fistula on Wednesday,  October 9.  He will need to be off of his Plavix.  The risks and details of the operation were discussed with the patient.  Carotid: The patient stenosis remained stable at 60-79% in the right.  I have him come back for follow-up ultrasound in 6 months.  He remains asymptomatic.    [name], MD [facility] [phone] Pager [phone]

## 2017-11-17 NOTE — Progress Notes (Signed)
[facility] [place]  Patient name: Robert [name]. MRN: [id number] DOB: 14-Feb-1936 Sex: male   REASON FOR VISIT:    Follow up  HISOTRY OF PRESENT ILLNESS:    [name] is a 82 y.o. male who is status post right carotid endarterectomy with patch angioplasty on 06/14/2016 for symptomatic stenosis.  I have also performed a right radiocephalic fistula as well as elevation and branch ligation.  He went on to have angioplasty of the stenosis.  This ultimately failed and then he had a first stage left basilic vein fistula on 06/12/4079.  Today, he is here for follow-up of his carotid artery stenosis.  Following his endarterectomy the stenosis has been in the 60 to 79% category.  He remains asymptomatic.  The patient has recently started dialysis and has a right-sided catheter placed.   PAST MEDICAL HISTORY:   Past Medical History:  Diagnosis Date  . Allergy    year round  . Anemia   . Cancer (HCC)    PSA ELEVATED  . Carotid artery occlusion   . Cataract   . Chronic kidney disease    DR MATTINGLY , Dialysis T/Th/Sa  . Depression   . Diabetes mellitus   . Dyspnea   . GERD (gastroesophageal reflux disease)   . Hearing deficit   . History of kidney stones   . Hyperlipidemia   . Hypertension   . Myocardial infarction ([place]) 2011  . Stroke (cerebrum) (HCC)    weakness in left hand  04/2015, 05/20/16  . Thyroid disease    hypothyroid     FAMILY HISTORY:   Family History  Problem Relation Age of Onset  . Dementia Maternal Grandfather     SOCIAL HISTORY:   Social History   Tobacco Use  . Smoking status: Former Research scientist (life sciences)  . Smokeless tobacco: Never Used  Substance Use Topics  . Alcohol use: Yes    Alcohol/week: 2.0 standard drinks    Types: 2 Glasses of wine per week    Comment: rarely     ALLERGIES:   Allergies  Allergen Reactions  . Lisinopril Other (See Comments)    "made me lose my voice"      CURRENT MEDICATIONS:   Current Outpatient Medications  Medication Sig Dispense Refill  . albuterol (PROVENTIL HFA;VENTOLIN HFA) 108 (90 Base) MCG/ACT inhaler Inhale 2 puffs into the lungs every 6 (six) hours as needed for wheezing or shortness of breath.    [name] aspirin 325 MG EC tablet Take 325 mg by mouth daily.    . B Complex-C-Folic Acid (DIALYVITE 448) 0.8 MG TABS Take 1 tablet by mouth daily.  3  . calcium acetate (PHOSLO) 667 MG capsule Take 667 mg by mouth 3 (three) times daily with meals.   5  . clopidogrel (PLAVIX) 75 MG tablet Take 1 tablet (75 mg total) by mouth daily. 30 tablet 1  . ezetimibe (ZETIA) 10 MG tablet Take 1 tablet (10 mg total) by mouth daily. 30 tablet 1  . fexofenadine (ALLEGRA) 180 MG tablet Take 180 mg by mouth daily.    . furosemide (LASIX) 40 MG tablet     . hydrALAZINE (APRESOLINE) 100 MG tablet Take 100 mg by mouth 2 (two) times daily. Does not take in AM on Tues, Thurs, Sat.    . insulin degludec (TRESIBA FLEXTOUCH) 100 UNIT/ML SOPN FlexTouch Pen Inject 15 Units into the skin as needed.     [name] levothyroxine (SYNTHROID, LEVOTHROID) 50 MCG tablet Take  50 mcg by mouth daily before breakfast.     . metoprolol succinate (TOPROL-XL) 50 MG 24 hr tablet Take 50 mg by mouth 2 (two) times daily. Does not take in am on Tues, Thurs, Sat.    . pantoprazole (PROTONIX) 40 MG tablet Take 40 mg by mouth daily.    . sertraline (ZOLOFT) 25 MG tablet Take 25 mg by mouth daily.    . simvastatin (ZOCOR) 40 MG tablet Take 1 tablet (40 mg total) by mouth daily at 6 PM. (Patient taking differently: Take 40 mg by mouth every evening. ) 30 tablet 0  . sodium bicarbonate 650 MG tablet Take 650 mg by mouth 2 (two) times daily.   5   No current facility-administered medications for this visit.     REVIEW OF SYSTEMS:   [X]  denotes positive finding, [ ]  denotes negative finding Cardiac  Comments:  Chest pain or chest pressure:    Shortness of breath upon exertion:    Short of  breath when lying flat:    Irregular heart rhythm:        Vascular    Pain in calf, thigh, or hip brought on by ambulation:    Pain in feet at night that wakes you up from your sleep:     Blood clot in your veins:    Leg swelling:         Pulmonary    Oxygen at home:    Productive cough:     Wheezing:         Neurologic    Sudden weakness in arms or legs:     Sudden numbness in arms or legs:     Sudden onset of difficulty speaking or slurred speech:    Temporary loss of vision in one eye:     Problems with dizziness:         Gastrointestinal    Blood in stool:     Vomited blood:         Genitourinary    Burning when urinating:     Blood in urine:        Psychiatric    Major depression:         Hematologic    Bleeding problems:    Problems with blood clotting too easily:        Skin    Rashes or ulcers:        Constitutional    Fever or chills:      PHYSICAL EXAM:   Vitals:   11/17/17 1122 11/17/17 1123  BP: (!) 149/59 (!) 145/62  Pulse: 63   Resp: 20   SpO2: 99%   Weight: 167 lb 11.2 oz (76.1 kg)   Height: 5\' 8"  (1.727 m)     GENERAL: The patient is a well-nourished male, in no acute distress. The vital signs are documented above. CARDIAC: There is a regular rate and rhythm.  VASCULAR: Palpable thrill within fistula PULMONARY: Non-labored respirations  MUSCULOSKELETAL: There are no major deformities or cyanosis. NEUROLOGIC: No focal weakness or paresthesias are detected. SKIN: There are no ulcers or rashes noted. PSYCHIATRIC: The patient has a normal affect.  Fistula was evaluated with ultrasound.  The vein is widely patent.  It does narrowed near the anastomosis, however this does not appear to be stenotic.  STUDIES:   I have ordered and reviewed his carotid duplex which show 60-79% right-sided stenosis.  The left is 1-39%.  MEDICAL ISSUES:   Dialysis: The patient will be scheduled  for a second stage left basilic vein fistula on Wednesday,  October 9.  He will need to be off of his Plavix.  The risks and details of the operation were discussed with the patient.  Carotid: The patient stenosis remained stable at 60-79% in the right.  I have him come back for follow-up ultrasound in 6 months.  He remains asymptomatic.    [name], MD [facility] [phone] Pager [phone]

## 2017-11-18 LAB — CUP PACEART REMOTE DEVICE CHECK
Date Time Interrogation Session: 20190827041138
MDC IDC PG IMPLANT DT: 20170321

## 2017-11-20 ENCOUNTER — Telehealth: Payer: Self-pay | Admitting: *Deleted

## 2017-11-20 NOTE — Telephone Encounter (Signed)
Call to daughter. Instructed time change to arrive at hospital at 11:30 am for surgery on 12/03/17. Reminded to hold Plavix x 5 days prior to surgery, caregiver and driver for discharge to home. Expect call with instructions from hospital pre-admission.

## 2017-11-24 ENCOUNTER — Ambulatory Visit (INDEPENDENT_AMBULATORY_CARE_PROVIDER_SITE_OTHER): Payer: Medicare Other | Admitting: *Deleted

## 2017-11-24 DIAGNOSIS — I639 Cerebral infarction, unspecified: Secondary | ICD-10-CM

## 2017-11-24 NOTE — Progress Notes (Signed)
Carelink Summary Report / Loop Recorder 

## 2017-11-25 LAB — CUP PACEART REMOTE DEVICE CHECK
Date Time Interrogation Session: 20190929084147
MDC IDC PG IMPLANT DT: 20170321

## 2017-11-27 ENCOUNTER — Other Ambulatory Visit: Payer: Self-pay | Admitting: Internal Medicine

## 2017-11-28 ENCOUNTER — Other Ambulatory Visit: Payer: Self-pay | Admitting: *Deleted

## 2017-12-01 ENCOUNTER — Encounter (HOSPITAL_COMMUNITY): Payer: Medicare Other

## 2017-12-01 ENCOUNTER — Telehealth: Payer: Self-pay | Admitting: *Deleted

## 2017-12-01 ENCOUNTER — Encounter: Payer: Medicare Other | Admitting: Surgery

## 2017-12-01 NOTE — Telephone Encounter (Signed)
Surgery date and time changed to 12/10/17 arrive at hospital addmitting at 6:30 am. Resume Plavix today and hold after 10/10 dose for 10/16 surgery. Verbalized understanding and will call office if questions. Spoke with patient's daughter Mickel Baas.

## 2017-12-09 ENCOUNTER — Encounter (HOSPITAL_COMMUNITY): Payer: Self-pay | Admitting: *Deleted

## 2017-12-09 ENCOUNTER — Other Ambulatory Visit: Payer: Self-pay

## 2017-12-09 NOTE — Anesthesia Preprocedure Evaluation (Addendum)
Anesthesia Evaluation  Patient identified by MRN, date of birth, ID band Patient awake    Reviewed: Allergy & Precautions, NPO status , Patient's Chart, lab work & pertinent test results  Airway Mallampati: II  TM Distance: >3 FB Neck ROM: Full    Dental  (+) Teeth Intact, Dental Advisory Given   Pulmonary former smoker,    breath sounds clear to auscultation       Cardiovascular hypertension, Pt. on medications and Pt. on home beta blockers + Past MI, + Cardiac Stents and +CHF   Rhythm:Regular Rate:Normal     Neuro/Psych Depression  Neuromuscular disease CVA    GI/Hepatic GERD  Medicated,  Endo/Other  diabetes, Type 2, Insulin DependentHypothyroidism   Renal/GU ESRF and DialysisRenal disease     Musculoskeletal   Abdominal Normal abdominal exam  (+)   Peds  Hematology   Anesthesia Other Findings   Reproductive/Obstetrics                            Lab Results  Component Value Date   WBC 9.9 06/07/2016   HGB 9.5 (L) 10/15/2017   HCT 28.0 (L) 10/15/2017   MCV 87.3 06/07/2016   PLT 134 (L) 06/07/2016   Lab Results  Component Value Date   CREATININE 5.90 (H) 06/24/2017   BUN 80 (H) 06/24/2017   NA 141 10/15/2017   K 2.8 (L) 10/15/2017   CL 108 06/24/2017   CO2 22 06/10/2016     Anesthesia Physical Anesthesia Plan  ASA: III  Anesthesia Plan: MAC   Post-op Pain Management:    Induction: Intravenous  PONV Risk Score and Plan: 2 and Ondansetron and Propofol infusion  Airway Management Planned: Simple Face Mask and Natural Airway  Additional Equipment: None  Intra-op Plan:   Post-operative Plan:   Informed Consent: I have reviewed the patients History and Physical, chart, labs and discussed the procedure including the risks, benefits and alternatives for the proposed anesthesia with the patient or authorized representative who has indicated his/her understanding and  acceptance.   Dental advisory given  Plan Discussed with: CRNA  Anesthesia Plan Comments:        Anesthesia Quick Evaluation

## 2017-12-09 NOTE — Progress Notes (Signed)
Robert [name] denies chest pain or shortness of breath.  Robert [name] has Type II diabetes, he reports that he doesn't take [place] as ordered, "I take if if I remember.  Patient states that his daughter puts medication in medication containers. I instructed patient if he can take the following medications out of the prescription bottles to that them: Aspirin, Metoprolol, Hydralazine, Levothyroxine. I instructed Robert [name] to take 8 units of Tresiba - tonight.  I instructed patient to check CBG after awaking and every 2 hours until arrival  to the hospital.  I Instructed patient if CBG is less than 70 to drink  or 1/2 cup of a clear juice. Recheck CBG in 15 minutes then call pre- op desk at [phone] for further instructions.

## 2017-12-10 ENCOUNTER — Encounter (HOSPITAL_COMMUNITY): Payer: Self-pay

## 2017-12-10 ENCOUNTER — Telehealth: Payer: Self-pay | Admitting: Surgery

## 2017-12-10 ENCOUNTER — Ambulatory Visit (HOSPITAL_COMMUNITY): Payer: Medicare Other | Admitting: Anesthesiology

## 2017-12-10 ENCOUNTER — Ambulatory Visit (HOSPITAL_COMMUNITY)
Admission: RE | Admit: 2017-12-10 | Discharge: 2017-12-10 | Disposition: A | Discharge status: Home / Self Care | Payer: Medicare Other | Source: Ambulatory Visit | Attending: Surgery | Admitting: Surgery

## 2017-12-10 ENCOUNTER — Encounter (HOSPITAL_COMMUNITY)
Admission: RE | Disposition: A | Discharge status: Home / Self Care | Payer: Self-pay | Source: Ambulatory Visit | Attending: Surgery

## 2017-12-10 DIAGNOSIS — I6521 Occlusion and stenosis of right carotid artery: Secondary | ICD-10-CM | POA: Diagnosis not present

## 2017-12-10 DIAGNOSIS — E1122 Type 2 diabetes mellitus with diabetic chronic kidney disease: Secondary | ICD-10-CM | POA: Insufficient documentation

## 2017-12-10 DIAGNOSIS — E785 Hyperlipidemia, unspecified: Secondary | ICD-10-CM | POA: Insufficient documentation

## 2017-12-10 DIAGNOSIS — Z87891 Personal history of nicotine dependence: Secondary | ICD-10-CM | POA: Diagnosis not present

## 2017-12-10 DIAGNOSIS — F329 Major depressive disorder, single episode, unspecified: Secondary | ICD-10-CM | POA: Diagnosis not present

## 2017-12-10 DIAGNOSIS — I252 Old myocardial infarction: Secondary | ICD-10-CM | POA: Diagnosis not present

## 2017-12-10 DIAGNOSIS — Z7902 Long term (current) use of antithrombotics/antiplatelets: Secondary | ICD-10-CM | POA: Diagnosis not present

## 2017-12-10 DIAGNOSIS — I509 Heart failure, unspecified: Secondary | ICD-10-CM | POA: Diagnosis not present

## 2017-12-10 DIAGNOSIS — I132 Hypertensive heart and chronic kidney disease with heart failure and with stage 5 chronic kidney disease, or end stage renal disease: Secondary | ICD-10-CM | POA: Insufficient documentation

## 2017-12-10 DIAGNOSIS — N186 End stage renal disease: Secondary | ICD-10-CM | POA: Insufficient documentation

## 2017-12-10 DIAGNOSIS — I69398 Other sequelae of cerebral infarction: Secondary | ICD-10-CM | POA: Insufficient documentation

## 2017-12-10 DIAGNOSIS — R531 Weakness: Secondary | ICD-10-CM | POA: Insufficient documentation

## 2017-12-10 DIAGNOSIS — Z794 Long term (current) use of insulin: Secondary | ICD-10-CM | POA: Insufficient documentation

## 2017-12-10 DIAGNOSIS — D631 Anemia in chronic kidney disease: Secondary | ICD-10-CM | POA: Insufficient documentation

## 2017-12-10 DIAGNOSIS — Z992 Dependence on renal dialysis: Secondary | ICD-10-CM | POA: Diagnosis not present

## 2017-12-10 DIAGNOSIS — Z79899 Other long term (current) drug therapy: Secondary | ICD-10-CM | POA: Diagnosis not present

## 2017-12-10 DIAGNOSIS — Z7982 Long term (current) use of aspirin: Secondary | ICD-10-CM | POA: Insufficient documentation

## 2017-12-10 DIAGNOSIS — K219 Gastro-esophageal reflux disease without esophagitis: Secondary | ICD-10-CM | POA: Diagnosis not present

## 2017-12-10 DIAGNOSIS — Z79890 Hormone replacement therapy (postmenopausal): Secondary | ICD-10-CM | POA: Insufficient documentation

## 2017-12-10 DIAGNOSIS — N185 Chronic kidney disease, stage 5: Secondary | ICD-10-CM | POA: Diagnosis not present

## 2017-12-10 DIAGNOSIS — E039 Hypothyroidism, unspecified: Secondary | ICD-10-CM | POA: Insufficient documentation

## 2017-12-10 HISTORY — PX: BASCILIC VEIN TRANSPOSITION: SHX5742

## 2017-12-10 HISTORY — DX: End stage renal disease: N18.6

## 2017-12-10 HISTORY — DX: Hypothyroidism, unspecified: E03.9

## 2017-12-10 LAB — POCT I-STAT 4, (NA,K, GLUC, HGB,HCT)
GLUCOSE: 142 mg/dL — AB (ref 70–99)
HCT: 27 % — ABNORMAL LOW (ref 39.0–52.0)
Hemoglobin: 9.2 g/dL — ABNORMAL LOW (ref 13.0–17.0)
POTASSIUM: 3.8 mmol/L (ref 3.5–5.1)
Sodium: 141 mmol/L (ref 135–145)

## 2017-12-10 LAB — GLUCOSE, CAPILLARY: Glucose-Capillary: 119 mg/dL — ABNORMAL HIGH (ref 70–99)

## 2017-12-10 LAB — HEMOGLOBIN A1C
Hgb A1c MFr Bld: 7.1 % — ABNORMAL HIGH (ref 4.8–5.6)
Mean Plasma Glucose: 157.07 mg/dL

## 2017-12-10 SURGERY — TRANSPOSITION, VEIN, BASILIC
Anesthesia: Monitor Anesthesia Care | Site: Arm Upper | Laterality: Left

## 2017-12-10 MED ORDER — SODIUM CHLORIDE 0.9 % IV SOLN
INTRAVENOUS | Status: DC | PRN
  Administered 2017-12-10: 30 ug/min via INTRAVENOUS

## 2017-12-10 MED ORDER — SODIUM CHLORIDE 0.9 % IV SOLN
INTRAVENOUS | Status: AC
  Filled 2017-12-10: qty 1.2

## 2017-12-10 MED ORDER — OXYCODONE-ACETAMINOPHEN 5-325 MG PO TABS: 1.0000 | ORAL_TABLET | Freq: Four times a day (QID) | ORAL | 0 refills | Status: DC | PRN

## 2017-12-10 MED ORDER — SODIUM CHLORIDE 0.9 % IV SOLN
INTRAVENOUS | Status: DC
  Administered 2017-12-10: 08:00:00 via INTRAVENOUS

## 2017-12-10 MED ORDER — MEPERIDINE HCL 50 MG/ML IJ SOLN: 6.2500 mg | INTRAMUSCULAR | Status: DC | PRN

## 2017-12-10 MED ORDER — CHLORHEXIDINE GLUCONATE 4 % EX LIQD: 60.0000 mL | Freq: Once | CUTANEOUS | Status: DC

## 2017-12-10 MED ORDER — HEMOSTATIC AGENTS (NO CHARGE) OPTIME
TOPICAL | Status: DC | PRN
  Administered 2017-12-10: 1 via TOPICAL

## 2017-12-10 MED ORDER — 0.9 % SODIUM CHLORIDE (POUR BTL) OPTIME
TOPICAL | Status: DC | PRN
  Administered 2017-12-10: 1000 mL

## 2017-12-10 MED ORDER — ONDANSETRON HCL 4 MG/2ML IJ SOLN
INTRAMUSCULAR | Status: DC | PRN
  Administered 2017-12-10: 4 mg via INTRAVENOUS

## 2017-12-10 MED ORDER — LIDOCAINE-EPINEPHRINE (PF) 1 %-1:200000 IJ SOLN
INTRAMUSCULAR | Status: AC
  Filled 2017-12-10: qty 30

## 2017-12-10 MED ORDER — LIDOCAINE 2% (20 MG/ML) 5 ML SYRINGE
INTRAMUSCULAR | Status: AC
  Filled 2017-12-10: qty 5

## 2017-12-10 MED ORDER — PROTAMINE SULFATE 10 MG/ML IV SOLN
INTRAVENOUS | Status: AC
  Filled 2017-12-10: qty 5

## 2017-12-10 MED ORDER — ONDANSETRON HCL 4 MG/2ML IJ SOLN
INTRAMUSCULAR | Status: AC
  Filled 2017-12-10: qty 2

## 2017-12-10 MED ORDER — FENTANYL CITRATE (PF) 100 MCG/2ML IJ SOLN
INTRAMUSCULAR | Status: DC | PRN
  Administered 2017-12-10 (×3): 25 ug via INTRAVENOUS

## 2017-12-10 MED ORDER — LACTATED RINGERS IV SOLN: INTRAVENOUS | Status: DC

## 2017-12-10 MED ORDER — HEPARIN SODIUM (PORCINE) 1000 UNIT/ML IJ SOLN
INTRAMUSCULAR | Status: DC | PRN
  Administered 2017-12-10: 3000 [IU] via INTRAVENOUS

## 2017-12-10 MED ORDER — FENTANYL CITRATE (PF) 250 MCG/5ML IJ SOLN
INTRAMUSCULAR | Status: AC
  Filled 2017-12-10: qty 5

## 2017-12-10 MED ORDER — PROPOFOL 10 MG/ML IV BOLUS
INTRAVENOUS | Status: DC | PRN
  Administered 2017-12-10: 20 mg via INTRAVENOUS

## 2017-12-10 MED ORDER — SODIUM CHLORIDE 0.9 % IV SOLN
INTRAVENOUS | Status: DC | PRN
  Administered 2017-12-10: 500 mL

## 2017-12-10 MED ORDER — PROMETHAZINE HCL 25 MG/ML IJ SOLN: 6.2500 mg | INTRAMUSCULAR | Status: DC | PRN

## 2017-12-10 MED ORDER — FENTANYL CITRATE (PF) 100 MCG/2ML IJ SOLN: 25.0000 ug | INTRAMUSCULAR | Status: DC | PRN

## 2017-12-10 MED ORDER — LIDOCAINE HCL (PF) 1 % IJ SOLN
INTRAMUSCULAR | Status: AC
  Filled 2017-12-10: qty 30

## 2017-12-10 MED ORDER — HEPARIN SODIUM (PORCINE) 1000 UNIT/ML IJ SOLN
INTRAMUSCULAR | Status: AC
  Filled 2017-12-10: qty 1

## 2017-12-10 MED ORDER — LIDOCAINE HCL (CARDIAC) PF 100 MG/5ML IV SOSY
PREFILLED_SYRINGE | INTRAVENOUS | Status: DC | PRN
  Administered 2017-12-10: 40 mg via INTRAVENOUS

## 2017-12-10 MED ORDER — PROPOFOL 500 MG/50ML IV EMUL
INTRAVENOUS | Status: DC | PRN
  Administered 2017-12-10: 75 ug/kg/min via INTRAVENOUS

## 2017-12-10 MED ORDER — LIDOCAINE-EPINEPHRINE (PF) 1 %-1:200000 IJ SOLN
INTRAMUSCULAR | Status: DC | PRN
  Administered 2017-12-10: 25 mL

## 2017-12-10 MED ORDER — PROTAMINE SULFATE 10 MG/ML IV SOLN
INTRAVENOUS | Status: DC | PRN
  Administered 2017-12-10: 25 mg via INTRAVENOUS

## 2017-12-10 MED ORDER — CEFAZOLIN SODIUM-DEXTROSE 2-4 GM/100ML-% IV SOLN
2.0000 g | INTRAVENOUS | Status: AC
  Administered 2017-12-10: 2 g via INTRAVENOUS

## 2017-12-10 MED ORDER — CEFAZOLIN SODIUM-DEXTROSE 2-4 GM/100ML-% IV SOLN
INTRAVENOUS | Status: AC
  Filled 2017-12-10: qty 100

## 2017-12-10 SURGICAL SUPPLY — 35 items
ARMBAND PINK RESTRICT EXTREMIT (MISCELLANEOUS) ×3 IMPLANT
CANISTER SUCT 3000ML PPV (MISCELLANEOUS) ×3 IMPLANT
CLIP VESOCCLUDE MED 24/CT (CLIP) IMPLANT
CLIP VESOCCLUDE MED 6/CT (CLIP) IMPLANT
CLIP VESOCCLUDE SM WIDE 24/CT (CLIP) IMPLANT
CLIP VESOCCLUDE SM WIDE 6/CT (CLIP) IMPLANT
COVER PROBE W GEL 5X96 (DRAPES) ×3 IMPLANT
COVER WAND RF STERILE (DRAPES) ×3 IMPLANT
DERMABOND ADVANCED (GAUZE/BANDAGES/DRESSINGS) ×2
DERMABOND ADVANCED .7 DNX12 (GAUZE/BANDAGES/DRESSINGS) ×1 IMPLANT
ELECT REM PT RETURN 9FT ADLT (ELECTROSURGICAL) ×3
ELECTRODE REM PT RTRN 9FT ADLT (ELECTROSURGICAL) ×1 IMPLANT
GLOVE BIOGEL PI IND STRL 7.5 (GLOVE) ×1 IMPLANT
GLOVE BIOGEL PI INDICATOR 7.5 (GLOVE) ×2
GLOVE SURG SS PI 7.5 STRL IVOR (GLOVE) ×3 IMPLANT
GOWN STRL REUS W/ TWL LRG LVL3 (GOWN DISPOSABLE) ×2 IMPLANT
GOWN STRL REUS W/ TWL XL LVL3 (GOWN DISPOSABLE) ×1 IMPLANT
GOWN STRL REUS W/TWL LRG LVL3 (GOWN DISPOSABLE) ×4
GOWN STRL REUS W/TWL XL LVL3 (GOWN DISPOSABLE) ×2
HEMOSTAT SNOW SURGICEL 2X4 (HEMOSTASIS) IMPLANT
KIT BASIN OR (CUSTOM PROCEDURE TRAY) ×3 IMPLANT
KIT TURNOVER KIT B (KITS) ×3 IMPLANT
NS IRRIG 1000ML POUR BTL (IV SOLUTION) ×3 IMPLANT
PACK CV ACCESS (CUSTOM PROCEDURE TRAY) ×3 IMPLANT
PAD ARMBOARD 7.5X6 YLW CONV (MISCELLANEOUS) ×6 IMPLANT
SPONGE LAP 18X18 RF (DISPOSABLE) ×3 IMPLANT
SUT MNCRL AB 4-0 PS2 18 (SUTURE) ×3 IMPLANT
SUT PROLENE 6 0 CC (SUTURE) ×9 IMPLANT
SUT SILK 2 0 SH (SUTURE) IMPLANT
SUT VIC AB 3-0 SH 27 (SUTURE) ×4
SUT VIC AB 3-0 SH 27X BRD (SUTURE) ×2 IMPLANT
SUT VICRYL 4-0 PS2 18IN ABS (SUTURE) ×3 IMPLANT
TOWEL GREEN STERILE (TOWEL DISPOSABLE) ×3 IMPLANT
UNDERPAD 30X30 (UNDERPADS AND DIAPERS) ×3 IMPLANT
WATER STERILE IRR 1000ML POUR (IV SOLUTION) ×3 IMPLANT

## 2017-12-10 NOTE — Op Note (Signed)
    Patient name: Robert [name]. MRN: [id number] DOB: [birth year] Sex: male  12/10/2017 Pre-operative Diagnosis: ESRD Post-operative diagnosis:  Same Surgeon:  [name] Assistants:  Laurence Slate Procedure:   Second stage left basilic vein fistula Anesthesia: MAC Blood Loss: Minimal Specimens: None  Findings: The basilic vein branch that was used for the anastomosis was small in caliber, therefore I ligated the fistula and created a new arteriotomy.  The vein was of excellent caliber measuring 5-8 mm  Indications: The patient has previously undergone a first stage left basilic vein fistula creation.  This is matured nicely.  He comes in today for second stage.  Procedure:  The patient was identified in the holding area and taken to [facility] [address]  The patient was then placed supine on the table. MAC anesthesia was administered.  The patient was prepped and draped in the usual sterile fashion.  A time out was called and antibiotics were administered.  1% lidocaine was used for local anesthesia.  After evaluating the basilic vein with ultrasound, I made 2 longitudinal incisions from the antecubital crease up towards the axilla.  Through this incision I dissected out the basilic vein.  Multiple side branches were ligated between silk ties.  The associated nerve was separated from the vein.  I had used the median cubital branch for the arterial venous anastomosis.  I felt that the vein over this area was too small and therefore I elected to ligated this near the arterial venous anastomosis with plans for creation of a new arteriotomy.  I dissected out the brachial artery just slightly proximal to the previous anastomosis.  I then created a subcutaneous tunnel.  The vein was marked for orientation.  It was then brought through the previously created tunnel making sure to maintain proper orientation.  3000 units of heparin was given.  I then occluded the brachial artery with vascular clamps.  A #11  blade was used to make an arteriotomy was extended longitudinally with Potts scissors.  The vein was then cut to the appropriate length and spatulated to fit the size of the arteriotomy.  A running anastomosis was created with 6-0 Prolene.  Prior to completion the appropriate flushing maneuvers were performed and the anastomosis was completed.  Clamps were released.  There was an excellent thrill within the fistula.  The patient had a biphasic radial artery Doppler signal.  25 mg of protamine was given.  The wound was then irrigated.  After hemostasis was satisfactory, the incisions were closed with 2 layers of 3-0 Vicryl followed by Dermabond.  There were no immediate complications.   Disposition: To PACU stable   V. [name], M.D. [facility] [place] Office: [phone] Pager:  [phone]

## 2017-12-10 NOTE — Telephone Encounter (Signed)
sch appt spk to pt 12/22/17 3pm p/o PA

## 2017-12-10 NOTE — Transfer of Care (Signed)
Immediate Anesthesia Transfer of Care Note  Patient: Robert [name].  Procedure(s) Performed: SECOND STAGE BASILIC VEIN TRANSPOSITION LEFT ARM (Left Arm Upper)  Patient Location: PACU  Anesthesia Type:MAC  Level of Consciousness: awake and sedated  Airway & Oxygen Therapy: Patient Spontanous Breathing and Patient connected to nasal cannula oxygen  Post-op Assessment: Report given to RN, Post -op Vital signs reviewed and stable and Patient moving all extremities  Post vital signs: Reviewed and stable  Last Vitals:  Vitals Value Taken Time  BP 96/44 12/10/2017 11:04 AM  Temp 36.6 C 12/10/2017 11:02 AM  Pulse 56 12/10/2017 11:07 AM  Resp 13 12/10/2017 11:07 AM  SpO2 99 % 12/10/2017 11:07 AM  Vitals shown include unvalidated device data.  Last Pain:  Vitals:   12/10/17 0749  TempSrc:   PainSc: 0-No pain      Patients Stated Pain Goal: 3 (50/38/88 2800)  Complications: No apparent anesthesia complications

## 2017-12-10 NOTE — Telephone Encounter (Signed)
-----   Message from Ulyses Amor, [place] sent at 12/10/2017 11:00 AM EDT -----  S/p second stage fistula f/u in [facility] clinic in 2 weeks

## 2017-12-10 NOTE — Discharge Instructions (Signed)
° °  [facility] [place] ° °Discharge Instructions ° °AV Fistula or Graft Surgery for Dialysis Access ° °Please refer to the following instructions for your post-procedure care. Your surgeon or physician assistant will discuss any changes with you. ° °Activity ° °You may drive the day following your surgery, if you are comfortable and no longer taking prescription pain medication. Resume full activity as the soreness in your incision resolves. ° °Bathing/Showering ° °You may shower after you go home. Keep your incision dry for 48 hours. Do not soak in a bathtub, hot tub, or swim until the incision heals completely. You may not shower if you have a hemodialysis catheter. ° °Incision Care ° °Clean your incision with mild soap and water after 48 hours. Pat the area dry with a clean towel. You do not need a bandage unless otherwise instructed. Do not apply any ointments or creams to your incision. You may have skin glue on your incision. Do not peel it off. It will come off on its own in about one week. Your arm may swell a bit after surgery. To reduce swelling use pillows to elevate your arm so it is above your heart. Your doctor will tell you if you need to lightly wrap your arm with an ACE bandage. ° °Diet ° °Resume your normal diet. There are not special food restrictions following this procedure. In order to heal from your surgery, it is CRITICAL to get adequate nutrition. Your body requires vitamins, minerals, and protein. Vegetables are the best source of vitamins and minerals. Vegetables also provide the perfect balance of protein. Processed food has little nutritional value, so try to avoid this. ° °Medications ° °Resume taking all of your medications. If your incision is causing pain, you may take over-the counter pain relievers such as acetaminophen (Tylenol). If you were prescribed a stronger pain medication, please be aware these medications can cause nausea and constipation. Prevent  nausea by taking the medication with a snack or meal. Avoid constipation by drinking plenty of fluids and eating foods with high amount of fiber, such as fruits, vegetables, and grains. Do not take Tylenol if you are taking prescription pain medications. ° ° ° ° °Follow up °Your surgeon may want to see you in the office following your access surgery. If so, this will be arranged at the time of your surgery. ° °Please call us immediately for any of the following conditions: ° °Increased pain, redness, drainage (pus) from your incision site °Fever of 101 degrees or higher °Severe or worsening pain at your incision site °Hand pain or numbness. ° °Reduce your risk of vascular disease: ° °Stop smoking. If you would like help, call [facility] at 1-800-QUIT-NOW (1-800-784-8669) or [facility] at [phone] ° °Manage your cholesterol °Maintain a desired weight °Control your diabetes °Keep your blood pressure down ° °Dialysis ° °It will take several weeks to several months for your new dialysis access to be ready for use. Your surgeon will determine when it is OK to use it. Your nephrologist will continue to direct your dialysis. You can continue to use your Permcath until your new access is ready for use. ° °If you have any questions, please call the office at [phone]. ° °

## 2017-12-10 NOTE — Anesthesia Postprocedure Evaluation (Signed)
Anesthesia Post Note  Patient: Zymire Turnbo.  Procedure(s) Performed: SECOND STAGE BASILIC VEIN TRANSPOSITION LEFT ARM (Left Arm Upper)     Patient location during evaluation: PACU Anesthesia Type: MAC Level of consciousness: awake and alert Pain management: pain level controlled Vital Signs Assessment: post-procedure vital signs reviewed and stable Respiratory status: spontaneous breathing, nonlabored ventilation, respiratory function stable and patient connected to nasal cannula oxygen Cardiovascular status: stable and blood pressure returned to baseline Postop Assessment: no apparent nausea or vomiting Anesthetic complications: no    Last Vitals:  Vitals:   12/10/17 1131 12/10/17 1136  BP: (!) 106/50 (!) 112/49  Pulse: (!) 57 (!) 57  Resp: 15 16  Temp: 36.6 C   SpO2: 92% 92%    Last Pain:  Vitals:   12/10/17 1136  TempSrc:   PainSc: 0-No pain                 Effie Berkshire

## 2017-12-10 NOTE — Interval H&P Note (Signed)
History and Physical Interval Note:  12/10/2017 8:04 AM  Robert [name].  has presented today for surgery, with the diagnosis of END STAGE RENAL DISEASE FOR HEMODIALYSIS ACCESS  The various methods of treatment have been discussed with the patient and family. After consideration of risks, benefits and other options for treatment, the patient has consented to  Procedure(s): SECOND STAGE BASILIC VEIN TRANSPOSITION LEFT ARM (Left) as a surgical intervention .  The patient's history has been reviewed, patient examined, no change in status, stable for surgery.  I have reviewed the patient's chart and labs.  Questions were answered to the patient's satisfaction.     [name]

## 2017-12-10 NOTE — Anesthesia Procedure Notes (Signed)
Procedure Name: MAC Date/Time: 12/10/2017 8:41 AM Performed by: Scheryl Darter, CRNA Pre-anesthesia Checklist: Patient identified, Emergency Drugs available, Suction available and Patient being monitored Patient Re-evaluated:Patient Re-evaluated prior to induction Oxygen Delivery Method: Simple face mask Preoxygenation: Pre-oxygenation with 100% oxygen Induction Type: IV induction

## 2017-12-11 ENCOUNTER — Encounter (HOSPITAL_COMMUNITY): Payer: Self-pay | Admitting: Surgery

## 2017-12-22 ENCOUNTER — Ambulatory Visit (INDEPENDENT_AMBULATORY_CARE_PROVIDER_SITE_OTHER): Payer: Medicare Other | Admitting: Physician Assistant

## 2017-12-22 ENCOUNTER — Other Ambulatory Visit: Payer: Self-pay

## 2017-12-22 DIAGNOSIS — Z992 Dependence on renal dialysis: Secondary | ICD-10-CM

## 2017-12-22 DIAGNOSIS — N186 End stage renal disease: Secondary | ICD-10-CM | POA: Insufficient documentation

## 2017-12-22 NOTE — Progress Notes (Signed)
    Postoperative Access Visit   History of Present Illness   Robert Wyss. is a 82 y.o. year old male who presents for postoperative follow-up for: left second stage basilic vein transposition by Dr. Trula Slade (Date: 12/10/17).  The patient's wounds are healing well with some dependent edema in L arm.  The patient denies steal symptoms.  The patient is able to complete their activities of daily living.  He is dialyzing via r IJ TDC under the care of Dr. Florene Glen.  HD on TTS schedule.   Physical Examination   Vitals:   12/22/17 1437  BP: (!) 132/56  Pulse: 66  Resp: 18  Temp: (!) 97.2 F (36.2 C)  TempSrc: Oral  SpO2: 98%  Weight: 164 lb (74.4 kg)  Height: 5\' 8"  (1.727 m)    Body mass index is 24.94 kg/m.  left arm Incisions are healing well, hand grip is 5/5, sensation in digits is intact, palpable thrill, bruit can be auscultated; some edema around mid arm and elbow however thrill is easily palpable; palpable L radial pulse     Medical Decision Making   Robert [name]. is a 82 y.o. year old male who presents s/p left second stage basilic vein transposition   I will allow another 3-4 weeks for edema to resolve prior to using L arm AV fistula (01/15/18 ok to use) .  The patient's tunneled dialysis catheter can be removed when Nephrology is comfortable with the performance of the fistula  He may follow up on an as needed basis   Dagoberto Ligas PA-C [facility] [place]: [phone]

## 2017-12-25 NOTE — Progress Notes (Signed)
Cardiology Office Note:    Date:  12/26/2017   ID:  Robert Sarks., DOB 24-Sep-1935, MRN [id number]  PCP:  Burnard Bunting, MD  Cardiologist:  Shirlee More, MD    Referring MD: Burnard Bunting, MD    ASSESSMENT:    1. Hyperlipidemia, unspecified hyperlipidemia type   2. Coronary artery disease of native artery of native heart with stable angina pectoris ([place])   3. ESRD on dialysis ([place])   4. Hypertensive heart disease with heart failure ([place])   5. Chronic diastolic CHF (congestive heart failure) (HCC)    PLAN:    In order of problems listed above:  1. Stable continue his statin with CAD and CKD 2. Stable CAD presently having no anginal discomfort he will follow-up in 6 months a decision can be made about appropriateness of an ischemia evaluation at that time.  Continue with medical therapy with aspirin reduced to 81 mg daily clopidogrel beta-blocker and statin 3. Stable managed by nephrology 4. Stable managed by nephrology 5. Stable volume overload is managed for ultrafiltration and hemodialysis   Next appointment: 6 months Dr. Wynonia Lawman   Medication Adjustments/Labs and Tests Ordered: Current medicines are reviewed at length with the patient today.  Concerns regarding medicines are outlined above.  No orders of the defined types were placed in this encounter.  No orders of the defined types were placed in this encounter.   Chief Complaint  Patient presents with  . Coronary Artery Disease  . Chronic Kidney Disease    History of Present Illness:    Robert Overfelt. is a 82 y.o. male with a hx of CAD and stage 5 CKD with RRT.last seen by Dr Wynonia Lawman. Compliance with diet, lifestyle and medications: Yes  Overall pleased with the quality of his life except he finds himself exhausted in the day of dialysis.  He has very mild shortness of breath when he walks a longer distance perchance grandchildren and great-grandchildren but not during daily activities no edema  orthopnea no angina palpitation or will be starting to use the AV fistula in his left upper extremity in the near future.  He has labs done frequently on dialysis and I do not have access to.  His last lipid profile is at target with an LDL less than 50 Past Medical History:  Diagnosis Date  . Allergy    year round  . Anemia   . Cancer (HCC)    PSA ELEVATED  . Carotid artery occlusion   . Cataract   . Depression   . Diabetes mellitus   . Dyspnea   . ESRD (end stage renal disease) [facility]    [place]  . GERD (gastroesophageal reflux disease)   . Hearing deficit   . History of kidney stones   . Hyperlipidemia   . Hypertension   . Hypothyroidism   . Myocardial infarction ([place]) 2011  . Stroke (cerebrum) (HCC)    weakness in left hand  04/2015, 05/20/16  . Thyroid disease    hypothyroid    Past Surgical History:  Procedure Laterality Date  . A/V FISTULAGRAM Right 06/24/2017   Procedure: A/V FISTULAGRAM;  Surgeon: Serafina Mitchell, MD;  Location: [place] [facility];  Service: Cardiovascular;  Laterality: Right;  rt  lower arm  . AV FISTULA PLACEMENT Right 07/25/2016   Procedure: ARTERIOVENOUS (AV) FISTULA CREATION-RIGHT;  Surgeon: Serafina Mitchell, MD;  Location: [place];  Service: Vascular;  Laterality: Right;  . BASCILIC VEIN TRANSPOSITION Left 10/15/2017  Procedure: FIRST STAGE BASILIC VEIN TRANSPOSITION LEFT UPPER EXTREMITY;  Surgeon: Serafina Mitchell, MD;  Location: [place];  Service: Vascular;  Laterality: Left;  . BASCILIC VEIN TRANSPOSITION Left 12/10/2017   Procedure: SECOND STAGE BASILIC VEIN TRANSPOSITION LEFT ARM;  Surgeon: Serafina Mitchell, MD;  Location: [facility];  Service: Vascular;  Laterality: Left;  . COLONOSCOPY    . CORONARY ANGIOPLASTY WITH STENT PLACEMENT  07/2009   3 stents  . ELBOW SURGERY     LEFT  . ENDARTERECTOMY Right 06/06/2016   Procedure: RIGHT CAROTID ENDARTERECTOMY WITH PATCH ANGIOPLASTY;  Surgeon: Serafina Mitchell, MD;  Location: [place];  Service:  Vascular;  Laterality: Right;  . EP IMPLANTABLE DEVICE N/A 05/16/2015   Procedure: Loop Recorder Insertion;  Surgeon: Thompson Grayer, MD;  Location: [place] [facility];  Service: Cardiovascular;  Laterality: N/A;  . ESOPHAGOSCOPY WITH DILITATION    . FISTULA SUPERFICIALIZATION Right 12/05/2016   Procedure: Superficialization and Ligation of Branches OF RIGHT ARM RADIOCEPHALIC FISTULA;  Surgeon: Serafina Mitchell, MD;  Location: [place];  Service: Vascular;  Laterality: Right;  . NOSE SURGERY    . PERIPHERAL VASCULAR BALLOON ANGIOPLASTY  06/24/2017   Procedure: PERIPHERAL VASCULAR BALLOON ANGIOPLASTY;  Surgeon: Serafina Mitchell, MD;  Location: [place] [facility];  Service: Cardiovascular;;  rt arm fistula   . stents    . TEE WITHOUT CARDIOVERSION N/A 05/16/2015   Procedure: TRANSESOPHAGEAL ECHOCARDIOGRAM (TEE);  Surgeon: Thayer Headings, MD;  Location: [facility];  Service: Cardiovascular;  Laterality: N/A;    Current Medications: Current Meds  Medication Sig  . ACCU-CHEK AVIVA PLUS test strip   . aspirin 325 MG EC tablet Take 325 mg by mouth daily.  . B Complex-C-Folic Acid (DIALYVITE 956) 0.8 MG TABS Take 1 tablet by mouth every evening.   . calcium acetate (PHOSLO) 667 MG tablet Take 1,334 mg by mouth See admin instructions. Take 2 tablets (1334 mg) by mouth daily with any meals or snacks.  . clopidogrel (PLAVIX) 75 MG tablet Take 1 tablet (75 mg total) by mouth daily.  [name] ezetimibe (ZETIA) 10 MG tablet Take 1 tablet (10 mg total) by mouth daily. (Patient taking differently: Take 10 mg by mouth every evening. )  . fexofenadine (ALLEGRA) 180 MG tablet Take 180 mg by mouth daily.  . hydrALAZINE (APRESOLINE) 100 MG tablet Take 100 mg by mouth See admin instructions. Sunday, Monday, Wednesday & Friday patient takes 1 tablet (100 mg) by mouth twice daily. Tuesday, Thursday, & Saturdays patient takes 1 tablet (100 mg) by mouth in the evening only.  . insulin degludec (TRESIBA FLEXTOUCH) 100 UNIT/ML  SOPN FlexTouch Pen Inject 15 Units into the skin daily.   [name] levothyroxine (SYNTHROID, LEVOTHROID) 50 MCG tablet Take 50 mcg by mouth daily before breakfast.   . metoprolol succinate (TOPROL-XL) 50 MG 24 hr tablet Take 50 mg by mouth 2 (two) times daily. Sunday, Monday, Wednesday & Friday patient takes 1 tablet (50 mg) by mouth twice daily. Tuesday, Thursday, & Saturdays patient takes 1 tablet (50 mg) by mouth in the evening only.  . Multiple Vitamins-Minerals (PRESERVISION AREDS 2 PO) Take 1 capsule by mouth every evening.  . pantoprazole (PROTONIX) 40 MG tablet Take 40 mg by mouth every evening.   . sertraline (ZOLOFT) 25 MG tablet Take 25 mg by mouth daily.  . simvastatin (ZOCOR) 40 MG tablet Take 1 tablet (40 mg total) by mouth daily at 6 PM. (Patient taking differently: Take 40 mg by mouth  every evening. )  . sodium bicarbonate 650 MG tablet Take 650 mg by mouth 2 (two) times daily.     Allergies:   Lisinopril   Social History   Socioeconomic History  . Marital status: Married    Spouse name: Not on file  . Number of children: Not on file  . Years of education: Not on file  . Highest education level: Not on file  Occupational History  . Not on file  Social Needs  . Financial resource strain: Not on file  . Food insecurity:    Worry: Not on file    Inability: Not on file  . Transportation needs:    Medical: Not on file    Non-medical: Not on file  Tobacco Use  . Smoking status: Former Research scientist (life sciences)  . Smokeless tobacco: Never Used  . Tobacco comment: quit age 89  Substance and Sexual Activity  . Alcohol use: Yes    Alcohol/week: 2.0 standard drinks    Types: 2 Glasses of wine per week    Comment: rarely  . Drug use: No  . Sexual activity: Not on file  Lifestyle  . Physical activity:    Days per week: Not on file    Minutes per session: Not on file  . Stress: Not on file  Relationships  . Social connections:    Talks on phone: Not on file    Gets together: Not on file     Attends religious service: Not on file    Active member of club or organization: Not on file    Attends meetings of clubs or organizations: Not on file    Relationship status: Not on file  Other Topics Concern  . Not on file  Social History Narrative  . Not on file     Family History: The patient's family history includes Dementia in his maternal grandfather; Diabetes in his mother. There is no history of Heart attack. ROS:   Please see the history of present illness.    All other systems reviewed and are negative.  EKGs/Labs/Other Studies Reviewed:    The following studies were reviewed today:  EKG:  EKG ordered today.  The ekg ordered today demonstrates [place] 1 PVC specific ST and T changes most consistent with repolarization  Recent Labs: 06/24/2017: BUN 80; Creatinine, Ser 5.90 12/10/2017: Hemoglobin 9.2; Potassium 3.8; Sodium 141  Recent Lipid Panel    Component Value Date/Time   CHOL 135 05/24/2016 1022   TRIG 367 (H) 05/24/2016 1022   HDL 26 (L) 05/24/2016 1022   CHOLHDL 5.2 05/24/2016 1022   VLDL 73 (H) 05/24/2016 1022   LDLCALC 36 05/24/2016 1022    Physical Exam:    VS:  BP (!) 102/52 (BP Location: Right Arm, Patient Position: Sitting, Cuff Size: Normal)   Pulse 74   Ht 5\' 8"  (1.727 m)   Wt 163 lb 1.9 oz (74 kg)   SpO2 95%   BMI 24.80 kg/m     Wt Readings from Last 3 Encounters:  12/26/17 163 lb 1.9 oz (74 kg)  12/22/17 164 lb (74.4 kg)  12/10/17 165 lb (74.8 kg)     GEN:  Well nourished, well developed in no acute distress HEENT: Normal NECK: No JVD; No carotid bruits LYMPHATICS: No lymphadenopathy CARDIAC: RRR, no murmurs, rubs, gallops RESPIRATORY:  Clear to auscultation without rales, wheezing or rhonchi  ABDOMEN: Soft, non-tender, non-distended MUSCULOSKELETAL:  No edema; No deformity  SKIN: Warm and dry NEUROLOGIC:  Alert and oriented x  3 PSYCHIATRIC:  Normal affect    Signed, Shirlee More, MD  12/26/2017 9:09 AM    [place]

## 2017-12-26 ENCOUNTER — Ambulatory Visit (INDEPENDENT_AMBULATORY_CARE_PROVIDER_SITE_OTHER): Payer: Medicare Other | Admitting: *Deleted

## 2017-12-26 ENCOUNTER — Encounter: Payer: Self-pay | Admitting: Cardiology

## 2017-12-26 ENCOUNTER — Ambulatory Visit: Payer: Medicare Other | Admitting: Cardiology

## 2017-12-26 VITALS — BP 102/52 | HR 74 | Ht 68.0 in | Wt 163.1 lb

## 2017-12-26 DIAGNOSIS — I639 Cerebral infarction, unspecified: Secondary | ICD-10-CM | POA: Diagnosis not present

## 2017-12-26 DIAGNOSIS — Z992 Dependence on renal dialysis: Secondary | ICD-10-CM

## 2017-12-26 DIAGNOSIS — E785 Hyperlipidemia, unspecified: Secondary | ICD-10-CM | POA: Diagnosis not present

## 2017-12-26 DIAGNOSIS — N186 End stage renal disease: Secondary | ICD-10-CM | POA: Diagnosis not present

## 2017-12-26 DIAGNOSIS — I5032 Chronic diastolic (congestive) heart failure: Secondary | ICD-10-CM

## 2017-12-26 DIAGNOSIS — I25118 Atherosclerotic heart disease of native coronary artery with other forms of angina pectoris: Secondary | ICD-10-CM | POA: Diagnosis not present

## 2017-12-26 DIAGNOSIS — I110 Hypertensive heart disease with heart failure: Secondary | ICD-10-CM

## 2017-12-26 DIAGNOSIS — I2510 Atherosclerotic heart disease of native coronary artery without angina pectoris: Secondary | ICD-10-CM | POA: Insufficient documentation

## 2017-12-26 MED ORDER — ASPIRIN EC 81 MG PO TBEC: 81.0000 mg | DELAYED_RELEASE_TABLET | Freq: Every day | ORAL | 0 refills | Status: AC

## 2017-12-26 NOTE — Patient Instructions (Signed)
Medication Instructions:  Your physician has recommended you make the following change in your medication:   DECREASE: Aspirin 325mg   Daily to 81 mg daily  If you need a refill on your cardiac medications before your next appointment, please call your pharmacy.   Lab work: NONE If you have labs (blood work) drawn today and your tests are completely normal, you will receive your results only by: [name] MyChart Message (if you have MyChart) OR . A paper copy in the mail If you have any lab test that is abnormal or we need to change your treatment, we will call you to review the results.  Testing/Procedures: You had an EKG today  Follow-Up: At [facility], you and your health needs are our priority.  As part of our continuing mission to provide you with exceptional heart care, we have created designated Provider Care Teams.  These Care Teams include your primary Cardiologist (physician) and Advanced Practice Providers (APPs -  Physician Assistants and Nurse Practitioners) who all work together to provide you with the care you need, when you need it. You will need a follow up appointment in 6 months.  Please call our office 2 months in advance to schedule this appointment.    Any Other Special Instructions Will Be Listed Below (If Applicable).

## 2017-12-27 NOTE — Progress Notes (Signed)
Carelink Summary Report / Loop Recorder 

## 2018-01-16 LAB — CUP PACEART REMOTE DEVICE CHECK
Date Time Interrogation Session: 20191101084118
MDC IDC PG IMPLANT DT: 20170321

## 2018-01-28 ENCOUNTER — Ambulatory Visit (INDEPENDENT_AMBULATORY_CARE_PROVIDER_SITE_OTHER): Payer: Medicare Other

## 2018-01-28 DIAGNOSIS — I639 Cerebral infarction, unspecified: Secondary | ICD-10-CM | POA: Diagnosis not present

## 2018-01-28 NOTE — Progress Notes (Signed)
Carelink Summary Report / Loop Recorder 

## 2018-03-02 ENCOUNTER — Ambulatory Visit (INDEPENDENT_AMBULATORY_CARE_PROVIDER_SITE_OTHER): Payer: Medicare Other

## 2018-03-02 DIAGNOSIS — I639 Cerebral infarction, unspecified: Secondary | ICD-10-CM | POA: Diagnosis not present

## 2018-03-02 LAB — CUP PACEART REMOTE DEVICE CHECK
MDC IDC PG IMPLANT DT: 20170321
MDC IDC SESS DTM: 20200106113606

## 2018-03-03 NOTE — Progress Notes (Signed)
Carelink Summary Report / Loop Recorder 

## 2018-03-04 LAB — CUP PACEART REMOTE DEVICE CHECK
Implantable Pulse Generator Implant Date: 20170321
MDC IDC SESS DTM: 20191204111122

## 2018-04-06 ENCOUNTER — Ambulatory Visit (INDEPENDENT_AMBULATORY_CARE_PROVIDER_SITE_OTHER): Payer: Medicare Other

## 2018-04-06 DIAGNOSIS — I639 Cerebral infarction, unspecified: Secondary | ICD-10-CM

## 2018-04-07 LAB — CUP PACEART REMOTE DEVICE CHECK
Implantable Pulse Generator Implant Date: 20170321
MDC IDC SESS DTM: 20200208113543

## 2018-04-17 NOTE — Progress Notes (Signed)
Carelink Summary Report / Loop Recorder 

## 2018-05-11 ENCOUNTER — Ambulatory Visit (INDEPENDENT_AMBULATORY_CARE_PROVIDER_SITE_OTHER): Payer: Medicare Other | Admitting: *Deleted

## 2018-05-11 ENCOUNTER — Other Ambulatory Visit: Payer: Self-pay

## 2018-05-11 DIAGNOSIS — I639 Cerebral infarction, unspecified: Secondary | ICD-10-CM | POA: Diagnosis not present

## 2018-05-13 LAB — CUP PACEART REMOTE DEVICE CHECK
Implantable Pulse Generator Implant Date: 20170321
MDC IDC SESS DTM: 20200312144009

## 2018-05-18 ENCOUNTER — Ambulatory Visit (HOSPITAL_COMMUNITY): Payer: Medicare Other

## 2018-05-18 ENCOUNTER — Ambulatory Visit: Payer: Medicare Other | Admitting: Surgery

## 2018-05-19 NOTE — Progress Notes (Signed)
Carelink Summary Report / Loop Recorder 

## 2018-06-01 ENCOUNTER — Encounter (INDEPENDENT_AMBULATORY_CARE_PROVIDER_SITE_OTHER): Payer: Self-pay

## 2018-06-03 ENCOUNTER — Telehealth: Payer: Self-pay | Admitting: Cardiology

## 2018-06-03 ENCOUNTER — Telehealth: Payer: Self-pay

## 2018-06-03 NOTE — Telephone Encounter (Signed)
°*  STAT* If patient is at the pharmacy, call can be transferred to refill team.   1. Which medications need to be refilled? (please list name of each medication and dose if known) Metoprolol SUCC ER 50MG   2. Which pharmacy/location (including street and city if local pharmacy) is medication to be sent to? [facility]  3. Do they need a 30 day or 90 day supply? 90 day

## 2018-06-03 NOTE — Telephone Encounter (Signed)
I spoke with the pt wife and requested that the patient not send manual remote transmission with his monitor but she did not understand what I was trying to say. I gave her my direct office number to have the pt to give me a call back.

## 2018-06-04 MED ORDER — METOPROLOL SUCCINATE ER 50 MG PO TB24: ORAL_TABLET | ORAL | 0 refills | Status: DC

## 2018-06-04 MED ORDER — METOPROLOL SUCCINATE ER 50 MG PO TB24: 50.0000 mg | ORAL_TABLET | Freq: Two times a day (BID) | ORAL | 1 refills | Status: DC

## 2018-06-04 NOTE — Addendum Note (Signed)
Addended by: Austin Miles on: 06/04/2018 11:04 AM   Modules accepted: Orders

## 2018-06-04 NOTE — Telephone Encounter (Signed)
Metoprolol refill sent to [facility] pharmacy per pt preference

## 2018-06-05 NOTE — Telephone Encounter (Signed)
Pt called back and I re educated him about how to use his home monitor. Pt verbalized understanding.

## 2018-06-09 ENCOUNTER — Other Ambulatory Visit: Payer: Self-pay

## 2018-06-09 ENCOUNTER — Ambulatory Visit (INDEPENDENT_AMBULATORY_CARE_PROVIDER_SITE_OTHER): Payer: Medicare Other | Admitting: *Deleted

## 2018-06-09 DIAGNOSIS — I639 Cerebral infarction, unspecified: Secondary | ICD-10-CM

## 2018-06-09 LAB — CUP PACEART REMOTE DEVICE CHECK
Date Time Interrogation Session: 20200414174011
Implantable Pulse Generator Implant Date: 20170321

## 2018-06-16 NOTE — Progress Notes (Signed)
Carelink Summary Report / Loop Recorder 

## 2018-06-22 ENCOUNTER — Telehealth: Payer: Self-pay | Admitting: *Deleted

## 2018-06-22 NOTE — Telephone Encounter (Signed)
YOUR CARDIOLOGY TEAM HAS ARRANGED FOR AN E-VISIT FOR YOUR APPOINTMENT - PLEASE REVIEW IMPORTANT INFORMATION BELOW SEVERAL DAYS PRIOR TO YOUR APPOINTMENT  Due to the recent COVID-19 pandemic, we are transitioning in-person office visits to tele-medicine visits in an effort to decrease unnecessary exposure to our patients, their families, and staff. These visits are billed to your insurance just like a normal visit is. We also encourage you to sign up for MyChart if you have not already done so. You will need a smartphone if possible. For patients that do not have this, we can still complete the visit using a regular telephone but do prefer a smartphone to enable video when possible. You may have a family member that lives with you that can help. If possible, we also ask that you have a blood pressure cuff and scale at home to measure your blood pressure, heart rate and weight prior to your scheduled appointment. Patients with clinical needs that need an in-person evaluation and testing will still be able to come to the office if absolutely necessary. If you have any questions, feel free to call our office.     YOUR PROVIDER WILL BE USING THE FOLLOWING PLATFORM TO COMPLETE YOUR VISIT: [email or web address]  . IF USING MYCHART - How to Download the MyChart App to Your SmartPhone   - If Apple, go to [employer] and type in MyChart in the search bar and download the app. If Android, ask patient to go to [employer] and type in [place] in the search bar and download the app. The app is free but as with any other app downloads, your phone may require you to verify saved payment information or Apple/Android password.  - You will need to then log into the app with your MyChart username and password, and select [facility] as your healthcare provider to link the account.  - When it is time for your visit, go to the MyChart app, find appointments, and click Begin Video Visit. Be sure to Select Allow for your device to  access the Microphone and Camera for your visit. You will then be connected, and your provider will be with you shortly.  **If you have any issues connecting or need assistance, please contact MyChart service desk [phone])**  **If using a computer, in order to ensure the best quality for your visit, you will need to use either of the following Internet Browsers: Insurance underwriter or [employer]**  . IF USING DOXIMITY or [email or web address].ME - The staff will give you instructions on receiving your link to join the meeting the day of your visit.      2-3 DAYS BEFORE YOUR APPOINTMENT  You will receive a telephone call from one of our [facility] team members - your caller ID may say "Unknown caller." If this is a video visit, we will walk you through how to get the video launched on your phone. We will remind you check your blood pressure, heart rate and weight prior to your scheduled appointment. If you have an Apple Watch or Kardia, please upload any pertinent ECG strips the day before or morning of your appointment to [facility]. Our staff will also make sure you have reviewed the consent and agree to move forward with your scheduled tele-health visit.     THE DAY OF YOUR APPOINTMENT  Approximately 15 minutes prior to your scheduled appointment, you will receive a telephone call from one of [facility] team - your caller ID may say "Unknown caller."  Our staff will confirm medications, vital signs for the day and any symptoms you may be experiencing. Please have this information available prior to the time of visit start. It may also be helpful for you to have a pad of paper and pen handy for any instructions given during your visit. They will also walk you through joining the smartphone meeting if this is a video visit.    CONSENT FOR TELE-HEALTH VISIT - PLEASE REVIEW  I hereby voluntarily request, consent and authorize [facility] and its employed or contracted physicians, physician  assistants, nurse practitioners or other licensed health care professionals (the Practitioner), to provide me with telemedicine health care services (the "Services") as deemed necessary by the treating Practitioner. I acknowledge and consent to receive the Services by the Practitioner via telemedicine. I understand that the telemedicine visit will involve communicating with the Practitioner through live audiovisual communication technology and the disclosure of certain medical information by electronic transmission. I acknowledge that I have been given the opportunity to request an in-person assessment or other available alternative prior to the telemedicine visit and am voluntarily participating in the telemedicine visit.  I understand that I have the right to withhold or withdraw my consent to the use of telemedicine in the course of my care at any time, without affecting my right to future care or treatment, and that the Practitioner or I may terminate the telemedicine visit at any time. I understand that I have the right to inspect all information obtained and/or recorded in the course of the telemedicine visit and may receive copies of available information for a reasonable fee.  I understand that some of the potential risks of receiving the Services via telemedicine include:  [name] Delay or interruption in medical evaluation due to technological equipment failure or disruption; . Information transmitted may not be sufficient (e.g. poor resolution of images) to allow for appropriate medical decision making by the Practitioner; and/or  . In rare instances, security protocols could fail, causing a breach of personal health information.  Furthermore, I acknowledge that it is my responsibility to provide information about my medical history, conditions and care that is complete and accurate to the best of my ability. I acknowledge that Practitioner's advice, recommendations, and/or decision may be based on  factors not within their control, such as incomplete or inaccurate data provided by me or distortions of diagnostic images or specimens that may result from electronic transmissions. I understand that the practice of medicine is not an exact science and that Practitioner makes no warranties or guarantees regarding treatment outcomes. I acknowledge that I will receive a copy of this consent concurrently upon execution via email to the email address I last provided but may also request a printed copy by calling the office of [facility].    I understand that my insurance will be billed for this visit.   I have read or had this consent read to me. . I understand the contents of this consent, which adequately explains the benefits and risks of the Services being provided via telemedicine.  . I have been provided ample opportunity to ask questions regarding this consent and the Services and have had my questions answered to my satisfaction. . I give my informed consent for the services to be provided through the use of telemedicine in my medical care  By participating in this telemedicine visit I agree to the above.  Patient verbally consented to virtual visit on Wednesday, 06/24/2018, at 10:00 am.

## 2018-06-23 NOTE — Progress Notes (Signed)
Virtual Visit via Video Note   This visit type was conducted due to national recommendations for restrictions regarding the COVID-19 Pandemic (e.g. social distancing) in an effort to limit this patient's exposure and mitigate transmission in our community.  Due to his co-morbid illnesses, this patient is at least at moderate risk for complications without adequate follow up.  This format is felt to be most appropriate for this patient at this time.  All issues noted in this document were discussed and addressed.  A limited physical exam was performed with this format.  Please refer to the patient's chart for his consent to telehealth for [facility].   Evaluation Performed:  Follow-up visit  Date:  06/24/2018   ID:  Robert Sarks., DOB [birth year], MRN [id number]  Patient Location: Home   PCP:  Burnard Bunting, MD  Cardiologist:  No primary care provider on file. Dr Bettina Gavia at home Electrophysiologist:  None   Chief Complaint: For CAD  History of Present Illness:    Robert Marchena. is a 83 y.o. male with a hx of CAD EF 55-60% with  PCI and stent 2011 LCEA hypertension hyperlipidemia T2 DM and stage 5 CKD with RRT.last seen 12/26/17. He has a LINQ followed in device clinic.  Fortunately his grandson is present utilizing his iPhone.  Robert [name] is pleased with the quality of his life he does have cramps at the end of his dialysis sessions.  His weight continues to fall and is now down 3 to 4 pounds from the last visit and he is trying to increase calories and protein in his diet.  He has had no edema shortness of breath chest pain palpitation or syncope and no TIA with his carotid stenosis.  I reviewed his loop recorder download with no arrhythmia noted.  06/09/18:   Indications  Cerebrovascular accident (CVA), unspecified mechanism ([place]) [I63.9 (ICD-10-CM)]  Conclusion  Carelink summary report received. Battery status OK. Normal device function. No new symptom episodes, tachy  episodes, brady, or pause episodes. No new AF episodes.   Monthly summary reports and ROV/PRN- JBox, RN/CVRS  Result Report  Clinic Name: [place]      Date Time Interrogation Session: [id number]       Implantable Pulse Generator Implant Date: [id number]      Implantable Pulse Generator Type: ICM/ILR       Pulse Gen Model: [id number] [id number]      Pulse Gen Serial Number: [id number]       Pulse Generator Manufacturer: MERM         He is followed by vascular surgery, last carotid duplex 11/17/17:    Final Interpretation: Right Carotid: Velocities in the right ICA are consistent with a 60-79%                stenosis. The ECA appears >50% stenosed. Left Carotid: Velocities in the left ICA are consistent with a 1-39% stenosis. Vertebrals:  Bilateral vertebral arteries demonstrate antegrade flow. Subclavians: Normal flow hemodynamics were seen in bilateral subclavian              arteries. Monophasic left subclavian artery secondary to dialysis              access.  The patient does not have symptoms concerning for COVID-19 infection (fever, chills, cough, or new shortness of breath).    Past Medical History:  Diagnosis Date  . Allergy    year round  . Anemia   . Cancer ([place])  PSA ELEVATED  . Carotid artery occlusion   . Cataract   . Depression   . Diabetes mellitus   . Dyspnea   . ESRD (end stage renal disease) [facility])    [place]  . GERD (gastroesophageal reflux disease)   . Hearing deficit   . History of kidney stones   . Hyperlipidemia   . Hypertension   . Hypothyroidism   . Myocardial infarction ([place]) 2011  . Stroke (cerebrum) (HCC)    weakness in left hand  04/2015, 05/20/16  . Thyroid disease    hypothyroid   Past Surgical History:  Procedure Laterality Date  . A/V FISTULAGRAM Right 06/24/2017   Procedure: A/V FISTULAGRAM;  Surgeon: Serafina Mitchell, MD;  Location: [place] CV [facility];  Service: Cardiovascular;  Laterality: Right;   rt  lower arm  . AV FISTULA PLACEMENT Right 07/25/2016   Procedure: ARTERIOVENOUS (AV) FISTULA CREATION-RIGHT;  Surgeon: Serafina Mitchell, MD;  Location: [place];  Service: Vascular;  Laterality: Right;  . BASCILIC VEIN TRANSPOSITION Left 10/15/2017   Procedure: FIRST STAGE BASILIC VEIN TRANSPOSITION LEFT UPPER EXTREMITY;  Surgeon: Serafina Mitchell, MD;  Location: [place];  Service: Vascular;  Laterality: Left;  . BASCILIC VEIN TRANSPOSITION Left 12/10/2017   Procedure: SECOND STAGE BASILIC VEIN TRANSPOSITION LEFT ARM;  Surgeon: Serafina Mitchell, MD;  Location: [facility];  Service: Vascular;  Laterality: Left;  . COLONOSCOPY    . CORONARY ANGIOPLASTY WITH STENT PLACEMENT  07/2009   3 stents  . ELBOW SURGERY     LEFT  . ENDARTERECTOMY Right 06/06/2016   Procedure: RIGHT CAROTID ENDARTERECTOMY WITH PATCH ANGIOPLASTY;  Surgeon: Serafina Mitchell, MD;  Location: [place];  Service: Vascular;  Laterality: Right;  . EP IMPLANTABLE DEVICE N/A 05/16/2015   Procedure: Loop Recorder Insertion;  Surgeon: Thompson Grayer, MD;  Location: [address] CV LAB;  Service: Cardiovascular;  Laterality: N/A;  . ESOPHAGOSCOPY WITH DILITATION    . FISTULA SUPERFICIALIZATION Right 12/05/2016   Procedure: Superficialization and Ligation of Branches OF RIGHT ARM RADIOCEPHALIC FISTULA;  Surgeon: Serafina Mitchell, MD;  Location: [place];  Service: Vascular;  Laterality: Right;  . NOSE SURGERY    . PERIPHERAL VASCULAR BALLOON ANGIOPLASTY  06/24/2017   Procedure: PERIPHERAL VASCULAR BALLOON ANGIOPLASTY;  Surgeon: Serafina Mitchell, MD;  Location: [place] CV [facility];  Service: Cardiovascular;;  rt arm fistula   . stents    . TEE WITHOUT CARDIOVERSION N/A 05/16/2015   Procedure: TRANSESOPHAGEAL ECHOCARDIOGRAM (TEE);  Surgeon: Thayer Headings, MD;  Location: [place];  Service: Cardiovascular;  Laterality: N/A;     Current Meds  Medication Sig  . ACCU-CHEK AVIVA PLUS test strip   . aspirin 81 MG tablet Take 1 tablet (81 mg total) by mouth  daily.  . B Complex-C-Folic Acid (DIALYVITE 259) 0.8 MG TABS Take 1 tablet by mouth every evening.   . calcium acetate (PHOSLO) 667 MG tablet Take 1,334 mg by mouth See admin instructions. Take 2 tablets (1334 mg) by mouth daily with any meals or snacks.  . clopidogrel (PLAVIX) 75 MG tablet Take 1 tablet (75 mg total) by mouth daily.  [name] ezetimibe (ZETIA) 10 MG tablet Take 1 tablet (10 mg total) by mouth daily.  . fexofenadine (ALLEGRA) 180 MG tablet Take 180 mg by mouth daily.  . hydrALAZINE (APRESOLINE) 100 MG tablet Take 100 mg by mouth See admin instructions. Sunday, Monday, Wednesday & Friday patient takes 1 tablet (100 mg) by mouth twice daily. Tuesday,  Thursday, & Saturdays take at bedtime  . insulin degludec (TRESIBA FLEXTOUCH) 100 UNIT/ML SOPN FlexTouch Pen Inject 15 Units into the skin daily.   [name] levothyroxine (SYNTHROID, LEVOTHROID) 50 MCG tablet Take 50 mcg by mouth daily before breakfast.   . metoprolol succinate (TOPROL-XL) 50 MG 24 hr tablet Sun, Mon, Wed, Fri, take 1 tablet (50 mg) by mouth twice daily. Tues, Thurs, & Sat, take 1 tablet (50 mg) by mouth in the evening only.  . Multiple Vitamins-Minerals (PRESERVISION AREDS 2 PO) Take 1 capsule by mouth every evening.  . pantoprazole (PROTONIX) 40 MG tablet Take 40 mg by mouth every evening.   . sertraline (ZOLOFT) 25 MG tablet Take 25 mg by mouth daily.  . simvastatin (ZOCOR) 40 MG tablet Take 1 tablet (40 mg total) by mouth daily at 6 PM.  . sodium bicarbonate 650 MG tablet Take 650 mg by mouth 2 (two) times daily.     Allergies:   Lisinopril   Social History   Tobacco Use  . Smoking status: Former Research scientist (life sciences)  . Smokeless tobacco: Never Used  . Tobacco comment: quit age 46  Substance Use Topics  . Alcohol use: Yes    Alcohol/week: 2.0 standard drinks    Types: 2 Glasses of wine per week    Comment: rarely  . Drug use: No     Family Hx: The patient's family history includes Dementia in his maternal grandfather; Diabetes  in his mother. There is no history of Heart attack.  ROS:   Please see the history of present illness.     All other systems reviewed and are negative.   Prior CV studies:   The following studies were reviewed today:    Labs/Other Tests and Data Reviewed:   I received an email from the dialysis center but I cannot read his labs and I asked my office to re-request and I will make an addendum to this note when I receive his lab work  Recent Labs: 12/10/2017: Hemoglobin 9.2; Potassium 3.8; Sodium 141   Recent Lipid Panel Lab Results  Component Value Date/Time   CHOL 135 05/24/2016 10:22 AM   TRIG 367 (H) 05/24/2016 10:22 AM   HDL 26 (L) 05/24/2016 10:22 AM   CHOLHDL 5.2 05/24/2016 10:22 AM   LDLCALC 36 05/24/2016 10:22 AM    Wt Readings from Last 3 Encounters:  06/24/18 160 lb 4.4 oz (72.7 kg)  12/26/17 163 lb 1.9 oz (74 kg)  12/22/17 164 lb (74.4 kg)     Objective:    Vital Signs:  BP 125/62   Pulse (!) 52   Ht 5\' 8"  (1.727 m)   Wt 160 lb 4.4 oz (72.7 kg)   BMI 24.37 kg/m    VITAL SIGNS:  reviewed GEN:  no acute distress EYES:  sclerae anicteric, EOMI - Extraocular Movements Intact RESPIRATORY:  normal respiratory effort, symmetric expansion CARDIOVASCULAR:  no peripheral edema SKIN:  no rash, lesions or ulcers. MUSCULOSKELETAL:  no obvious deformities. NEURO:  alert and oriented x 3, no obvious focal deficit PSYCH:  normal affect  ASSESSMENT & PLAN:    1. Coronary artery disease stable asymptomatic New York Heart Association class I continue his current medical therapy and at this time I would not advise an ischemia evaluation. 2. Hypertensive heart disease with heart failure stable asymptomatic he has no volume overload with ultrafiltration from renal replacement therapy and continue therapy beta-blocker and hydralazine 3. Hyperlipidemia stable continue combined therapy statin plus Zetia and await labs from  the dialysis center 4. Carotid stenosis stable  continue medical therapy dual antiplatelet lipid-lowering blood pressure control followed by vascular surgery. 5. Implanted loop recorder stable followed in our practice he has had no documented arrhythmia to suggest a cardiac etiology of his stroke continue current treatment including dual antiplatelet and no indication for anticoagulation  COVID-19 Education: The signs and symptoms of COVID-19 were discussed with the patient and how to seek care for testing (follow up with PCP or arrange E-visit).   The importance of social distancing was discussed today.  Time:   Today, I have spent 30 minutes with the patient with telehealth technology discussing the above problems.     Medication Adjustments/Labs and Tests Ordered: Current medicines are reviewed at length with the patient today.  Concerns regarding medicines are outlined above.   Tests Ordered: No orders of the defined types were placed in this encounter.   Medication Changes: No orders of the defined types were placed in this encounter.   Disposition:  Follow up in 3 month(s)  Signed, Shirlee More, MD  06/24/2018 10:39 AM    [facility]

## 2018-06-24 ENCOUNTER — Telehealth (INDEPENDENT_AMBULATORY_CARE_PROVIDER_SITE_OTHER): Payer: Medicare Other | Admitting: Cardiology

## 2018-06-24 ENCOUNTER — Other Ambulatory Visit: Payer: Self-pay

## 2018-06-24 ENCOUNTER — Encounter: Payer: Self-pay | Admitting: Cardiology

## 2018-06-24 VITALS — BP 125/62 | HR 52 | Ht 68.0 in | Wt 160.3 lb

## 2018-06-24 DIAGNOSIS — I25118 Atherosclerotic heart disease of native coronary artery with other forms of angina pectoris: Secondary | ICD-10-CM

## 2018-06-24 DIAGNOSIS — I110 Hypertensive heart disease with heart failure: Secondary | ICD-10-CM

## 2018-06-24 DIAGNOSIS — E782 Mixed hyperlipidemia: Secondary | ICD-10-CM

## 2018-06-24 DIAGNOSIS — Z7189 Other specified counseling: Secondary | ICD-10-CM

## 2018-06-24 DIAGNOSIS — I6521 Occlusion and stenosis of right carotid artery: Secondary | ICD-10-CM

## 2018-06-24 NOTE — Patient Instructions (Addendum)
Medication Instructions:  Your physician recommends that you continue on your current medications as directed. Please refer to the Current Medication list given to you today.  If you need a refill on your cardiac medications before your next appointment, please call your pharmacy.   Lab work: None  If you have labs (blood work) drawn today and your tests are completely normal, you will receive your results only by: [name] MyChart Message (if you have MyChart) OR . A paper copy in the mail If you have any lab test that is abnormal or we need to change your treatment, we will call you to review the results.  Testing/Procedures: None  Follow-Up: At [facility], you and your health needs are our priority.  As part of our continuing mission to provide you with exceptional heart care, we have created designated Provider Care Teams.  These Care Teams include your primary Cardiologist (physician) and Advanced Practice Providers (APPs -  Physician Assistants and Nurse Practitioners) who all work together to provide you with the care you need, when you need it. You will need a follow up virtaul appointment in 3 months: Wednesday, 09/16/2018, at 9:00 am.

## 2018-07-13 ENCOUNTER — Other Ambulatory Visit: Payer: Self-pay

## 2018-07-13 ENCOUNTER — Ambulatory Visit (INDEPENDENT_AMBULATORY_CARE_PROVIDER_SITE_OTHER): Payer: Medicare Other | Admitting: *Deleted

## 2018-07-13 DIAGNOSIS — I639 Cerebral infarction, unspecified: Secondary | ICD-10-CM | POA: Diagnosis not present

## 2018-07-13 LAB — CUP PACEART REMOTE DEVICE CHECK
Date Time Interrogation Session: 20200517181150
Implantable Pulse Generator Implant Date: 20170321

## 2018-07-22 NOTE — Progress Notes (Signed)
Carelink Summary Report / Loop Recorder 

## 2018-08-05 ENCOUNTER — Other Ambulatory Visit: Payer: Self-pay | Admitting: Cardiology

## 2018-08-06 ENCOUNTER — Other Ambulatory Visit: Payer: Self-pay

## 2018-08-06 DIAGNOSIS — I6521 Occlusion and stenosis of right carotid artery: Secondary | ICD-10-CM

## 2018-08-07 ENCOUNTER — Encounter: Payer: Self-pay | Admitting: Nephrology

## 2018-08-07 ENCOUNTER — Other Ambulatory Visit: Payer: Self-pay | Admitting: Urology

## 2018-08-07 ENCOUNTER — Other Ambulatory Visit (HOSPITAL_COMMUNITY): Payer: Self-pay | Admitting: Urology

## 2018-08-07 DIAGNOSIS — C61 Malignant neoplasm of prostate: Secondary | ICD-10-CM

## 2018-08-10 ENCOUNTER — Other Ambulatory Visit: Payer: Self-pay

## 2018-08-10 ENCOUNTER — Encounter: Payer: Self-pay | Admitting: Surgery

## 2018-08-10 ENCOUNTER — Ambulatory Visit (INDEPENDENT_AMBULATORY_CARE_PROVIDER_SITE_OTHER): Payer: Medicare Other | Admitting: Surgery

## 2018-08-10 ENCOUNTER — Ambulatory Visit (HOSPITAL_COMMUNITY)
Admission: RE | Admit: 2018-08-10 | Discharge: 2018-08-10 | Disposition: A | Discharge status: Home / Self Care | Payer: Medicare Other | Source: Ambulatory Visit | Attending: Surgery | Admitting: Surgery

## 2018-08-10 VITALS — BP 160/62 | HR 60 | Temp 97.3°F | Resp 20 | Ht 68.0 in | Wt 165.0 lb

## 2018-08-10 DIAGNOSIS — N186 End stage renal disease: Secondary | ICD-10-CM | POA: Diagnosis not present

## 2018-08-10 DIAGNOSIS — I70213 Atherosclerosis of native arteries of extremities with intermittent claudication, bilateral legs: Secondary | ICD-10-CM | POA: Diagnosis not present

## 2018-08-10 DIAGNOSIS — I6521 Occlusion and stenosis of right carotid artery: Secondary | ICD-10-CM | POA: Insufficient documentation

## 2018-08-10 DIAGNOSIS — Z992 Dependence on renal dialysis: Secondary | ICD-10-CM

## 2018-08-10 NOTE — Progress Notes (Signed)
[facility] [place]  Patient name: Robert [name]. MRN: [id number] DOB: 07/18/1935 Sex: male   REASON FOR VISIT:    Follow up  HISOTRY OF PRESENT ILLNESS:    Robert [name] a 83 y.o.malewho is status post right carotid endarterectomy with patch angioplasty on 06/14/2016 for symptomatic stenosis. I have also performed a right radiocephalic fistula as well as elevation and branch ligation. He went on to have angioplasty of the stenosis.  This ultimately failed and then he had a first stage left basilic vein fistula on 08/14/5091 and second stage on 12-10-2017.  He is on HD.  He continues to take a statin for hypercholesterolemia.  He has lost weight during the pandemic but feels it is due to his diet.  He denies any new neurologic symptoms   PAST MEDICAL HISTORY:   Past Medical History:  Diagnosis Date  . Allergy    year round  . Anemia   . Cancer (HCC)    PSA ELEVATED  . Carotid artery occlusion   . Cataract   . Depression   . Diabetes mellitus   . Dyspnea   . ESRD (end stage renal disease) [facility])    [place]  . GERD (gastroesophageal reflux disease)   . Hearing deficit   . History of kidney stones   . Hyperlipidemia   . Hypertension   . Hypothyroidism   . Myocardial infarction ([place]) 2011  . Stroke (cerebrum) (HCC)    weakness in left hand  04/2015, 05/20/16  . Thyroid disease    hypothyroid     FAMILY HISTORY:   Family History  Problem Relation Age of Onset  . Diabetes Mother   . Dementia Maternal Grandfather   . Heart attack Neg Hx     SOCIAL HISTORY:   Social History   Tobacco Use  . Smoking status: Former Research scientist (life sciences)  . Smokeless tobacco: Never Used  . Tobacco comment: quit age 82  Substance Use Topics  . Alcohol use: Yes    Alcohol/week: 2.0 standard drinks    Types: 2 Glasses of wine per week    Comment: rarely     ALLERGIES:   Allergies  Allergen Reactions  .  Lisinopril Other (See Comments)    "made me lose my voice"     CURRENT MEDICATIONS:   Current Outpatient Medications  Medication Sig Dispense Refill  . ACCU-CHEK AVIVA PLUS test strip     . aspirin 81 MG tablet Take 1 tablet (81 mg total) by mouth daily. 30 tablet 0  . B Complex-C-Folic Acid (DIALYVITE 267) 0.8 MG TABS Take 1 tablet by mouth every evening.   3  . calcium acetate (PHOSLO) 667 MG tablet Take 1,334 mg by mouth See admin instructions. Take 2 tablets (1334 mg) by mouth daily with any meals or snacks.  5  . clopidogrel (PLAVIX) 75 MG tablet Take 1 tablet (75 mg total) by mouth daily. 30 tablet 1  . ezetimibe (ZETIA) 10 MG tablet Take 1 tablet (10 mg total) by mouth daily. 30 tablet 1  . fexofenadine (ALLEGRA) 180 MG tablet Take 180 mg by mouth daily.    . hydrALAZINE (APRESOLINE) 100 MG tablet Take 100 mg by mouth See admin instructions. Sunday, Monday, Wednesday & Friday patient takes 1 tablet (100 mg) by mouth twice daily. Tuesday, Thursday, & Saturdays take at bedtime    . insulin degludec (TRESIBA FLEXTOUCH) 100 UNIT/ML SOPN FlexTouch Pen Inject 15 Units into the skin daily.     [name]  levothyroxine (SYNTHROID, LEVOTHROID) 50 MCG tablet Take 50 mcg by mouth daily before breakfast.     . metoprolol succinate (TOPROL-XL) 50 MG 24 hr tablet TAKE 1 TABLET BY MOUTH IN  THE EVENING TUESDAY,  THURSDAY, SATURDAY AND 1  TABLET TWICE DAILY ALL  OTHER DAYS 90 tablet 0  . Multiple Vitamins-Minerals (PRESERVISION AREDS 2 PO) Take 1 capsule by mouth every evening.    . pantoprazole (PROTONIX) 40 MG tablet Take 40 mg by mouth every evening.     . sertraline (ZOLOFT) 25 MG tablet     . simvastatin (ZOCOR) 40 MG tablet Take 1 tablet (40 mg total) by mouth daily at 6 PM. 30 tablet 0  . sodium bicarbonate 650 MG tablet Take 650 mg by mouth 2 (two) times daily.     No current facility-administered medications for this visit.     REVIEW OF SYSTEMS:   [X]  denotes positive finding, [ ]  denotes  negative finding Cardiac  Comments:  Chest pain or chest pressure:    Shortness of breath upon exertion:    Short of breath when lying flat:    Irregular heart rhythm:        Vascular    Pain in calf, thigh, or hip brought on by ambulation:    Pain in feet at night that wakes you up from your sleep:     Blood clot in your veins:    Leg swelling:         Pulmonary    Oxygen at home:    Productive cough:     Wheezing:         Neurologic    Sudden weakness in arms or legs:     Sudden numbness in arms or legs:     Sudden onset of difficulty speaking or slurred speech:    Temporary loss of vision in one eye:     Problems with dizziness:         Gastrointestinal    Blood in stool:     Vomited blood:         Genitourinary    Burning when urinating:     Blood in urine:        Psychiatric    Major depression:         Hematologic    Bleeding problems:    Problems with blood clotting too easily:        Skin    Rashes or ulcers:        Constitutional    Fever or chills:      PHYSICAL EXAM:   Vitals:   08/10/18 1001  BP: (!) 160/62  Pulse: 60  Resp: 20  Temp: (!) 97.3 F (36.3 C)  SpO2: 98%  Weight: 74.8 kg  Height: 5\' 8"  (1.727 m)    GENERAL: The patient is a well-nourished male, in no acute distress. The vital signs are documented above. CARDIAC: There is a regular rate and rhythm.  VASCULAR: excellent thrill in L BVT PULMONARY: Non-labored respirations MUSCULOSKELETAL: There are no major deformities or cyanosis. NEUROLOGIC: No focal weakness or paresthesias are detected. SKIN: There are no ulcers or rashes noted. PSYCHIATRIC: The patient has a normal affect.  STUDIES:   I have ordered and reviewed the following studies:  CAROTID DUPLEX: Right Carotid: Velocities in the right ICA are consistent with a 40-59%                stenosis. Non-hemodynamically significant plaque <50% noted in  the CCA. The ECA appears <50% stenosed. Unable to  reproduce                higher velocities from previous study in the right internal                carotid artery. Acoustic shadowing may obscure elevated                velocities.  Left Carotid: Velocities in the left ICA are consistent with a 40-59% stenosis.               Non-hemodynamically significant plaque noted in the CCA. The ECA               appears <50% stenosed.  MEDICAL ISSUES:   Stable carotid exam.  I will repeat duplex in 1 year.    Leia Alf, MD, FACS [facility] [phone] Pager [phone]

## 2018-08-14 ENCOUNTER — Ambulatory Visit (INDEPENDENT_AMBULATORY_CARE_PROVIDER_SITE_OTHER): Payer: Medicare Other | Admitting: *Deleted

## 2018-08-14 DIAGNOSIS — I5032 Chronic diastolic (congestive) heart failure: Secondary | ICD-10-CM

## 2018-08-15 LAB — CUP PACEART REMOTE DEVICE CHECK
Date Time Interrogation Session: 20200619181059
Implantable Pulse Generator Implant Date: 20170321

## 2018-08-19 NOTE — Progress Notes (Signed)
Carelink Summary Report / Loop Recorder 

## 2018-08-21 ENCOUNTER — Ambulatory Visit (HOSPITAL_COMMUNITY): Payer: Medicare Other

## 2018-08-21 ENCOUNTER — Other Ambulatory Visit (HOSPITAL_COMMUNITY): Payer: Medicare Other

## 2018-09-01 ENCOUNTER — Encounter (HOSPITAL_COMMUNITY): Payer: Medicare Other

## 2018-09-02 ENCOUNTER — Encounter (HOSPITAL_COMMUNITY)
Admission: RE | Admit: 2018-09-02 | Discharge: 2018-09-02 | Disposition: A | Discharge status: Home / Self Care | Payer: Medicare Other | Source: Ambulatory Visit | Attending: Urology | Admitting: Urology

## 2018-09-02 ENCOUNTER — Ambulatory Visit (HOSPITAL_COMMUNITY)
Admission: RE | Admit: 2018-09-02 | Discharge: 2018-09-02 | Disposition: A | Discharge status: Home / Self Care | Payer: Medicare Other | Source: Ambulatory Visit | Attending: Urology | Admitting: Urology

## 2018-09-02 ENCOUNTER — Other Ambulatory Visit: Payer: Self-pay

## 2018-09-02 DIAGNOSIS — C61 Malignant neoplasm of prostate: Secondary | ICD-10-CM

## 2018-09-02 MED ORDER — TECHNETIUM TC 99M MEDRONATE IV KIT
19.5000 | PACK | Freq: Once | INTRAVENOUS | Status: AC | PRN
  Administered 2018-09-02: 19.5 via INTRAVENOUS

## 2018-09-15 NOTE — Progress Notes (Deleted)
Cardiology Office Note:    Date:  09/15/2018   ID:  Robert Sarks., DOB [birth year], MRN [id number]  PCP:  Burnard Bunting, MD  Cardiologist:  Shirlee More, MD    Referring MD: Burnard Bunting, MD    ASSESSMENT:    No diagnosis found. PLAN:    In order of problems listed above:  1. ***   Next appointment: ***   Medication Adjustments/Labs and Tests Ordered: Current medicines are reviewed at length with the patient today.  Concerns regarding medicines are outlined above.  No orders of the defined types were placed in this encounter.  No orders of the defined types were placed in this encounter.   No chief complaint on file.   History of Present Illness:    Robert [name]. is a 83 y.o. male with a hx of CAD EF 55-60% with  PCI and stent 2011 LCEA hypertension hyperlipidemia T2 DM and stage 5 CKD with RRT  last seen 06/24/2018. He has an IRL after stroke. Recent download 08/14/2018 : Carelink summary report received. Battery status OK. Normal device function. No new symptom episodes, tachy episodes, brady, or pause episodes. No new AF episodes.  Compliance with diet, lifestyle and medications: *** Past Medical History:  Diagnosis Date  . Allergy    year round  . Anemia   . Cancer (HCC)    PSA ELEVATED  . Carotid artery occlusion   . Cataract   . Depression   . Diabetes mellitus   . Dyspnea   . ESRD (end stage renal disease) [facility])    [place]  . GERD (gastroesophageal reflux disease)   . Hearing deficit   . History of kidney stones   . Hyperlipidemia   . Hypertension   . Hypothyroidism   . Myocardial infarction ([place]) 2011  . Stroke (cerebrum) (HCC)    weakness in left hand  04/2015, 05/20/16  . Thyroid disease    hypothyroid    Past Surgical History:  Procedure Laterality Date  . A/V FISTULAGRAM Right 06/24/2017   Procedure: A/V FISTULAGRAM;  Surgeon: Serafina Mitchell, MD;  Location: [place] [facility];  Service: Cardiovascular;   Laterality: Right;  rt  lower arm  . AV FISTULA PLACEMENT Right 07/25/2016   Procedure: ARTERIOVENOUS (AV) FISTULA CREATION-RIGHT;  Surgeon: Serafina Mitchell, MD;  Location: [place];  Service: Vascular;  Laterality: Right;  . BASCILIC VEIN TRANSPOSITION Left 10/15/2017   Procedure: FIRST STAGE BASILIC VEIN TRANSPOSITION LEFT UPPER EXTREMITY;  Surgeon: Serafina Mitchell, MD;  Location: [place];  Service: Vascular;  Laterality: Left;  . BASCILIC VEIN TRANSPOSITION Left 12/10/2017   Procedure: SECOND STAGE BASILIC VEIN TRANSPOSITION LEFT ARM;  Surgeon: Serafina Mitchell, MD;  [facility];  Service: Vascular;  Laterality: Left;  . COLONOSCOPY    . CORONARY ANGIOPLASTY WITH STENT PLACEMENT  07/2009   3 stents  . ELBOW SURGERY     LEFT  . ENDARTERECTOMY Right 06/06/2016   Procedure: RIGHT CAROTID ENDARTERECTOMY WITH PATCH ANGIOPLASTY;  Surgeon: Serafina Mitchell, MD;  Location: [place];  Service: Vascular;  Laterality: Right;  . EP IMPLANTABLE DEVICE N/A 05/16/2015   Procedure: Loop Recorder Insertion;  Surgeon: Thompson Grayer, MD;  Location: [place] [facility];  Service: Cardiovascular;  Laterality: N/A;  . ESOPHAGOSCOPY WITH DILITATION    . FISTULA SUPERFICIALIZATION Right 12/05/2016   Procedure: Superficialization and Ligation of Branches OF RIGHT ARM RADIOCEPHALIC FISTULA;  Surgeon: Serafina Mitchell, MD;  Location: [facility]  OR;  Service: Vascular;  Laterality: Right;  . NOSE SURGERY    . PERIPHERAL VASCULAR BALLOON ANGIOPLASTY  06/24/2017   Procedure: PERIPHERAL VASCULAR BALLOON ANGIOPLASTY;  Surgeon: Serafina Mitchell, MD;  Location: [place] [facility];  Service: Cardiovascular;;  rt arm fistula   . stents    . TEE WITHOUT CARDIOVERSION N/A 05/16/2015   Procedure: TRANSESOPHAGEAL ECHOCARDIOGRAM (TEE);  Surgeon: Thayer Headings, MD;  Location: [facility] ENDOSCOPY;  Service: Cardiovascular;  Laterality: N/A;    Current Medications: No outpatient medications have been marked as taking for the 09/16/18 encounter  (Appointment) with Richardo Priest, MD.     Allergies:   Lisinopril   Social History   Socioeconomic History  . Marital status: Married    Spouse name: Not on file  . Number of children: Not on file  . Years of education: Not on file  . Highest education level: Not on file  Occupational History  . Not on file  Social Needs  . Financial resource strain: Not on file  . Food insecurity    Worry: Not on file    Inability: Not on file  . Transportation needs    Medical: Not on file    Non-medical: Not on file  Tobacco Use  . Smoking status: Former Research scientist (life sciences)  . Smokeless tobacco: Never Used  . Tobacco comment: quit age 36  Substance and Sexual Activity  . Alcohol use: Yes    Alcohol/week: 2.0 standard drinks    Types: 2 Glasses of wine per week    Comment: rarely  . Drug use: No  . Sexual activity: Not on file  Lifestyle  . Physical activity    Days per week: Not on file    Minutes per session: Not on file  . Stress: Not on file  Relationships  . Social Herbalist on phone: Not on file    Gets together: Not on file    Attends religious service: Not on file    Active member of club or organization: Not on file    Attends meetings of clubs or organizations: Not on file    Relationship status: Not on file  Other Topics Concern  . Not on file  Social History Narrative  . Not on file     Family History: The patient's ***family history includes Dementia in his maternal grandfather; Diabetes in his mother. There is no history of Heart attack. ROS:   Please see the history of present illness.    All other systems reviewed and are negative.  EKGs/Labs/Other Studies Reviewed:    The following studies were reviewed today:  EKG:  EKG ordered today and personally reviewed.  The ekg ordered today demonstrates ***  Recent Labs: 12/10/2017: Hemoglobin 9.2; Potassium 3.8; Sodium 141  Recent Lipid Panel    Component Value Date/Time   CHOL 135 05/24/2016 1022    TRIG 367 (H) 05/24/2016 1022   HDL 26 (L) 05/24/2016 1022   CHOLHDL 5.2 05/24/2016 1022   VLDL 73 (H) 05/24/2016 1022   LDLCALC 36 05/24/2016 1022    Physical Exam:    VS:  There were no vitals taken for this visit.    Wt Readings from Last 3 Encounters:  08/10/18 165 lb (74.8 kg)  06/24/18 160 lb 4.4 oz (72.7 kg)  12/26/17 163 lb 1.9 oz (74 kg)     GEN: *** Well nourished, well developed in no acute distress HEENT: Normal NECK: No JVD; No carotid bruits LYMPHATICS:  No lymphadenopathy CARDIAC: ***RRR, no murmurs, rubs, gallops RESPIRATORY:  Clear to auscultation without rales, wheezing or rhonchi  ABDOMEN: Soft, non-tender, non-distended MUSCULOSKELETAL:  No edema; No deformity  SKIN: Warm and dry NEUROLOGIC:  Alert and oriented x 3 PSYCHIATRIC:  Normal affect    Signed, Shirlee More, MD  09/15/2018 12:36 PM    [facility]

## 2018-09-16 ENCOUNTER — Other Ambulatory Visit: Payer: Self-pay

## 2018-09-16 ENCOUNTER — Telehealth: Payer: Medicare Other | Admitting: Cardiology

## 2018-09-16 ENCOUNTER — Ambulatory Visit (INDEPENDENT_AMBULATORY_CARE_PROVIDER_SITE_OTHER): Payer: Medicare Other | Admitting: *Deleted

## 2018-09-16 DIAGNOSIS — I5032 Chronic diastolic (congestive) heart failure: Secondary | ICD-10-CM | POA: Diagnosis not present

## 2018-09-17 LAB — CUP PACEART REMOTE DEVICE CHECK
Date Time Interrogation Session: 20200722180829
Implantable Pulse Generator Implant Date: 20170321

## 2018-09-23 ENCOUNTER — Other Ambulatory Visit: Payer: Self-pay | Admitting: Cardiology

## 2018-09-30 NOTE — Progress Notes (Signed)
Carelink Summary Report / Loop Recorder 

## 2018-10-04 NOTE — Progress Notes (Signed)
Cardiology Office Note:    Date:  10/05/2018   ID:  [name], DOB 03-04-1935, MRN [id number]  PCP:  Burnard Bunting, MD  Cardiologist:  Shirlee More, MD    Referring MD: Burnard Bunting, MD    ASSESSMENT:    1. Coronary artery disease of native artery of native heart with stable angina pectoris ([place])   2. Hypertensive heart disease with heart failure ([place])   3. Mixed hyperlipidemia   4. Cerebrovascular accident (CVA), unspecified mechanism ([place])    PLAN:    In order of problems listed above:  1. Stable CAD continue medical treatment at this time I would not advise an ischemia evaluation He is on dual antiplatelet beta-blocker and statin Stable continue his current antihypertensives no fluid overload with hemodialysis Stable continue statin check liver function lipids He has implanted loop recorder to screen for atrial fibrillation will continue to managed with device clinic  Next appointment: 6 months   Medication Adjustments/Labs and Tests Ordered: Current medicines are reviewed at length with the patient today.  Concerns regarding medicines are outlined above.  No orders of the defined types were placed in this encounter.  No orders of the defined types were placed in this encounter.   Chief Complaint  Patient presents with  . Follow-up    with ILR  . Coronary Artery Disease  . Hypertension  . Hyperlipidemia    History of Present Illness:    Robert [name]. is a 83 y.o. male with a hx of CAD EF 55-60% with  PCI and stent 2011 RCEA hypertension hyperlipidemia T2 DM and stage 5 CKD with RRT last seen 06/24/2018.  He has an implanted loop recorder to screen for atrial fibrillation associated with stroke last device check 09/16/2018 no arrhythmia no episodes of atrial fibrillation.  He was recently seen and evaluated had a carotid duplex performed and was felt to be stable by vascular surgery with no further intervention recommended. Compliance with  diet, lifestyle and medications: yes Labs today CMP and lipid profile  Today is a weekend after with dialysis tomorrow.  He feels tired all the time particularly when he is 2 days off from his renal hemodialysis.  No chest pain shortness of breath edema palpitation or syncope.  His last labs were performed July 2019 LDL 63 cholesterol 119 A1c 7.1 creatinine 3.7 we will recheck today had an EKG last visit  Past Medical History:  Diagnosis Date  . Allergy    year round  . Anemia   . Cancer (HCC)    PSA ELEVATED  . Carotid artery occlusion   . Cataract   . Depression   . Diabetes mellitus   . Dyspnea   . ESRD (end stage renal disease) [facility])    [place]  . GERD (gastroesophageal reflux disease)   . Hearing deficit   . History of kidney stones   . Hyperlipidemia   . Hypertension   . Hypothyroidism   . Myocardial infarction ([place]) 2011  . Stroke (cerebrum) (HCC)    weakness in left hand  04/2015, 05/20/16  . Thyroid disease    hypothyroid    Past Surgical History:  Procedure Laterality Date  . A/V FISTULAGRAM Right 06/24/2017   Procedure: A/V FISTULAGRAM;  Surgeon: Serafina Mitchell, MD;  Location: [place] [facility];  Service: Cardiovascular;  Laterality: Right;  rt  lower arm  . AV FISTULA PLACEMENT Right 07/25/2016   Procedure: ARTERIOVENOUS (AV) FISTULA CREATION-RIGHT;  Surgeon: Trula Slade,  Butch Penny, MD;  Location: [place];  Service: Vascular;  Laterality: Right;  . BASCILIC VEIN TRANSPOSITION Left 10/15/2017   Procedure: FIRST STAGE BASILIC VEIN TRANSPOSITION LEFT UPPER EXTREMITY;  Surgeon: Serafina Mitchell, MD;  Location: [place];  Service: Vascular;  Laterality: Left;  . BASCILIC VEIN TRANSPOSITION Left 12/10/2017   Procedure: SECOND STAGE BASILIC VEIN TRANSPOSITION LEFT ARM;  Surgeon: Serafina Mitchell, MD;  [facility];  Service: Vascular;  Laterality: Left;  . COLONOSCOPY    . CORONARY ANGIOPLASTY WITH STENT PLACEMENT  07/2009   3 stents  . ELBOW SURGERY     LEFT  .  ENDARTERECTOMY Right 06/06/2016   Procedure: RIGHT CAROTID ENDARTERECTOMY WITH PATCH ANGIOPLASTY;  Surgeon: Serafina Mitchell, MD;  Location: [place];  Service: Vascular;  Laterality: Right;  . EP IMPLANTABLE DEVICE N/A 05/16/2015   Procedure: Loop Recorder Insertion;  Surgeon: Thompson Grayer, MD;  Location: [place] [facility];  Service: Cardiovascular;  Laterality: N/A;  . ESOPHAGOSCOPY WITH DILITATION    . FISTULA SUPERFICIALIZATION Right 12/05/2016   Procedure: Superficialization and Ligation of Branches OF RIGHT ARM RADIOCEPHALIC FISTULA;  Surgeon: Serafina Mitchell, MD;  Location: [place];  Service: Vascular;  Laterality: Right;  . NOSE SURGERY    . PERIPHERAL VASCULAR BALLOON ANGIOPLASTY  06/24/2017   Procedure: PERIPHERAL VASCULAR BALLOON ANGIOPLASTY;  Surgeon: Serafina Mitchell, MD;  Location: [place] [facility];  Service: Cardiovascular;;  rt arm fistula   . stents    . TEE WITHOUT CARDIOVERSION N/A 05/16/2015   Procedure: TRANSESOPHAGEAL ECHOCARDIOGRAM (TEE);  Surgeon: Thayer Headings, MD;  Location: [facility] ENDOSCOPY;  Service: Cardiovascular;  Laterality: N/A;    Current Medications: No outpatient medications have been marked as taking for the 10/05/18 encounter (Office Visit) with Richardo Priest, MD.     Allergies:   Lisinopril   Social History   Socioeconomic History  . Marital status: Married    Spouse name: Not on file  . Number of children: Not on file  . Years of education: Not on file  . Highest education level: Not on file  Occupational History  . Not on file  Social Needs  . Financial resource strain: Not on file  . Food insecurity    Worry: Not on file    Inability: Not on file  . Transportation needs    Medical: Not on file    Non-medical: Not on file  Tobacco Use  . Smoking status: Former Smoker    Types: Cigarettes  . Smokeless tobacco: Never Used  . Tobacco comment: quit age 62  Substance and Sexual Activity  . Alcohol use: Yes    Alcohol/week: 2.0 standard drinks     Types: 2 Glasses of wine per week    Comment: rarely  . Drug use: No  . Sexual activity: Not on file  Lifestyle  . Physical activity    Days per week: Not on file    Minutes per session: Not on file  . Stress: Not on file  Relationships  . Social Herbalist on phone: Not on file    Gets together: Not on file    Attends religious service: Not on file    Active member of club or organization: Not on file    Attends meetings of clubs or organizations: Not on file    Relationship status: Not on file  Other Topics Concern  . Not on file  Social History Narrative  . Not on  file     Family History: The patient's family history includes Dementia in his maternal grandfather; Diabetes in his mother. There is no history of Heart attack. ROS:   Please see the history of present illness.    All other systems reviewed and are negative.  EKGs/Labs/Other Studies Reviewed:    The following studies were reviewed today  Recent Labs: 12/10/2017: Hemoglobin 9.2; Potassium 3.8; Sodium 141  Recent Lipid Panel    Component Value Date/Time   CHOL 135 05/24/2016 1022   TRIG 367 (H) 05/24/2016 1022   HDL 26 (L) 05/24/2016 1022   CHOLHDL 5.2 05/24/2016 1022   VLDL 73 (H) 05/24/2016 1022   LDLCALC 36 05/24/2016 1022    Physical Exam:    VS:  BP 116/68 (BP Location: Right Arm, Patient Position: Sitting, Cuff Size: Normal)   Pulse 74   Ht 5\' 8"  (1.727 m)   Wt 164 lb 6.4 oz (74.6 kg)   SpO2 96%   BMI 25.00 kg/m     Wt Readings from Last 3 Encounters:  10/05/18 164 lb 6.4 oz (74.6 kg)  08/10/18 165 lb (74.8 kg)  06/24/18 160 lb 4.4 oz (72.7 kg)     GEN:  Well nourished, well developed in no acute distress HEENT: Normal NECK: No JVD; No carotid bruits LYMPHATICS: No lymphadenopathy CARDIAC: 1/6 SEM aortic areaRRR, no murmurs, rubs, gallops RESPIRATORY:  Clear to auscultation without rales, wheezing or rhonchi  ABDOMEN: Soft, non-tender, non-distended MUSCULOSKELETAL:   No edema; No deformity  SKIN: Warm and dry NEUROLOGIC:  Alert and oriented x 3 PSYCHIATRIC:  Normal affect    Signed, Shirlee More, MD  10/05/2018 1:30 PM    [place]

## 2018-10-05 ENCOUNTER — Other Ambulatory Visit: Payer: Self-pay

## 2018-10-05 ENCOUNTER — Encounter: Payer: Self-pay | Admitting: Cardiology

## 2018-10-05 ENCOUNTER — Ambulatory Visit (INDEPENDENT_AMBULATORY_CARE_PROVIDER_SITE_OTHER): Payer: Medicare Other | Admitting: Cardiology

## 2018-10-05 VITALS — BP 116/68 | HR 74 | Ht 68.0 in | Wt 164.4 lb

## 2018-10-05 DIAGNOSIS — I639 Cerebral infarction, unspecified: Secondary | ICD-10-CM

## 2018-10-05 DIAGNOSIS — E782 Mixed hyperlipidemia: Secondary | ICD-10-CM | POA: Diagnosis not present

## 2018-10-05 DIAGNOSIS — I25118 Atherosclerotic heart disease of native coronary artery with other forms of angina pectoris: Secondary | ICD-10-CM | POA: Diagnosis not present

## 2018-10-05 DIAGNOSIS — I110 Hypertensive heart disease with heart failure: Secondary | ICD-10-CM

## 2018-10-05 NOTE — Patient Instructions (Signed)
Medication Instructions:  No changes to your present medication.   If you need a refill on your cardiac medications before your next appointment, please call your pharmacy.   Lab work: CMP, lipid panel drawn today.   If you have labs (blood work) drawn today and your tests are completely normal, you will receive your results only by: [name] MyChart Message (if you have MyChart) OR . A paper copy in the mail If you have any lab test that is abnormal or we need to change your treatment, we will call you to review the results.  Testing/Procedures: None ordered today.   Follow-Up: At [facility], you and your health needs are our priority.  As part of our continuing mission to provide you with exceptional heart care, we have created designated Provider Care Teams.  These Care Teams include your primary Cardiologist (physician) and Advanced Practice Providers (APPs -  Physician Assistants and Nurse Practitioners) who all work together to provide you with the care you need, when you need it. You will need a follow up appointment in 6 months.  Please call our office 2 months in advance to schedule this appointment.  You may see Shirlee More, MD or another member of our [facility] Provider Team in [place]: Jenne Campus, MD . Jyl Heinz, MD  Any Other Special Instructions Will Be Listed Below (If Applicable).  Continue to follow closely with your PCP and nephrologist. It was a pleasure to see you today!

## 2018-10-06 LAB — COMPREHENSIVE METABOLIC PANEL
ALT: 6 IU/L (ref 0–44)
AST: 8 IU/L (ref 0–40)
Albumin/Globulin Ratio: 1.9 (ref 1.2–2.2)
Albumin: 4 g/dL (ref 3.7–4.7)
Alkaline Phosphatase: 121 IU/L — ABNORMAL HIGH (ref 39–117)
BUN/Creatinine Ratio: 7 — ABNORMAL LOW (ref 10–24)
BUN: 48 mg/dL — ABNORMAL HIGH (ref 8–27)
Bilirubin Total: <0.2 mg/dL (ref 0.0–1.2)
CO2: 22 mmol/L (ref 20–29)
Calcium: 9.1 mg/dL (ref 8.6–10.2)
Chloride: 99 mmol/L (ref 96–106)
Creatinine, Ser: 7.09 mg/dL — ABNORMAL HIGH (ref 0.76–1.27)
GFR calc Af Amer: 8 mL/min/{1.73_m2} — ABNORMAL LOW (ref >59)
GFR calc non Af Amer: 7 mL/min/{1.73_m2} — ABNORMAL LOW (ref >59)
Globulin, Total: 2.1 g/dL (ref 1.5–4.5)
Glucose: 322 mg/dL — ABNORMAL HIGH (ref 65–99)
Potassium: 4 mmol/L (ref 3.5–5.2)
Sodium: 140 mmol/L (ref 134–144)
Total Protein: 6.1 g/dL (ref 6.0–8.5)

## 2018-10-06 LAB — LIPID PANEL
Chol/HDL Ratio: 6.9 ratio — ABNORMAL HIGH (ref 0.0–5.0)
Cholesterol, Total: 194 mg/dL (ref 100–199)
HDL: 28 mg/dL — ABNORMAL LOW (ref >39)
Triglycerides: 577 mg/dL (ref 0–149)

## 2018-10-07 ENCOUNTER — Telehealth: Payer: Self-pay | Admitting: *Deleted

## 2018-10-07 DIAGNOSIS — E782 Mixed hyperlipidemia: Secondary | ICD-10-CM

## 2018-10-07 MED ORDER — OMEGA-3-ACID ETHYL ESTERS 1 G PO CAPS: 2.0000 g | ORAL_CAPSULE | Freq: Two times a day (BID) | ORAL | 2 refills | Status: DC

## 2018-10-07 NOTE — Telephone Encounter (Signed)
Telephone call to patient. Spoke with daughter. Informed of lab results and need to start Lovaza 2 caps twice daily.Daughter verbalized understanding

## 2018-10-07 NOTE — Telephone Encounter (Signed)
-----   Message from Richardo Priest, MD sent at 10/06/2018 10:59 AM EDT ----- Normal or stable result  TG very elevated, start lovasa 2 caps BID

## 2018-10-08 NOTE — Addendum Note (Signed)
Addended by: Austin Miles on: 10/08/2018 08:51 AM   Modules accepted: Orders

## 2018-10-13 ENCOUNTER — Emergency Department: Payer: Medicare Other

## 2018-10-13 ENCOUNTER — Other Ambulatory Visit: Payer: Self-pay

## 2018-10-13 ENCOUNTER — Encounter: Payer: Self-pay | Admitting: Emergency Medicine

## 2018-10-13 ENCOUNTER — Emergency Department
Admission: EM | Admit: 2018-10-13 | Discharge: 2018-10-14 | Disposition: A | Discharge status: Home / Self Care | Payer: Medicare Other | Attending: Emergency Medicine | Admitting: Emergency Medicine

## 2018-10-13 DIAGNOSIS — Z7902 Long term (current) use of antithrombotics/antiplatelets: Secondary | ICD-10-CM | POA: Diagnosis not present

## 2018-10-13 DIAGNOSIS — E785 Hyperlipidemia, unspecified: Secondary | ICD-10-CM | POA: Insufficient documentation

## 2018-10-13 DIAGNOSIS — I252 Old myocardial infarction: Secondary | ICD-10-CM | POA: Diagnosis not present

## 2018-10-13 DIAGNOSIS — Z951 Presence of aortocoronary bypass graft: Secondary | ICD-10-CM | POA: Insufficient documentation

## 2018-10-13 DIAGNOSIS — Z7982 Long term (current) use of aspirin: Secondary | ICD-10-CM | POA: Insufficient documentation

## 2018-10-13 DIAGNOSIS — Z794 Long term (current) use of insulin: Secondary | ICD-10-CM | POA: Diagnosis not present

## 2018-10-13 DIAGNOSIS — Z992 Dependence on renal dialysis: Secondary | ICD-10-CM | POA: Insufficient documentation

## 2018-10-13 DIAGNOSIS — Z79899 Other long term (current) drug therapy: Secondary | ICD-10-CM | POA: Diagnosis not present

## 2018-10-13 DIAGNOSIS — I259 Chronic ischemic heart disease, unspecified: Secondary | ICD-10-CM | POA: Diagnosis not present

## 2018-10-13 DIAGNOSIS — N200 Calculus of kidney: Secondary | ICD-10-CM | POA: Insufficient documentation

## 2018-10-13 DIAGNOSIS — I120 Hypertensive chronic kidney disease with stage 5 chronic kidney disease or end stage renal disease: Secondary | ICD-10-CM | POA: Diagnosis not present

## 2018-10-13 DIAGNOSIS — E1122 Type 2 diabetes mellitus with diabetic chronic kidney disease: Secondary | ICD-10-CM | POA: Insufficient documentation

## 2018-10-13 DIAGNOSIS — R1031 Right lower quadrant pain: Secondary | ICD-10-CM | POA: Diagnosis present

## 2018-10-13 DIAGNOSIS — N186 End stage renal disease: Secondary | ICD-10-CM | POA: Insufficient documentation

## 2018-10-13 LAB — URINALYSIS, COMPLETE (UACMP) WITH MICROSCOPIC
Bacteria, UA: NONE SEEN
Bilirubin Urine: NEGATIVE
Glucose, UA: >=500 mg/dL — AB
Hgb urine dipstick: NEGATIVE
Ketones, ur: NEGATIVE mg/dL
Leukocytes,Ua: NEGATIVE
Nitrite: NEGATIVE
Protein, ur: 100 mg/dL — AB
Specific Gravity, Urine: 1.014 (ref 1.005–1.030)
pH: 7 (ref 5.0–8.0)

## 2018-10-13 LAB — CBC
HCT: 33.1 % — ABNORMAL LOW (ref 39.0–52.0)
Hemoglobin: 10.7 g/dL — ABNORMAL LOW (ref 13.0–17.0)
MCH: 30.7 pg (ref 26.0–34.0)
MCHC: 32.3 g/dL (ref 30.0–36.0)
MCV: 94.8 fL (ref 80.0–100.0)
Platelets: 120 10*3/uL — ABNORMAL LOW (ref 150–400)
RBC: 3.49 MIL/uL — ABNORMAL LOW (ref 4.22–5.81)
RDW: 14.8 % (ref 11.5–15.5)
WBC: 4.9 10*3/uL (ref 4.0–10.5)
nRBC: 0 % (ref 0.0–0.2)

## 2018-10-13 LAB — BASIC METABOLIC PANEL
Anion gap: 11 (ref 5–15)
BUN: 23 mg/dL (ref 8–23)
CO2: 27 mmol/L (ref 22–32)
Calcium: 8.8 mg/dL — ABNORMAL LOW (ref 8.9–10.3)
Chloride: 100 mmol/L (ref 98–111)
Creatinine, Ser: 4.35 mg/dL — ABNORMAL HIGH (ref 0.61–1.24)
GFR calc Af Amer: 14 mL/min — ABNORMAL LOW (ref >60)
GFR calc non Af Amer: 12 mL/min — ABNORMAL LOW (ref >60)
Glucose, Bld: 183 mg/dL — ABNORMAL HIGH (ref 70–99)
Potassium: 4.1 mmol/L (ref 3.5–5.1)
Sodium: 138 mmol/L (ref 135–145)

## 2018-10-13 NOTE — ED Triage Notes (Signed)
Pt arrived via POV with reports of right lower back pain that started while driving to his daughters house.   Pt is on dialysis, last treatment was today.  Pt also c/o being tired which he states is common after dialysis.  Pt denies any n/v/d.  Pt does have some tenderness to palpation on the right lower flank area.

## 2018-10-13 NOTE — ED Notes (Signed)
Pt denies n/v/d. Pt states he had dull pain on right flank area later tonight. Pt denies pain now. Pt does make small amount of urine, states all is as it has been with urinating, no new issues.

## 2018-10-14 NOTE — Discharge Instructions (Addendum)
I believe that your pain was caused by a passing kidney stone.  If the pain returns or if you develop abdominal pain, chest pain, numbness or weakness of your legs, or back pain please return to the emergency room for reevaluation.  Otherwise follow-up with your primary care doctor in 2 days.

## 2018-10-14 NOTE — ED Provider Notes (Signed)
[facility] Emergency Department Provider Note  ____________________________________________  Time seen: Approximately 12:03 AM  I have reviewed the triage vital signs and the nursing notes.   HISTORY  Chief Complaint Flank Pain and Fatigue   HPI Robert [name]. is a 83 y.o. male with a history of ESRD on HD, diabetes, anemia, CAD, carotid artery stenosis on Plavix who presents for evaluation of right-sided flank pain.  Patient reports that he was driving  to his daughter's house this evening when he developed sudden onset of dull severe right flank pain.  The pain has now resolved.  He denies nausea, vomiting, dysuria, hematuria, abdominal pain, chest pain, shortness of breath, fever.  No prior history of kidney stones.  Patient reports feeling slightly tired today however this is normal for him as he feels like that every day he has dialysis.  He did undergo full dialysis treatment this morning.  Past Medical History:  Diagnosis Date  . Allergy    year round  . Anemia   . Cancer (HCC)    PSA ELEVATED  . Carotid artery occlusion   . Cataract   . Depression   . Diabetes mellitus   . Dyspnea   . ESRD (end stage renal disease) [facility])    [place]  . GERD (gastroesophageal reflux disease)   . Hearing deficit   . History of kidney stones   . Hyperlipidemia   . Hypertension   . Hypothyroidism   . Myocardial infarction ([place]) 2011  . Stroke (cerebrum) (HCC)    weakness in left hand  04/2015, 05/20/16  . Thyroid disease    hypothyroid    Patient Active Problem List   Diagnosis Date Noted  . CAD (coronary artery disease) 12/26/2017  . ESRD on dialysis ([place]) 12/22/2017  . Carotid stenosis, right 05/21/2016  . AKI (acute kidney injury) ([place]) 05/20/2016  . Dysarthria 05/20/2016  . Thrombocytopenia ([place]) 05/20/2016  . Chronic diastolic CHF (congestive heart failure) ([place]) 05/20/2016  . Slurred speech   . CKD (chronic kidney disease)  stage V requiring chronic dialysis ([place]) 03/20/2016  . Stroke (cerebrum) ([place])   . History of stroke 05/13/2015  . Acute encephalopathy 05/13/2015  . Hypertensive urgency 05/13/2015  . Hyperlipidemia 05/13/2015  . Diabetes mellitus, type II, insulin dependent ([place]) 08/18/2009  . Anemia 08/18/2009  . Hypertensive heart disease with heart failure ([place]) 08/18/2009  . ALLERGIC RHINITIS, SEASONAL 08/18/2009  . PARAPHARYNGEAL ABSCESS 08/18/2009  . GERD 08/18/2009  . HIATAL HERNIA 08/18/2009  . DIVERTICULOSIS, COLON 08/18/2009  . 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 ARTERIOVENOUS MALFORMATION, COLON 08/18/2009  . DYSPHAGIA 08/18/2009  . COLONIC POLYPS, HX OF 08/18/2009  . RENAL CALCULUS, HX OF 08/18/2009  . ESOPHAGEAL STRICTURE 06/21/2009    Past Surgical History:  Procedure Laterality Date  . A/V FISTULAGRAM Right 06/24/2017   Procedure: A/V FISTULAGRAM;  Surgeon: Serafina Mitchell, MD;  Location: [place] [facility];  Service: Cardiovascular;  Laterality: Right;  rt  lower arm  . AV FISTULA PLACEMENT Right 07/25/2016   Procedure: ARTERIOVENOUS (AV) FISTULA CREATION-RIGHT;  Surgeon: Serafina Mitchell, MD;  Location: [place];  Service: Vascular;  Laterality: Right;  . BASCILIC VEIN TRANSPOSITION Left 10/15/2017   Procedure: FIRST STAGE BASILIC VEIN TRANSPOSITION LEFT UPPER EXTREMITY;  Surgeon: Serafina Mitchell, MD;  Location: [place];  Service: Vascular;  Laterality: Left;  . BASCILIC VEIN TRANSPOSITION Left 12/10/2017   Procedure: SECOND STAGE BASILIC VEIN TRANSPOSITION LEFT ARM;  Surgeon:  Serafina Mitchell, MD;  Location: [facility] OR;  Service: Vascular;  Laterality: Left;  . COLONOSCOPY    . CORONARY ANGIOPLASTY WITH STENT PLACEMENT  07/2009   3 stents  . ELBOW SURGERY     LEFT  . ENDARTERECTOMY Right 06/06/2016   Procedure: RIGHT CAROTID ENDARTERECTOMY WITH PATCH ANGIOPLASTY;  Surgeon: Serafina Mitchell, MD;  Location: [place];  Service: Vascular;  Laterality: Right;  . EP  IMPLANTABLE DEVICE N/A 05/16/2015   Procedure: Loop Recorder Insertion;  Surgeon: Thompson Grayer, MD;  Location: [place] [facility];  Service: Cardiovascular;  Laterality: N/A;  . ESOPHAGOSCOPY WITH DILITATION    . FISTULA SUPERFICIALIZATION Right 12/05/2016   Procedure: Superficialization and Ligation of Branches OF RIGHT ARM RADIOCEPHALIC FISTULA;  Surgeon: Serafina Mitchell, MD;  Location: [place];  Service: Vascular;  Laterality: Right;  . NOSE SURGERY    . PERIPHERAL VASCULAR BALLOON ANGIOPLASTY  06/24/2017   Procedure: PERIPHERAL VASCULAR BALLOON ANGIOPLASTY;  Surgeon: Serafina Mitchell, MD;  Location: [place] [facility];  Service: Cardiovascular;;  rt arm fistula   . stents    . TEE WITHOUT CARDIOVERSION N/A 05/16/2015   Procedure: TRANSESOPHAGEAL ECHOCARDIOGRAM (TEE);  Surgeon: Thayer Headings, MD;  Location: [place];  Service: Cardiovascular;  Laterality: N/A;    Prior to Admission medications   Medication Sig Start Date End Date Taking? Authorizing Provider  ACCU-CHEK AVIVA PLUS test strip  12/09/17   [provider]  aspirin 81 MG tablet Take 1 tablet (81 mg total) by mouth daily. 12/26/17   Richardo Priest, MD  calcium acetate (PHOSLO) 667 MG tablet Take 1,334 mg by mouth See admin instructions. Take 2 tablets (1334 mg) by mouth daily with any meals or snacks. 10/14/17   [provider]  clopidogrel (PLAVIX) 75 MG tablet Take 1 tablet (75 mg total) by mouth daily. 05/24/16   Reyne Dumas, MD  ezetimibe (ZETIA) 10 MG tablet Take 1 tablet (10 mg total) by mouth daily. 05/25/16   Reyne Dumas, MD  fexofenadine (ALLEGRA) 180 MG tablet Take 180 mg by mouth daily.    [provider]  folic acid-vitamin b complex-vitamin c-selenium-zinc (DIALYVITE) 3 MG TABS tablet Take 1 tablet by mouth daily.    [provider]  hydrALAZINE (APRESOLINE) 50 MG tablet Take 50 mg by mouth See admin instructions. Sunday, Monday, Wednesday & Friday patient takes 1 tablet (50 mg)  by mouth twice daily. Tuesday, Thursday, & Saturdays take 1 tablet at bedtime 06/13/15   [provider]  insulin degludec (TRESIBA FLEXTOUCH) 100 UNIT/ML SOPN FlexTouch Pen Inject 25 Units into the skin at bedtime.     [provider]  levothyroxine (SYNTHROID, LEVOTHROID) 50 MCG tablet Take 50 mcg by mouth daily before breakfast.  03/03/15   [provider]  metoprolol succinate (TOPROL-XL) 50 MG 24 hr tablet Take 50 mg by mouth. TAKE 1 TABLET BY MOUTH IN  THE EVENING TUESDAY,  THURSDAY, SATURDAY AND 1  TABLET TWICE DAILY ALL  OTHER DAYS    [provider]  pantoprazole (PROTONIX) 40 MG tablet Take 40 mg by mouth every evening.     [provider]  sertraline (ZOLOFT) 20 MG/ML concentrated solution Take 20 mg by mouth daily.  07/27/18   [provider]  simvastatin (ZOCOR) 40 MG tablet Take 1 tablet (40 mg total) by mouth daily at 6 PM. 05/17/15   Velvet Bathe, MD    Allergies Lisinopril  Family History  Problem Relation Age of  Onset  . Diabetes Mother   . Dementia Maternal Grandfather   . Heart attack Neg Hx     Social History Social History   Tobacco Use  . Smoking status: Former Smoker    Types: Cigarettes  . Smokeless tobacco: Never Used  . Tobacco comment: quit age 32  Substance Use Topics  . Alcohol use: Yes    Alcohol/week: 2.0 standard drinks    Types: 2 Glasses of wine per week    Comment: rarely  . Drug use: No    Review of Systems  Constitutional: Negative for fever. Eyes: Negative for visual changes. ENT: Negative for sore throat. Neck: No neck pain  Cardiovascular: Negative for chest pain. Respiratory: Negative for shortness of breath. Gastrointestinal: Negative for abdominal pain, vomiting or diarrhea. Genitourinary: Negative for dysuria. + R flank pain Musculoskeletal: Negative for back pain. Skin: Negative for rash. Neurological: Negative for headaches, weakness or numbness. Psych: No SI or HI   ____________________________________________   PHYSICAL EXAM:  VITAL SIGNS: ED Triage Vitals  Enc Vitals Group     BP 10/13/18 1952 (!) 162/93     Pulse Rate 10/13/18 1952 66     Resp 10/13/18 1952 16     Temp 10/13/18 1952 98.9 F (37.2 C)     Temp Source 10/13/18 1952 Oral     SpO2 10/13/18 1952 98 %     Weight 10/13/18 1953 163 lb (73.9 kg)     Height 10/13/18 1953 5\' 8"  (1.727 m)     Head Circumference --      Peak Flow --      Pain Score 10/13/18 1957 2     Pain Loc --      Pain Edu? --      Excl. in [place]? --     Constitutional: Alert and oriented. Well appearing and in no apparent distress. HEENT:      Head: Normocephalic and atraumatic.         Eyes: Conjunctivae are normal. Sclera is non-icteric.       Mouth/Throat: Mucous membranes are moist.       Neck: Supple with no signs of meningismus. Cardiovascular: Regular rate and rhythm. No murmurs, gallops, or rubs. 2+ symmetrical distal pulses are present in all extremities. No JVD. Respiratory: Normal respiratory effort. Lungs are clear to auscultation bilaterally. No wheezes, crackles, or rhonchi.  Gastrointestinal: Soft, non tender, and non distended with positive bowel sounds. No rebound or guarding. Genitourinary: No CVA tenderness. Musculoskeletal: Nontender with normal range of motion in all extremities. No edema, cyanosis, or erythema of extremities. Neurologic: Normal speech and language. Face is symmetric. Moving all extremities. No gross focal neurologic deficits are appreciated. Skin: Skin is warm, dry and intact. No rash noted. Psychiatric: Mood and affect are normal. Speech and behavior are normal.  ____________________________________________   LABS (all labs ordered are listed, but only abnormal results are displayed)  Labs Reviewed  URINALYSIS, COMPLETE (UACMP) WITH MICROSCOPIC - Abnormal; Notable for the following components:      Result Value   Color, Urine YELLOW (*)    APPearance CLEAR (*)     Glucose, UA >=500 (*)    Protein, ur 100 (*)    All other components within normal limits  BASIC METABOLIC PANEL - Abnormal; Notable for the following components:   Glucose, Bld 183 (*)    Creatinine, Ser 4.35 (*)    Calcium 8.8 (*)    GFR calc non Af Amer 12 (*)  GFR calc Af Amer 14 (*)    All other components within normal limits  CBC - Abnormal; Notable for the following components:   RBC 3.49 (*)    Hemoglobin 10.7 (*)    HCT 33.1 (*)    Platelets 120 (*)    All other components within normal limits   ____________________________________________  EKG  ED ECG REPORT I, Rudene Re, the attending physician, personally viewed and interpreted this ECG.  Normal sinus rhythm, rate of 64, first-degree AV block, normal QTC, normal axis, ST depressions in inferior lateral leads which is unchanged from prior.  No ST elevations. ____________________________________________  RADIOLOGY  I have personally reviewed the images performed during this visit and I agree with the Radiologist's read.   Interpretation by Radiologist:  Ct Renal Stone Study  Result Date: 10/14/2018 CLINICAL DATA:  Right lower back pain, tenderness to palpation in the right flank. EXAM: CT ABDOMEN AND PELVIS WITHOUT CONTRAST TECHNIQUE: Multidetector CT imaging of the abdomen and pelvis was performed following the standard protocol without IV contrast. COMPARISON:  CT 09/02/2018 FINDINGS: Lower chest: Basilar areas of atelectasis and/or scarring. Lung bases are otherwise unremarkable. Atherosclerotic calcification of the coronary arteries. Dense calcification of the mitral annulus is noted as well. Hepatobiliary: No focal liver abnormality is seen. No gallstones, gallbladder wall thickening, or biliary dilatation. Pancreas: Unremarkable. No pancreatic ductal dilatation or surrounding inflammatory changes. Spleen: Normal in size without focal abnormality. Adrenals/Urinary Tract: Stable appearance of a 2.2 cm  intermediate attenuation (23 HU) left adrenal nodule. Normal right adrenal gland. Mild bilateral nonspecific perinephric stranding, a nonspecific finding though may correlate with either age or decreased renal function. Combination of atherosclerotic vascular calcifications and few punctate collecting system calcifications. No obstructive urolithiasis. No concerning renal lesions. Ureters are normal course and caliber. Urinary bladder is largely decompressed at the time of exam but with wall thickening greater than expected for the degree of underdistention Stomach/Bowel: Small sliding-type hiatal hernia. Stomach is unremarkable. Redemonstration of 2 large air and fluid-filled duodenal diverticulum best appreciated on coronal imaging (5/33, 5/50) which are not significantly changed from comparison study. Small bowel is free of wall thickening or dilatation. No evidence of obstruction. A normal appendix is visualized. Diffuse colonic diverticulosis is present without focal pericolonic inflammation is suggest acute diverticulitis. Vascular/Lymphatic: Atherosclerotic plaque within the normal caliber aorta. [place] of the renal arteries and IMA are heavily diseased but incompletely characterized on this noncontrast study. No suspicious or enlarged lymph nodes in the included lymphatic chains. Reproductive: Coarse eccentric calcification of the prostate. No concerning abnormalities of the prostate or seminal vesicles. Other: No abdominopelvic free fluid or free gas. No bowel containing hernias. Musculoskeletal: Focal soft tissue stranding and infiltration overlying the right greater trochanter, slightly increased from prior exam. Multilevel degenerative changes are present in the imaged portions of the spine. No acute osseous abnormality or suspicious osseous lesion. IMPRESSION: 1. Urinary bladder is largely decompressed at the time of exam but with wall thickening greater than expected for the degree of underdistention.  Correlate with urinalysis to exclude cystitis. 2. Bilateral nonobstructing nephrolithiasis as well as extensive renal artery calcification. 3. Focal soft tissue stranding and infiltration overlying the right greater trochanter, slightly increased from prior exam, recommend direct visual assessment for skin breakdown or cellulitis. 4. Large duodenal diverticula, similar to prior exams. 5. Pancolonic diverticulosis without evidence of acute diverticulitis 6. Small hiatal hernia 7. Redemonstration of the indeterminate 2.2 cm lesion in the left adrenal gland. Not significantly changed from prior  study though further evaluation with dedicated adrenal protocol CT in July of 2021 to document 1 year stability is recommended. This recommendation follows ACR consensus guidelines: Management of Incidental Adrenal Masses: A White Paper of the ACR Incidental Findings Committee. J Am Coll Radiol 2017;14:1038-1044. 8. Aortic Atherosclerosis (ICD10-I70.0). Electronically Signed   By: Lovena Le M.D.   On: 10/14/2018 00:27     ____________________________________________   PROCEDURES  Procedure(s) performed: None Procedures Critical Care performed:  None ____________________________________________   INITIAL IMPRESSION / ASSESSMENT AND PLAN / ED COURSE  83 y.o. male with a history of ESRD on HD, diabetes, anemia, CAD, carotid artery stenosis on Plavix who presents for evaluation of sudden dull right-sided flank pain which has now resolved.  Patient is well-appearing in no distress with normal vitals, no abdominal tenderness, no flank tenderness.  Urinalysis negative for UTI.  CT renal showing several stones in bilateral kidneys but none in the ureter or the bladder.  Creatinine stable, normal potassium, no anion gap, no signs of DKA. Stable mild anemia. EKG showing ST depressions however those are seen on EKG for last year. Most likely cause of patient's pain at this time is a passed ureteral stone. Patient is  pain free. Low suspicion for dissection with resolution of the pain and intact strong peripheral pulses, warm and well perfused limb.     _________________________ 12:56 AM on 10/14/2018 -----------------------------------------  CT consistent with several bilateral nonobstructing nephrolithiasis with no evidence of ureteral lithiasis at this time.  Patient remains pain-free.  CT also raised the concern of possible focal soft tissue stranding and infiltration on the right greater trochanter area consistent with possible skin breakdown or cellulitis.  Patient was examined and fully undressed and the area looks normal with no erythema, no crepitus, no bullae, no skin openings, no tenderness. Patient has full painful ROM of the R hip with no swelling or erythema. I discussed close follow-up with primary care doctor or return to the emergency room if the pain recurs or if patient has chest pain, abdominal pain.  Patient is in agreement with plan and feels comfortable going home.    As part of my medical decision making, I reviewed the following data within the [place] notes reviewed and incorporated, Labs reviewed , EKG interpreted , Old EKG reviewed, Old chart reviewed, Radiograph reviewed , Notes from prior ED visits and [place] Controlled Substance Database   Patient was evaluated in Emergency Department today for the symptoms described in the history of present illness. Patient was evaluated in the context of the global COVID-19 pandemic, which necessitated consideration that the patient might be at risk for infection with the SARS-CoV-2 virus that causes COVID-19. Institutional protocols and algorithms that pertain to the evaluation of patients at risk for COVID-19 are in a state of rapid change based on information released by regulatory bodies including the CDC and federal and state organizations. These policies and algorithms were followed during the patient's care in the ED.    ____________________________________________   FINAL CLINICAL IMPRESSION(S) / ED DIAGNOSES   Final diagnoses:  Kidney stone      NEW MEDICATIONS STARTED DURING THIS VISIT:  ED Discharge Orders    None       Note:  This document was prepared using Dragon voice recognition software and may include unintentional dictation errors.    Alfred Levins, [place], MD 10/14/18 [phone]

## 2018-10-16 ENCOUNTER — Ambulatory Visit: Payer: Medicare Other | Admitting: Cardiology

## 2018-10-19 ENCOUNTER — Ambulatory Visit (INDEPENDENT_AMBULATORY_CARE_PROVIDER_SITE_OTHER): Payer: Medicare Other | Admitting: *Deleted

## 2018-10-19 DIAGNOSIS — I639 Cerebral infarction, unspecified: Secondary | ICD-10-CM

## 2018-10-19 DIAGNOSIS — I6389 Other cerebral infarction: Secondary | ICD-10-CM

## 2018-10-20 LAB — CUP PACEART REMOTE DEVICE CHECK
Date Time Interrogation Session: 20200824181156
Implantable Pulse Generator Implant Date: 20170321

## 2018-10-20 IMAGING — CR DG CHEST 2V
2 series · 2 of 2 positions shown · non-contrast
Comparison: Chest radiograph performed 05/13/2015

CLINICAL DATA: Acute onset of shortness of breath and dysarthria.
Initial encounter.

EXAM:
CHEST  2 VIEW

[chest pa]
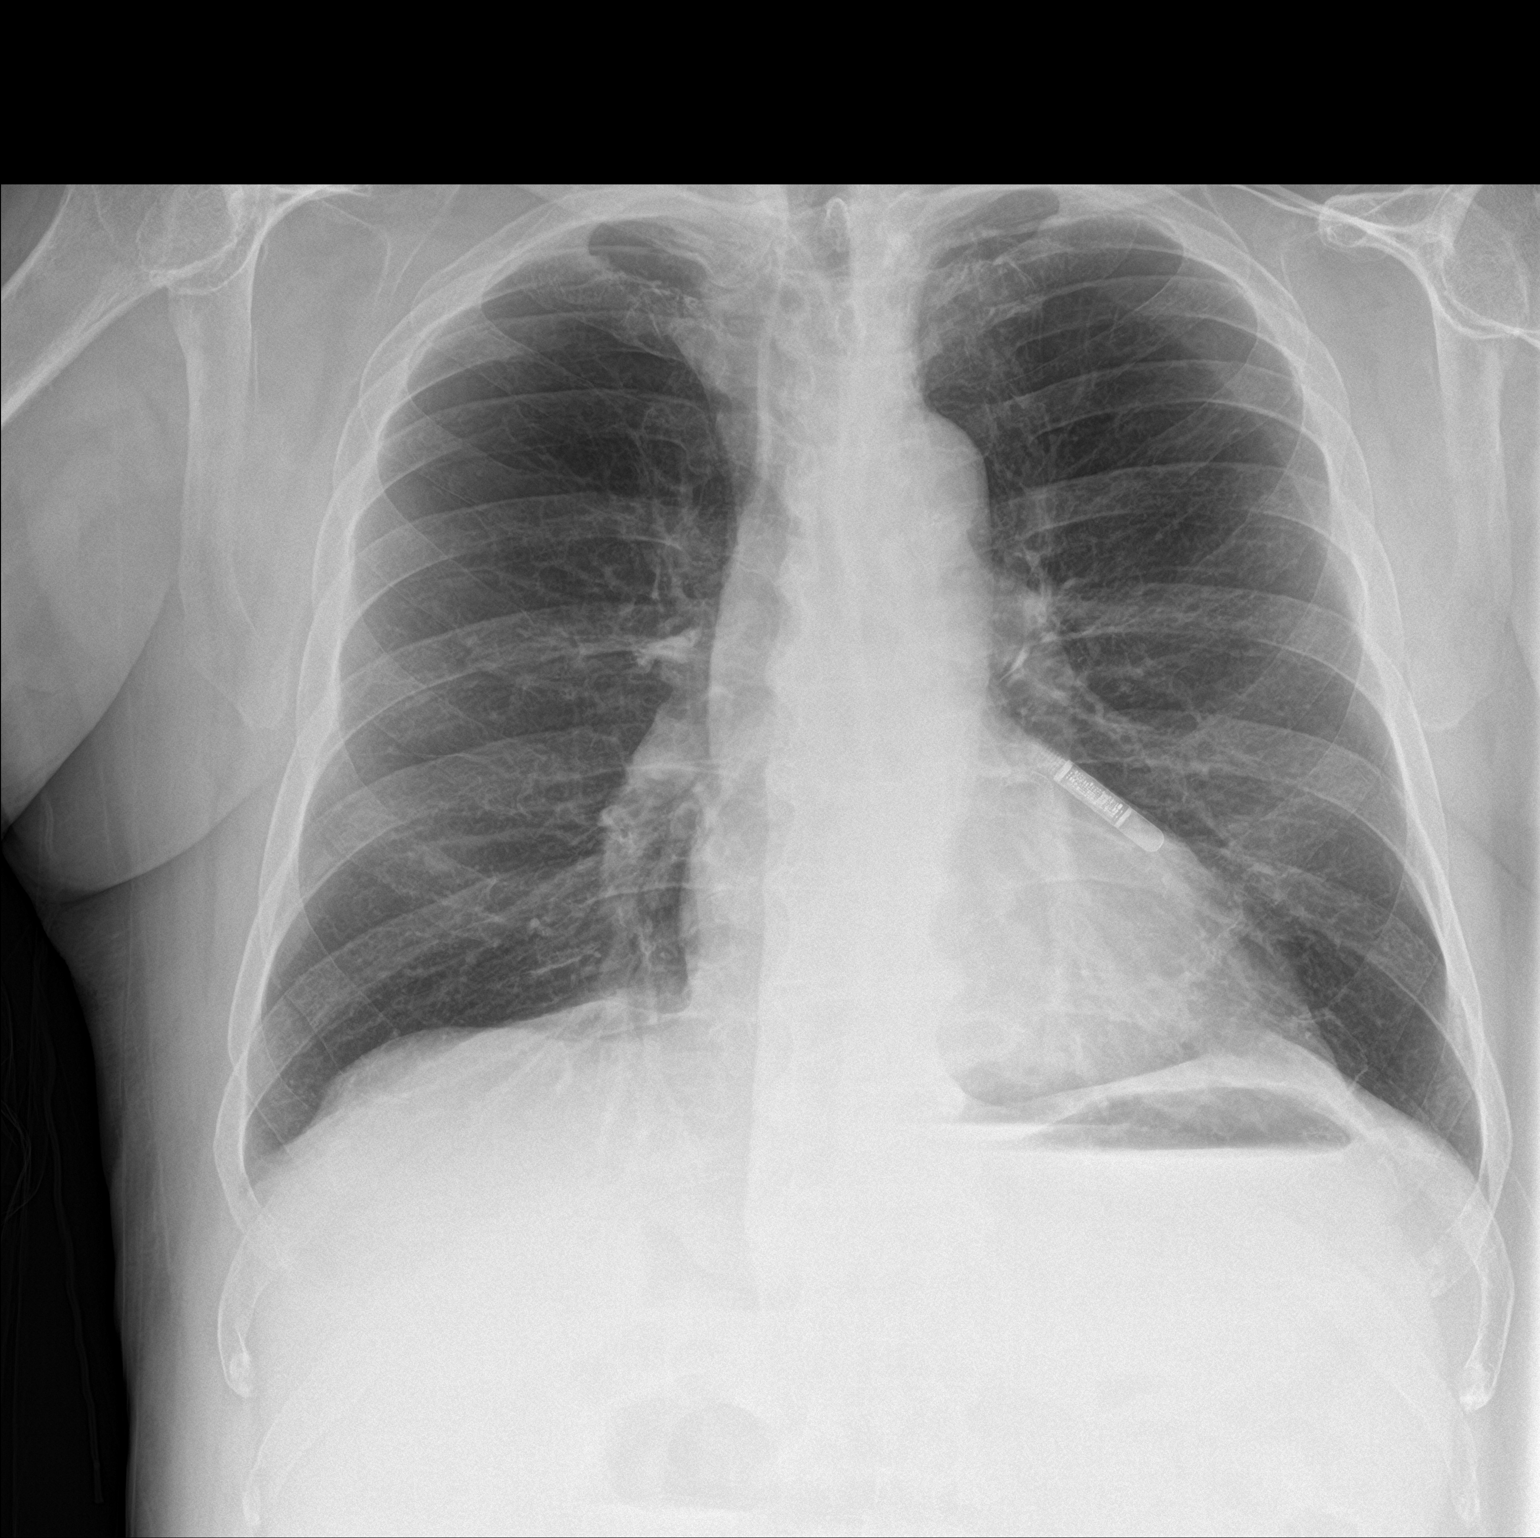

[chest lat]
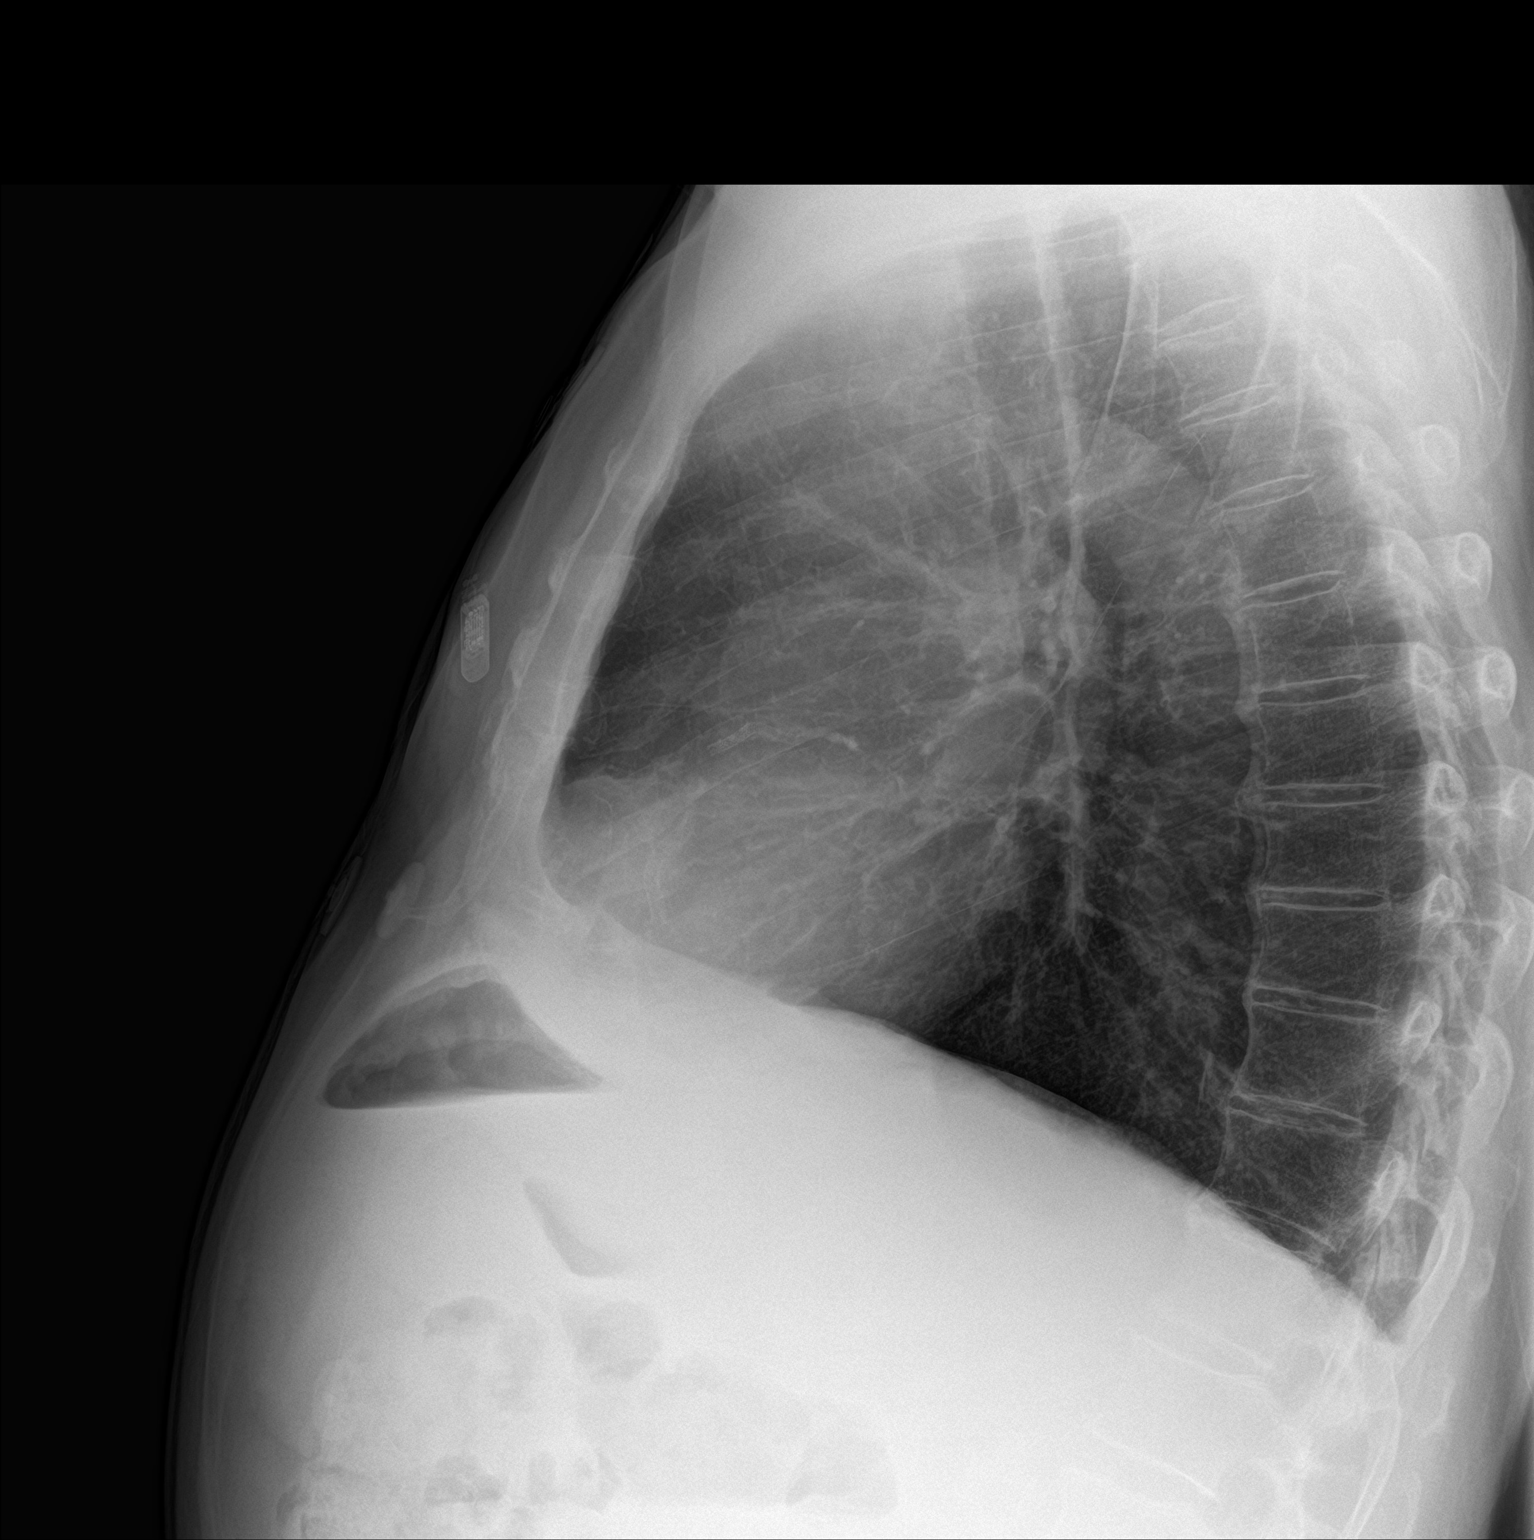

[2 of 2 positions shown; findings below may reference images not displayed]

FINDINGS: The lungs are well-aerated and clear. There is no evidence of focal
opacification, pleural effusion or pneumothorax.

The heart is normal in size; the mediastinal contour is within
normal limits. A loop recorder is noted. No acute osseous
abnormalities are seen.
IMPRESSION: No acute cardiopulmonary process seen.

## 2018-10-20 IMAGING — CT CT NECK W/O CM
4 series · 15 of 33 positions shown, 18 images · non-contrast
Comparison: MRA neck May 14, 2015

CLINICAL DATA: Stroke, assess for carotid plaque. History of RIGHT
carotid stenosis. History of stage 4 chronic kidney disease.

EXAM:
CT NECK WITHOUT CONTRAST
TECHNIQUE: Multidetector CT imaging of the neck was performed following the
standard protocol without intravenous contrast.

[Series 3: neck 2.0 i31s 3 · axial · 0.52mm/px · z∈[+1113,+1297]mm · 5 of 138 slices shown, 7 images]
[im 23/138  soft-tissue]
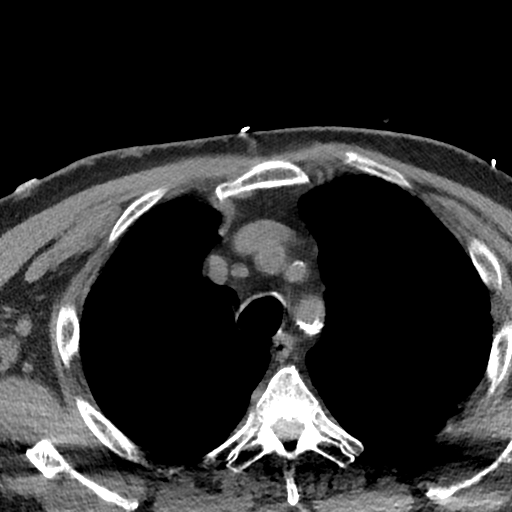
[im 23/138  bone]
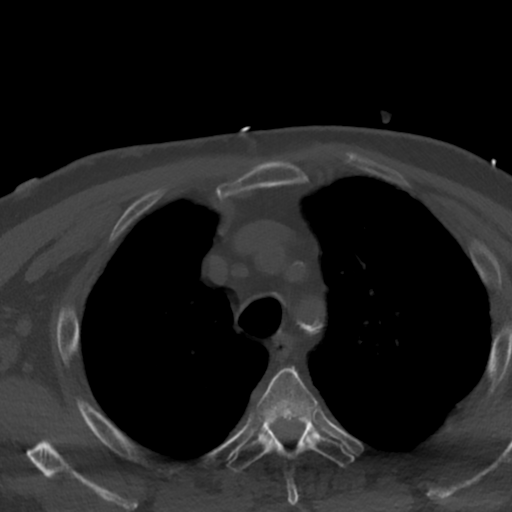
[im 46/138  bone]
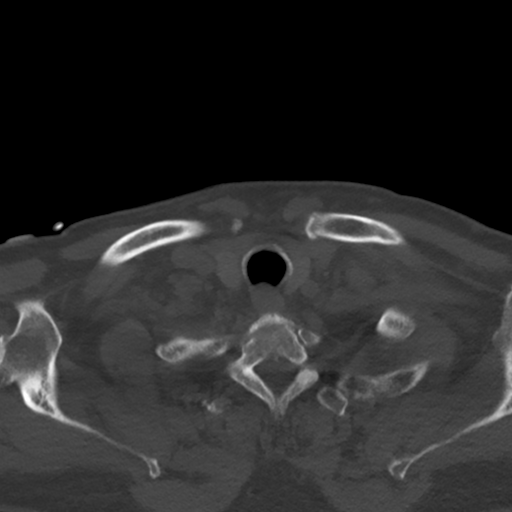
[im 69/138  bone]
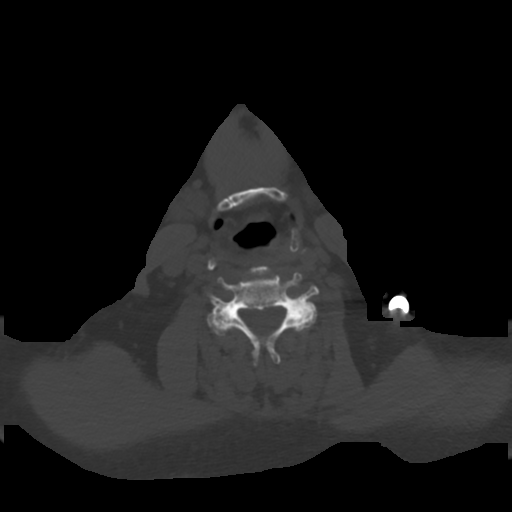
[im 92/138  bone]
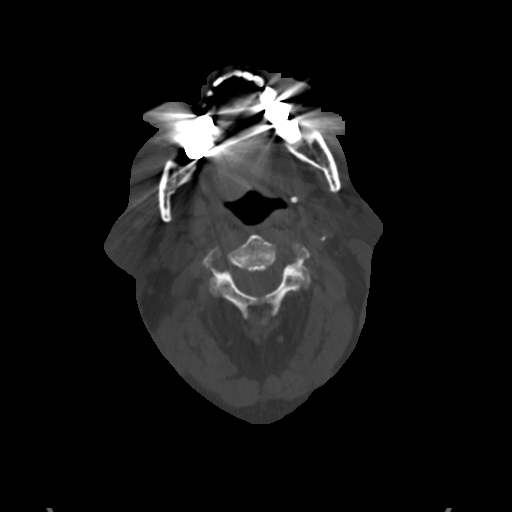
[im 115/138  soft-tissue]
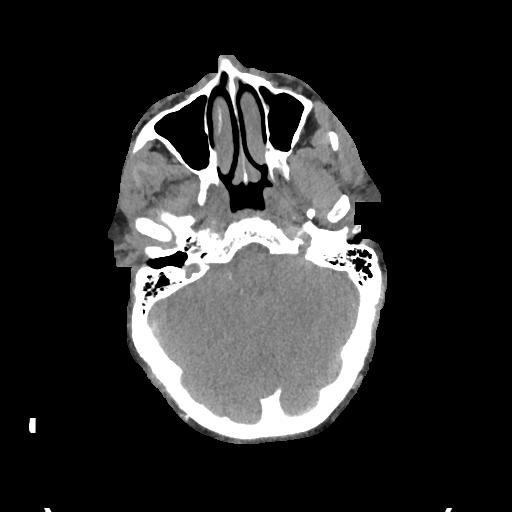
[im 115/138  bone]
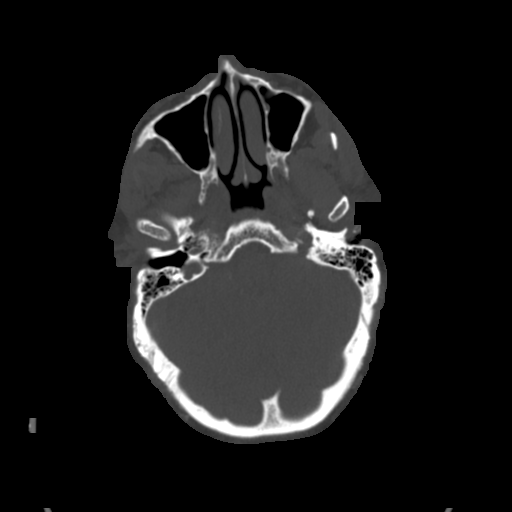

[Series 6: coronal st · coronal · 0.46mm/px · 3 of 122 slices shown]
[im 25/122  bone]
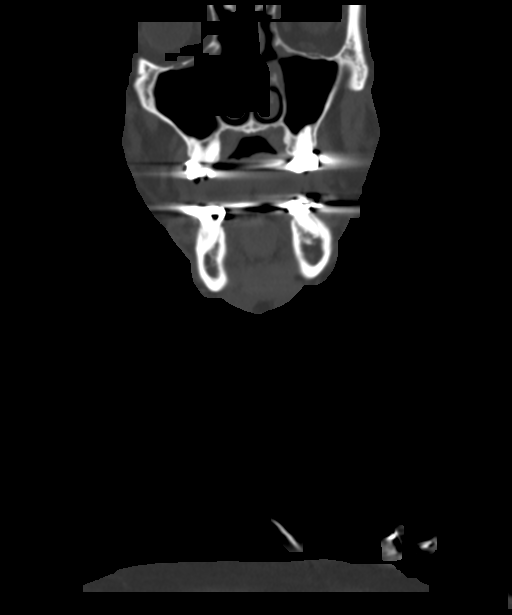
[im 49/122  bone]
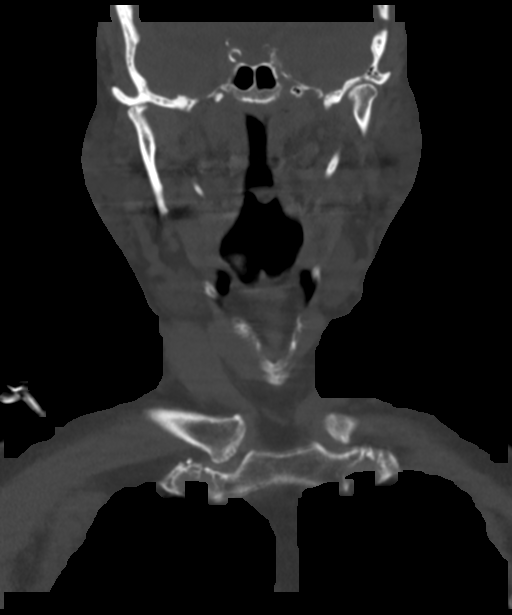
[im 73/122  bone]
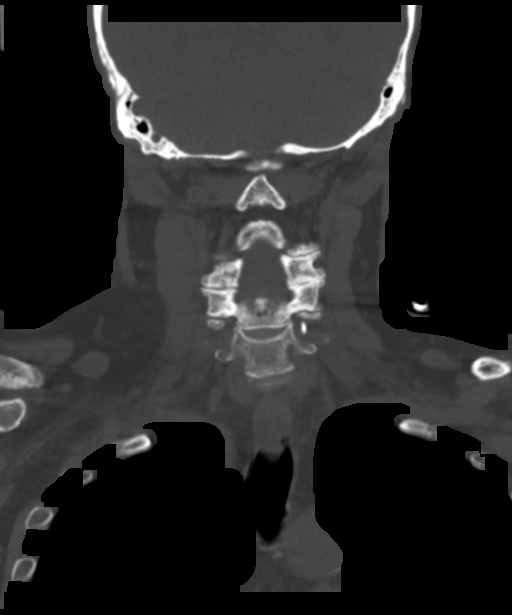

[Series 7: sagittal st · sagittal · 0.54mm/px · 5 of 101 slices shown, 6 images]
[im 34/101  bone]
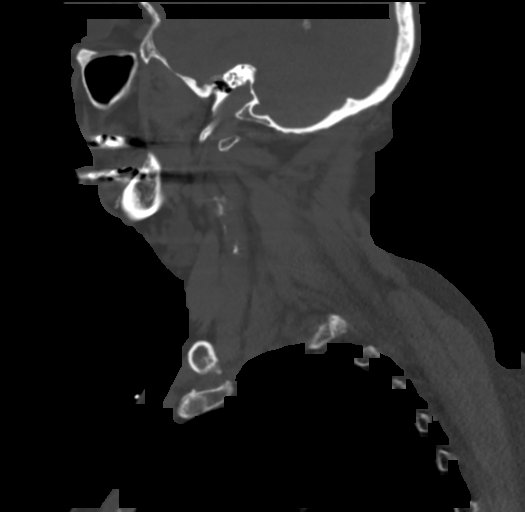
[im 42/101  bone]
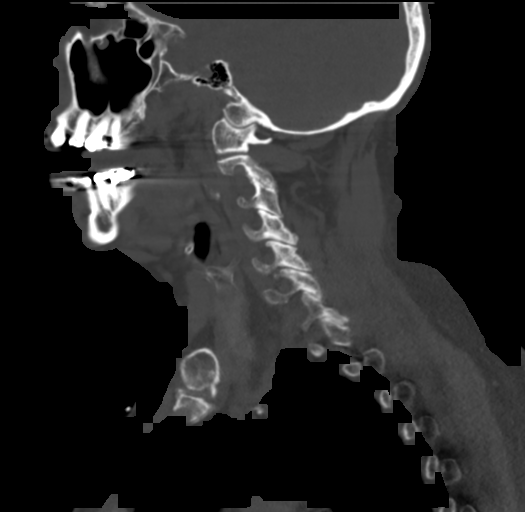
[im 51/101  soft-tissue]
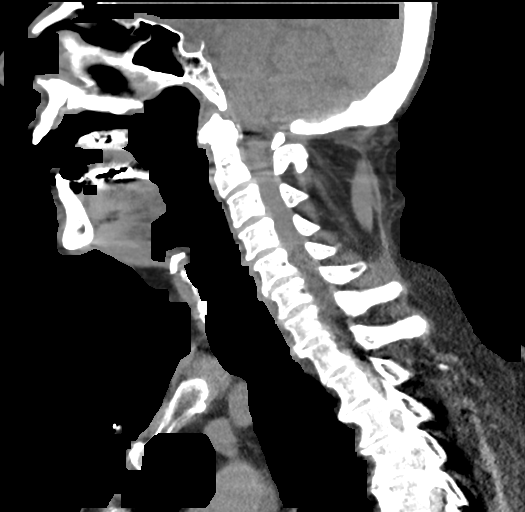
[im 51/101  bone]
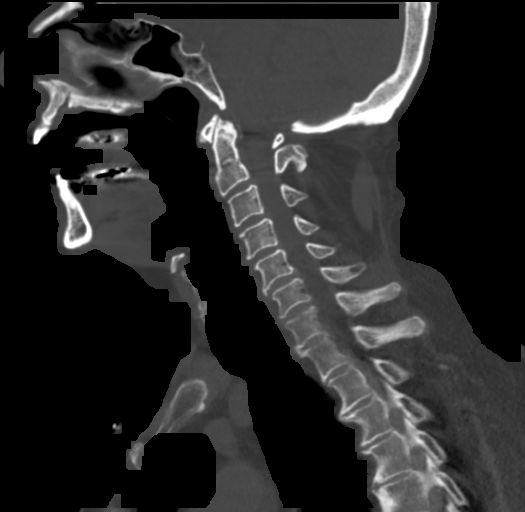
[im 59/101  bone]
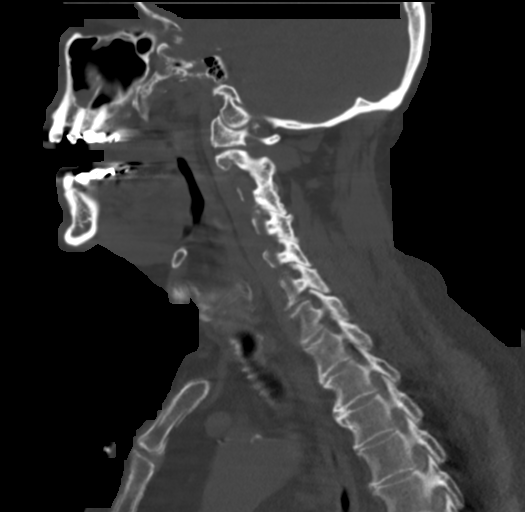
[im 67/101  bone]
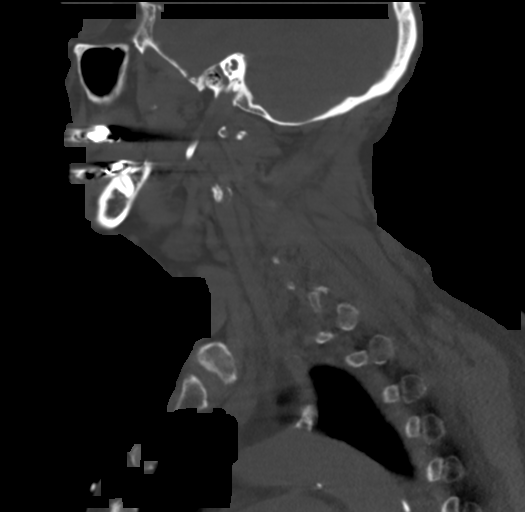

[Series 8: orthogonal st · axial · 0.39mm/px · z∈[+1114,+1160]mm · 2 of 137 slices shown]
[im 23/137  bone]
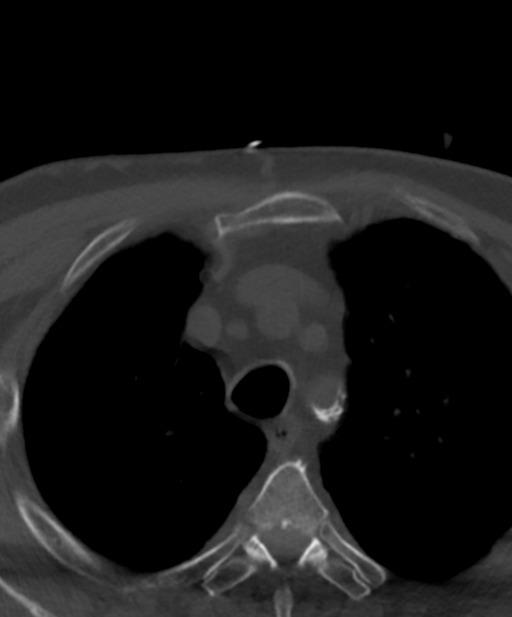
[im 46/137  bone]
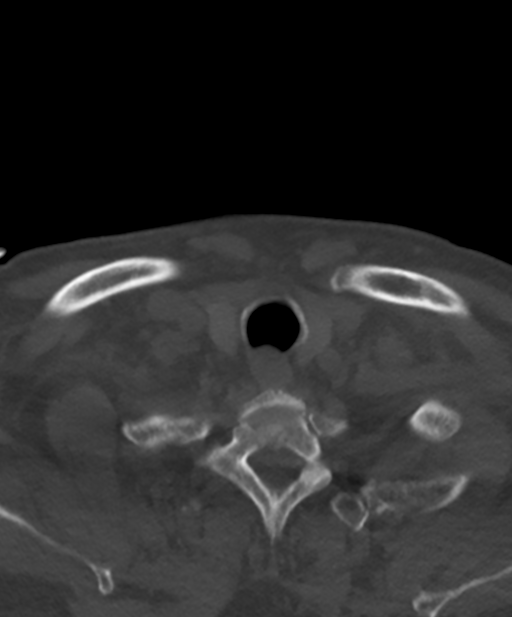

[15 of 33 positions shown; findings below may reference images not displayed]

FINDINGS: Mild motion degraded examination.

PHARYNX AND LARYNX: Normal.  Widely patent airway.

SALIVARY GLANDS: Normal.

THYROID: Normal.

LYMPH NODES: No lymphadenopathy by CT size criteria.

VASCULAR: Calcific atherosclerosis effacing the RIGHT internal
carotid artery origin, and nearly effacing the LEFT internal carotid
artery origin. Focal calcific atherosclerosis of the bilateral mid
V2 segments.

LIMITED INTRACRANIAL: Punctate calcifications RIGHT temporal
occipital lobe.

VISUALIZED ORBITS: Normal.

MASTOIDS AND VISUALIZED PARANASAL SINUSES: Mild paranasal sinus
mucosal thickening, status post FESS. Mastoid air cells are well
aerated, pneumatized petrous apices.

SKELETON: Nonacute. Multilevel moderate facet arthropathy.
Congenital canal narrowing on the basis of foreshortened pedicles.

UPPER CHEST: Lung apices are clear, centrilobular emphysema
incompletely characterized. 11 mm short access LEFT peritracheal
lymph node, with additional subcentimeter pretracheal lymph node.
Lymphadenopathy.

OTHER: Calcified bilateral stylohyoid ligament.
IMPRESSION: Severe calcific atherosclerosis of the internal carotid artery
origins and suspected severe RIGHT greater than LEFT stenosis which
would be better characterized on CTA neck if renal function
improves.

Calcific atherosclerosis and suspected stenosis mid intraforaminal
vertebral artery's.

## 2018-10-26 ENCOUNTER — Telehealth (INDEPENDENT_AMBULATORY_CARE_PROVIDER_SITE_OTHER): Payer: Medicare Other | Admitting: Adult Health

## 2018-10-26 ENCOUNTER — Encounter: Payer: Self-pay | Admitting: Adult Health

## 2018-10-26 DIAGNOSIS — Z794 Long term (current) use of insulin: Secondary | ICD-10-CM

## 2018-10-26 DIAGNOSIS — I63411 Cerebral infarction due to embolism of right middle cerebral artery: Secondary | ICD-10-CM | POA: Diagnosis not present

## 2018-10-26 DIAGNOSIS — E119 Type 2 diabetes mellitus without complications: Secondary | ICD-10-CM

## 2018-10-26 DIAGNOSIS — Z95818 Presence of other cardiac implants and grafts: Secondary | ICD-10-CM

## 2018-10-26 DIAGNOSIS — I10 Essential (primary) hypertension: Secondary | ICD-10-CM | POA: Diagnosis not present

## 2018-10-26 DIAGNOSIS — E785 Hyperlipidemia, unspecified: Secondary | ICD-10-CM

## 2018-10-26 NOTE — Progress Notes (Signed)
[facility] [address] [place]. [place] [address] [phone]       FOLLOW-UP NOTE  [name]. Date of Birth:  03/23/1935 Medical Record Number:  [id number]   Reason for visit: Follow-up regarding right MCA stroke 04/2016  Virtual Visit via Video Note  I connected with [name] on 10/26/18 at 11:15 AM EDT by a video enabled telemedicine application located at [facility] and verified that I am speaking with the correct person using two identifiers who was located at their own home.   Tresa Endo (administrative assistant) scheduled visit to discussed the limitations of evaluation and management by telemedicine and the availability of in person appointments. The patient expressed understanding and agreed to proceed.    HPI:   Robert [name] is an 83 y.o. male  with underlying medical history of HTN, HLD, DM, CAD, CHF and ESRD on HD and right MCA embolic infarct with loop recorder placement in 04/2016 and right ICA stenosis s/p right carotid endarterectomy after presenting with slurred speech.  During hospitalization, neurology consulted with stroke work-up revealing right MCA punctate infarcts embolic pattern.  He was also found to have right ICA 60 to 79% stenosis with undergoing elective right carotid enterectomy by Dr. Trula Slade without complication.  Loop recorder placed to rule out atrial fibrillation as potential cause of stroke.  Initiated DAPT.  Discharged to [place] home for ongoing deficits with residual dysarthria, left facial weakness and left hand weakness.  He had AV fistula placed in 06/2016 for HD treatments with ESRD.  Repeat carotid ultrasound 03/2017 showed 60 to 79% right carotid restenosis at surgical site and 40 to 59% left ICA stenosis.  Repeat imaging 6 months later continue to show restenosis but most recent carotid duplex in 07/2018 showed improvement of right ICA stenosis at 40 to 59% and continued  left ICA 40 to 59% stenosis.   Update via virtual visit 10/26/2018: [name] is a 83 year old male who is being seen today for 1 year stroke follow-up via telemedicine via MyChart due to COVID-19 safety precautions.  He is accompanied for today's virtual visit by Elmyra Ricks, his caregiver.  She assists him with medication administration, checking vitals, cooking and cleaning but he is able to maintain all ADLs independently.  Residual stroke deficit of mild left hand decreased dexterity but overall stable without worsening or new stroke/TIA symptoms.  Loop recorder is not shown atrial fibrillation thus far.  He continues on aspirin 81 mg and Plavix without bleeding or bruising.  Recent lipid panel on 10/05/2018 obtained by cardiology with elevated triglycerides at 577 with inability to calculate LDL.  It was recommended to initiate Lovaza in addition to Zetia and simvastatin which she is tolerating well without side effects.  He continues to follow with Dr. Trula Slade with prior visit on 08/10/2018 with a repeat carotid ultrasound showing improvement of right ICA stenosis at 40 to 59% (previously 60 to 79%) and left ICA 40 to 59% stenosis and recommended repeating in 1 year. Blood pressure monitored at HD 3 days weekly which has been stable. Glucose levels have been improving with improvement of compliance of insulin regimen.  No further concerns at this time.     PMH:  Past Medical History:  Diagnosis Date   Allergy    year round   Anemia    Cancer ([place])    PSA ELEVATED   Carotid artery occlusion    Cataract    Depression  Diabetes mellitus    Dyspnea    ESRD (end stage renal disease) ([place])    [place]   GERD (gastroesophageal reflux disease)    Hearing deficit    History of kidney stones    Hyperlipidemia    Hypertension    Hypothyroidism    Myocardial infarction [facility]) 2011   Stroke (cerebrum) (HCC)    weakness in left hand  04/2015, 05/20/16   Thyroid disease     hypothyroid    Social History:  Social History   Socioeconomic History   Marital status: Married    Spouse name: Not on file   Number of children: Not on file   Years of education: Not on file   Highest education level: Not on file  Occupational History   Not on file  Social Needs   Financial resource strain: Not on file   Food insecurity    Worry: Not on file    Inability: Not on file   Transportation needs    Medical: Not on file    Non-medical: Not on file  Tobacco Use   Smoking status: Former Smoker    Types: Cigarettes   Smokeless tobacco: Never Used   Tobacco comment: quit age 89  Substance and Sexual Activity   Alcohol use: Yes    Alcohol/week: 2.0 standard drinks    Types: 2 Glasses of wine per week    Comment: rarely   Drug use: No   Sexual activity: Not on file  Lifestyle   Physical activity    Days per week: Not on file    Minutes per session: Not on file   Stress: Not on file  Relationships   Social connections    Talks on phone: Not on file    Gets together: Not on file    Attends religious service: Not on file    Active member of club or organization: Not on file    Attends meetings of clubs or organizations: Not on file    Relationship status: Not on file   Intimate partner violence    Fear of current or ex partner: Not on file    Emotionally abused: Not on file    Physically abused: Not on file    Forced sexual activity: Not on file  Other Topics Concern   Not on file  Social History Narrative   Not on file    Medications:   Current Outpatient Medications on File Prior to Visit  Medication Sig Dispense Refill   ACCU-CHEK AVIVA PLUS test strip      aspirin 81 MG tablet Take 1 tablet (81 mg total) by mouth daily. 30 tablet 0   calcium acetate (PHOSLO) 667 MG tablet Take 1,334 mg by mouth See admin instructions. Take 2 tablets (1334 mg) by mouth daily with any meals or snacks.  5   clopidogrel (PLAVIX) 75 MG  tablet Take 1 tablet (75 mg total) by mouth daily. 30 tablet 1   ezetimibe (ZETIA) 10 MG tablet Take 1 tablet (10 mg total) by mouth daily. 30 tablet 1   fexofenadine (ALLEGRA) 180 MG tablet Take 180 mg by mouth daily.     folic acid-vitamin b complex-vitamin c-selenium-zinc (DIALYVITE) 3 MG TABS tablet Take 1 tablet by mouth daily.     hydrALAZINE (APRESOLINE) 50 MG tablet Take 50 mg by mouth See admin instructions. Sunday, Monday, Wednesday & Friday patient takes 1 tablet (50 mg) by mouth twice daily. Tuesday, Thursday, & Saturdays take 1 tablet  at bedtime     insulin degludec (TRESIBA FLEXTOUCH) 100 UNIT/ML SOPN FlexTouch Pen Inject 25 Units into the skin at bedtime.      levothyroxine (SYNTHROID, LEVOTHROID) 50 MCG tablet Take 50 mcg by mouth daily before breakfast.      metoprolol succinate (TOPROL-XL) 50 MG 24 hr tablet Take 50 mg by mouth. TAKE 1 TABLET BY MOUTH IN  THE EVENING TUESDAY,  THURSDAY, SATURDAY AND 1  TABLET TWICE DAILY ALL  OTHER DAYS     pantoprazole (PROTONIX) 40 MG tablet Take 40 mg by mouth every evening.      sertraline (ZOLOFT) 20 MG/ML concentrated solution Take 20 mg by mouth daily.      simvastatin (ZOCOR) 40 MG tablet Take 1 tablet (40 mg total) by mouth daily at 6 PM. 30 tablet 0   No current facility-administered medications on file prior to visit.     Allergies:   Allergies  Allergen Reactions   Lisinopril Other (See Comments)    "made me lose my voice"    Physical Exam  General: well developed, well nourished, pleasant elderly Caucasian male, seated, in no evident distress Head: head normocephalic and atraumatic.    Neurologic Exam Mental Status: Awake and fully alert. Oriented to place and time. Recent and remote memory intact. Attention span, concentration and fund of knowledge appropriate. Mood and affect appropriate.  Cranial Nerves: Extraocular movements full without nystagmus. Hearing intact to voice. Facial sensation intact. Face,  tongue, palate moves normally and symmetrically.  Shoulder shrug symmetric. Motor: No evidence of large muscle weakness per drift assessment Sensory.: intact to light touch Coordination: Rapid alternating movements normal in all extremities except decreased finger tapping of left hand. Finger-to-nose and heel-to-shin performed accurately bilaterally.  Orbits right arm over left arm. Gait and Station: Arises from chair without difficulty. Stance is normal. Gait demonstrates normal stride length and balance .  Reflexes: UTA      ASSESSMENT: 70 year Caucasian male with a right MCA branch embolic infarcts from proximal right carotid stenosis status post carotid endarterectomy electively in 04/2016. He has residual left hand weakness with decreased dexterity but overall stable from a stroke standpoint. Vascular risk factors of diabetes, hypertension, hyperlipidemia, CHF, ESRD on HD and CAD.  Loop recorder is not shown atrial fibrillation thus far.    PLAN: -Continue aspirin 81 mg and clopidogrel 75 mg daily for secondary stroke prevention -Continue simvastatin, Zetia and Lovaza for HLD management and ongoing follow-up with cardiology -Continue to follow with Dr. Trula Slade for ongoing surveillance monitoring of carotid stenosis -recent carotid Doppler showed improvement of right ICA stenosis and recommended follow-up in 1 year -Continue to monitor loop recorder for atrial fibrillation by device clinic -Continue to follow with nephrology for ESRD on HD -maintain strict control of hypertension with blood pressure goal below 130/90, lipids with LDL cholesterol goal below 70 mg percent and diabetes with him about an A1c goal below 6.5%. Return for follow-up with Janett Billow my nurse practitioner in one year or call earlier if necessary  Overall stable from a stroke standpoint with routine follow-up by cardiology, PCP and vascular surgery therefore recommend follow-up in our office as needed   Greater than  50% of time during this 25 minute non-face-to-face visit was spent on discussion of residual deficits, recent imaging and lab results, ongoing follow-up with PCP, cardiology, vascular surgery and nephrology, planning of further management, and answered all questions to patient satisfaction  Frann Rider Wynn Maudlin), AGNP-BC  [facility] [phone]  [place], [place] [address]  Phone [phone] Fax [phone] Note: This document was prepared with digital dictation and possible smart phrase technology. Any transcriptional errors that result from this process are unintentional.

## 2018-10-26 NOTE — Progress Notes (Signed)
I agree with the above plan 

## 2018-10-26 NOTE — Progress Notes (Signed)
Carelink Summary Report / Loop Recorder 

## 2018-10-28 ENCOUNTER — Telehealth: Payer: Self-pay | Admitting: *Deleted

## 2018-10-28 NOTE — Telephone Encounter (Signed)
Spoke with patient's daughter [facility]) as unable to reach patient. LINQ at RRT as of 10/24/18. Presented options at this point, pt's daughter will discuss with pt and have him call us back. Direct DC number given.

## 2018-10-29 NOTE — Telephone Encounter (Signed)
The pt wants to talk with Dr. Rayann Heman about having his loop recorder removed. He asked me if that can be a conversation over the phone? I told him he might be able to talk with Dr. Rayann Heman over the phone about having his device removed. I did tell him if that was the option he will have to come into the office to have the loop removed. I told him I was going to send Dr. Rayann Heman scheduler the phone note to schedule the appointment and give him a call back. The best number to reach the pt is [phone].

## 2018-11-03 ENCOUNTER — Telehealth: Payer: Self-pay

## 2018-11-03 NOTE — Telephone Encounter (Signed)
Spoke with pt about his upcoming appt on 11/04/18. Pt stated he will check his vitals prior to appt. Pt questions and concerns were address.

## 2018-11-04 ENCOUNTER — Telehealth (INDEPENDENT_AMBULATORY_CARE_PROVIDER_SITE_OTHER): Payer: Medicare Other | Admitting: Internal Medicine

## 2018-11-04 ENCOUNTER — Encounter: Payer: Self-pay | Admitting: Internal Medicine

## 2018-11-04 ENCOUNTER — Other Ambulatory Visit: Payer: Self-pay

## 2018-11-04 VITALS — BP 139/75 | HR 76 | Ht 68.0 in | Wt 165.0 lb

## 2018-11-04 DIAGNOSIS — I639 Cerebral infarction, unspecified: Secondary | ICD-10-CM

## 2018-11-04 DIAGNOSIS — I25118 Atherosclerotic heart disease of native coronary artery with other forms of angina pectoris: Secondary | ICD-10-CM | POA: Diagnosis not present

## 2018-11-04 DIAGNOSIS — I110 Hypertensive heart disease with heart failure: Secondary | ICD-10-CM

## 2018-11-04 NOTE — Progress Notes (Signed)
Electrophysiology TeleHealth Note   Due to national recommendations of social distancing due to [place] 19, an audio telehealth visit is felt to be most appropriate for this patient at this time.  Verbal consent was obtained by me for the telehealth visit today.  The patient does not have capability for a virtual visit.  A phone visit is therefore required today.   Date:  11/04/2018   ID:  [name], DOB [birth year], MRN [id number]  Location: patient's home  Provider location:  [facility]  Evaluation Performed: Follow-up visit  PCP:  Burnard Bunting, MD   Electrophysiologist:  Dr Rayann Heman  Chief Complaint:  Cruz Condon at RRT  History of Present Illness:    Mitsuru Dault. is a 83 y.o. male who presents via telehealth conferencing today.  Since last being seen in our clinic, the patient reports doing relatively well.  Today, he denies symptoms of palpitations, chest pain, shortness of breath,  lower extremity edema, dizziness, presyncope, or syncope.  The patient is otherwise without complaint today.  The patient denies symptoms of fevers, chills, cough, or new SOB worrisome for COVID 19.  Past Medical History:  Diagnosis Date  . Allergy    year round  . Anemia   . Cancer (HCC)    PSA ELEVATED  . Carotid artery occlusion   . Cataract   . Depression   . Diabetes mellitus   . Dyspnea   . ESRD (end stage renal disease) [facility])    [place]  . GERD (gastroesophageal reflux disease)   . Hearing deficit   . History of kidney stones   . Hyperlipidemia   . Hypertension   . Hypothyroidism   . Myocardial infarction ([place]) 2011  . Stroke (cerebrum) (HCC)    weakness in left hand  04/2015, 05/20/16  . Thyroid disease    hypothyroid    Past Surgical History:  Procedure Laterality Date  . A/V FISTULAGRAM Right 06/24/2017   Procedure: A/V FISTULAGRAM;  Surgeon: Serafina Mitchell, MD;  Location: [place] [facility];  Service: Cardiovascular;  Laterality: Right;  rt   lower arm  . AV FISTULA PLACEMENT Right 07/25/2016   Procedure: ARTERIOVENOUS (AV) FISTULA CREATION-RIGHT;  Surgeon: Serafina Mitchell, MD;  Location: [place];  Service: Vascular;  Laterality: Right;  . BASCILIC VEIN TRANSPOSITION Left 10/15/2017   Procedure: FIRST STAGE BASILIC VEIN TRANSPOSITION LEFT UPPER EXTREMITY;  Surgeon: Serafina Mitchell, MD;  Location: [place];  Service: Vascular;  Laterality: Left;  . BASCILIC VEIN TRANSPOSITION Left 12/10/2017   Procedure: SECOND STAGE BASILIC VEIN TRANSPOSITION LEFT ARM;  Surgeon: Serafina Mitchell, MD;  Location: [facility];  Service: Vascular;  Laterality: Left;  . COLONOSCOPY    . CORONARY ANGIOPLASTY WITH STENT PLACEMENT  07/2009   3 stents  . ELBOW SURGERY     LEFT  . ENDARTERECTOMY Right 06/06/2016   Procedure: RIGHT CAROTID ENDARTERECTOMY WITH PATCH ANGIOPLASTY;  Surgeon: Serafina Mitchell, MD;  Location: [place];  Service: Vascular;  Laterality: Right;  . EP IMPLANTABLE DEVICE N/A 05/16/2015   Procedure: Loop Recorder Insertion;  Surgeon: Thompson Grayer, MD;  Location: [place] [facility];  Service: Cardiovascular;  Laterality: N/A;  . ESOPHAGOSCOPY WITH DILITATION    . FISTULA SUPERFICIALIZATION Right 12/05/2016   Procedure: Superficialization and Ligation of Branches OF RIGHT ARM RADIOCEPHALIC FISTULA;  Surgeon: Serafina Mitchell, MD;  Location: [place];  Service: Vascular;  Laterality: Right;  . NOSE SURGERY    .  PERIPHERAL VASCULAR BALLOON ANGIOPLASTY  06/24/2017   Procedure: PERIPHERAL VASCULAR BALLOON ANGIOPLASTY;  Surgeon: Serafina Mitchell, MD;  Location: [place] [facility];  Service: Cardiovascular;;  rt arm fistula   . stents    . TEE WITHOUT CARDIOVERSION N/A 05/16/2015   Procedure: TRANSESOPHAGEAL ECHOCARDIOGRAM (TEE);  Surgeon: Thayer Headings, MD;  Location: [facility];  Service: Cardiovascular;  Laterality: N/A;    Current Outpatient Medications  Medication Sig Dispense Refill  . ACCU-CHEK AVIVA PLUS test strip     . aspirin 81 MG tablet Take 1  tablet (81 mg total) by mouth daily. 30 tablet 0  . calcium acetate (PHOSLO) 667 MG tablet Take 1,334 mg by mouth See admin instructions. Take 2 tablets (1334 mg) by mouth daily with any meals or snacks.  5  . clopidogrel (PLAVIX) 75 MG tablet Take 1 tablet (75 mg total) by mouth daily. 30 tablet 1  . ezetimibe (ZETIA) 10 MG tablet Take 1 tablet (10 mg total) by mouth daily. 30 tablet 1  . fexofenadine (ALLEGRA) 180 MG tablet Take 180 mg by mouth daily.    . folic acid-vitamin b complex-vitamin c-selenium-zinc (DIALYVITE) 3 MG TABS tablet Take 1 tablet by mouth daily.    . hydrALAZINE (APRESOLINE) 50 MG tablet Take 50 mg by mouth See admin instructions. Sunday, Monday, Wednesday & Friday patient takes 1 tablet (50 mg) by mouth twice daily. Tuesday, Thursday, & Saturdays take 1 tablet at bedtime    . insulin degludec (TRESIBA FLEXTOUCH) 100 UNIT/ML SOPN FlexTouch Pen Inject 25 Units into the skin at bedtime.     [name] levothyroxine (SYNTHROID, LEVOTHROID) 50 MCG tablet Take 50 mcg by mouth daily before breakfast.     . metoprolol succinate (TOPROL-XL) 50 MG 24 hr tablet Take 50 mg by mouth. TAKE 1 TABLET BY MOUTH IN  THE EVENING TUESDAY,  THURSDAY, SATURDAY AND 1  TABLET TWICE DAILY ALL  OTHER DAYS    . omega-3 acid ethyl esters (LOVAZA) 1 g capsule Take 2 capsules by mouth 2 (two) times daily.    . pantoprazole (PROTONIX) 40 MG tablet Take 40 mg by mouth every evening.     . sertraline (ZOLOFT) 20 MG/ML concentrated solution Take 20 mg by mouth daily.     . simvastatin (ZOCOR) 40 MG tablet Take 1 tablet (40 mg total) by mouth daily at 6 PM. 30 tablet 0   No current facility-administered medications for this visit.     Allergies:   Lisinopril   Social History:  The patient  reports that he has quit smoking. His smoking use included cigarettes. He has never used smokeless tobacco. He reports current alcohol use of about 2.0 standard drinks of alcohol per week. He reports that he does not use drugs.    Family History:  The patient's  family history includes Dementia in his maternal grandfather; Diabetes in his mother.   ROS:  Please see the history of present illness.   All other systems are personally reviewed and negative.    Exam:    Vital Signs:  BP 139/75   Pulse 76   Ht '5\' 8"'$  (1.727 m)   Wt 165 lb (74.8 kg)   BMI 25.09 kg/m   Well sounding and appearing, alert and conversant, regular work of breathing   Labs/Other Tests and Data Reviewed:    Recent Labs: 10/05/2018: ALT 6 10/13/2018: BUN 23; Creatinine, Ser 4.35; Hemoglobin 10.7; Platelets 120; Potassium 4.1; Sodium 138   Wt Readings from Last 3  Encounters:  11/04/18 165 lb (74.8 kg)  10/13/18 163 lb (73.9 kg)  10/05/18 164 lb 6.4 oz (74.6 kg)       ASSESSMENT & PLAN:    1.  Cryptogenic stroke No arrhythmias found since implant Device now at RRT Discussed options with patient, he prefers to leave in place for now. He will call if he would like removed in the future. Return kit ordered from [employer].   2.  CAD No ischemic symptoms Continue medical therapy  3.  HTN Stable No change required today    Follow-up:  With EP prn    Patient Risk:  after full review of this patients clinical status, I feel that they are at moderate risk at this time.  Today, I have spent 15 minutes with the patient with telehealth technology discussing arrhythmia management .    SignedThompson Grayer, MD  11/04/2018 1:35 PM     [address] [place] [id number] [phone] (office) [phone] (fax)

## 2018-11-19 ENCOUNTER — Emergency Department (HOSPITAL_COMMUNITY)
Admission: EM | Admit: 2018-11-19 | Discharge: 2018-11-19 | Disposition: A | Discharge status: Home / Self Care | Payer: Medicare Other | Attending: Emergency Medicine | Admitting: Emergency Medicine

## 2018-11-19 ENCOUNTER — Other Ambulatory Visit: Payer: Self-pay

## 2018-11-19 ENCOUNTER — Encounter (HOSPITAL_COMMUNITY): Payer: Self-pay | Admitting: Emergency Medicine

## 2018-11-19 ENCOUNTER — Emergency Department (HOSPITAL_COMMUNITY): Payer: Medicare Other

## 2018-11-19 DIAGNOSIS — Z992 Dependence on renal dialysis: Secondary | ICD-10-CM | POA: Diagnosis not present

## 2018-11-19 DIAGNOSIS — Z87891 Personal history of nicotine dependence: Secondary | ICD-10-CM | POA: Insufficient documentation

## 2018-11-19 DIAGNOSIS — E1122 Type 2 diabetes mellitus with diabetic chronic kidney disease: Secondary | ICD-10-CM | POA: Insufficient documentation

## 2018-11-19 DIAGNOSIS — I120 Hypertensive chronic kidney disease with stage 5 chronic kidney disease or end stage renal disease: Secondary | ICD-10-CM | POA: Insufficient documentation

## 2018-11-19 DIAGNOSIS — E039 Hypothyroidism, unspecified: Secondary | ICD-10-CM | POA: Insufficient documentation

## 2018-11-19 DIAGNOSIS — R531 Weakness: Secondary | ICD-10-CM | POA: Insufficient documentation

## 2018-11-19 DIAGNOSIS — Z20828 Contact with and (suspected) exposure to other viral communicable diseases: Secondary | ICD-10-CM | POA: Diagnosis not present

## 2018-11-19 DIAGNOSIS — Z7982 Long term (current) use of aspirin: Secondary | ICD-10-CM | POA: Diagnosis not present

## 2018-11-19 DIAGNOSIS — Z79899 Other long term (current) drug therapy: Secondary | ICD-10-CM | POA: Diagnosis not present

## 2018-11-19 DIAGNOSIS — N186 End stage renal disease: Secondary | ICD-10-CM | POA: Insufficient documentation

## 2018-11-19 DIAGNOSIS — I259 Chronic ischemic heart disease, unspecified: Secondary | ICD-10-CM | POA: Insufficient documentation

## 2018-11-19 DIAGNOSIS — M25561 Pain in right knee: Secondary | ICD-10-CM

## 2018-11-19 DIAGNOSIS — Z794 Long term (current) use of insulin: Secondary | ICD-10-CM | POA: Insufficient documentation

## 2018-11-19 DIAGNOSIS — R4182 Altered mental status, unspecified: Secondary | ICD-10-CM | POA: Diagnosis present

## 2018-11-19 DIAGNOSIS — Z7902 Long term (current) use of antithrombotics/antiplatelets: Secondary | ICD-10-CM | POA: Insufficient documentation

## 2018-11-19 LAB — COMPREHENSIVE METABOLIC PANEL
ALT: 19 U/L (ref 0–44)
AST: 19 U/L (ref 15–41)
Albumin: 3.4 g/dL — ABNORMAL LOW (ref 3.5–5.0)
Alkaline Phosphatase: 96 U/L (ref 38–126)
Anion gap: 14 (ref 5–15)
BUN: 20 mg/dL (ref 8–23)
CO2: 26 mmol/L (ref 22–32)
Calcium: 8.9 mg/dL (ref 8.9–10.3)
Chloride: 97 mmol/L — ABNORMAL LOW (ref 98–111)
Creatinine, Ser: 4.1 mg/dL — ABNORMAL HIGH (ref 0.61–1.24)
GFR calc Af Amer: 15 mL/min — ABNORMAL LOW (ref >60)
GFR calc non Af Amer: 13 mL/min — ABNORMAL LOW (ref >60)
Glucose, Bld: 148 mg/dL — ABNORMAL HIGH (ref 70–99)
Potassium: 3.8 mmol/L (ref 3.5–5.1)
Sodium: 137 mmol/L (ref 135–145)
Total Bilirubin: 0.5 mg/dL (ref 0.3–1.2)
Total Protein: 6.4 g/dL — ABNORMAL LOW (ref 6.5–8.1)

## 2018-11-19 LAB — CBC
HCT: 35.8 % — ABNORMAL LOW (ref 39.0–52.0)
Hemoglobin: 11.4 g/dL — ABNORMAL LOW (ref 13.0–17.0)
MCH: 31.1 pg (ref 26.0–34.0)
MCHC: 31.8 g/dL (ref 30.0–36.0)
MCV: 97.5 fL (ref 80.0–100.0)
Platelets: 126 10*3/uL — ABNORMAL LOW (ref 150–400)
RBC: 3.67 MIL/uL — ABNORMAL LOW (ref 4.22–5.81)
RDW: 14.6 % (ref 11.5–15.5)
WBC: 8.7 10*3/uL (ref 4.0–10.5)
nRBC: 0 % (ref 0.0–0.2)

## 2018-11-19 LAB — SARS CORONAVIRUS 2 (TAT 6-24 HRS): SARS Coronavirus 2: NEGATIVE

## 2018-11-19 MED ORDER — LIDOCAINE-EPINEPHRINE (PF) 2 %-1:200000 IJ SOLN
INTRAMUSCULAR | Status: AC
  Filled 2018-11-19: qty 20

## 2018-11-19 NOTE — Progress Notes (Signed)
L upper arm fistula deaccessed. Manual pressure held x10 minutes due to bleeding. Dressings clean dry and intact. Family member at bedside.

## 2018-11-19 NOTE — ED Notes (Signed)
Patient Alert and oriented to baseline. Stable and ambulatory to baseline. Patient verbalized understanding of the discharge instructions.  Patient belongings were taken by the patient.   

## 2018-11-19 NOTE — ED Triage Notes (Signed)
Pt arrives via EMS from dialysis- completed 3/4 treatment. Pt continued to have low BP during treatment. They gave him small boluses of fluid, but did not help. Pt alert/oriented x4. Pt complaining of nausea and right knee pain. No other complaints. Dialysis told EMS pt is "not acting himself". EMS reports BP 143/60.

## 2018-11-19 NOTE — Discharge Instructions (Addendum)
Please eat and drink regularly Follow-up with orthopedist as scheduled tomorrow Return to the emergency department if you have worsening symptoms of weakness, fever, chest pain, or dyspnea.

## 2018-11-19 NOTE — ED Provider Notes (Signed)
[facility] EMERGENCY DEPARTMENT Provider Note   CSN: [id number] Arrival date & time: 11/19/18  1229     History   Chief Complaint Chief Complaint  Patient presents with  . Altered Mental Status    HPI Robert [name]. is a 83 y.o. male.     HPI  83 year old male end-stage renal disease on dialysis presents today via EMS with reports of altered mental status.  Patient states that they sent him because he was not as energetic or as alert as usual.  He states that he feels fuzzy.Denies lateralized weakness or speech difficulty.  Gait problems due to right knee pain. Right knee with pain but denies injury.   He felt okay yes third day as far as mental status but was having knee pain.  Because of his knee pain he did not eat until late.  His daughter brought him [employer].  He was unable to get up and fix anything.  This morning he got up and went to dialysis.  He is a diabetic but states he has not taken his insulin in the past 2 days.  He states that he is usually much more awake and energetic.  Today he felt very sleepy and fuzzy.  He denies taking any pain medication.  EMS reports that his blood sugar was 150 prehospital.  Patient denies any previous similar episodes.  He denies any falls or injury.  He denies headache, chest pain, cough, abdominal pain, nausea, vomiting, diarrhea, UTI symptoms, or COVID exposure.  Past Medical History:  Diagnosis Date  . Allergy    year round  . Anemia   . Cancer (HCC)    PSA ELEVATED  . Carotid artery occlusion   . Cataract   . Depression   . Diabetes mellitus   . Dyspnea   . ESRD (end stage renal disease) [facility])    [place]  . GERD (gastroesophageal reflux disease)   . Hearing deficit   . History of kidney stones   . Hyperlipidemia   . Hypertension   . Hypothyroidism   . Myocardial infarction ([place]) 2011  . Stroke (cerebrum) (HCC)    weakness in left hand  04/2015, 05/20/16  . Thyroid disease    hypothyroid    Patient Active Problem List   Diagnosis Date Noted  . CAD (coronary artery disease) 12/26/2017  . ESRD on dialysis ([place]) 12/22/2017  . Carotid stenosis, right 05/21/2016  . AKI (acute kidney injury) ([place]) 05/20/2016  . Dysarthria 05/20/2016  . Thrombocytopenia ([place]) 05/20/2016  . Chronic diastolic CHF (congestive heart failure) ([place]) 05/20/2016  . Slurred speech   . CKD (chronic kidney disease) stage V requiring chronic dialysis ([place]) 03/20/2016  . Stroke (cerebrum) ([place])   . History of stroke 05/13/2015  . Acute encephalopathy 05/13/2015  . Hypertensive urgency 05/13/2015  . Hyperlipidemia 05/13/2015  . Diabetes mellitus, type II, insulin dependent ([place]) 08/18/2009  . Anemia 08/18/2009  . Hypertensive heart disease with heart failure ([place]) 08/18/2009  . ALLERGIC RHINITIS, SEASONAL 08/18/2009  . PARAPHARYNGEAL ABSCESS 08/18/2009  . GERD 08/18/2009  . HIATAL HERNIA 08/18/2009  . DIVERTICULOSIS, COLON 08/18/2009  . 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 ARTERIOVENOUS MALFORMATION, COLON 08/18/2009  . DYSPHAGIA 08/18/2009  . COLONIC POLYPS, HX OF 08/18/2009  . RENAL CALCULUS, HX OF 08/18/2009  . ESOPHAGEAL STRICTURE 06/21/2009    Past Surgical History:  Procedure Laterality Date  . A/V FISTULAGRAM Right 06/24/2017   Procedure: A/V FISTULAGRAM;  Surgeon: Harold Barban  W, MD;  Location: [place] CV [facility];  Service: Cardiovascular;  Laterality: Right;  rt  lower arm  . AV FISTULA PLACEMENT Right 07/25/2016   Procedure: ARTERIOVENOUS (AV) FISTULA CREATION-RIGHT;  Surgeon: Serafina Mitchell, MD;  Location: [facility] [place];  Service: Vascular;  Laterality: Right;  . BASCILIC VEIN TRANSPOSITION Left 10/15/2017   Procedure: FIRST STAGE BASILIC VEIN TRANSPOSITION LEFT UPPER EXTREMITY;  Surgeon: Serafina Mitchell, MD;  Location: [place];  Service: Vascular;  Laterality: Left;  . BASCILIC VEIN TRANSPOSITION Left 12/10/2017   Procedure: SECOND STAGE BASILIC VEIN  TRANSPOSITION LEFT ARM;  Surgeon: Serafina Mitchell, MD;  [facility];  Service: Vascular;  Laterality: Left;  . COLONOSCOPY    . CORONARY ANGIOPLASTY WITH STENT PLACEMENT  07/2009   3 stents  . ELBOW SURGERY     LEFT  . ENDARTERECTOMY Right 06/06/2016   Procedure: RIGHT CAROTID ENDARTERECTOMY WITH PATCH ANGIOPLASTY;  Surgeon: Serafina Mitchell, MD;  Location: [facility];  Service: Vascular;  Laterality: Right;  . EP IMPLANTABLE DEVICE N/A 05/16/2015   Procedure: Loop Recorder Insertion;  Surgeon: Thompson Grayer, MD;  Location: [place] [facility];  Service: Cardiovascular;  Laterality: N/A;  . ESOPHAGOSCOPY WITH DILITATION    . FISTULA SUPERFICIALIZATION Right 12/05/2016   Procedure: Superficialization and Ligation of Branches OF RIGHT ARM RADIOCEPHALIC FISTULA;  Surgeon: Serafina Mitchell, MD;  Location: [place];  Service: Vascular;  Laterality: Right;  . NOSE SURGERY    . PERIPHERAL VASCULAR BALLOON ANGIOPLASTY  06/24/2017   Procedure: PERIPHERAL VASCULAR BALLOON ANGIOPLASTY;  Surgeon: Serafina Mitchell, MD;  Location: [place] [place] [facility];  Service: Cardiovascular;;  rt arm fistula   . stents    . TEE WITHOUT CARDIOVERSION N/A 05/16/2015   Procedure: TRANSESOPHAGEAL ECHOCARDIOGRAM (TEE);  Surgeon: Thayer Headings, MD;  Location: [place];  Service: Cardiovascular;  Laterality: N/A;        Home Medications    Prior to Admission medications   Medication Sig Start Date End Date Taking? Authorizing Provider  ACCU-CHEK AVIVA PLUS test strip  12/09/17   [provider]  aspirin 81 MG tablet Take 1 tablet (81 mg total) by mouth daily. 12/26/17   Richardo Priest, MD  calcium acetate (PHOSLO) 667 MG tablet Take 1,334 mg by mouth See admin instructions. Take 2 tablets (1334 mg) by mouth daily with any meals or snacks. 10/14/17   [provider]  clopidogrel (PLAVIX) 75 MG tablet Take 1 tablet (75 mg total) by mouth daily. 05/24/16   Reyne Dumas, MD  ezetimibe (ZETIA) 10 MG tablet Take 1  tablet (10 mg total) by mouth daily. 05/25/16   Reyne Dumas, MD  fexofenadine (ALLEGRA) 180 MG tablet Take 180 mg by mouth daily.    [provider]  folic acid-vitamin b complex-vitamin c-selenium-zinc (DIALYVITE) 3 MG TABS tablet Take 1 tablet by mouth daily.    [provider]  hydrALAZINE (APRESOLINE) 50 MG tablet Take 50 mg by mouth See admin instructions. Sunday, Monday, Wednesday & Friday patient takes 1 tablet (50 mg) by mouth twice daily. Tuesday, Thursday, & Saturdays take 1 tablet at bedtime 06/13/15   [provider]  insulin degludec (TRESIBA FLEXTOUCH) 100 UNIT/ML SOPN FlexTouch Pen Inject 25 Units into the skin at bedtime.     [provider]  levothyroxine (SYNTHROID, LEVOTHROID) 50 MCG tablet Take 50 mcg by mouth daily before breakfast.  03/03/15   [provider]  metoprolol succinate (TOPROL-XL) 50 MG 24 hr tablet  Take 50 mg by mouth. TAKE 1 TABLET BY MOUTH IN  THE EVENING TUESDAY,  THURSDAY, SATURDAY AND 1  TABLET TWICE DAILY ALL  OTHER DAYS    [provider]  omega-3 acid ethyl esters (LOVAZA) 1 g capsule Take 2 capsules by mouth 2 (two) times daily.    [provider]  pantoprazole (PROTONIX) 40 MG tablet Take 40 mg by mouth every evening.     [provider]  sertraline (ZOLOFT) 20 MG/ML concentrated solution Take 20 mg by mouth daily.  07/27/18   [provider]  simvastatin (ZOCOR) 40 MG tablet Take 1 tablet (40 mg total) by mouth daily at 6 PM. 05/17/15   Velvet Bathe, MD    Family History Family History  Problem Relation Age of Onset  . Diabetes Mother   . Dementia Maternal Grandfather   . Heart attack Neg Hx     Social History Social History   Tobacco Use  . Smoking status: Former Smoker    Types: Cigarettes  . Smokeless tobacco: Never Used  . Tobacco comment: quit age 24  Substance Use Topics  . Alcohol use: Yes    Alcohol/week: 2.0 standard drinks    Types: 2 Glasses of wine  per week    Comment: rarely  . Drug use: No     Allergies   Lisinopril   Review of Systems Review of Systems  Constitutional: Positive for activity change.  HENT: Positive for congestion.   Eyes: Negative.   Respiratory: Negative.   Cardiovascular: Negative.   Gastrointestinal: Negative.   Endocrine: Negative.   Genitourinary: Negative.   Musculoskeletal: Negative.   Skin: Negative.   Allergic/Immunologic: Negative.   Neurological: Negative.   Hematological: Negative.   Psychiatric/Behavioral: Negative.   All other systems reviewed and are negative.    Physical Exam Updated Vital Signs There were no vitals taken for this visit.  Physical Exam Vitals signs and nursing note reviewed.  Constitutional:      General: He is not in acute distress.    Appearance: Normal appearance.  HENT:     Head: Normocephalic.     Right Ear: External ear normal.     Left Ear: External ear normal.     Nose: Nose normal.     Mouth/Throat:     Mouth: Mucous membranes are moist.  Eyes:     Pupils: Pupils are equal, round, and reactive to light.  Neck:     Musculoskeletal: Normal range of motion.  Cardiovascular:     Rate and Rhythm: Normal rate and regular rhythm.  Pulmonary:     Effort: Pulmonary effort is normal.     Breath sounds: Normal breath sounds.  Abdominal:     General: Abdomen is flat.     Palpations: Abdomen is soft.  Musculoskeletal: Normal range of motion.     Comments: Left upper extremity with AV fistula with thrill No redness no swelling or tenderness Right knee with some swelling and tenderness on the superior anterior aspect No crepitus, no redness Pedal pulses intact  Skin:    General: Skin is warm and dry.     Capillary Refill: Capillary refill takes less than 2 seconds.  Neurological:     General: No focal deficit present.     Mental Status: He is alert.     Cranial Nerves: No cranial nerve deficit.     Sensory: No sensory deficit.     Motor: No  weakness.     Coordination: Coordination normal.  Psychiatric:        Mood and Affect: Mood normal.      ED Treatments / Results  Labs (all labs ordered are listed, but only abnormal results are displayed) Labs Reviewed - No data to display  EKG None  Radiology No results found.  Procedures .Joint Aspiration/Arthrocentesis  Date/Time: 11/19/2018 3:43 PM Performed by: Pattricia Boss, MD Authorized by: Pattricia Boss, MD   Consent:    Consent obtained:  Written   Consent given by:  Patient   Risks discussed:  Bleeding, infection and pain   Alternatives discussed:  No treatment Location:    Location:  Knee   Knee:  R knee Anesthesia (see MAR for exact dosages):    Anesthesia method:  Local infiltration   Local anesthetic:  Lidocaine 1% WITH epi Procedure details:    Preparation: Patient was prepped and draped in usual sterile fashion     Needle gauge:  18 G   Ultrasound guidance: no     Approach:  Lateral   Aspirate characteristics: no fluid.   Steroid injected: no     Specimen collected: no   Post-procedure details:    Dressing:  Sterile dressing   Patient tolerance of procedure:  Tolerated well, no immediate complications   (including critical care time)  Medications Ordered in ED Medications - No data to display   Initial Impression / Assessment and Plan / ED Course  I have reviewed the triage vital signs and the nursing notes.  Pertinent labs & imaging results that were available during my care of the patient were reviewed by me and considered in my medical decision making (see chart for details).    83 year old male end-stage renal disease on dialysis who has knee pain and had decreased p.o. intake secondary to ambulatory issues over the past 24 hours.  Today at dialysis he did not appear to be as active and bright as usual.  Secondary to this he was sent to the ED.  On evaluation here he is afebrile his blood pressure is normal and his oxygen sats are  normal.  He has some swelling and pain in his right knee.  This was x-rayed and there is no acute abnormality.  I attempted arthrocentesis but no fluid was obtained.  He currently has appearance of being back to baseline.  He is much more talkative and interactive.  His son-in-law has been at the bedside. Plan if labs are normal and head CT is normal, patient can likely be discharged to home. I called Dr. Nona Dell office and patient has appointment tomorrow in the office. Discussed with Dr. Francia Greaves and he will follow-up labs and imaging  Final Clinical Impressions(s) / ED Diagnoses   Final diagnoses:  Acute pain of right knee  Weakness    ED Discharge Orders    None       Pattricia Boss, MD 11/19/18 [phone]

## 2018-11-19 NOTE — ED Provider Notes (Signed)
Patient seen after prior ED provider.  Patient is comfortable and appears improved.  Patient desires discharge.  He declines further observation and/or admission.  He and his family have plan established for outpatient follow-up both with his regular care providers and also with orthopedics.  Importance of close follow-up was repeatedly stressed.  Strict return precautions given and understood.   Valarie Merino, MD 11/19/18 [phone]

## 2018-11-23 ENCOUNTER — Encounter: Payer: Medicare Other | Admitting: *Deleted

## 2018-12-12 ENCOUNTER — Other Ambulatory Visit: Payer: Self-pay | Admitting: Cardiology

## 2019-01-06 ENCOUNTER — Telehealth: Payer: Self-pay | Admitting: Cardiology

## 2019-01-06 NOTE — Telephone Encounter (Signed)
°*  STAT* If patient is at the pharmacy, call can be transferred to refill team.   1. Which medications need to be refilled? (please list name of each medication and dose if known) Omega 3 acid ethyl esters 1g capsule  2. Which pharmacy/location (including street and city if local pharmacy) is medication to be sent to?[facility]  3. Do they need a 30 day or 90 day supply? [place]

## 2019-01-07 MED ORDER — OMEGA-3-ACID ETHYL ESTERS 1 G PO CAPS: ORAL_CAPSULE | ORAL | 0 refills | Status: DC

## 2019-01-07 NOTE — Telephone Encounter (Signed)
Refill for lovaza sent to [facility] as requested.

## 2019-01-10 ENCOUNTER — Emergency Department (HOSPITAL_COMMUNITY): Payer: Medicare Other

## 2019-01-10 ENCOUNTER — Emergency Department (HOSPITAL_COMMUNITY)
Admission: EM | Admit: 2019-01-10 | Discharge: 2019-01-10 | Disposition: A | Discharge status: Home / Self Care | Payer: Medicare Other | Attending: Emergency Medicine | Admitting: Emergency Medicine

## 2019-01-10 ENCOUNTER — Other Ambulatory Visit: Payer: Self-pay

## 2019-01-10 ENCOUNTER — Encounter (HOSPITAL_COMMUNITY): Payer: Self-pay | Admitting: Emergency Medicine

## 2019-01-10 DIAGNOSIS — Z87891 Personal history of nicotine dependence: Secondary | ICD-10-CM | POA: Insufficient documentation

## 2019-01-10 DIAGNOSIS — N186 End stage renal disease: Secondary | ICD-10-CM | POA: Insufficient documentation

## 2019-01-10 DIAGNOSIS — R0789 Other chest pain: Secondary | ICD-10-CM

## 2019-01-10 DIAGNOSIS — E1122 Type 2 diabetes mellitus with diabetic chronic kidney disease: Secondary | ICD-10-CM | POA: Diagnosis not present

## 2019-01-10 DIAGNOSIS — Y999 Unspecified external cause status: Secondary | ICD-10-CM | POA: Diagnosis not present

## 2019-01-10 DIAGNOSIS — Z992 Dependence on renal dialysis: Secondary | ICD-10-CM | POA: Insufficient documentation

## 2019-01-10 DIAGNOSIS — Y92410 Unspecified street and highway as the place of occurrence of the external cause: Secondary | ICD-10-CM | POA: Insufficient documentation

## 2019-01-10 DIAGNOSIS — I120 Hypertensive chronic kidney disease with stage 5 chronic kidney disease or end stage renal disease: Secondary | ICD-10-CM | POA: Insufficient documentation

## 2019-01-10 DIAGNOSIS — E039 Hypothyroidism, unspecified: Secondary | ICD-10-CM | POA: Diagnosis not present

## 2019-01-10 DIAGNOSIS — Y9389 Activity, other specified: Secondary | ICD-10-CM | POA: Insufficient documentation

## 2019-01-10 HISTORY — DX: Dependence on renal dialysis: Z99.2

## 2019-01-10 LAB — CBC
HCT: 36.5 % — ABNORMAL LOW (ref 39.0–52.0)
Hemoglobin: 11.4 g/dL — ABNORMAL LOW (ref 13.0–17.0)
MCH: 30.8 pg (ref 26.0–34.0)
MCHC: 31.2 g/dL (ref 30.0–36.0)
MCV: 98.6 fL (ref 80.0–100.0)
Platelets: 136 10*3/uL — ABNORMAL LOW (ref 150–400)
RBC: 3.7 MIL/uL — ABNORMAL LOW (ref 4.22–5.81)
RDW: 14.9 % (ref 11.5–15.5)
WBC: 5.9 10*3/uL (ref 4.0–10.5)
nRBC: 0 % (ref 0.0–0.2)

## 2019-01-10 LAB — BASIC METABOLIC PANEL
Anion gap: 16 — ABNORMAL HIGH (ref 5–15)
BUN: 29 mg/dL — ABNORMAL HIGH (ref 8–23)
CO2: 25 mmol/L (ref 22–32)
Calcium: 9.4 mg/dL (ref 8.9–10.3)
Chloride: 99 mmol/L (ref 98–111)
Creatinine, Ser: 5.69 mg/dL — ABNORMAL HIGH (ref 0.61–1.24)
GFR calc Af Amer: 10 mL/min — ABNORMAL LOW (ref >60)
GFR calc non Af Amer: 9 mL/min — ABNORMAL LOW (ref >60)
Glucose, Bld: 282 mg/dL — ABNORMAL HIGH (ref 70–99)
Potassium: 4.3 mmol/L (ref 3.5–5.1)
Sodium: 140 mmol/L (ref 135–145)

## 2019-01-10 LAB — TROPONIN I (HIGH SENSITIVITY)
Troponin I (High Sensitivity): 35 ng/L — ABNORMAL HIGH (ref <18)
Troponin I (High Sensitivity): 39 ng/L — ABNORMAL HIGH (ref <18)

## 2019-01-10 MED ORDER — LIDOCAINE 5 % EX PTCH: 1.0000 | MEDICATED_PATCH | CUTANEOUS | 0 refills | Status: AC

## 2019-01-10 MED ORDER — HYDROCODONE-ACETAMINOPHEN 5-325 MG PO TABS: 1.0000 | ORAL_TABLET | ORAL | 0 refills | Status: AC | PRN

## 2019-01-10 MED ORDER — HYDROCODONE-ACETAMINOPHEN 5-325 MG PO TABS: 1.0000 | ORAL_TABLET | ORAL | 0 refills | Status: DC | PRN

## 2019-01-10 MED ORDER — LIDOCAINE 5 % EX PTCH: 1.0000 | MEDICATED_PATCH | CUTANEOUS | 0 refills | Status: DC

## 2019-01-10 MED ORDER — SODIUM CHLORIDE 0.9% FLUSH: 3.0000 mL | Freq: Once | INTRAVENOUS | Status: DC

## 2019-01-10 NOTE — Discharge Instructions (Addendum)
You were evaluated in the Emergency Department and after careful evaluation, we did not find any emergent condition requiring admission or further testing in the hospital.  Your exam/testing today is overall reassuring.  Please use the Lidoderm patches during the day for pain.  You can also use the Norco tablets for more significant pain.  Is important that you take deep breaths throughout the day.  Please return to the Emergency Department if you experience any worsening of your condition.  We encourage you to follow up with a primary care provider.  Thank you for allowing [place] to be a part of your care.

## 2019-01-10 NOTE — ED Notes (Signed)
Daughter- [phone] Call when Back in a room or for information.
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
## 2019-01-16 ENCOUNTER — Other Ambulatory Visit: Payer: Self-pay | Admitting: Cardiology

## 2019-01-18 NOTE — Telephone Encounter (Signed)
Metoprolol refill sent to [facility]

## 2019-02-22 ENCOUNTER — Other Ambulatory Visit: Payer: Self-pay

## 2019-02-22 ENCOUNTER — Emergency Department (HOSPITAL_COMMUNITY): Payer: Medicare Other

## 2019-02-22 ENCOUNTER — Inpatient Hospital Stay (HOSPITAL_COMMUNITY)
Admission: EM | Admit: 2019-02-22 | Discharge: 2019-03-29 | DRG: 233 | Disposition: E | Payer: Medicare Other | Attending: Thoracic Surgery (Cardiothoracic Vascular Surgery) | Admitting: Thoracic Surgery (Cardiothoracic Vascular Surgery)

## 2019-02-22 ENCOUNTER — Telehealth: Payer: Self-pay | Admitting: Cardiology

## 2019-02-22 DIAGNOSIS — J9690 Respiratory failure, unspecified, unspecified whether with hypoxia or hypercapnia: Secondary | ICD-10-CM

## 2019-02-22 DIAGNOSIS — E1122 Type 2 diabetes mellitus with diabetic chronic kidney disease: Secondary | ICD-10-CM | POA: Diagnosis present

## 2019-02-22 DIAGNOSIS — E440 Moderate protein-calorie malnutrition: Secondary | ICD-10-CM | POA: Diagnosis not present

## 2019-02-22 DIAGNOSIS — K219 Gastro-esophageal reflux disease without esophagitis: Secondary | ICD-10-CM | POA: Diagnosis present

## 2019-02-22 DIAGNOSIS — R6521 Severe sepsis with septic shock: Secondary | ICD-10-CM | POA: Diagnosis not present

## 2019-02-22 DIAGNOSIS — Z7982 Long term (current) use of aspirin: Secondary | ICD-10-CM

## 2019-02-22 DIAGNOSIS — J90 Pleural effusion, not elsewhere classified: Secondary | ICD-10-CM

## 2019-02-22 DIAGNOSIS — N186 End stage renal disease: Secondary | ICD-10-CM | POA: Diagnosis not present

## 2019-02-22 DIAGNOSIS — Z992 Dependence on renal dialysis: Secondary | ICD-10-CM

## 2019-02-22 DIAGNOSIS — A419 Sepsis, unspecified organism: Secondary | ICD-10-CM | POA: Diagnosis not present

## 2019-02-22 DIAGNOSIS — Z781 Physical restraint status: Secondary | ICD-10-CM

## 2019-02-22 DIAGNOSIS — Z87442 Personal history of urinary calculi: Secondary | ICD-10-CM

## 2019-02-22 DIAGNOSIS — Z794 Long term (current) use of insulin: Secondary | ICD-10-CM

## 2019-02-22 DIAGNOSIS — N2581 Secondary hyperparathyroidism of renal origin: Secondary | ICD-10-CM | POA: Diagnosis present

## 2019-02-22 DIAGNOSIS — Z7902 Long term (current) use of antithrombotics/antiplatelets: Secondary | ICD-10-CM

## 2019-02-22 DIAGNOSIS — Z6826 Body mass index (BMI) 26.0-26.9, adult: Secondary | ICD-10-CM

## 2019-02-22 DIAGNOSIS — I110 Hypertensive heart disease with heart failure: Secondary | ICD-10-CM | POA: Diagnosis present

## 2019-02-22 DIAGNOSIS — I25119 Atherosclerotic heart disease of native coronary artery with unspecified angina pectoris: Secondary | ICD-10-CM

## 2019-02-22 DIAGNOSIS — E875 Hyperkalemia: Secondary | ICD-10-CM | POA: Diagnosis not present

## 2019-02-22 DIAGNOSIS — I509 Heart failure, unspecified: Secondary | ICD-10-CM

## 2019-02-22 DIAGNOSIS — R680 Hypothermia, not associated with low environmental temperature: Secondary | ICD-10-CM | POA: Diagnosis not present

## 2019-02-22 DIAGNOSIS — J939 Pneumothorax, unspecified: Secondary | ICD-10-CM

## 2019-02-22 DIAGNOSIS — F0390 Unspecified dementia without behavioral disturbance: Secondary | ICD-10-CM | POA: Diagnosis present

## 2019-02-22 DIAGNOSIS — H9190 Unspecified hearing loss, unspecified ear: Secondary | ICD-10-CM | POA: Diagnosis present

## 2019-02-22 DIAGNOSIS — Z79899 Other long term (current) drug therapy: Secondary | ICD-10-CM

## 2019-02-22 DIAGNOSIS — E2740 Unspecified adrenocortical insufficiency: Secondary | ICD-10-CM | POA: Diagnosis not present

## 2019-02-22 DIAGNOSIS — R001 Bradycardia, unspecified: Secondary | ICD-10-CM | POA: Diagnosis not present

## 2019-02-22 DIAGNOSIS — E873 Alkalosis: Secondary | ICD-10-CM | POA: Diagnosis not present

## 2019-02-22 DIAGNOSIS — T82855A Stenosis of coronary artery stent, initial encounter: Secondary | ICD-10-CM | POA: Diagnosis present

## 2019-02-22 DIAGNOSIS — Z9689 Presence of other specified functional implants: Secondary | ICD-10-CM

## 2019-02-22 DIAGNOSIS — D62 Acute posthemorrhagic anemia: Secondary | ICD-10-CM | POA: Diagnosis not present

## 2019-02-22 DIAGNOSIS — Z4659 Encounter for fitting and adjustment of other gastrointestinal appliance and device: Secondary | ICD-10-CM

## 2019-02-22 DIAGNOSIS — Z79890 Hormone replacement therapy (postmenopausal): Secondary | ICD-10-CM

## 2019-02-22 DIAGNOSIS — Z66 Do not resuscitate: Secondary | ICD-10-CM | POA: Diagnosis not present

## 2019-02-22 DIAGNOSIS — I4891 Unspecified atrial fibrillation: Secondary | ICD-10-CM | POA: Diagnosis not present

## 2019-02-22 DIAGNOSIS — J9601 Acute respiratory failure with hypoxia: Secondary | ICD-10-CM | POA: Diagnosis not present

## 2019-02-22 DIAGNOSIS — I252 Old myocardial infarction: Secondary | ICD-10-CM

## 2019-02-22 DIAGNOSIS — G92 Toxic encephalopathy: Secondary | ICD-10-CM | POA: Diagnosis not present

## 2019-02-22 DIAGNOSIS — I350 Nonrheumatic aortic (valve) stenosis: Secondary | ICD-10-CM | POA: Diagnosis present

## 2019-02-22 DIAGNOSIS — K551 Chronic vascular disorders of intestine: Secondary | ICD-10-CM | POA: Diagnosis not present

## 2019-02-22 DIAGNOSIS — E874 Mixed disorder of acid-base balance: Secondary | ICD-10-CM | POA: Diagnosis not present

## 2019-02-22 DIAGNOSIS — Z87891 Personal history of nicotine dependence: Secondary | ICD-10-CM

## 2019-02-22 DIAGNOSIS — E785 Hyperlipidemia, unspecified: Secondary | ICD-10-CM | POA: Diagnosis present

## 2019-02-22 DIAGNOSIS — K7200 Acute and subacute hepatic failure without coma: Secondary | ICD-10-CM | POA: Diagnosis not present

## 2019-02-22 DIAGNOSIS — I5032 Chronic diastolic (congestive) heart failure: Secondary | ICD-10-CM | POA: Diagnosis present

## 2019-02-22 DIAGNOSIS — I214 Non-ST elevation (NSTEMI) myocardial infarction: Secondary | ICD-10-CM | POA: Diagnosis not present

## 2019-02-22 DIAGNOSIS — R7989 Other specified abnormal findings of blood chemistry: Secondary | ICD-10-CM

## 2019-02-22 DIAGNOSIS — I2510 Atherosclerotic heart disease of native coronary artery without angina pectoris: Secondary | ICD-10-CM | POA: Diagnosis present

## 2019-02-22 DIAGNOSIS — F329 Major depressive disorder, single episode, unspecified: Secondary | ICD-10-CM | POA: Diagnosis present

## 2019-02-22 DIAGNOSIS — Z833 Family history of diabetes mellitus: Secondary | ICD-10-CM

## 2019-02-22 DIAGNOSIS — Z951 Presence of aortocoronary bypass graft: Secondary | ICD-10-CM

## 2019-02-22 DIAGNOSIS — D631 Anemia in chronic kidney disease: Secondary | ICD-10-CM | POA: Diagnosis present

## 2019-02-22 DIAGNOSIS — Y831 Surgical operation with implant of artificial internal device as the cause of abnormal reaction of the patient, or of later complication, without mention of misadventure at the time of the procedure: Secondary | ICD-10-CM | POA: Diagnosis present

## 2019-02-22 DIAGNOSIS — I132 Hypertensive heart and chronic kidney disease with heart failure and with stage 5 chronic kidney disease, or end stage renal disease: Secondary | ICD-10-CM | POA: Diagnosis present

## 2019-02-22 DIAGNOSIS — J9602 Acute respiratory failure with hypercapnia: Secondary | ICD-10-CM | POA: Diagnosis not present

## 2019-02-22 DIAGNOSIS — E119 Type 2 diabetes mellitus without complications: Secondary | ICD-10-CM

## 2019-02-22 DIAGNOSIS — I4819 Other persistent atrial fibrillation: Secondary | ICD-10-CM | POA: Diagnosis present

## 2019-02-22 DIAGNOSIS — D65 Disseminated intravascular coagulation [defibrination syndrome]: Secondary | ICD-10-CM | POA: Diagnosis not present

## 2019-02-22 DIAGNOSIS — R230 Cyanosis: Secondary | ICD-10-CM | POA: Diagnosis not present

## 2019-02-22 DIAGNOSIS — Z8673 Personal history of transient ischemic attack (TIA), and cerebral infarction without residual deficits: Secondary | ICD-10-CM

## 2019-02-22 DIAGNOSIS — Z8719 Personal history of other diseases of the digestive system: Secondary | ICD-10-CM

## 2019-02-22 DIAGNOSIS — J690 Pneumonitis due to inhalation of food and vomit: Secondary | ICD-10-CM | POA: Diagnosis not present

## 2019-02-22 DIAGNOSIS — K6389 Other specified diseases of intestine: Secondary | ICD-10-CM | POA: Diagnosis not present

## 2019-02-22 DIAGNOSIS — K55049 Acute infarction of large intestine, extent unspecified: Secondary | ICD-10-CM | POA: Diagnosis not present

## 2019-02-22 DIAGNOSIS — L8990 Pressure ulcer of unspecified site, unspecified stage: Secondary | ICD-10-CM | POA: Insufficient documentation

## 2019-02-22 DIAGNOSIS — K660 Peritoneal adhesions (postprocedural) (postinfection): Secondary | ICD-10-CM | POA: Diagnosis present

## 2019-02-22 DIAGNOSIS — Z978 Presence of other specified devices: Secondary | ICD-10-CM

## 2019-02-22 DIAGNOSIS — I2584 Coronary atherosclerosis due to calcified coronary lesion: Secondary | ICD-10-CM | POA: Diagnosis present

## 2019-02-22 DIAGNOSIS — Z9911 Dependence on respirator [ventilator] status: Secondary | ICD-10-CM

## 2019-02-22 DIAGNOSIS — Z888 Allergy status to other drugs, medicaments and biological substances status: Secondary | ICD-10-CM

## 2019-02-22 DIAGNOSIS — E039 Hypothyroidism, unspecified: Secondary | ICD-10-CM | POA: Diagnosis present

## 2019-02-22 DIAGNOSIS — R778 Other specified abnormalities of plasma proteins: Secondary | ICD-10-CM

## 2019-02-22 DIAGNOSIS — Z20822 Contact with and (suspected) exposure to covid-19: Secondary | ICD-10-CM | POA: Diagnosis present

## 2019-02-22 DIAGNOSIS — Z452 Encounter for adjustment and management of vascular access device: Secondary | ICD-10-CM

## 2019-02-22 LAB — COMPREHENSIVE METABOLIC PANEL WITH GFR
ALT: 19 U/L (ref 0–44)
AST: 18 U/L (ref 15–41)
Albumin: 3.5 g/dL (ref 3.5–5.0)
Alkaline Phosphatase: 87 U/L (ref 38–126)
Anion gap: 14 (ref 5–15)
BUN: 34 mg/dL — ABNORMAL HIGH (ref 8–23)
CO2: 25 mmol/L (ref 22–32)
Calcium: 9.5 mg/dL (ref 8.9–10.3)
Chloride: 103 mmol/L (ref 98–111)
Creatinine, Ser: 6.23 mg/dL — ABNORMAL HIGH (ref 0.61–1.24)
GFR calc Af Amer: 9 mL/min — ABNORMAL LOW
GFR calc non Af Amer: 8 mL/min — ABNORMAL LOW
Glucose, Bld: 233 mg/dL — ABNORMAL HIGH (ref 70–99)
Potassium: 3.8 mmol/L (ref 3.5–5.1)
Sodium: 142 mmol/L (ref 135–145)
Total Bilirubin: 0.4 mg/dL (ref 0.3–1.2)
Total Protein: 6.5 g/dL (ref 6.5–8.1)

## 2019-02-22 LAB — CBC WITH DIFFERENTIAL/PLATELET
Abs Immature Granulocytes: 0.03 K/uL (ref 0.00–0.07)
Basophils Absolute: 0 K/uL (ref 0.0–0.1)
Basophils Relative: 1 %
Eosinophils Absolute: 0.1 K/uL (ref 0.0–0.5)
Eosinophils Relative: 2 %
HCT: 38.9 % — ABNORMAL LOW (ref 39.0–52.0)
Hemoglobin: 12.2 g/dL — ABNORMAL LOW (ref 13.0–17.0)
Immature Granulocytes: 1 %
Lymphocytes Relative: 16 %
Lymphs Abs: 0.9 K/uL (ref 0.7–4.0)
MCH: 31 pg (ref 26.0–34.0)
MCHC: 31.4 g/dL (ref 30.0–36.0)
MCV: 99 fL (ref 80.0–100.0)
Monocytes Absolute: 0.6 K/uL (ref 0.1–1.0)
Monocytes Relative: 11 %
Neutro Abs: 3.8 K/uL (ref 1.7–7.7)
Neutrophils Relative %: 69 %
Platelets: DECREASED K/uL (ref 150–400)
RBC: 3.93 MIL/uL — ABNORMAL LOW (ref 4.22–5.81)
RDW: 14.7 % (ref 11.5–15.5)
WBC: 5.5 K/uL (ref 4.0–10.5)
nRBC: 0 % (ref 0.0–0.2)

## 2019-02-22 LAB — PROTIME-INR
INR: 1 (ref 0.8–1.2)
Prothrombin Time: 13.2 s (ref 11.4–15.2)

## 2019-02-22 LAB — SARS CORONAVIRUS 2 (TAT 6-24 HRS): SARS Coronavirus 2: NEGATIVE

## 2019-02-22 LAB — HEMOGLOBIN A1C
Hgb A1c MFr Bld: 7.2 % — ABNORMAL HIGH (ref 4.8–5.6)
Mean Plasma Glucose: 159.94 mg/dL

## 2019-02-22 LAB — CBG MONITORING, ED: Glucose-Capillary: 199 mg/dL — ABNORMAL HIGH (ref 70–99)

## 2019-02-22 LAB — TROPONIN I (HIGH SENSITIVITY)
Troponin I (High Sensitivity): 81 ng/L — ABNORMAL HIGH (ref <18)
Troponin I (High Sensitivity): 856 ng/L (ref <18)

## 2019-02-22 LAB — APTT: aPTT: 23 s — ABNORMAL LOW (ref 24–36)

## 2019-02-22 MED ORDER — PANTOPRAZOLE SODIUM 40 MG PO TBEC
40.0000 mg | DELAYED_RELEASE_TABLET | Freq: Every evening | ORAL | Status: DC
  Administered 2019-02-22: 40 mg via ORAL
  Filled 2019-02-22: qty 1

## 2019-02-22 MED ORDER — HEPARIN (PORCINE) 25000 UT/250ML-% IV SOLN
1400.0000 [IU]/h | INTRAVENOUS | Status: DC
  Administered 2019-02-22: 1100 [IU]/h via INTRAVENOUS
  Filled 2019-02-22: qty 250

## 2019-02-22 MED ORDER — METOPROLOL SUCCINATE ER 50 MG PO TB24
50.0000 mg | ORAL_TABLET | Freq: Two times a day (BID) | ORAL | Status: DC
  Administered 2019-02-22 – 2019-02-23 (×3): 50 mg via ORAL
  Filled 2019-02-22 (×2): qty 1
  Filled 2019-02-22: qty 2

## 2019-02-22 MED ORDER — INSULIN ASPART 100 UNIT/ML ~~LOC~~ SOLN
0.0000 [IU] | SUBCUTANEOUS | Status: DC
  Administered 2019-02-22 – 2019-02-23 (×2): 2 [IU] via SUBCUTANEOUS
  Administered 2019-02-23: 3 [IU] via SUBCUTANEOUS
  Administered 2019-02-23: 2 [IU] via SUBCUTANEOUS

## 2019-02-22 MED ORDER — METOPROLOL TARTRATE 5 MG/5ML IV SOLN
5.0000 mg | Freq: Once | INTRAVENOUS | Status: AC
  Administered 2019-02-22: 18:00:00 5 mg via INTRAVENOUS
  Filled 2019-02-22: qty 5

## 2019-02-22 MED ORDER — HEPARIN BOLUS VIA INFUSION
4000.0000 [IU] | Freq: Once | INTRAVENOUS | Status: AC
  Administered 2019-02-22: 4000 [IU] via INTRAVENOUS
  Filled 2019-02-22: qty 4000

## 2019-02-22 MED ORDER — ONDANSETRON HCL 4 MG/2ML IJ SOLN: 4.0000 mg | Freq: Four times a day (QID) | INTRAMUSCULAR | Status: DC | PRN

## 2019-02-22 MED ORDER — SIMVASTATIN 20 MG PO TABS: 40.0000 mg | ORAL_TABLET | Freq: Every day | ORAL | Status: DC

## 2019-02-22 MED ORDER — ACETAMINOPHEN 325 MG PO TABS: 650.0000 mg | ORAL_TABLET | ORAL | Status: DC | PRN

## 2019-02-22 MED ORDER — LEVOTHYROXINE SODIUM 50 MCG PO TABS
50.0000 ug | ORAL_TABLET | Freq: Every day | ORAL | Status: DC
  Administered 2019-02-23: 50 ug via ORAL
  Filled 2019-02-22: qty 1

## 2019-02-22 MED ORDER — ASPIRIN EC 81 MG PO TBEC: 81.0000 mg | DELAYED_RELEASE_TABLET | Freq: Every day | ORAL | Status: DC

## 2019-02-22 MED ORDER — CALCIUM ACETATE (PHOS BINDER) 667 MG PO CAPS: 1334.0000 mg | ORAL_CAPSULE | Freq: Three times a day (TID) | ORAL | Status: DC

## 2019-02-22 NOTE — Progress Notes (Signed)
ANTICOAGULATION CONSULT NOTE - Initial Consult  Pharmacy Consult for heparin Indication: atrial fibrillation  Allergies  Allergen Reactions  . Lisinopril Other (See Comments)    "made me lose my voice"    Patient Measurements: Height: 5\' 8"  (172.7 cm) Weight: 165 lb (74.8 kg) IBW/kg (Calculated) : 68.4 Heparin Dosing Weight: 74.8kg  Vital Signs: Temp: 98.4 F (36.9 C) (12/28 1728) Temp Source: Oral (12/28 1728) BP: 148/63 (12/28 2030) Pulse Rate: 69 (12/28 1900)  Labs: Recent Labs    02/10/2019 1736 02/11/2019 1922  HGB 12.2*  --   HCT 38.9*  --   PLT PLATELET CLUMPS NOTED ON SMEAR, COUNT APPEARS DECREASED  --   APTT 23*  --   LABPROT 13.2  --   INR 1.0  --   CREATININE 6.23*  --   TROPONINIHS 81* 856*    Estimated Creatinine Clearance: 9 mL/min (A) (by C-G formula based on SCr of 6.23 mg/dL (H)).   Medical History: Past Medical History:  Diagnosis Date  . Allergy    year round  . Anemia   . Cancer (HCC)    PSA ELEVATED  . Carotid artery occlusion   . Cataract   . Depression   . Diabetes mellitus   . Dialysis patient ([place])   . Dyspnea   . ESRD (end stage renal disease) [facility])    [place]  . GERD (gastroesophageal reflux disease)   . Hearing deficit   . History of kidney stones   . Hyperlipidemia   . Hypertension   . Hypothyroidism   . Myocardial infarction ([place]) 2011  . Stroke (cerebrum) (HCC)    weakness in left hand  04/2015, 05/20/16  . Thyroid disease    hypothyroid    Medications:  Infusions:  . heparin      Assessment: 40 yom presented to the ED with tachycardia. Found to be in afib and now starting IV heparin. Pt is not on anticoagulation PTA. Hgb is slightly low and platelets are clumped (typically 120-130's in the past). No bleeding noted.   Goal of Therapy:  Heparin level 0.3-0.7 units/ml Monitor platelets by anticoagulation protocol: Yes   Plan:  Heparin bolus 4000 units IV x 1  Heparin gtt 1100 units/hr Check an 8  hr heparin level Daily heparin level and CBC  Robert [name], Rande Lawman 01/31/2019,9:04 PM

## 2019-02-22 NOTE — ED Triage Notes (Signed)
Pt called EMS today for body aches and malaise since 12p today, EMS found pt in afib with RVR (100-180bpm) on monitor. Provided 36mg  total cardizen en route. BP decreased from 180/110 to 122/80, no change otherwise. Afebrile, denies SOB, N/V, CP. Receives HD through L arm fistula, last received on Sunday and scheduled for tomorrow

## 2019-02-22 NOTE — ED Notes (Signed)
ED TO INPATIENT HANDOFF REPORT  ED Nurse Name and Phone #: [phone] Lauree Chandler., RN  S Name/Age/Gender Robert [name]. 83 y.o. male Room/Bed: 043C/043C  Code Status   Code Status: Full Code  Home/SNF/Other Home Patient oriented to: self, place, time and situation Is this baseline? Yes   Triage Complete: Triage complete  Chief Complaint New onset a-fib [facility]) [I48.91]  Triage Note Pt called EMS today for body aches and malaise since 12p today, EMS found pt in afib with RVR (100-180bpm) on monitor. Provided 36mg  total cardizen en route. BP decreased from 180/110 to 122/80, no change otherwise. Afebrile, denies SOB, N/V, CP. Receives HD through L arm fistula, last received on Sunday and scheduled for tomorrow    Allergies Allergies  Allergen Reactions  . Lisinopril Other (See Comments)    "made me lose my voice"    Level of Care/Admitting Diagnosis ED Disposition    ED Disposition Condition [place]: [place] [[id number]]  Level of Care: Telemetry Cardiac [103]  I expect the patient will be discharged within 24 hours: No (not a candidate for 5C-Observation unit)  Covid Evaluation: Asymptomatic Screening Protocol (No Symptoms)  Diagnosis: New onset a-fib [facility]) [[id number]]  Admitting Physician: Etta Quill [id number]  Attending Physician: Etta Quill [id number]       B Medical/Surgery History Past Medical History:  Diagnosis Date  . Allergy    year round  . Anemia   . Cancer (HCC)    PSA ELEVATED  . Carotid artery occlusion   . Cataract   . Depression   . Diabetes mellitus   . Dialysis patient ([place])   . Dyspnea   . ESRD (end stage renal disease) [facility])    [place]  . GERD (gastroesophageal reflux disease)   . Hearing deficit   . History of kidney stones   . Hyperlipidemia   . Hypertension   . Hypothyroidism   . Myocardial infarction ([place]) 2011  . Stroke (cerebrum) (HCC)    weakness in left hand   04/2015, 05/20/16  . Thyroid disease    hypothyroid   Past Surgical History:  Procedure Laterality Date  . A/V FISTULAGRAM Right 06/24/2017   Procedure: A/V FISTULAGRAM;  Surgeon: Serafina Mitchell, MD;  Location: [place] CV [facility];  Service: Cardiovascular;  Laterality: Right;  rt  lower arm  . AV FISTULA PLACEMENT Right 07/25/2016   Procedure: ARTERIOVENOUS (AV) FISTULA CREATION-RIGHT;  Surgeon: Serafina Mitchell, MD;  Location: [place];  Service: Vascular;  Laterality: Right;  . BASCILIC VEIN TRANSPOSITION Left 10/15/2017   Procedure: FIRST STAGE BASILIC VEIN TRANSPOSITION LEFT UPPER EXTREMITY;  Surgeon: Serafina Mitchell, MD;  Location: [place];  Service: Vascular;  Laterality: Left;  . BASCILIC VEIN TRANSPOSITION Left 12/10/2017   Procedure: SECOND STAGE BASILIC VEIN TRANSPOSITION LEFT ARM;  Surgeon: Serafina Mitchell, MD;  Location: [facility] OR;  Service: Vascular;  Laterality: Left;  . COLONOSCOPY    . CORONARY ANGIOPLASTY WITH STENT PLACEMENT  07/2009   3 stents  . ELBOW SURGERY     LEFT  . ENDARTERECTOMY Right 06/06/2016   Procedure: RIGHT CAROTID ENDARTERECTOMY WITH PATCH ANGIOPLASTY;  Surgeon: Serafina Mitchell, MD;  Location: [place];  Service: Vascular;  Laterality: Right;  . EP IMPLANTABLE DEVICE N/A 05/16/2015   Procedure: Loop Recorder Insertion;  Surgeon: Thompson Grayer, MD;  Location: [place] CV [facility];  Service: Cardiovascular;  Laterality: N/A;  . ESOPHAGOSCOPY WITH DILITATION    .  FISTULA SUPERFICIALIZATION Right 12/05/2016   Procedure: Superficialization and Ligation of Branches OF RIGHT ARM RADIOCEPHALIC FISTULA;  Surgeon: Serafina Mitchell, MD;  Location: [place];  Service: Vascular;  Laterality: Right;  . NOSE SURGERY    . PERIPHERAL VASCULAR BALLOON ANGIOPLASTY  06/24/2017   Procedure: PERIPHERAL VASCULAR BALLOON ANGIOPLASTY;  Surgeon: Serafina Mitchell, MD;  Location: [place] [facility];  Service: Cardiovascular;;  rt arm fistula   . stents    . TEE WITHOUT CARDIOVERSION N/A 05/16/2015    Procedure: TRANSESOPHAGEAL ECHOCARDIOGRAM (TEE);  Surgeon: Thayer Headings, MD;  Location: [facility] ENDOSCOPY;  Service: Cardiovascular;  Laterality: N/A;     A IV Location/Drains/Wounds Patient Lines/Drains/Airways Status   Active Line/Drains/Airways    Name:   Placement date:   Placement time:   Site:   Days:   Peripheral IV 02/13/2019 Right Wrist   02/21/2019    1739    Wrist   less than 1   Fistula / Graft Right Forearm Arteriovenous fistula   07/25/16    1027    Forearm   942   Fistula / Graft Left Upper arm Arteriovenous fistula   10/15/17    1539    Upper arm   495   Fistula / Graft Left Upper arm Arteriovenous fistula   12/10/17    1015    Upper arm   439   Hemodialysis Catheter Right Subclavian Double-lumen   --    --    Subclavian      Incision (Closed) 06/06/16 Neck Right   06/06/16    0840     991   Incision (Closed) 07/25/16 Arm Right   07/25/16    1038     942   Incision (Closed) 12/05/16 Arm Right   12/05/16    1136     809   Incision (Closed) 10/15/17 Arm Left   10/15/17    1539     495   Incision (Closed) 12/10/17 Arm Left   12/10/17    1019     439          Intake/Output Last 24 hours No intake or output data in the 24 hours ending 02/07/2019 2356  Labs/Imaging Results for orders placed or performed during the hospital encounter of 02/09/2019 (from the past 48 hour(s))  CBC with Differential     Status: Abnormal   Collection Time: 01/31/2019  5:36 PM  Result Value Ref Range   WBC 5.5 4.0 - 10.5 K/uL   RBC 3.93 (L) 4.22 - 5.81 MIL/uL   Hemoglobin 12.2 (L) 13.0 - 17.0 g/dL   HCT 38.9 (L) 39.0 - 52.0 %   MCV 99.0 80.0 - 100.0 fL   MCH 31.0 26.0 - 34.0 pg   MCHC 31.4 30.0 - 36.0 g/dL   RDW 14.7 11.5 - 15.5 %   Platelets  150 - 400 K/uL    PLATELET CLUMPS NOTED ON SMEAR, COUNT APPEARS DECREASED    Comment: PLATELET CLUMPS NOTED ON SMEAR, UNABLE TO ESTIMATE Immature Platelet Fraction may be clinically indicated, consider ordering this additional test [id number]    nRBC 0.0  0.0 - 0.2 %   Neutrophils Relative % 69 %   Neutro Abs 3.8 1.7 - 7.7 K/uL   Lymphocytes Relative 16 %   Lymphs Abs 0.9 0.7 - 4.0 K/uL   Monocytes Relative 11 %   Monocytes Absolute 0.6 0.1 - 1.0 K/uL   Eosinophils Relative 2 %   Eosinophils Absolute 0.1 0.0 -  0.5 K/uL   Basophils Relative 1 %   Basophils Absolute 0.0 0.0 - 0.1 K/uL   RBC Morphology MORPHOLOGY UNREMARKABLE    Immature Granulocytes 1 %   Abs Immature Granulocytes 0.03 0.00 - 0.07 K/uL    Comment: Performed at [facility], [place] [address] [place], [place] [address]  Comprehensive metabolic panel     Status: Abnormal   Collection Time: 01/30/2019  5:36 PM  Result Value Ref Range   Sodium 142 135 - 145 mmol/L   Potassium 3.8 3.5 - 5.1 mmol/L   Chloride 103 98 - 111 mmol/L   CO2 25 22 - 32 mmol/L   Glucose, Bld 233 (H) 70 - 99 mg/dL   BUN 34 (H) 8 - 23 mg/dL   Creatinine, Ser 6.23 (H) 0.61 - 1.24 mg/dL   Calcium 9.5 8.9 - 10.3 mg/dL   Total Protein 6.5 6.5 - 8.1 g/dL   Albumin 3.5 3.5 - 5.0 g/dL   AST 18 15 - 41 U/L   ALT 19 0 - 44 U/L   Alkaline Phosphatase 87 38 - 126 U/L   Total Bilirubin 0.4 0.3 - 1.2 mg/dL   GFR calc non Af Amer 8 (L) >60 mL/min   GFR calc Af Amer 9 (L) >60 mL/min   Anion gap 14 5 - 15    Comment: Performed at [place] [address] [place], [place] [address]  Troponin I (High Sensitivity)     Status: Abnormal   Collection Time: 01/31/2019  5:36 PM  Result Value Ref Range   Troponin I (High Sensitivity) 81 (H) <18 ng/L    Comment: (NOTE) Elevated high sensitivity troponin I (hsTnI) values and significant  changes across serial measurements may suggest ACS but many other  chronic and acute conditions are known to elevate hsTnI results.  Refer to the "Links" section for chest pain algorithms and additional  guidance. Performed at [facility], [place] [address] [place], [place] [address]   PTT     Status: Abnormal   Collection Time: 02/03/2019  5:36 PM  Result Value  Ref Range   aPTT 23 (L) 24 - 36 seconds    Comment: Performed at [place] [address] [place], [place] [address]  PT/INR     Status: None   Collection Time: 02/03/2019  5:36 PM  Result Value Ref Range   Prothrombin Time 13.2 11.4 - 15.2 seconds   INR 1.0 0.8 - 1.2    Comment: (NOTE) INR goal varies based on device and disease states. Performed at [facility], [place] [address], [place], [place] [address]   SARS CORONAVIRUS 2 (TAT 6-24 HRS) Nasopharyngeal Nasopharyngeal Swab     Status: None   Collection Time: 02/04/2019  5:36 PM   Specimen: Nasopharyngeal Swab  Result Value Ref Range   SARS Coronavirus 2 NEGATIVE NEGATIVE    Comment: (NOTE) SARS-CoV-2 target nucleic acids are NOT DETECTED. The SARS-CoV-2 RNA is generally detectable in upper and lower respiratory specimens during the acute phase of infection. Negative results do not preclude SARS-CoV-2 infection, do not rule out co-infections with other pathogens, and should not be used as the sole basis for treatment or other patient management decisions. Negative results must be combined with clinical observations, patient history, and epidemiological information. The expected result is Negative. Fact Sheet for Patients: SugarRoll.be Fact Sheet for Healthcare Providers: https://www.woods-mathews.com/ This test is not yet approved or cleared by the [place]  FDA and  has been authorized for detection and/or diagnosis of SARS-CoV-2 by FDA under an Emergency Use Authorization (EUA). This EUA will remain  in effect (meaning this test can be used) for the duration of the COVID-19 declaration under Section 56 4(b)(1) of the Act, 21 U.S.C. section 360bbb-3(b)(1), unless the authorization is terminated or revoked sooner. Performed at [facility], [place] [address] [place], [place] [address]   Troponin I (High Sensitivity)     Status: Abnormal   Collection Time:  02/14/2019  7:22 PM  Result Value Ref Range   Troponin I (High Sensitivity) 856 (HH) <18 ng/L    Comment: CRITICAL RESULT CALLED TO, READ BACK BY AND VERIFIED WITH: RN Elige Radon AT 2023 01/31/2019 BY L BENFIELD (NOTE) Elevated high sensitivity troponin I (hsTnI) values and significant  changes across serial measurements may suggest ACS but many other  chronic and acute conditions are known to elevate hsTnI results.  Refer to the Links section for chest pain algorithms and additional  guidance. Performed at [facility], [place] [address] [place], [place] [address]   Hemoglobin A1c     Status: Abnormal   Collection Time: 02/16/2019  9:00 PM  Result Value Ref Range   Hgb A1c MFr Bld 7.2 (H) 4.8 - 5.6 %    Comment: (NOTE) Pre diabetes:          5.7%-6.4% Diabetes:              >6.4% Glycemic control for   <7.0% adults with diabetes    Mean Plasma Glucose 159.94 mg/dL    Comment: Performed at [facility] [address] [place], [place] [address]  CBG monitoring, ED     Status: Abnormal   Collection Time: 02/04/2019  9:38 PM  Result Value Ref Range   Glucose-Capillary 199 (H) 70 - 99 mg/dL   DG Chest Port 1 View  Result Date: 02/07/2019 CLINICAL DATA:  Weakness. Body aches. Chest pain. Shortness of breath. EXAM: PORTABLE CHEST 1 VIEW COMPARISON:  01/10/2019 FINDINGS: Numerous leads and wires project over the chest. Loop recorder. Apical lordotic positioning. Mild cardiomegaly. Atherosclerosis in the transverse aorta. No pleural effusion or pneumothorax. Lucency at the apices, suggesting emphysema. No lobar consolidation. No congestive failure. IMPRESSION: Cardiomegaly, without acute disease. Aortic Atherosclerosis (ICD10-I70.0). Electronically Signed   By: Abigail Miyamoto M.D.   On: 01/28/2019 18:13    Pending Labs Unresulted Labs (From admission, onward)    Start     Ordered   02/13/2019 0500  Heparin level (unfractionated)  Daily,   R     01/27/2019 2102   02/06/2019 0530   Heparin level (unfractionated)  Once-Timed,   STAT     02/25/2019 2102   02/02/2019 0530  CBC  Daily,   R     02/19/2019 2102          Vitals/Pain Today's Vitals   02/25/2019 2115 02/15/2019 2130 02/12/2019 2303 01/28/2019 2305  BP: 137/62  129/75 (!) 181/80  Pulse: 62 73 73 79  Resp: (!) 7 14  14   Temp:      TempSrc:      SpO2: 94% 96%  97%  Weight:      Height:      PainSc:        Isolation Precautions No active isolations  Medications Medications  insulin aspart (novoLOG) injection 0-9 Units (2 Units Subcutaneous Given 02/13/2019 2148)  heparin ADULT infusion 100 units/mL (25000 units/284mL sodium chloride  0.45%) (1,100 Units/hr Intravenous New Bag/Given 02/02/2019 2142)  acetaminophen (TYLENOL) tablet 650 mg (has no administration in time range)  ondansetron (ZOFRAN) injection 4 mg (has no administration in time range)  metoprolol succinate (TOPROL-XL) 24 hr tablet 50 mg (50 mg Oral Given 02/18/2019 2303)  aspirin EC tablet 81 mg (has no administration in time range)  simvastatin (ZOCOR) tablet 40 mg (has no administration in time range)  levothyroxine (SYNTHROID) tablet 50 mcg (has no administration in time range)  pantoprazole (PROTONIX) EC tablet 40 mg (40 mg Oral Given 02/20/2019 2305)  calcium acetate (PHOSLO) tablet 1,334 mg (has no administration in time range)  metoprolol tartrate (LOPRESSOR) injection 5 mg (5 mg Intravenous Given 02/12/2019 1744)  heparin bolus via infusion 4,000 Units (4,000 Units Intravenous Bolus from Bag 01/31/2019 2142)    Mobility walks     Focused Assessments Cardiac Assessment Handoff:    Lab Results  Component Value Date   CKTOTAL 138 08/20/2009   CKMB (HH) 08/20/2009    9.0 CRITICAL VALUE NOTED.  VALUE IS CONSISTENT WITH PREVIOUSLY REPORTED AND CALLED VALUE.   TROPONINI (HH) 08/20/2009    2.93        POSSIBLE MYOCARDIAL ISCHEMIA. SERIAL TESTING RECOMMENDED. CRITICAL VALUE NOTED.  VALUE IS CONSISTENT WITH PREVIOUSLY REPORTED AND CALLED VALUE.    No results found for: DDIMER Does the Patient currently have chest pain? No     R Recommendations: See Admitting Provider Note  Report given to:   Additional Notes:

## 2019-02-22 NOTE — ED Notes (Addendum)
Son to provide ride [phone]

## 2019-02-22 NOTE — ED Provider Notes (Signed)
[name] Provider Note   CSN: [id number] Arrival date & time: 02/14/2019  1725     History Chief Complaint  Patient presents with  . Tachycardia    [name]. is a 83 y.o. male.  Patient is an 83 year old male with multiple medical problems including end-stage renal disease on dialysis, diabetes, CHF, stroke, CAD status post 2 stents who is presenting today with generalized weakness and not feeling well since about 2:00 this afternoon.  Patient states this morning when he woke up he was his normal self.  He went to the ophthalmologist and had his eyes evaluated and got home and still felt well then around 2:00 he just started feeling generally bad.  He reports feeling a little bit achy all over and feeling short of breath.  He denied any chest pain abdominal pain, nausea, vomiting or diarrhea.  He has not had any recent cough, fever or lower extremity swelling.  He does report that he has a loop recorder that has been in place for 3 years and the battery is now no longer functioning but he chose to just leave it there even though it is not helpful.  That initially was placed to survey for atrial fibrillation given his prior stroke.  He does continue on Plavix and aspirin but states he did not take any of his medications today and is not sure if he took them yesterday either because he sometimes forgets.  Patient did have dialysis yesterday due to the revised Christmas schedule but normally dialyzes on Tuesday Thursday and Saturday.  He has had no known Covid contacts.  Paramedics report when they arrived patient was in atrial fibrillation with RVR.  Patient had a heart rate at 180 and was given a total of 35 mg of Cardizem per their protocol with improvement of heart rate only to the 140s.  Upon arrival here patient states he does feel better but is still not feeling his normal self.  He continues to deny any chest pain.  The history is provided by  the patient.       Past Medical History:  Diagnosis Date  . Allergy    year round  . Anemia   . Cancer (HCC)    PSA ELEVATED  . Carotid artery occlusion   . Cataract   . Depression   . Diabetes mellitus   . Dialysis patient ([place])   . Dyspnea   . ESRD (end stage renal disease) [facility])    [place]  . GERD (gastroesophageal reflux disease)   . Hearing deficit   . History of kidney stones   . Hyperlipidemia   . Hypertension   . Hypothyroidism   . Myocardial infarction ([place]) 2011  . Stroke (cerebrum) (HCC)    weakness in left hand  04/2015, 05/20/16  . Thyroid disease    hypothyroid    Patient Active Problem List   Diagnosis Date Noted  . CAD (coronary artery disease) 12/26/2017  . ESRD on dialysis ([place]) 12/22/2017  . Carotid stenosis, right 05/21/2016  . AKI (acute kidney injury) ([place]) 05/20/2016  . Dysarthria 05/20/2016  . Thrombocytopenia ([place]) 05/20/2016  . Chronic diastolic CHF (congestive heart failure) ([place]) 05/20/2016  . Slurred speech   . CKD (chronic kidney disease) stage V requiring chronic dialysis ([place]) 03/20/2016  . Stroke (cerebrum) ([place])   . History of stroke 05/13/2015  . Acute encephalopathy 05/13/2015  . Hypertensive urgency 05/13/2015  . Hyperlipidemia 05/13/2015  .  Diabetes mellitus, type II, insulin dependent ([place]) 08/18/2009  . Anemia 08/18/2009  . Hypertensive heart disease with heart failure ([place]) 08/18/2009  . ALLERGIC RHINITIS, SEASONAL 08/18/2009  . PARAPHARYNGEAL ABSCESS 08/18/2009  . GERD 08/18/2009  . HIATAL HERNIA 08/18/2009  . DIVERTICULOSIS, COLON 08/18/2009  . 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 ARTERIOVENOUS MALFORMATION, COLON 08/18/2009  . DYSPHAGIA 08/18/2009  . COLONIC POLYPS, HX OF 08/18/2009  . RENAL CALCULUS, HX OF 08/18/2009  . ESOPHAGEAL STRICTURE 06/21/2009    Past Surgical History:  Procedure Laterality Date  . A/V FISTULAGRAM Right 06/24/2017   Procedure: A/V FISTULAGRAM;   Surgeon: Serafina Mitchell, MD;  Location: [place];  Service: Cardiovascular;  Laterality: Right;  rt  lower arm  . AV FISTULA PLACEMENT Right 07/25/2016   Procedure: ARTERIOVENOUS (AV) FISTULA CREATION-RIGHT;  Surgeon: Serafina Mitchell, MD;  Location: [facility];  Service: Vascular;  Laterality: Right;  . BASCILIC VEIN TRANSPOSITION Left 10/15/2017   Procedure: FIRST STAGE BASILIC VEIN TRANSPOSITION LEFT UPPER EXTREMITY;  Surgeon: Serafina Mitchell, MD;  Location: [facility];  Service: Vascular;  Laterality: Left;  . BASCILIC VEIN TRANSPOSITION Left 12/10/2017   Procedure: SECOND STAGE BASILIC VEIN TRANSPOSITION LEFT ARM;  Surgeon: Serafina Mitchell, MD;  [facility]: [facility];  Service: Vascular;  Laterality: Left;  . COLONOSCOPY    . CORONARY ANGIOPLASTY WITH STENT PLACEMENT  07/2009   3 stents  . ELBOW SURGERY     LEFT  . ENDARTERECTOMY Right 06/06/2016   Procedure: RIGHT CAROTID ENDARTERECTOMY WITH PATCH ANGIOPLASTY;  Surgeon: Serafina Mitchell, MD;  Location: [facility];  Service: Vascular;  Laterality: Right;  . EP IMPLANTABLE DEVICE N/A 05/16/2015   Procedure: Loop Recorder Insertion;  Surgeon: Thompson Grayer, MD;  Location: [place];  Service: Cardiovascular;  Laterality: N/A;  . ESOPHAGOSCOPY WITH DILITATION    . FISTULA SUPERFICIALIZATION Right 12/05/2016   Procedure: Superficialization and Ligation of Branches OF RIGHT ARM RADIOCEPHALIC FISTULA;  Surgeon: Serafina Mitchell, MD;  Location: [facility];  Service: Vascular;  Laterality: Right;  . NOSE SURGERY    . PERIPHERAL VASCULAR BALLOON ANGIOPLASTY  06/24/2017   Procedure: PERIPHERAL VASCULAR BALLOON ANGIOPLASTY;  Surgeon: Serafina Mitchell, MD;  Location: [facility];  Service: Cardiovascular;;  rt arm fistula   . stents    . TEE WITHOUT CARDIOVERSION N/A 05/16/2015   Procedure: TRANSESOPHAGEAL ECHOCARDIOGRAM (TEE);  Surgeon: Thayer Headings, MD;  [facility] END[facility]COPY;  Service: Cardiovascular;  Laterality: N/A;       Family History    Problem Relation Age of Onset  . Diabetes Mother   . Dementia Maternal Grandfather   . Heart attack Neg Hx     Social History   Tobacco Use  . Smoking status: Former Smoker    Types: Cigarettes  . Smokeless tobacco: Never Used  . Tobacco comment: quit age 48  Substance Use Topics  . Alcohol use: Yes    Alcohol/week: 2.0 standard drinks    Types: 2 Glasses of wine per week    Comment: rarely  . Drug use: No    Home Medications Prior to Admission medications   Medication Sig Start Date End Date Taking? Authorizing Provider  ACCU-CHEK AVIVA PLUS test strip  12/09/17   [provider]  aspirin 81 MG tablet Take 1 tablet (81 mg total) by mouth daily. 12/26/17   Richardo Priest, MD  calcium acetate (PHOSLO) 667 MG tablet Take 1,334 mg by mouth See admin  instructions. Take 2 tablets (1334 mg) by mouth daily with any meals or snacks. 10/14/17   [provider]  clopidogrel (PLAVIX) 75 MG tablet Take 1 tablet (75 mg total) by mouth daily. 05/24/16   Reyne Dumas, MD  ezetimibe (ZETIA) 10 MG tablet Take 1 tablet (10 mg total) by mouth daily. 05/25/16   Reyne Dumas, MD  fexofenadine (ALLEGRA) 180 MG tablet Take 180 mg by mouth daily.    [provider]  folic acid-vitamin b complex-vitamin c-selenium-zinc (DIALYVITE) 3 MG TABS tablet Take 1 tablet by mouth daily.    [provider]  hydrALAZINE (APRESOLINE) 50 MG tablet Take 50 mg by mouth See admin instructions. Sunday, Monday, Wednesday & Friday patient takes 1 tablet (50 mg) by mouth twice daily. Tuesday, Thursday, & Saturdays take 1 tablet at bedtime 06/13/15   [provider]  HYDROcodone-acetaminophen (NORCO/VICODIN) 5-325 MG tablet Take 1 tablet by mouth every 4 (four) hours as needed. 01/10/19   Maudie Flakes, MD  insulin degludec (TRESIBA FLEXTOUCH) 100 UNIT/ML SOPN FlexTouch Pen Inject 25 Units into the skin at bedtime.     [provider]  levothyroxine (SYNTHROID, LEVOTHROID)  50 MCG tablet Take 50 mcg by mouth daily before breakfast.  03/03/15   [provider]  lidocaine (LIDODERM) 5 % Place 1 patch onto the skin daily. Remove & Discard patch within 12 hours or as directed by MD 01/10/19   Maudie Flakes, MD  metoprolol succinate (TOPROL-XL) 50 MG 24 hr tablet TAKE 1 TABLET BY MOUTH IN  THE EVENING TUESDAY,  THURSDAY, SATURDAY AND 1  TABLET TWICE DAILY ALL  OTHER DAYS 01/18/19   Richardo Priest, MD  omega-3 acid ethyl esters (LOVAZA) 1 g capsule TAKE 2 CAPSULES (2 G TOTAL) BY MOUTH 2 (TWO) TIMES DAILY. 01/07/19   Richardo Priest, MD  pantoprazole (PROTONIX) 40 MG tablet Take 40 mg by mouth every evening.     [provider]  sertraline (ZOLOFT) 20 MG/ML concentrated solution Take 20 mg by mouth daily.  07/27/18   [provider]  simvastatin (ZOCOR) 40 MG tablet Take 1 tablet (40 mg total) by mouth daily at 6 PM. 05/17/15   Velvet Bathe, MD    Allergies    Lisinopril  Review of Systems   Review of Systems  All other systems reviewed and are negative.   Physical Exam Updated Vital Signs BP (!) 119/104 (BP Location: Right Arm)   Pulse (!) 148   Temp 98.4 F (36.9 C) (Oral)   Resp 18   SpO2 98%   Physical Exam Vitals and nursing note reviewed.  Constitutional:      General: He is not in acute distress.    Appearance: He is well-developed and normal weight.  HENT:     Head: Normocephalic and atraumatic.     Mouth/Throat:     Mouth: Mucous membranes are moist.  Eyes:     Conjunctiva/sclera: Conjunctivae normal.     Pupils: Pupils are equal, round, and reactive to light.  Cardiovascular:     Rate and Rhythm: Tachycardia present. Rhythm irregularly irregular.     Pulses: Normal pulses.     Heart sounds: No murmur.  Pulmonary:     Effort: Pulmonary effort is normal. No respiratory distress.     Breath sounds: Normal breath sounds. No wheezing or rales.  Abdominal:     General: There is no distension.     Palpations: Abdomen  is soft.  Tenderness: There is no abdominal tenderness. There is no guarding or rebound.  Musculoskeletal:        General: No tenderness. Normal range of motion.     Cervical back: Normal range of motion and neck supple.     Right lower leg: No edema.     Left lower leg: No edema.     Comments: Fistula present in the left upper arm with palpable thrill  Skin:    General: Skin is warm and dry.     Capillary Refill: Capillary refill takes less than 2 seconds.     Findings: No erythema or rash.  Neurological:     General: No focal deficit present.     Mental Status: He is alert and oriented to person, place, and time. Mental status is at baseline.  Psychiatric:        Mood and Affect: Mood normal.        Behavior: Behavior normal.        Thought Content: Thought content normal.     ED Results / Procedures / Treatments   Labs (all labs ordered are listed, but only abnormal results are displayed) Labs Reviewed  CBC WITH DIFFERENTIAL/PLATELET - Abnormal; Notable for the following components:      Result Value   RBC 3.93 (*)    Hemoglobin 12.2 (*)    HCT 38.9 (*)    All other components within normal limits  COMPREHENSIVE METABOLIC PANEL - Abnormal; Notable for the following components:   Glucose, Bld 233 (*)    BUN 34 (*)    Creatinine, Ser 6.23 (*)    GFR calc non Af Amer 8 (*)    GFR calc Af Amer 9 (*)    All other components within normal limits  APTT - Abnormal; Notable for the following components:   aPTT 23 (*)    All other components within normal limits  TROPONIN I (HIGH SENSITIVITY) - Abnormal; Notable for the following components:   Troponin I (High Sensitivity) 81 (*)    All other components within normal limits  TROPONIN I (HIGH SENSITIVITY) - Abnormal; Notable for the following components:   Troponin I (High Sensitivity) 856 (*)    All other components within normal limits  SARS CORONAVIRUS 2 (TAT 6-24 HRS)  PROTIME-INR    EKG EKG  Interpretation  Date/Time:  Monday February 22 2019 17:25:27 EST Ventricular Rate:  158 PR Interval:    QRS Duration: 95 QT Interval:  293 QTC Calculation: 475 R Axis:   44 Text Interpretation: new Atrial fibrillation with rapid V-rate LVH with secondary repolarization abnormality ST depression, probably rate related Baseline wander in lead(s) I aVR Confirmed by Blanchie Dessert [phone]) on 01/30/2019 5:42:08 PM   EKG Interpretation  Date/Time:  Monday February 22 2019 18:14:37 EST Ventricular Rate:  70 PR Interval:    QRS Duration: 92 QT Interval:  407 QTC Calculation: 440 R Axis:   40 Text Interpretation: Sinus rhythm Prolonged PR interval Nonspecific repol abnormality, diffuse leads Atrial fibrillation RESOLVED SINCE PREVIOUS Now EKG looks similar to prior Confirmed by Blanchie Dessert ([id number]) on 02/12/2019 6:25:41 PM       Radiology DG Chest Port 1 View  Result Date: 02/20/2019 CLINICAL DATA:  Weakness. Body aches. Chest pain. Shortness of breath. EXAM: PORTABLE CHEST 1 VIEW COMPARISON:  01/10/2019 FINDINGS: Numerous leads and wires project over the chest. Loop recorder. Apical lordotic positioning. Mild cardiomegaly. Atherosclerosis in the transverse aorta. No pleural effusion or pneumothorax. Lucency at the apices,  suggesting emphysema. No lobar consolidation. No congestive failure. IMPRESSION: Cardiomegaly, without acute disease. Aortic Atherosclerosis (ICD10-I70.0). Electronically Signed   By: Abigail Miyamoto M.D.   On: 02/25/2019 18:13    Procedures Procedures (including critical care time)  Medications Ordered in ED Medications  metoprolol tartrate (LOPRESSOR) injection 5 mg (has no administration in time range)    ED Course  I have reviewed the triage vital signs and the nursing notes.  Pertinent labs & imaging results that were available during my care of the patient were reviewed by me and considered in my medical decision making (see chart for details).     MDM Rules/Calculators/A&P                     83 year old male presenting today with atrial fibrillation with RVR.  Patient has never been diagnosed with atrial fibrillation before but has been surveillance to for it through a loop recorder which was last evaluated on 7/20 and showed no evidence of atrial fibrillation.  Patient does take Plavix and aspirin because of prior stroke.  He was in his normal state of health today until 2:00 this afternoon when he started to feel weak, short of breath and achy all over.  Patient was found to have heart rate in the 180s and did receive multiple rounds of Cardizem with only minimal improvement.  Patient here is in no acute distress able to give his full history and speaking in full sentences.  Oxygen saturation is 98% on room air.  Patient has no evidence of fluid overload today had full course of dialysis yesterday but does report that he did not take any of his medications today 1 of which is metoprolol and he is not sure if he took them yesterday either because he forgets.  Lower suspicion for ACS at this time as patient is having no chest pain and feel symptoms are most likely from the atrial fibrillation with RVR.  Will give IV Lopressor and send labs to rule out other underlying causes for atrial fibrillation.  At this time patient may possibly be a candidate for cardioversion as his symptoms started abruptly at 2:00 but he is not anticoagulated so will discuss with cardiology pending lab results and response to Lopressor.  6:26 PM] After 5 of IV Lopressor patient converted to a sinus rhythm.  EKG does show some ST depression in inferior lateral leads but this seems to be more chronic and findings consistent with LVH.  Second repeat EKG after this looks similar.  Patient is now resting in blood pressure is 130/70.  Chest x-ray without acute process.  Hemoglobin stable at 12.  This patients CHA2DS2-VASc Score and unadjusted Ischemic Stroke Rate (% per year) is  equal to 10.8 % stroke rate/year from a score of 8  Above score calculated as 1 point each if present [CHF, HTN, DM, Vascular=MI/PAD/Aortic Plaque, Age if 65-74, or Male] Above score calculated as 2 points each if present [Age > 75, or Stroke/TIA/TE]  8:41 PM Delta troponin has increased to 856 from 81.  Electrolytes are within normal limits.  Patient is feeling better and continues to be in sinus rhythm.  Discussed with cardiology who recommended overnight observation, trending troponins, echo in the morning.  They also recommended starting heparin at this time but it will need to be decided what anticoagulation is appropriate for him.  Also he will need to start back on his home dose of metoprolol to ensure that we will keep him  rate controlled.  She recommended hospitalist admission.  Patient will be due for dialysis tomorrow per his normal schedule.  Information was discussed with his daughter.  Patient is comfortable with this plan.  CRITICAL CARE Performed by: Deone Omahoney Total critical care time: 30 minutes Critical care time was exclusive of separately billable procedures and treating other patients. Critical care was necessary to treat or prevent imminent or life-threatening deterioration. Critical care was time spent personally by me on the following activities: development of treatment plan with patient and/or surrogate as well as nursing, discussions with consultants, evaluation of patient's response to treatment, examination of patient, obtaining history from patient or surrogate, ordering and performing treatments and interventions, ordering and review of laboratory studies, ordering and review of radiographic studies, pulse oximetry and re-evaluation of patient's condition.    Final Clinical Impression(s) / ED Diagnoses Final diagnoses:  Atrial fibrillation with RVR ([place])  Elevated troponin    Rx / DC Orders ED Discharge Orders    None       Blanchie Dessert,  MD 02/11/2019 2042

## 2019-02-22 NOTE — H&P (Signed)
History and Physical    Robert [name]. WOE:[id number] DOB: [birth year] DOA: 02/12/2019  PCP: Burnard Bunting, MD  Patient coming from: Home  I have personally briefly reviewed patient's old medical records in [place]  Chief Complaint: Tachycardia  HPI: Robert [name]. is a 83 y.o. male with medical history significant of ESRD on TTS dialysis, HTN, DM2, CAD s/p 3 stents, stroke s/p CEA.  Patient has loop recorder in place after stroke which never detected A.Fib until the battery finally died (left in place because he didn't want a surgery to remove it).  Patient states this morning when he woke up he was his normal self.  He went to the ophthalmologist and had his eyes evaluated and got home and still felt well then around 2:00 he just started feeling generally bad.  He reports feeling a little bit achy all over and feeling short of breath.  He denied any chest pain abdominal pain, nausea, vomiting or diarrhea.  He has not had any recent cough, fever or lower extremity swelling.   He does continue on Plavix and aspirin but states he did not take any of his medications today and is not sure if he took them yesterday either because he sometimes forgets.  EMS called, A.Fib RVR with rate 180, given total of 35mg  cardizem IV and HR came down to 140.   ED Course: In ED given 5 of metoprolol and he converted out of A.Fib and into sinus.  Continues to deny chest pain.  Trop however, elevated in ED from 81 to 856 on repeat.  Cards consulted and recd overnight obs admit, 2d echo in AM.   Review of Systems: As per HPI, otherwise all review of systems negative.  Past Medical History:  Diagnosis Date  . Allergy    year round  . Anemia   . Cancer (HCC)    PSA ELEVATED  . Carotid artery occlusion   . Cataract   . Depression   . Diabetes mellitus   . Dialysis patient ([place])   . Dyspnea   . ESRD (end stage renal disease) [facility])    [place]  . GERD  (gastroesophageal reflux disease)   . Hearing deficit   . History of kidney stones   . Hyperlipidemia   . Hypertension   . Hypothyroidism   . Myocardial infarction ([place]) 2011  . Stroke (cerebrum) (HCC)    weakness in left hand  04/2015, 05/20/16  . Thyroid disease    hypothyroid    Past Surgical History:  Procedure Laterality Date  . A/V FISTULAGRAM Right 06/24/2017   Procedure: A/V FISTULAGRAM;  Surgeon: Serafina Mitchell, MD;  Location: [place] [facility];  Service: Cardiovascular;  Laterality: Right;  rt  lower arm  . AV FISTULA PLACEMENT Right 07/25/2016   Procedure: ARTERIOVENOUS (AV) FISTULA CREATION-RIGHT;  Surgeon: Serafina Mitchell, MD;  Location: [place];  Service: Vascular;  Laterality: Right;  . BASCILIC VEIN TRANSPOSITION Left 10/15/2017   Procedure: FIRST STAGE BASILIC VEIN TRANSPOSITION LEFT UPPER EXTREMITY;  Surgeon: Serafina Mitchell, MD;  Location: [place];  Service: Vascular;  Laterality: Left;  . BASCILIC VEIN TRANSPOSITION Left 12/10/2017   Procedure: SECOND STAGE BASILIC VEIN TRANSPOSITION LEFT ARM;  Surgeon: Serafina Mitchell, MD;  [facility]: [facility];  Service: Vascular;  Laterality: Left;  . COLONOSCOPY    . CORONARY ANGIOPLASTY WITH STENT PLACEMENT  07/2009   3 stents  . ELBOW SURGERY     LEFT  .  ENDARTERECTOMY Right 06/06/2016   Procedure: RIGHT CAROTID ENDARTERECTOMY WITH PATCH ANGIOPLASTY;  Surgeon: Serafina Mitchell, MD;  Location: [place];  Service: Vascular;  Laterality: Right;  . EP IMPLANTABLE DEVICE N/A 05/16/2015   Procedure: Loop Recorder Insertion;  Surgeon: Thompson Grayer, MD;  Location: [place] [facility];  Service: Cardiovascular;  Laterality: N/A;  . ESOPHAGOSCOPY WITH DILITATION    . FISTULA SUPERFICIALIZATION Right 12/05/2016   Procedure: Superficialization and Ligation of Branches OF RIGHT ARM RADIOCEPHALIC FISTULA;  Surgeon: Serafina Mitchell, MD;  Location: [place];  Service: Vascular;  Laterality: Right;  . NOSE SURGERY    . PERIPHERAL VASCULAR BALLOON  ANGIOPLASTY  06/24/2017   Procedure: PERIPHERAL VASCULAR BALLOON ANGIOPLASTY;  Surgeon: Serafina Mitchell, MD;  Location: [place] [facility];  Service: Cardiovascular;;  rt arm fistula   . stents    . TEE WITHOUT CARDIOVERSION N/A 05/16/2015   Procedure: TRANSESOPHAGEAL ECHOCARDIOGRAM (TEE);  Surgeon: Thayer Headings, MD;  Location: [facility];  Service: Cardiovascular;  Laterality: N/A;     reports that he has quit smoking. His smoking use included cigarettes. He has never used smokeless tobacco. He reports current alcohol use of about 2.0 standard drinks of alcohol per week. He reports that he does not use drugs.  Allergies  Allergen Reactions  . Lisinopril Other (See Comments)    "made me lose my voice"    Family History  Problem Relation Age of Onset  . Diabetes Mother   . Dementia Maternal Grandfather   . Heart attack Neg Hx      Prior to Admission medications   Medication Sig Start Date End Date Taking? Authorizing Provider  ACCU-CHEK AVIVA PLUS test strip  12/09/17   [provider]  aspirin 81 MG tablet Take 1 tablet (81 mg total) by mouth daily. 12/26/17   Richardo Priest, MD  calcium acetate (PHOSLO) 667 MG tablet Take 1,334 mg by mouth See admin instructions. Take 2 tablets (1334 mg) by mouth daily with any meals or snacks. 10/14/17   [provider]  clopidogrel (PLAVIX) 75 MG tablet Take 1 tablet (75 mg total) by mouth daily. 05/24/16   Reyne Dumas, MD  ezetimibe (ZETIA) 10 MG tablet Take 1 tablet (10 mg total) by mouth daily. 05/25/16   Reyne Dumas, MD  fexofenadine (ALLEGRA) 180 MG tablet Take 180 mg by mouth daily.    [provider]  folic acid-vitamin b complex-vitamin c-selenium-zinc (DIALYVITE) 3 MG TABS tablet Take 1 tablet by mouth daily.    [provider]  hydrALAZINE (APRESOLINE) 50 MG tablet Take 50 mg by mouth See admin instructions. Sunday, Monday, Wednesday & Friday patient takes 1 tablet (50 mg) by mouth twice  daily. Tuesday, Thursday, & Saturdays take 1 tablet at bedtime 06/13/15   [provider]  HYDROcodone-acetaminophen (NORCO/VICODIN) 5-325 MG tablet Take 1 tablet by mouth every 4 (four) hours as needed. 01/10/19   Maudie Flakes, MD  insulin degludec (TRESIBA FLEXTOUCH) 100 UNIT/ML SOPN FlexTouch Pen Inject 25 Units into the skin at bedtime.     [provider]  levothyroxine (SYNTHROID, LEVOTHROID) 50 MCG tablet Take 50 mcg by mouth daily before breakfast.  03/03/15   [provider]  lidocaine (LIDODERM) 5 % Place 1 patch onto the skin daily. Remove & Discard patch within 12 hours or as directed by MD 01/10/19   Maudie Flakes, MD  metoprolol succinate (TOPROL-XL) 50 MG 24 hr tablet TAKE 1 TABLET BY MOUTH IN  THE EVENING TUESDAY,  THURSDAY, SATURDAY AND 1  TABLET TWICE DAILY ALL  OTHER DAYS 01/18/19   Richardo Priest, MD  omega-3 acid ethyl esters (LOVAZA) 1 g capsule TAKE 2 CAPSULES (2 G TOTAL) BY MOUTH 2 (TWO) TIMES DAILY. 01/07/19   Richardo Priest, MD  pantoprazole (PROTONIX) 40 MG tablet Take 40 mg by mouth every evening.     [provider]  sertraline (ZOLOFT) 20 MG/ML concentrated solution Take 20 mg by mouth daily.  07/27/18   [provider]  simvastatin (ZOCOR) 40 MG tablet Take 1 tablet (40 mg total) by mouth daily at 6 PM. 05/17/15   Velvet Bathe, MD    Physical Exam: Vitals:   02/11/2019 2030 02/08/2019 2100 01/28/2019 2115 02/24/2019 2130  BP: (!) 148/63 (!) 142/51 137/62   Pulse:  64 62 73  Resp: 15 16 (!) 7 14  Temp:      TempSrc:      SpO2:  96% 94% 96%  Weight:  74.8 kg    Height:  5\' 8"  (1.727 m)      Constitutional: NAD, calm, comfortable Eyes: PERRL, lids and conjunctivae normal ENMT: Mucous membranes are moist. Posterior pharynx clear of any exudate or lesions.Normal dentition.  Neck: normal, supple, no masses, no thyromegaly Respiratory: clear to auscultation bilaterally, no wheezing, no crackles. Normal respiratory effort. No  accessory muscle use.  Cardiovascular: Regular rate and rhythm, no murmurs / rubs / gallops. No extremity edema. 2+ pedal pulses. No carotid bruits.  Abdomen: no tenderness, no masses palpated. No hepatosplenomegaly. Bowel sounds positive.  Musculoskeletal: no clubbing / cyanosis. No joint deformity upper and lower extremities. Good ROM, no contractures. Normal muscle tone.  Skin: no rashes, lesions, ulcers. No induration Neurologic: CN 2-12 grossly intact. Sensation intact, DTR normal. Strength 5/5 in all 4.  Psychiatric: Normal judgment and insight. Alert and oriented x 3. Normal mood.    Labs on Admission: I have personally reviewed following labs and imaging studies  CBC: Recent Labs  Lab 02/01/2019 1736  WBC 5.5  NEUTROABS 3.8  HGB 12.2*  HCT 38.9*  MCV 99.0  PLT PLATELET CLUMPS NOTED ON SMEAR, COUNT APPEARS DECREASED   Basic Metabolic Panel: Recent Labs  Lab 02/21/2019 1736  NA 142  K 3.8  CL 103  CO2 25  GLUCOSE 233*  BUN 34*  CREATININE 6.23*  CALCIUM 9.5   GFR: Estimated Creatinine Clearance: 9 mL/min (A) (by C-G formula based on SCr of 6.23 mg/dL (H)). Liver Function Tests: Recent Labs  Lab 01/30/2019 1736  AST 18  ALT 19  ALKPHOS 87  BILITOT 0.4  PROT 6.5  ALBUMIN 3.5   No results for input(s): LIPASE, AMYLASE in the last 168 hours. No results for input(s): AMMONIA in the last 168 hours. Coagulation Profile: Recent Labs  Lab 02/21/2019 1736  INR 1.0   Cardiac Enzymes: No results for input(s): CKTOTAL, CKMB, CKMBINDEX, TROPONINI in the last 168 hours. BNP (last 3 results) No results for input(s): PROBNP in the last 8760 hours. HbA1C: No results for input(s): HGBA1C in the last 72 hours. CBG: Recent Labs  Lab 02/14/2019 2138  GLUCAP 199*   Lipid Profile: No results for input(s): CHOL, HDL, LDLCALC, TRIG, CHOLHDL, LDLDIRECT in the last 72 hours. Thyroid Function Tests: No results for input(s): TSH, T4TOTAL, FREET4, T3FREE, THYROIDAB in the last 72  hours. Anemia Panel: No results for input(s): VITAMINB12, FOLATE, FERRITIN, TIBC, IRON, RETICCTPCT in the last 72 hours. Urine analysis:  Component Value Date/Time   COLORURINE YELLOW (A) 10/13/2018 2007   APPEARANCEUR CLEAR (A) 10/13/2018 2007   LABSPEC 1.014 10/13/2018 2007   PHURINE 7.0 10/13/2018 2007   GLUCOSEU >=500 (A) 10/13/2018 2007   HGBUR NEGATIVE 10/13/2018 2007   BILIRUBINUR NEGATIVE 10/13/2018 2007   [place] NEGATIVE 10/13/2018 2007   PROTEINUR 100 (A) 10/13/2018 2007   UROBILINOGEN 0.2 06/19/2009 1333   NITRITE NEGATIVE 10/13/2018 2007   LEUKOCYTESUR NEGATIVE 10/13/2018 2007    Radiological Exams on Admission: DG Chest Port 1 View  Result Date: 02/15/2019 CLINICAL DATA:  Weakness. Body aches. Chest pain. Shortness of breath. EXAM: PORTABLE CHEST 1 VIEW COMPARISON:  01/10/2019 FINDINGS: Numerous leads and wires project over the chest. Loop recorder. Apical lordotic positioning. Mild cardiomegaly. Atherosclerosis in the transverse aorta. No pleural effusion or pneumothorax. Lucency at the apices, suggesting emphysema. No lobar consolidation. No congestive failure. IMPRESSION: Cardiomegaly, without acute disease. Aortic Atherosclerosis (ICD10-I70.0). Electronically Signed   By: Abigail Miyamoto M.D.   On: 02/04/2019 18:13    EKG: Independently reviewed.  Assessment/Plan Principal Problem:   New onset a-fib ([place]) Active Problems:   Diabetes mellitus, type II, insulin dependent ([place])   Hypertensive heart disease with heart failure (HCC)   CKD (chronic kidney disease) stage V requiring chronic dialysis (HCC)   CAD (coronary artery disease)   Atrial fibrillation with RVR ([place])    1. New onset a.fib RVR - with significant trop elevation as noted in setting of CAD history 1. NSR after metoprolol 2. Tele monitor 3. Will put on home lopressor but just dose 50mg  BID every day (instead of BID on 4 days and daily on other 3). 4. Heparin gtt - chads vasc is 8 5. Continue  ASA 81 daily 6. Will hold plavix that he takes for stroke prevention for the moment since we are putting on heparin. 7. Serial trops 8. 2d echo 9. NPO after MN 10. Cards eval in AM - demand ischemia? does he need a LHC? 2. HTN - 1. Increase lopressor as above 2. Holding hydralazine. 3. ESRD - 1. Call nephrology in AM for routine dialysis during stay 4. DM2 - 1. Sensitive SSI Q4H  DVT prophylaxis: Heparin gtt Code Status: Full Family Communication: No family in room Disposition Plan: Home after admit Consults called: EDP called cardiology Admission status: Place in 57     Enma Maeda, [place] [facility]  How to contact the [facility] Attending or Consulting provider [place] or covering provider during after hours [place], for this patient?  1. Check the care team in [facility] and look for a) attending/consulting [facility] provider listed and b) the [facility] team listed 2. Log into [email or web address]  Amion Physician Scheduling and messaging for groups and whole hospitals  On call and physician scheduling software for group practices, residents, hospitalists and other medical providers for call, clinic, rotation and shift schedules. On[employer] is a hospital-wide system for scheduling doctors and paging doctors on call. EasyPlot is for scientific plotting and data analysis.  [email or web address]  and use [place]'s universal password to access. If you do not have the password, please contact the hospital operator.  3. Locate the [facility] provider you are looking for under [facility] and page to a number that you can be directly reached. 4. If you still have difficulty reaching the provider, please page the [facility] (Director on Call) for the Hospitalists listed on amion for assistance.  02/09/2019, 10:00 PM

## 2019-02-22 NOTE — Telephone Encounter (Signed)
New message    Patients son calling to report patient being taken via EMS to [facility]; afib, weakness.

## 2019-02-23 ENCOUNTER — Inpatient Hospital Stay (HOSPITAL_COMMUNITY): Payer: Medicare Other

## 2019-02-23 ENCOUNTER — Ambulatory Visit (HOSPITAL_COMMUNITY): Payer: Medicare Other

## 2019-02-23 ENCOUNTER — Encounter (HOSPITAL_COMMUNITY)
Admission: EM | Disposition: E | Payer: Self-pay | Source: Home / Self Care | Attending: Thoracic Surgery (Cardiothoracic Vascular Surgery)

## 2019-02-23 DIAGNOSIS — Y831 Surgical operation with implant of artificial internal device as the cause of abnormal reaction of the patient, or of later complication, without mention of misadventure at the time of the procedure: Secondary | ICD-10-CM | POA: Diagnosis present

## 2019-02-23 DIAGNOSIS — K7200 Acute and subacute hepatic failure without coma: Secondary | ICD-10-CM | POA: Diagnosis not present

## 2019-02-23 DIAGNOSIS — I361 Nonrheumatic tricuspid (valve) insufficiency: Secondary | ICD-10-CM | POA: Diagnosis not present

## 2019-02-23 DIAGNOSIS — I509 Heart failure, unspecified: Secondary | ICD-10-CM | POA: Diagnosis not present

## 2019-02-23 DIAGNOSIS — D62 Acute posthemorrhagic anemia: Secondary | ICD-10-CM | POA: Diagnosis not present

## 2019-02-23 DIAGNOSIS — Z20822 Contact with and (suspected) exposure to covid-19: Secondary | ICD-10-CM | POA: Diagnosis present

## 2019-02-23 DIAGNOSIS — G92 Toxic encephalopathy: Secondary | ICD-10-CM | POA: Diagnosis not present

## 2019-02-23 DIAGNOSIS — R6521 Severe sepsis with septic shock: Secondary | ICD-10-CM | POA: Diagnosis not present

## 2019-02-23 DIAGNOSIS — Z951 Presence of aortocoronary bypass graft: Secondary | ICD-10-CM | POA: Diagnosis not present

## 2019-02-23 DIAGNOSIS — J9601 Acute respiratory failure with hypoxia: Secondary | ICD-10-CM | POA: Diagnosis not present

## 2019-02-23 DIAGNOSIS — J9602 Acute respiratory failure with hypercapnia: Secondary | ICD-10-CM | POA: Diagnosis not present

## 2019-02-23 DIAGNOSIS — I132 Hypertensive heart and chronic kidney disease with heart failure and with stage 5 chronic kidney disease, or end stage renal disease: Secondary | ICD-10-CM | POA: Diagnosis present

## 2019-02-23 DIAGNOSIS — E2740 Unspecified adrenocortical insufficiency: Secondary | ICD-10-CM | POA: Diagnosis not present

## 2019-02-23 DIAGNOSIS — T82855A Stenosis of coronary artery stent, initial encounter: Secondary | ICD-10-CM | POA: Diagnosis present

## 2019-02-23 DIAGNOSIS — I5032 Chronic diastolic (congestive) heart failure: Secondary | ICD-10-CM | POA: Diagnosis present

## 2019-02-23 DIAGNOSIS — J690 Pneumonitis due to inhalation of food and vomit: Secondary | ICD-10-CM | POA: Diagnosis not present

## 2019-02-23 DIAGNOSIS — I25812 Atherosclerosis of bypass graft of coronary artery of transplanted heart without angina pectoris: Secondary | ICD-10-CM | POA: Diagnosis not present

## 2019-02-23 DIAGNOSIS — N2581 Secondary hyperparathyroidism of renal origin: Secondary | ICD-10-CM | POA: Diagnosis present

## 2019-02-23 DIAGNOSIS — I214 Non-ST elevation (NSTEMI) myocardial infarction: Principal | ICD-10-CM

## 2019-02-23 DIAGNOSIS — I4891 Unspecified atrial fibrillation: Secondary | ICD-10-CM

## 2019-02-23 DIAGNOSIS — I4819 Other persistent atrial fibrillation: Secondary | ICD-10-CM | POA: Diagnosis present

## 2019-02-23 DIAGNOSIS — Z66 Do not resuscitate: Secondary | ICD-10-CM | POA: Diagnosis not present

## 2019-02-23 DIAGNOSIS — I480 Paroxysmal atrial fibrillation: Secondary | ICD-10-CM | POA: Diagnosis not present

## 2019-02-23 DIAGNOSIS — K55049 Acute infarction of large intestine, extent unspecified: Secondary | ICD-10-CM | POA: Diagnosis not present

## 2019-02-23 DIAGNOSIS — A419 Sepsis, unspecified organism: Secondary | ICD-10-CM | POA: Diagnosis not present

## 2019-02-23 DIAGNOSIS — I340 Nonrheumatic mitral (valve) insufficiency: Secondary | ICD-10-CM | POA: Diagnosis not present

## 2019-02-23 DIAGNOSIS — I25110 Atherosclerotic heart disease of native coronary artery with unstable angina pectoris: Secondary | ICD-10-CM | POA: Diagnosis not present

## 2019-02-23 DIAGNOSIS — Z9911 Dependence on respirator [ventilator] status: Secondary | ICD-10-CM | POA: Diagnosis not present

## 2019-02-23 DIAGNOSIS — I2510 Atherosclerotic heart disease of native coronary artery without angina pectoris: Secondary | ICD-10-CM | POA: Diagnosis not present

## 2019-02-23 DIAGNOSIS — E440 Moderate protein-calorie malnutrition: Secondary | ICD-10-CM | POA: Diagnosis not present

## 2019-02-23 DIAGNOSIS — N186 End stage renal disease: Secondary | ICD-10-CM | POA: Diagnosis present

## 2019-02-23 DIAGNOSIS — J90 Pleural effusion, not elsewhere classified: Secondary | ICD-10-CM | POA: Diagnosis not present

## 2019-02-23 DIAGNOSIS — D65 Disseminated intravascular coagulation [defibrination syndrome]: Secondary | ICD-10-CM | POA: Diagnosis not present

## 2019-02-23 DIAGNOSIS — Z992 Dependence on renal dialysis: Secondary | ICD-10-CM | POA: Diagnosis not present

## 2019-02-23 DIAGNOSIS — R000 Tachycardia, unspecified: Secondary | ICD-10-CM | POA: Diagnosis not present

## 2019-02-23 DIAGNOSIS — E874 Mixed disorder of acid-base balance: Secondary | ICD-10-CM | POA: Diagnosis not present

## 2019-02-23 HISTORY — PX: LEFT HEART CATH AND CORONARY ANGIOGRAPHY: CATH118249

## 2019-02-23 LAB — TROPONIN I (HIGH SENSITIVITY)
Troponin I (High Sensitivity): 4043 ng/L (ref <18)
Troponin I (High Sensitivity): 4270 ng/L (ref <18)
Troponin I (High Sensitivity): 4018 ng/L (ref <18)

## 2019-02-23 LAB — CBC
HCT: 36.7 % — ABNORMAL LOW (ref 39.0–52.0)
Hemoglobin: 12.4 g/dL — ABNORMAL LOW (ref 13.0–17.0)
MCH: 33.7 pg (ref 26.0–34.0)
MCHC: 33.8 g/dL (ref 30.0–36.0)
MCV: 99.7 fL (ref 80.0–100.0)
Platelets: 123 10*3/uL — ABNORMAL LOW (ref 150–400)
RBC: 3.68 MIL/uL — ABNORMAL LOW (ref 4.22–5.81)
RDW: 14.5 % (ref 11.5–15.5)
WBC: 5 10*3/uL (ref 4.0–10.5)
nRBC: 0 % (ref 0.0–0.2)

## 2019-02-23 LAB — GLUCOSE, CAPILLARY
Glucose-Capillary: 105 mg/dL — ABNORMAL HIGH (ref 70–99)
Glucose-Capillary: 151 mg/dL — ABNORMAL HIGH (ref 70–99)
Glucose-Capillary: 168 mg/dL — ABNORMAL HIGH (ref 70–99)
Glucose-Capillary: 59 mg/dL — ABNORMAL LOW (ref 70–99)
Glucose-Capillary: 97 mg/dL (ref 70–99)
Glucose-Capillary: 99 mg/dL (ref 70–99)

## 2019-02-23 LAB — HEPARIN LEVEL (UNFRACTIONATED)
Heparin Unfractionated: 0.13 IU/mL — ABNORMAL LOW (ref 0.30–0.70)
Heparin Unfractionated: 0.99 IU/mL — ABNORMAL HIGH (ref 0.30–0.70)

## 2019-02-23 LAB — PREPARE RBC (CROSSMATCH)

## 2019-02-23 LAB — ECHOCARDIOGRAM COMPLETE
Height: 68 in
Weight: 2600 oz

## 2019-02-23 LAB — PLATELET INHIBITION P2Y12: Platelet Function  P2Y12: 201 [PRU] (ref 182–335)

## 2019-02-23 LAB — TSH: TSH: 3.084 u[IU]/mL (ref 0.350–4.500)

## 2019-02-23 LAB — POCT ACTIVATED CLOTTING TIME: Activated Clotting Time: 142 seconds

## 2019-02-23 SURGERY — LEFT HEART CATH AND CORONARY ANGIOGRAPHY
Anesthesia: LOCAL

## 2019-02-23 MED ORDER — SODIUM CHLORIDE 0.9 % IV SOLN
1.5000 g | INTRAVENOUS | Status: AC
  Administered 2019-02-24: 1.5 g via INTRAVENOUS
  Filled 2019-02-23: qty 1.5

## 2019-02-23 MED ORDER — MIDAZOLAM HCL 2 MG/2ML IJ SOLN
INTRAMUSCULAR | Status: DC | PRN
  Administered 2019-02-23: 1 mg via INTRAVENOUS

## 2019-02-23 MED ORDER — ASPIRIN 81 MG PO CHEW
81.0000 mg | CHEWABLE_TABLET | ORAL | Status: AC
  Administered 2019-02-23: 81 mg via ORAL
  Filled 2019-02-23: qty 1

## 2019-02-23 MED ORDER — LIDOCAINE HCL (PF) 1 % IJ SOLN
INTRAMUSCULAR | Status: AC
  Filled 2019-02-23: qty 30

## 2019-02-23 MED ORDER — MILRINONE LACTATE IN DEXTROSE 20-5 MG/100ML-% IV SOLN
0.3000 ug/kg/min | INTRAVENOUS | Status: DC
  Filled 2019-02-23: qty 100

## 2019-02-23 MED ORDER — HEPARIN BOLUS VIA INFUSION
2000.0000 [IU] | Freq: Once | INTRAVENOUS | Status: AC
  Administered 2019-02-23: 2000 [IU] via INTRAVENOUS
  Filled 2019-02-23: qty 2000

## 2019-02-23 MED ORDER — LABETALOL HCL 5 MG/ML IV SOLN: 10.0000 mg | INTRAVENOUS | Status: AC | PRN

## 2019-02-23 MED ORDER — ALTEPLASE 2 MG IJ SOLR: 2.0000 mg | Freq: Once | INTRAMUSCULAR | Status: DC | PRN

## 2019-02-23 MED ORDER — PHENYLEPHRINE HCL-NACL 20-0.9 MG/250ML-% IV SOLN
30.0000 ug/min | INTRAVENOUS | Status: AC
  Administered 2019-02-24: 20 ug/min via INTRAVENOUS
  Filled 2019-02-23: qty 250

## 2019-02-23 MED ORDER — IOHEXOL 350 MG/ML SOLN
INTRAVENOUS | Status: DC | PRN
  Administered 2019-02-23: 65 mL

## 2019-02-23 MED ORDER — PLASMA-LYTE 148 IV SOLN
INTRAVENOUS | Status: AC
  Administered 2019-02-24: 500 mL
  Filled 2019-02-23: qty 2.5

## 2019-02-23 MED ORDER — SODIUM CHLORIDE 0.9 % IV SOLN: 250.0000 mL | INTRAVENOUS | Status: DC | PRN

## 2019-02-23 MED ORDER — SODIUM CHLORIDE 0.9 % WEIGHT BASED INFUSION: 3.0000 mL/kg/h | INTRAVENOUS | Status: DC

## 2019-02-23 MED ORDER — CHLORHEXIDINE GLUCONATE CLOTH 2 % EX PADS
6.0000 | MEDICATED_PAD | Freq: Once | CUTANEOUS | Status: AC
  Administered 2019-02-23: 6 via TOPICAL

## 2019-02-23 MED ORDER — SODIUM CHLORIDE 0.9 % WEIGHT BASED INFUSION: 1.0000 mL/kg/h | INTRAVENOUS | Status: DC

## 2019-02-23 MED ORDER — SODIUM CHLORIDE 0.9 % IV SOLN
INTRAVENOUS | Status: DC
  Filled 2019-02-23: qty 30

## 2019-02-23 MED ORDER — CHLORHEXIDINE GLUCONATE 0.12 % MT SOLN
15.0000 mL | Freq: Once | OROMUCOSAL | Status: AC
  Administered 2019-02-24: 15 mL via OROMUCOSAL
  Filled 2019-02-23: qty 15

## 2019-02-23 MED ORDER — HEPARIN (PORCINE) 25000 UT/250ML-% IV SOLN
1200.0000 [IU]/h | INTRAVENOUS | Status: DC
  Administered 2019-02-24: 1200 [IU]/h via INTRAVENOUS
  Filled 2019-02-23 (×2): qty 250

## 2019-02-23 MED ORDER — DEXMEDETOMIDINE HCL IN NACL 400 MCG/100ML IV SOLN
0.1000 ug/kg/h | INTRAVENOUS | Status: AC
  Administered 2019-02-24: .2 ug/kg/h via INTRAVENOUS
  Filled 2019-02-23: qty 100

## 2019-02-23 MED ORDER — NITROGLYCERIN IN D5W 200-5 MCG/ML-% IV SOLN
2.0000 ug/min | INTRAVENOUS | Status: AC
  Administered 2019-02-24: 16.6 ug/min via INTRAVENOUS
  Filled 2019-02-23: qty 250

## 2019-02-23 MED ORDER — CHLORHEXIDINE GLUCONATE CLOTH 2 % EX PADS
6.0000 | MEDICATED_PAD | Freq: Every day | CUTANEOUS | Status: DC
  Administered 2019-02-27: 10:00:00 6 via TOPICAL

## 2019-02-23 MED ORDER — POTASSIUM CHLORIDE 2 MEQ/ML IV SOLN
80.0000 meq | INTRAVENOUS | Status: DC
  Filled 2019-02-23: qty 40

## 2019-02-23 MED ORDER — NOREPINEPHRINE 4 MG/250ML-% IV SOLN
0.0000 ug/min | INTRAVENOUS | Status: DC
  Filled 2019-02-23: qty 250

## 2019-02-23 MED ORDER — HYDRALAZINE HCL 20 MG/ML IJ SOLN
INTRAMUSCULAR | Status: DC | PRN
  Administered 2019-02-23: 10 mg via INTRAVENOUS

## 2019-02-23 MED ORDER — LIDOCAINE HCL (PF) 1 % IJ SOLN
INTRAMUSCULAR | Status: DC | PRN
  Administered 2019-02-23: 15 mL via SUBCUTANEOUS

## 2019-02-23 MED ORDER — HYDRALAZINE HCL 20 MG/ML IJ SOLN: 10.0000 mg | INTRAMUSCULAR | Status: DC | PRN

## 2019-02-23 MED ORDER — SODIUM CHLORIDE 0.9 % IV SOLN: 100.0000 mL | INTRAVENOUS | Status: DC | PRN

## 2019-02-23 MED ORDER — SODIUM CHLORIDE 0.9% FLUSH: 3.0000 mL | INTRAVENOUS | Status: DC | PRN

## 2019-02-23 MED ORDER — SODIUM CHLORIDE 0.9% FLUSH
3.0000 mL | Freq: Two times a day (BID) | INTRAVENOUS | Status: DC
  Administered 2019-02-23: 3 mL via INTRAVENOUS

## 2019-02-23 MED ORDER — TEMAZEPAM 15 MG PO CAPS: 15.0000 mg | ORAL_CAPSULE | Freq: Once | ORAL | Status: DC | PRN

## 2019-02-23 MED ORDER — HEPARIN (PORCINE) IN NACL 1000-0.9 UT/500ML-% IV SOLN
INTRAVENOUS | Status: DC | PRN
  Administered 2019-02-23 (×2): 500 mL

## 2019-02-23 MED ORDER — TRANEXAMIC ACID (OHS) PUMP PRIME SOLUTION
2.0000 mg/kg | INTRAVENOUS | Status: DC
  Filled 2019-02-23: qty 1.47

## 2019-02-23 MED ORDER — MANNITOL 20 % IV SOLN
Freq: Once | INTRAVENOUS | Status: DC
  Filled 2019-02-23: qty 13

## 2019-02-23 MED ORDER — VANCOMYCIN HCL 1250 MG/250ML IV SOLN
1250.0000 mg | INTRAVENOUS | Status: AC
  Administered 2019-02-24: 1250 mg via INTRAVENOUS
  Filled 2019-02-23 (×2): qty 250

## 2019-02-23 MED ORDER — LIDOCAINE HCL (PF) 1 % IJ SOLN: 5.0000 mL | INTRAMUSCULAR | Status: DC | PRN

## 2019-02-23 MED ORDER — TRANEXAMIC ACID (OHS) BOLUS VIA INFUSION
15.0000 mg/kg | INTRAVENOUS | Status: AC
  Administered 2019-02-24: 1105.5 mg via INTRAVENOUS
  Filled 2019-02-23: qty 1106

## 2019-02-23 MED ORDER — FENTANYL CITRATE (PF) 100 MCG/2ML IJ SOLN
INTRAMUSCULAR | Status: AC
  Filled 2019-02-23: qty 2

## 2019-02-23 MED ORDER — SODIUM CHLORIDE 0.9 % IV SOLN
750.0000 mg | INTRAVENOUS | Status: DC
  Filled 2019-02-23: qty 750

## 2019-02-23 MED ORDER — POTASSIUM CHLORIDE CRYS ER 20 MEQ PO TBCR
20.0000 meq | EXTENDED_RELEASE_TABLET | Freq: Once | ORAL | Status: AC
  Administered 2019-02-23: 20 meq via ORAL
  Filled 2019-02-23: qty 1

## 2019-02-23 MED ORDER — EPINEPHRINE HCL 5 MG/250ML IV SOLN IN NS
0.0000 ug/min | INTRAVENOUS | Status: DC
  Filled 2019-02-23: qty 250

## 2019-02-23 MED ORDER — CHLORHEXIDINE GLUCONATE CLOTH 2 % EX PADS: 6.0000 | MEDICATED_PAD | Freq: Every day | CUTANEOUS | Status: DC

## 2019-02-23 MED ORDER — MIDAZOLAM HCL 2 MG/2ML IJ SOLN
INTRAMUSCULAR | Status: AC
  Filled 2019-02-23: qty 2

## 2019-02-23 MED ORDER — SODIUM CHLORIDE 0.9% FLUSH: 3.0000 mL | Freq: Two times a day (BID) | INTRAVENOUS | Status: DC

## 2019-02-23 MED ORDER — BISACODYL 5 MG PO TBEC
5.0000 mg | DELAYED_RELEASE_TABLET | Freq: Once | ORAL | Status: AC
  Administered 2019-02-23: 5 mg via ORAL
  Filled 2019-02-23: qty 1

## 2019-02-23 MED ORDER — HYDRALAZINE HCL 20 MG/ML IJ SOLN
INTRAMUSCULAR | Status: AC
  Filled 2019-02-23: qty 1

## 2019-02-23 MED ORDER — LIDOCAINE-PRILOCAINE 2.5-2.5 % EX CREA
1.0000 "application " | TOPICAL_CREAM | CUTANEOUS | Status: DC | PRN
  Filled 2019-02-23: qty 5

## 2019-02-23 MED ORDER — HEPARIN SODIUM (PORCINE) 1000 UNIT/ML DIALYSIS: 1000.0000 [IU] | INTRAMUSCULAR | Status: DC | PRN

## 2019-02-23 MED ORDER — SODIUM CHLORIDE 0.9 % IV SOLN: INTRAVENOUS | Status: DC

## 2019-02-23 MED ORDER — CALCIUM ACETATE (PHOS BINDER) 667 MG PO CAPS: 1334.0000 mg | ORAL_CAPSULE | ORAL | Status: DC | PRN

## 2019-02-23 MED ORDER — CHLORHEXIDINE GLUCONATE CLOTH 2 % EX PADS: 6.0000 | MEDICATED_PAD | Freq: Once | CUTANEOUS | Status: DC

## 2019-02-23 MED ORDER — PENTAFLUOROPROP-TETRAFLUOROETH EX AERO: 1.0000 "application " | INHALATION_SPRAY | CUTANEOUS | Status: DC | PRN

## 2019-02-23 MED ORDER — HEPARIN (PORCINE) IN NACL 1000-0.9 UT/500ML-% IV SOLN
INTRAVENOUS | Status: AC
  Filled 2019-02-23: qty 1000

## 2019-02-23 MED ORDER — METOPROLOL TARTRATE 12.5 MG HALF TABLET
12.5000 mg | ORAL_TABLET | Freq: Once | ORAL | Status: AC
  Administered 2019-02-24: 12.5 mg via ORAL
  Filled 2019-02-23: qty 1

## 2019-02-23 MED ORDER — FENTANYL CITRATE (PF) 100 MCG/2ML IJ SOLN
INTRAMUSCULAR | Status: DC | PRN
  Administered 2019-02-23: 25 ug via INTRAVENOUS

## 2019-02-23 MED ORDER — INSULIN REGULAR(HUMAN) IN NACL 100-0.9 UT/100ML-% IV SOLN
INTRAVENOUS | Status: AC
  Administered 2019-02-24: 1 [IU]/h via INTRAVENOUS
  Filled 2019-02-23: qty 100

## 2019-02-23 MED ORDER — MAGNESIUM SULFATE 2 GM/50ML IV SOLN
2.0000 g | Freq: Once | INTRAVENOUS | Status: AC
  Administered 2019-02-23: 2 g via INTRAVENOUS
  Filled 2019-02-23: qty 50

## 2019-02-23 MED ORDER — MAGNESIUM SULFATE 50 % IJ SOLN
40.0000 meq | INTRAMUSCULAR | Status: DC
  Filled 2019-02-23: qty 9.85

## 2019-02-23 MED ORDER — TRANEXAMIC ACID 1000 MG/10ML IV SOLN
1.5000 mg/kg/h | INTRAVENOUS | Status: AC
  Administered 2019-02-24: 09:00:00 1.5 mg/kg/h via INTRAVENOUS
  Filled 2019-02-23: qty 25

## 2019-02-23 SURGICAL SUPPLY — 9 items
CATH DXT MULTI JL4 JR4 ANG PIG (CATHETERS) ×1 IMPLANT
KIT HEART LEFT (KITS) ×2 IMPLANT
PACK CARDIAC CATHETERIZATION (CUSTOM PROCEDURE TRAY) ×2 IMPLANT
SHEATH PINNACLE 5F 10CM (SHEATH) ×1 IMPLANT
SHEATH PROBE COVER 6X72 (BAG) ×1 IMPLANT
TRANSDUCER W/STOPCOCK (MISCELLANEOUS) ×2 IMPLANT
TUBING CIL FLEX 10 FLL-RA (TUBING) ×2 IMPLANT
WIRE EMERALD 3MM-J .035X150CM (WIRE) ×3 IMPLANT
WIRE EMERALD ST .035X150CM (WIRE) ×1 IMPLANT

## 2019-02-23 NOTE — Consult Note (Addendum)
[facility] Renal Consultation Note    Indication for Consultation:  Management of ESRD/hemodialysis; anemia, hypertension/volume and secondary hyperparathyroidism  HPI: Robert Kem. is a 83 y.o. male with ESRD on HD TTS [place]. PMH significant for HTN, DM2, CAD s/p DES x 3, stroke x2 (2017 and 2018) s/p loop recorder, carotid artery disease s/p right CEA 2018.   He is admitted with new onset atrial fibrillation and elevated troponin. Presented to ED with weakness and palpitations. In the ED found to be in AFib with RVR. Converted to NSR with caredizem and metoprolol. CXR clear. K 3.8, BUN 34 Cr 6.23, Hgb 12.4, HS troponin [id number]  Seen and examined in room, bedside Echo being done. Cardiology evaluating with plans for cath today. Heparin gtt. Alert, comfortable, denies CP, SOB.   Dialyzes TTS. Last HD Sunday 12/27 d/t holiday schedule. He has been compliant with HD. Using AVF and usually reaching dry weight.   Past Medical History:  Diagnosis Date  . Allergy    year round  . Anemia   . Cancer (HCC)    PSA ELEVATED  . Carotid artery occlusion   . Cataract   . Depression   . Diabetes mellitus   . Dialysis patient ([place])   . Dyspnea   . ESRD (end stage renal disease) [facility])    [place]  . GERD (gastroesophageal reflux disease)   . Hearing deficit   . History of kidney stones   . Hyperlipidemia   . Hypertension   . Hypothyroidism   . Myocardial infarction ([place]) 2011  . Stroke (cerebrum) (HCC)    weakness in left hand  04/2015, 05/20/16  . Thyroid disease    hypothyroid   Past Surgical History:  Procedure Laterality Date  . A/V FISTULAGRAM Right 06/24/2017   Procedure: A/V FISTULAGRAM;  Surgeon: Serafina Mitchell, MD;  Location: [facility];  Service: Cardiovascular;  Laterality: Right;  rt  lower arm  . AV FISTULA PLACEMENT Right 07/25/2016   Procedure: ARTERIOVENOUS (AV) FISTULA CREATION-RIGHT;  Surgeon:  Serafina Mitchell, MD;  Location: [place];  Service: Vascular;  Laterality: Right;  . BASCILIC VEIN TRANSPOSITION Left 10/15/2017   Procedure: FIRST STAGE BASILIC VEIN TRANSPOSITION LEFT UPPER EXTREMITY;  Surgeon: Serafina Mitchell, MD;  Location: [place];  Service: Vascular;  Laterality: Left;  . BASCILIC VEIN TRANSPOSITION Left 12/10/2017   Procedure: SECOND STAGE BASILIC VEIN TRANSPOSITION LEFT ARM;  Surgeon: Serafina Mitchell, MD;  [facility];  Service: Vascular;  Laterality: Left;  . COLONOSCOPY    . CORONARY ANGIOPLASTY WITH STENT PLACEMENT  07/2009   3 stents  . ELBOW SURGERY     LEFT  . ENDARTERECTOMY Right 06/06/2016   Procedure: RIGHT CAROTID ENDARTERECTOMY WITH PATCH ANGIOPLASTY;  Surgeon: Serafina Mitchell, MD;  Location: [place];  Service: Vascular;  Laterality: Right;  . EP IMPLANTABLE DEVICE N/A 05/16/2015   Procedure: Loop Recorder Insertion;  Surgeon: Thompson Grayer, MD;  Location: [place] CV [facility];  Service: Cardiovascular;  Laterality: N/A;  . ESOPHAGOSCOPY WITH DILITATION    . FISTULA SUPERFICIALIZATION Right 12/05/2016   Procedure: Superficialization and Ligation of Branches OF RIGHT ARM RADIOCEPHALIC FISTULA;  Surgeon: Serafina Mitchell, MD;  Location: [place];  Service: Vascular;  Laterality: Right;  . NOSE SURGERY    . PERIPHERAL VASCULAR BALLOON ANGIOPLASTY  06/24/2017   Procedure: PERIPHERAL VASCULAR BALLOON ANGIOPLASTY;  Surgeon: Serafina Mitchell, MD;  Location: [facility];  Service: Cardiovascular;;  rt arm fistula   . stents    . TEE WITHOUT CARDIOVERSION N/A 05/16/2015   Procedure: TRANSESOPHAGEAL ECHOCARDIOGRAM (TEE);  Surgeon: Thayer Headings, MD;  Location: [facility] ENDOSCOPY;  Service: Cardiovascular;  Laterality: N/A;   Family History  Problem Relation Age of Onset  . Diabetes Mother   . Dementia Maternal Grandfather   . Heart attack Neg Hx    Social History:  reports that he has quit smoking. His smoking use included cigarettes. He has never used smokeless  tobacco. He reports current alcohol use of about 2.0 standard drinks of alcohol per week. He reports that he does not use drugs. Allergies  Allergen Reactions  . Lisinopril Other (See Comments)    "made me lose my voice"   Prior to Admission medications   Medication Sig Start Date End Date Taking? Authorizing Provider  ACCU-CHEK AVIVA PLUS test strip  12/09/17   [provider]  aspirin 81 MG tablet Take 1 tablet (81 mg total) by mouth daily. 12/26/17   Richardo Priest, MD  calcium acetate (PHOSLO) 667 MG tablet Take 1,334 mg by mouth See admin instructions. Take 2 tablets (1334 mg) by mouth daily with any meals or snacks. 10/14/17   [provider]  clopidogrel (PLAVIX) 75 MG tablet Take 1 tablet (75 mg total) by mouth daily. 05/24/16   Reyne Dumas, MD  ezetimibe (ZETIA) 10 MG tablet Take 1 tablet (10 mg total) by mouth daily. 05/25/16   Reyne Dumas, MD  fexofenadine (ALLEGRA) 180 MG tablet Take 180 mg by mouth daily.    [provider]  folic acid-vitamin b complex-vitamin c-selenium-zinc (DIALYVITE) 3 MG TABS tablet Take 1 tablet by mouth daily.    [provider]  hydrALAZINE (APRESOLINE) 50 MG tablet Take 50 mg by mouth See admin instructions. Sunday, Monday, Wednesday & Friday patient takes 1 tablet (50 mg) by mouth twice daily. Tuesday, Thursday, & Saturdays take 1 tablet at bedtime 06/13/15   [provider]  HYDROcodone-acetaminophen (NORCO/VICODIN) 5-325 MG tablet Take 1 tablet by mouth every 4 (four) hours as needed. 01/10/19   Maudie Flakes, MD  insulin degludec (TRESIBA FLEXTOUCH) 100 UNIT/ML SOPN FlexTouch Pen Inject 25 Units into the skin at bedtime.     [provider]  levothyroxine (SYNTHROID, LEVOTHROID) 50 MCG tablet Take 50 mcg by mouth daily before breakfast.  03/03/15   [provider]  lidocaine (LIDODERM) 5 % Place 1 patch onto the skin daily. Remove & Discard patch within 12 hours or as directed by MD 01/10/19    Maudie Flakes, MD  metoprolol succinate (TOPROL-XL) 50 MG 24 hr tablet TAKE 1 TABLET BY MOUTH IN  THE EVENING TUESDAY,  THURSDAY, SATURDAY AND 1  TABLET TWICE DAILY ALL  OTHER DAYS 01/18/19   Richardo Priest, MD  omega-3 acid ethyl esters (LOVAZA) 1 g capsule TAKE 2 CAPSULES (2 G TOTAL) BY MOUTH 2 (TWO) TIMES DAILY. 01/07/19   Richardo Priest, MD  pantoprazole (PROTONIX) 40 MG tablet Take 40 mg by mouth every evening.     [provider]  sertraline (ZOLOFT) 20 MG/ML concentrated solution Take 20 mg by mouth daily.  07/27/18   [provider]  simvastatin (ZOCOR) 40 MG tablet Take 1 tablet (40 mg total) by mouth daily at 6 PM. 05/17/15   Velvet Bathe, MD   Current Facility-Administered Medications  Medication Dose Route Frequency Provider Last Rate Last Admin  . 0.9 %  sodium chloride  infusion  250 mL Intravenous PRN Furth, Cadence H, PA-C      . 0.9 %  sodium chloride infusion   Intravenous Continuous Furth, Cadence H, PA-C      . acetaminophen (TYLENOL) tablet 650 mg  650 mg Oral Q4H PRN Etta Quill, DO      . aspirin EC tablet 81 mg  81 mg Oral Daily Alcario Drought, Jared M, DO      . calcium acetate (PHOSLO) capsule 1,334 mg  1,334 mg Oral TID WC Etta Quill, DO      . calcium acetate (PHOSLO) capsule 1,334 mg  1,334 mg Oral PRN Etta Quill, DO      . Chlorhexidine Gluconate Cloth 2 % PADS 6 each  6 each Topical Q0600 Lynnda Child, PA-C      . heparin ADULT infusion 100 units/mL (25000 units/230mL sodium chloride 0.45%)  1,400 Units/hr Intravenous Continuous Laren Everts, RPH 14 mL/hr at 02/05/2019 0651 1,400 Units/hr at 01/26/2019 0651  . insulin aspart (novoLOG) injection 0-9 Units  0-9 Units Subcutaneous Q4H Etta Quill, DO   2 Units at 02/02/2019 0415  . levothyroxine (SYNTHROID) tablet 50 mcg  50 mcg Oral Q0600 Etta Quill, DO   50 mcg at 02/11/2019 0547  . metoprolol succinate (TOPROL-XL) 24 hr tablet 50 mg  50 mg Oral BID Jennette Kettle M, DO    50 mg at 01/28/2019 1143  . ondansetron (ZOFRAN) injection 4 mg  4 mg Intravenous Q6H PRN Etta Quill, DO      . pantoprazole (PROTONIX) EC tablet 40 mg  40 mg Oral QPM Jennette Kettle M, DO   40 mg at 02/03/2019 2305  . simvastatin (ZOCOR) tablet 40 mg  40 mg Oral q1800 Etta Quill, DO      . sodium chloride flush (NS) 0.9 % injection 3 mL  3 mL Intravenous Q12H Furth, Cadence H, PA-C      . sodium chloride flush (NS) 0.9 % injection 3 mL  3 mL Intravenous PRN Furth, Cadence H, PA-C         ROS: As per HPI otherwise negative.  Physical Exam: Vitals:   02/15/2019 0041 02/24/2019 0106 02/10/2019 0343 02/16/2019 0807  BP: (!) 183/72  (!) 170/67 (!) 145/70  Pulse: 64  63 62  Resp:    16  Temp: 97.7 F (36.5 C)  98.2 F (36.8 C) 98.1 F (36.7 C)  TempSrc: Oral  Oral Oral  SpO2: 99%  98% 98%  Weight:  73.7 kg    Height:  5\' 8"  (1.727 m)       General: Alert, comfortable in bed, nad  Head: NCAT sclera not icteric  Neck: Supple. No JVD appreciated  Lungs: CTA bilaterally without wheezes, rales, or rhonchi. Breathing is unlabored. Heart: RRR with S1 S2; systolic murmur  Abdomen: soft NT + BS Lower extremities:without edema or ischemic changes, no open wounds  Neuro: A & O  X 3. Moves all extremities spontaneously. Psych:  Responds to questions appropriately with a normal affect. Dialysis Access: LUE AVF +bruit   Labs: Basic Metabolic Panel: Recent Labs  Lab 01/27/2019 1736  NA 142  K 3.8  CL 103  CO2 25  GLUCOSE 233*  BUN 34*  CREATININE 6.23*  CALCIUM 9.5   Liver Function Tests: Recent Labs  Lab 01/30/2019 1736  AST 18  ALT 19  ALKPHOS 87  BILITOT 0.4  PROT 6.5  ALBUMIN 3.5   No results for  input(s): LIPASE, AMYLASE in the last 168 hours. No results for input(s): AMMONIA in the last 168 hours. CBC: Recent Labs  Lab 02/24/2019 1736 02/25/2019 0549  WBC 5.5 5.0  NEUTROABS 3.8  --   HGB 12.2* 12.4*  HCT 38.9* 36.7*  MCV 99.0 99.7  PLT PLATELET CLUMPS NOTED ON  SMEAR, COUNT APPEARS DECREASED 123*   Cardiac Enzymes: No results for input(s): CKTOTAL, CKMB, CKMBINDEX, TROPONINI in the last 168 hours. CBG: Recent Labs  Lab 01/30/2019 2138 02/24/2019 0035 02/08/2019 0340 02/21/2019 0806 02/03/2019 1131  GLUCAP 199* 168* 151* 97 105*   Iron Studies: No results for input(s): IRON, TIBC, TRANSFERRIN, FERRITIN in the last 72 hours. Studies/Results: DG Chest Port 1 View  Result Date: 02/17/2019 CLINICAL DATA:  Weakness. Body aches. Chest pain. Shortness of breath. EXAM: PORTABLE CHEST 1 VIEW COMPARISON:  01/10/2019 FINDINGS: Numerous leads and wires project over the chest. Loop recorder. Apical lordotic positioning. Mild cardiomegaly. Atherosclerosis in the transverse aorta. No pleural effusion or pneumothorax. Lucency at the apices, suggesting emphysema. No lobar consolidation. No congestive failure. IMPRESSION: Cardiomegaly, without acute disease. Aortic Atherosclerosis (ICD10-I70.0). Electronically Signed   By: Abigail Miyamoto M.D.   On: 02/09/2019 18:13    Dialysis Orders:  [place] TTS 4h 400/800 EDW 73.5kg 3K/2.25Ca  UFP 4  AVF No heparin Hectorol 4 Venofer 50 Mircera 75 (last 12/15)   Assessment/Plan: 1. New onset atrial fibrillation/CAD s/p stents/Elevated troponin - Cardiology evaluating. Plans for cardiac cath today.  2. ESRD -  HD TTS. HD today after cath. Using added K+ bath  3. Hypertension/volume  -  BP/volume stable. UF to dry weight as tolerated  4. Anemia  - Hgb >12. No ESA needs  5. Metabolic bone disease -  Continue usual binders/Hectorol  6. Nutrition - Renal diet/vitamins   Lynnda Child PA-C [facility] Pager [phone] 02/23/2019, 2:19 PM   Pt seen, examined and agree w A/P as above.  [name] Splinter  MD 02/23/2019, 3:05 PM

## 2019-02-23 NOTE — Consult Note (Addendum)
[address]       [place] [address]             [phone]        Krystopher E Gurry Jr. [place] Record #[id number] Date of Birth: 12/31/1935  Referring: No ref. provider found Primary Care: Burnard Bunting, MD Primary Cardiologist:Brian Bettina Gavia, MD  Chief Complaint:    Chief Complaint  Patient presents with  . Tachycardia    History of Present Illness:     83 yo male admitted to the hospital with afib with RVR.  He had a significant troponin leak, and thus underwent a LHC which showed severe LM/3V.  He has ESRD, and is dialyzed TTHSat.  He denies any chest pain.    He has hx of loop recorder in place  Past Medical and Surgical History: Previous Chest Surgery: no Previous Chest Radiation: no Diabetes Mellitus: yes.  HbA1C 7.2 Creatinine: 6.2  Past Medical History:  Diagnosis Date  . Allergy    year round  . Anemia   . Cancer (HCC)    PSA ELEVATED  . Carotid artery occlusion   . Cataract   . Depression   . Diabetes mellitus   . Dialysis patient ([place])   . Dyspnea   . ESRD (end stage renal disease) [facility])    [place]  . GERD (gastroesophageal reflux disease)   . Hearing deficit   . History of kidney stones   . Hyperlipidemia   . Hypertension   . Hypothyroidism   . Myocardial infarction ([place]) 2011  . Stroke (cerebrum) (HCC)    weakness in left hand  04/2015, 05/20/16  . Thyroid disease    hypothyroid    Past Surgical History:  Procedure Laterality Date  . A/V FISTULAGRAM Right 06/24/2017   Procedure: A/V FISTULAGRAM;  Surgeon: Serafina Mitchell, MD;  Location: [place] CV LAB;  Service: Cardiovascular;  Laterality: Right;  rt  lower arm  . AV FISTULA PLACEMENT Right 07/25/2016   Procedure: ARTERIOVENOUS (AV) FISTULA CREATION-RIGHT;  Surgeon: Serafina Mitchell, MD;  Location: [place];  Service: Vascular;  Laterality: Right;  . BASCILIC VEIN TRANSPOSITION Left 10/15/2017   Procedure: FIRST STAGE BASILIC VEIN  TRANSPOSITION LEFT UPPER EXTREMITY;  Surgeon: Serafina Mitchell, MD;  Location: [place];  Service: Vascular;  Laterality: Left;  . BASCILIC VEIN TRANSPOSITION Left 12/10/2017   Procedure: SECOND STAGE BASILIC VEIN TRANSPOSITION LEFT ARM;  Surgeon: Serafina Mitchell, MD;  [facility];  Service: Vascular;  Laterality: Left;  . COLONOSCOPY    . CORONARY ANGIOPLASTY WITH STENT PLACEMENT  07/2009   3 stents  . ELBOW SURGERY     LEFT  . ENDARTERECTOMY Right 06/06/2016   Procedure: RIGHT CAROTID ENDARTERECTOMY WITH PATCH ANGIOPLASTY;  Surgeon: Serafina Mitchell, MD;  Location: [place];  Service: Vascular;  Laterality: Right;  . EP IMPLANTABLE DEVICE N/A 05/16/2015   Procedure: Loop Recorder Insertion;  Surgeon: Thompson Grayer, MD;  Location: [place] [facility];  Service: Cardiovascular;  Laterality: N/A;  . ESOPHAGOSCOPY WITH DILITATION    . FISTULA SUPERFICIALIZATION Right 12/05/2016   Procedure: Superficialization and Ligation of Branches OF RIGHT ARM RADIOCEPHALIC FISTULA;  Surgeon: Serafina Mitchell, MD;  Location: [place];  Service: Vascular;  Laterality: Right;  . NOSE SURGERY    . PERIPHERAL VASCULAR BALLOON ANGIOPLASTY  06/24/2017   Procedure: PERIPHERAL VASCULAR BALLOON ANGIOPLASTY;  Surgeon: Serafina Mitchell, MD;  Location: [place] CV LAB;  Service: Cardiovascular;;  rt arm fistula   . stents    . TEE WITHOUT CARDIOVERSION N/A 05/16/2015   Procedure: TRANSESOPHAGEAL ECHOCARDIOGRAM (TEE);  Surgeon: Thayer Headings, MD;  Location: [facility] ENDOSCOPY;  Service: Cardiovascular;  Laterality: N/A;    Social History: Support: daughter is main support  Social History   Tobacco Use  Smoking Status Former Smoker  . Types: Cigarettes  Smokeless Tobacco Never Used  Tobacco Comment   quit age 66    Social History   Substance and Sexual Activity  Alcohol Use Yes  . Alcohol/week: 2.0 standard drinks  . Types: 2 Glasses of wine per week   Comment: rarely     Allergies  Allergen Reactions  .  Lisinopril Other (See Comments)    "made me lose my voice"    Medications: Asprin: yes Statin: yes Beta Blocker: yes Ace Inhibitor: no Anti-Coagulation: Plavix.  Last dose on 12/28  Current Facility-Administered Medications  Medication Dose Route Frequency Provider Last Rate Last Admin  . 0.9 %  sodium chloride infusion  250 mL Intravenous PRN Furth, Cadence H, PA-C      . 0.9 %  sodium chloride infusion   Intravenous Continuous Furth, Cadence H, PA-C      . [MAR Hold] acetaminophen (TYLENOL) tablet 650 mg  650 mg Oral Q4H PRN Etta Quill, DO      . [MAR Hold] aspirin EC tablet 81 mg  81 mg Oral Daily Jennette Kettle M, DO      . [MAR Hold] calcium acetate (PHOSLO) capsule 1,334 mg  1,334 mg Oral TID WC Etta Quill, DO      . [MAR Hold] calcium acetate (PHOSLO) capsule 1,334 mg  1,334 mg Oral PRN Etta Quill, DO      . [MAR Hold] Chlorhexidine Gluconate Cloth 2 % PADS 6 each  6 each Topical Q0600 Lynnda Child, PA-C      . fentaNYL (SUBLIMAZE) injection    PRN Leonie Man, MD   25 mcg at 02/15/2019 1553  . Heparin (Porcine) in NaCl 1000-0.9 UT/500ML-% SOLN    PRN Leonie Man, MD   500 mL at 02/05/2019 1549  . heparin ADULT infusion 100 units/mL (25000 units/229mL sodium chloride 0.45%)  1,400 Units/hr Intravenous Continuous Laren Everts, RPH 14 mL/hr at 02/02/2019 0651 1,400 Units/hr at 02/06/2019 0651  . hydrALAZINE (APRESOLINE) injection    PRN Leonie Man, MD   10 mg at 02/14/2019 1615  . [MAR Hold] insulin aspart (novoLOG) injection 0-9 Units  0-9 Units Subcutaneous Q4H Etta Quill, DO   2 Units at 01/27/2019 0415  . iohexol (OMNIPAQUE) 350 MG/ML injection    PRN Leonie Man, MD   65 mL at 02/25/2019 1649  . [MAR Hold] levothyroxine (SYNTHROID) tablet 50 mcg  50 mcg Oral Q0600 Etta Quill, DO   50 mcg at 02/07/2019 0547  . lidocaine (PF) (XYLOCAINE) 1 % injection    PRN Leonie Man, MD   15 mL at 02/06/2019 1553  . [MAR Hold] metoprolol  succinate (TOPROL-XL) 24 hr tablet 50 mg  50 mg Oral BID Jennette Kettle M, DO   50 mg at 02/14/2019 1143  . midazolam (VERSED) injection    PRN Leonie Man, MD   1 mg at 01/26/2019 1553  . [MAR Hold] ondansetron (ZOFRAN) injection 4 mg  4 mg Intravenous Q6H PRN Etta Quill, DO      . [MAR Hold] pantoprazole (PROTONIX) EC  tablet 40 mg  40 mg Oral QPM Jennette Kettle M, DO   40 mg at 02/21/2019 2305  . [MAR Hold] simvastatin (ZOCOR) tablet 40 mg  40 mg Oral q1800 Etta Quill, DO      . [facility] Hold] sodium chloride flush (NS) 0.9 % injection 3 mL  3 mL Intravenous Q12H Furth, Cadence H, PA-C      . sodium chloride flush (NS) 0.9 % injection 3 mL  3 mL Intravenous PRN Furth, Cadence H, PA-C        Medications Prior to Admission  Medication Sig Dispense Refill Last Dose  . ACCU-CHEK AVIVA PLUS test strip      . aspirin 81 MG tablet Take 1 tablet (81 mg total) by mouth daily. 30 tablet 0   . calcium acetate (PHOSLO) 667 MG tablet Take 1,334 mg by mouth See admin instructions. Take 2 tablets (1334 mg) by mouth daily with any meals or snacks.  5   . clopidogrel (PLAVIX) 75 MG tablet Take 1 tablet (75 mg total) by mouth daily. 30 tablet 1   . ezetimibe (ZETIA) 10 MG tablet Take 1 tablet (10 mg total) by mouth daily. 30 tablet 1   . fexofenadine (ALLEGRA) 180 MG tablet Take 180 mg by mouth daily.     . folic acid-vitamin b complex-vitamin c-selenium-zinc (DIALYVITE) 3 MG TABS tablet Take 1 tablet by mouth daily.     . hydrALAZINE (APRESOLINE) 50 MG tablet Take 50 mg by mouth See admin instructions. Sunday, Monday, Wednesday & Friday patient takes 1 tablet (50 mg) by mouth twice daily. Tuesday, Thursday, & Saturdays take 1 tablet at bedtime     . HYDROcodone-acetaminophen (NORCO/VICODIN) 5-325 MG tablet Take 1 tablet by mouth every 4 (four) hours as needed. 6 tablet 0   . insulin degludec (TRESIBA FLEXTOUCH) 100 UNIT/ML SOPN FlexTouch Pen Inject 25 Units into the skin at bedtime.      [name]  levothyroxine (SYNTHROID, LEVOTHROID) 50 MCG tablet Take 50 mcg by mouth daily before breakfast.      . lidocaine (LIDODERM) 5 % Place 1 patch onto the skin daily. Remove & Discard patch within 12 hours or as directed by MD 5 patch 0   . metoprolol succinate (TOPROL-XL) 50 MG 24 hr tablet TAKE 1 TABLET BY MOUTH IN  THE EVENING TUESDAY,  THURSDAY, SATURDAY AND 1  TABLET TWICE DAILY ALL  OTHER DAYS 90 tablet 5   . omega-3 acid ethyl esters (LOVAZA) 1 g capsule TAKE 2 CAPSULES (2 G TOTAL) BY MOUTH 2 (TWO) TIMES DAILY. 360 capsule 0   . pantoprazole (PROTONIX) 40 MG tablet Take 40 mg by mouth every evening.      . sertraline (ZOLOFT) 20 MG/ML concentrated solution Take 20 mg by mouth daily.      . simvastatin (ZOCOR) 40 MG tablet Take 1 tablet (40 mg total) by mouth daily at 6 PM. 30 tablet 0     Family History  Problem Relation Age of Onset  . Diabetes Mother   . Dementia Maternal Grandfather   . Heart attack Neg Hx      Review of Systems:   Review of Systems  Constitutional: Negative for fever and malaise/fatigue.  HENT: Negative.   Respiratory: Negative for shortness of breath.   Cardiovascular: Negative for chest pain and leg swelling.      Physical Exam: BP (!) 147/52   Pulse 63   Temp 98.1 F (36.7 C) (Oral)   Resp 20  Ht 5\' 8"  (1.727 m)   Wt 73.7 kg   SpO2 97%   BMI 24.71 kg/m  Physical Exam  Constitutional: No distress.  HENT:  Head: Normocephalic and atraumatic.  Eyes: EOM are normal.  Neck: No tracheal deviation present.  Cardiovascular: Normal rate.  Pulmonary/Chest: Effort normal. No respiratory distress.  Abdominal: Soft. He exhibits no distension.  Skin: Skin is warm and dry.      Diagnostic Studies & Laboratory data:    Left Heart Catherization: LM/3V CAD.  Good targets on PDA, OM1, Diag, and LAD Echo: Moderate aortic stenosis, calcified valve.  Normal RV and LV function   I have independently reviewed the above radiologic studies and discussed  with the patient   Recent Lab Findings: Lab Results  Component Value Date   WBC 5.0 02/13/2019   HGB 12.4 (L) 01/30/2019   HCT 36.7 (L) 02/25/2019   PLT 123 (L) 01/27/2019   GLUCOSE 233 (H) 02/13/2019   CHOL 194 10/05/2018   TRIG 577 (HH) 10/05/2018   HDL 28 (L) 10/05/2018   [place] Comment 10/05/2018   ALT 19 02/16/2019   AST 18 02/06/2019   NA 142 02/25/2019   K 3.8 02/19/2019   CL 103 02/11/2019   CREATININE 6.23 (H) 02/16/2019   BUN 34 (H) 02/08/2019   CO2 25 02/08/2019   TSH 3.084 02/10/2019   INR 1.0 02/10/2019   HGBA1C 7.2 (H) 02/14/2019      Assessment / Plan:   83 yo male with LM/3V CAD, moderated aortic stenosis, ESRD on HD, who presents with afib/RVR and NSTEMI.  He has acceptable targets for surgical bypass.  Given his age, and co-mobidities, I have recommended that he undergo CABG with atriclip placement, and surveillance for his aortic valve.  If he requires valve replacement in the future, he would be a candidate for TAVR.  We have ordered a P2Y12, but given the degree of his disease, he may require surgery prior to washout.  He is tentatively scheduled for tomorrow morning, with plts on hold.   I  spent 40 minutes counseling the patient face to face.   [name] 02/18/2019 5:34 PM

## 2019-02-23 NOTE — Anesthesia Preprocedure Evaluation (Signed)
Anesthesia Evaluation  Patient identified by MRN, date of birth, ID band Patient awake    Reviewed: Allergy & Precautions, NPO status , Patient's Chart, lab work & pertinent test results  Airway Mallampati: II  TM Distance: >3 FB Neck ROM: Full    Dental  (+) Teeth Intact, Dental Advisory Given   Pulmonary shortness of breath, former smoker,    breath sounds clear to auscultation       Cardiovascular hypertension, Pt. on medications and Pt. on home beta blockers + CAD, + Past MI, + Cardiac Stents and +CHF   Rhythm:Regular Rate:Normal     Neuro/Psych PSYCHIATRIC DISORDERS Depression  Neuromuscular disease CVA    GI/Hepatic GERD  Medicated,  Endo/Other  diabetes, Type 2, Insulin DependentHypothyroidism   Renal/GU ESRF and DialysisRenal disease     Musculoskeletal   Abdominal Normal abdominal exam  (+)   Peds  Hematology  (+) anemia ,   Anesthesia Other Findings   Reproductive/Obstetrics                             Lab Results  Component Value Date   WBC 5.0 01/27/2019   HGB 12.4 (L) 02/08/2019   HCT 36.7 (L) 02/13/2019   MCV 99.7 02/05/2019   PLT 123 (L) 02/08/2019   Lab Results  Component Value Date   CREATININE 6.23 (H) 02/03/2019   BUN 34 (H) 02/05/2019   NA 142 02/08/2019   K 3.8 02/20/2019   CL 103 02/09/2019   CO2 25 02/24/2019     Anesthesia Physical  Anesthesia Plan  ASA: IV  Anesthesia Plan: General   Post-op Pain Management:    Induction: Intravenous  PONV Risk Score and Plan: 3 and Midazolam and Treatment may vary due to age or medical condition  Airway Management Planned: Oral ETT  Additional Equipment: Arterial line, CVP, PA Cath, TEE and Ultrasound Guidance Line Placement  Intra-op Plan: Utilization Of Total Body Hypothermia per surgeon request  Post-operative Plan: Post-operative intubation/ventilation  Informed Consent: I have reviewed the  patients History and Physical, chart, labs and discussed the procedure including the risks, benefits and alternatives for the proposed anesthesia with the patient or authorized representative who has indicated his/her understanding and acceptance.     Dental advisory given  Plan Discussed with: CRNA  Anesthesia Plan Comments:         Anesthesia Quick Evaluation

## 2019-02-23 NOTE — Progress Notes (Addendum)
PROGRESS NOTE    Robert [name].  QPY:[id number] DOB: [birth year] DOA: 02/07/2019 PCP: Burnard Bunting, MD   Brief Narrative:  HPI: Robert Leffel. is a 83 y.o. male with medical history significant of ESRD on TTS dialysis, HTN, DM2, CAD s/p 3 stents, stroke s/p CEA.  Patient has loop recorder in place after stroke which never detected A.Fib until the battery finally died (left in place because he didn't want a surgery to remove it).  Patient states this morning when he woke up he was his normal self. He went to the ophthalmologist and had his eyes evaluated and got home and still felt well then around 2:00 he just started feeling generally bad. He reports feeling a little bit achy all over and feeling short of breath. He denied any chest pain abdominal pain, nausea, vomiting or diarrhea. He has not had any recent cough, fever or lower extremity swelling.   He does continue on Plavix and aspirin but states he did not take any of his medications today and is not sure if he took them yesterday either because he sometimes forgets.  EMS called, A.Fib RVR with rate 180, given total of 35mg  cardizem IV and HR came down to 140.   ED Course: In ED given 5 of metoprolol and he converted out of A.Fib and into sinus.  Continues to deny chest pain.  Trop however, elevated in ED from 81 to 856 on repeat.  Cards consulted and recd overnight obs admit, 2d echo in AM.  Assessment & Plan:   Principal Problem:   New onset a-fib ([place]) Active Problems:   Diabetes mellitus, type II, insulin dependent ([place])   Hypertensive heart disease with heart failure ([place])   CKD (chronic kidney disease) stage V requiring chronic dialysis ([place])   CAD (coronary artery disease)   Atrial fibrillation with RVR (H[facility])   NSTEMI (non-ST elevated myocardial infarction) ([place])   Tachycardia/Non-ST elevation MI: Significant jump in troponin from 81> 856> 4043> 4270> 4018.  On heparin drip.  Seen by  cardiology.  Plan for cardiac catheter today.  Management per cardiology.  New onset atrial fibrillation: Patient was brought by EMS found to be in afib RVR, rates reportedly up to 180s. Converted to NSR in the ED after metoprolol 5 mg.  TSH pending.  Echo pending.  Echo from 2018 shows normal ejection fraction. CHADSVASC = 8(CHF, HTN, DM2, Age, stroke, vascular dz).  Will need lifelong anticoagulation.  Cardiology on board.  Defer to them.  Chronic diastolic congestive heart failure: New echo pending.  Euvolemic.  Continue metoprolol.  Essential hypertension: Controlled.  Continue metoprolol.   ESRD: On TTS HD schedule.  Nephrology consulted this morning.  Type 2 diabetes mellitus: Blood sugar very well controlled.  Continue SSI.  Hemoglobin A1c 7.2.  DVT prophylaxis: Heparin drip Code Status: Full code Family Communication:  None present at bedside.  Plan of care discussed with patient in length and he verbalized understanding and agreed with it. Disposition Plan: Discharge when cleared by cardiology  Estimated body mass index is 24.71 kg/m as calculated from the following:   Height as of this encounter: 5\' 8"  (1.727 m).   Weight as of this encounter: 73.7 kg.      Nutritional status:               Consultants:   Cardiology  Procedures:   Cardiac cath today  Antimicrobials:   None   Subjective: Seen and examined.  No complaints.  No shortness of breath or chest pain.  Objective: Vitals:   01/30/2019 0041 02/03/2019 0106 02/18/2019 0343 02/07/2019 0807  BP: (!) 183/72  (!) 170/67 (!) 145/70  Pulse: 64  63 62  Resp:    16  Temp: 97.7 F (36.5 C)  98.2 F (36.8 C) 98.1 F (36.7 C)  TempSrc: Oral  Oral Oral  SpO2: 99%  98% 98%  Weight:  73.7 kg    Height:  5\' 8"  (1.727 m)     No intake or output data in the 24 hours ending 02/04/2019 1430 Filed Weights   02/02/2019 2100 02/16/2019 0106  Weight: 74.8 kg 73.7 kg    Examination:  General exam: Appears calm and  comfortable  Respiratory system: Clear to auscultation. Respiratory effort normal. Cardiovascular system: S1 & S2 heard, RRR. No JVD, murmurs, rubs, gallops or clicks. No pedal edema. Gastrointestinal system: Abdomen is nondistended, soft and nontender. No organomegaly or masses felt. Normal bowel sounds heard. Central nervous system: Alert and oriented. No focal neurological deficits. Extremities: Symmetric 5 x 5 power. Skin: No rashes, lesions or ulcers Psychiatry: Judgement and insight appear poor. Mood & affect flat.   Data Reviewed: I have personally reviewed following labs and imaging studies  CBC: Recent Labs  Lab 02/09/2019 1736 01/30/2019 0549  WBC 5.5 5.0  NEUTROABS 3.8  --   HGB 12.2* 12.4*  HCT 38.9* 36.7*  MCV 99.0 99.7  PLT PLATELET CLUMPS NOTED ON SMEAR, COUNT APPEARS DECREASED 161*   Basic Metabolic Panel: Recent Labs  Lab 02/16/2019 1736  NA 142  K 3.8  CL 103  CO2 25  GLUCOSE 233*  BUN 34*  CREATININE 6.23*  CALCIUM 9.5   GFR: Estimated Creatinine Clearance: 9 mL/min (A) (by C-G formula based on SCr of 6.23 mg/dL (H)). Liver Function Tests: Recent Labs  Lab 02/08/2019 1736  AST 18  ALT 19  ALKPHOS 87  BILITOT 0.4  PROT 6.5  ALBUMIN 3.5   No results for input(s): LIPASE, AMYLASE in the last 168 hours. No results for input(s): AMMONIA in the last 168 hours. Coagulation Profile: Recent Labs  Lab 02/17/2019 1736  INR 1.0   Cardiac Enzymes: No results for input(s): CKTOTAL, CKMB, CKMBINDEX, TROPONINI in the last 168 hours. BNP (last 3 results) No results for input(s): PROBNP in the last 8760 hours. HbA1C: Recent Labs    02/09/2019 2100  HGBA1C 7.2*   CBG: Recent Labs  Lab 02/21/2019 2138 02/06/2019 0035 02/22/2019 0340 02/18/2019 0806 02/01/2019 1131  GLUCAP 199* 168* 151* 97 105*   Lipid Profile: No results for input(s): CHOL, HDL, LDLCALC, TRIG, CHOLHDL, LDLDIRECT in the last 72 hours. Thyroid Function Tests: No results for input(s): TSH,  T4TOTAL, FREET4, T3FREE, THYROIDAB in the last 72 hours. Anemia Panel: No results for input(s): VITAMINB12, FOLATE, FERRITIN, TIBC, IRON, RETICCTPCT in the last 72 hours. Sepsis Labs: No results for input(s): PROCALCITON, LATICACIDVEN in the last 168 hours.  Recent Results (from the past 240 hour(s))  SARS CORONAVIRUS 2 (TAT 6-24 HRS) Nasopharyngeal Nasopharyngeal Swab     Status: None   Collection Time: 02/10/2019  5:36 PM   Specimen: Nasopharyngeal Swab  Result Value Ref Range Status   SARS Coronavirus 2 NEGATIVE NEGATIVE Final    Comment: (NOTE) SARS-CoV-2 target nucleic acids are NOT DETECTED. The SARS-CoV-2 RNA is generally detectable in upper and lower respiratory specimens during the acute phase of infection. Negative results do not preclude SARS-CoV-2 infection, do not rule out co-infections with other pathogens, and  should not be used as the sole basis for treatment or other patient management decisions. Negative results must be combined with clinical observations, patient history, and epidemiological information. The expected result is Negative. Fact Sheet for Patients: SugarRoll.be Fact Sheet for Healthcare Providers: https://www.woods-mathews.com/ This test is not yet approved or cleared by the [place] FDA and  has been authorized for detection and/or diagnosis of SARS-CoV-2 by FDA under an Emergency Use Authorization (EUA). This EUA will remain  in effect (meaning this test can be used) for the duration of the COVID-19 declaration under Section 56 4(b)(1) of the Act, 21 U.S.C. section 360bbb-3(b)(1), unless the authorization is terminated or revoked sooner. Performed at [facility], [place] [address], [place], [place] [address]       Radiology Studies: DG Chest Port 1 View  Result Date: 02/16/2019 CLINICAL DATA:  Weakness. Body aches. Chest pain. Shortness of breath. EXAM: PORTABLE CHEST 1 VIEW COMPARISON:   01/10/2019 FINDINGS: Numerous leads and wires project over the chest. Loop recorder. Apical lordotic positioning. Mild cardiomegaly. Atherosclerosis in the transverse aorta. No pleural effusion or pneumothorax. Lucency at the apices, suggesting emphysema. No lobar consolidation. No congestive failure. IMPRESSION: Cardiomegaly, without acute disease. Aortic Atherosclerosis (ICD10-I70.0). Electronically Signed   By: Abigail Miyamoto M.D.   On: 02/22/2019 18:13    Scheduled Meds: . aspirin EC  81 mg Oral Daily  . calcium acetate  1,334 mg Oral TID WC  . Chlorhexidine Gluconate Cloth  6 each Topical Q0600  . insulin aspart  0-9 Units Subcutaneous Q4H  . levothyroxine  50 mcg Oral Q0600  . metoprolol succinate  50 mg Oral BID  . pantoprazole  40 mg Oral QPM  . simvastatin  40 mg Oral q1800  . sodium chloride flush  3 mL Intravenous Q12H   Continuous Infusions: . sodium chloride    . sodium chloride    . heparin 1,400 Units/hr (02/18/2019 0651)     LOS: 1 day   Time spent: 35 minutes   Darliss Cheney, MD [facility]  02/23/2019, 2:30 PM   To contact the attending provider between 7A-7P or the covering provider during after hours 7P-7A, please log into the web site [email or web address] and use password [email or web address].

## 2019-02-23 NOTE — Progress Notes (Signed)
[place] for heparin Indication: atrial fibrillation  Allergies  Allergen Reactions  . Lisinopril Other (See Comments)    "made me lose my voice"    Patient Measurements: Height: 5\' 8"  (172.7 cm) Weight: 162 lb 8 oz (73.7 kg) IBW/kg (Calculated) : 68.4 Heparin Dosing Weight: 74.8kg  Vital Signs: Temp: 98.1 F (36.7 C) (12/29 0807) Temp Source: Oral (12/29 0807) BP: 145/70 (12/29 0807) Pulse Rate: 62 (12/29 0807)  Labs: Recent Labs    01/26/2019 1736 02/16/2019 2200 01/30/2019 0124 02/13/2019 0549 02/21/2019 0955 01/31/2019 1400  HGB 12.2*  --   --  12.4*  --   --   HCT 38.9*  --   --  36.7*  --   --   PLT PLATELET CLUMPS NOTED ON SMEAR, COUNT APPEARS DECREASED  --   --  123*  --   --   APTT 23*  --   --   --   --   --   LABPROT 13.2  --   --   --   --   --   INR 1.0  --   --   --   --   --   HEPARINUNFRC  --   --   --  0.13*  --  0.99*  CREATININE 6.23*  --   --   --   --   --   TROPONINIHS 81* 4,043* 4,270*  --  4,018*  --     Estimated Creatinine Clearance: 9 mL/min (A) (by C-G formula based on SCr of 6.23 mg/dL (H)).   Medical History: Past Medical History:  Diagnosis Date  . Allergy    year round  . Anemia   . Cancer (HCC)    PSA ELEVATED  . Carotid artery occlusion   . Cataract   . Depression   . Diabetes mellitus   . Dialysis patient ([place])   . Dyspnea   . ESRD (end stage renal disease) [facility])    [place]  . GERD (gastroesophageal reflux disease)   . Hearing deficit   . History of kidney stones   . Hyperlipidemia   . Hypertension   . Hypothyroidism   . Myocardial infarction ([place]) 2011  . Stroke (cerebrum) (HCC)    weakness in left hand  04/2015, 05/20/16  . Thyroid disease    hypothyroid    Medications:  Infusions:  . sodium chloride    . sodium chloride    . heparin 1,400 Units/hr (02/11/2019 4944)    Assessment: 51 yom presented to the ED with tachycardia. Found to be in afib and now starting  IV heparin. Pt is not on anticoagulation PTA. Hgb is slightly low and platelets are clumped (typically 120-130's in the past). No bleeding noted.   Heparin level this afternoon elevated at 0.99, then turned off for cath lab.  Goal of Therapy:  Heparin level 0.3-0.7 units/ml Monitor platelets by anticoagulation protocol: Yes   Plan:  F/u plans for heparin after cath lab.  Marguerite Olea, [facility] Clinical Pharmacist Phone [phone]  02/23/2019 3:20 PM

## 2019-02-23 NOTE — Progress Notes (Signed)
ANTICOAGULATION CONSULT NOTE - Follow Up Consult  Pharmacy Consult for heparin Indication: atrial fibrillation  Labs: Recent Labs    02/21/2019 1736 02/20/2019 1922 02/24/2019 2200 01/26/2019 0124 02/03/2019 0549  HGB 12.2*  --   --   --  12.4*  HCT 38.9*  --   --   --  36.7*  PLT PLATELET CLUMPS NOTED ON SMEAR, COUNT APPEARS DECREASED  --   --   --  123*  APTT 23*  --   --   --   --   LABPROT 13.2  --   --   --   --   INR 1.0  --   --   --   --   HEPARINUNFRC  --   --   --   --  0.13*  CREATININE 6.23*  --   --   --   --   TROPONINIHS 81* 856* 4,043* 4,270*  --     Assessment: 83yo male subtherapeutic on heparin with initial dosing for Afib; no gtt issues or signs of bleeding per RN.  Goal of Therapy:  Heparin level 0.3-0.7 units/ml   Plan:  Will rebolus with heparin 2000 units and increase heparin gtt by 4 units/kg/hr to 1400 units/hr and check level in 8 hours.    Wynona Neat, PharmD, BCPS  02/16/2019,6:49 AM

## 2019-02-23 NOTE — Progress Notes (Signed)
[place] for heparin Indication: atrial fibrillation  Allergies  Allergen Reactions  . Lisinopril Other (See Comments)    "made me lose my voice"    Patient Measurements: Height: 5\' 8"  (172.7 cm) Weight: 162 lb 8 oz (73.7 kg) IBW/kg (Calculated) : 68.4 Heparin Dosing Weight: 74.8kg  Vital Signs: Temp: 98.1 F (36.7 C) (12/29 0807) Temp Source: Oral (12/29 0807) BP: 147/52 (12/29 1710) Pulse Rate: 63 (12/29 1710)  Labs: Recent Labs    02/21/2019 1736 02/04/2019 2200 02/02/2019 0124 02/06/2019 0549 02/20/2019 0955 02/08/2019 1400  HGB 12.2*  --   --  12.4*  --   --   HCT 38.9*  --   --  36.7*  --   --   PLT PLATELET CLUMPS NOTED ON SMEAR, COUNT APPEARS DECREASED  --   --  123*  --   --   APTT 23*  --   --   --   --   --   LABPROT 13.2  --   --   --   --   --   INR 1.0  --   --   --   --   --   HEPARINUNFRC  --   --   --  0.13*  --  0.99*  CREATININE 6.23*  --   --   --   --   --   TROPONINIHS 81* 4,043* 4,270*  --  4,018*  --     Estimated Creatinine Clearance: 9 mL/min (A) (by C-G formula based on SCr of 6.23 mg/dL (H)).   Medical History: Past Medical History:  Diagnosis Date  . Allergy    year round  . Anemia   . Cancer (HCC)    PSA ELEVATED  . Carotid artery occlusion   . Cataract   . Depression   . Diabetes mellitus   . Dialysis patient ([place])   . Dyspnea   . ESRD (end stage renal disease) [facility])    [place]  . GERD (gastroesophageal reflux disease)   . Hearing deficit   . History of kidney stones   . Hyperlipidemia   . Hypertension   . Hypothyroidism   . Myocardial infarction ([place]) 2011  . Stroke (cerebrum) (HCC)    weakness in left hand  04/2015, 05/20/16  . Thyroid disease    hypothyroid    Medications:  Infusions:  . sodium chloride    . sodium chloride    . heparin 1,400 Units/hr (01/28/2019 3790)    Assessment: 53 yom presented to the ED with tachycardia. Found to be in afib and now starting  IV heparin. Pt is not on anticoagulation PTA. Hgb is slightly low and platelets are clumped (typically 120-130's in the past). No bleeding noted.   Heparin level this afternoon elevated at 0.99, then turned off for cath lab.  Patient now s/p cath, found to have multivessel CAD. Surgery has been consulted. If deemed surgical candidate will need to have plavix washout (last dose 12/27). Heparin ordered to restart tonight.   Goal of Therapy:  Heparin level 0.3-0.7 units/ml Monitor platelets by anticoagulation protocol: Yes   Plan:  Restart heparin at lower rate - 1200 units/hr at 00:30 in am Heparin level 8 hours after restart then daily  Erin Hearing PharmD., BCPS Clinical Pharmacist 02/20/2019 5:35 PM

## 2019-02-23 NOTE — Interval H&P Note (Signed)
History and Physical Interval Note:  02/21/2019 3:43 PM  Robert [name].  has presented today for surgery, with the diagnosis of elevated trop with Afib & RVR.   The various methods of treatment have been discussed with the patient and family. After consideration of risks, benefits and other options for treatment, the patient has consented to  Procedure(s): LEFT HEART CATH AND CORONARY ANGIOGRAPHY (N/A)  PERCUTANEOUS CORONARY INTERVENTION  as a surgical intervention.  The patient's history has been reviewed, patient examined, no change in status, stable for surgery.  I have reviewed the patient's chart and labs.  Questions were answered to the patient's satisfaction.    Cath Lab Visit (complete for each Cath Lab visit)  Clinical Evaluation Leading to the Procedure:   ACS: Yes.    Non-ACS:    Anginal Classification: CCS III  Anti-ischemic medical therapy: Minimal Therapy (1 class of medications)  Non-Invasive Test Results: No non-invasive testing performed  Prior CABG: No previous CABG   Glenetta Hew

## 2019-02-23 NOTE — H&P (View-Only) (Signed)
Cardiology Consultation:   Patient ID: Robert [name]. MRN: [id number]; DOB: [birth year]  Admit date: 02/18/2019 Date of Consult: 02/05/2019  Primary Care Provider: Burnard Bunting, MD Primary Cardiologist: Robert More, MD  Primary Electrophysiologist:  None    Patient Profile:   Robert Mackie. is a 83 y.o. male with a hx of ESRD on HD, HTN, DM2, CAD s/p DES x 3, stroke x2 (2017 and 2018) s/p loop recorder, carotid artery disease s/p right CEA 2018 who is being seen today for the evaluation of new onset afib at the request of Dr. Alcario [name].  History of Present Illness:   Robert [name] is followed by Dr. Bettina [name]. The patient has CAD s/p DES x 3, last PCI stent in 2011 to Robert [name] (per chart review, no cath note in epic). He had a stroke in 2018 and had a loop recorder implanted. Echo in 2018 showed EF 55-60%, G1DD, mild LVH, severe MAC, trace MR and TR.  His last Loop recorder check was 10/19/18 which did not show any arrhythmias. He had a televisit with Robert [name] 11/04/18 who explained loop recorder at RRT and patient preferred to leave it in and would call for removal in the future.   The patient presented to the ED 02/07/2019 for generalized weakness and tachycardia. The patient was in his normal state of health that morning. He woke up and went to the ophthalmologist for a check-up and went home. At around 2 PM he started feeling generally bad and lightheadedness. He felt weak all over and shortness of breath. No chest pain. No fever, cough, or recent illness. No lower leg edema or orthopnea. He called EMS and they reported Afib RVR rate 180. He was given cardizem IV and HR improved to 140 bpm.   In the ED BP was 119/104, afebrile, RR 18, 98% O2. Labs showed potassium 3.8, glucose 233, calcium 9.5. WBC 5.5, Hgb 12.2. EKG showed Afib RVR, 159 bpm with likey rate related changes. CXR significant for cardiomegaly with no acute changes. HS troponin 81 and the second was 856. He was given 5 mg of  metoprolol and converted to NSR. Follow-up EKG showed NST 70 bpm with PAC, and minimal ST depression V5 and II.  The patient was admitted for further observation.     Heart Pathway Score:     Past Medical History:  Diagnosis Date   Allergy    year round   Anemia    Cancer ([name])    PSA ELEVATED   Carotid artery occlusion    Cataract    Depression    Diabetes mellitus    Dialysis patient ([place])    Dyspnea    ESRD (end stage renal disease) ([place])    [place]   GERD (gastroesophageal reflux disease)    Hearing deficit    History of kidney stones    Hyperlipidemia    Hypertension    Hypothyroidism    Myocardial infarction Robert [name]) 2011   Stroke (cerebrum) ([facility])    weakness in left hand  04/2015, 05/20/16   Thyroid disease    hypothyroid    Past Surgical History:  Procedure Laterality Date   A/V FISTULAGRAM Right 06/24/2017   Procedure: A/V FISTULAGRAM;  Surgeon: Serafina Mitchell, MD;  Location: [facility];  Service: Cardiovascular;  Laterality: Right;  rt  lower arm   AV FISTULA PLACEMENT Right 07/25/2016   Procedure: ARTERIOVENOUS (AV) FISTULA CREATION-RIGHT;  Surgeon: Serafina Mitchell, MD;  Location: [facility];  Service: Vascular;  Laterality: Right;   BASCILIC VEIN TRANSPOSITION Left 10/15/2017   Procedure: FIRST STAGE BASILIC VEIN TRANSPOSITION LEFT UPPER EXTREMITY;  Surgeon: Serafina Mitchell, MD;  Location: [facility];  Service: Vascular;  Laterality: Left;   [place] Left 12/10/2017   Procedure: SECOND STAGE BASILIC VEIN TRANSPOSITION LEFT ARM;  Surgeon: Serafina Mitchell, MD;  Location: [facility];  Service: Vascular;  Laterality: Left;   COLONOSCOPY     CORONARY ANGIOPLASTY WITH STENT PLACEMENT  07/2009   3 stents   ELBOW SURGERY     LEFT   ENDARTERECTOMY Right 06/06/2016   Procedure: RIGHT CAROTID ENDARTERECTOMY WITH PATCH ANGIOPLASTY;  Surgeon: Serafina Mitchell, MD;  Location: [facility];  Service: Vascular;  Laterality: Right;   EP IMPLANTABLE DEVICE N/A  05/16/2015   Procedure: Loop Recorder Insertion;  Surgeon: Thompson Grayer, MD;  Location: [facility];  Service: Cardiovascular;  Laterality: N/A;   ESOPHAGOSCOPY WITH DILITATION     FISTULA SUPERFICIALIZATION Right 12/05/2016   Procedure: Superficialization and Ligation of Branches OF RIGHT ARM RADIOCEPHALIC FISTULA;  Surgeon: Serafina Mitchell, MD;  Location: [facility];  Service: Vascular;  Laterality: Right;   NOSE SURGERY     PERIPHERAL VASCULAR BALLOON ANGIOPLASTY  06/24/2017   Procedure: PERIPHERAL VASCULAR BALLOON ANGIOPLASTY;  Surgeon: Serafina Mitchell, MD;  Location: [facility];  Service: Cardiovascular;;  rt arm fistula    stents     TEE WITHOUT CARDIOVERSION N/A 05/16/2015   Procedure: TRANSESOPHAGEAL ECHOCARDIOGRAM (TEE);  Surgeon: Thayer Headings, MD;  Location: [facility];  Service: Cardiovascular;  Laterality: N/A;     Home Medications:  Prior to Admission medications   Medication Sig Start Date End Date Taking? Authorizing Provider  ACCU-CHEK AVIVA PLUS test strip  12/09/17   [provider]  aspirin 81 MG tablet Take 1 tablet (81 mg total) by mouth daily. 12/26/17   Richardo Priest, MD  calcium acetate (PHOSLO) 667 MG tablet Take 1,334 mg by mouth See admin instructions. Take 2 tablets (1334 mg) by mouth daily with any meals or snacks. 10/14/17   [provider]  clopidogrel (PLAVIX) 75 MG tablet Take 1 tablet (75 mg total) by mouth daily. 05/24/16   Reyne Dumas, MD  ezetimibe (ZETIA) 10 MG tablet Take 1 tablet (10 mg total) by mouth daily. 05/25/16   Reyne Dumas, MD  fexofenadine (ALLEGRA) 180 MG tablet Take 180 mg by mouth daily.    [provider]  folic acid-vitamin b complex-vitamin c-selenium-zinc (DIALYVITE) 3 MG TABS tablet Take 1 tablet by mouth daily.    [provider]  hydrALAZINE (APRESOLINE) 50 MG tablet Take 50 mg by mouth See admin instructions. Sunday, Monday, Wednesday & Friday patient takes 1 tablet (50 mg) by mouth  twice daily. Tuesday, Thursday, & Saturdays take 1 tablet at bedtime 06/13/15   [provider]  HYDROcodone-acetaminophen (NORCO/VICODIN) 5-325 MG tablet Take 1 tablet by mouth every 4 (four) hours as needed. 01/10/19   Maudie Flakes, MD  insulin degludec (TRESIBA FLEXTOUCH) 100 UNIT/ML SOPN FlexTouch Pen Inject 25 Units into the skin at bedtime.     [provider]  levothyroxine (SYNTHROID, LEVOTHROID) 50 MCG tablet Take 50 mcg by mouth daily before breakfast.  03/03/15   [provider]  lidocaine (LIDODERM) 5 % Place 1 patch onto the skin daily. Remove & Discard patch within 12 hours or as directed by MD 01/10/19   Maudie Flakes, MD  metoprolol succinate (  TOPROL-XL) 50 MG 24 hr tablet TAKE 1 TABLET BY MOUTH IN  THE EVENING TUESDAY,  THURSDAY, SATURDAY AND 1  TABLET TWICE DAILY ALL  OTHER DAYS 01/18/19   Richardo Priest, MD  omega-3 acid ethyl esters (LOVAZA) 1 g capsule TAKE 2 CAPSULES (2 G TOTAL) BY MOUTH 2 (TWO) TIMES DAILY. 01/07/19   Richardo Priest, MD  pantoprazole (PROTONIX) 40 MG tablet Take 40 mg by mouth every evening.     [provider]  sertraline (ZOLOFT) 20 MG/ML concentrated solution Take 20 mg by mouth daily.  07/27/18   [provider]  simvastatin (ZOCOR) 40 MG tablet Take 1 tablet (40 mg total) by mouth daily at 6 PM. 05/17/15   Velvet Bathe, MD    Inpatient Medications: Scheduled Meds:  aspirin EC  81 mg Oral Daily   calcium acetate  1,334 mg Oral TID WC   insulin aspart  0-9 Units Subcutaneous Q4H   levothyroxine  50 mcg Oral Q0600   metoprolol succinate  50 mg Oral BID   pantoprazole  40 mg Oral QPM   simvastatin  40 mg Oral q1800   Continuous Infusions:  heparin 1,400 Units/hr (01/27/2019 0651)   PRN Meds: acetaminophen, calcium acetate, ondansetron (ZOFRAN) IV  Allergies:    Allergies  Allergen Reactions   Lisinopril Other (See Comments)    "made me lose my voice"    Social History:   Social History    Socioeconomic History   Marital status: Married    Spouse name: Not on file   Number of children: Not on file   Years of education: Not on file   Highest education level: Not on file  Occupational History   Not on file  Tobacco Use   Smoking status: Former Smoker    Types: Cigarettes   Smokeless tobacco: Never Used   Tobacco comment: quit age 66  Substance and Sexual Activity   Alcohol use: Yes    Alcohol/week: 2.0 standard drinks    Types: 2 Glasses of wine per week    Comment: rarely   Drug use: No   Sexual activity: Not on file  Other Topics Concern   Not on file  Social History Narrative   Not on file   Social Determinants of Health   Financial Resource Strain:    Difficulty of Paying Living Expenses: Not on file  Food Insecurity:    Worried About [place] in the Last Year: Not on file   Ran Out of Food in the Last Year: Not on file  Transportation Needs:    Lack of Transportation (Medical): Not on file   Lack of Transportation (Non-Medical): Not on file  Physical Activity:    Days of Exercise per Week: Not on file   Minutes of Exercise per Session: Not on file  Stress:    Feeling of Stress : Not on file  Social Connections:    Frequency of Communication with Friends and Family: Not on file   Frequency of Social Gatherings with Friends and Family: Not on file   Attends Religious Services: Not on file   Active Member of Clubs or Organizations: Not on file   Attends Archivist Meetings: Not on file   Marital Status: Not on file  Intimate Partner Violence:    Fear of Current or Ex-Partner: Not on file   Emotionally Abused: Not on file   Physically Abused: Not on file   Sexually Abused: Not on file  Family History:   Family History  Problem Relation Age of Onset   Diabetes Mother    Dementia Maternal Grandfather    Heart attack Neg Hx      ROS:  Please see the history of present illness.  All other ROS reviewed and negative.      Physical Exam/Data:   Vitals:   02/04/2019 0041 02/05/2019 0106 02/06/2019 0343 02/09/2019 0807  BP: (!) 183/72  (!) 170/67 (!) 145/70  Pulse: 64  63 62  Resp:    16  Temp: 97.7 F (36.5 C)  98.2 F (36.8 C) 98.1 F (36.7 C)  TempSrc: Oral  Oral Oral  SpO2: 99%  98% 98%  Weight:  73.7 kg    Height:  _0  (1.727 m)     No intake or output data in the 24 hours ending 01/30/2019 0914 Last 3 Weights 02/06/2019 02/12/2019 01/10/2019  Weight (lbs) 162 lb 8 oz 165 lb 165 lb  Weight (kg) 73.71 kg 74.844 kg 74.844 kg     Body mass index is 24.71 kg/m.  General:  Well nourished, well developed, in no acute distress HEENT: normal Lymph: no adenopathy Neck: no JVD Endocrine:  No thryomegaly Vascular: No carotid bruits; FA pulses 2+ bilaterally without bruits  Cardiac:  normal S1, S2; RRR; systolic murmur  Lungs:  clear to auscultation bilaterally, no wheezing, rhonchi or rales  Abd: soft, nontender, no hepatomegaly  Ext: no edema Musculoskeletal:  No deformities, BUE and BLE strength normal and equal Skin: warm and dry  Neuro:  CNs 2-12 intact, no focal abnormalities noted Psych:  Normal affect   EKG:  The EKG was personally reviewed and demonstrates:  Afib RVR, 158 bpm, LVH Telemetry:  Telemetry was personally reviewed and demonstrates:  NSR with PVCs, ventricular bigeminy, HR 60s, first degree AV block (PRI 0.24 s)  Relevant CV Studies:  Echo ordered  Echo 04/2016 Study Conclusions   - Left ventricle: The cavity size was normal. Wall thickness was   increased in a pattern of mild LVH. There was mild focal basal   hypertrophy of the septum. Systolic function was normal. The   estimated ejection fraction was in the range of 55% to 60%. Wall   motion was normal; there were no regional wall motion   abnormalities. Doppler parameters are consistent with abnormal   left ventricular relaxation (grade 1 diastolic dysfunction).   Doppler parameters are consistent with high ventricular  filling   pressure. - Mitral valve: Severely calcified annulus.   Impressions:   - Normal LV systolic function; grade 1 diastolic dysfunction with   elevated LV filling pressure; severe MAC; trace MR and TR.  Laboratory Data:  High Sensitivity Troponin:   Recent Labs  Lab 02/06/2019 1736 02/04/2019 1922 02/19/2019 2200 01/28/2019 0124  TROPONINIHS 81* 856* 4,043* 4,270*     Chemistry Recent Labs  Lab 02/12/2019 1736  NA 142  K 3.8  CL 103  CO2 25  GLUCOSE 233*  BUN 34*  CREATININE 6.23*  CALCIUM 9.5  GFRNONAA 8*  GFRAA 9*  ANIONGAP 14    Recent Labs  Lab 02/16/2019 1736  PROT 6.5  ALBUMIN 3.5  AST 18  ALT 19  ALKPHOS 87  BILITOT 0.4   Hematology Recent Labs  Lab 02/09/2019 1736 02/08/2019 0549  WBC 5.5 5.0  RBC 3.93* 3.68*  HGB 12.2* 12.4*  HCT 38.9* 36.7*  MCV 99.0 99.7  MCH 31.0 33.7  MCHC 31.4 33.8  RDW 14.7 14.5  PLT  PLATELET CLUMPS NOTED ON SMEAR, COUNT APPEARS DECREASED 123*   BNPNo results for input(s): BNP, PROBNP in the last 168 hours.  DDimer No results for input(s): DDIMER in the last 168 hours.   Radiology/Studies:  DG Chest Port 1 View  Result Date: 02/14/2019 CLINICAL DATA:  Weakness. Body aches. Chest pain. Shortness of breath. EXAM: PORTABLE CHEST 1 VIEW COMPARISON:  01/10/2019 FINDINGS: Numerous leads and wires project over the chest. Loop recorder. Apical lordotic positioning. Mild cardiomegaly. Atherosclerosis in the transverse aorta. No pleural effusion or pneumothorax. Lucency at the apices, suggesting emphysema. No lobar consolidation. No congestive failure. IMPRESSION: Cardiomegaly, without acute disease. Aortic Atherosclerosis (ICD10-I70.0). Electronically Signed   By: Abigail Miyamoto M.D.   On: 01/30/2019 18:13     Assessment and Plan:   New onset Afib Patient was brought by EMS found to be in afib RVR, rates reportedly up to 180s. Converted to NSR in the ED after metoprolol 5 mg.  - Patient was started on IV heparin - potassium 3.8  . Goal >4 - Magnesium 1.2 < replete - Check TSH - Echo ordered. Echo from 2018 showed preserved EF with G1DD - CHADSVASC = 8(CHF, HTN, DM2, Age, stroke, vascular dz) - Patient will need lifelong anticoagulation, likely with Eliquis 2.5 mg given ESRD. However continue heparin for possible procedure.   Chronic diastolic HF - Echo from 3729 showed preserved EF with G1DD - echo pending - CXR with no acute changes - Patient is euvolemic on exam - Home metoprolol 50 mg was continued  Elevated Troponin/CAD s/p DES x 3 - On Aspirin at baseline>>continued - No chest pain - Hs troponin trend  856 > 4,43 > 4,270 - Patient will likely need ischemic eval. Patient is NPO for possible cath. Risks and benefits of cardiac catheterization have been discussed with the patient.  These include bleeding, infection, kidney damage, stroke, heart attack, death.  The patient understands these risks and is willing to proceed. - Md to see  ESRD on HD - TTS schedule  HLD - continue simvastatin - check FLP  Hypothyroidism - continue synthroid - check TSH  DM2 - SSI per IM - A1C 7.2   For questions or updates, please contact [facility] Please consult [email or web address] for contact info under     Signed, Cadence Ninfa Meeker, PA-C  02/16/2019 9:15 AM  Patient examined chart reviewed Discussed care with renal, PA and patient. Exam with chronically ill elderly white male. Fistula in LUE with good thrill. Post right CEA. AS murmur with ILR over left chest Telemetry with conversion to NSR. ECG with marked ST depression in rapid afib with persistence post conversion. No chest pain now. Troponin peak over 4000. COVID negative Bedside echo being done Now Preliminary LVH normal EF no RWMA and moderate AS. Plan for cath latter today. Post cath will need anticoagulation with eliquis If intervention done will likely need DAT with eliquis for a month then Plavix and eliquis  Currently on heparin   Jenkins Rouge MD  [name]

## 2019-02-23 NOTE — Progress Notes (Signed)
  Echocardiogram 2D Echocardiogram has been performed.  Robert [name] 02/10/2019, 11:02 AM

## 2019-02-23 NOTE — Consult Note (Addendum)
Cardiology Consultation:   Patient ID: Robert [name]. MRN: [id number]; DOB: 1935/09/28  Admit date: 02/06/2019 Date of Consult: 01/28/2019  Primary Care Provider: Burnard Bunting, MD Primary Cardiologist: Shirlee More, MD  Primary Electrophysiologist:  None    Patient Profile:   Robert [name]. is a 83 y.o. male with a hx of ESRD on HD, HTN, DM2, CAD s/p DES x 3, stroke x2 (2017 and 2018) s/p loop recorder, carotid artery disease s/p right CEA 2018 who is being seen today for the evaluation of new onset afib at the request of Dr. Alcario Drought.  History of Present Illness:   [name] is followed by Dr. Bettina Gavia. The patient has CAD s/p DES x 3, last PCI stent in 2011 to [facility] (per chart review, no cath note in epic). He had a stroke in 2018 and had a loop recorder implanted. Echo in 2018 showed EF 55-60%, G1DD, mild LVH, severe MAC, trace MR and TR.  His last Loop recorder check was 10/19/18 which did not show any arrhythmias. He had a televisit with Dr. Rayann Heman 11/04/18 who explained loop recorder at RRT and patient preferred to leave it in and would call for removal in the future.   The patient presented to the ED 01/29/2019 for generalized weakness and tachycardia. The patient was in his normal state of health that morning. He woke up and went to the ophthalmologist for a check-up and went home. At around 2 PM he started feeling generally bad and lightheadedness. He felt weak all over and shortness of breath. No chest pain. No fever, cough, or recent illness. No lower leg edema or orthopnea. He called EMS and they reported Afib RVR rate 180. He was given cardizem IV and HR improved to 140 bpm.   In the ED BP was 119/104, afebrile, RR 18, 98% O2. Labs showed potassium 3.8, glucose 233, calcium 9.5. WBC 5.5, Hgb 12.2. EKG showed Afib RVR, 159 bpm with likey rate related changes. CXR significant for cardiomegaly with no acute changes. HS troponin 81 and the second was 856. He was given 5 mg of  metoprolol and converted to NSR. Follow-up EKG showed NST 70 bpm with PAC, and minimal ST depression V5 and II.  The patient was admitted for further observation.     Heart Pathway Score:     Past Medical History:  Diagnosis Date   Allergy    year round   Anemia    Cancer ([place])    PSA ELEVATED   Carotid artery occlusion    Cataract    Depression    Diabetes mellitus    Dialysis patient ([place])    Dyspnea    ESRD (end stage renal disease) ([place])    [place]   GERD (gastroesophageal reflux disease)    Hearing deficit    History of kidney stones    Hyperlipidemia    Hypertension    Hypothyroidism    Myocardial infarction [facility]) 2011   Stroke (cerebrum) ([facility])    weakness in left hand  04/2015, 05/20/16   Thyroid disease    hypothyroid    Past Surgical History:  Procedure Laterality Date   A/V FISTULAGRAM Right 06/24/2017   Procedure: A/V FISTULAGRAM;  Surgeon: Serafina Mitchell, MD;  Location: [place] [facility];  Service: Cardiovascular;  Laterality: Right;  rt  lower arm   AV FISTULA PLACEMENT Right 07/25/2016   Procedure: ARTERIOVENOUS (AV) FISTULA CREATION-RIGHT;  Surgeon: Serafina Mitchell, MD;  Location: [place];  Service: Vascular;  Laterality: Right;   BASCILIC VEIN TRANSPOSITION Left 10/15/2017   Procedure: FIRST STAGE BASILIC VEIN TRANSPOSITION LEFT UPPER EXTREMITY;  Surgeon: Serafina Mitchell, MD;  Location: [place];  Service: Vascular;  Laterality: Left;   [place] Left 12/10/2017   Procedure: SECOND STAGE BASILIC VEIN TRANSPOSITION LEFT ARM;  Surgeon: Serafina Mitchell, MD;  Location: [place];  Service: Vascular;  Laterality: Left;   COLONOSCOPY     CORONARY ANGIOPLASTY WITH STENT PLACEMENT  07/2009   3 stents   ELBOW SURGERY     LEFT   ENDARTERECTOMY Right 06/06/2016   Procedure: RIGHT CAROTID ENDARTERECTOMY WITH PATCH ANGIOPLASTY;  Surgeon: Serafina Mitchell, MD;  Location: [place];  Service: Vascular;  Laterality: Right;   EP IMPLANTABLE DEVICE N/A  05/16/2015   Procedure: Loop Recorder Insertion;  Surgeon: Thompson Grayer, MD;  Location: [place] [facility];  Service: Cardiovascular;  Laterality: N/A;   ESOPHAGOSCOPY WITH DILITATION     FISTULA SUPERFICIALIZATION Right 12/05/2016   Procedure: Superficialization and Ligation of Branches OF RIGHT ARM RADIOCEPHALIC FISTULA;  Surgeon: Serafina Mitchell, MD;  Location: [facility];  Service: Vascular;  Laterality: Right;   NOSE SURGERY     PERIPHERAL VASCULAR BALLOON ANGIOPLASTY  06/24/2017   Procedure: PERIPHERAL VASCULAR BALLOON ANGIOPLASTY;  Surgeon: Serafina Mitchell, MD;  Location: [place] [facility];  Service: Cardiovascular;;  rt arm fistula    stents     TEE WITHOUT CARDIOVERSION N/A 05/16/2015   Procedure: TRANSESOPHAGEAL ECHOCARDIOGRAM (TEE);  Surgeon: Thayer Headings, MD;  Location: [place];  Service: Cardiovascular;  Laterality: N/A;     Home Medications:  Prior to Admission medications   Medication Sig Start Date End Date Taking? Authorizing Provider  ACCU-CHEK AVIVA PLUS test strip  12/09/17   [provider]  aspirin 81 MG tablet Take 1 tablet (81 mg total) by mouth daily. 12/26/17   Richardo Priest, MD  calcium acetate (PHOSLO) 667 MG tablet Take 1,334 mg by mouth See admin instructions. Take 2 tablets (1334 mg) by mouth daily with any meals or snacks. 10/14/17   [provider]  clopidogrel (PLAVIX) 75 MG tablet Take 1 tablet (75 mg total) by mouth daily. 05/24/16   Reyne Dumas, MD  ezetimibe (ZETIA) 10 MG tablet Take 1 tablet (10 mg total) by mouth daily. 05/25/16   Reyne Dumas, MD  fexofenadine (ALLEGRA) 180 MG tablet Take 180 mg by mouth daily.    [provider]  folic acid-vitamin b complex-vitamin c-selenium-zinc (DIALYVITE) 3 MG TABS tablet Take 1 tablet by mouth daily.    [provider]  hydrALAZINE (APRESOLINE) 50 MG tablet Take 50 mg by mouth See admin instructions. Sunday, Monday, Wednesday & Friday patient takes 1 tablet (50 mg) by mouth  twice daily. Tuesday, Thursday, & Saturdays take 1 tablet at bedtime 06/13/15   [provider]  HYDROcodone-acetaminophen (NORCO/VICODIN) 5-325 MG tablet Take 1 tablet by mouth every 4 (four) hours as needed. 01/10/19   Maudie Flakes, MD  insulin degludec (TRESIBA FLEXTOUCH) 100 UNIT/ML SOPN FlexTouch Pen Inject 25 Units into the skin at bedtime.     [provider]  levothyroxine (SYNTHROID, LEVOTHROID) 50 MCG tablet Take 50 mcg by mouth daily before breakfast.  03/03/15   [provider]  lidocaine (LIDODERM) 5 % Place 1 patch onto the skin daily. Remove & Discard patch within 12 hours or as directed by MD 01/10/19   Maudie Flakes, MD  metoprolol succinate (  TOPROL-XL) 50 MG 24 hr tablet TAKE 1 TABLET BY MOUTH IN  THE EVENING TUESDAY,  THURSDAY, SATURDAY AND 1  TABLET TWICE DAILY ALL  OTHER DAYS 01/18/19   Richardo Priest, MD  omega-3 acid ethyl esters (LOVAZA) 1 g capsule TAKE 2 CAPSULES (2 G TOTAL) BY MOUTH 2 (TWO) TIMES DAILY. 01/07/19   Richardo Priest, MD  pantoprazole (PROTONIX) 40 MG tablet Take 40 mg by mouth every evening.     [provider]  sertraline (ZOLOFT) 20 MG/ML concentrated solution Take 20 mg by mouth daily.  07/27/18   [provider]  simvastatin (ZOCOR) 40 MG tablet Take 1 tablet (40 mg total) by mouth daily at 6 PM. 05/17/15   Velvet Bathe, MD    Inpatient Medications: Scheduled Meds:  aspirin EC  81 mg Oral Daily   calcium acetate  1,334 mg Oral TID WC   insulin aspart  0-9 Units Subcutaneous Q4H   levothyroxine  50 mcg Oral Q0600   metoprolol succinate  50 mg Oral BID   pantoprazole  40 mg Oral QPM   simvastatin  40 mg Oral q1800   Continuous Infusions:  heparin 1,400 Units/hr (02/19/2019 0651)   PRN Meds: acetaminophen, calcium acetate, ondansetron (ZOFRAN) IV  Allergies:    Allergies  Allergen Reactions   Lisinopril Other (See Comments)    "made me lose my voice"    Social History:   Social History    Socioeconomic History   Marital status: Married    Spouse name: Not on file   Number of children: Not on file   Years of education: Not on file   Highest education level: Not on file  Occupational History   Not on file  Tobacco Use   Smoking status: Former Smoker    Types: Cigarettes   Smokeless tobacco: Never Used   Tobacco comment: quit age 35  Substance and Sexual Activity   Alcohol use: Yes    Alcohol/week: 2.0 standard drinks    Types: 2 Glasses of wine per week    Comment: rarely   Drug use: No   Sexual activity: Not on file  Other Topics Concern   Not on file  Social History Narrative   Not on file   Social Determinants of Health   Financial Resource Strain:    Difficulty of Paying Living Expenses: Not on file  Food Insecurity:    Worried About [place] in the Last Year: Not on file   Ran Out of Food in the Last Year: Not on file  Transportation Needs:    Lack of Transportation (Medical): Not on file   Lack of Transportation (Non-Medical): Not on file  Physical Activity:    Days of Exercise per Week: Not on file   Minutes of Exercise per Session: Not on file  Stress:    Feeling of Stress : Not on file  Social Connections:    Frequency of Communication with Friends and Family: Not on file   Frequency of Social Gatherings with Friends and Family: Not on file   Attends Religious Services: Not on file   Active Member of Clubs or Organizations: Not on file   Attends Archivist Meetings: Not on file   Marital Status: Not on file  Intimate Partner Violence:    Fear of Current or Ex-Partner: Not on file   Emotionally Abused: Not on file   Physically Abused: Not on file   Sexually Abused: Not on file  Family History:   Family History  Problem Relation Age of Onset   Diabetes Mother    Dementia Maternal Grandfather    Heart attack Neg Hx      ROS:  Please see the history of present illness.  All other ROS reviewed and negative.      Physical Exam/Data:   Vitals:   02/13/2019 0041 01/27/2019 0106 02/16/2019 0343 02/02/2019 0807  BP: (!) 183/72  (!) 170/67 (!) 145/70  Pulse: 64  63 62  Resp:    16  Temp: 97.7 F (36.5 C)  98.2 F (36.8 C) 98.1 F (36.7 C)  TempSrc: Oral  Oral Oral  SpO2: 99%  98% 98%  Weight:  73.7 kg    Height:  _0  (1.727 m)     No intake or output data in the 24 hours ending 02/18/2019 0914 Last 3 Weights 01/26/2019 01/31/2019 01/10/2019  Weight (lbs) 162 lb 8 oz 165 lb 165 lb  Weight (kg) 73.71 kg 74.844 kg 74.844 kg     Body mass index is 24.71 kg/m.  General:  Well nourished, well developed, in no acute distress HEENT: normal Lymph: no adenopathy Neck: no JVD Endocrine:  No thryomegaly Vascular: No carotid bruits; FA pulses 2+ bilaterally without bruits  Cardiac:  normal S1, S2; RRR; systolic murmur  Lungs:  clear to auscultation bilaterally, no wheezing, rhonchi or rales  Abd: soft, nontender, no hepatomegaly  Ext: no edema Musculoskeletal:  No deformities, BUE and BLE strength normal and equal Skin: warm and dry  Neuro:  CNs 2-12 intact, no focal abnormalities noted Psych:  Normal affect   EKG:  The EKG was personally reviewed and demonstrates:  Afib RVR, 158 bpm, LVH Telemetry:  Telemetry was personally reviewed and demonstrates:  NSR with PVCs, ventricular bigeminy, HR 60s, first degree AV block (PRI 0.24 s)  Relevant CV Studies:  Echo ordered  Echo 04/2016 Study Conclusions   - Left ventricle: The cavity size was normal. Wall thickness was   increased in a pattern of mild LVH. There was mild focal basal   hypertrophy of the septum. Systolic function was normal. The   estimated ejection fraction was in the range of 55% to 60%. Wall   motion was normal; there were no regional wall motion   abnormalities. Doppler parameters are consistent with abnormal   left ventricular relaxation (grade 1 diastolic dysfunction).   Doppler parameters are consistent with high ventricular  filling   pressure. - Mitral valve: Severely calcified annulus.   Impressions:   - Normal LV systolic function; grade 1 diastolic dysfunction with   elevated LV filling pressure; severe MAC; trace MR and TR.  Laboratory Data:  High Sensitivity Troponin:   Recent Labs  Lab 02/20/2019 1736 02/10/2019 1922 02/17/2019 2200 02/17/2019 0124  TROPONINIHS 81* 856* 4,043* 4,270*     Chemistry Recent Labs  Lab 02/21/2019 1736  NA 142  K 3.8  CL 103  CO2 25  GLUCOSE 233*  BUN 34*  CREATININE 6.23*  CALCIUM 9.5  GFRNONAA 8*  GFRAA 9*  ANIONGAP 14    Recent Labs  Lab 02/21/2019 1736  PROT 6.5  ALBUMIN 3.5  AST 18  ALT 19  ALKPHOS 87  BILITOT 0.4   Hematology Recent Labs  Lab 02/17/2019 1736 01/28/2019 0549  WBC 5.5 5.0  RBC 3.93* 3.68*  HGB 12.2* 12.4*  HCT 38.9* 36.7*  MCV 99.0 99.7  MCH 31.0 33.7  MCHC 31.4 33.8  RDW 14.7 14.5  PLT  PLATELET CLUMPS NOTED ON SMEAR, COUNT APPEARS DECREASED 123*   BNPNo results for input(s): BNP, PROBNP in the last 168 hours.  DDimer No results for input(s): DDIMER in the last 168 hours.   Radiology/Studies:  DG Chest Port 1 View  Result Date: 02/14/2019 CLINICAL DATA:  Weakness. Body aches. Chest pain. Shortness of breath. EXAM: PORTABLE CHEST 1 VIEW COMPARISON:  01/10/2019 FINDINGS: Numerous leads and wires project over the chest. Loop recorder. Apical lordotic positioning. Mild cardiomegaly. Atherosclerosis in the transverse aorta. No pleural effusion or pneumothorax. Lucency at the apices, suggesting emphysema. No lobar consolidation. No congestive failure. IMPRESSION: Cardiomegaly, without acute disease. Aortic Atherosclerosis (ICD10-I70.0). Electronically Signed   By: Abigail Miyamoto M.D.   On: 01/30/2019 18:13     Assessment and Plan:   New onset Afib Patient was brought by EMS found to be in afib RVR, rates reportedly up to 180s. Converted to NSR in the ED after metoprolol 5 mg.  - Patient was started on IV heparin - potassium 3.8  . Goal >4 - Magnesium 1.2 < replete - Check TSH - Echo ordered. Echo from 2018 showed preserved EF with G1DD - CHADSVASC = 8(CHF, HTN, DM2, Age, stroke, vascular dz) - Patient will need lifelong anticoagulation, likely with Eliquis 2.5 mg given ESRD. However continue heparin for possible procedure.   Chronic diastolic HF - Echo from 3729 showed preserved EF with G1DD - echo pending - CXR with no acute changes - Patient is euvolemic on exam - Home metoprolol 50 mg was continued  Elevated Troponin/CAD s/p DES x 3 - On Aspirin at baseline>>continued - No chest pain - Hs troponin trend  856 > 4,43 > 4,270 - Patient will likely need ischemic eval. Patient is NPO for possible cath. Risks and benefits of cardiac catheterization have been discussed with the patient.  These include bleeding, infection, kidney damage, stroke, heart attack, death.  The patient understands these risks and is willing to proceed. - Md to see  ESRD on HD - TTS schedule  HLD - continue simvastatin - check FLP  Hypothyroidism - continue synthroid - check TSH  DM2 - SSI per IM - A1C 7.2   For questions or updates, please contact [facility] Please consult [email or web address] for contact info under     Signed, Cadence Ninfa Meeker, PA-C  02/16/2019 9:15 AM  Patient examined chart reviewed Discussed care with renal, PA and patient. Exam with chronically ill elderly white male. Fistula in LUE with good thrill. Post right CEA. AS murmur with ILR over left chest Telemetry with conversion to NSR. ECG with marked ST depression in rapid afib with persistence post conversion. No chest pain now. Troponin peak over 4000. COVID negative Bedside echo being done Now Preliminary LVH normal EF no RWMA and moderate AS. Plan for cath latter today. Post cath will need anticoagulation with eliquis If intervention done will likely need DAT with eliquis for a month then Plavix and eliquis  Currently on heparin   Jenkins Rouge MD  [facility]

## 2019-02-24 ENCOUNTER — Inpatient Hospital Stay (HOSPITAL_COMMUNITY): Payer: Medicare Other | Admitting: Anesthesiology

## 2019-02-24 ENCOUNTER — Inpatient Hospital Stay (HOSPITAL_COMMUNITY): Payer: Medicare Other

## 2019-02-24 ENCOUNTER — Encounter (HOSPITAL_COMMUNITY): Payer: Self-pay | Admitting: Family Medicine

## 2019-02-24 ENCOUNTER — Inpatient Hospital Stay (HOSPITAL_COMMUNITY)
Admission: EM | Disposition: E | Payer: Self-pay | Source: Home / Self Care | Attending: Thoracic Surgery (Cardiothoracic Vascular Surgery)

## 2019-02-24 DIAGNOSIS — I2510 Atherosclerotic heart disease of native coronary artery without angina pectoris: Secondary | ICD-10-CM

## 2019-02-24 DIAGNOSIS — Z951 Presence of aortocoronary bypass graft: Secondary | ICD-10-CM

## 2019-02-24 HISTORY — PX: CLIPPING OF ATRIAL APPENDAGE: SHX5773

## 2019-02-24 HISTORY — PX: CORONARY ARTERY BYPASS GRAFT: SHX141

## 2019-02-24 LAB — POCT I-STAT, CHEM 8
BUN: 16 mg/dL (ref 8–23)
BUN: 16 mg/dL (ref 8–23)
BUN: 16 mg/dL (ref 8–23)
BUN: 18 mg/dL (ref 8–23)
BUN: 18 mg/dL (ref 8–23)
BUN: 17 mg/dL (ref 8–23)
Calcium, Ion: 1.14 mmol/L — ABNORMAL LOW (ref 1.15–1.40)
Calcium, Ion: 1.12 mmol/L — ABNORMAL LOW (ref 1.15–1.40)
Calcium, Ion: 0.98 mmol/L — ABNORMAL LOW (ref 1.15–1.40)
Calcium, Ion: 1.02 mmol/L — ABNORMAL LOW (ref 1.15–1.40)
Calcium, Ion: 1 mmol/L — ABNORMAL LOW (ref 1.15–1.40)
Calcium, Ion: 1.18 mmol/L (ref 1.15–1.40)
Chloride: 101 mmol/L (ref 98–111)
Chloride: 103 mmol/L (ref 98–111)
Chloride: 108 mmol/L (ref 98–111)
Chloride: 106 mmol/L (ref 98–111)
Chloride: 110 mmol/L (ref 98–111)
Chloride: 112 mmol/L — ABNORMAL HIGH (ref 98–111)
Creatinine, Ser: 3.7 mg/dL — ABNORMAL HIGH (ref 0.61–1.24)
Creatinine, Ser: 3.6 mg/dL — ABNORMAL HIGH (ref 0.61–1.24)
Creatinine, Ser: 3.4 mg/dL — ABNORMAL HIGH (ref 0.61–1.24)
Creatinine, Ser: 3.5 mg/dL — ABNORMAL HIGH (ref 0.61–1.24)
Creatinine, Ser: 3.4 mg/dL — ABNORMAL HIGH (ref 0.61–1.24)
Creatinine, Ser: 3.2 mg/dL — ABNORMAL HIGH (ref 0.61–1.24)
Glucose, Bld: 109 mg/dL — ABNORMAL HIGH (ref 70–99)
Glucose, Bld: 99 mg/dL (ref 70–99)
Glucose, Bld: 108 mg/dL — ABNORMAL HIGH (ref 70–99)
Glucose, Bld: 112 mg/dL — ABNORMAL HIGH (ref 70–99)
Glucose, Bld: 115 mg/dL — ABNORMAL HIGH (ref 70–99)
Glucose, Bld: 89 mg/dL (ref 70–99)
HCT: 27 % — ABNORMAL LOW (ref 39.0–52.0)
HCT: 26 % — ABNORMAL LOW (ref 39.0–52.0)
HCT: 22 % — ABNORMAL LOW (ref 39.0–52.0)
HCT: 22 % — ABNORMAL LOW (ref 39.0–52.0)
HCT: 20 % — ABNORMAL LOW (ref 39.0–52.0)
HCT: 24 % — ABNORMAL LOW (ref 39.0–52.0)
Hemoglobin: 9.2 g/dL — ABNORMAL LOW (ref 13.0–17.0)
Hemoglobin: 8.8 g/dL — ABNORMAL LOW (ref 13.0–17.0)
Hemoglobin: 7.5 g/dL — ABNORMAL LOW (ref 13.0–17.0)
Hemoglobin: 7.5 g/dL — ABNORMAL LOW (ref 13.0–17.0)
Hemoglobin: 6.8 g/dL — CL (ref 13.0–17.0)
Hemoglobin: 8.2 g/dL — ABNORMAL LOW (ref 13.0–17.0)
Potassium: 3.9 mmol/L (ref 3.5–5.1)
Potassium: 3.8 mmol/L (ref 3.5–5.1)
Potassium: 4.9 mmol/L (ref 3.5–5.1)
Potassium: 4.5 mmol/L (ref 3.5–5.1)
Potassium: 4.9 mmol/L (ref 3.5–5.1)
Potassium: 4.1 mmol/L (ref 3.5–5.1)
Sodium: 140 mmol/L (ref 135–145)
Sodium: 140 mmol/L (ref 135–145)
Sodium: 142 mmol/L (ref 135–145)
Sodium: 143 mmol/L (ref 135–145)
Sodium: 143 mmol/L (ref 135–145)
Sodium: 145 mmol/L (ref 135–145)
TCO2: 29 mmol/L (ref 22–32)
TCO2: 29 mmol/L (ref 22–32)
TCO2: 26 mmol/L (ref 22–32)
TCO2: 26 mmol/L (ref 22–32)
TCO2: 23 mmol/L (ref 22–32)
TCO2: 24 mmol/L (ref 22–32)

## 2019-02-24 LAB — POCT I-STAT 7, (LYTES, BLD GAS, ICA,H+H)
Acid-base deficit: 4 mmol/L — ABNORMAL HIGH (ref 0.0–2.0)
Bicarbonate: 23.8 mmol/L (ref 20.0–28.0)
Bicarbonate: 22.1 mmol/L (ref 20.0–28.0)
Calcium, Ion: 1 mmol/L — ABNORMAL LOW (ref 1.15–1.40)
Calcium, Ion: 1.14 mmol/L — ABNORMAL LOW (ref 1.15–1.40)
HCT: 19 % — ABNORMAL LOW (ref 39.0–52.0)
HCT: 24 % — ABNORMAL LOW (ref 39.0–52.0)
Hemoglobin: 6.5 g/dL — CL (ref 13.0–17.0)
Hemoglobin: 8.2 g/dL — ABNORMAL LOW (ref 13.0–17.0)
O2 Saturation: 100 %
O2 Saturation: 100 %
Patient temperature: 35.6
Potassium: 3.6 mmol/L (ref 3.5–5.1)
Potassium: 4.7 mmol/L (ref 3.5–5.1)
Sodium: 144 mmol/L (ref 135–145)
Sodium: 146 mmol/L — ABNORMAL HIGH (ref 135–145)
TCO2: 25 mmol/L (ref 22–32)
TCO2: 23 mmol/L (ref 22–32)
pCO2 arterial: 34.6 mmHg (ref 32.0–48.0)
pCO2 arterial: 41.6 mmHg (ref 32.0–48.0)
pH, Arterial: 7.446 (ref 7.350–7.450)
pH, Arterial: 7.326 — ABNORMAL LOW (ref 7.350–7.450)
pO2, Arterial: 410 mmHg — ABNORMAL HIGH (ref 83.0–108.0)
pO2, Arterial: 181 mmHg — ABNORMAL HIGH (ref 83.0–108.0)

## 2019-02-24 LAB — CBC
HCT: 26.1 % — ABNORMAL LOW (ref 39.0–52.0)
HCT: 21.4 % — ABNORMAL LOW (ref 39.0–52.0)
Hemoglobin: 8.3 g/dL — ABNORMAL LOW (ref 13.0–17.0)
Hemoglobin: 7 g/dL — ABNORMAL LOW (ref 13.0–17.0)
MCH: 31.1 pg (ref 26.0–34.0)
MCH: 31.8 pg (ref 26.0–34.0)
MCHC: 31.8 g/dL (ref 30.0–36.0)
MCHC: 32.7 g/dL (ref 30.0–36.0)
MCV: 97.8 fL (ref 80.0–100.0)
MCV: 97.3 fL (ref 80.0–100.0)
Platelets: 105 10*3/uL — ABNORMAL LOW (ref 150–400)
Platelets: 122 10*3/uL — ABNORMAL LOW (ref 150–400)
RBC: 2.67 MIL/uL — ABNORMAL LOW (ref 4.22–5.81)
RBC: 2.2 MIL/uL — ABNORMAL LOW (ref 4.22–5.81)
RDW: 15.8 % — ABNORMAL HIGH (ref 11.5–15.5)
RDW: 16.3 % — ABNORMAL HIGH (ref 11.5–15.5)
WBC: 8.2 10*3/uL (ref 4.0–10.5)
WBC: 8.8 10*3/uL (ref 4.0–10.5)
nRBC: 0 % (ref 0.0–0.2)
nRBC: 0 % (ref 0.0–0.2)

## 2019-02-24 LAB — BASIC METABOLIC PANEL
Anion gap: 11 (ref 5–15)
BUN: 20 mg/dL (ref 8–23)
CO2: 15 mmol/L — ABNORMAL LOW (ref 22–32)
Calcium: 7.1 mg/dL — ABNORMAL LOW (ref 8.9–10.3)
Chloride: 117 mmol/L — ABNORMAL HIGH (ref 98–111)
Creatinine, Ser: 3.83 mg/dL — ABNORMAL HIGH (ref 0.61–1.24)
GFR calc Af Amer: 16 mL/min — ABNORMAL LOW (ref >60)
GFR calc non Af Amer: 14 mL/min — ABNORMAL LOW (ref >60)
Glucose, Bld: 133 mg/dL — ABNORMAL HIGH (ref 70–99)
Potassium: 4.4 mmol/L (ref 3.5–5.1)
Sodium: 143 mmol/L (ref 135–145)

## 2019-02-24 LAB — CBC WITH DIFFERENTIAL/PLATELET
Abs Immature Granulocytes: 0.03 10*3/uL (ref 0.00–0.07)
Basophils Absolute: 0.1 10*3/uL (ref 0.0–0.1)
Basophils Relative: 1 %
Eosinophils Absolute: 0.1 10*3/uL (ref 0.0–0.5)
Eosinophils Relative: 2 %
HCT: 36 % — ABNORMAL LOW (ref 39.0–52.0)
Hemoglobin: 11.6 g/dL — ABNORMAL LOW (ref 13.0–17.0)
Immature Granulocytes: 1 %
Lymphocytes Relative: 16 %
Lymphs Abs: 1 10*3/uL (ref 0.7–4.0)
MCH: 31.1 pg (ref 26.0–34.0)
MCHC: 32.2 g/dL (ref 30.0–36.0)
MCV: 96.5 fL (ref 80.0–100.0)
Monocytes Absolute: 0.7 10*3/uL (ref 0.1–1.0)
Monocytes Relative: 12 %
Neutro Abs: 4.3 10*3/uL (ref 1.7–7.7)
Neutrophils Relative %: 68 %
Platelets: 118 10*3/uL — ABNORMAL LOW (ref 150–400)
RBC: 3.73 MIL/uL — ABNORMAL LOW (ref 4.22–5.81)
RDW: 15 % (ref 11.5–15.5)
WBC: 6.2 10*3/uL (ref 4.0–10.5)
nRBC: 0 % (ref 0.0–0.2)

## 2019-02-24 LAB — LIPID PANEL
Cholesterol: 112 mg/dL (ref 0–200)
HDL: 29 mg/dL — ABNORMAL LOW (ref >40)
LDL Cholesterol: 52 mg/dL (ref 0–99)
Total CHOL/HDL Ratio: 3.9 RATIO
Triglycerides: 157 mg/dL — ABNORMAL HIGH (ref <150)
VLDL: 31 mg/dL (ref 0–40)

## 2019-02-24 LAB — PROTIME-INR
INR: 1 (ref 0.8–1.2)
INR: 1.6 — ABNORMAL HIGH (ref 0.8–1.2)
Prothrombin Time: 12.7 seconds (ref 11.4–15.2)
Prothrombin Time: 19.1 seconds — ABNORMAL HIGH (ref 11.4–15.2)

## 2019-02-24 LAB — GLUCOSE, CAPILLARY
Glucose-Capillary: 101 mg/dL — ABNORMAL HIGH (ref 70–99)
Glucose-Capillary: 108 mg/dL — ABNORMAL HIGH (ref 70–99)
Glucose-Capillary: 111 mg/dL — ABNORMAL HIGH (ref 70–99)
Glucose-Capillary: 118 mg/dL — ABNORMAL HIGH (ref 70–99)
Glucose-Capillary: 121 mg/dL — ABNORMAL HIGH (ref 70–99)
Glucose-Capillary: 121 mg/dL — ABNORMAL HIGH (ref 70–99)
Glucose-Capillary: 91 mg/dL (ref 70–99)
Glucose-Capillary: 95 mg/dL (ref 70–99)
Glucose-Capillary: 96 mg/dL (ref 70–99)

## 2019-02-24 LAB — RENAL FUNCTION PANEL
Albumin: 3.3 g/dL — ABNORMAL LOW (ref 3.5–5.0)
Anion gap: 14 (ref 5–15)
BUN: 16 mg/dL (ref 8–23)
CO2: 24 mmol/L (ref 22–32)
Calcium: 8.7 mg/dL — ABNORMAL LOW (ref 8.9–10.3)
Chloride: 102 mmol/L (ref 98–111)
Creatinine, Ser: 3.61 mg/dL — ABNORMAL HIGH (ref 0.61–1.24)
GFR calc Af Amer: 17 mL/min — ABNORMAL LOW (ref >60)
GFR calc non Af Amer: 15 mL/min — ABNORMAL LOW (ref >60)
Glucose, Bld: 95 mg/dL (ref 70–99)
Phosphorus: 2.9 mg/dL (ref 2.5–4.6)
Potassium: 4.5 mmol/L (ref 3.5–5.1)
Sodium: 140 mmol/L (ref 135–145)

## 2019-02-24 LAB — APTT: aPTT: 33 seconds (ref 24–36)

## 2019-02-24 LAB — HEMOGLOBIN AND HEMATOCRIT, BLOOD
HCT: 23.9 % — ABNORMAL LOW (ref 39.0–52.0)
Hemoglobin: 7.7 g/dL — ABNORMAL LOW (ref 13.0–17.0)

## 2019-02-24 LAB — MAGNESIUM
Magnesium: 2 mg/dL (ref 1.7–2.4)
Magnesium: 1.8 mg/dL (ref 1.7–2.4)

## 2019-02-24 LAB — ECHO INTRAOPERATIVE TEE
Height: 68 in
Weight: 2574.97 oz

## 2019-02-24 LAB — PREPARE RBC (CROSSMATCH)

## 2019-02-24 LAB — PLATELET COUNT: Platelets: 89 10*3/uL — ABNORMAL LOW (ref 150–400)

## 2019-02-24 LAB — MRSA PCR SCREENING: MRSA by PCR: NEGATIVE

## 2019-02-24 SURGERY — CORONARY ARTERY BYPASS GRAFTING (CABG)
Anesthesia: General | Site: Chest

## 2019-02-24 MED ORDER — OXYCODONE HCL 5 MG PO TABS: 5.0000 mg | ORAL_TABLET | ORAL | Status: DC | PRN

## 2019-02-24 MED ORDER — FENTANYL CITRATE (PF) 250 MCG/5ML IJ SOLN
INTRAMUSCULAR | Status: AC
  Filled 2019-02-24: qty 25

## 2019-02-24 MED ORDER — PANTOPRAZOLE SODIUM 40 MG PO TBEC: 40.0000 mg | DELAYED_RELEASE_TABLET | Freq: Every day | ORAL | Status: DC

## 2019-02-24 MED ORDER — ONDANSETRON HCL 4 MG/2ML IJ SOLN
4.0000 mg | Freq: Four times a day (QID) | INTRAMUSCULAR | Status: DC | PRN
  Administered 2019-03-09 – 2019-03-10 (×2): 4 mg via INTRAVENOUS
  Filled 2019-02-24 (×2): qty 2

## 2019-02-24 MED ORDER — BISACODYL 10 MG RE SUPP: 10.0000 mg | Freq: Every day | RECTAL | Status: DC

## 2019-02-24 MED ORDER — SODIUM CHLORIDE 0.9% IV SOLUTION: Freq: Once | INTRAVENOUS | Status: DC

## 2019-02-24 MED ORDER — HEPARIN SODIUM (PORCINE) 1000 UNIT/ML IJ SOLN
INTRAMUSCULAR | Status: DC | PRN
  Administered 2019-02-24: 26000 [IU] via INTRAVENOUS

## 2019-02-24 MED ORDER — MORPHINE SULFATE (PF) 2 MG/ML IV SOLN
1.0000 mg | INTRAVENOUS | Status: DC | PRN
  Administered 2019-02-24 – 2019-02-26 (×9): 2 mg via INTRAVENOUS
  Administered 2019-02-26 (×2): 4 mg via INTRAVENOUS
  Administered 2019-02-27 – 2019-02-28 (×2): 1 mg via INTRAVENOUS
  Administered 2019-03-04 – 2019-03-08 (×5): 2 mg via INTRAVENOUS
  Filled 2019-02-24 (×4): qty 1
  Filled 2019-02-24: qty 2
  Filled 2019-02-24 (×14): qty 1

## 2019-02-24 MED ORDER — CALCIUM CHLORIDE 10 % IV SOLN
INTRAVENOUS | Status: DC | PRN
  Administered 2019-02-24 (×3): 100 mg via INTRAVENOUS

## 2019-02-24 MED ORDER — ACETAMINOPHEN 500 MG PO TABS: 1000.0000 mg | ORAL_TABLET | Freq: Four times a day (QID) | ORAL | Status: DC

## 2019-02-24 MED ORDER — METOPROLOL TARTRATE 12.5 MG HALF TABLET: 12.5000 mg | ORAL_TABLET | Freq: Two times a day (BID) | ORAL | Status: DC

## 2019-02-24 MED ORDER — SODIUM CHLORIDE 0.45 % IV SOLN: INTRAVENOUS | Status: DC | PRN

## 2019-02-24 MED ORDER — POTASSIUM CHLORIDE 10 MEQ/50ML IV SOLN: 10.0000 meq | INTRAVENOUS | Status: AC

## 2019-02-24 MED ORDER — MIDAZOLAM HCL (PF) 10 MG/2ML IJ SOLN
INTRAMUSCULAR | Status: AC
  Filled 2019-02-24: qty 2

## 2019-02-24 MED ORDER — METOPROLOL TARTRATE 5 MG/5ML IV SOLN
2.5000 mg | INTRAVENOUS | Status: DC | PRN
  Administered 2019-02-25: 2.5 mg via INTRAVENOUS
  Administered 2019-02-27 – 2019-03-01 (×5): 5 mg via INTRAVENOUS
  Filled 2019-02-24 (×7): qty 5

## 2019-02-24 MED ORDER — PROPOFOL 10 MG/ML IV BOLUS
INTRAVENOUS | Status: DC | PRN
  Administered 2019-02-24: 50 mg via INTRAVENOUS

## 2019-02-24 MED ORDER — SODIUM CHLORIDE 0.9 % IV SOLN
20.0000 ug | Freq: Once | INTRAVENOUS | Status: AC
  Administered 2019-02-24: 20 ug via INTRAVENOUS
  Filled 2019-02-24: qty 5

## 2019-02-24 MED ORDER — SODIUM CHLORIDE 0.9 % IV SOLN: INTRAVENOUS | Status: DC

## 2019-02-24 MED ORDER — SODIUM CHLORIDE 0.9% FLUSH: 3.0000 mL | INTRAVENOUS | Status: DC | PRN

## 2019-02-24 MED ORDER — SODIUM CHLORIDE 0.9 % IV SOLN: INTRAVENOUS | Status: DC | PRN

## 2019-02-24 MED ORDER — PROPOFOL 10 MG/ML IV BOLUS
INTRAVENOUS | Status: AC
  Filled 2019-02-24: qty 20

## 2019-02-24 MED ORDER — ALBUMIN HUMAN 5 % IV SOLN: INTRAVENOUS | Status: DC | PRN

## 2019-02-24 MED ORDER — NITROGLYCERIN IN D5W 200-5 MCG/ML-% IV SOLN: 0.0000 ug/min | INTRAVENOUS | Status: DC

## 2019-02-24 MED ORDER — HEPARIN SODIUM (PORCINE) 1000 UNIT/ML IJ SOLN
INTRAMUSCULAR | Status: AC
  Filled 2019-02-24: qty 1

## 2019-02-24 MED ORDER — MIDAZOLAM HCL 2 MG/2ML IJ SOLN
2.0000 mg | INTRAMUSCULAR | Status: DC | PRN
  Administered 2019-02-24 – 2019-02-25 (×6): 2 mg via INTRAVENOUS
  Filled 2019-02-24 (×6): qty 2

## 2019-02-24 MED ORDER — ROCURONIUM BROMIDE 10 MG/ML (PF) SYRINGE
PREFILLED_SYRINGE | INTRAVENOUS | Status: DC | PRN
  Administered 2019-02-24: 100 mg via INTRAVENOUS
  Administered 2019-02-24: 50 mg via INTRAVENOUS
  Administered 2019-02-24: 100 mg via INTRAVENOUS

## 2019-02-24 MED ORDER — CHLORHEXIDINE GLUCONATE 0.12 % MT SOLN
15.0000 mL | OROMUCOSAL | Status: AC
  Administered 2019-02-24: 15 mL via OROMUCOSAL

## 2019-02-24 MED ORDER — ACETAMINOPHEN 650 MG RE SUPP
650.0000 mg | Freq: Once | RECTAL | Status: AC
  Administered 2019-02-24: 650 mg via RECTAL

## 2019-02-24 MED ORDER — DOBUTAMINE IN D5W 4-5 MG/ML-% IV SOLN
2.5000 ug/kg/min | INTRAVENOUS | Status: DC
  Administered 2019-02-24: 2.5 ug/kg/min via INTRAVENOUS
  Filled 2019-02-24: qty 250

## 2019-02-24 MED ORDER — NICARDIPINE HCL IN NACL 20-0.86 MG/200ML-% IV SOLN
5.0000 mg/h | INTRAVENOUS | Status: DC
  Filled 2019-02-24: qty 200

## 2019-02-24 MED ORDER — SODIUM CHLORIDE 0.9 % IV SOLN: 250.0000 mL | INTRAVENOUS | Status: DC

## 2019-02-24 MED ORDER — MUPIROCIN 2 % EX OINT
1.0000 "application " | TOPICAL_OINTMENT | Freq: Two times a day (BID) | CUTANEOUS | Status: AC
  Administered 2019-02-24 – 2019-02-28 (×9): 1 via NASAL
  Filled 2019-02-24: qty 22

## 2019-02-24 MED ORDER — DEXTROSE 50 % IV SOLN
0.0000 mL | INTRAVENOUS | Status: DC | PRN
  Administered 2019-03-10 (×3): 50 mL via INTRAVENOUS
  Administered 2019-03-10: 08:00:00 25 mL via INTRAVENOUS
  Filled 2019-02-24 (×4): qty 50

## 2019-02-24 MED ORDER — DOCUSATE SODIUM 100 MG PO CAPS: 200.0000 mg | ORAL_CAPSULE | Freq: Every day | ORAL | Status: DC

## 2019-02-24 MED ORDER — METOPROLOL TARTRATE 25 MG/10 ML ORAL SUSPENSION: 12.5000 mg | Freq: Two times a day (BID) | ORAL | Status: DC

## 2019-02-24 MED ORDER — FAMOTIDINE IN NACL 20-0.9 MG/50ML-% IV SOLN
20.0000 mg | INTRAVENOUS | Status: AC
  Administered 2019-02-24 – 2019-02-25 (×2): 20 mg via INTRAVENOUS
  Filled 2019-02-24 (×3): qty 50

## 2019-02-24 MED ORDER — MAGNESIUM SULFATE 4 GM/100ML IV SOLN: 4.0000 g | Freq: Once | INTRAVENOUS | Status: DC

## 2019-02-24 MED ORDER — PHENYLEPHRINE 40 MCG/ML (10ML) SYRINGE FOR IV PUSH (FOR BLOOD PRESSURE SUPPORT)
PREFILLED_SYRINGE | INTRAVENOUS | Status: AC
  Filled 2019-02-24: qty 20

## 2019-02-24 MED ORDER — CHLORHEXIDINE GLUCONATE 0.12% ORAL RINSE (MEDLINE KIT)
15.0000 mL | Freq: Two times a day (BID) | OROMUCOSAL | Status: DC
  Administered 2019-02-24 – 2019-02-25 (×2): 15 mL via OROMUCOSAL

## 2019-02-24 MED ORDER — ALBUMIN HUMAN 5 % IV SOLN
250.0000 mL | INTRAVENOUS | Status: AC | PRN
  Administered 2019-02-24 (×3): 12.5 g via INTRAVENOUS
  Filled 2019-02-24: qty 500

## 2019-02-24 MED ORDER — BISACODYL 5 MG PO TBEC: 10.0000 mg | DELAYED_RELEASE_TABLET | Freq: Every day | ORAL | Status: DC

## 2019-02-24 MED ORDER — PROTAMINE SULFATE 10 MG/ML IV SOLN
INTRAVENOUS | Status: DC | PRN
  Administered 2019-02-24 (×4): 50 mg via INTRAVENOUS
  Administered 2019-02-24 (×2): 30 mg via INTRAVENOUS

## 2019-02-24 MED ORDER — VANCOMYCIN HCL IN DEXTROSE 1-5 GM/200ML-% IV SOLN: 1000.0000 mg | Freq: Once | INTRAVENOUS | Status: DC

## 2019-02-24 MED ORDER — ROCURONIUM BROMIDE 10 MG/ML (PF) SYRINGE
PREFILLED_SYRINGE | INTRAVENOUS | Status: AC
  Filled 2019-02-24: qty 40

## 2019-02-24 MED ORDER — LACTATED RINGERS IV SOLN: 500.0000 mL | Freq: Once | INTRAVENOUS | Status: DC | PRN

## 2019-02-24 MED ORDER — ASPIRIN EC 325 MG PO TBEC
325.0000 mg | DELAYED_RELEASE_TABLET | Freq: Every day | ORAL | Status: DC
  Filled 2019-02-24: qty 1

## 2019-02-24 MED ORDER — MIDAZOLAM HCL 5 MG/5ML IJ SOLN
INTRAMUSCULAR | Status: DC | PRN
  Administered 2019-02-24: .5 mg via INTRAVENOUS
  Administered 2019-02-24 (×2): 1 mg via INTRAVENOUS
  Administered 2019-02-24: .5 mg via INTRAVENOUS
  Administered 2019-02-24 (×2): 1 mg via INTRAVENOUS

## 2019-02-24 MED ORDER — PHENYLEPHRINE HCL (PRESSORS) 10 MG/ML IV SOLN
INTRAVENOUS | Status: AC
  Filled 2019-02-24: qty 1

## 2019-02-24 MED ORDER — EPHEDRINE SULFATE 50 MG/ML IJ SOLN
INTRAMUSCULAR | Status: DC | PRN
  Administered 2019-02-24 (×2): 10 mg via INTRAVENOUS

## 2019-02-24 MED ORDER — SODIUM CHLORIDE 0.9 % IV SOLN
1.5000 g | INTRAVENOUS | Status: AC
  Administered 2019-02-25 – 2019-02-26 (×2): 1.5 g via INTRAVENOUS
  Filled 2019-02-24 (×2): qty 1.5

## 2019-02-24 MED ORDER — DEXMEDETOMIDINE HCL IN NACL 400 MCG/100ML IV SOLN
0.0000 ug/kg/h | INTRAVENOUS | Status: DC
  Administered 2019-02-24: 0.7 ug/kg/h via INTRAVENOUS
  Filled 2019-02-24 (×2): qty 100

## 2019-02-24 MED ORDER — PHENYLEPHRINE HCL-NACL 20-0.9 MG/250ML-% IV SOLN
0.0000 ug/min | INTRAVENOUS | Status: DC
  Administered 2019-02-24: 90 ug/min via INTRAVENOUS
  Filled 2019-02-24: qty 250

## 2019-02-24 MED ORDER — ORAL CARE MOUTH RINSE
15.0000 mL | OROMUCOSAL | Status: DC
  Administered 2019-02-24 – 2019-02-26 (×12): 15 mL via OROMUCOSAL

## 2019-02-24 MED ORDER — ASPIRIN 81 MG PO CHEW: 324.0000 mg | CHEWABLE_TABLET | Freq: Every day | ORAL | Status: DC

## 2019-02-24 MED ORDER — SODIUM CHLORIDE 0.9 % IV SOLN
1.5000 g | Freq: Two times a day (BID) | INTRAVENOUS | Status: DC
  Administered 2019-02-24: 1.5 g via INTRAVENOUS
  Filled 2019-02-24 (×2): qty 1.5

## 2019-02-24 MED ORDER — SODIUM CHLORIDE 0.9% FLUSH
10.0000 mL | Freq: Two times a day (BID) | INTRAVENOUS | Status: DC
  Administered 2019-02-25 – 2019-03-05 (×12): 10 mL
  Administered 2019-03-06: 20 mL
  Administered 2019-03-06: 10 mL
  Administered 2019-03-07: 30 mL
  Administered 2019-03-08: 13:00:00 40 mL
  Administered 2019-03-10: 10 mL

## 2019-02-24 MED ORDER — SODIUM CHLORIDE 0.9% FLUSH
10.0000 mL | INTRAVENOUS | Status: DC | PRN
  Administered 2019-03-02: 10 mL

## 2019-02-24 MED ORDER — ACETAMINOPHEN 160 MG/5ML PO SOLN: 1000.0000 mg | Freq: Four times a day (QID) | ORAL | Status: DC

## 2019-02-24 MED ORDER — LACTATED RINGERS IV SOLN: INTRAVENOUS | Status: DC

## 2019-02-24 MED ORDER — SODIUM CHLORIDE 0.9% FLUSH
3.0000 mL | Freq: Two times a day (BID) | INTRAVENOUS | Status: DC
  Administered 2019-02-27 – 2019-03-03 (×8): 3 mL via INTRAVENOUS
  Administered 2019-03-04: 10:00:00 7 mL via INTRAVENOUS
  Administered 2019-03-04 – 2019-03-12 (×11): 3 mL via INTRAVENOUS

## 2019-02-24 MED ORDER — FENTANYL CITRATE (PF) 250 MCG/5ML IJ SOLN
INTRAMUSCULAR | Status: DC | PRN
  Administered 2019-02-24: 25 ug via INTRAVENOUS
  Administered 2019-02-24 (×2): 150 ug via INTRAVENOUS
  Administered 2019-02-24: 25 ug via INTRAVENOUS
  Administered 2019-02-24: 50 ug via INTRAVENOUS
  Administered 2019-02-24: 400 ug via INTRAVENOUS

## 2019-02-24 MED ORDER — ACETAMINOPHEN 160 MG/5ML PO SOLN: 650.0000 mg | Freq: Once | ORAL | Status: AC

## 2019-02-24 MED ORDER — HEMOSTATIC AGENTS (NO CHARGE) OPTIME
TOPICAL | Status: DC | PRN
  Administered 2019-02-24 (×3): 1 via TOPICAL

## 2019-02-24 MED ORDER — INSULIN REGULAR(HUMAN) IN NACL 100-0.9 UT/100ML-% IV SOLN: INTRAVENOUS | Status: DC

## 2019-02-24 MED ORDER — CALCIUM CHLORIDE 10 % IV SOLN
INTRAVENOUS | Status: AC
  Filled 2019-02-24: qty 10

## 2019-02-24 MED ORDER — TRAMADOL HCL 50 MG PO TABS
50.0000 mg | ORAL_TABLET | Freq: Two times a day (BID) | ORAL | Status: DC | PRN
  Filled 2019-02-24: qty 1

## 2019-02-24 MED ORDER — SODIUM CHLORIDE 0.9 % IV SOLN
INTRAVENOUS | Status: DC | PRN
  Administered 2019-02-24: 750 mg via INTRAVENOUS

## 2019-02-24 SURGICAL SUPPLY — 112 items
ADAPTER MULTI PERFUSION 15 (ADAPTER) ×3 IMPLANT
ATRICLIP EXCLUSION VLAA 45 (Miscellaneous) ×2 IMPLANT
ATRICLIP EXCLUSION VLAA 45MM (Miscellaneous) ×1 IMPLANT
BAG DECANTER FOR FLEXI CONT (MISCELLANEOUS) ×3 IMPLANT
BASKET HEART  (ORDER IN 25'S) (MISCELLANEOUS) ×1
BASKET HEART (ORDER IN 25'S) (MISCELLANEOUS) ×1
BASKET HEART (ORDER IN 25S) (MISCELLANEOUS) ×1 IMPLANT
BLADE CLIPPER SURG (BLADE) ×3 IMPLANT
BLADE STERNUM SYSTEM 6 (BLADE) ×3 IMPLANT
BLADE SURG 11 STRL SS (BLADE) ×3 IMPLANT
BLOOD HAEMOCONCENTR 700 MIDI (MISCELLANEOUS) ×3 IMPLANT
BLOWER MISTER CAL-MED (MISCELLANEOUS) ×3 IMPLANT
BNDG ELASTIC 4X5.8 VLCR STR LF (GAUZE/BANDAGES/DRESSINGS) ×6 IMPLANT
BNDG ELASTIC 6X5.8 VLCR STR LF (GAUZE/BANDAGES/DRESSINGS) ×6 IMPLANT
BNDG GAUZE ELAST 4 BULKY (GAUZE/BANDAGES/DRESSINGS) ×6 IMPLANT
CABLE SURGICAL S-101-97-12 (CABLE) ×3 IMPLANT
CANISTER SUCT 3000ML PPV (MISCELLANEOUS) ×3 IMPLANT
CANNULA AORTIC ROOT 9FR (CANNULA) ×3 IMPLANT
CANNULA EZ GLIDE 8.0 24FR (CANNULA) ×3 IMPLANT
CANNULA MC2 2 STG 29/37 NON-V (CANNULA) ×1 IMPLANT
CANNULA MC2 TWO STAGE (CANNULA) ×2
CANNULA VESSEL 3MM BLUNT TIP (CANNULA) ×6 IMPLANT
CATH ROBINSON RED A/P 18FR (CATHETERS) ×6 IMPLANT
CLIP RETRACTION 3.0MM CORONARY (MISCELLANEOUS) IMPLANT
CLIP VESOCCLUDE MED 24/CT (CLIP) IMPLANT
CLIP VESOCCLUDE SM WIDE 24/CT (CLIP) ×3 IMPLANT
CONN ST 1/2X1/2  BEN (MISCELLANEOUS)
CONN ST 1/2X1/2 BEN (MISCELLANEOUS) IMPLANT
CONNECTOR BLAKE 2:1 CARIO BLK (MISCELLANEOUS) ×3 IMPLANT
COVER WAND RF STERILE (DRAPES) IMPLANT
DERMABOND ADVANCED (GAUZE/BANDAGES/DRESSINGS) ×6
DERMABOND ADVANCED .7 DNX12 (GAUZE/BANDAGES/DRESSINGS) ×3 IMPLANT
DEVICE EXCLUSIN ATRCLP VLAA 45 (Miscellaneous) ×1 IMPLANT
DRAIN CHANNEL 19F RND (DRAIN) ×3 IMPLANT
DRAIN CONNECTOR BLAKE 1:1 (MISCELLANEOUS) IMPLANT
DRAPE CARDIOVASCULAR INCISE (DRAPES) ×2
DRAPE INCISE IOBAN 66X45 STRL (DRAPES) IMPLANT
DRAPE SLUSH/WARMER DISC (DRAPES) ×3 IMPLANT
DRAPE SRG 135X102X78XABS (DRAPES) ×1 IMPLANT
DRSG COVADERM 4X14 (GAUZE/BANDAGES/DRESSINGS) ×3 IMPLANT
ELECT BLADE 4.0 EZ CLEAN MEGAD (MISCELLANEOUS) ×3
ELECT REM PT RETURN 9FT ADLT (ELECTROSURGICAL) ×6
ELECTRODE BLDE 4.0 EZ CLN MEGD (MISCELLANEOUS) ×1 IMPLANT
ELECTRODE REM PT RTRN 9FT ADLT (ELECTROSURGICAL) ×2 IMPLANT
FELT TEFLON 1X6 (MISCELLANEOUS) ×3 IMPLANT
GAUZE SPONGE 4X4 12PLY STRL (GAUZE/BANDAGES/DRESSINGS) ×6 IMPLANT
GAUZE SPONGE 4X4 12PLY STRL LF (GAUZE/BANDAGES/DRESSINGS) ×9 IMPLANT
GLOVE BIO SURGEON STRL SZ 6.5 (GLOVE) ×14 IMPLANT
GLOVE BIO SURGEON STRL SZ7 (GLOVE) ×6 IMPLANT
GLOVE BIO SURGEON STRL SZ8.5 (GLOVE) ×3 IMPLANT
GLOVE BIO SURGEONS STRL SZ 6.5 (GLOVE) ×7
GLOVE BIOGEL M STRL SZ7.5 (GLOVE) ×6 IMPLANT
GLOVE BIOGEL PI IND STRL 6 (GLOVE) ×1 IMPLANT
GLOVE BIOGEL PI IND STRL 6.5 (GLOVE) ×2 IMPLANT
GLOVE BIOGEL PI IND STRL 7.0 (GLOVE) ×1 IMPLANT
GLOVE BIOGEL PI IND STRL 7.5 (GLOVE) ×1 IMPLANT
GLOVE BIOGEL PI INDICATOR 6 (GLOVE) ×2
GLOVE BIOGEL PI INDICATOR 6.5 (GLOVE) ×4
GLOVE BIOGEL PI INDICATOR 7.0 (GLOVE) ×2
GLOVE BIOGEL PI INDICATOR 7.5 (GLOVE) ×2
GOWN STRL REUS W/ TWL LRG LVL3 (GOWN DISPOSABLE) ×9 IMPLANT
GOWN STRL REUS W/ TWL XL LVL3 (GOWN DISPOSABLE) ×1 IMPLANT
GOWN STRL REUS W/TWL LRG LVL3 (GOWN DISPOSABLE) ×18
GOWN STRL REUS W/TWL XL LVL3 (GOWN DISPOSABLE) ×2
HEMOSTAT POWDER SURGIFOAM 1G (HEMOSTASIS) ×9 IMPLANT
KIT BASIN OR (CUSTOM PROCEDURE TRAY) ×3 IMPLANT
KIT DRAINAGE VACCUM ASSIST (KITS) IMPLANT
KIT SUCTION CATH 14FR (SUCTIONS) ×3 IMPLANT
KIT TURNOVER KIT B (KITS) ×3 IMPLANT
KIT VASOVIEW HEMOPRO 2 VH 4000 (KITS) ×3 IMPLANT
LEAD PACING MYOCARDI (MISCELLANEOUS) IMPLANT
MARKER GRAFT CORONARY BYPASS (MISCELLANEOUS) ×9 IMPLANT
NS IRRIG 1000ML POUR BTL (IV SOLUTION) ×15 IMPLANT
OFFPUMP STABILIZER SUV (MISCELLANEOUS) ×3 IMPLANT
PACK E OPEN HEART (SUTURE) ×3 IMPLANT
PACK OPEN HEART (CUSTOM PROCEDURE TRAY) ×3 IMPLANT
PAD ARMBOARD 7.5X6 YLW CONV (MISCELLANEOUS) ×6 IMPLANT
PAD ELECT DEFIB RADIOL ZOLL (MISCELLANEOUS) ×3 IMPLANT
PENCIL BUTTON HOLSTER BLD 10FT (ELECTRODE) ×3 IMPLANT
POSITIONER ACROBAT-I OFFPUMP (MISCELLANEOUS) ×3 IMPLANT
POSITIONER HEAD DONUT 9IN (MISCELLANEOUS) ×3 IMPLANT
PUNCH AORTIC ROT 4.0MM RCL 40 (MISCELLANEOUS) ×3 IMPLANT
PUNCH AORTIC ROTATE 4.0MM (MISCELLANEOUS) IMPLANT
SET CARDIOPLEGIA MPS 5001102 (MISCELLANEOUS) ×3 IMPLANT
SPONGE LAP 18X18 X RAY DECT (DISPOSABLE) ×12 IMPLANT
SUPPORT HEART JANKE-BARRON (MISCELLANEOUS) ×3 IMPLANT
SUT BONE WAX W31G (SUTURE) ×3 IMPLANT
SUT ETHIBOND X763 2 0 SH 1 (SUTURE) ×6 IMPLANT
SUT MNCRL AB 3-0 PS2 18 (SUTURE) ×6 IMPLANT
SUT MNCRL AB 4-0 PS2 18 (SUTURE) ×6 IMPLANT
SUT PDS AB 1 CTX 36 (SUTURE) ×6 IMPLANT
SUT PROLENE 4 0 SH DA (SUTURE) ×3 IMPLANT
SUT PROLENE 5 0 C 1 36 (SUTURE) ×24 IMPLANT
SUT PROLENE 7 0 BV 1 (SUTURE) ×6 IMPLANT
SUT PROLENE 7 0 BV1 MDA (SUTURE) ×12 IMPLANT
SUT STEEL 6MS V (SUTURE) ×6 IMPLANT
SUT VIC AB 2-0 CT1 27 (SUTURE) ×2
SUT VIC AB 2-0 CT1 TAPERPNT 27 (SUTURE) ×1 IMPLANT
SUT VIC AB 3-0 SH 27 (SUTURE) ×2
SUT VIC AB 3-0 SH 27X BRD (SUTURE) ×1 IMPLANT
SYSTEM SAHARA CHEST DRAIN ATS (WOUND CARE) ×3 IMPLANT
TAPE CLOTH SURG 4X10 WHT LF (GAUZE/BANDAGES/DRESSINGS) ×3 IMPLANT
TAPE PAPER 2X10 WHT MICROPORE (GAUZE/BANDAGES/DRESSINGS) ×3 IMPLANT
TAPES RETRACTO (MISCELLANEOUS) ×3 IMPLANT
TOWEL GREEN STERILE (TOWEL DISPOSABLE) ×3 IMPLANT
TOWEL GREEN STERILE FF (TOWEL DISPOSABLE) IMPLANT
TRAY FOLEY SLVR 16FR TEMP STAT (SET/KITS/TRAYS/PACK) ×3 IMPLANT
TUBE SUCT INTRACARD DLP 20F (MISCELLANEOUS) ×3 IMPLANT
TUBE SUCTION CARDIAC 10FR (CANNULA) IMPLANT
TUBING LAP HI FLOW INSUFFLATIO (TUBING) ×3 IMPLANT
UNDERPAD 30X30 (UNDERPADS AND DIAPERS) ×3 IMPLANT
WATER STERILE IRR 1000ML POUR (IV SOLUTION) ×6 IMPLANT

## 2019-02-24 NOTE — Progress Notes (Signed)
PHARMACY NOTE:  ANTIMICROBIAL RENAL DOSAGE ADJUSTMENT  Current antimicrobial regimen includes a mismatch between antimicrobial dosage and estimated renal function.  As per policy approved by the Pharmacy & Therapeutics and Medical Executive Committees, the antimicrobial dosage will be adjusted accordingly.  Current antimicrobial dosage:  Vancomycin 1000mg  x1 (post-op) Indication: surgical prophylaxis  Renal Function:  Estimated Creatinine Clearance: 17.5 mL/min (A) (by C-G formula based on SCr of 3.2 mg/dL (H)). [x]      On intermittent HD, scheduled     Antimicrobial dosage has been changed to:  No further doses needed (vancomycin 1250mg  IV given about 8am and will cover for more than 24 hours)  Additional comments:   Thank you for allowing pharmacy to be a part of this patient's care.  Hildred Laser, PharmD Clinical Pharmacist **Pharmacist phone directory can now be found on [email or web address] ([email or web address]).  Listed under [place].

## 2019-02-24 NOTE — Progress Notes (Signed)
[facility] Progress Note   Subjective:  Seen in ICU. Underwent CAGB x 4 this am. Had dialysis last night with 1L UF.   Objective Vitals:   02/05/2019 0400 01/30/2019 0440 02/25/2019 1504 02/09/2019 1600  BP: 110/80 120/80 (!) 124/51 (!) 116/51  Pulse: 70 72 66 (!) 57  Resp: '18 18 12 12  ' Temp:  98 F (36.7 C)  (!) 96.1 F (35.6 C)  TempSrc:  Oral    SpO2:  98% 98% 100%  Weight:  73 kg    Height:        Physical Exam General: Intubated, sedated  Heart: RRR systolic murmur  Lungs: Clear bilaterally  Abdomen: soft non-tender  Extremities: No sig LE edema  Dialysis Access: LUE AVF +bruit    Weight change: -0.843 kg   Additional Objective Labs: Basic Metabolic Panel: Recent Labs  Lab 02/13/2019 1736 02/05/2019 0545 02/06/2019 1150 02/21/2019 1249 02/21/2019 1415 02/15/2019 1533  NA 142 140 143 143 145 146*  K 3.8 4.5 4.5 4.9 4.1 4.7  CL 103 102 106 110 112*  --   CO2 25 24  --   --   --   --   GLUCOSE 233* 95 112* 115* 89  --   BUN 34* '16 18 18 17  ' --   CREATININE 6.23* 3.61* 3.50* 3.40* 3.20*  --   CALCIUM 9.5 8.7*  --   --   --   --   PHOS  --  2.9  --   --   --   --    CBC: Recent Labs  Lab 02/02/2019 1736 02/20/2019 0549 01/27/2019 0545 01/31/2019 1154 01/26/2019 1249 02/01/2019 1415 01/28/2019 1533  WBC 5.5 5.0 6.2  --   --   --   --   NEUTROABS 3.8  --  4.3  --   --   --   --   HGB 12.2* 12.4* 11.6* 7.7* 6.8* 8.2* 8.2*  HCT 38.9* 36.7* 36.0* 23.9* 20.0* 24.0* 24.0*  MCV 99.0 99.7 96.5  --   --   --   --   PLT PLATELET CLUMPS NOTED ON SMEAR, COUNT APPEARS DECREASED 123* 118* 89*  --   --   --    Blood Culture    Component Value Date/Time   SDES URINE, CLEAN CATCH 05/13/2015 1932   SPECREQUEST NONE 05/13/2015 1932   CULT NO GROWTH 2 DAYS 05/13/2015 1932   REPTSTATUS 05/15/2015 FINAL 05/13/2015 1932     Medications: . sodium chloride    . [START ON 02/25/2019] sodium chloride    . sodium chloride    . sodium chloride 20 mL/hr at 02/04/2019 1600  .  albumin human    . cefUROXime (ZINACEF)  IV 1.5 g (01/27/2019 1606)  . dexmedetomidine (PRECEDEX) IV infusion 0.4 mcg/kg/hr (02/07/2019 1600)  . famotidine (PEPCID) IV    . insulin 0.5 mL/hr at 02/08/2019 1600  . lactated ringers    . lactated ringers    . magnesium sulfate    . niCARDipine Stopped (02/05/2019 1558)  . nitroGLYCERIN Stopped (02/20/2019 1543)  . phenylephrine (NEO-SYNEPHRINE) Adult infusion 20 mcg/min (02/01/2019 1600)  . potassium chloride     . [START ON 02/25/2019] acetaminophen  1,000 mg Oral Q6H   Or  . [START ON 02/25/2019] acetaminophen (TYLENOL) oral liquid 160 mg/5 mL  1,000 mg Per Tube Q6H  . [START ON 02/25/2019] aspirin EC  325 mg Oral Daily   Or  . [START ON 02/25/2019] aspirin  324  mg Per Tube Daily  . [START ON 02/25/2019] bisacodyl  10 mg Oral Daily   Or  . [START ON 02/25/2019] bisacodyl  10 mg Rectal Daily  . calcium acetate  1,334 mg Oral TID WC  . chlorhexidine gluconate (MEDLINE KIT)  15 mL Mouth Rinse BID  . Chlorhexidine Gluconate Cloth  6 each Topical Q0600  . [START ON 02/25/2019] docusate sodium  200 mg Oral Daily  . mouth rinse  15 mL Mouth Rinse 10 times per day  . metoprolol tartrate  12.5 mg Oral BID   Or  . metoprolol tartrate  12.5 mg Per Tube BID  . mupirocin ointment  1 application Nasal BID  . [START ON 02/26/2019] pantoprazole  40 mg Oral Daily  . simvastatin  40 mg Oral q1800  . sodium chloride flush  10-40 mL Intracatheter Q12H  . [START ON 02/25/2019] sodium chloride flush  3 mL Intravenous Q12H    Dialysis Orders:  [facility] TTS 4h 400/800 EDW 73.5kg 3K/2.25Ca  UFP 4  AVF No heparin Hectorol 4 Venofer 50 Mircera 75 (last 12/15)   Assessment/Plan: 1. New onset atrial fibrillation/CAD s/p stents/Elevated troponin. LHC with severe multivessel CAD.  s/p CABG x 4 this am.  2. ESRD -  HD TTS. Dialyzed 12.29. Next HD Thursday or Friday. Using added K+ bath  3. Hypertension/volume  -  BP/volume stable. UF to dry weight as tolerated   4. Anemia  - Hgb 12 > 8.2 post op. Resume ESA with next HD.  5. Metabolic bone disease -  Continue usual binders/Hectorol  6. Nutrition - Renal diet/vitamins   Lynnda Child PA-C [facility] Pager [phone] 02/05/2019,4:45 PM  LOS: 2 days

## 2019-02-24 NOTE — Anesthesia Postprocedure Evaluation (Signed)
Anesthesia Post Note  Patient: Robert [name].  Procedure(s) Performed: CORONARY ARTERY BYPASS GRAFTING (CABG) using endoscopic greater saphenous vein harvest: SVC to Diag1; SVC to OM1; SVC to PDA and LIMA to LAD. (N/A Chest) Clipping Of Atrial Appendage using 45 MM AtriCure AtriClip (N/A Chest)     Patient location during evaluation: PACU Anesthesia Type: General Level of consciousness: sedated and patient remains intubated per anesthesia plan Pain management: pain level controlled Vital Signs Assessment: post-procedure vital signs reviewed and stable Respiratory status: patient remains intubated per anesthesia plan Cardiovascular status: stable Anesthetic complications: no    Last Vitals:  Vitals:   01/31/2019 1700 02/23/2019 1800  BP: (!) 164/67 (!) 108/50  Pulse: 63 (!) 58  Resp: 19 (!) 23  Temp: (!) 35.5 C (!) 35.6 C  SpO2: 100% 100%    Last Pain:  Vitals:   02/23/2019 1600  TempSrc: Core  PainSc:                  Nolon Nations

## 2019-02-24 NOTE — Progress Notes (Signed)
  Echocardiogram Echocardiogram Transesophageal has been performed.  Robert [name] 02/21/2019, 8:50 AM

## 2019-02-24 NOTE — Anesthesia Procedure Notes (Signed)
Central Venous Catheter Insertion Performed by: Audry Pili, MD, anesthesiologist Start/End12/24/2020 7:16 AM, 02/24/2019 7:19 AM Patient location: Pre-op. Preanesthetic checklist: patient identified, IV checked, risks and benefits discussed, surgical consent, monitors and equipment checked, pre-op evaluation, timeout performed and anesthesia consent Position: Trendelenburg Hand hygiene performed  and maximum sterile barriers used  Total catheter length 10. PA cath was placed.Swan type:thermodilution PA Cath depth:55 Procedure performed without using ultrasound guided technique. Attempts: 1 Patient tolerated the procedure well with no immediate complications.

## 2019-02-24 NOTE — Progress Notes (Signed)
     [address]       [place] [address]             [phone]       No events Removed 1L on dialysis last night P2Y12 201 CT chest shows no significant calcification on ascending  OR today CABG 4, atriclip.  Will follow aortic valve.

## 2019-02-24 NOTE — Progress Notes (Signed)
Patient transported to pre-op area.

## 2019-02-24 NOTE — Anesthesia Procedure Notes (Signed)
Procedure Name: Intubation Date/Time: 02/25/2019 8:09 AM Performed by: Ireoluwa Grant T, CRNA Pre-anesthesia Checklist: Patient identified, Emergency Drugs available, Suction available and Patient being monitored Patient Re-evaluated:Patient Re-evaluated prior to induction Oxygen Delivery Method: Circle system utilized Preoxygenation: Pre-oxygenation with 100% oxygen Induction Type: IV induction Ventilation: Mask ventilation without difficulty Laryngoscope Size: Miller and 3 Grade View: Grade I Tube type: Oral Tube size: 8.0 mm Number of attempts: 1 Airway Equipment and Method: Stylet and Oral airway Placement Confirmation: ETT inserted through vocal cords under direct vision,  positive ETCO2 and breath sounds checked- equal and bilateral Secured at: 23 cm Tube secured with: Tape Dental Injury: Teeth and Oropharynx as per pre-operative assessment

## 2019-02-24 NOTE — Progress Notes (Signed)
Subjective:  Going for CABG no angina   Objective:  Vitals:   02/02/2019 0305 02/10/2019 0330 02/12/2019 0400 01/29/2019 0440  BP: 110/80 129/84 110/80 120/80  Pulse: 66 71 70 72  Resp: 20 20 18 18   Temp:    98 F (36.7 C)  TempSrc:    Oral  SpO2:    98%  Weight:    73 kg  Height:        Intake/Output from previous day:  Intake/Output Summary (Last 24 hours) at 02/18/2019 0800 Last data filed at 01/28/2019 0440 Gross per 24 hour  Intake 18.53 ml  Output 1000 ml  Net -981.47 ml    Physical Exam: Affect appropriate Healthy:  appears stated age HEENT: right CEA  Neck supple with no adenopathy JVP normal no bruits no thyromegaly Lungs clear with no wheezing and good diaphragmatic motion Heart:  S1/S2 AS murmur, no rub, gallop or click PMI normal Abdomen: benighn, BS positve, no tenderness, no AAA no bruit.  No HSM or HJR Dialysis fistula LUE  No edema Neuro non-focal Skin warm and dry No muscular weakness   Lab Results: Basic Metabolic Panel: Recent Labs    02/02/2019 1736 02/07/2019 0545  NA 142 140  K 3.8 4.5  CL 103 102  CO2 25 24  GLUCOSE 233* 95  BUN 34* 16  CREATININE 6.23* 3.61*  CALCIUM 9.5 8.7*  MG  --  2.0  PHOS  --  2.9   Liver Function Tests: Recent Labs    02/01/2019 1736 02/25/2019 0545  AST 18  --   ALT 19  --   ALKPHOS 87  --   BILITOT 0.4  --   PROT 6.5  --   ALBUMIN 3.5 3.3*   No results for input(s): LIPASE, AMYLASE in the last 72 hours. CBC: Recent Labs    02/07/2019 1736 02/19/2019 0549 02/19/2019 0545  WBC 5.5 5.0 6.2  NEUTROABS 3.8  --  4.3  HGB 12.2* 12.4* 11.6*  HCT 38.9* 36.7* 36.0*  MCV 99.0 99.7 96.5  PLT PLATELET CLUMPS NOTED ON SMEAR, COUNT APPEARS DECREASED 123* 118*   Cardiac Enzymes: No results for input(s): CKTOTAL, CKMB, CKMBINDEX, TROPONINI in the last 72 hours. BNP: Invalid input(s): POCBNP D-Dimer: No results for input(s): DDIMER in the last 72 hours. Hemoglobin A1C: Recent Labs    02/07/2019 2100    HGBA1C 7.2*   Fasting Lipid Panel: Recent Labs    02/13/2019 0545  CHOL 112  HDL 29*  LDLCALC 52  TRIG 157*  CHOLHDL 3.9   Thyroid Function Tests: Recent Labs    02/22/2019 1400  TSH 3.084   Anemia Panel: No results for input(s): VITAMINB12, FOLATE, FERRITIN, TIBC, IRON, RETICCTPCT in the last 72 hours.  Imaging: CT CHEST WO CONTRAST  Result Date: 01/30/2019 CLINICAL DATA:  Aortic disease, nontraumatic eval for ascending aortic calcification EXAM: CT CHEST WITHOUT CONTRAST TECHNIQUE: Multidetector CT imaging of the chest was performed following the standard protocol without IV contrast. COMPARISON:  Radiograph yesterday. FINDINGS: Cardiovascular: Aortic valvular and mitral annulus calcifications. Coronary artery calcifications. Minimal atherosclerotic calcifications of the ascending aorta at the sinotubular junction. Trace aortic calcifications in the mid ascending aorta. Moderate atherosclerotic calcifications of the transverse and descending aorta. Conventional branching pattern from the aortic arch. No aortic aneurysm. No periaortic stranding. Upper normal heart size. No pericardial effusion. Mediastinum/Nodes: Shotty mediastinal lymph nodes with 12 mm left lower paratracheal node, series 3, image 52. prominent prevascular node measuring 8 mm short axis.  Limited assessment for hilar adenopathy given lack of IV contrast. Decompressed esophagus with small hiatal hernia. No visualized thyroid nodule. Lungs/Pleura: Moderate emphysema. Moderate central bronchial thickening. Minimal mucus within the right middle lobe bronchus. Small bilateral pleural effusions with adjacent compressive atelectasis. Calcified granuloma in the right and left lower lobes. Tiny subpleural left upper lobe pulmonary nodule, series 4, image 37. No septal thickening or pulmonary edema. Upper Abdomen: No acute findings. Atherosclerosis of upper abdominal vasculature. Musculoskeletal: Remote mid sternal fracture. Implanted  loop recorder in the left chest wall. Remote right anterior rib fractures. No acute osseous abnormality. IMPRESSION: 1. Thoracic aortic atherosclerosis, minimal/mild involving the ascending aorta. No aortic aneurysm. 2. Aortic valvular, mitral annulus, and coronary artery calcifications. 3. Small bilateral pleural effusions. 4. Emphysema with central bronchial thickening. 5. Shotty mediastinal lymph nodes, likely reactive, largest node measures 12 mm in the left lower paratracheal station. 6. Tiny subpleural nodule in the left upper lobe. No follow-up needed if patient is low-risk. Non-contrast chest CT can be considered in 12 months if patient is high-risk. This recommendation follows the consensus statement: Guidelines for Management of Incidental Pulmonary Nodules Detected on CT Images: From the Fleischner Society 2017; Radiology 2017; 284:228-243. Aortic Atherosclerosis (ICD10-I70.0) and Emphysema (ICD10-J43.9). Electronically Signed   By: Keith Rake M.D.   On: 02/13/2019 23:34   CARDIAC CATHETERIZATION  Result Date: 01/31/2019  Dist LM to Prox LAD lesion is 90% stenosed.  Ost Cx to Prox Cx lesion is 95% stenosed.  Mid LAD lesion is 80% stenosed - at stent overlap site.  1st Diag-1 lesion is 55% stenosed. 1st Diag-2 lesion is 55% stenosed.  Prox RCA to Mid RCA - previously placed DES is 20% in-stent re-stenosed.  Dist RCA lesion is 70% stenosed proximal to bifucation.  LV end diastolic pressure is moderately elevated.  There is moderate aortic valve stenosis.  SUMMARY  Severe Calcified distal LM-Ostial LAD & LCX ([place] 1,1,1,) 90-95% stenosis  ~80% in-stent restenosis of LAD overlapping DES; ostial large D1 60% with prox ~60%.  Mild ~20% ISR of mRCA stent with distal RCA tubular 70%.  Calcified Aortic Valve with relatively low Gradient noted on Vavle pull-back gradient. - Suspect more Moderate to Mild-Moderate AS as opposed to Mod-Severe AS.  Good downstream LAD, D1, Cx-OM1 & rPDA  targets. RECOMMENDATIONS  Best Revascularization option is CABG - Dr. Kipp Brood from [facility] has been consulted -- need to check P2Y12 Inhibition assay & await Plavix washout (las dose Sunday 12/27)  Will transfer to CCU for closer monitoring - & to allow for HD to be done in the CCU  Closely monitor BP  Restart IV Heparin 8 hr after sheath removal. Glenetta Hew, MD   DG Chest [facility] 1 View  Result Date: 02/13/2019 CLINICAL DATA:  Weakness. Body aches. Chest pain. Shortness of breath. EXAM: PORTABLE CHEST 1 VIEW COMPARISON:  01/10/2019 FINDINGS: Numerous leads and wires project over the chest. Loop recorder. Apical lordotic positioning. Mild cardiomegaly. Atherosclerosis in the transverse aorta. No pleural effusion or pneumothorax. Lucency at the apices, suggesting emphysema. No lobar consolidation. No congestive failure. IMPRESSION: Cardiomegaly, without acute disease. Aortic Atherosclerosis (ICD10-I70.0). Electronically Signed   By: Abigail Miyamoto M.D.   On: 02/13/2019 18:13   ECHOCARDIOGRAM COMPLETE  Result Date: 02/04/2019   ECHOCARDIOGRAM REPORT   Patient Name:   Robert [name]. Date of Exam: 01/26/2019 Medical Rec #:  [id number]            Height:  68.0 in Accession #:    [id number]           Weight:       162.5 lb Date of Birth:  Dec 31, 1935            BSA:          1.87 m Patient Age:    83 years             BP:           145/70 mmHg Patient Gender: M                    HR:           62 bpm. Exam Location:  Inpatient Procedure: 2D Echo Indications:    Atrial Fibrillation 427.31 / I48.91  History:        Patient has prior history of Echocardiogram examinations, most                 recent 06/23/2018. CHF, CAD; Risk Factors:Diabetes, Hypertension                 and Dyslipidemia. CKD                 ESRD.  Sonographer:    Vikki Ports Turrentine Referring Phys: [place]  1. Left ventricular ejection fraction, by visual estimation, is 60 to 65%. The left ventricle has normal  function. There is no left ventricular hypertrophy.  2. Left ventricular diastolic parameters are consistent with Grade II diastolic dysfunction (pseudonormalization).  3. The left ventricle has no regional wall motion abnormalities.  4. Elevated LVEDP.  5. Global right ventricle has normal systolic function.The right ventricular size is normal. No increase in right ventricular wall thickness.  6. Left atrial size was severely dilated.  7. Right atrial size was moderately dilated.  8. The mitral valve is normal in structure. Mild mitral valve regurgitation. No evidence of mitral stenosis.  9. The tricuspid valve is normal in structure. 10. The aortic valve is normal in structure. Aortic valve regurgitation is not visualized. Moderate aortic valve stenosis. 11. There is severe calcifcation of the aortic valve. 12. There is severe thickening of the aortic valve. 13. The pulmonic valve was normal in structure. Pulmonic valve regurgitation is trivial. 14. Normal pulmonary artery systolic pressure. 15. The inferior vena cava is dilated in size with <50% respiratory variability, suggesting right atrial pressure of 15 mmHg. FINDINGS  Left Ventricle: Left ventricular ejection fraction, by visual estimation, is 60 to 65%. The left ventricle has normal function. The left ventricle has no regional wall motion abnormalities. The left ventricular internal cavity size was the left ventricle is normal in size. There is no left ventricular hypertrophy. Left ventricular diastolic parameters are consistent with Grade II diastolic dysfunction (pseudonormalization). Normal left atrial pressure. Elevated LVEDP. Right Ventricle: The right ventricular size is normal. No increase in right ventricular wall thickness. Global RV systolic function is has normal systolic function. The tricuspid regurgitant velocity is 1.57 m/s, and with an assumed right atrial pressure  of 15 mmHg, the estimated right ventricular systolic pressure is normal at  24.9 mmHg. Left Atrium: Left atrial size was severely dilated. Right Atrium: Right atrial size was moderately dilated Pericardium: There is no evidence of pericardial effusion. Mitral Valve: The mitral valve is normal in structure. Mild mitral valve regurgitation. No evidence of mitral valve stenosis by observation. Tricuspid Valve: The tricuspid valve is normal in structure. Tricuspid valve  regurgitation is mild. Aortic Valve: The aortic valve is normal in structure.. There is severe thickening and severe calcifcation of the aortic valve. Aortic valve regurgitation is not visualized. Moderate aortic stenosis is present. There is severe thickening of the aortic valve. There is severe calcifcation of the aortic valve. Aortic valve mean gradient measures 16.2 mmHg. Aortic valve peak gradient measures 26.3 mmHg. Aortic valve area, by VTI measures 0.94 cm. Pulmonic Valve: The pulmonic valve was normal in structure. Pulmonic valve regurgitation is trivial. Pulmonic regurgitation is trivial. Aorta: The aortic root, ascending aorta and aortic arch are all structurally normal, with no evidence of dilitation or obstruction. Venous: The inferior vena cava is dilated in size with less than 50% respiratory variability, suggesting right atrial pressure of 15 mmHg. IAS/Shunts: No atrial level shunt detected by color flow Doppler. There is no evidence of a patent foramen ovale. No ventricular septal defect is seen or detected. There is no evidence of an atrial septal defect. Additional Comments: Aortic valve stenosis is mild by mean gradient. However, visually it is severely calciffied and restricted. By planimetry, aortic valve area is 1.23 cm^2, consistent with moderate aortic stenosis.  LEFT VENTRICLE PLAX 2D LVIDd:         5.04 cm  Diastology LVIDs:         3.21 cm  LV e' lateral:   8.43 cm/s LV PW:         0.90 cm  LV E/e' lateral: 19.8 LV IVS:        0.93 cm  LV e' medial:    6.08 cm/s LVOT diam:     1.90 cm  LV E/e'  medial:  27.5 LV SV:         79 ml LV SV Index:   41.92 LVOT Area:     2.84 cm  RIGHT VENTRICLE RV S prime:     11.80 cm/s TAPSE (M-mode): 2.4 cm LEFT ATRIUM              Index       RIGHT ATRIUM           Index LA diam:        3.70 cm  1.98 cm/m  RA Area:     22.00 cm LA Vol (A2C):   105.0 ml 56.12 ml/m RA Volume:   67.00 ml  35.81 ml/m LA Vol (A4C):   101.0 ml 53.98 ml/m LA Biplane Vol: 104.0 ml 55.58 ml/m  AORTIC VALVE AV Area (Vmax):    1.05 cm AV Area (Vmean):   0.97 cm AV Area (VTI):     0.94 cm AV Vmax:           256.60 cm/s AV Vmean:          192.800 cm/s AV VTI:            0.651 m AV Peak Grad:      26.3 mmHg AV Mean Grad:      16.2 mmHg LVOT Vmax:         95.40 cm/s LVOT Vmean:        66.300 cm/s LVOT VTI:          0.217 m LVOT/AV VTI ratio: 0.33  AORTA Ao Root diam: 3.20 cm MITRAL VALVE                         TRICUSPID VALVE MV Area (PHT): 3.17 cm  TR Peak grad:   9.9 mmHg MV PHT:        69.31 msec            TR Vmax:        157.00 cm/s MV Decel Time: 239 msec MV E velocity: 167.00 cm/s 103 cm/s  SHUNTS MV A velocity: 124.00 cm/s 70.3 cm/s Systemic VTI:  0.22 m MV E/A ratio:  1.35        1.5       Systemic Diam: 1.90 cm  Skeet Latch MD Electronically signed by Skeet Latch MD Signature Date/Time: 02/10/2019/3:13:46 PM    Final     Cardiac Studies:  ECG:  Admission afib rapid rate diffuse ST deprssion    Telemetry:  NSR   Echo: EF 60-65% moderate AS mean gradient 16 mmHg  Medications:   . [MAR Hold] aspirin EC  81 mg Oral Daily  . [MAR Hold] calcium acetate  1,334 mg Oral TID WC  . [MAR Hold] Chlorhexidine Gluconate Cloth  6 each Topical Q0600  . Chlorhexidine Gluconate Cloth  6 each Topical Once  . epinephrine  0-10 mcg/min Intravenous To OR  . heparin-papaverine-plasmalyte irrigation   Irrigation To OR  . [MAR Hold] insulin aspart  0-9 Units Subcutaneous Q4H  . insulin   Intravenous To OR  . [place] Blood Cardioplegia vial  (lidocaine/magnesium/mannitol 0.26g-4g-6.4g)   Intracoronary Once  . [MAR Hold] levothyroxine  50 mcg Oral Q0600  . magnesium sulfate  40 mEq Other To OR  . [MAR Hold] metoprolol succinate  50 mg Oral BID  . [MAR Hold] pantoprazole  40 mg Oral QPM  . phenylephrine  30-200 mcg/min Intravenous To OR  . potassium chloride  80 mEq Other To OR  . [MAR Hold] simvastatin  40 mg Oral q1800  . [MAR Hold] sodium chloride flush  3 mL Intravenous Q12H  . [MAR Hold] sodium chloride flush  3 mL Intravenous Q12H  . tranexamic acid  15 mg/kg Intravenous To OR  . tranexamic acid  2 mg/kg Intracatheter To OR     . sodium chloride Stopped (02/10/2019 2127)  . [MAR Hold] sodium chloride    . cefUROXime (ZINACEF)  IV    . cefUROXime (ZINACEF)  IV    . dexmedetomidine    . heparin 30,000 units/NS 1000 mL solution for CELLSAVER    . heparin Stopped (02/23/2019 0538)  . milrinone    . nitroGLYCERIN    . norepinephrine    . tranexamic acid (CYKLOKAPRON) infusion ([facility])    . vancomycin      Assessment/Plan:   1. PAF:  Converted will need oral anticoagulation post CABG beta blocker 2. CAD:  LM and distal RCA EF preserved PSY not very high and agree with not waiting for wash out. PLTls on hold 3. AS:  Agree with age and co morbidities that AVR not indicated can follow AS and consider TAVR if needed at latter date   Jenkins Rouge 02/23/2019, 8:00 AM

## 2019-02-24 NOTE — Anesthesia Procedure Notes (Signed)
Arterial Line Insertion Start/End12/16/2020 8:20 AM, 02/24/2019 8:40 AM Performed by: Mariangel Ringley T, Immunologist, CRNA  Patient location: Pre-op. Preanesthetic checklist: patient identified, IV checked, risks and benefits discussed, surgical consent, pre-op evaluation and timeout performed Lidocaine 1% used for infiltration and patient sedated Right, radial was placed Catheter size: 20 G Hand hygiene performed  and maximum sterile barriers used   Attempts: 3 Procedure performed without using ultrasound guided technique. Following insertion, dressing applied. Post procedure assessment: normal  Patient tolerated the procedure well with no immediate complications.

## 2019-02-24 NOTE — Transfer of Care (Signed)
Immediate Anesthesia Transfer of Care Note  Patient: Robert [name].  Procedure(s) Performed: CORONARY ARTERY BYPASS GRAFTING (CABG) using endoscopic greater saphenous vein harvest: SVC to Diag1; SVC to OM1; SVC to PDA and LIMA to LAD. (N/A Chest) Clipping Of Atrial Appendage using 45 MM AtriCure AtriClip (N/A Chest)  Patient Location: PACU  Anesthesia Type:General  Level of Consciousness: Patient remains intubated per anesthesia plan  Airway & Oxygen Therapy: Patient remains intubated per anesthesia plan and Patient placed on Ventilator (see vital sign flow sheet for setting)  Post-op Assessment: Report given to RN and Post -op Vital signs reviewed and stable  Post vital signs: Reviewed and stable  Last Vitals:  Vitals Value Taken Time  BP    Temp    Pulse    Resp    SpO2      Last Pain:  Vitals:   02/17/2019 0440  TempSrc: Oral  PainSc: 0-No pain      Patients Stated Pain Goal: 0 (02/54/27 0623)  Complications: No apparent anesthesia complications

## 2019-02-24 NOTE — Op Note (Signed)
[address]       [place] [address]             [phone]                                          02/01/2019 Patient:  Robert [name]. Pre-Op Dx: LM/3VCAD   NSTEMI   ESRD   Post-op Dx:  Same Procedure: CABG X 4.  LIMA-LAD, RSVG OM1, PDA, D1   81mm Atriclip placement Endoscopic greater saphenous vein harvest on the right leg, and left thight Intra-operative Transesophageal Echocardiogram  Surgeon and Role:      * Nahiem Dredge, Lucile Crater, MD - Primary    Assistant: Josie Saunders, PA-C  Anesthesia  general EBL:  500 ml Blood Administration: 3units pRBCs, 1 unit plts Xclamp Time:  94 min Pump Time:  184 min  Drains: 19 F blake drain: L, mediastinal  Wires: V wires Counts: correct   Indications: 83 yo male with hx of ESRD is admitted to the hospital with afib with RVR.  He had a significant troponin leak, and thus underwent a LHC which showed severe LM/3V. Findings: Significant pleural adhesions.  Good conduit.  Calcified targets.  Intramyocardial OM, and LAD.  Good flows on vein graft.    Operative Technique: All invasive lines were placed in pre-op holding.  After the risks, benefits and alternatives were thoroughly discussed, the patient was brought to the operative theatre.  Anesthesia was induced, and the patient was prepped and draped in normal sterile fashion.  An appropriate surgical pause was performed, and pre-operative antibiotics were dosed accordingly.  We began with simultaneous incisions were made along the right leg for harvesting of the greater saphenous vein and the chest for the sternotomy.  In regards to the sternotomy, this was carried down with bovie cautery, and the sternum was divided with a reciprocating saw.  Meticulous hemostasis was obtained.  The left internal thoracic artery was exposed and harvested in in pedicled fashion.  The patient was systemically heparinized, and the artery was divided distally, and placed in a  papaverine sponge.    The sternal elevator was removed, and a retractor was placed.  The pericardium was divided in the midline and fashioned into a cradle with pericardial stitches.   After we confirmed an appropriate ACT, the ascending aorta was cannulated in standard fashion.  The right atrial appendage was used for venous cannulation site.  Cardiopulmonary bypass was initiated, and the heart retractor was placed. The cross clamp was applied, and a dose of anterograde cardioplegia was given with good arrest of the heart.  We moved to the posterior wall of the heart, and found a good target on the PDA.  An arteriotomy was made, and the vein graft was anastomosed to it in an end to side fashion.  Next we exposed the lateral wall, and found a good target on the OM.  An end to side anastomosis with the vein graft was then created.  Next, we exposed the anterior wall of the heart and identified a good target on D1.   An arteriotomy was created.  The vein was anastomosed in an end to side fashion.  Finally, we exposed a good target on the  LAD, and fashioned an end to side anastomosis between it and the LITA.  We began to re-warm, and a  re-animation dose of cardioplegia was given.  The heart was de-aired, and the cross clamp was removed.  Meticulous hemostasis was obtained.    A partial occludding clamp was then placed on the ascending aorta, and we created an end to side anastomosis between it and the proximal vein grafts.  The proximal sites were marked with rings.  Hemostasis was obtained, and we separated from cardiopulmonary bypass without event.the heparin was reversed with protamine.  Chest tubes and wires were placed, and the sternum was re-approximated with with sternal wires.  The soft tissue and skin were re-approximated wth absorbable suture.    The patient tolerated the procedure without any immediate complications, and was transferred to the ICU in guarded condition.  Khaiden Segreto Bary Leriche

## 2019-02-24 NOTE — Plan of Care (Addendum)
  Problem: Education: Goal: Knowledge of General Education information will improve Description: Including pain rating scale, medication(s)/side effects and non-pharmacologic comfort measures 02/23/2019 0101 by Darlys Gales, RN Outcome: Progressing 02/19/2019 0100 by Darlys Gales, RN Outcome: Progressing   Problem: Health Behavior/Discharge Planning: Goal: Ability to manage health-related needs will improve 02/22/2019 0101 by Darlys Gales, RN Outcome: Progressing 02/19/2019 0100 by Darlys Gales, RN Outcome: Progressing   Problem: Clinical Measurements: Goal: Ability to maintain clinical measurements within normal limits will improve 02/13/2019 0101 by Darlys Gales, RN Outcome: Progressing 02/25/2019 0100 by Darlys Gales, RN Outcome: Progressing Goal: Will remain free from infection 02/18/2019 0101 by Darlys Gales, RN Outcome: Progressing 02/03/2019 0100 by Darlys Gales, RN Outcome: Progressing Goal: Diagnostic test results will improve 02/10/2019 0101 by Darlys Gales, RN Outcome: Progressing 02/11/2019 0100 by Darlys Gales, RN Outcome: Progressing Goal: Respiratory complications will improve 01/27/2019 0101 by Darlys Gales, RN Outcome: Progressing 02/22/2019 0100 by Darlys Gales, RN Outcome: Progressing Goal: Cardiovascular complication will be avoided 02/03/2019 0101 by Darlys Gales, RN Outcome: Progressing 02/25/2019 0100 by Darlys Gales, RN Outcome: Progressing   Problem: Activity: Goal: Risk for activity intolerance will decrease 02/23/2019 0101 by Darlys Gales, RN Outcome: Progressing 02/17/2019 0100 by Darlys Gales, RN Outcome: Progressing   Problem: Nutrition: Goal: Adequate nutrition will be maintained 02/12/2019 0101 by Darlys Gales, RN Outcome: Progressing 02/12/2019 0100 by Darlys Gales, RN Outcome: Progressing   Problem: Coping: Goal: Level of anxiety will decrease 02/21/2019 0101 by Darlys Gales, RN Outcome:  Progressing 02/03/2019 0100 by Darlys Gales, RN Outcome: Progressing   Problem: Elimination: Goal: Will not experience complications related to bowel motility 02/19/2019 0101 by Darlys Gales, RN Outcome: Progressing 02/11/2019 0100 by Darlys Gales, RN Outcome: Progressing Goal: Will not experience complications related to urinary retention 02/23/2019 0101 by Darlys Gales, RN Outcome: Progressing 02/22/2019 0100 by Darlys Gales, RN Outcome: Progressing   Problem: Pain Managment: Goal: General experience of comfort will improve 02/12/2019 0101 by Darlys Gales, RN Outcome: Progressing 02/14/2019 0100 by Darlys Gales, RN Outcome: Progressing   Problem: Safety: Goal: Ability to remain free from injury will improve 02/16/2019 0101 by Darlys Gales, RN Outcome: Progressing 02/25/2019 0100 by Darlys Gales, RN Outcome: Progressing   Problem: Skin Integrity: Goal: Risk for impaired skin integrity will decrease 01/29/2019 0101 by Darlys Gales, RN Outcome: Progressing 02/17/2019 0100 by Darlys Gales, RN Outcome: Progressing

## 2019-02-24 NOTE — Anesthesia Procedure Notes (Signed)
Central Venous Catheter Insertion Performed by: Audry Pili, MD, anesthesiologist Start/End12/25/2020 6:50 AM, 01/29/2019 7:19 AM Patient location: Pre-op. Preanesthetic checklist: patient identified, IV checked, risks and benefits discussed, surgical consent, monitors and equipment checked, pre-op evaluation, timeout performed and anesthesia consent Position: Trendelenburg Lidocaine 1% used for infiltration and patient sedated Hand hygiene performed , maximum sterile barriers used  and Seldinger technique used Catheter size: 8.5 Fr Central line was placed.MAC introducer Procedure performed using ultrasound guided technique. Ultrasound Notes:anatomy identified, needle tip was noted to be adjacent to the nerve/plexus identified, no ultrasound evidence of intravascular and/or intraneural injection and image(s) printed for medical record Attempts: 2 (First attempt on right IJ, unable to thread wire to sufficient depth despite large vessel on [place]) Following insertion, line sutured, dressing applied and Biopatch. Post procedure assessment: blood return through all ports, no air and free fluid flow  Patient tolerated the procedure well with no immediate complications.

## 2019-02-24 NOTE — Progress Notes (Signed)
RT note: rapid wean protocol initiated at [place].  Tolerating well at this time.

## 2019-02-24 NOTE — Brief Op Note (Addendum)
02/18/2019 - 02/19/2019  12:13 PM  PATIENT:  Robert [name].  83 y.o. male  PRE-OPERATIVE DIAGNOSIS:  1. ATRIAL FIBRILLATION 2.CORONARY ARTERY DISEASE 3. MODERATE AORTIC STENOSIS  POST-OPERATIVE DIAGNOSIS:  1. ATRIAL FIBRILLATION 2. CORONARY ARTERY DISEASE 3. MODERATE AORTIC STENOSIS   PROCEDURE:  INTRA OP ECHOCARDIOGRAM, MEDIAN STERNOTOMY for CORONARY ARTERY BYPASS GRAFTING (CABG)x 4 (LIMA to LAD, SVG to DIAGONAL, SVG to OM1, SVG to PDA), using LEFT INTERNAL MAMMARY ARTERY and RIGHT GREATER SAPHENOUS and PARTIAL LEFT THIGH GREATER SAPHENOUS VEIN via EVH,Left triclip placement (using a 45MM AtriCure AtriClip)  SURGEON:  Surgeon(s) and Role:    Lightfoot, Lucile Crater, MD - Primary  PHYSICIAN ASSISTANT: Lars Pinks PA-C  ASSISTANTS: Dineen Kid RNFA  ANESTHESIA:   general  EBL:  Per anesthesia and perfusion record  DRAINS: Chest tubes placed in the mediastinal and pleural spaces   COUNTS CORRECT:  YES  DICTATION: .Dragon Dictation  PLAN OF CARE: Admit to inpatient   PATIENT DISPOSITION:  ICU - intubated and hemodynamically stable.   Delay start of Pharmacological VTE agent (>24hrs) due to surgical blood loss or risk of bleeding: yes  BASELINE WEIGHT: 73 kg

## 2019-02-24 NOTE — Progress Notes (Signed)
TCTS PM Rounds  sedated on vent  Sinus brady with PVCs- trigemmeny No a-wires so will start low dose dobutamine to increase HR, suppress PVC Min chest tube output

## 2019-02-25 ENCOUNTER — Other Ambulatory Visit: Payer: Self-pay | Admitting: Cardiology

## 2019-02-25 ENCOUNTER — Inpatient Hospital Stay (HOSPITAL_COMMUNITY): Payer: Medicare Other

## 2019-02-25 DIAGNOSIS — I480 Paroxysmal atrial fibrillation: Secondary | ICD-10-CM

## 2019-02-25 DIAGNOSIS — Z951 Presence of aortocoronary bypass graft: Secondary | ICD-10-CM

## 2019-02-25 LAB — GLUCOSE, CAPILLARY
Glucose-Capillary: 108 mg/dL — ABNORMAL HIGH (ref 70–99)
Glucose-Capillary: 120 mg/dL — ABNORMAL HIGH (ref 70–99)
Glucose-Capillary: 120 mg/dL — ABNORMAL HIGH (ref 70–99)
Glucose-Capillary: 122 mg/dL — ABNORMAL HIGH (ref 70–99)
Glucose-Capillary: 126 mg/dL — ABNORMAL HIGH (ref 70–99)
Glucose-Capillary: 128 mg/dL — ABNORMAL HIGH (ref 70–99)
Glucose-Capillary: 136 mg/dL — ABNORMAL HIGH (ref 70–99)
Glucose-Capillary: 136 mg/dL — ABNORMAL HIGH (ref 70–99)
Glucose-Capillary: 146 mg/dL — ABNORMAL HIGH (ref 70–99)
Glucose-Capillary: 147 mg/dL — ABNORMAL HIGH (ref 70–99)
Glucose-Capillary: 156 mg/dL — ABNORMAL HIGH (ref 70–99)

## 2019-02-25 LAB — CBC
HCT: 19 % — ABNORMAL LOW (ref 39.0–52.0)
HCT: 25.2 % — ABNORMAL LOW (ref 39.0–52.0)
HCT: 24.6 % — ABNORMAL LOW (ref 39.0–52.0)
Hemoglobin: 6.1 g/dL — CL (ref 13.0–17.0)
Hemoglobin: 8.2 g/dL — ABNORMAL LOW (ref 13.0–17.0)
Hemoglobin: 8.2 g/dL — ABNORMAL LOW (ref 13.0–17.0)
MCH: 31 pg (ref 26.0–34.0)
MCH: 31.2 pg (ref 26.0–34.0)
MCH: 31.2 pg (ref 26.0–34.0)
MCHC: 32.1 g/dL (ref 30.0–36.0)
MCHC: 32.5 g/dL (ref 30.0–36.0)
MCHC: 33.3 g/dL (ref 30.0–36.0)
MCV: 96.4 fL (ref 80.0–100.0)
MCV: 95.8 fL (ref 80.0–100.0)
MCV: 93.5 fL (ref 80.0–100.0)
Platelets: 99 10*3/uL — ABNORMAL LOW (ref 150–400)
Platelets: 82 10*3/uL — ABNORMAL LOW (ref 150–400)
Platelets: 84 10*3/uL — ABNORMAL LOW (ref 150–400)
RBC: 1.97 MIL/uL — ABNORMAL LOW (ref 4.22–5.81)
RBC: 2.63 MIL/uL — ABNORMAL LOW (ref 4.22–5.81)
RBC: 2.63 MIL/uL — ABNORMAL LOW (ref 4.22–5.81)
RDW: 16.7 % — ABNORMAL HIGH (ref 11.5–15.5)
RDW: 15.7 % — ABNORMAL HIGH (ref 11.5–15.5)
RDW: 16.6 % — ABNORMAL HIGH (ref 11.5–15.5)
WBC: 4.7 10*3/uL (ref 4.0–10.5)
WBC: 4.7 10*3/uL (ref 4.0–10.5)
WBC: 6.2 10*3/uL (ref 4.0–10.5)
nRBC: 0 % (ref 0.0–0.2)
nRBC: 0 % (ref 0.0–0.2)
nRBC: 0 % (ref 0.0–0.2)

## 2019-02-25 LAB — PREPARE PLATELET PHERESIS: Unit division: 0

## 2019-02-25 LAB — POCT I-STAT 7, (LYTES, BLD GAS, ICA,H+H)
Acid-base deficit: 6 mmol/L — ABNORMAL HIGH (ref 0.0–2.0)
Acid-base deficit: 5 mmol/L — ABNORMAL HIGH (ref 0.0–2.0)
Acid-base deficit: 9 mmol/L — ABNORMAL HIGH (ref 0.0–2.0)
Bicarbonate: 20.2 mmol/L (ref 20.0–28.0)
Bicarbonate: 20.9 mmol/L (ref 20.0–28.0)
Bicarbonate: 16.8 mmol/L — ABNORMAL LOW (ref 20.0–28.0)
Calcium, Ion: 1.17 mmol/L (ref 1.15–1.40)
Calcium, Ion: 1.18 mmol/L (ref 1.15–1.40)
Calcium, Ion: 1.13 mmol/L — ABNORMAL LOW (ref 1.15–1.40)
HCT: 15 % — ABNORMAL LOW (ref 39.0–52.0)
HCT: 22 % — ABNORMAL LOW (ref 39.0–52.0)
HCT: 20 % — ABNORMAL LOW (ref 39.0–52.0)
Hemoglobin: 5.1 g/dL — CL (ref 13.0–17.0)
Hemoglobin: 7.5 g/dL — ABNORMAL LOW (ref 13.0–17.0)
Hemoglobin: 6.8 g/dL — CL (ref 13.0–17.0)
O2 Saturation: 99 %
O2 Saturation: 99 %
O2 Saturation: 90 %
Patient temperature: 36.8
Patient temperature: 36.3
Patient temperature: 97.9
Potassium: 4.9 mmol/L (ref 3.5–5.1)
Potassium: 5 mmol/L (ref 3.5–5.1)
Potassium: 4.4 mmol/L (ref 3.5–5.1)
Sodium: 146 mmol/L — ABNORMAL HIGH (ref 135–145)
Sodium: 145 mmol/L (ref 135–145)
Sodium: 145 mmol/L (ref 135–145)
TCO2: 21 mmol/L — ABNORMAL LOW (ref 22–32)
TCO2: 22 mmol/L (ref 22–32)
TCO2: 18 mmol/L — ABNORMAL LOW (ref 22–32)
pCO2 arterial: 39.8 mmHg (ref 32.0–48.0)
pCO2 arterial: 38 mmHg (ref 32.0–48.0)
pCO2 arterial: 32.3 mmHg (ref 32.0–48.0)
pH, Arterial: 7.311 — ABNORMAL LOW (ref 7.350–7.450)
pH, Arterial: 7.345 — ABNORMAL LOW (ref 7.350–7.450)
pH, Arterial: 7.323 — ABNORMAL LOW (ref 7.350–7.450)
pO2, Arterial: 135 mmHg — ABNORMAL HIGH (ref 83.0–108.0)
pO2, Arterial: 119 mmHg — ABNORMAL HIGH (ref 83.0–108.0)
pO2, Arterial: 61 mmHg — ABNORMAL LOW (ref 83.0–108.0)

## 2019-02-25 LAB — BPAM PLATELET PHERESIS
Blood Product Expiration Date: 202012311221
ISSUE DATE / TIME: 202012301229
Unit Type and Rh: 7300

## 2019-02-25 LAB — BASIC METABOLIC PANEL
Anion gap: 10 (ref 5–15)
Anion gap: 13 (ref 5–15)
BUN: 21 mg/dL (ref 8–23)
BUN: 27 mg/dL — ABNORMAL HIGH (ref 8–23)
CO2: 19 mmol/L — ABNORMAL LOW (ref 22–32)
CO2: 18 mmol/L — ABNORMAL LOW (ref 22–32)
Calcium: 7.7 mg/dL — ABNORMAL LOW (ref 8.9–10.3)
Calcium: 7.9 mg/dL — ABNORMAL LOW (ref 8.9–10.3)
Chloride: 115 mmol/L — ABNORMAL HIGH (ref 98–111)
Chloride: 114 mmol/L — ABNORMAL HIGH (ref 98–111)
Creatinine, Ser: 4.09 mg/dL — ABNORMAL HIGH (ref 0.61–1.24)
Creatinine, Ser: 4.61 mg/dL — ABNORMAL HIGH (ref 0.61–1.24)
GFR calc Af Amer: 15 mL/min — ABNORMAL LOW (ref >60)
GFR calc Af Amer: 13 mL/min — ABNORMAL LOW (ref >60)
GFR calc non Af Amer: 13 mL/min — ABNORMAL LOW (ref >60)
GFR calc non Af Amer: 11 mL/min — ABNORMAL LOW (ref >60)
Glucose, Bld: 147 mg/dL — ABNORMAL HIGH (ref 70–99)
Glucose, Bld: 159 mg/dL — ABNORMAL HIGH (ref 70–99)
Potassium: 5 mmol/L (ref 3.5–5.1)
Potassium: 4.5 mmol/L (ref 3.5–5.1)
Sodium: 144 mmol/L (ref 135–145)
Sodium: 145 mmol/L (ref 135–145)

## 2019-02-25 LAB — MAGNESIUM
Magnesium: 1.9 mg/dL (ref 1.7–2.4)
Magnesium: 1.8 mg/dL (ref 1.7–2.4)

## 2019-02-25 LAB — PREPARE RBC (CROSSMATCH)

## 2019-02-25 MED ORDER — SODIUM CHLORIDE 0.9% IV SOLUTION: Freq: Once | INTRAVENOUS | Status: DC

## 2019-02-25 MED ORDER — ENOXAPARIN SODIUM 30 MG/0.3ML ~~LOC~~ SOLN
30.0000 mg | Freq: Every day | SUBCUTANEOUS | Status: DC
  Administered 2019-02-25 – 2019-02-26 (×2): 30 mg via SUBCUTANEOUS
  Filled 2019-02-25 (×2): qty 0.3

## 2019-02-25 MED ORDER — MIDODRINE HCL 5 MG PO TABS: 5.0000 mg | ORAL_TABLET | Freq: Three times a day (TID) | ORAL | Status: DC

## 2019-02-25 MED ORDER — NOREPINEPHRINE 4 MG/250ML-% IV SOLN
2.0000 ug/min | INTRAVENOUS | Status: DC
  Administered 2019-02-25 – 2019-02-26 (×2): 2 ug/min via INTRAVENOUS
  Filled 2019-02-25 (×2): qty 250

## 2019-02-25 MED ORDER — SODIUM BICARBONATE 8.4 % IV SOLN
50.0000 meq | Freq: Once | INTRAVENOUS | Status: AC
  Administered 2019-02-25: 12:00:00 50 meq via INTRAVENOUS
  Filled 2019-02-25: qty 50

## 2019-02-25 MED ORDER — SODIUM ZIRCONIUM CYCLOSILICATE 10 G PO PACK
10.0000 g | PACK | Freq: Three times a day (TID) | ORAL | Status: AC
  Filled 2019-02-25 (×2): qty 1

## 2019-02-25 MED ORDER — CHLORHEXIDINE GLUCONATE CLOTH 2 % EX PADS
6.0000 | MEDICATED_PAD | Freq: Every day | CUTANEOUS | Status: DC
  Administered 2019-02-26: 12:00:00 6 via TOPICAL

## 2019-02-25 MED ORDER — SODIUM BICARBONATE 8.4 % IV SOLN
50.0000 meq | Freq: Once | INTRAVENOUS | Status: AC
  Administered 2019-02-25: 50 meq via INTRAVENOUS

## 2019-02-25 MED ORDER — BUDESONIDE 0.25 MG/2ML IN SUSP
0.2500 mg | Freq: Four times a day (QID) | RESPIRATORY_TRACT | Status: DC | PRN
  Administered 2019-02-26 – 2019-03-07 (×9): 0.25 mg via RESPIRATORY_TRACT
  Filled 2019-02-25 (×9): qty 2

## 2019-02-25 MED ORDER — INSULIN ASPART 100 UNIT/ML ~~LOC~~ SOLN
0.0000 [IU] | SUBCUTANEOUS | Status: DC
  Administered 2019-02-25: 4 [IU] via SUBCUTANEOUS
  Administered 2019-02-25 – 2019-03-03 (×15): 2 [IU] via SUBCUTANEOUS
  Administered 2019-03-03: 13:00:00 4 [IU] via SUBCUTANEOUS
  Administered 2019-03-03: 17:00:00 2 [IU] via SUBCUTANEOUS
  Administered 2019-03-03 (×2): 4 [IU] via SUBCUTANEOUS
  Administered 2019-03-03: 05:00:00 8 [IU] via SUBCUTANEOUS
  Administered 2019-03-04: 16:00:00 4 [IU] via SUBCUTANEOUS
  Administered 2019-03-04: 20:00:00 2 [IU] via SUBCUTANEOUS
  Administered 2019-03-04: 08:00:00 8 [IU] via SUBCUTANEOUS
  Administered 2019-03-04: 4 [IU] via SUBCUTANEOUS
  Administered 2019-03-04: 8 [IU] via SUBCUTANEOUS
  Administered 2019-03-04 – 2019-03-05 (×2): 4 [IU] via SUBCUTANEOUS
  Administered 2019-03-05 (×3): 8 [IU] via SUBCUTANEOUS
  Administered 2019-03-06: 4 [IU] via SUBCUTANEOUS
  Administered 2019-03-06: 09:00:00 2 [IU] via SUBCUTANEOUS
  Administered 2019-03-06 (×2): 4 [IU] via SUBCUTANEOUS
  Administered 2019-03-06: 20:00:00 2 [IU] via SUBCUTANEOUS
  Administered 2019-03-06: 4 [IU] via SUBCUTANEOUS
  Administered 2019-03-07 – 2019-03-08 (×7): 2 [IU] via SUBCUTANEOUS
  Administered 2019-03-08: 4 [IU] via SUBCUTANEOUS
  Administered 2019-03-08 – 2019-03-09 (×3): 2 [IU] via SUBCUTANEOUS
  Administered 2019-03-09: 09:00:00 4 [IU] via SUBCUTANEOUS
  Administered 2019-03-09 (×2): 2 [IU] via SUBCUTANEOUS
  Administered 2019-03-10: 4 [IU] via SUBCUTANEOUS
  Administered 2019-03-11 (×3): 8 [IU] via SUBCUTANEOUS
  Administered 2019-03-11: 02:00:00 12 [IU] via SUBCUTANEOUS

## 2019-02-25 MED ORDER — LEVOTHYROXINE SODIUM 50 MCG PO TABS: 50.0000 ug | ORAL_TABLET | Freq: Every day | ORAL | Status: DC

## 2019-02-25 MED FILL — Heparin Sodium (Porcine) Inj 1000 Unit/ML: INTRAMUSCULAR | Qty: 30 | Status: AC

## 2019-02-25 MED FILL — Potassium Chloride Inj 2 mEq/ML: INTRAVENOUS | Qty: 40 | Status: AC

## 2019-02-25 MED FILL — Lidocaine HCl Local Preservative Free (PF) Inj 2%: INTRAMUSCULAR | Qty: 15 | Status: AC

## 2019-02-25 NOTE — Progress Notes (Signed)
[facility] Progress Note   Subjective: still intubated, nods / shakes head in response to quetsions, possibly HOH  Objective Vitals:   02/25/19 0840 02/25/19 0900 02/25/19 0920 02/25/19 1003  BP:  (!) 114/48  (!) 176/69  Pulse: 63 62 69 85  Resp: 19 (!) 23 20 (!) 26  Temp: 98.1 F (36.7 C) 98.1 F (36.7 C) 98.1 F (36.7 C)   TempSrc:      SpO2: 100% 100% 100% 98%  Weight:      Height:        Physical Exam General: Intubated, awake Heart: RRR systolic murmur  Lungs: Clear bilaterally  Abdomen: soft non-tender  Extremities: 1-2+ LE/ UE edema Dialysis Access: LUE AVF +bruit   Dialysis:  [facility] TTS 4h   400/800    73.5kg    3/2.25Ca bath  P4  Hep none L AVF Hectorol 4 Venofer 50 Mircera 75 (last 12/15)   Assessment/Plan: 1. CAD s/p stents/Elevated troponin. LHC with severe multivessel CAD.  s/p CABG x 4 yest 12/30. To be extubated this am. 2. ESRD -  HD TTS. Dialyzed 12.29. Will hold HD today on POD#1, plan HD Fri and Sat 3. Hyperkalemia - renal diet, lokelma 10 gm x 2 today, repeat K this afternoon pending 4. Atrial fibrillation - per cards/ TCTS 5. Hypertension/volume  - up 9kg post op which is typical, CXR clear w/ 3rd spacing 6. Anemia abl/ ckd  - Hgb down post op 6.1, got 2u prbc overnight. Resume ESA with next HD.  7. Metabolic bone disease -  Continue usual binders/Hectorol  8. Nutrition - Renal diet/vitamins     Medications: . sodium chloride    . sodium chloride    . sodium chloride    . sodium chloride 20 mL/hr at 02/25/19 0900  . albumin human 12.5 g (02/11/2019 2120)  . cefUROXime (ZINACEF)  IV 1.5 g (02/25/19 1029)  . dexmedetomidine (PRECEDEX) IV infusion Stopped (02/25/19 0846)  . DOBUTamine 2.5 mcg/kg/min (02/25/19 0900)  . famotidine (PEPCID) IV Stopped (02/11/2019 2310)  . insulin 0.5 mL/hr at 02/25/19 0900  . magnesium sulfate    . niCARDipine Stopped (02/17/2019 1558)  . nitroGLYCERIN Stopped (02/17/2019 1712)  . norepinephrine  (LEVOPHED) Adult infusion 2 mcg/min (02/25/19 1059)  . phenylephrine (NEO-SYNEPHRINE) Adult infusion Stopped (02/25/19 0435)   . sodium chloride   Intravenous Once  . sodium chloride   Intravenous Once  . acetaminophen  1,000 mg Oral Q6H   Or  . acetaminophen (TYLENOL) oral liquid 160 mg/5 mL  1,000 mg Per Tube Q6H  . aspirin EC  325 mg Oral Daily   Or  . aspirin  324 mg Per Tube Daily  . bisacodyl  10 mg Oral Daily   Or  . bisacodyl  10 mg Rectal Daily  . calcium acetate  1,334 mg Oral TID WC  . chlorhexidine gluconate (MEDLINE KIT)  15 mL Mouth Rinse BID  . Chlorhexidine Gluconate Cloth  6 each Topical Q0600  . docusate sodium  200 mg Oral Daily  . enoxaparin (LOVENOX) injection  30 mg Subcutaneous QHS  . insulin aspart  0-24 Units Subcutaneous Q4H  . mouth rinse  15 mL Mouth Rinse 10 times per day  . metoprolol tartrate  12.5 mg Oral BID   Or  . metoprolol tartrate  12.5 mg Per Tube BID  . midodrine  5 mg Oral TID WC  . mupirocin ointment  1 application Nasal BID  . [START ON 02/26/2019] pantoprazole  40 mg Oral Daily  . simvastatin  40 mg Oral q1800  . sodium chloride flush  10-40 mL Intracatheter Q12H  . sodium chloride flush  3 mL Intravenous Q12H  . sodium zirconium cyclosilicate  10 g Oral TID

## 2019-02-25 NOTE — Progress Notes (Signed)
[address]       [place] [address]             [phone]                 1 Day Post-Op Procedure(s) (LRB): CORONARY ARTERY BYPASS GRAFTING (CABG) using endoscopic greater saphenous vein harvest: SVC to Diag1; SVC to OM1; SVC to PDA and LIMA to LAD. (N/A) Clipping Of Atrial Appendage using 45 MM AtriCure AtriClip (N/A)   Events: Extubated this am.   Received an additional 2 units overnight _______________________________________________________________ Vitals: BP (!) 114/48   Pulse 69   Temp 98.1 F (36.7 C)   Resp 20   Ht 5\' 8"  (1.727 m)   Wt 73 kg   SpO2 100%   BMI 24.47 kg/m   - Neuro: awake, slightly confused  - Cardiovascular: sinus  Drips: dob 2.5.   PAP: (34-68)/(7-29) 40/15 CVP:  [1 mmHg-15 mmHg] 4 mmHg CO:  [4.3 L/min-6.5 L/min] 4.9 L/min CI:  [2.3 L/min/m2-3.5 L/min/m2] 2.6 L/min/m2  - Pulm: tachypnic,  Vent Mode: SIMV;PSV;PRVC FiO2 (%):  [40 %-50 %] 40 % Set Rate:  [4 bmp-12 bmp] 12 bmp Vt Set:  [540 mL] 540 mL PEEP:  [5 cmH20] 5 cmH20 Pressure Support:  [10 cmH20] 10 cmH20 Plateau Pressure:  [8 cmH20-23 cmH20] 23 cmH20  ABG    Component Value Date/Time   PHART 7.345 (L) 02/25/2019 0450   PCO2ART 38.0 02/25/2019 0450   PO2ART 119.0 (H) 02/25/2019 0450   HCO3 20.9 02/25/2019 0450   TCO2 22 02/25/2019 0450   ACIDBASEDEF 5.0 (H) 02/25/2019 0450   O2SAT 99.0 02/25/2019 0450    - Abd: soft - Extremity: warm  .Intake/Output      12/30 0701 - 12/31 0700 12/31 0701 - 01/01 0700   I.V. (mL/kg) 3080.5 (42.2) 98.2 (1.3)   Blood 1318    IV Piggyback 1150    Total Intake(mL/kg) 5548.5 (76) 98.2 (1.3)   Urine (mL/kg/hr) 72 (0) 0 (0)   Other     Blood 2000    Chest Tube 500 0   Total Output 2572 0   Net +2976.5 +98.2           _______________________________________________________________ Labs: CBC Latest Ref Rng & Units 02/25/2019 02/25/2019 02/25/2019  WBC 4.0 - 10.5 K/uL 4.7 - -  Hemoglobin 13.0 - 17.0 g/dL  8.2(L) 7.5(L) 5.1(LL)  Hematocrit 39.0 - 52.0 % 25.2(L) 22.0(L) 15.0(L)  Platelets 150 - 400 K/uL 82(L) - -   CMP Latest Ref Rng & Units 02/25/2019 02/25/2019 02/25/2019  Glucose 70 - 99 mg/dL 147(H) - -  BUN 8 - 23 mg/dL 21 - -  Creatinine 0.61 - 1.24 mg/dL 4.09(H) - -  Sodium 135 - 145 mmol/L 144 145 146(H)  Potassium 3.5 - 5.1 mmol/L 5.0 5.0 4.9  Chloride 98 - 111 mmol/L 115(H) - -  CO2 22 - 32 mmol/L 19(L) - -  Calcium 8.9 - 10.3 mg/dL 7.7(L) - -  Total Protein 6.5 - 8.1 g/dL - - -  Total Bilirubin 0.3 - 1.2 mg/dL - - -  Alkaline Phos 38 - 126 U/L - - -  AST 15 - 41 U/L - - -  ALT 0 - 44 U/L - - -    CXR: Clear bases, some PV congestion  _______________________________________________________________  Assessment and Plan: POD 1 s/p CABG Atriclip  Neuro: off sedation.  Some baseline dementia and hearing problems.  Will continue to re-orient  CV: low diastolic.  Will shoot for SBP > 110.  Will wean dobutamine.  Good hemodynamics Pulm: continue pulm toilet Renal: likely dialysis tomorrow.  Will follow K. GI: recently extubated, but will advance diet Heme: stable ID: [email or web address] Endo: SSI  Dispo: continue ICU care  Melodie Bouillon, MD 02/25/2019 10:05 AM

## 2019-02-25 NOTE — Procedures (Signed)
Extubation Procedure Note  Patient Details:   Name: Robert [name]. DOB: 1935/11/11 MRN: [id number]   Airway Documentation:    Vent end date: 02/25/19 Vent end time: 1003   Evaluation  O2 sats: stable throughout Complications: No apparent complications Patient did tolerate procedure well. Bilateral Breath Sounds: Clear, Diminished   Yes   Pt was extubated without the rapid wean protocol per Dr. Kipp Brood. Cuff leak was noted prior to extubation and pt seems comfortable at this time on 4 L Portis. Dr. Kipp Brood was at bedside for extubation. RT will continue to monitor.   Ronaldo Miyamoto 02/25/2019, 10:05 AM

## 2019-02-25 NOTE — Progress Notes (Signed)
Attempted rapid wean on pt this AM. Pt got extremely agitated and increased RR into mid 59s. Pt was placed back on original settings. RN aware.

## 2019-02-25 NOTE — Progress Notes (Signed)
Subjective:  Post CABG rhythm stable on vent  Objective:  Vitals:   02/25/19 0500 02/25/19 0600 02/25/19 0700 02/25/19 0733  BP: (!) 140/52 (!) 130/51 135/60 (!) 143/39  Pulse: (!) 55 (!) 57 61 61  Resp: _0 (!) 33  Temp: (!) 97.3 F (36.3 C) (!) 97.3 F (36.3 C) (!) 97.5 F (36.4 C)   TempSrc:      SpO2: 100% 100% 100% 100%  Weight:      Height:        Intake/Output from previous day:  Intake/Output Summary (Last 24 hours) at 02/25/2019 0747 Last data filed at 02/25/2019 0600 Gross per 24 hour  Intake 5548.54 ml  Output 2572 ml  Net 2976.54 ml    Physical Exam: Intubated Left IJ Basilar crackles on vent Post sternotomy Echymosis right groin Pedal pulses intact Abdomen soft    Lab Results: Basic Metabolic Panel: Recent Labs    02/11/2019 0545 02/03/2019 2037 02/25/19 0450 02/25/19 0455  NA 140 143 145 144  K 4.5 4.4 5.0 5.0  CL 102 117*  --  115*  CO2 24 15*  --  19*  GLUCOSE 95 133*  --  147*  BUN 16 20  --  21  CREATININE 3.61* 3.83*  --  4.09*  CALCIUM 8.7* 7.1*  --  7.7*  MG 2.0 1.8  --  1.9  PHOS 2.9  --   --   --    Liver Function Tests: Recent Labs    02/18/2019 1736 01/28/2019 0545  AST 18  --   ALT 19  --   ALKPHOS 87  --   BILITOT 0.4  --   PROT 6.5  --   ALBUMIN 3.5 3.3*   No results for input(s): LIPASE, AMYLASE in the last 72 hours. CBC: Recent Labs    02/07/2019 1736 02/13/2019 0545 02/25/2019 0815 02/25/19 0114 02/25/19 0450 02/25/19 0455  WBC 5.5 6.2   < > 4.7  --  4.7  NEUTROABS 3.8 4.3  --   --   --   --   HGB 12.2* 11.6*  --  6.1* 7.5* 8.2*  HCT 38.9* 36.0*  --  19.0* 22.0* 25.2*  MCV 99.0 96.5   < > 96.4  --  95.8  PLT PLATELET CLUMPS NOTED ON SMEAR, COUNT APPEARS DECREASED 118*   < > 99*  --  82*   < > = values in this interval not displayed.   Cardiac Enzymes: No results for input(s): CKTOTAL, CKMB, CKMBINDEX, TROPONINI in the last 72 hours. BNP: Invalid input(s): POCBNP D-Dimer: No results for input(s):  DDIMER in the last 72 hours. Hemoglobin A1C: Recent Labs    01/27/2019 2100  HGBA1C 7.2*   Fasting Lipid Panel: Recent Labs    02/21/2019 0545  CHOL 112  HDL 29*  LDLCALC 52  TRIG 157*  CHOLHDL 3.9   Thyroid Function Tests: Recent Labs    02/07/2019 1400  TSH 3.084   Anemia Panel: No results for input(s): VITAMINB12, FOLATE, FERRITIN, TIBC, IRON, RETICCTPCT in the last 72 hours.  Imaging: CT CHEST WO CONTRAST  Result Date: 02/12/2019 CLINICAL DATA:  Aortic disease, nontraumatic eval for ascending aortic calcification EXAM: CT CHEST WITHOUT CONTRAST TECHNIQUE: Multidetector CT imaging of the chest was performed following the standard protocol without IV contrast. COMPARISON:  Radiograph yesterday. FINDINGS: Cardiovascular: Aortic valvular and mitral annulus calcifications. Coronary artery calcifications. Minimal atherosclerotic calcifications of the ascending aorta at the sinotubular junction. Trace aortic calcifications  in the mid ascending aorta. Moderate atherosclerotic calcifications of the transverse and descending aorta. Conventional branching pattern from the aortic arch. No aortic aneurysm. No periaortic stranding. Upper normal heart size. No pericardial effusion. Mediastinum/Nodes: Shotty mediastinal lymph nodes with 12 mm left lower paratracheal node, series 3, image 52. prominent prevascular node measuring 8 mm short axis. Limited assessment for hilar adenopathy given lack of IV contrast. Decompressed esophagus with small hiatal hernia. No visualized thyroid nodule. Lungs/Pleura: Moderate emphysema. Moderate central bronchial thickening. Minimal mucus within the right middle lobe bronchus. Small bilateral pleural effusions with adjacent compressive atelectasis. Calcified granuloma in the right and left lower lobes. Tiny subpleural left upper lobe pulmonary nodule, series 4, image 37. No septal thickening or pulmonary edema. Upper Abdomen: No acute findings. Atherosclerosis of upper  abdominal vasculature. Musculoskeletal: Remote mid sternal fracture. Implanted loop recorder in the left chest wall. Remote right anterior rib fractures. No acute osseous abnormality. IMPRESSION: 1. Thoracic aortic atherosclerosis, minimal/mild involving the ascending aorta. No aortic aneurysm. 2. Aortic valvular, mitral annulus, and coronary artery calcifications. 3. Small bilateral pleural effusions. 4. Emphysema with central bronchial thickening. 5. Shotty mediastinal lymph nodes, likely reactive, largest node measures 12 mm in the left lower paratracheal station. 6. Tiny subpleural nodule in the left upper lobe. No follow-up needed if patient is low-risk. Non-contrast chest CT can be considered in 12 months if patient is high-risk. This recommendation follows the consensus statement: Guidelines for Management of Incidental Pulmonary Nodules Detected on CT Images: From the Fleischner Society 2017; Radiology 2017; 284:228-243. Aortic Atherosclerosis (ICD10-I70.0) and Emphysema (ICD10-J43.9). Electronically Signed   By: Keith Rake M.D.   On: 01/30/2019 23:34   CARDIAC CATHETERIZATION  Result Date: 01/27/2019  Dist LM to Prox LAD lesion is 90% stenosed.  Ost Cx to Prox Cx lesion is 95% stenosed.  Mid LAD lesion is 80% stenosed - at stent overlap site.  1st Diag-1 lesion is 55% stenosed. 1st Diag-2 lesion is 55% stenosed.  Prox RCA to Mid RCA - previously placed DES is 20% in-stent re-stenosed.  Dist RCA lesion is 70% stenosed proximal to bifucation.  LV end diastolic pressure is moderately elevated.  There is moderate aortic valve stenosis.  SUMMARY  Severe Calcified distal LM-Ostial LAD & LCX ([place] 1,1,1,) 90-95% stenosis  ~80% in-stent restenosis of LAD overlapping DES; ostial large D1 60% with prox ~60%.  Mild ~20% ISR of mRCA stent with distal RCA tubular 70%.  Calcified Aortic Valve with relatively low Gradient noted on Vavle pull-back gradient. - Suspect more Moderate to Mild-Moderate  AS as opposed to Mod-Severe AS.  Good downstream LAD, D1, Cx-OM1 & rPDA targets. RECOMMENDATIONS  Best Revascularization option is CABG - Dr. Kipp Brood from [place] has been consulted -- need to check P2Y12 Inhibition assay & await Plavix washout (las dose Sunday 12/27)  Will transfer to CCU for closer monitoring - & to allow for HD to be done in the CCU  Closely monitor BP  Restart IV Heparin 8 hr after sheath removal. Glenetta Hew, MD   DG Chest [facility] 1 View  Result Date: 02/07/2019 CLINICAL DATA:  Status post coronary bypass grafting EXAM: PORTABLE CHEST 1 VIEW COMPARISON:  02/20/2019 FINDINGS: Cardiac shadow is stable. Loop recorder is again noted. Postsurgical changes are seen consistent with the given clinical history. Endotracheal tube, gastric catheter and Swan-Ganz catheter are noted. Mediastinal drain and left-sided thoracostomy tube are noted. No pneumothorax is seen. No focal infiltrate is noted. Mild central vascular congestion is seen. There is  a rectangular density identified adjacent to the tip of the Swan-Ganz catheter. This may be extrinsic to the patient IMPRESSION: Tubes and lines as described above. Mild vascular congestion. Linear density overlying the cardiac shadow which is new from the prior exam. This may be extrinsic to the patient. Clinical correlation is recommended. Electronically Signed   By: Inez Catalina M.D.   On: 02/09/2019 15:23   ECHOCARDIOGRAM COMPLETE  Result Date: 01/28/2019   ECHOCARDIOGRAM REPORT   Patient Name:   Robert [name]. Date of Exam: 02/03/2019 Medical Rec #:  [id number]            Height:       68.0 in Accession #:    [id number]           Weight:       162.5 lb Date of Birth:  09-01-1935            BSA:          1.87 m Patient Age:    64 years             BP:           145/70 mmHg Patient Gender: M                    HR:           62 bpm. Exam Location:  Inpatient Procedure: 2D Echo Indications:    Atrial Fibrillation 427.31 / I48.91  History:         Patient has prior history of Echocardiogram examinations, most                 recent 06/23/2018. CHF, CAD; Risk Factors:Diabetes, Hypertension                 and Dyslipidemia. CKD                 ESRD.  Sonographer:    Vikki Ports Turrentine Referring Phys: [place]  1. Left ventricular ejection fraction, by visual estimation, is 60 to 65%. The left ventricle has normal function. There is no left ventricular hypertrophy.  2. Left ventricular diastolic parameters are consistent with Grade II diastolic dysfunction (pseudonormalization).  3. The left ventricle has no regional wall motion abnormalities.  4. Elevated LVEDP.  5. Global right ventricle has normal systolic function.The right ventricular size is normal. No increase in right ventricular wall thickness.  6. Left atrial size was severely dilated.  7. Right atrial size was moderately dilated.  8. The mitral valve is normal in structure. Mild mitral valve regurgitation. No evidence of mitral stenosis.  9. The tricuspid valve is normal in structure. 10. The aortic valve is normal in structure. Aortic valve regurgitation is not visualized. Moderate aortic valve stenosis. 11. There is severe calcifcation of the aortic valve. 12. There is severe thickening of the aortic valve. 13. The pulmonic valve was normal in structure. Pulmonic valve regurgitation is trivial. 14. Normal pulmonary artery systolic pressure. 15. The inferior vena cava is dilated in size with <50% respiratory variability, suggesting right atrial pressure of 15 mmHg. FINDINGS  Left Ventricle: Left ventricular ejection fraction, by visual estimation, is 60 to 65%. The left ventricle has normal function. The left ventricle has no regional wall motion abnormalities. The left ventricular internal cavity size was the left ventricle is normal in size. There is no left ventricular hypertrophy. Left ventricular diastolic parameters are consistent with Grade II diastolic dysfunction  (  pseudonormalization). Normal left atrial pressure. Elevated LVEDP. Right Ventricle: The right ventricular size is normal. No increase in right ventricular wall thickness. Global RV systolic function is has normal systolic function. The tricuspid regurgitant velocity is 1.57 m/s, and with an assumed right atrial pressure  of 15 mmHg, the estimated right ventricular systolic pressure is normal at 24.9 mmHg. Left Atrium: Left atrial size was severely dilated. Right Atrium: Right atrial size was moderately dilated Pericardium: There is no evidence of pericardial effusion. Mitral Valve: The mitral valve is normal in structure. Mild mitral valve regurgitation. No evidence of mitral valve stenosis by observation. Tricuspid Valve: The tricuspid valve is normal in structure. Tricuspid valve regurgitation is mild. Aortic Valve: The aortic valve is normal in structure.. There is severe thickening and severe calcifcation of the aortic valve. Aortic valve regurgitation is not visualized. Moderate aortic stenosis is present. There is severe thickening of the aortic valve. There is severe calcifcation of the aortic valve. Aortic valve mean gradient measures 16.2 mmHg. Aortic valve peak gradient measures 26.3 mmHg. Aortic valve area, by VTI measures 0.94 cm. Pulmonic Valve: The pulmonic valve was normal in structure. Pulmonic valve regurgitation is trivial. Pulmonic regurgitation is trivial. Aorta: The aortic root, ascending aorta and aortic arch are all structurally normal, with no evidence of dilitation or obstruction. Venous: The inferior vena cava is dilated in size with less than 50% respiratory variability, suggesting right atrial pressure of 15 mmHg. IAS/Shunts: No atrial level shunt detected by color flow Doppler. There is no evidence of a patent foramen ovale. No ventricular septal defect is seen or detected. There is no evidence of an atrial septal defect. Additional Comments: Aortic valve stenosis is mild by mean  gradient. However, visually it is severely calciffied and restricted. By planimetry, aortic valve area is 1.23 cm^2, consistent with moderate aortic stenosis.  LEFT VENTRICLE PLAX 2D LVIDd:         5.04 cm  Diastology LVIDs:         3.21 cm  LV e' lateral:   8.43 cm/s LV PW:         0.90 cm  LV E/e' lateral: 19.8 LV IVS:        0.93 cm  LV e' medial:    6.08 cm/s LVOT diam:     1.90 cm  LV E/e' medial:  27.5 LV SV:         79 ml LV SV Index:   41.92 LVOT Area:     2.84 cm  RIGHT VENTRICLE RV S prime:     11.80 cm/s TAPSE (M-mode): 2.4 cm LEFT ATRIUM              Index       RIGHT ATRIUM           Index LA diam:        3.70 cm  1.98 cm/m  RA Area:     22.00 cm LA Vol (A2C):   105.0 ml 56.12 ml/m RA Volume:   67.00 ml  35.81 ml/m LA Vol (A4C):   101.0 ml 53.98 ml/m LA Biplane Vol: 104.0 ml 55.58 ml/m  AORTIC VALVE AV Area (Vmax):    1.05 cm AV Area (Vmean):   0.97 cm AV Area (VTI):     0.94 cm AV Vmax:           256.60 cm/s AV Vmean:          192.800 cm/s AV VTI:  0.651 m AV Peak Grad:      26.3 mmHg AV Mean Grad:      16.2 mmHg LVOT Vmax:         95.40 cm/s LVOT Vmean:        66.300 cm/s LVOT VTI:          0.217 m LVOT/AV VTI ratio: 0.33  AORTA Ao Root diam: 3.20 cm MITRAL VALVE                         TRICUSPID VALVE MV Area (PHT): 3.17 cm              TR Peak grad:   9.9 mmHg MV PHT:        69.31 msec            TR Vmax:        157.00 cm/s MV Decel Time: 239 msec MV E velocity: 167.00 cm/s 103 cm/s  SHUNTS MV A velocity: 124.00 cm/s 70.3 cm/s Systemic VTI:  0.22 m MV E/A ratio:  1.35        1.5       Systemic Diam: 1.90 cm  Skeet Latch MD Electronically signed by Skeet Latch MD Signature Date/Time: 02/10/2019/3:13:46 PM    Final    ECHO INTRAOPERATIVE TEE  Result Date: 02/21/2019  *INTRAOPERATIVE TRANSESOPHAGEAL REPORT *  Patient Name:   Robert [name]. Date of Exam: 02/21/2019 Medical Rec #:  [id number]            Height:       68.0 in Accession #:    [id number]            Weight:       160.9 lb Date of Birth:  11/09/1935            BSA:          1.86 m Patient Age:    59 years             BP:           120/80 mmHg Patient Gender: M                    HR:           72 bpm. Exam Location:  Anesthesiology Transesophogeal exam was perform intraoperatively during surgical procedure. Patient was closely monitored under general anesthesia during the entirety of examination. Indications:     Coronary artery disease Sonographer:     Paulita Fujita RDCS Performing Phys: [id number] Lucile Crater LIGHTFOOT Diagnosing Phys: Nolon Nations MD Complications: No known complications during this procedure. POST-OP IMPRESSIONS - Left Ventricle: The left ventricle is unchanged from pre-bypass. - Aorta: The aorta appears unchanged from pre-bypass. - Aortic Valve: The aortic valve appears unchanged from pre-bypass. - Mitral Valve: The mitral valve appears unchanged from pre-bypass. - Tricuspid Valve: The tricuspid valve appears unchanged from pre-bypass. PRE-OP FINDINGS  Left Ventricle: The left ventricle has normal systolic function, with an ejection fraction of 55-60%. The cavity size was normal. There is moderate concentric left ventricular hypertrophy. Right Ventricle: The right ventricle has normal systolic function. The cavity was normal. There is no increase in right ventricular wall thickness. Left Atrium: Left atrial size was normal in size. Right Atrium: Right atrial size was normal in size. Right atrial pressure is estimated at 10 mmHg. Interatrial Septum: No obvious shunts. Pericardium: There is no evidence of pericardial effusion. Mitral Valve: The mitral valve  is normal in structure. Moderate thickening of the mitral valve leaflet. Moderate calcification of the mitral valve leaflet. Mitral valve regurgitation is trivial by color flow Doppler. There is no evidence of mitral valve vegetation. Tricuspid Valve: The tricuspid valve was normal in structure. Tricuspid valve regurgitation is trivial by  color flow Doppler. No TV vegetation was visualized. Aortic Valve: The aortic valve is tricuspid There is moderate thickening of the aortic valve and There is severe calcifcation of the aortic valve Aortic valve regurgitation is trivial by color flow Doppler. There is mild stenosis of the aortic valve. Pulmonic Valve: The pulmonic valve was normal in structure. Pulmonic valve regurgitation is trivial by color flow Doppler. Aorta: There is evidence of atheroma plaque in the ascending aorta, aortic arch and descending aorta; Grade III, measuring 3-58m in size. +-------------+---------++ AORTIC VALVE           +-------------+---------++ AV Mean Grad:15.0 mmHg +-------------+---------++  JNolon NationsMD Electronically signed by JNolon NationsMD Signature Date/Time: 02/20/2019/3:56:25 PM    Final     Cardiac Studies:  ECG:  Admission afib rapid rate diffuse ST deprssion    Telemetry:  NSR   Echo: EF 60-65% moderate AS mean gradient 16 mmHg  Medications:   . sodium chloride   Intravenous Once  . sodium chloride   Intravenous Once  . acetaminophen  1,000 mg Oral Q6H   Or  . acetaminophen (TYLENOL) oral liquid 160 mg/5 mL  1,000 mg Per Tube Q6H  . aspirin EC  325 mg Oral Daily   Or  . aspirin  324 mg Per Tube Daily  . bisacodyl  10 mg Oral Daily   Or  . bisacodyl  10 mg Rectal Daily  . calcium acetate  1,334 mg Oral TID WC  . chlorhexidine gluconate (MEDLINE KIT)  15 mL Mouth Rinse BID  . Chlorhexidine Gluconate Cloth  6 each Topical Q0600  . docusate sodium  200 mg Oral Daily  . mouth rinse  15 mL Mouth Rinse 10 times per day  . metoprolol tartrate  12.5 mg Oral BID   Or  . metoprolol tartrate  12.5 mg Per Tube BID  . mupirocin ointment  1 application Nasal BID  . [START ON 02/26/2019] pantoprazole  40 mg Oral Daily  . simvastatin  40 mg Oral q1800  . sodium chloride flush  10-40 mL Intracatheter Q12H  . sodium chloride flush  3 mL Intravenous Q12H     . sodium chloride    .  sodium chloride    . sodium chloride    . sodium chloride 20 mL/hr at 02/25/19 0600  . albumin human 12.5 g (02/20/2019 2120)  . cefUROXime (ZINACEF)  IV    . dexmedetomidine (PRECEDEX) IV infusion 0.7 mcg/kg/hr (02/25/19 0600)  . DOBUTamine 2.5 mcg/kg/min (02/25/19 0600)  . famotidine (PEPCID) IV Stopped (02/13/2019 2310)  . insulin 1 mL/hr at 02/25/19 0600  . lactated ringers    . lactated ringers    . magnesium sulfate    . niCARDipine Stopped (02/17/2019 1558)  . nitroGLYCERIN Stopped (02/13/2019 1712)  . phenylephrine (NEO-SYNEPHRINE) Adult infusion Stopped (02/25/19 0435)    Assessment/Plan:   1. PAF:  Maintaining NSR post CABG will need oral DOAC on d/c  2. CAD:  LM and distal RCA EF preserved post CABG doing well  3. AS:  Agree with age and co morbidities that AVR not indicated can follow AS and consider TAVR if needed at latter date   PJenkins Rouge  02/25/2019, 7:47 AM

## 2019-02-25 NOTE — Progress Notes (Signed)
Dr. Kipp Brood called with ABG results and Hgb results from [facility].  Order for sodium bicarb 1 amp and to follow up hgb with afternoon labs later today at 2:00.

## 2019-02-26 ENCOUNTER — Inpatient Hospital Stay (HOSPITAL_COMMUNITY): Payer: Medicare Other

## 2019-02-26 DIAGNOSIS — I25812 Atherosclerosis of bypass graft of coronary artery of transplanted heart without angina pectoris: Secondary | ICD-10-CM

## 2019-02-26 LAB — BASIC METABOLIC PANEL
Anion gap: 12 (ref 5–15)
BUN: 32 mg/dL — ABNORMAL HIGH (ref 8–23)
CO2: 21 mmol/L — ABNORMAL LOW (ref 22–32)
Calcium: 8.4 mg/dL — ABNORMAL LOW (ref 8.9–10.3)
Chloride: 113 mmol/L — ABNORMAL HIGH (ref 98–111)
Creatinine, Ser: 5.43 mg/dL — ABNORMAL HIGH (ref 0.61–1.24)
GFR calc Af Amer: 11 mL/min — ABNORMAL LOW (ref >60)
GFR calc non Af Amer: 9 mL/min — ABNORMAL LOW (ref >60)
Glucose, Bld: 110 mg/dL — ABNORMAL HIGH (ref 70–99)
Potassium: 4.7 mmol/L (ref 3.5–5.1)
Sodium: 146 mmol/L — ABNORMAL HIGH (ref 135–145)

## 2019-02-26 LAB — CBC
HCT: 24.5 % — ABNORMAL LOW (ref 39.0–52.0)
Hemoglobin: 7.8 g/dL — ABNORMAL LOW (ref 13.0–17.0)
MCH: 30.6 pg (ref 26.0–34.0)
MCHC: 31.8 g/dL (ref 30.0–36.0)
MCV: 96.1 fL (ref 80.0–100.0)
Platelets: 77 10*3/uL — ABNORMAL LOW (ref 150–400)
RBC: 2.55 MIL/uL — ABNORMAL LOW (ref 4.22–5.81)
RDW: 17 % — ABNORMAL HIGH (ref 11.5–15.5)
WBC: 6.3 10*3/uL (ref 4.0–10.5)
nRBC: 0 % (ref 0.0–0.2)

## 2019-02-26 LAB — GLUCOSE, CAPILLARY
Glucose-Capillary: 110 mg/dL — ABNORMAL HIGH (ref 70–99)
Glucose-Capillary: 95 mg/dL (ref 70–99)
Glucose-Capillary: 105 mg/dL — ABNORMAL HIGH (ref 70–99)
Glucose-Capillary: 155 mg/dL — ABNORMAL HIGH (ref 70–99)
Glucose-Capillary: 145 mg/dL — ABNORMAL HIGH (ref 70–99)

## 2019-02-26 MED ORDER — AMIODARONE HCL IN DEXTROSE 360-4.14 MG/200ML-% IV SOLN
30.0000 mg/h | INTRAVENOUS | Status: DC
  Administered 2019-02-27 – 2019-03-05 (×12): 30 mg/h via INTRAVENOUS
  Filled 2019-02-26 (×15): qty 200

## 2019-02-26 MED ORDER — ORAL CARE MOUTH RINSE
15.0000 mL | Freq: Two times a day (BID) | OROMUCOSAL | Status: DC
  Administered 2019-02-26 – 2019-03-09 (×23): 15 mL via OROMUCOSAL

## 2019-02-26 MED ORDER — AMIODARONE LOAD VIA INFUSION
150.0000 mg | Freq: Once | INTRAVENOUS | Status: AC
  Administered 2019-02-26: 12:00:00 150 mg via INTRAVENOUS
  Filled 2019-02-26: qty 83.34

## 2019-02-26 MED ORDER — AMIODARONE HCL IN DEXTROSE 360-4.14 MG/200ML-% IV SOLN
INTRAVENOUS | Status: AC
  Filled 2019-02-26: qty 200

## 2019-02-26 MED ORDER — AMIODARONE HCL IN DEXTROSE 360-4.14 MG/200ML-% IV SOLN
60.0000 mg/h | INTRAVENOUS | Status: AC
  Administered 2019-02-26 (×2): 60 mg/h via INTRAVENOUS
  Filled 2019-02-26: qty 200

## 2019-02-26 MED ORDER — CHLORHEXIDINE GLUCONATE CLOTH 2 % EX PADS: 6.0000 | MEDICATED_PAD | Freq: Every day | CUTANEOUS | Status: DC

## 2019-02-26 NOTE — Evaluation (Signed)
SLP Cancellation Note  Patient Details Name: Robert [name]. MRN: [id number] DOB: [birth year]   Cancelled treatment:       Reason Eval/Treat Not Completed: Other (comment)(spoke to RN who reports pt is not ready for eval or po at this time, requested she secure chat SLP when/if he becomes appropriate today otherwise will follow up tomorrow.) Thanks for the order.  Kathleen Lime, MS [facility] SLP Acute Rehab Services Office [phone]    Macario Golds 02/26/2019, 10:24 AM

## 2019-02-26 NOTE — Progress Notes (Addendum)
HD tx initiated and approximately 1 hr into the run. arterial pressure increasing, reassessed Pull and flushed site and aspirated sluggishly.  Arterial access appears clotted/ infiltrated. Nephrology made aware and tx terminated with 2 hrs remaining.  Ice pack provided to site. Pt in no apparent distress. Primary nurse made aware of above.

## 2019-02-26 NOTE — Progress Notes (Signed)
      [address]       [place] [address]             [phone]                 2 Days Post-Op Procedure(s) (LRB): CORONARY ARTERY BYPASS GRAFTING (CABG) using endoscopic greater saphenous vein harvest: SVC to Diag1; SVC to OM1; SVC to PDA and LIMA to LAD. (N/A) Clipping Of Atrial Appendage using 45 MM AtriCure AtriClip (N/A)   Events: No events overnight Still not following commands _______________________________________________________________ Vitals: BP (!) 127/43 (BP Location: Right Arm)   Pulse 81   Temp 97.8 F (36.6 C) (Oral)   Resp 14   Ht 5\' 8"  (1.727 m)   Wt 73 kg   SpO2 100%   BMI 24.47 kg/m   - Neuro: awake, non-focal  - Cardiovascular: sinus   PAP: (34-168)/(7-35) 168/35 CVP:  [3 mmHg-10 mmHg] 6 mmHg  - Pulm: EWOB    ABG    Component Value Date/Time   PHART 7.323 (L) 02/25/2019 1150   PCO2ART 32.3 02/25/2019 1150   PO2ART 61.0 (L) 02/25/2019 1150   HCO3 16.8 (L) 02/25/2019 1150   TCO2 18 (L) 02/25/2019 1150   ACIDBASEDEF 9.0 (H) 02/25/2019 1150   O2SAT 90.0 02/25/2019 1150    - Abd: soft - Extremity: warm  .Intake/Output      12/31 0701 - 01/01 0700 01/01 0701 - 01/02 0700   I.V. (mL/kg) 494.4 (6.8) 40 (0.5)   Blood     IV Piggyback 150    Total Intake(mL/kg) 644.4 (8.8) 40 (0.5)   Urine (mL/kg/hr) 0 (0)    Stool 0    Blood     Chest Tube 170    Total Output 170    Net +474.4 +40        Stool Occurrence 1 x       _______________________________________________________________ Labs: CBC Latest Ref Rng & Units 02/26/2019 02/25/2019 02/25/2019  WBC 4.0 - 10.5 K/uL 6.3 6.2 -  Hemoglobin 13.0 - 17.0 g/dL 7.8(L) 8.2(L) 6.8(LL)  Hematocrit 39.0 - 52.0 % 24.5(L) 24.6(L) 20.0(L)  Platelets 150 - 400 K/uL 77(L) 84(L) -   CMP Latest Ref Rng & Units 02/26/2019 02/25/2019 02/25/2019  Glucose 70 - 99 mg/dL 110(H) 159(H) -  BUN 8 - 23 mg/dL 32(H) 27(H) -  Creatinine 0.61 - 1.24 mg/dL 5.43(H) 4.61(H) -  Sodium 135 - 145  mmol/L 146(H) 145 145  Potassium 3.5 - 5.1 mmol/L 4.7 4.5 4.4  Chloride 98 - 111 mmol/L 113(H) 114(H) -  CO2 22 - 32 mmol/L 21(L) 18(L) -  Calcium 8.9 - 10.3 mg/dL 8.4(L) 7.9(L) -  Total Protein 6.5 - 8.1 g/dL - - -  Total Bilirubin 0.3 - 1.2 mg/dL - - -  Alkaline Phos 38 - 126 U/L - - -  AST 15 - 41 U/L - - -  ALT 0 - 44 U/L - - -    CXR: Clear bases, some PV congestion  _______________________________________________________________  Assessment and Plan: POD 2 s/p CABG Atriclip  Neuro: off sedation.  Some baseline dementia and hearing problems.  Will continue to re-orient.  Likely metabolic in nature.   CV: good hemodynamics.  A/S/BB today Pulm: continue pulm toilet Renal: likely dialysis today GI: NPO for now, given mental status Heme: stable ID: [email or web address] Endo: SSI  Dispo: continue ICU care  Melodie Bouillon, MD 02/26/2019 8:42 AM

## 2019-02-26 NOTE — Progress Notes (Signed)
[facility] Progress Note   Subjective: extubated yest, confused thsi am  Objective Vitals:   02/26/19 1030 02/26/19 1045 02/26/19 1100 02/26/19 1129  BP: (!) 137/56 (!) 137/51 (!) 140/52   Pulse: 84 88 85   Resp: 18 17 19    Temp:  98.1 F (36.7 C)  98.1 F (36.7 C)  TempSrc:  Oral  Axillary  SpO2: 99% 95% 98%   Weight:      Height:        Physical Exam General: extubated, half awake not responding much Heart: RRR systolic murmur  Lungs: Clear bilaterally  Abdomen: soft non-tender  Extremities: 2+ LE/ UE edema Dialysis Access: LUE AVF +bruit   Dialysis:  [facility] TTS 4h   400/800    73.5kg    3/2.25Ca bath  P4  Hep none L AVF Hectorol 4 Venofer 50 Mircera 75 (last 12/15)   Assessment/Plan: 1. CAD s/p stents/Elevated troponin. LHC with severe multivessel CAD.  s/p CABG x 4 yest 12/30. Extubated. Confused.  2. ESRD -  HD TTS. Dialyzed 12.29. Plan for HD w/ modest UF today and tomorrow.  3. Hyperkalemia - K+ better 4. Atrial fibrillation - per cards/ TCTS 5. Hypertension/volume - sig edema on exam, wt's don't appear accurate, will presume he is up 5- 10 kg post cabg 6. Anemia abl/ ckd  - Hgb down post op 6.1, sp 3u prbc total so far. Resume ESA today 60 ug darbe. Hb 7.8 today.   7. Metabolic bone disease -  Continue usual binders/Hectorol  8. Nutrition - Renal diet/vitamins   [name] Splinter, MD 02/26/2019, 12:01 PM       Medications: . sodium chloride    . sodium chloride    . sodium chloride    . sodium chloride 10 mL/hr at 02/26/19 1000  . amiodarone     Followed by  . amiodarone    . amiodarone    . dexmedetomidine (PRECEDEX) IV infusion Stopped (02/25/19 0846)  . insulin Stopped (02/25/19 1153)  . magnesium sulfate    . niCARDipine Stopped (02/15/2019 1558)  . nitroGLYCERIN Stopped (01/29/2019 1712)  . norepinephrine (LEVOPHED) Adult infusion Stopped (02/25/19 1604)  . phenylephrine (NEO-SYNEPHRINE) Adult infusion Stopped (02/25/19 0435)   .  sodium chloride   Intravenous Once  . sodium chloride   Intravenous Once  . acetaminophen  1,000 mg Oral Q6H   Or  . acetaminophen (TYLENOL) oral liquid 160 mg/5 mL  1,000 mg Per Tube Q6H  . amiodarone  150 mg Intravenous Once  . aspirin EC  325 mg Oral Daily   Or  . aspirin  324 mg Per Tube Daily  . bisacodyl  10 mg Oral Daily   Or  . bisacodyl  10 mg Rectal Daily  . calcium acetate  1,334 mg Oral TID WC  . Chlorhexidine Gluconate Cloth  6 each Topical Q0600  . Chlorhexidine Gluconate Cloth  6 each Topical Q0600  . docusate sodium  200 mg Oral Daily  . enoxaparin (LOVENOX) injection  30 mg Subcutaneous QHS  . insulin aspart  0-24 Units Subcutaneous Q4H  . levothyroxine  50 mcg Oral Q0600  . mouth rinse  15 mL Mouth Rinse BID  . metoprolol tartrate  12.5 mg Oral BID   Or  . metoprolol tartrate  12.5 mg Per Tube BID  . mupirocin ointment  1 application Nasal BID  . pantoprazole  40 mg Oral Daily  . simvastatin  40 mg Oral q1800  . sodium chloride flush  10-40 mL Intracatheter Q12H  . sodium chloride flush  3 mL Intravenous Q12H

## 2019-02-26 NOTE — Progress Notes (Addendum)
Subjective:  Post CABG rhythm stable in sinus extubated for dialysis today  Objective:  Vitals:   02/26/19 0023 02/26/19 0100 02/26/19 0300 02/26/19 0400  BP:  (!) 138/57 126/60 (!) 123/52  Pulse:  78 77 74  Resp:  19 (!) 22 15  Temp:    98.4 F (36.9 C)  TempSrc:    Oral  SpO2: 100% 100% 100% 100%  Weight:      Height:        Intake/Output from previous day:  Intake/Output Summary (Last 24 hours) at 02/26/2019 0620 Last data filed at 02/26/2019 0400 Gross per 24 hour  Intake 644.42 ml  Output 100 ml  Net 544.42 ml    Physical Exam: Pale elderly male  Left IJ Lungs clear anteriorly Post sternotomy Echymosis right groin Pedal pulses intact Abdomen soft    Lab Results: Basic Metabolic Panel: Recent Labs    01/31/2019 0545 02/05/2019 0815 02/25/19 0455 02/25/19 1415 02/26/19 0420  NA 140  --  144 145 146*  K 4.5  --  5.0 4.5 4.7  CL 102  --  115* 114* 113*  CO2 24   < > 19* 18* 21*  GLUCOSE 95  --  147* 159* 110*  BUN 16  --  21 27* 32*  CREATININE 3.61*  --  4.09* 4.61* 5.43*  CALCIUM 8.7*   < > 7.7* 7.9* 8.4*  MG 2.0   < > 1.9 1.8  --   PHOS 2.9  --   --   --   --    < > = values in this interval not displayed.   Liver Function Tests: Recent Labs    01/26/2019 0545  ALBUMIN 3.3*   No results for input(s): LIPASE, AMYLASE in the last 72 hours. CBC: Recent Labs    02/02/2019 0545 02/21/2019 0815 02/25/19 1415 02/26/19 0420  WBC 6.2   < > 6.2 6.3  NEUTROABS 4.3  --   --   --   HGB 11.6*  --  8.2* 7.8*  HCT 36.0*  --  24.6* 24.5*  MCV 96.5   < > 93.5 96.1  PLT 118*   < > 84* 77*   < > = values in this interval not displayed.   Cardiac Enzymes: No results for input(s): CKTOTAL, CKMB, CKMBINDEX, TROPONINI in the last 72 hours. BNP: Invalid input(s): POCBNP D-Dimer: No results for input(s): DDIMER in the last 72 hours. Hemoglobin A1C: No results for input(s): HGBA1C in the last 72 hours. Fasting Lipid Panel: Recent Labs    02/16/2019 0545    CHOL 112  HDL 29*  LDLCALC 52  TRIG 157*  CHOLHDL 3.9   Thyroid Function Tests: Recent Labs    02/08/2019 1400  TSH 3.084   Anemia Panel: No results for input(s): VITAMINB12, FOLATE, FERRITIN, TIBC, IRON, RETICCTPCT in the last 72 hours.  Imaging: DG Chest Port 1 View  Result Date: 02/25/2019 CLINICAL DATA:  Chest tube.  CABG. EXAM: PORTABLE CHEST 1 VIEW COMPARISON:  Yesterday FINDINGS: Endotracheal tube tip below the clavicular heads. The enteric tube is no longer seen. Swan-Ganz catheter from left IJ approach with tip at the outflow tract. Chest tubes in place. Mild atelectatic type opacity. No pneumothorax. There could be trace pleural fluid on the left. Stable postoperative heart size. IMPRESSION: 1. Stable remaining hardware. 2. Stable atelectasis. Electronically Signed   By: Monte Fantasia M.D.   On: 02/25/2019 09:09   DG Chest Port 1 View  Result Date:  02/04/2019 CLINICAL DATA:  Status post coronary bypass grafting EXAM: PORTABLE CHEST 1 VIEW COMPARISON:  01/30/2019 FINDINGS: Cardiac shadow is stable. Loop recorder is again noted. Postsurgical changes are seen consistent with the given clinical history. Endotracheal tube, gastric catheter and Swan-Ganz catheter are noted. Mediastinal drain and left-sided thoracostomy tube are noted. No pneumothorax is seen. No focal infiltrate is noted. Mild central vascular congestion is seen. There is a rectangular density identified adjacent to the tip of the Swan-Ganz catheter. This may be extrinsic to the patient IMPRESSION: Tubes and lines as described above. Mild vascular congestion. Linear density overlying the cardiac shadow which is new from the prior exam. This may be extrinsic to the patient. Clinical correlation is recommended. Electronically Signed   By: Inez Catalina M.D.   On: 02/11/2019 15:23   ECHO INTRAOPERATIVE TEE  Result Date: 02/06/2019  *INTRAOPERATIVE TRANSESOPHAGEAL REPORT *  Patient Name:   Robert [name]. Date of  Exam: 01/27/2019 Medical Rec #:  [id number]            Height:       68.0 in Accession #:    [id number]           Weight:       160.9 lb Date of Birth:  19-Nov-1935            BSA:          1.86 m Patient Age:    3 years             BP:           120/80 mmHg Patient Gender: M                    HR:           72 bpm. Exam Location:  Anesthesiology Transesophogeal exam was perform intraoperatively during surgical procedure. Patient was closely monitored under general anesthesia during the entirety of examination. Indications:     Coronary artery disease Sonographer:     Paulita Fujita RDCS Performing Phys: [id number] Lucile Crater LIGHTFOOT Diagnosing Phys: Nolon Nations MD Complications: No known complications during this procedure. POST-OP IMPRESSIONS - Left Ventricle: The left ventricle is unchanged from pre-bypass. - Aorta: The aorta appears unchanged from pre-bypass. - Aortic Valve: The aortic valve appears unchanged from pre-bypass. - Mitral Valve: The mitral valve appears unchanged from pre-bypass. - Tricuspid Valve: The tricuspid valve appears unchanged from pre-bypass. PRE-OP FINDINGS  Left Ventricle: The left ventricle has normal systolic function, with an ejection fraction of 55-60%. The cavity size was normal. There is moderate concentric left ventricular hypertrophy. Right Ventricle: The right ventricle has normal systolic function. The cavity was normal. There is no increase in right ventricular wall thickness. Left Atrium: Left atrial size was normal in size. Right Atrium: Right atrial size was normal in size. Right atrial pressure is estimated at 10 mmHg. Interatrial Septum: No obvious shunts. Pericardium: There is no evidence of pericardial effusion. Mitral Valve: The mitral valve is normal in structure. Moderate thickening of the mitral valve leaflet. Moderate calcification of the mitral valve leaflet. Mitral valve regurgitation is trivial by color flow Doppler. There is no evidence of mitral valve  vegetation. Tricuspid Valve: The tricuspid valve was normal in structure. Tricuspid valve regurgitation is trivial by color flow Doppler. No TV vegetation was visualized. Aortic Valve: The aortic valve is tricuspid There is moderate thickening of the aortic valve and There is severe calcifcation of the aortic valve Aortic valve  regurgitation is trivial by color flow Doppler. There is mild stenosis of the aortic valve. Pulmonic Valve: The pulmonic valve was normal in structure. Pulmonic valve regurgitation is trivial by color flow Doppler. Aorta: There is evidence of atheroma plaque in the ascending aorta, aortic arch and descending aorta; Grade III, measuring 3-4m in size. +-------------+---------++ AORTIC VALVE           +-------------+---------++ AV Mean Grad:15.0 mmHg +-------------+---------++  JNolon NationsMD Electronically signed by JNolon NationsMD Signature Date/Time: 02/05/2019/3:56:25 PM    Final     Cardiac Studies:  ECG:  Admission afib rapid rate diffuse ST deprssion    Telemetry:  NSR   Echo: EF 60-65% moderate AS mean gradient 16 mmHg  Medications:   . sodium chloride   Intravenous Once  . sodium chloride   Intravenous Once  . acetaminophen  1,000 mg Oral Q6H   Or  . acetaminophen (TYLENOL) oral liquid 160 mg/5 mL  1,000 mg Per Tube Q6H  . aspirin EC  325 mg Oral Daily   Or  . aspirin  324 mg Per Tube Daily  . bisacodyl  10 mg Oral Daily   Or  . bisacodyl  10 mg Rectal Daily  . calcium acetate  1,334 mg Oral TID WC  . chlorhexidine gluconate (MEDLINE KIT)  15 mL Mouth Rinse BID  . Chlorhexidine Gluconate Cloth  6 each Topical Q0600  . Chlorhexidine Gluconate Cloth  6 each Topical Q0600  . docusate sodium  200 mg Oral Daily  . enoxaparin (LOVENOX) injection  30 mg Subcutaneous QHS  . insulin aspart  0-24 Units Subcutaneous Q4H  . levothyroxine  50 mcg Oral Q0600  . mouth rinse  15 mL Mouth Rinse 10 times per day  . metoprolol tartrate  12.5 mg Oral BID   Or    . metoprolol tartrate  12.5 mg Per Tube BID  . midodrine  5 mg Oral TID WC  . mupirocin ointment  1 application Nasal BID  . pantoprazole  40 mg Oral Daily  . simvastatin  40 mg Oral q1800  . sodium chloride flush  10-40 mL Intracatheter Q12H  . sodium chloride flush  3 mL Intravenous Q12H     . sodium chloride    . sodium chloride    . sodium chloride    . sodium chloride 10 mL/hr at 02/26/19 0400  . cefUROXime (ZINACEF)  IV Stopped (02/25/19 1059)  . dexmedetomidine (PRECEDEX) IV infusion Stopped (02/25/19 0846)  . DOBUTamine Stopped (02/25/19 1025)  . insulin Stopped (02/25/19 1153)  . magnesium sulfate    . niCARDipine Stopped (01/30/2019 1558)  . nitroGLYCERIN Stopped (02/20/2019 1712)  . norepinephrine (LEVOPHED) Adult infusion Stopped (02/25/19 1604)  . phenylephrine (NEO-SYNEPHRINE) Adult infusion Stopped (02/25/19 0435)    Assessment/Plan:   1. PAF:  Maintaining NSR post CABG will need oral DOAC on d/c  2. CAD:  LM and distal RCA EF preserved post CABG doing well  3. AS:  Agree with age and co morbidities that AVR not indicated can follow AS and consider TAVR if needed at latter date   Dr ARayann Hemanand Nahser covering service today   PJenkins Rouge1/02/2019, 6:20 AM

## 2019-02-26 NOTE — Progress Notes (Signed)
TCTS BRIEF SICU PROGRESS NOTE  2 Days Post-Op  S/P Procedure(s) (LRB): CORONARY ARTERY BYPASS GRAFTING (CABG) using endoscopic greater saphenous vein harvest: SVC to Diag1; SVC to OM1; SVC to PDA and LIMA to LAD. (N/A) Clipping Of Atrial Appendage using 45 MM AtriCure AtriClip (N/A)   Stable day Tolerated HD reportedly 1.5 L volume off Mental status perhaps slightly less somnolent but still w/out purposeful movements and will not follow commands  Plan: Continue current plan  Rexene Alberts, MD 02/26/2019 6:35 PM

## 2019-02-26 DEATH — deceased

## 2019-02-27 DIAGNOSIS — I4819 Other persistent atrial fibrillation: Secondary | ICD-10-CM

## 2019-02-27 LAB — TYPE AND SCREEN
ABO/RH(D): A NEG
Antibody Screen: NEGATIVE
Unit division: 0
Unit division: 0
Unit division: 0
Unit division: 0
Unit division: 0
Unit division: 0

## 2019-02-27 LAB — BPAM RBC
Blood Product Expiration Date: 202101082359
Blood Product Expiration Date: 202101112359
Blood Product Expiration Date: 202101122359
Blood Product Expiration Date: 202101132359
Blood Product Expiration Date: 202101132359
Blood Product Expiration Date: 202101152359
ISSUE DATE / TIME: 202012300851
ISSUE DATE / TIME: 202012300851
ISSUE DATE / TIME: 202012301039
ISSUE DATE / TIME: 202012310130
ISSUE DATE / TIME: 202012310219
Unit Type and Rh: 600
Unit Type and Rh: 600
Unit Type and Rh: 600
Unit Type and Rh: 600
Unit Type and Rh: 600
Unit Type and Rh: 600

## 2019-02-27 LAB — GLUCOSE, CAPILLARY
Glucose-Capillary: 100 mg/dL — ABNORMAL HIGH (ref 70–99)
Glucose-Capillary: 102 mg/dL — ABNORMAL HIGH (ref 70–99)
Glucose-Capillary: 113 mg/dL — ABNORMAL HIGH (ref 70–99)
Glucose-Capillary: 114 mg/dL — ABNORMAL HIGH (ref 70–99)
Glucose-Capillary: 118 mg/dL — ABNORMAL HIGH (ref 70–99)
Glucose-Capillary: 126 mg/dL — ABNORMAL HIGH (ref 70–99)
Glucose-Capillary: 141 mg/dL — ABNORMAL HIGH (ref 70–99)

## 2019-02-27 LAB — BASIC METABOLIC PANEL
Anion gap: 15 (ref 5–15)
BUN: 32 mg/dL — ABNORMAL HIGH (ref 8–23)
CO2: 20 mmol/L — ABNORMAL LOW (ref 22–32)
Calcium: 8.4 mg/dL — ABNORMAL LOW (ref 8.9–10.3)
Chloride: 107 mmol/L (ref 98–111)
Creatinine, Ser: 5.55 mg/dL — ABNORMAL HIGH (ref 0.61–1.24)
GFR calc Af Amer: 10 mL/min — ABNORMAL LOW (ref >60)
GFR calc non Af Amer: 9 mL/min — ABNORMAL LOW (ref >60)
Glucose, Bld: 122 mg/dL — ABNORMAL HIGH (ref 70–99)
Potassium: 4.5 mmol/L (ref 3.5–5.1)
Sodium: 142 mmol/L (ref 135–145)

## 2019-02-27 LAB — CBC
HCT: 22.1 % — ABNORMAL LOW (ref 39.0–52.0)
Hemoglobin: 7.1 g/dL — ABNORMAL LOW (ref 13.0–17.0)
MCH: 31.1 pg (ref 26.0–34.0)
MCHC: 32.1 g/dL (ref 30.0–36.0)
MCV: 96.9 fL (ref 80.0–100.0)
Platelets: 76 10*3/uL — ABNORMAL LOW (ref 150–400)
RBC: 2.28 MIL/uL — ABNORMAL LOW (ref 4.22–5.81)
RDW: 16.9 % — ABNORMAL HIGH (ref 11.5–15.5)
WBC: 6.5 10*3/uL (ref 4.0–10.5)
nRBC: 0 % (ref 0.0–0.2)

## 2019-02-27 LAB — SURGICAL PCR SCREEN
MRSA, PCR: NEGATIVE
Staphylococcus aureus: NEGATIVE

## 2019-02-27 LAB — PREPARE RBC (CROSSMATCH)

## 2019-02-27 MED ORDER — SODIUM CHLORIDE 0.9% IV SOLUTION: Freq: Once | INTRAVENOUS | Status: AC

## 2019-02-27 MED ORDER — NOREPINEPHRINE 4 MG/250ML-% IV SOLN
0.0000 ug/min | INTRAVENOUS | Status: DC
  Administered 2019-02-27: 19:00:00 2 ug/min via INTRAVENOUS

## 2019-02-27 MED ORDER — DILTIAZEM HCL-DEXTROSE 125-5 MG/125ML-% IV SOLN (PREMIX)
5.0000 mg/h | INTRAVENOUS | Status: DC
  Administered 2019-02-27: 19:00:00 5 mg/h via INTRAVENOUS
  Administered 2019-02-28: 06:00:00 15 mg/h via INTRAVENOUS
  Filled 2019-02-27 (×4): qty 125

## 2019-02-27 MED ORDER — AMIODARONE LOAD VIA INFUSION
150.0000 mg | Freq: Once | INTRAVENOUS | Status: AC
  Administered 2019-02-27: 17:00:00 150 mg via INTRAVENOUS
  Filled 2019-02-27: qty 83.34

## 2019-02-27 MED ORDER — AMIODARONE IV BOLUS ONLY 150 MG/100ML
150.0000 mg | Freq: Once | INTRAVENOUS | Status: AC
  Administered 2019-02-27: 12:00:00 150 mg via INTRAVENOUS

## 2019-02-27 NOTE — Progress Notes (Addendum)
      [address]       [place] [address]             [phone]        CARDIOTHORACIC SURGERY PROGRESS NOTE   R3 Days Post-Op Procedure(s) (LRB): CORONARY ARTERY BYPASS GRAFTING (CABG) using endoscopic greater saphenous vein harvest: SVC to Diag1; SVC to OM1; SVC to PDA and LIMA to LAD. (N/A) Clipping Of Atrial Appendage using 45 MM AtriCure AtriClip (N/A)  Subjective: Somewhat more alert but still will not follow any commands.  Moves all 4 extremities and opens eyes spontaneously but not to command  Objective: Vital signs: BP Readings from Last 1 Encounters:  02/27/19 (!) 149/55   Pulse Readings from Last 1 Encounters:  02/27/19 82   Resp Readings from Last 1 Encounters:  02/27/19 18   Temp Readings from Last 1 Encounters:  02/27/19 98.4 F (36.9 C) (Axillary)    Hemodynamics:    Physical Exam:  Rhythm:   sinus  Breath sounds: Fairly clear  Heart sounds:  RRR  Incisions:  Dressing dry, intact  Abdomen:  Soft, non-distended, quiet  Extremities:  Warm, well-perfused   Intake/Output from previous day: 01/01 0701 - 01/02 0700 In: 564.9 [I.V.:464.9; IV Piggyback:100] Out: 1900  Intake/Output this shift: No intake/output data recorded.  Lab Results:  CBC: Recent Labs    02/26/19 0420 02/27/19 0437  WBC 6.3 6.5  HGB 7.8* 7.1*  HCT 24.5* 22.1*  PLT 77* 76*    BMET:  Recent Labs    02/26/19 0420 02/27/19 0437  NA 146* 142  K 4.7 4.5  CL 113* 107  CO2 21* 20*  GLUCOSE 110* 122*  BUN 32* 32*  CREATININE 5.43* 5.55*  CALCIUM 8.4* 8.4*     PT/INR:   Recent Labs    02/21/2019 1541  LABPROT 19.1*  INR 1.6*    CBG (last 3)  Recent Labs    02/27/19 0037 02/27/19 0336 02/27/19 0827  GLUCAP 113* 118* 126*    ABG    Component Value Date/Time   PHART 7.323 (L) 02/25/2019 1150   PCO2ART 32.3 02/25/2019 1150   PO2ART 61.0 (L) 02/25/2019 1150   HCO3 16.8 (L) 02/25/2019 1150   TCO2 18 (L) 02/25/2019 1150   ACIDBASEDEF  9.0 (H) 02/25/2019 1150   O2SAT 90.0 02/25/2019 1150    CXR: n/a  Assessment/Plan: S/P Procedure(s) (LRB): CORONARY ARTERY BYPASS GRAFTING (CABG) using endoscopic greater saphenous vein harvest: SVC to Diag1; SVC to OM1; SVC to PDA and LIMA to LAD. (N/A) Clipping Of Atrial Appendage using 45 MM AtriCure AtriClip (N/A)  Overall stable POD3 Maintaining NSR w/ stable BP although some recurrent episodes PAF on IV amiodarone Breathing comfortably w/ O2 sats 97-100% on 2 L/min Encephalopathy persists, presumably toxic/metabolic - neuro exam non-focal ESRD on HD, tolerated HD yesterday and for HD again today, weight reportedly 7 kg > preop Expected post op acute blood loss anemia, Hgb down further 7.1, will transfuse PRBCs during HD Post op thrombocytopenia, will hold lovenox Consider feeding tube placement if mental status doesn't improve within 24-48 hours   Robert Alberts, MD 02/27/2019 10:29 AM

## 2019-02-27 NOTE — Progress Notes (Signed)
TCTS BRIEF SICU PROGRESS NOTE  3 Days Post-Op  S/P Procedure(s) (LRB): CORONARY ARTERY BYPASS GRAFTING (CABG) using endoscopic greater saphenous vein harvest: SVC to Diag1; SVC to OM1; SVC to PDA and LIMA to LAD. (N/A) Clipping Of Atrial Appendage using 45 MM AtriCure AtriClip (N/A)   No change in mental status Staying in Afib w/ increased HR this afternoon Respiratory status stable but sounds more congested and wet Still waiting for HD  Plan: Will give extra bolus amiodarone, consider adding diltiazem drip for rate control.  Needs HD.    Robert Alberts, MD 02/27/2019 5:10 PM

## 2019-02-27 NOTE — Evaluation (Signed)
Clinical/Bedside Swallow Evaluation Patient Details  Name: Robert [name]. MRN: [id number] Date of Birth: Apr 12, 1935  Today's Date: 02/27/2019 Time: SLP Start Time (ACUTE ONLY): 2426 SLP Stop Time (ACUTE ONLY): 0845 SLP Time Calculation (min) (ACUTE ONLY): 13 min  Past Medical History:  Past Medical History:  Diagnosis Date  . Allergy    year round  . Anemia   . Cancer (HCC)    PSA ELEVATED  . Carotid artery occlusion   . Cataract   . Depression   . Diabetes mellitus   . Dialysis patient ([place])   . Dyspnea   . ESRD (end stage renal disease) [facility])    [place]  . GERD (gastroesophageal reflux disease)   . Hearing deficit   . History of kidney stones   . Hyperlipidemia   . Hypertension   . Hypothyroidism   . Myocardial infarction ([place]) 2011  . Stroke (cerebrum) (HCC)    weakness in left hand  04/2015, 05/20/16  . Thyroid disease    hypothyroid   Past Surgical History:  Past Surgical History:  Procedure Laterality Date  . A/V FISTULAGRAM Right 06/24/2017   Procedure: A/V FISTULAGRAM;  Surgeon: Serafina Mitchell, MD;  Location: [facility] [facility];  Service: Cardiovascular;  Laterality: Right;  rt  lower arm  . AV FISTULA PLACEMENT Right 07/25/2016   Procedure: ARTERIOVENOUS (AV) FISTULA CREATION-RIGHT;  Surgeon: Serafina Mitchell, MD;  Location: [place];  Service: Vascular;  Laterality: Right;  . BASCILIC VEIN TRANSPOSITION Left 10/15/2017   Procedure: FIRST STAGE BASILIC VEIN TRANSPOSITION LEFT UPPER EXTREMITY;  Surgeon: Serafina Mitchell, MD;  Location: [facility];  Service: Vascular;  Laterality: Left;  . BASCILIC VEIN TRANSPOSITION Left 12/10/2017   Procedure: SECOND STAGE BASILIC VEIN TRANSPOSITION LEFT ARM;  Surgeon: Serafina Mitchell, MD;  Location: [facility];  Service: Vascular;  Laterality: Left;  . CLIPPING OF ATRIAL APPENDAGE N/A 02/02/2019   Procedure: Clipping Of Atrial Appendage using 79 MM AtriCure AtriClip;  Surgeon: [name], MD;  Location: [facility];   Service: Open Heart Surgery;  Laterality: N/A;  . COLONOSCOPY    . CORONARY ANGIOPLASTY WITH STENT PLACEMENT  07/2009   3 stents  . CORONARY ARTERY BYPASS GRAFT N/A 02/19/2019   Procedure: CORONARY ARTERY BYPASS GRAFTING (CABG) using endoscopic greater saphenous vein harvest: SVC to Diag1; SVC to OM1; SVC to PDA and LIMA to LAD.;  Surgeon: [name], MD;  Location: [facility];  Service: Open Heart Surgery;  Laterality: N/A;  . ELBOW SURGERY     LEFT  . ENDARTERECTOMY Right 06/06/2016   Procedure: RIGHT CAROTID ENDARTERECTOMY WITH PATCH ANGIOPLASTY;  Surgeon: Serafina Mitchell, MD;  Location: [facility];  Service: Vascular;  Laterality: Right;  . EP IMPLANTABLE DEVICE N/A 05/16/2015   Procedure: Loop Recorder Insertion;  Surgeon: Thompson Grayer, MD;  Location: [facility];  Service: Cardiovascular;  Laterality: N/A;  . ESOPHAGOSCOPY WITH DILITATION    . FISTULA SUPERFICIALIZATION Right 12/05/2016   Procedure: Superficialization and Ligation of Branches OF RIGHT ARM RADIOCEPHALIC FISTULA;  Surgeon: Serafina Mitchell, MD;  Location: [facility];  Service: Vascular;  Laterality: Right;  . LEFT HEART CATH AND CORONARY ANGIOGRAPHY N/A 02/07/2019   Procedure: LEFT HEART CATH AND CORONARY ANGIOGRAPHY;  Surgeon: Leonie Man, MD;  Location: [facility];  Service: Cardiovascular;  Laterality: N/A;  . NOSE SURGERY    . PERIPHERAL VASCULAR BALLOON ANGIOPLASTY  06/24/2017   Procedure: PERIPHERAL VASCULAR BALLOON ANGIOPLASTY;  Surgeon: Serafina Mitchell, MD;  Location: [facility];  Service: Cardiovascular;;  rt arm fistula   . stents    . TEE WITHOUT CARDIOVERSION N/A 05/16/2015   Procedure: TRANSESOPHAGEAL ECHOCARDIOGRAM (TEE);  Surgeon: Thayer Headings, MD;  [facility];  Service: Cardiovascular;  Laterality: N/A;   HPI:  Robert [name] has PMH: stroke, 2018, GERD, esophageal dilation 03/18/2010, ESRD, DM, MI, CAD. The patient presented with generalized weakness and tachycardia.  Underwent CABG x 4 12/30, intubawted 12/30-12/31. CXR significant for cardiomegaly with no acute changes. New onset A-fib. MBS 2018 following stroke recommending puree, thin (vocal cord penetration).     Assessment / Plan / Recommendation Clinical Impression  From a cognitive standpoint he is not ready to initiate po's. He is moaning, swatting and moving head at all attempts at oral care requirng RN assist to keep head/hands still. He did not follow commands or maek any verbal communicative intent. Vocalization with moan is clear with adequate intensity. SLP able to remove mild amount dried labial secretions but incomplete remove from tongue. Poor awareness of ice chip and made no attempts to manipulate- melted without swallow initiated. Continue NPO with oral care attempts. ST will continue to intervene and make appropriate recommendations moving toward po when able.       SLP Visit Diagnosis: Dysphagia, unspecified (R13.10)    Aspiration Risk  Moderate aspiration risk    Diet Recommendation NPO   Medication Administration: Via alternative means    Other  Recommendations Oral Care Recommendations: Oral care QID   Follow up Recommendations (TBD)      Frequency and Duration min 2x/week  2 weeks       Prognosis Prognosis for Safe Diet Advancement: Good Barriers to Reach Goals: Cognitive deficits      Swallow Study   General HPI: Robert [name] has PMH: stroke, 2018, GERD, esophageal dilation 03/18/2010, ESRD, DM, MI, CAD. The patient presented with generalized weakness and tachycardia. Underwent CABG x 4 12/30, intubawted 12/30-12/31. CXR significant for cardiomegaly with no acute changes. New onset A-fib. MBS 2018 following stroke recommending puree, thin (vocal cord penetration).   Type of Study: Bedside Swallow Evaluation Previous Swallow Assessment: (see HPI) Diet Prior to this Study: NPO Temperature Spikes Noted: No Respiratory Status: Nasal cannula History of Recent Intubation:  Yes Length of Intubations (days): (1-2 for surgery) Date extubated: 02/25/19 Behavior/Cognition: Lethargic/Drowsy;Distractible;Confused;Agitated;Requires cueing Oral Cavity Assessment: Dry;Dried secretions Oral Care Completed by SLP: Yes(attempted) Oral Cavity - Dentition: Other (Comment)(will assess further) Self-Feeding Abilities: Total assist Patient Positioning: Upright in bed Baseline Vocal Quality: Normal Volitional Cough: Cognitively unable to elicit Volitional Swallow: Unable to elicit    Oral/Motor/Sensory Function Overall Oral Motor/Sensory Function: Generalized oral weakness   Ice Chips Ice chips: Impaired Presentation: Spoon Oral Phase Impairments: Reduced lingual movement/coordination;Poor awareness of bolus Oral Phase Functional Implications: Oral holding Pharyngeal Phase Impairments: (no swallow)   Thin Liquid Thin Liquid: Not tested    Nectar Thick Nectar Thick Liquid: Not tested   Honey Thick Honey Thick Liquid: Not tested   Puree Puree: Not tested   Solid     Solid: Not tested      Houston Siren 02/27/2019,8:58 AM  Orbie Pyo Colvin Caroli.Ed Risk analyst [phone] Office [phone]

## 2019-02-27 NOTE — Progress Notes (Signed)
[facility] Progress Note   Subjective: less lethargic, semi-awake today.   Objective Vitals:   02/27/19 0845 02/27/19 0900 02/27/19 0915 02/27/19 0930  BP:  (!) 126/50  (!) 119/46  Pulse: 77 69 71 73  Resp: 19 14 13 15   Temp:      TempSrc:      SpO2: 97% 97% 99% 98%  Weight:      Height:        Physical Exam General: more responsive, HOH Heart: RRR systolic murmur  Lungs: Clear bilaterally  Abdomen: soft non-tender  Extremities: 2+ LE/ UE edema, improving Dialysis Access: LUE AVF +bruit   Dialysis:  [facility] TTS 4h   400/800    73.5kg    3/2.25Ca bath  P4  Hep none L AVF Hectorol 4 Venofer 50 Mircera 75 (last 12/15)   Assessment/Plan: 1. CAD s/p stents/Elevated troponin. LHC with severe multivessel CAD.  s/p CABG x 4 12/30 2. AMS - combination metabolic/ underlying prob dementia, better today but still hard to communicate with 3. ESRD -  HD TTS. Dialyzed 12.29. Plan for HD again today 4. Vol excess - pull another 2-2.5 L today, improving 5. Atrial fibrillation - per cards/ TCTS 6. Hypertension - let BP's run a little high so we can pull fluid on hd today 7. Anemia abl/ ckd  - has had 3u prbc postop. Resumed ESA 60 ug darbe. Hb 7.1 today, will transfuse another 1-2 u on HD today.  8. Metabolic bone disease -  Continue usual binders/Hectorol  9. Nutrition - Renal diet/vitamins   [name] Splinter, MD 02/27/2019, 9:57 AM       Medications: . sodium chloride    . sodium chloride    . sodium chloride    . sodium chloride 10 mL/hr at 02/27/19 0700  . amiodarone 30 mg/hr (02/26/19 1855)  . dexmedetomidine (PRECEDEX) IV infusion Stopped (02/25/19 0846)  . insulin Stopped (02/25/19 1153)  . magnesium sulfate    . niCARDipine Stopped (02/14/2019 1558)  . nitroGLYCERIN Stopped (02/15/2019 1712)  . norepinephrine (LEVOPHED) Adult infusion 2 mcg/min (02/26/19 1211)  . phenylephrine (NEO-SYNEPHRINE) Adult infusion Stopped (02/25/19 0435)   . sodium chloride    Intravenous Once  . sodium chloride   Intravenous Once  . acetaminophen  1,000 mg Oral Q6H   Or  . acetaminophen (TYLENOL) oral liquid 160 mg/5 mL  1,000 mg Per Tube Q6H  . aspirin EC  325 mg Oral Daily   Or  . aspirin  324 mg Per Tube Daily  . bisacodyl  10 mg Oral Daily   Or  . bisacodyl  10 mg Rectal Daily  . calcium acetate  1,334 mg Oral TID WC  . Chlorhexidine Gluconate Cloth  6 each Topical Q0600  . Chlorhexidine Gluconate Cloth  6 each Topical Q0600  . Chlorhexidine Gluconate Cloth  6 each Topical Q0600  . docusate sodium  200 mg Oral Daily  . enoxaparin (LOVENOX) injection  30 mg Subcutaneous QHS  . insulin aspart  0-24 Units Subcutaneous Q4H  . levothyroxine  50 mcg Oral Q0600  . mouth rinse  15 mL Mouth Rinse BID  . metoprolol tartrate  12.5 mg Oral BID   Or  . metoprolol tartrate  12.5 mg Per Tube BID  . mupirocin ointment  1 application Nasal BID  . pantoprazole  40 mg Oral Daily  . simvastatin  40 mg Oral q1800  . sodium chloride flush  10-40 mL Intracatheter Q12H  . sodium chloride flush  3 mL Intravenous Q12H

## 2019-02-27 NOTE — Progress Notes (Signed)
Cardiology Progress Note  Patient ID: Robert [name]. MRN: [id number] DOB: 03-27-1935 Date of Encounter: 02/27/2019  Primary Cardiologist: Shirlee More, MD  Subjective  Confused. Awake, Alert, does not follow commands or answer questions. Moves all extremities.   ROS:  All other ROS reviewed and negative. Pertinent positives noted in the HPI.     Inpatient Medications  Scheduled Meds: . sodium chloride   Intravenous Once  . sodium chloride   Intravenous Once  . sodium chloride   Intravenous Once  . sodium chloride   Intravenous Once  . aspirin EC  325 mg Oral Daily   Or  . aspirin  324 mg Per Tube Daily  . Chlorhexidine Gluconate Cloth  6 each Topical Q0600  . Chlorhexidine Gluconate Cloth  6 each Topical Q0600  . Chlorhexidine Gluconate Cloth  6 each Topical Q0600  . insulin aspart  0-24 Units Subcutaneous Q4H  . levothyroxine  50 mcg Oral Q0600  . mouth rinse  15 mL Mouth Rinse BID  . mupirocin ointment  1 application Nasal BID  . sodium chloride flush  10-40 mL Intracatheter Q12H  . sodium chloride flush  3 mL Intravenous Q12H   Continuous Infusions: . sodium chloride    . sodium chloride    . sodium chloride    . sodium chloride 10 mL/hr at 02/27/19 0700  . amiodarone 30 mg/hr (02/27/19 1025)  . insulin Stopped (02/25/19 1153)  . magnesium sulfate    . niCARDipine Stopped (02/06/2019 1558)  . nitroGLYCERIN Stopped (01/28/2019 1712)   PRN Meds: sodium chloride, budesonide (PULMICORT) nebulizer solution, dextrose, metoprolol tartrate, midazolam, morphine injection, ondansetron (ZOFRAN) IV, oxyCODONE, sodium chloride flush, sodium chloride flush   Vital Signs   Vitals:   02/27/19 0900 02/27/19 0915 02/27/19 0930 02/27/19 1000  BP: (!) 126/50  (!) 119/46 (!) 149/55  Pulse: 69 71 73 82  Resp: 14 13 15 18   Temp:      TempSrc:      SpO2: 97% 99% 98% 97%  Weight:      Height:        Intake/Output Summary (Last 24 hours) at 02/27/2019 1042 Last data filed at  02/27/2019 0700 Gross per 24 hour  Intake 409.93 ml  Output 1900 ml  Net -1490.07 ml   Last 3 Weights 02/27/2019 02/19/2019 01/28/2019  Weight (lbs) 176 lb 12.9 oz 160 lb 15 oz 163 lb 2.3 oz  Weight (kg) 80.2 kg 73 kg 74 kg      Telemetry  Overnight telemetry shows paroxysmal Afib with RVR, which I personally reviewed.   Physical Exam   Vitals:   02/27/19 0900 02/27/19 0915 02/27/19 0930 02/27/19 1000  BP: (!) 126/50  (!) 119/46 (!) 149/55  Pulse: 69 71 73 82  Resp: 14 13 15 18   Temp:      TempSrc:      SpO2: 97% 99% 98% 97%  Weight:      Height:         Intake/Output Summary (Last 24 hours) at 02/27/2019 1042 Last data filed at 02/27/2019 0700 Gross per 24 hour  Intake 409.93 ml  Output 1900 ml  Net -1490.07 ml    Last 3 Weights 02/27/2019 02/17/2019 02/18/2019  Weight (lbs) 176 lb 12.9 oz 160 lb 15 oz 163 lb 2.3 oz  Weight (kg) 80.2 kg 73 kg 74 kg    Body mass index is 26.88 kg/m.  General: ill-appearing  Head: Atraumatic, normal size  Eyes: PEERLA, EOMI  Neck:  Supple, no JVD Endocrine: No thryomegaly Cardiac: irregular rhythm, 2/6 SEM Lungs: diminished breath sounds bilaterally   Abd: Soft, nontender, no hepatomegaly  Ext: No edema, pulses 2+ Musculoskeletal: No deformities, BUE and BLE strength normal and equal Skin: Warm and dry, no rashes   Neuro: alert, awake, does not follow commands, spontaneous movement of all extremities    Labs  High Sensitivity Troponin:   Recent Labs  Lab 02/14/2019 1736 02/18/2019 1922 02/25/2019 2200 02/19/2019 0124 02/12/2019 0955  TROPONINIHS 81* 856* 4,043* 4,270* 4,018*     Cardiac EnzymesNo results for input(s): TROPONINI in the last 168 hours. No results for input(s): TROPIPOC in the last 168 hours.  Chemistry Recent Labs  Lab 02/15/2019 1736 01/28/2019 0545 02/20/2019 0815 02/25/19 1415 02/26/19 0420 02/27/19 0437  NA 142 140  --  145 146* 142  K 3.8 4.5  --  4.5 4.7 4.5  CL 103 102  --  114* 113* 107  CO2 25 24   < > 18* 21*  20*  GLUCOSE 233* 95  --  159* 110* 122*  BUN 34* 16  --  27* 32* 32*  CREATININE 6.23* 3.61*  --  4.61* 5.43* 5.55*  CALCIUM 9.5 8.7*   < > 7.9* 8.4* 8.4*  PROT 6.5  --   --   --   --   --   ALBUMIN 3.5 3.3*  --   --   --   --   AST 18  --   --   --   --   --   ALT 19  --   --   --   --   --   ALKPHOS 87  --   --   --   --   --   BILITOT 0.4  --   --   --   --   --   GFRNONAA 8* 15*   < > 11* 9* 9*  GFRAA 9* 17*   < > 13* 11* 10*  ANIONGAP 14 14   < > 13 12 15    < > = values in this interval not displayed.    Hematology Recent Labs  Lab 02/25/19 1415 02/26/19 0420 02/27/19 0437  WBC 6.2 6.3 6.5  RBC 2.63* 2.55* 2.28*  HGB 8.2* 7.8* 7.1*  HCT 24.6* 24.5* 22.1*  MCV 93.5 96.1 96.9  MCH 31.2 30.6 31.1  MCHC 33.3 31.8 32.1  RDW 16.6* 17.0* 16.9*  PLT 84* 77* 76*   BNPNo results for input(s): BNP, PROBNP in the last 168 hours.  DDimer No results for input(s): DDIMER in the last 168 hours.   Radiology  DG Chest Port 1 View  Result Date: 02/26/2019 CLINICAL DATA:  Pleural effusion. EXAM: PORTABLE CHEST 1 VIEW COMPARISON:  February 25, 2019. FINDINGS: Stable cardiomegaly. Status post coronary bypass graft. Endotracheal tube has been removed. Left internal jugular venous sheath remains. No pneumothorax is noted. Right lung is clear. Left-sided chest tube is noted. Mildly increased left pleural effusion is noted with associated basilar atelectasis or infiltrate. Bony thorax is unremarkable. IMPRESSION: Left-sided chest tube is noted without evidence of pneumothorax. Mildly increased left pleural effusion is noted with associated basilar atelectasis or infiltrate. Electronically Signed   By: Marijo Conception M.D.   On: 02/26/2019 09:29    Cardiac Studies  TTE 02/23/2019  1. Left ventricular ejection fraction, by visual estimation, is 60 to 65%. The left ventricle has normal function. There is no left ventricular hypertrophy.  2.  Left ventricular diastolic parameters are consistent  with Grade II diastolic dysfunction (pseudonormalization).  3. The left ventricle has no regional wall motion abnormalities.  4. Elevated LVEDP.  5. Global right ventricle has normal systolic function.The right ventricular size is normal. No increase in right ventricular wall thickness.  6. Left atrial size was severely dilated.  7. Right atrial size was moderately dilated.  8. The mitral valve is normal in structure. Mild mitral valve regurgitation. No evidence of mitral stenosis.  9. The tricuspid valve is normal in structure. 10. The aortic valve is normal in structure. Aortic valve regurgitation is not visualized. Moderate aortic valve stenosis. 11. There is severe calcifcation of the aortic valve. 12. There is severe thickening of the aortic valve. 13. The pulmonic valve was normal in structure. Pulmonic valve regurgitation is trivial. 14. Normal pulmonary artery systolic pressure. 15. The inferior vena cava is dilated in size with <50% respiratory variability, suggesting right atrial pressure of 15 mmHg.  Patient Profile  Robert [name]. is a 84 y.o. male with ESRD, HTN, CAD s/p PCI, Stroke s/p CEA, DM admitted 12/29 for new-onset Afib. Subsequently had NSTEMI and found to have 3vCAD. S/p CABG x 4 01/30/2019.   Assessment & Plan   1. NSTEMI: s/p CABG x 4 02/19/2019.  -LIMA-LAD, SVG-OM1, PDA, D1 -doing well and postop management per CTS -continue ASA/statin   2. Atrial fibrillation -new onset -still having paroxysmal Afib episodes -continue amiodarone drip for now -will plan to transition to oral load once mentation improves -will need DOAC but will hold given thrombocytopenia  3. Moderate AS -follow as outpatient    For questions or updates, please contact [place] Please consult [email or web address] for contact info under   Signed, [place] T. Audie Box, [place]  02/27/2019 10:42 AM

## 2019-02-28 ENCOUNTER — Inpatient Hospital Stay (HOSPITAL_COMMUNITY): Payer: Medicare Other

## 2019-02-28 LAB — BPAM RBC
Blood Product Expiration Date: 202101232359
Blood Product Expiration Date: 202101232359
ISSUE DATE / TIME: 202101021817
ISSUE DATE / TIME: 202101021817
Unit Type and Rh: 600
Unit Type and Rh: 600

## 2019-02-28 LAB — COMPREHENSIVE METABOLIC PANEL
ALT: 21 U/L (ref 0–44)
AST: 28 U/L (ref 15–41)
Albumin: 2.3 g/dL — ABNORMAL LOW (ref 3.5–5.0)
Alkaline Phosphatase: 67 U/L (ref 38–126)
Anion gap: 17 — ABNORMAL HIGH (ref 5–15)
BUN: 24 mg/dL — ABNORMAL HIGH (ref 8–23)
CO2: 19 mmol/L — ABNORMAL LOW (ref 22–32)
Calcium: 8.4 mg/dL — ABNORMAL LOW (ref 8.9–10.3)
Chloride: 103 mmol/L (ref 98–111)
Creatinine, Ser: 4.78 mg/dL — ABNORMAL HIGH (ref 0.61–1.24)
GFR calc Af Amer: 12 mL/min — ABNORMAL LOW (ref >60)
GFR calc non Af Amer: 11 mL/min — ABNORMAL LOW (ref >60)
Glucose, Bld: 156 mg/dL — ABNORMAL HIGH (ref 70–99)
Potassium: 4.2 mmol/L (ref 3.5–5.1)
Sodium: 139 mmol/L (ref 135–145)
Total Bilirubin: 1.7 mg/dL — ABNORMAL HIGH (ref 0.3–1.2)
Total Protein: 4.8 g/dL — ABNORMAL LOW (ref 6.5–8.1)

## 2019-02-28 LAB — TYPE AND SCREEN
ABO/RH(D): A NEG
Antibody Screen: NEGATIVE
Unit division: 0
Unit division: 0

## 2019-02-28 LAB — CBC
HCT: 28.4 % — ABNORMAL LOW (ref 39.0–52.0)
Hemoglobin: 9.5 g/dL — ABNORMAL LOW (ref 13.0–17.0)
MCH: 30.4 pg (ref 26.0–34.0)
MCHC: 33.5 g/dL (ref 30.0–36.0)
MCV: 90.7 fL (ref 80.0–100.0)
Platelets: 84 10*3/uL — ABNORMAL LOW (ref 150–400)
RBC: 3.13 MIL/uL — ABNORMAL LOW (ref 4.22–5.81)
RDW: 18.4 % — ABNORMAL HIGH (ref 11.5–15.5)
WBC: 8 10*3/uL (ref 4.0–10.5)
nRBC: 0.3 % — ABNORMAL HIGH (ref 0.0–0.2)

## 2019-02-28 LAB — GLUCOSE, CAPILLARY
Glucose-Capillary: 116 mg/dL — ABNORMAL HIGH (ref 70–99)
Glucose-Capillary: 117 mg/dL — ABNORMAL HIGH (ref 70–99)
Glucose-Capillary: 137 mg/dL — ABNORMAL HIGH (ref 70–99)
Glucose-Capillary: 139 mg/dL — ABNORMAL HIGH (ref 70–99)
Glucose-Capillary: 147 mg/dL — ABNORMAL HIGH (ref 70–99)
Glucose-Capillary: 153 mg/dL — ABNORMAL HIGH (ref 70–99)
Glucose-Capillary: 160 mg/dL — ABNORMAL HIGH (ref 70–99)

## 2019-02-28 MED ORDER — ENOXAPARIN SODIUM 30 MG/0.3ML ~~LOC~~ SOLN
30.0000 mg | SUBCUTANEOUS | Status: DC
  Administered 2019-03-01 – 2019-03-09 (×5): 30 mg via SUBCUTANEOUS
  Filled 2019-02-28 (×7): qty 0.3

## 2019-02-28 MED ORDER — PANTOPRAZOLE SODIUM 40 MG IV SOLR
40.0000 mg | Freq: Every day | INTRAVENOUS | Status: DC
  Administered 2019-02-28 – 2019-03-04 (×5): 40 mg via INTRAVENOUS
  Filled 2019-02-28 (×5): qty 40

## 2019-02-28 MED ORDER — ENOXAPARIN SODIUM 30 MG/0.3ML ~~LOC~~ SOLN: 30.0000 mg | Freq: Every day | SUBCUTANEOUS | Status: DC

## 2019-02-28 MED ORDER — DILTIAZEM HCL-DEXTROSE 125-5 MG/125ML-% IV SOLN (PREMIX)
5.0000 mg/h | INTRAVENOUS | Status: DC
  Administered 2019-02-28: 18:00:00 7 mg/h via INTRAVENOUS
  Administered 2019-03-01: 7.5 mg/h via INTRAVENOUS
  Administered 2019-03-02: 22:00:00 10 mg/h via INTRAVENOUS
  Administered 2019-03-03: 12.5 mg/h via INTRAVENOUS
  Administered 2019-03-03 – 2019-03-04 (×2): 10 mg/h via INTRAVENOUS
  Administered 2019-03-05: 12:00:00 5 mg/h via INTRAVENOUS
  Filled 2019-02-28 (×7): qty 125

## 2019-02-28 NOTE — Progress Notes (Signed)
  [facility] Progress Note   Subjective: no issues w/ HD yest   Objective Vitals:   02/28/19 0630 02/28/19 0645 02/28/19 0700 02/28/19 0732  BP: (!) 143/50 120/63 (!) 102/59   Pulse: (!) 25 (!) 55 (!) 49   Resp: 20 16 16    Temp:    (!) 97 F (36.1 C)  TempSrc:    Axillary  SpO2: 98% 97% 97%   Weight:      Height:        Physical Exam General: more responsive, HOH Heart: RRR systolic murmur  Lungs: Clear bilaterally  Abdomen: soft non-tender  Extremities: 2+ LE/ UE edema, improving Dialysis Access: LUE AVF +bruit   Dialysis:  [facility] TTS 4h   400/800    73.5kg    3/2.25Ca bath  P4  Hep none L AVF Hectorol 4 Venofer 50 Mircera 75 (last 12/15)   Assessment/Plan: 1. CAD s/p stents/Elevated troponin. LHC with severe multivessel CAD.  s/p CABG x 4 12/30 2. AMS - gradual improvement 3. ESRD -  HD TTS. Had HD Fri and Sat. Next HD tuesday 4. Volume - up 7kg, improving 5. Atrial fibrillation - per cards/ TCTS 6. Hypertension - stable 7. Anemia abl/ ckd  - has had total 5u prbc postop. Resumed ESA 60 ug darbe. Hb 9.5 today 8. Metabolic bone disease -  Continue usual binders/Hectorol  9. Nutrition - Renal diet/vitamins   [name] Splinter, MD 02/28/2019, 9:03 AM       Medications: . sodium chloride    . sodium chloride Stopped (02/27/19 1901)  . amiodarone 30 mg/hr (02/28/19 0700)  . insulin Stopped (02/25/19 1153)  . magnesium sulfate     . aspirin EC  325 mg Oral Daily   Or  . aspirin  324 mg Per Tube Daily  . Chlorhexidine Gluconate Cloth  6 each Topical Q0600  . Chlorhexidine Gluconate Cloth  6 each Topical Q0600  . Chlorhexidine Gluconate Cloth  6 each Topical Q0600  . [START ON 03/01/2019] enoxaparin (LOVENOX) injection  30 mg Subcutaneous Q48H  . insulin aspart  0-24 Units Subcutaneous Q4H  . levothyroxine  50 mcg Oral Q0600  . mouth rinse  15 mL Mouth Rinse BID  . mupirocin ointment  1 application Nasal BID  . pantoprazole (PROTONIX) IV  40 mg  Intravenous Daily  . sodium chloride flush  10-40 mL Intracatheter Q12H  . sodium chloride flush  3 mL Intravenous Q12H

## 2019-02-28 NOTE — Progress Notes (Signed)
TCTS BRIEF SICU PROGRESS NOTE  4 Days Post-Op  S/P Procedure(s) (LRB): CORONARY ARTERY BYPASS GRAFTING (CABG) using endoscopic greater saphenous vein harvest: SVC to Diag1; SVC to OM1; SVC to PDA and LIMA to LAD. (N/A) Clipping Of Atrial Appendage using 45 MM AtriCure AtriClip (N/A)   Stable day More alert this evening, follows some commands and answers simple questions appropriately NSR w/ frequent PAC's, stable BP Breathing comfortably w/ O2 sats 95%  Plan: Continue current plan.  Discussed patient's condition at length with daughter over telephone and son at bedside. All questions answered.  Rexene Alberts, MD 02/28/2019 5:53 PM

## 2019-02-28 NOTE — Progress Notes (Signed)
SLP Cancellation Note  Patient Details Name: Sloane Junkin. MRN: [id number] DOB: 01/22/1936   Cancelled treatment:       Reason Eval/Treat Not Completed: Other (comment). Pt alert, able to state his name and follow commands, but still highly resistant to any interventions. Repeatedly refused any PO, became combative with gentle repositioning and tactile interaction. Absolutely defensive at any attempt to address oral care. He is too strong and alert to attempt total assist oral care. Would not participate in any reasoning. Will continue efforts as mentation improves.    Caedyn Raygoza, Katherene Ponto 02/28/2019, 10:35 AM

## 2019-02-28 NOTE — Progress Notes (Addendum)
      [address]       [place] [address]             [phone]        CARDIOTHORACIC SURGERY PROGRESS NOTE   R4 Days Post-Op Procedure(s) (LRB): CORONARY ARTERY BYPASS GRAFTING (CABG) using endoscopic greater saphenous vein harvest: SVC to Diag1; SVC to OM1; SVC to PDA and LIMA to LAD. (N/A) Clipping Of Atrial Appendage using 45 MM AtriCure AtriClip (N/A)  Subjective: Much more alert this morning.  Actually speaks and follows some simple commands.  Moves all 4 extremities.  Oriented only to name.  Denies pain, SOB  Objective: Vital signs: BP Readings from Last 1 Encounters:  02/28/19 (!) 102/59   Pulse Readings from Last 1 Encounters:  02/28/19 (!) 49   Resp Readings from Last 1 Encounters:  02/28/19 16   Temp Readings from Last 1 Encounters:  02/28/19 97.6 F (36.4 C) (Axillary)    Hemodynamics:    Physical Exam:  Rhythm:   Afib most of the night but just recently converted back to NSR w/ frequent PAC's and PVC's  Breath sounds: clear  Heart sounds:  RRR  Incisions:  Clean and dry  Abdomen:  Soft, non-distended  Extremities:  Warm, well-perfused    Intake/Output from previous day: 01/02 0701 - 01/03 0700 In: 2319 [I.V.:924.6; Blood:1394.3] Out: 2000  Intake/Output this shift: No intake/output data recorded.  Lab Results:  CBC: Recent Labs    02/27/19 0437 02/28/19 0250  WBC 6.5 8.0  HGB 7.1* 9.5*  HCT 22.1* 28.4*  PLT 76* 84*    BMET:  Recent Labs    02/27/19 0437 02/28/19 0250  NA 142 139  K 4.5 4.2  CL 107 103  CO2 20* 19*  GLUCOSE 122* 156*  BUN 32* 24*  CREATININE 5.55* 4.78*  CALCIUM 8.4* 8.4*     PT/INR:  No results for input(s): LABPROT, INR in the last 72 hours.  CBG (last 3)  Recent Labs    02/27/19 2342 02/28/19 0154 02/28/19 0441  GLUCAP 141* 160* 147*    ABG    Component Value Date/Time   PHART 7.323 (L) 02/25/2019 1150   PCO2ART 32.3 02/25/2019 1150   PO2ART 61.0 (L) 02/25/2019 1150   HCO3 16.8 (L) 02/25/2019 1150   TCO2 18 (L) 02/25/2019 1150   ACIDBASEDEF 9.0 (H) 02/25/2019 1150   O2SAT 90.0 02/25/2019 1150    CXR: LLL opacity c/w atelectasis and possible effusion, otherwise looks good  Assessment/Plan: S/P Procedure(s) (LRB): CORONARY ARTERY BYPASS GRAFTING (CABG) using endoscopic greater saphenous vein harvest: SVC to Diag1; SVC to OM1; SVC to PDA and LIMA to LAD. (N/A) Clipping Of Atrial Appendage using 45 MM AtriCure AtriClip (N/A)  Doing better now POD4 Encephalopathy improving, no focal signs on exam Post-op Afib, currently back in NSR w/ stable BP Breathing comfortably w/ O2 sats 97% ESRD on HD, tolerated HD again yesterday another 2 L out, weight unchanged and still reportedly 7 kg > preop Expected post op acute blood loss anemia, Hgb up 9.5 after transfusion Post op thrombocytopenia, platelet count up slightly 84k   Mobilize as much as possible  Continue IV amiodarone, cardizem as needed for rate control  Keep NPO for now but consider swallowing assessment if mental status continues to improve  Hold off on feeding tube placement for now  HD plans per Nephrology team   Rexene Alberts, MD 02/28/2019 7:33 AM

## 2019-02-28 NOTE — Progress Notes (Signed)
Cardiology Progress Note  Patient ID: Robert [name]. MRN: [id number] DOB: 1935-06-07 Date of Encounter: 02/28/2019  Primary Cardiologist: Shirlee More, MD  Subjective  Awake, alert. Follows commands but does not answer questions appropriately. Back in NSR.   ROS:  All other ROS reviewed and negative. Pertinent positives noted in the HPI.     Inpatient Medications  Scheduled Meds: . aspirin EC  325 mg Oral Daily   Or  . aspirin  324 mg Per Tube Daily  . Chlorhexidine Gluconate Cloth  6 each Topical Q0600  . Chlorhexidine Gluconate Cloth  6 each Topical Q0600  . Chlorhexidine Gluconate Cloth  6 each Topical Q0600  . [START ON 03/01/2019] enoxaparin (LOVENOX) injection  30 mg Subcutaneous Q48H  . insulin aspart  0-24 Units Subcutaneous Q4H  . levothyroxine  50 mcg Oral Q0600  . mouth rinse  15 mL Mouth Rinse BID  . mupirocin ointment  1 application Nasal BID  . pantoprazole (PROTONIX) IV  40 mg Intravenous Daily  . sodium chloride flush  10-40 mL Intracatheter Q12H  . sodium chloride flush  3 mL Intravenous Q12H   Continuous Infusions: . sodium chloride    . sodium chloride Stopped (02/27/19 1901)  . amiodarone 30 mg/hr (02/28/19 0700)  . insulin Stopped (02/25/19 1153)  . magnesium sulfate     PRN Meds: budesonide (PULMICORT) nebulizer solution, dextrose, metoprolol tartrate, morphine injection, ondansetron (ZOFRAN) IV, sodium chloride flush, sodium chloride flush   Vital Signs   Vitals:   02/28/19 0630 02/28/19 0645 02/28/19 0700 02/28/19 0732  BP: (!) 143/50 120/63 (!) 102/59   Pulse: (!) 25 (!) 55 (!) 49   Resp: 20 16 16    Temp:    (!) 97 F (36.1 C)  TempSrc:    Axillary  SpO2: 98% 97% 97%   Weight:      Height:        Intake/Output Summary (Last 24 hours) at 02/28/2019 0901 Last data filed at 02/28/2019 0700 Gross per 24 hour  Intake 2318.95 ml  Output 2000 ml  Net 318.95 ml   Last 3 Weights 02/28/2019 02/27/2019 02/27/2019  Weight (lbs) 177 lb 4 oz 174 lb  2.6 oz 176 lb 12.9 oz  Weight (kg) 80.4 kg 79 kg 80.2 kg      Telemetry  Overnight telemetry shows paroxysmal Afib with RVR, in sinus this AM, which I personally reviewed.   Physical Exam   Vitals:   02/28/19 0630 02/28/19 0645 02/28/19 0700 02/28/19 0732  BP: (!) 143/50 120/63 (!) 102/59   Pulse: (!) 25 (!) 55 (!) 49   Resp: 20 16 16    Temp:    (!) 97 F (36.1 C)  TempSrc:    Axillary  SpO2: 98% 97% 97%   Weight:      Height:         Intake/Output Summary (Last 24 hours) at 02/28/2019 0901 Last data filed at 02/28/2019 0700 Gross per 24 hour  Intake 2318.95 ml  Output 2000 ml  Net 318.95 ml    Last 3 Weights 02/28/2019 02/27/2019 02/27/2019  Weight (lbs) 177 lb 4 oz 174 lb 2.6 oz 176 lb 12.9 oz  Weight (kg) 80.4 kg 79 kg 80.2 kg    Body mass index is 26.95 kg/m.  General: ill-appearing  Head: Atraumatic, normal size  Eyes: PEERLA, EOMI  Neck: Supple, no JVD Endocrine: No thryomegaly Cardiac: irregular rhythm, 2/6 SEM Lungs: diminished breath sounds bilaterally   Abd: Soft, nontender, no hepatomegaly  Ext: No edema, pulses 2+ Musculoskeletal: No deformities, BUE and BLE strength normal and equal Skin: Warm and dry, no rashes   Neuro: alert, awake, does not follow commands, spontaneous movement of all extremities    Labs  High Sensitivity Troponin:   Recent Labs  Lab 02/02/2019 1736 02/19/2019 1922 02/03/2019 2200 02/10/2019 0124 01/30/2019 0955  TROPONINIHS 81* 856* 4,043* 4,270* 4,018*     Cardiac EnzymesNo results for input(s): TROPONINI in the last 168 hours. No results for input(s): TROPIPOC in the last 168 hours.  Chemistry Recent Labs  Lab 02/04/2019 1736 02/04/2019 0545 02/01/2019 0815 02/26/19 0420 02/27/19 0437 02/28/19 0250  NA 142 140  --  146* 142 139  K 3.8 4.5  --  4.7 4.5 4.2  CL 103 102  --  113* 107 103  CO2 25 24   < > 21* 20* 19*  GLUCOSE 233* 95  --  110* 122* 156*  BUN 34* 16  --  32* 32* 24*  CREATININE 6.23* 3.61*  --  5.43* 5.55* 4.78*  CALCIUM  9.5 8.7*   < > 8.4* 8.4* 8.4*  PROT 6.5  --   --   --   --  4.8*  ALBUMIN 3.5 3.3*  --   --   --  2.3*  AST 18  --   --   --   --  28  ALT 19  --   --   --   --  21  ALKPHOS 87  --   --   --   --  67  BILITOT 0.4  --   --   --   --  1.7*  GFRNONAA 8* 15*   < > 9* 9* 11*  GFRAA 9* 17*   < > 11* 10* 12*  ANIONGAP 14 14   < > 12 15 17*   < > = values in this interval not displayed.    Hematology Recent Labs  Lab 02/26/19 0420 02/27/19 0437 02/28/19 0250  WBC 6.3 6.5 8.0  RBC 2.55* 2.28* 3.13*  HGB 7.8* 7.1* 9.5*  HCT 24.5* 22.1* 28.4*  MCV 96.1 96.9 90.7  MCH 30.6 31.1 30.4  MCHC 31.8 32.1 33.5  RDW 17.0* 16.9* 18.4*  PLT 77* 76* 84*   BNPNo results for input(s): BNP, PROBNP in the last 168 hours.  DDimer No results for input(s): DDIMER in the last 168 hours.   Radiology  DG Chest Port 1 View  Result Date: 02/28/2019 CLINICAL DATA:  Congestive heart failure. EXAM: PORTABLE CHEST 1 VIEW COMPARISON:  February 26, 2019. FINDINGS: Stable cardiomegaly. Status post coronary bypass graft. Left internal jugular venous sheath is noted. Stable left basilar atelectasis or infiltrate is noted with associated pleural effusion. Left lung base is not included entirely on this exam. Visualized bony thorax is unremarkable. No pneumothorax is noted. IMPRESSION: Stable left basilar atelectasis or infiltrate is noted with associated pleural effusion. Left lung base is not included entirely on this exam. Electronically Signed   By: Marijo Conception M.D.   On: 02/28/2019 08:21    Cardiac Studies  TTE 02/14/2019  1. Left ventricular ejection fraction, by visual estimation, is 60 to 65%. The left ventricle has normal function. There is no left ventricular hypertrophy.  2. Left ventricular diastolic parameters are consistent with Grade II diastolic dysfunction (pseudonormalization).  3. The left ventricle has no regional wall motion abnormalities.  4. Elevated LVEDP.  5. Global right ventricle has normal  systolic function.The right  ventricular size is normal. No increase in right ventricular wall thickness.  6. Left atrial size was severely dilated.  7. Right atrial size was moderately dilated.  8. The mitral valve is normal in structure. Mild mitral valve regurgitation. No evidence of mitral stenosis.  9. The tricuspid valve is normal in structure. 10. The aortic valve is normal in structure. Aortic valve regurgitation is not visualized. Moderate aortic valve stenosis. 11. There is severe calcifcation of the aortic valve. 12. There is severe thickening of the aortic valve. 13. The pulmonic valve was normal in structure. Pulmonic valve regurgitation is trivial. 14. Normal pulmonary artery systolic pressure. 15. The inferior vena cava is dilated in size with <50% respiratory variability, suggesting right atrial pressure of 15 mmHg.  Patient Profile  Robert [name]. is a 84 y.o. male with ESRD, HTN, CAD s/p PCI, Stroke s/p CEA, DM admitted 12/29 for new-onset Afib. Subsequently had NSTEMI and found to have 3vCAD. S/p CABG x 4 02/22/2019.   Assessment & Plan   1. NSTEMI: s/p CABG x 4 02/13/2019 -LIMA-LAD, SVG-OM1, PDA, D1 -doing well and postop management per CTS -continue ASA/statin   2. Atrial fibrillation -new onset 12/29 -still having paroxysmal Afib episodes -continue amiodarone drip for now -stop diltiazem as back in NSR -will plan to transition to oral load once mentation improves -will need DOAC but will hold given thrombocytopenia; would start when ok by CT surgery   3. Moderate AS -follow as outpatient    For questions or updates, please contact [place] Please consult [email or web address] for contact info under   Signed, [place] Bells T. Audie Box, [place]  02/28/2019 9:01 AM

## 2019-03-01 ENCOUNTER — Inpatient Hospital Stay (HOSPITAL_COMMUNITY): Payer: Medicare Other

## 2019-03-01 LAB — COMPREHENSIVE METABOLIC PANEL
ALT: 21 U/L (ref 0–44)
AST: 23 U/L (ref 15–41)
Albumin: 2.2 g/dL — ABNORMAL LOW (ref 3.5–5.0)
Alkaline Phosphatase: 68 U/L (ref 38–126)
Anion gap: 20 — ABNORMAL HIGH (ref 5–15)
BUN: 42 mg/dL — ABNORMAL HIGH (ref 8–23)
CO2: 19 mmol/L — ABNORMAL LOW (ref 22–32)
Calcium: 8.7 mg/dL — ABNORMAL LOW (ref 8.9–10.3)
Chloride: 102 mmol/L (ref 98–111)
Creatinine, Ser: 6.26 mg/dL — ABNORMAL HIGH (ref 0.61–1.24)
GFR calc Af Amer: 9 mL/min — ABNORMAL LOW (ref >60)
GFR calc non Af Amer: 8 mL/min — ABNORMAL LOW (ref >60)
Glucose, Bld: 132 mg/dL — ABNORMAL HIGH (ref 70–99)
Potassium: 4.2 mmol/L (ref 3.5–5.1)
Sodium: 141 mmol/L (ref 135–145)
Total Bilirubin: 1.3 mg/dL — ABNORMAL HIGH (ref 0.3–1.2)
Total Protein: 5.1 g/dL — ABNORMAL LOW (ref 6.5–8.1)

## 2019-03-01 LAB — GLUCOSE, CAPILLARY
Glucose-Capillary: 118 mg/dL — ABNORMAL HIGH (ref 70–99)
Glucose-Capillary: 125 mg/dL — ABNORMAL HIGH (ref 70–99)
Glucose-Capillary: 94 mg/dL (ref 70–99)
Glucose-Capillary: 109 mg/dL — ABNORMAL HIGH (ref 70–99)
Glucose-Capillary: 144 mg/dL — ABNORMAL HIGH (ref 70–99)

## 2019-03-01 LAB — CBC
HCT: 29.8 % — ABNORMAL LOW (ref 39.0–52.0)
Hemoglobin: 10.1 g/dL — ABNORMAL LOW (ref 13.0–17.0)
MCH: 30.6 pg (ref 26.0–34.0)
MCHC: 33.9 g/dL (ref 30.0–36.0)
MCV: 90.3 fL (ref 80.0–100.0)
Platelets: 82 10*3/uL — ABNORMAL LOW (ref 150–400)
RBC: 3.3 MIL/uL — ABNORMAL LOW (ref 4.22–5.81)
RDW: 18 % — ABNORMAL HIGH (ref 11.5–15.5)
WBC: 8.7 10*3/uL (ref 4.0–10.5)
nRBC: 0 % (ref 0.0–0.2)

## 2019-03-01 MED ORDER — CHLORHEXIDINE GLUCONATE CLOTH 2 % EX PADS
6.0000 | MEDICATED_PAD | Freq: Every day | CUTANEOUS | Status: DC
  Administered 2019-03-01 – 2019-03-05 (×5): 6 via TOPICAL

## 2019-03-01 MED ORDER — ROSUVASTATIN CALCIUM 20 MG PO TABS
40.0000 mg | ORAL_TABLET | Freq: Every day | ORAL | Status: DC
  Administered 2019-03-02 – 2019-03-09 (×8): 40 mg via ORAL
  Filled 2019-03-01 (×9): qty 2

## 2019-03-01 NOTE — Progress Notes (Signed)
Progress Note  Patient Name: Robert [name]. Date of Encounter: 03/01/2019  Primary Cardiologist: Shirlee More, MD   Subjective   Confused; denies CP or dyspnea  Inpatient Medications    Scheduled Meds:  aspirin EC  325 mg Oral Daily   Or   aspirin  324 mg Per Tube Daily   Chlorhexidine Gluconate Cloth  6 each Topical Q0600   Chlorhexidine Gluconate Cloth  6 each Topical Q0600   Chlorhexidine Gluconate Cloth  6 each Topical Q0600   enoxaparin (LOVENOX) injection  30 mg Subcutaneous Q48H   insulin aspart  0-24 Units Subcutaneous Q4H   levothyroxine  50 mcg Oral Q0600   mouth rinse  15 mL Mouth Rinse BID   mupirocin ointment  1 application Nasal BID   pantoprazole (PROTONIX) IV  40 mg Intravenous Daily   sodium chloride flush  10-40 mL Intracatheter Q12H   sodium chloride flush  3 mL Intravenous Q12H   Continuous Infusions:  sodium chloride     sodium chloride Stopped (02/27/19 1901)   amiodarone 30 mg/hr (03/01/19 0600)   diltiazem (CARDIZEM) infusion 5 mg/hr (03/01/19 0600)   insulin Stopped (02/25/19 1153)   magnesium sulfate     PRN Meds: budesonide (PULMICORT) nebulizer solution, dextrose, metoprolol tartrate, morphine injection, ondansetron (ZOFRAN) IV, sodium chloride flush, sodium chloride flush   Vital Signs    Vitals:   03/01/19 0600 03/01/19 0630 03/01/19 0700 03/01/19 0734  BP: 130/84 (!) 136/105 129/72   Pulse: 85 (!) 109 (!) 51   Resp: (!) 22 (!) 21 15   Temp:    98.1 F (36.7 C)  TempSrc:    Oral  SpO2: 98% 94% 100%   Weight:      Height:        Intake/Output Summary (Last 24 hours) at 03/01/2019 0742 Last data filed at 03/01/2019 0600 Gross per 24 hour  Intake 506.82 ml  Output 100 ml  Net 406.82 ml   Last 3 Weights 03/01/2019 02/28/2019 02/27/2019  Weight (lbs) 174 lb 6.1 oz 177 lb 4 oz 174 lb 2.6 oz  Weight (kg) 79.1 kg 80.4 kg 79 kg      Telemetry    PAF; presently in sinus - Personally Reviewed  Physical Exam    GEN: No acute distress.   Neck: No JVD Cardiac: RRR, 2/6 systolic murmur Respiratory: Diminished BS bases GI: Soft, nontender, non-distended  MS: No edema Neuro: Grossly intact  Labs    High Sensitivity Troponin:   Recent Labs  Lab 01/26/2019 1736 02/02/2019 1922 02/20/2019 2200 02/08/2019 0124 02/19/2019 0955  TROPONINIHS 81* 856* 4,043* 4,270* 4,018*      Chemistry Recent Labs  Lab 02/02/2019 1736 02/07/2019 0545 02/24/19 0815 02/27/19 0437 02/28/19 0250 03/01/19 0526  NA 142 140  --  142 139 141  K 3.8 4.5  --  4.5 4.2 4.2  CL 103 102  --  107 103 102  CO2 25 24   < > 20* 19* 19*  GLUCOSE 233* 95  --  122* 156* 132*  BUN 34* 16  --  32* 24* 42*  CREATININE 6.23* 3.61*  --  5.55* 4.78* 6.26*  CALCIUM 9.5 8.7*   < > 8.4* 8.4* 8.7*  PROT 6.5  --   --   --  4.8* 5.1*  ALBUMIN 3.5 3.3*  --   --  2.3* 2.2*  AST 18  --   --   --  28 23  ALT 19  --   --   --  21 21  ALKPHOS 87  --   --   --  67 68  BILITOT 0.4  --   --   --  1.7* 1.3*  GFRNONAA 8* 15*   < > 9* 11* 8*  GFRAA 9* 17*   < > 10* 12* 9*  ANIONGAP 14 14   < > 15 17* 20*   < > = values in this interval not displayed.     Hematology Recent Labs  Lab 02/27/19 0437 02/28/19 0250 03/01/19 0526  WBC 6.5 8.0 8.7  RBC 2.28* 3.13* 3.30*  HGB 7.1* 9.5* 10.1*  HCT 22.1* 28.4* 29.8*  MCV 96.9 90.7 90.3  MCH 31.1 30.4 30.6  MCHC 32.1 33.5 33.9  RDW 16.9* 18.4* 18.0*  PLT 76* 84* 82*    BNPNo results for input(s): BNP, PROBNP in the last 168 hours.   DDimer No results for input(s): DDIMER in the last 168 hours.   Radiology    DG Chest Port 1 View  Result Date: 03/01/2019 CLINICAL DATA:  Left pleural effusion EXAM: PORTABLE CHEST 1 VIEW COMPARISON:  Yesterday FINDINGS: Unchanged hazy left pulmonary opacity, attributed to postoperative pleural fluid and atelectasis. CABG. Left IJ sheath with tip just left of midline, unchanged. No visible pneumothorax. IMPRESSION: Stable left-sided pleural effusion and  atelectasis. Electronically Signed   By: Monte Fantasia M.D.   On: 03/01/2019 06:33   DG Chest Port 1 View  Result Date: 02/28/2019 CLINICAL DATA:  Congestive heart failure. EXAM: PORTABLE CHEST 1 VIEW COMPARISON:  February 26, 2019. FINDINGS: Stable cardiomegaly. Status post coronary bypass graft. Left internal jugular venous sheath is noted. Stable left basilar atelectasis or infiltrate is noted with associated pleural effusion. Left lung base is not included entirely on this exam. Visualized bony thorax is unremarkable. No pneumothorax is noted. IMPRESSION: Stable left basilar atelectasis or infiltrate is noted with associated pleural effusion. Left lung base is not included entirely on this exam. Electronically Signed   By: Marijo Conception M.D.   On: 02/28/2019 08:21    Patient Profile     Robert [name]. is a 84 y.o. male with ESRD, HTN, CAD s/p PCI, Stroke s/p CEA, DM admitted 12/29 for new-onset Afib. Subsequently had NSTEMI and found to have 3vCAD. S/p CABG x 4 02/06/2019.  Note preoperative echocardiogram showed normal LV function, mild mitral regurgitation, moderate aortic stenosis.  Assessment & Plan    1 paroxysmal atrial fibrillation-patient continues to have episodes of atrial fibrillation but is in sinus rhythm this morning.  Continue IV amiodarone.  Will transition to oral in the next 24 to 48 hours.  Began Coumadin when okay with surgery.  2 NSTEMI/coronary artery disease status post coronary artery bypass and graft-continue ASA; add Crestor 40 mg daily.  3 moderate aortic stenosis-follow-up as an outpatient.  4 end-stage renal disease-dialysis per nephrology.  5 history of stroke status post carotid endarterectomy-medical therapy with aspirin and statin.  For questions or updates, please contact [place] Please consult [email or web address] for contact info under        Signed, Kirk Ruths, MD  03/01/2019, 7:42 AM

## 2019-03-01 NOTE — Progress Notes (Signed)
[address]       [place] [address]             [phone]                 5 Days Post-Op Procedure(s) (LRB): CORONARY ARTERY BYPASS GRAFTING (CABG) using endoscopic greater saphenous vein harvest: SVC to Diag1; SVC to OM1; SVC to PDA and LIMA to LAD. (N/A) Clipping Of Atrial Appendage using 45 MM AtriCure AtriClip (N/A)   Events: Mental status improved yesterday Remains in afib On amio and cardene gtt _______________________________________________________________ Vitals: BP 129/72   Pulse (!) 51   Temp 97.8 F (36.6 C) (Axillary)   Resp 15   Ht 5\' 8"  (1.727 m)   Wt 79.1 kg   SpO2 100%   BMI 26.51 kg/m   - Neuro: alert, NAD  - Cardiovascular: afib  Drips: amio, and cardene.      - Pulm: EWOB on Lapel    ABG    Component Value Date/Time   PHART 7.323 (L) 02/25/2019 1150   PCO2ART 32.3 02/25/2019 1150   PO2ART 61.0 (L) 02/25/2019 1150   HCO3 16.8 (L) 02/25/2019 1150   TCO2 18 (L) 02/25/2019 1150   ACIDBASEDEF 9.0 (H) 02/25/2019 1150   O2SAT 90.0 02/25/2019 1150    - Abd: soft - Extremity: warm  .Intake/Output      01/03 0701 - 01/04 0700 01/04 0701 - 01/05 0700   I.V. (mL/kg) 506.8 (6.4)    Blood     Total Intake(mL/kg) 506.8 (6.4)    Urine (mL/kg/hr) 100 (0.1)    Other     Total Output 100    Net +406.8            _______________________________________________________________ Labs: CBC Latest Ref Rng & Units 03/01/2019 02/28/2019 02/27/2019  WBC 4.0 - 10.5 K/uL 8.7 8.0 6.5  Hemoglobin 13.0 - 17.0 g/dL 10.1(L) 9.5(L) 7.1(L)  Hematocrit 39.0 - 52.0 % 29.8(L) 28.4(L) 22.1(L)  Platelets 150 - 400 K/uL 82(L) 84(L) 76(L)   CMP Latest Ref Rng & Units 03/01/2019 02/28/2019 02/27/2019  Glucose 70 - 99 mg/dL 132(H) 156(H) 122(H)  BUN 8 - 23 mg/dL 42(H) 24(H) 32(H)  Creatinine 0.61 - 1.24 mg/dL 6.26(H) 4.78(H) 5.55(H)  Sodium 135 - 145 mmol/L 141 139 142  Potassium 3.5 - 5.1 mmol/L 4.2 4.2 4.5  Chloride 98 - 111 mmol/L 102 103 107    CO2 22 - 32 mmol/L 19(L) 19(L) 20(L)  Calcium 8.9 - 10.3 mg/dL 8.7(L) 8.4(L) 8.4(L)  Total Protein 6.5 - 8.1 g/dL 5.1(L) 4.8(L) -  Total Bilirubin 0.3 - 1.2 mg/dL 1.3(H) 1.7(H) -  Alkaline Phos 38 - 126 U/L 68 67 -  AST 15 - 41 U/L 23 28 -  ALT 0 - 44 U/L 21 21 -    CXR: Left effusion  _______________________________________________________________  Assessment and Plan: POD 5 s/p CABG Atriclip.  Altered mental status improving after dialysis  Neuro: non focal neural exam.  Slowly improving after dialysis.  Scheduled for HD today.  Will continue to assess CV: afib, rate improved with cardene gtt.  atriclip placed, so will hold off on anticoagulation for now Pulm: left effusion.  Will continue to follow after dialysis.  Will consider thoracentesis Renal: dialysis today GI: npo for now due to mental status.  If not improved, will place feeding tube this afternoon Heme: stable ID: afebrile, no leukocytosis Endo: SSI Dispo: continue ICU care  Melodie Bouillon, MD 03/01/2019 7:28 AM

## 2019-03-01 NOTE — Progress Notes (Signed)
[facility] NEPHROLOGY PROGRESS NOTE  Assessment/ Plan: Pt is a 85 y.o. yo male with HTN, CAD, stroke, DM, admitted with an NSTEMI and found to have three-vessel CAD status post CABG on 12/30.  Dialysis: [facility] 4h   400/800    73.5kg    3/2.25Ca bath  P4  Hep none L AVF Hectorol 4 Venofer 50 Mircera 75 (last 12/15)  #CAD/NSTEMI: LHC with severe multivessel CAD status post CABG.  Cardiology is following.  # ESRD: Had HD on Friday and Saturday.  Plan for next HD tomorrow.  # Anemia due to CKD: Received blood transfusion postop.  Continue ESA.  Monitor lab.  # Secondary hyperparathyroidism: Continue vitamin D and binders.  # HTN/volume: Blood pressure acceptable.  #Paroxysmal A. fib: On IV amiodarone and plan for restarting [place] noted.  Subjective: Seen and examined at bedside.  No new event. Objective Vital signs in last 24 hours: Vitals:   03/01/19 0600 03/01/19 0630 03/01/19 0700 03/01/19 0734  BP: 130/84 (!) 136/105 129/72   Pulse: 85 (!) 109 (!) 51   Resp: (!) 22 (!) 21 15   Temp:    98.1 F (36.7 C)  TempSrc:    Oral  SpO2: 98% 94% 100%   Weight:      Height:       Weight change: -1.1 kg  Intake/Output Summary (Last 24 hours) at 03/01/2019 0836 Last data filed at 03/01/2019 0800 Gross per 24 hour  Intake 553.62 ml  Output 100 ml  Net 453.62 ml       Labs: Basic Metabolic Panel: Recent Labs  Lab 02/08/2019 0545 02/13/2019 0815 02/27/19 0437 02/28/19 0250 03/01/19 0526  NA 140  --  142 139 141  K 4.5  --  4.5 4.2 4.2  CL 102  --  107 103 102  CO2 24   < > 20* 19* 19*  GLUCOSE 95  --  122* 156* 132*  BUN 16  --  32* 24* 42*  CREATININE 3.61*  --  5.55* 4.78* 6.26*  CALCIUM 8.7*   < > 8.4* 8.4* 8.7*  PHOS 2.9  --   --   --   --    < > = values in this interval not displayed.   Liver Function Tests: Recent Labs  Lab 01/26/2019 1736 02/25/2019 0545 02/28/19 0250 03/01/19 0526  AST 18  --  28 23  ALT 19  --  21 21  ALKPHOS 87  --   67 68  BILITOT 0.4  --  1.7* 1.3*  PROT 6.5  --  4.8* 5.1*  ALBUMIN 3.5 3.3* 2.3* 2.2*   No results for input(s): LIPASE, AMYLASE in the last 168 hours. No results for input(s): AMMONIA in the last 168 hours. CBC: Recent Labs  Lab 02/20/2019 1736 01/29/2019 0545 02/21/2019 0815 02/25/19 1415 02/26/19 0420 02/27/19 0437 02/28/19 0250 03/01/19 0526  WBC 5.5 6.2   < > 6.2 6.3 6.5 8.0 8.7  NEUTROABS 3.8 4.3  --   --   --   --   --   --   HGB 12.2* 11.6*  --  8.2* 7.8* 7.1* 9.5* 10.1*  HCT 38.9* 36.0*  --  24.6* 24.5* 22.1* 28.4* 29.8*  MCV 99.0 96.5   < > 93.5 96.1 96.9 90.7 90.3  PLT PLATELET CLUMPS NOTED ON SMEAR, COUNT APPEARS DECREASED 118*   < > 84* 77* 76* 84* 82*   < > = values in this interval not displayed.  Cardiac Enzymes: No results for input(s): CKTOTAL, CKMB, CKMBINDEX, TROPONINI in the last 168 hours. CBG: Recent Labs  Lab 02/28/19 1659 02/28/19 2050 02/28/19 2353 03/01/19 0425 03/01/19 0731  GLUCAP 153* 137* 116* 118* 125*    Iron Studies: No results for input(s): IRON, TIBC, TRANSFERRIN, FERRITIN in the last 72 hours. Studies/Results: DG Chest Port 1 View  Result Date: 03/01/2019 CLINICAL DATA:  Left pleural effusion EXAM: PORTABLE CHEST 1 VIEW COMPARISON:  Yesterday FINDINGS: Unchanged hazy left pulmonary opacity, attributed to postoperative pleural fluid and atelectasis. CABG. Left IJ sheath with tip just left of midline, unchanged. No visible pneumothorax. IMPRESSION: Stable left-sided pleural effusion and atelectasis. Electronically Signed   By: Monte Fantasia M.D.   On: 03/01/2019 06:33   DG Chest Port 1 View  Result Date: 02/28/2019 CLINICAL DATA:  Congestive heart failure. EXAM: PORTABLE CHEST 1 VIEW COMPARISON:  February 26, 2019. FINDINGS: Stable cardiomegaly. Status post coronary bypass graft. Left internal jugular venous sheath is noted. Stable left basilar atelectasis or infiltrate is noted with associated pleural effusion. Left lung base is not  included entirely on this exam. Visualized bony thorax is unremarkable. No pneumothorax is noted. IMPRESSION: Stable left basilar atelectasis or infiltrate is noted with associated pleural effusion. Left lung base is not included entirely on this exam. Electronically Signed   By: Marijo Conception M.D.   On: 02/28/2019 08:21    Medications: Infusions: . sodium chloride    . sodium chloride Stopped (02/27/19 1901)  . amiodarone 30 mg/hr (03/01/19 0800)  . diltiazem (CARDIZEM) infusion 7.5 mg/hr (03/01/19 0800)  . insulin Stopped (02/25/19 1153)  . magnesium sulfate      Scheduled Medications: . aspirin EC  325 mg Oral Daily   Or  . aspirin  324 mg Per Tube Daily  . Chlorhexidine Gluconate Cloth  6 each Topical Q0600  . Chlorhexidine Gluconate Cloth  6 each Topical Q0600  . Chlorhexidine Gluconate Cloth  6 each Topical Q0600  . enoxaparin (LOVENOX) injection  30 mg Subcutaneous Q48H  . insulin aspart  0-24 Units Subcutaneous Q4H  . levothyroxine  50 mcg Oral Q0600  . mouth rinse  15 mL Mouth Rinse BID  . mupirocin ointment  1 application Nasal BID  . pantoprazole (PROTONIX) IV  40 mg Intravenous Daily  . rosuvastatin  40 mg Oral q1800  . sodium chloride flush  10-40 mL Intracatheter Q12H  . sodium chloride flush  3 mL Intravenous Q12H    have reviewed scheduled and prn medications.  Physical Exam: General:NAD, comfortable, alert awake Heart:RRR, s1s2 nl Lungs:clear b/l, no crackle Abdomen:soft, Non-tender, non-distended Extremities: Trace LE edema Dialysis Access: Left upper extremity AV fistula has bruit and thrill.  Lukka Black Prasad Adonis Ryther 03/01/2019,8:36 AM  LOS: 6 days  Pager: [phone]

## 2019-03-01 NOTE — Progress Notes (Signed)
  Speech Language Pathology Treatment: Dysphagia  Patient Details Name: Robert [name]. MRN: [id number] DOB: 10/29/1935 Today's Date: 03/01/2019 Time: 4008-6761 SLP Time Calculation (min) (ACUTE ONLY): 24 min  Assessment / Plan / Recommendation Clinical Impression  Improved alertness and cooperation today- needed extra time with explanation and he was agreeable to mouth care and trials ice chips, then tsp sips water. Removed majority of adhered mucous but some on his left side hard palate remains. Suspect delayed swallow with trials and although no overt s/s aspiration with pt's deconditioning, laryngeal intrusion likely. I will recommend continued oral care followed by supervised ice chips- needed to assist in moistening mucosa and assisting to remove dried secretions. Pt has history of esophageal dysphagia. Although improving he is not yet ready for instrumental swallow assessment to ensure safe intake today. Will continue to follow and recommend objective testing when appropriate. Please provide ice chips via nursing (do not leave pt), after oral care and sitting upright.    HPI HPI: Robert [name] has PMH: stroke, 2018, GERD, esophageal dilation 03/18/2010, ESRD, DM, MI, CAD. The patient presented with generalized weakness and tachycardia. Underwent CABG x 4 12/30, intubawted 12/30-12/31. CXR significant for cardiomegaly with no acute changes. New onset A-fib. MBS 2018 following stroke recommending puree, thin (vocal cord penetration).        SLP Plan  Continue with current plan of care       Recommendations  Diet recommendations: Other(comment)(ice chips after oral care) Medication Administration: Via alternative means                Oral Care Recommendations: Oral care QID Follow up Recommendations: Skilled Nursing facility;24 hour supervision/assistance SLP Visit Diagnosis: Dysphagia, unspecified (R13.10) Plan: Continue with current plan of care       GO                 Houston Siren 03/01/2019, 11:28 AM

## 2019-03-02 LAB — GLUCOSE, CAPILLARY
Glucose-Capillary: 115 mg/dL — ABNORMAL HIGH (ref 70–99)
Glucose-Capillary: 119 mg/dL — ABNORMAL HIGH (ref 70–99)
Glucose-Capillary: 133 mg/dL — ABNORMAL HIGH (ref 70–99)
Glucose-Capillary: 135 mg/dL — ABNORMAL HIGH (ref 70–99)
Glucose-Capillary: 64 mg/dL — ABNORMAL LOW (ref 70–99)
Glucose-Capillary: 93 mg/dL (ref 70–99)
Glucose-Capillary: 95 mg/dL (ref 70–99)

## 2019-03-02 LAB — BASIC METABOLIC PANEL
Anion gap: 20 — ABNORMAL HIGH (ref 5–15)
BUN: 55 mg/dL — ABNORMAL HIGH (ref 8–23)
CO2: 21 mmol/L — ABNORMAL LOW (ref 22–32)
Calcium: 8.8 mg/dL — ABNORMAL LOW (ref 8.9–10.3)
Chloride: 101 mmol/L (ref 98–111)
Creatinine, Ser: 7.17 mg/dL — ABNORMAL HIGH (ref 0.61–1.24)
GFR calc Af Amer: 8 mL/min — ABNORMAL LOW (ref >60)
GFR calc non Af Amer: 6 mL/min — ABNORMAL LOW (ref >60)
Glucose, Bld: 107 mg/dL — ABNORMAL HIGH (ref 70–99)
Potassium: 4 mmol/L (ref 3.5–5.1)
Sodium: 142 mmol/L (ref 135–145)

## 2019-03-02 LAB — CBC
HCT: 30 % — ABNORMAL LOW (ref 39.0–52.0)
Hemoglobin: 10 g/dL — ABNORMAL LOW (ref 13.0–17.0)
MCH: 30.3 pg (ref 26.0–34.0)
MCHC: 33.3 g/dL (ref 30.0–36.0)
MCV: 90.9 fL (ref 80.0–100.0)
Platelets: 100 10*3/uL — ABNORMAL LOW (ref 150–400)
RBC: 3.3 MIL/uL — ABNORMAL LOW (ref 4.22–5.81)
RDW: 18.6 % — ABNORMAL HIGH (ref 11.5–15.5)
WBC: 9.8 10*3/uL (ref 4.0–10.5)
nRBC: 0 % (ref 0.0–0.2)

## 2019-03-02 LAB — PHOSPHORUS: Phosphorus: 3.4 mg/dL (ref 2.5–4.6)

## 2019-03-02 LAB — MAGNESIUM: Magnesium: 1.9 mg/dL (ref 1.7–2.4)

## 2019-03-02 MED ORDER — LEVOTHYROXINE SODIUM 50 MCG PO TABS
50.0000 ug | ORAL_TABLET | Freq: Every day | ORAL | Status: DC
  Administered 2019-03-02 – 2019-03-09 (×8): 50 ug via ORAL
  Filled 2019-03-02 (×8): qty 1

## 2019-03-02 MED ORDER — PRO-STAT SUGAR FREE PO LIQD
30.0000 mL | Freq: Two times a day (BID) | ORAL | Status: DC
  Administered 2019-03-02 – 2019-03-04 (×5): 30 mL
  Filled 2019-03-02 (×5): qty 30

## 2019-03-02 MED ORDER — DARBEPOETIN ALFA 60 MCG/0.3ML IJ SOSY
60.0000 ug | PREFILLED_SYRINGE | INTRAMUSCULAR | Status: DC
  Administered 2019-03-02: 60 ug via INTRAVENOUS
  Filled 2019-03-02 (×2): qty 0.3

## 2019-03-02 MED ORDER — ALBUTEROL SULFATE (2.5 MG/3ML) 0.083% IN NEBU
INHALATION_SOLUTION | RESPIRATORY_TRACT | Status: AC
  Administered 2019-03-02: 10:00:00 2.5 mg via RESPIRATORY_TRACT
  Filled 2019-03-02: qty 3

## 2019-03-02 MED ORDER — ALBUTEROL SULFATE (2.5 MG/3ML) 0.083% IN NEBU
2.5000 mg | INHALATION_SOLUTION | Freq: Four times a day (QID) | RESPIRATORY_TRACT | Status: DC | PRN
  Administered 2019-03-03 – 2019-03-09 (×13): 2.5 mg via RESPIRATORY_TRACT
  Filled 2019-03-02 (×15): qty 3

## 2019-03-02 MED ORDER — OSMOLITE 1.5 CAL PO LIQD
1000.0000 mL | ORAL | Status: DC
  Administered 2019-03-02 – 2019-03-03 (×2): 1000 mL
  Filled 2019-03-02 (×4): qty 1000

## 2019-03-02 MED ORDER — ASPIRIN EC 325 MG PO TBEC
325.0000 mg | DELAYED_RELEASE_TABLET | Freq: Every day | ORAL | Status: DC
  Administered 2019-03-04 – 2019-03-05 (×2): 325 mg via ORAL
  Filled 2019-03-02 (×2): qty 1

## 2019-03-02 MED ORDER — ASPIRIN 81 MG PO CHEW
324.0000 mg | CHEWABLE_TABLET | Freq: Every day | ORAL | Status: DC
  Administered 2019-03-02 – 2019-03-10 (×7): 324 mg
  Filled 2019-03-02 (×7): qty 4

## 2019-03-02 NOTE — Progress Notes (Signed)
Placed bilateral wrist restraint orders per Dr. Kipp Brood. Patient is with family member and restraints are not currently in use.

## 2019-03-02 NOTE — Progress Notes (Signed)
Tube feeding begun after successful placement of NG tube.

## 2019-03-02 NOTE — Progress Notes (Signed)
SLP Cancellation Note  Patient Details Name: Robert [name]. MRN: [id number] DOB: 1935/10/04   Cancelled treatment:        Pt had Cortrak placed earlier with 4 people to assist to keep him still. He let this SLP look in oral cavity- moderate amounts dried mucous (appears to be blood) on hard palate. Allowed one attempt at oral care but unsuccessful to remove. RN stated she attempted to give ice chip (as recommended) 30 min ago and he refused. Encouraged RN to continue oral care as able and ice chips to assist in breaking up residue.     Houston Siren 03/02/2019, 3:44 PM    Orbie Pyo Colvin Caroli.Ed Risk analyst [phone] Office [phone]

## 2019-03-02 NOTE — Progress Notes (Signed)
Progress Note  Patient Name: Robert [name]. Date of Encounter: 03/02/2019  Primary Cardiologist: Shirlee More, MD   Subjective   Very confused this AM  Inpatient Medications    Scheduled Meds: . aspirin EC  325 mg Oral Daily   Or  . aspirin  324 mg Per Tube Daily  . Chlorhexidine Gluconate Cloth  6 each Topical Q0600  . enoxaparin (LOVENOX) injection  30 mg Subcutaneous Q48H  . insulin aspart  0-24 Units Subcutaneous Q4H  . levothyroxine  50 mcg Oral Q0600  . mouth rinse  15 mL Mouth Rinse BID  . pantoprazole (PROTONIX) IV  40 mg Intravenous Daily  . rosuvastatin  40 mg Oral q1800  . sodium chloride flush  10-40 mL Intracatheter Q12H  . sodium chloride flush  3 mL Intravenous Q12H   Continuous Infusions: . sodium chloride    . sodium chloride Stopped (02/27/19 1901)  . amiodarone 30 mg/hr (03/02/19 0700)  . diltiazem (CARDIZEM) infusion 12.5 mg/hr (03/02/19 0700)   PRN Meds: budesonide (PULMICORT) nebulizer solution, dextrose, metoprolol tartrate, morphine injection, ondansetron (ZOFRAN) IV, sodium chloride flush, sodium chloride flush   Vital Signs    Vitals:   03/02/19 0400 03/02/19 0500 03/02/19 0600 03/02/19 0700  BP: (!) 125/97 (!) 105/46 (!) 114/99   Pulse: 65 (!) 28 (!) 111 (!) 134  Resp: (!) 27 (!) 26 19 (!) 25  Temp: 97.6 F (36.4 C)     TempSrc: Axillary     SpO2: 94% (!) 87% 99% 100%  Weight:  80.3 kg    Height:        Intake/Output Summary (Last 24 hours) at 03/02/2019 0816 Last data filed at 03/02/2019 0700 Gross per 24 hour  Intake 632.48 ml  Output --  Net 632.48 ml   Last 3 Weights 03/02/2019 03/01/2019 02/28/2019  Weight (lbs) 177 lb 0.5 oz 174 lb 6.1 oz 177 lb 4 oz  Weight (kg) 80.3 kg 79.1 kg 80.4 kg      Telemetry    PAF; back in atrial fibrillation - Personally Reviewed  Physical Exam   GEN: Confused; NAD Neck: supple Cardiac: irregular, 2/6 systolic murmur Respiratory: CTA anteriorly GI: Soft, NT/ND MS: No edema Neuro:  Moves all ext; confused.  Labs    High Sensitivity Troponin:   Recent Labs  Lab 02/25/2019 1736 02/12/2019 1922 02/12/2019 2200 02/03/2019 0124 02/14/2019 0955  TROPONINIHS 81* 856* 4,043* 4,270* 4,018*      Chemistry Recent Labs  Lab 01/29/2019 0545 02/16/2019 0815 02/28/19 0250 03/01/19 0526 03/02/19 0506  NA 140  --  139 141 142  K 4.5  --  4.2 4.2 4.0  CL 102  --  103 102 101  CO2 24   < > 19* 19* 21*  GLUCOSE 95  --  156* 132* 107*  BUN 16  --  24* 42* 55*  CREATININE 3.61*  --  4.78* 6.26* 7.17*  CALCIUM 8.7*   < > 8.4* 8.7* 8.8*  PROT  --   --  4.8* 5.1*  --   ALBUMIN 3.3*  --  2.3* 2.2*  --   AST  --   --  28 23  --   ALT  --   --  21 21  --   ALKPHOS  --   --  67 68  --   BILITOT  --   --  1.7* 1.3*  --   GFRNONAA 15*   < > 11* 8* 6*  GFRAA  17*   < > 12* 9* 8*  ANIONGAP 14   < > 17* 20* 20*   < > = values in this interval not displayed.     Hematology Recent Labs  Lab 02/28/19 0250 03/01/19 0526 03/02/19 0506  WBC 8.0 8.7 9.8  RBC 3.13* 3.30* 3.30*  HGB 9.5* 10.1* 10.0*  HCT 28.4* 29.8* 30.0*  MCV 90.7 90.3 90.9  MCH 30.4 30.6 30.3  MCHC 33.5 33.9 33.3  RDW 18.4* 18.0* 18.6*  PLT 84* 82* 100*    Radiology    DG Chest Port 1 View  Result Date: 03/01/2019 CLINICAL DATA:  Left pleural effusion EXAM: PORTABLE CHEST 1 VIEW COMPARISON:  Yesterday FINDINGS: Unchanged hazy left pulmonary opacity, attributed to postoperative pleural fluid and atelectasis. CABG. Left IJ sheath with tip just left of midline, unchanged. No visible pneumothorax. IMPRESSION: Stable left-sided pleural effusion and atelectasis. Electronically Signed   By: Monte Fantasia M.D.   On: 03/01/2019 06:33    Patient Profile     Robert [name]. is a 84 y.o. male with ESRD, HTN, CAD s/p PCI, Stroke s/p CEA, DM admitted 12/29 for new-onset Afib. Subsequently had NSTEMI and found to have 3vCAD. S/p CABG x 4 02/17/2019.  Note preoperative echocardiogram showed normal LV function, mild mitral  regurgitation, moderate aortic stenosis.  Assessment & Plan    1 paroxysmal atrial fibrillation-back in atrial fibrillation. Heart rate controlled. Continue IV amiodarone and cardizem.  Will transition to oral when patient less confused.  Began Coumadin when okay with surgery.  2 NSTEMI/coronary artery disease status post coronary artery bypass and graft-continue ASA and Crestor 40 mg daily.  3 moderate aortic stenosis-follow-up as an outpatient.  4 end-stage renal disease-dialysis per nephrology.  5 history of stroke status post carotid endarterectomy-medical therapy with aspirin and statin.  For questions or updates, please contact [place] Please consult [email or web address] for contact info under        Signed, Kirk Ruths, MD  03/02/2019, 8:16 AM

## 2019-03-02 NOTE — Progress Notes (Signed)
Initial Nutrition Assessment  DOCUMENTATION CODES:   Not applicable  INTERVENTION:   Tube Feeding:  Osmolite 1.5 at 50 ml/hr Begin TF at 20 ml/hr; titrate by 10 mL q 8 hours until goal rate of 50 ml/hr Pro-Stat 30 mL BID Provides 105 g of protein, 2000 kcals and 912 mL of free water  NUTRITION DIAGNOSIS:   Inadequate oral intake related to acute illness, lethargy/confusion, inability to eat as evidenced by NPO status.  GOAL:   Patient will meet greater than or equal to 90% of their needs  MONITOR:   TF tolerance, Diet advancement, Labs, Weight trends, Skin  REASON FOR ASSESSMENT:   Consult Enteral/tube feeding initiation and management  ASSESSMENT:   84 yo admitted with NSTEMI, found to have 3 vessel CAD and underwent CABG x 4. PMH includes ESRD on HD, HTN, CAD, stroke, DM  12/30 CABG x 4 01/05 Cortrak inserted  Pt alert, very confused this AM. Pt unable to answer questions regarding diet and weight history at this time  Plan for iHD today  NPO since 12/31 but appears pt on briefly on po diet since admission, no recorded po intake and unsure if pt ever received a meal  EDW 73.5 kg, Current wt 80.2 kg. Admit wt 73.7 kg.  Net +3 L  Phosphorus 2.9 (considered low for HD patient), potassium wdl  Labs: CBGs 64-144 Meds: ss novolog   Diet Order:   Diet Order            Diet NPO time specified  Diet effective now              EDUCATION NEEDS:   Not appropriate for education at this time  Skin:  Skin Assessment: Reviewed RN Assessment  Last BM:  1/1  Height:   Ht Readings from Last 1 Encounters:  02/07/2019 5\' 8"  (1.727 m)    Weight:   Wt Readings from Last 1 Encounters:  03/02/19 80.2 kg    Ideal Body Weight:     BMI:  Body mass index is 26.88 kg/m.  Estimated Nutritional Needs:   Kcal:  7262-0355 kcals  Protein:  92-110 g  Fluid:  1000 mL plus UOP   BorgWarner MS, RDN, LDN, CNSC [phone] Pager  [phone]  Weekend/On-Call Pager

## 2019-03-02 NOTE — Progress Notes (Signed)
Patient desaatted to the 70's when being moved, wheezing noted. RT called for nebulizer treatment.

## 2019-03-02 NOTE — Plan of Care (Signed)
  Problem: Clinical Measurements: Goal: Ability to maintain clinical measurements within normal limits will improve Outcome: Progressing Goal: Will remain free from infection Outcome: Progressing Goal: Diagnostic test results will improve Outcome: Progressing Goal: Respiratory complications will improve Outcome: Progressing Goal: Cardiovascular complication will be avoided Outcome: Progressing   Problem: Education: Goal: Knowledge of General Education information will improve Description: Including pain rating scale, medication(s)/side effects and non-pharmacologic comfort measures Outcome: Not Applicable Note: Altered mental status   Problem: Health Behavior/Discharge Planning: Goal: Ability to manage health-related needs will improve Outcome: Not Applicable

## 2019-03-02 NOTE — Procedures (Signed)
Cortrak  Person Inserting Tube:  [name], RD Tube Type:  Cortrak - 43 inches Tube Location:  Right nare Initial Placement:  Stomach Secured by: Bridle Technique Used to Measure Tube Placement:  Documented cm marking at nare/ corner of mouth Cortrak Secured At:  67 cm Procedure Comments:  Cortrak Tube Team Note:  Consult received to place a Cortrak feeding tube.   No x-ray is required. RN may begin using tube.    If the tube becomes dislodged please keep the tube and contact the Cortrak team at [email or web address] ([email or web address]) for replacement.  If after hours and replacement cannot be delayed, place a NG tube and confirm placement with an abdominal x-ray.     Robert Matin, MS, RD, LDN, [facility] Inpatient Clinical Dietitian Pager # [phone] After hours/weekend pager # [phone]

## 2019-03-02 NOTE — Progress Notes (Signed)
      [address]       [place] [address]             [phone]                 6 Days Post-Op Procedure(s) (LRB): CORONARY ARTERY BYPASS GRAFTING (CABG) using endoscopic greater saphenous vein harvest: SVC to Diag1; SVC to OM1; SVC to PDA and LIMA to LAD. (N/A) Clipping Of Atrial Appendage using 45 MM AtriCure AtriClip (N/A)   Events: Mental status continues to improve _______________________________________________________________ Vitals: BP (!) 114/99   Pulse (!) 134   Temp 97.6 F (36.4 C) (Axillary)   Resp (!) 25   Ht 5\' 8"  (1.727 m)   Wt 80.3 kg   SpO2 100%   BMI 26.92 kg/m   - Neuro: alert, NAD  - Cardiovascular: afib  Drips: amio, and cardene.      - Pulm: EWOB on     ABG    Component Value Date/Time   PHART 7.323 (L) 02/25/2019 1150   PCO2ART 32.3 02/25/2019 1150   PO2ART 61.0 (L) 02/25/2019 1150   HCO3 16.8 (L) 02/25/2019 1150   TCO2 18 (L) 02/25/2019 1150   ACIDBASEDEF 9.0 (H) 02/25/2019 1150   O2SAT 90.0 02/25/2019 1150    - Abd: soft - Extremity: warm  .Intake/Output      01/04 0701 - 01/05 0700 01/05 0701 - 01/06 0700   I.V. (mL/kg) 656.6 (8.2)    Total Intake(mL/kg) 656.6 (8.2)    Urine (mL/kg/hr) 10 (0)    Total Output 10    Net +646.6            _______________________________________________________________ Labs: CBC Latest Ref Rng & Units 03/02/2019 03/01/2019 02/28/2019  WBC 4.0 - 10.5 K/uL 9.8 8.7 8.0  Hemoglobin 13.0 - 17.0 g/dL 10.0(L) 10.1(L) 9.5(L)  Hematocrit 39.0 - 52.0 % 30.0(L) 29.8(L) 28.4(L)  Platelets 150 - 400 K/uL 100(L) 82(L) 84(L)   CMP Latest Ref Rng & Units 03/02/2019 03/01/2019 02/28/2019  Glucose 70 - 99 mg/dL 107(H) 132(H) 156(H)  BUN 8 - 23 mg/dL 55(H) 42(H) 24(H)  Creatinine 0.61 - 1.24 mg/dL 7.17(H) 6.26(H) 4.78(H)  Sodium 135 - 145 mmol/L 142 141 139  Potassium 3.5 - 5.1 mmol/L 4.0 4.2 4.2  Chloride 98 - 111 mmol/L 101 102 103  CO2 22 - 32 mmol/L 21(L) 19(L) 19(L)  Calcium 8.9 - 10.3  mg/dL 8.8(L) 8.7(L) 8.4(L)  Total Protein 6.5 - 8.1 g/dL - 5.1(L) 4.8(L)  Total Bilirubin 0.3 - 1.2 mg/dL - 1.3(H) 1.7(H)  Alkaline Phos 38 - 126 U/L - 68 67  AST 15 - 41 U/L - 23 28  ALT 0 - 44 U/L - 21 21    CXR: Left effusion  _______________________________________________________________  Assessment and Plan: POD 6 s/p CABG Atriclip.  Altered mental status improving   Neuro: non focal neural exam.  Slowly improving after dialysis.  Scheduled for HD today.  Will continue to assess CV: afib, rate improved with cardene gtt.  atriclip placed, so will hold off on anticoagulation for now Pulm: left effusion.  Will continue to follow after dialysis.  Will consider thoracentesis Renal: dialysis today GI: npo for now due to mental status.  If not improved, will place feeding tube this afternoon Heme: stable ID: afebrile, no leukocytosis Endo: SSI Dispo: continue ICU care  Melodie Bouillon, MD 03/02/2019 8:52 AM

## 2019-03-02 NOTE — Progress Notes (Signed)
      [address]       ,[place] [address]             [phone]      Resting comfortably  BP (!) 98/53   Pulse (!) 106   Temp (!) 96.7 F (35.9 C) (Axillary)   Resp 20   Ht 5\' 8"  (1.727 m)   Wt 78.3 kg   SpO2 100%   BMI 26.25 kg/m   Intake/Output Summary (Last 24 hours) at 03/02/2019 1738 Last data filed at 03/02/2019 1600 Gross per 24 hour  Intake 704.27 ml  Output 1669 ml  Net -964.73 ml   Mental status slowly improving  Remo Lipps C. Roxan Hockey, MD [facility] [phone]

## 2019-03-02 NOTE — Progress Notes (Signed)
[facility] NEPHROLOGY PROGRESS NOTE  Assessment/ Plan: Pt is a 84 y.o. yo male with HTN, CAD, stroke, DM, admitted with an NSTEMI and found to have three-vessel CAD status post CABG on 12/30.  Dialysis: [facility] 4h   400/800    73.5kg    3/2.25Ca bath  P4  Hep none L AVF Hectorol 4 Venofer 50 Mircera 75 (last 12/15)  #CAD/NSTEMI: LHC with severe multivessel CAD status post CABG.  Cardiology is following.  # ESRD, TTS: Plan for dialysis today.   # Anemia due to CKD: Received blood transfusion postop.  Start Aranesp weekly.   Monitor lab.  # Secondary hyperparathyroidism: Continue vitamin D and binders.  Check phosphorus level tomorrow.  # HTN/volume: Blood pressure acceptable.  #Paroxysmal A. fib: On IV amiodarone and plan for restarting Coumadin noted.   Subjective: Seen and examined at bedside.  Patient was confused and agitated today.  Review of system is limited. Objective Vital signs in last 24 hours: Vitals:   03/02/19 0400 03/02/19 0500 03/02/19 0600 03/02/19 0700  BP: (!) 125/97 (!) 105/46 (!) 114/99   Pulse: 65 (!) 28 (!) 111 (!) 134  Resp: (!) 27 (!) 26 19 (!) 25  Temp: 97.6 F (36.4 C)     TempSrc: Axillary     SpO2: 94% (!) 87% 99% 100%  Weight:  80.3 kg    Height:       Weight change: 1.2 kg  Intake/Output Summary (Last 24 hours) at 03/02/2019 0109 Last data filed at 03/02/2019 0700 Gross per 24 hour  Intake 632.48 ml  Output --  Net 632.48 ml       Labs: Basic Metabolic Panel: Recent Labs  Lab 02/06/2019 0545 02/20/2019 0815 02/28/19 0250 03/01/19 0526 03/02/19 0506  NA 140  --  139 141 142  K 4.5  --  4.2 4.2 4.0  CL 102  --  103 102 101  CO2 24   < > 19* 19* 21*  GLUCOSE 95  --  156* 132* 107*  BUN 16  --  24* 42* 55*  CREATININE 3.61*  --  4.78* 6.26* 7.17*  CALCIUM 8.7*   < > 8.4* 8.7* 8.8*  PHOS 2.9  --   --   --   --    < > = values in this interval not displayed.   Liver Function Tests: Recent Labs  Lab  02/23/2019 0545 02/28/19 0250 03/01/19 0526  AST  --  28 23  ALT  --  21 21  ALKPHOS  --  67 68  BILITOT  --  1.7* 1.3*  PROT  --  4.8* 5.1*  ALBUMIN 3.3* 2.3* 2.2*   No results for input(s): LIPASE, AMYLASE in the last 168 hours. No results for input(s): AMMONIA in the last 168 hours. CBC: Recent Labs  Lab 02/14/2019 0545 02/25/2019 0815 02/26/19 0420 02/27/19 0437 02/28/19 0250 03/01/19 0526 03/02/19 0506  WBC 6.2   < > 6.3 6.5 8.0 8.7 9.8  NEUTROABS 4.3  --   --   --   --   --   --   HGB 11.6*  --  7.8* 7.1* 9.5* 10.1* 10.0*  HCT 36.0*  --  24.5* 22.1* 28.4* 29.8* 30.0*  MCV 96.5   < > 96.1 96.9 90.7 90.3 90.9  PLT 118*   < > 77* 76* 84* 82* 100*   < > = values in this interval not displayed.   Cardiac Enzymes: No results for input(s): CKTOTAL,  CKMB, CKMBINDEX, TROPONINI in the last 168 hours. CBG: Recent Labs  Lab 03/01/19 2002 03/01/19 2359 03/02/19 0509 03/02/19 0514 03/02/19 0728  GLUCAP 144* 133* 64* 115* 95    Iron Studies: No results for input(s): IRON, TIBC, TRANSFERRIN, FERRITIN in the last 72 hours. Studies/Results: DG Chest Port 1 View  Result Date: 03/01/2019 CLINICAL DATA:  Left pleural effusion EXAM: PORTABLE CHEST 1 VIEW COMPARISON:  Yesterday FINDINGS: Unchanged hazy left pulmonary opacity, attributed to postoperative pleural fluid and atelectasis. CABG. Left IJ sheath with tip just left of midline, unchanged. No visible pneumothorax. IMPRESSION: Stable left-sided pleural effusion and atelectasis. Electronically Signed   By: Monte Fantasia M.D.   On: 03/01/2019 06:33    Medications: Infusions: . sodium chloride    . sodium chloride Stopped (02/27/19 1901)  . amiodarone 30 mg/hr (03/02/19 0700)  . diltiazem (CARDIZEM) infusion 12.5 mg/hr (03/02/19 0700)    Scheduled Medications: . aspirin EC  325 mg Oral Daily   Or  . aspirin  324 mg Per Tube Daily  . Chlorhexidine Gluconate Cloth  6 each Topical Q0600  . enoxaparin (LOVENOX) injection  30  mg Subcutaneous Q48H  . insulin aspart  0-24 Units Subcutaneous Q4H  . levothyroxine  50 mcg Oral Q0600  . mouth rinse  15 mL Mouth Rinse BID  . pantoprazole (PROTONIX) IV  40 mg Intravenous Daily  . rosuvastatin  40 mg Oral q1800  . sodium chloride flush  10-40 mL Intracatheter Q12H  . sodium chloride flush  3 mL Intravenous Q12H    have reviewed scheduled and prn medications.  Physical Exam: General:agitated, confused and on restraint. Heart:RRR, s1s2 nl Lungs:clear b/l, no crackle, mild expiratory wheeze Abdomen:soft, Non-tender, non-distended Extremities: Trace LE edema Dialysis Access: Left upper extremity AV fistula has bruit and thrill.  Murdock Jellison Tanna Furry 03/02/2019,8:22 AM  LOS: 7 days  Pager: [phone]

## 2019-03-02 NOTE — Progress Notes (Signed)
Dr. Kipp Brood agreed to order for soft restraints to prevent patient from pulling out his NG tube. RN is monitoring patient and currently patient is asleep. Will continue to monitor for necessity of restraint order.

## 2019-03-03 LAB — CBC
HCT: 28.5 % — ABNORMAL LOW (ref 39.0–52.0)
Hemoglobin: 9.9 g/dL — ABNORMAL LOW (ref 13.0–17.0)
MCH: 30.2 pg (ref 26.0–34.0)
MCHC: 34.7 g/dL (ref 30.0–36.0)
MCV: 86.9 fL (ref 80.0–100.0)
Platelets: 72 10*3/uL — ABNORMAL LOW (ref 150–400)
RBC: 3.28 MIL/uL — ABNORMAL LOW (ref 4.22–5.81)
RDW: 18.6 % — ABNORMAL HIGH (ref 11.5–15.5)
WBC: 6.6 10*3/uL (ref 4.0–10.5)
nRBC: 0.3 % — ABNORMAL HIGH (ref 0.0–0.2)

## 2019-03-03 LAB — GLUCOSE, CAPILLARY
Glucose-Capillary: 166 mg/dL — ABNORMAL HIGH (ref 70–99)
Glucose-Capillary: 205 mg/dL — ABNORMAL HIGH (ref 70–99)
Glucose-Capillary: 153 mg/dL — ABNORMAL HIGH (ref 70–99)
Glucose-Capillary: 194 mg/dL — ABNORMAL HIGH (ref 70–99)
Glucose-Capillary: 142 mg/dL — ABNORMAL HIGH (ref 70–99)
Glucose-Capillary: 177 mg/dL — ABNORMAL HIGH (ref 70–99)

## 2019-03-03 LAB — BASIC METABOLIC PANEL
Anion gap: 16 — ABNORMAL HIGH (ref 5–15)
BUN: 42 mg/dL — ABNORMAL HIGH (ref 8–23)
CO2: 26 mmol/L (ref 22–32)
Calcium: 8.5 mg/dL — ABNORMAL LOW (ref 8.9–10.3)
Chloride: 94 mmol/L — ABNORMAL LOW (ref 98–111)
Creatinine, Ser: 4.97 mg/dL — ABNORMAL HIGH (ref 0.61–1.24)
GFR calc Af Amer: 12 mL/min — ABNORMAL LOW (ref >60)
GFR calc non Af Amer: 10 mL/min — ABNORMAL LOW (ref >60)
Glucose, Bld: 168 mg/dL — ABNORMAL HIGH (ref 70–99)
Potassium: 3.5 mmol/L (ref 3.5–5.1)
Sodium: 136 mmol/L (ref 135–145)

## 2019-03-03 LAB — MAGNESIUM
Magnesium: 2 mg/dL (ref 1.7–2.4)
Magnesium: 2 mg/dL (ref 1.7–2.4)
Magnesium: 2.1 mg/dL (ref 1.7–2.4)

## 2019-03-03 LAB — PHOSPHORUS
Phosphorus: 5.2 mg/dL — ABNORMAL HIGH (ref 2.5–4.6)
Phosphorus: 5.2 mg/dL — ABNORMAL HIGH (ref 2.5–4.6)
Phosphorus: 5.2 mg/dL — ABNORMAL HIGH (ref 2.5–4.6)

## 2019-03-03 MED ORDER — CHLORHEXIDINE GLUCONATE CLOTH 2 % EX PADS
6.0000 | MEDICATED_PAD | Freq: Every day | CUTANEOUS | Status: DC
  Administered 2019-03-03: 13:00:00 6 via TOPICAL

## 2019-03-03 MED ORDER — POTASSIUM CHLORIDE 10 MEQ/50ML IV SOLN
10.0000 meq | INTRAVENOUS | Status: AC
  Filled 2019-03-03: qty 50

## 2019-03-03 NOTE — Progress Notes (Signed)
Progress Note  Patient Name: Robert [name]. Date of Encounter: 03/03/2019  Primary Cardiologist: Shirlee More, MD   Subjective   Mental status improved this AM; chest "sore"  Inpatient Medications    Scheduled Meds: . aspirin EC  325 mg Oral Daily   Or  . aspirin  324 mg Per Tube Daily  . Chlorhexidine Gluconate Cloth  6 each Topical Q0600  . darbepoetin (ARANESP) injection - DIALYSIS  60 mcg Intravenous Q Tue-HD  . enoxaparin (LOVENOX) injection  30 mg Subcutaneous Q48H  . feeding supplement (PRO-STAT SUGAR FREE 64)  30 mL Per Tube BID  . insulin aspart  0-24 Units Subcutaneous Q4H  . levothyroxine  50 mcg Oral Q0600  . mouth rinse  15 mL Mouth Rinse BID  . pantoprazole (PROTONIX) IV  40 mg Intravenous Daily  . rosuvastatin  40 mg Oral q1800  . sodium chloride flush  10-40 mL Intracatheter Q12H  . sodium chloride flush  3 mL Intravenous Q12H   Continuous Infusions: . sodium chloride    . sodium chloride Stopped (02/27/19 1901)  . amiodarone 30 mg/hr (03/03/19 0400)  . diltiazem (CARDIZEM) infusion 10 mg/hr (03/03/19 0400)  . feeding supplement (OSMOLITE 1.5 CAL) 1,000 mL (03/02/19 1447)  . potassium chloride     PRN Meds: albuterol, budesonide (PULMICORT) nebulizer solution, dextrose, metoprolol tartrate, morphine injection, ondansetron (ZOFRAN) IV, sodium chloride flush, sodium chloride flush   Vital Signs    Vitals:   03/03/19 0300 03/03/19 0400 03/03/19 0500 03/03/19 0600  BP: (!) 128/46 125/66 133/63 (!) 127/50  Pulse: (!) 44 65 (!) 39 (!) 50  Resp: 18 17 17 18   Temp:      TempSrc:      SpO2: 97% 98% 99% 100%  Weight:   78.8 kg   Height:        Intake/Output Summary (Last 24 hours) at 03/03/2019 0733 Last data filed at 03/03/2019 0400 Gross per 24 hour  Intake 866.62 ml  Output 1669 ml  Net -802.38 ml   Last 3 Weights 03/03/2019 03/02/2019 03/02/2019  Weight (lbs) 173 lb 11.6 oz 172 lb 9.9 oz 176 lb 12.9 oz  Weight (kg) 78.8 kg 78.3 kg 80.2 kg       Telemetry    Sinus with PACS and PVCs- Personally Reviewed  Physical Exam   GEN: NAD Neck: No JVD Cardiac: RRR, 2/6 systolic murmur Respiratory: CTA anteriorly; no wheeze GI: Soft, NT/ND, no masses MS: No edema Neuro: Moves all ext; mental status improved  Labs    High Sensitivity Troponin:   Recent Labs  Lab 02/19/2019 1736 02/07/2019 1922 01/31/2019 2200 02/05/2019 0124 02/10/2019 0955  TROPONINIHS 81* 856* 4,043* 4,270* 4,018*      Chemistry Recent Labs  Lab 02/28/19 0250 03/01/19 0526 03/02/19 0506 03/03/19 0535  NA 139 141 142 136  K 4.2 4.2 4.0 3.5  CL 103 102 101 94*  CO2 19* 19* 21* 26  GLUCOSE 156* 132* 107* 168*  BUN 24* 42* 55* 42*  CREATININE 4.78* 6.26* 7.17* 4.97*  CALCIUM 8.4* 8.7* 8.8* 8.5*  PROT 4.8* 5.1*  --   --   ALBUMIN 2.3* 2.2*  --   --   AST 28 23  --   --   ALT 21 21  --   --   ALKPHOS 67 68  --   --   BILITOT 1.7* 1.3*  --   --   GFRNONAA 11* 8* 6* 10*  GFRAA 12* 9*  8* 12*  ANIONGAP 17* 20* 20* 16*     Hematology Recent Labs  Lab 03/01/19 0526 03/02/19 0506 03/03/19 0535  WBC 8.7 9.8 6.6  RBC 3.30* 3.30* 3.28*  HGB 10.1* 10.0* 9.9*  HCT 29.8* 30.0* 28.5*  MCV 90.3 90.9 86.9  MCH 30.6 30.3 30.2  MCHC 33.9 33.3 34.7  RDW 18.0* 18.6* 18.6*  PLT 82* 100* 72*    Patient Profile     Robert [name]. is a 84 y.o. male with ESRD, HTN, CAD s/p PCI, Stroke s/p CEA, DM admitted 12/29 for new-onset Afib. Subsequently had NSTEMI and found to have 3vCAD. S/p CABG x 4 02/25/2019.  Note preoperative echocardiogram showed normal LV function, mild mitral regurgitation, moderate aortic stenosis.  Assessment & Plan    1 paroxysmal atrial fibrillation-back in sinus this AM. Continue IV amiodarone and cardizem. Change amiodarone to 200 mg daily and cardizem to 240 mg daily when tolerating PO.  Began Coumadin when okay with surgery.  2 NSTEMI/coronary artery disease status post coronary artery bypass and graft-continue ASA and Crestor 40  mg daily.  3 moderate aortic stenosis-follow-up as an outpatient. May need TAVR in the future.  4 end-stage renal disease-dialysis per nephrology.  5 history of stroke status post carotid endarterectomy-medical therapy with aspirin and statin.  For questions or updates, please contact [place] Please consult [email or web address] for contact info under        Signed, Kirk Ruths, MD  03/03/2019, 7:33 AM

## 2019-03-03 NOTE — Plan of Care (Signed)
  Problem: Clinical Measurements: Goal: Cardiovascular complication will be avoided Outcome: Progressing   Problem: Clinical Measurements: Goal: Respiratory complications will improve Outcome: Progressing   Problem: Nutrition: Goal: Adequate nutrition will be maintained Outcome: Progressing

## 2019-03-03 NOTE — Progress Notes (Signed)
  Speech Language Pathology Treatment: Dysphagia  Patient Details Name: Robert [name]. MRN: [id number] DOB: 1935-10-16 Today's Date: 03/03/2019 Time: 9179-1505 SLP Time Calculation (min) (ACUTE ONLY): 21 min  Assessment / Plan / Recommendation Clinical Impression  Pt much more alert, cooperative today.  Confusion persists. Voice is strong/clear; RR well within parameters necessary for breathing/swallowing reciprocity.  Oral care provided - improved palate, posterior oral cavity; tongue remains coated.  With verbal cues, pt accepted trials of ice chips, water from spoon/edge of cup, and tspns of pudding with adequate attention, mild oral residue post-swallow; no overt s/s of aspiration.  Recommend initiating full liquids; keep cortrak in place for now. SLP will follow for toleration/safety, necessity of instrumental swallow study. D/W RN.  Hold tray if coughing with POs.   HPI HPI: Robert [name] has PMH: stroke, 2018, GERD, esophageal dilation 03/18/2010, ESRD, DM, MI, CAD. The patient presented with generalized weakness and tachycardia. Underwent CABG x 4 12/30, intubawted 12/30-12/31. CXR significant for cardiomegaly with no acute changes. New onset A-fib. MBS 2018 following stroke recommending puree, thin (vocal cord penetration).        SLP Plan  Continue with current plan of care       Recommendations  Diet recommendations: Other(comment)(full liquids) Liquids provided via: Cup;Straw Medication Administration: Via alternative means Supervision: Full supervision/cueing for compensatory strategies Compensations: Minimize environmental distractions Postural Changes and/or Swallow Maneuvers: Seated upright 90 degrees                Oral Care Recommendations: Oral care BID SLP Visit Diagnosis: Dysphagia, unspecified (R13.10) Plan: Continue with current plan of care       GO               Robert [name] L. Tivis Ringer, [place] Office number  [phone] Pager [phone]  Robert [name] 03/03/2019, 11:52 AM

## 2019-03-03 NOTE — Progress Notes (Signed)
Patient ID: Robert Sarks., male   DOB: 1935/06/06, 84 y.o.   MRN: [id number] TCTS Evening Rounds:  Has been more alert today. Hemodynamically stable in atrial fib on amio and cardizem IV with HR 80's.  Passed swallowing eval today and liquids started.  Plan HD tomorrow.

## 2019-03-03 NOTE — Progress Notes (Signed)
[facility] NEPHROLOGY PROGRESS NOTE  Assessment/ Plan: Pt is a 84 y.o. yo male with HTN, CAD, stroke, DM, admitted with an NSTEMI and found to have three-vessel CAD status post CABG on 12/30.  Dialysis: [facility] 4h   400/800    73.5kg    3/2.25Ca bath  P4  Hep none L AVF Hectorol 4 Venofer 50 Mircera 75 (last 12/15)  #CAD/NSTEMI: LHC with severe multivessel CAD status post CABG.  Cardiology is following.  # ESRD, TTS: Status post dialysis yesterday with 1.6 L UF, tolerated well.  Plan for next HD tomorrow.  # Anemia due to CKD: Received blood transfusion postop.  Started Aranesp weekly.   Monitor lab.  # Secondary hyperparathyroidism: Continue vitamin D and binders.  Phosphorus level at goal.  # HTN/volume: Blood pressure acceptable.  #Paroxysmal A. fib: On IV amiodarone and plan to restarting Coumadin.   #AMS, agitation: Patient is confused.  Requiring soft restraint.  Subjective: Seen and examined at bedside.  Patient is confused and currently has soft restraint.  He tolerated dialysis well yesterday.  No new event.  Objective Vital signs in last 24 hours: Vitals:   03/03/19 0500 03/03/19 0600 03/03/19 0700 03/03/19 0800  BP: 133/63 (!) 127/50 (!) 107/32   Pulse: (!) 39 (!) 50 (!) 35 68  Resp: 17 18 17  (!) 21  Temp:   98.3 F (36.8 C)   TempSrc:   Oral   SpO2: 99% 100% 99% 94%  Weight: 78.8 kg     Height:       Weight change: -0.1 kg  Intake/Output Summary (Last 24 hours) at 03/03/2019 0829 Last data filed at 03/03/2019 0400 Gross per 24 hour  Intake 837.49 ml  Output 1669 ml  Net -831.51 ml       Labs: Basic Metabolic Panel: Recent Labs  Lab 03/01/19 0526 03/02/19 0506 03/02/19 1451 03/02/19 2339 03/03/19 0535  NA 141 142  --   --  136  K 4.2 4.0  --   --  3.5  CL 102 101  --   --  94*  CO2 19* 21*  --   --  26  GLUCOSE 132* 107*  --   --  168*  BUN 42* 55*  --   --  42*  CREATININE 6.26* 7.17*  --   --  4.97*  CALCIUM 8.7* 8.8*   --   --  8.5*  PHOS  --   --  3.4 5.2* 5.2*   Liver Function Tests: Recent Labs  Lab 02/28/19 0250 03/01/19 0526  AST 28 23  ALT 21 21  ALKPHOS 67 68  BILITOT 1.7* 1.3*  PROT 4.8* 5.1*  ALBUMIN 2.3* 2.2*   No results for input(s): LIPASE, AMYLASE in the last 168 hours. No results for input(s): AMMONIA in the last 168 hours. CBC: Recent Labs  Lab 02/27/19 0437 02/28/19 0250 03/01/19 0526 03/02/19 0506 03/03/19 0535  WBC 6.5 8.0 8.7 9.8 6.6  HGB 7.1* 9.5* 10.1* 10.0* 9.9*  HCT 22.1* 28.4* 29.8* 30.0* 28.5*  MCV 96.9 90.7 90.3 90.9 86.9  PLT 76* 84* 82* 100* 72*   Cardiac Enzymes: No results for input(s): CKTOTAL, CKMB, CKMBINDEX, TROPONINI in the last 168 hours. CBG: Recent Labs  Lab 03/02/19 1536 03/02/19 1918 03/03/19 0104 03/03/19 0456 03/03/19 0741  GLUCAP 93 135* 166* 205* 153*    Iron Studies: No results for input(s): IRON, TIBC, TRANSFERRIN, FERRITIN in the last 72 hours. Studies/Results: No results found.  Medications:  Infusions: . sodium chloride    . sodium chloride Stopped (02/27/19 1901)  . amiodarone 30 mg/hr (03/03/19 0400)  . diltiazem (CARDIZEM) infusion 10 mg/hr (03/03/19 0400)  . feeding supplement (OSMOLITE 1.5 CAL) 1,000 mL (03/02/19 1447)  . potassium chloride      Scheduled Medications: . aspirin EC  325 mg Oral Daily   Or  . aspirin  324 mg Per Tube Daily  . Chlorhexidine Gluconate Cloth  6 each Topical Q0600  . darbepoetin (ARANESP) injection - DIALYSIS  60 mcg Intravenous Q Tue-HD  . enoxaparin (LOVENOX) injection  30 mg Subcutaneous Q48H  . feeding supplement (PRO-STAT SUGAR FREE 64)  30 mL Per Tube BID  . insulin aspart  0-24 Units Subcutaneous Q4H  . levothyroxine  50 mcg Oral Q0600  . mouth rinse  15 mL Mouth Rinse BID  . pantoprazole (PROTONIX) IV  40 mg Intravenous Daily  . rosuvastatin  40 mg Oral q1800  . sodium chloride flush  10-40 mL Intracatheter Q12H  . sodium chloride flush  3 mL Intravenous Q12H    have  reviewed scheduled and prn medications.  Physical Exam: General: Lying on bed comfortable, on soft restraint, confused Heart:RRR, s1s2 nl Lungs:clear b/l, no crackle, mild expiratory wheeze Abdomen:soft, Non-tender, non-distended Extremities: Trace LE edema Dialysis Access: Left upper extremity AV fistula has bruit and thrill.  [place] Tanna Furry 03/03/2019,8:29 AM  LOS: 8 days  Pager: [phone]

## 2019-03-03 NOTE — Progress Notes (Signed)
      [address]       [place] [address]             [phone]                 7 Days Post-Op Procedure(s) (LRB): CORONARY ARTERY BYPASS GRAFTING (CABG) using endoscopic greater saphenous vein harvest: SVC to Diag1; SVC to OM1; SVC to PDA and LIMA to LAD. (N/A) Clipping Of Atrial Appendage using 45 MM AtriCure AtriClip (N/A)   Events: Mental status much improved today _______________________________________________________________ Vitals: BP (!) 107/32   Pulse 68   Temp 98.3 F (36.8 C) (Oral)   Resp (!) 21   Ht 5\' 8"  (1.727 m)   Wt 78.8 kg   SpO2 94%   BMI 26.41 kg/m   - Neuro: alert, NAD  - Cardiovascular: afib  Drips: amio, and cardene.      - Pulm: EWOB on [place]    ABG    Component Value Date/Time   PHART 7.323 (L) 02/25/2019 1150   PCO2ART 32.3 02/25/2019 1150   PO2ART 61.0 (L) 02/25/2019 1150   HCO3 16.8 (L) 02/25/2019 1150   TCO2 18 (L) 02/25/2019 1150   ACIDBASEDEF 9.0 (H) 02/25/2019 1150   O2SAT 90.0 02/25/2019 1150    - Abd: soft - Extremity: warm  .Intake/Output      01/05 0701 - 01/06 0700 01/06 0701 - 01/07 0700   P.O. 0    I.V. (mL/kg) 622.3 (7.9)    NG/GT 244.3    Total Intake(mL/kg) 866.6 (11)    Urine (mL/kg/hr)     Other 1669    Total Output 1669    Net -802.4            _______________________________________________________________ Labs: CBC Latest Ref Rng & Units 03/03/2019 03/02/2019 03/01/2019  WBC 4.0 - 10.5 K/uL 6.6 9.8 8.7  Hemoglobin 13.0 - 17.0 g/dL 9.9(L) 10.0(L) 10.1(L)  Hematocrit 39.0 - 52.0 % 28.5(L) 30.0(L) 29.8(L)  Platelets 150 - 400 K/uL 72(L) 100(L) 82(L)   CMP Latest Ref Rng & Units 03/03/2019 03/02/2019 03/01/2019  Glucose 70 - 99 mg/dL 168(H) 107(H) 132(H)  BUN 8 - 23 mg/dL 42(H) 55(H) 42(H)  Creatinine 0.61 - 1.24 mg/dL 4.97(H) 7.17(H) 6.26(H)  Sodium 135 - 145 mmol/L 136 142 141  Potassium 3.5 - 5.1 mmol/L 3.5 4.0 4.2  Chloride 98 - 111 mmol/L 94(L) 101 102  CO2 22 - 32 mmol/L 26 21(L)  19(L)  Calcium 8.9 - 10.3 mg/dL 8.5(L) 8.8(L) 8.7(L)  Total Protein 6.5 - 8.1 g/dL - - 5.1(L)  Total Bilirubin 0.3 - 1.2 mg/dL - - 1.3(H)  Alkaline Phos 38 - 126 U/L - - 68  AST 15 - 41 U/L - - 23  ALT 0 - 44 U/L - - 21    CXR: Left effusion  _______________________________________________________________  Assessment and Plan: POD 7 s/p CABG Atriclip.  Altered mental status improving   Neuro: mental status much improved after dialysis.  Will continue to follow CV: afib, rate improved with cardene gtt.  atriclip placed, so will hold off on anticoagulation for now Pulm: remains on [place].  More pulm toilet now that mental status is improved Renal: dialysis tomorrow GI: tubefeeds started.  Hopefully will transition to PO intake soon Heme: stable ID: afebrile, no leukocytosis Endo: SSI Dispo: continue ICU care  Melodie Bouillon, MD 03/03/2019 9:33 AM

## 2019-03-04 ENCOUNTER — Inpatient Hospital Stay (HOSPITAL_COMMUNITY): Payer: Medicare Other

## 2019-03-04 DIAGNOSIS — E440 Moderate protein-calorie malnutrition: Secondary | ICD-10-CM | POA: Insufficient documentation

## 2019-03-04 LAB — GLUCOSE, CAPILLARY
Glucose-Capillary: 112 mg/dL — ABNORMAL HIGH (ref 70–99)
Glucose-Capillary: 150 mg/dL — ABNORMAL HIGH (ref 70–99)
Glucose-Capillary: 165 mg/dL — ABNORMAL HIGH (ref 70–99)
Glucose-Capillary: 180 mg/dL — ABNORMAL HIGH (ref 70–99)
Glucose-Capillary: 184 mg/dL — ABNORMAL HIGH (ref 70–99)
Glucose-Capillary: 239 mg/dL — ABNORMAL HIGH (ref 70–99)
Glucose-Capillary: 243 mg/dL — ABNORMAL HIGH (ref 70–99)

## 2019-03-04 LAB — BASIC METABOLIC PANEL
Anion gap: 16 — ABNORMAL HIGH (ref 5–15)
BUN: 63 mg/dL — ABNORMAL HIGH (ref 8–23)
CO2: 24 mmol/L (ref 22–32)
Calcium: 8.5 mg/dL — ABNORMAL LOW (ref 8.9–10.3)
Chloride: 95 mmol/L — ABNORMAL LOW (ref 98–111)
Creatinine, Ser: 5.73 mg/dL — ABNORMAL HIGH (ref 0.61–1.24)
GFR calc Af Amer: 10 mL/min — ABNORMAL LOW (ref >60)
GFR calc non Af Amer: 9 mL/min — ABNORMAL LOW (ref >60)
Glucose, Bld: 130 mg/dL — ABNORMAL HIGH (ref 70–99)
Potassium: 4.1 mmol/L (ref 3.5–5.1)
Sodium: 135 mmol/L (ref 135–145)

## 2019-03-04 LAB — CBC
HCT: 26.9 % — ABNORMAL LOW (ref 39.0–52.0)
Hemoglobin: 9.7 g/dL — ABNORMAL LOW (ref 13.0–17.0)
MCH: 31 pg (ref 26.0–34.0)
MCHC: 36.1 g/dL — ABNORMAL HIGH (ref 30.0–36.0)
MCV: 85.9 fL (ref 80.0–100.0)
Platelets: 85 10*3/uL — ABNORMAL LOW (ref 150–400)
RBC: 3.13 MIL/uL — ABNORMAL LOW (ref 4.22–5.81)
RDW: 18.7 % — ABNORMAL HIGH (ref 11.5–15.5)
WBC: 7.5 10*3/uL (ref 4.0–10.5)
nRBC: 0.3 % — ABNORMAL HIGH (ref 0.0–0.2)

## 2019-03-04 MED ORDER — PRO-STAT SUGAR FREE PO LIQD
30.0000 mL | Freq: Three times a day (TID) | ORAL | Status: DC
  Administered 2019-03-04 – 2019-03-09 (×15): 30 mL
  Filled 2019-03-04 (×13): qty 30

## 2019-03-04 MED ORDER — NEPRO/CARBSTEADY PO LIQD
1000.0000 mL | ORAL | Status: DC
  Administered 2019-03-04 – 2019-03-08 (×6): 1000 mL

## 2019-03-04 MED ORDER — DOXERCALCIFEROL 4 MCG/2ML IV SOLN
4.0000 ug | INTRAVENOUS | Status: DC
  Administered 2019-03-06: 16:00:00 4 ug via INTRAVENOUS
  Filled 2019-03-04 (×4): qty 2

## 2019-03-04 MED ORDER — RENA-VITE PO TABS
1.0000 | ORAL_TABLET | Freq: Every day | ORAL | Status: DC
  Administered 2019-03-04 – 2019-03-08 (×5): 1 via ORAL
  Filled 2019-03-04 (×5): qty 1

## 2019-03-04 MED ORDER — PANTOPRAZOLE SODIUM 40 MG PO TBEC
40.0000 mg | DELAYED_RELEASE_TABLET | Freq: Every day | ORAL | Status: DC
  Administered 2019-03-05 – 2019-03-08 (×4): 40 mg via ORAL
  Filled 2019-03-04 (×5): qty 1

## 2019-03-04 MED ORDER — ENSURE ENLIVE PO LIQD
237.0000 mL | Freq: Three times a day (TID) | ORAL | Status: DC
  Administered 2019-03-04 – 2019-03-05 (×5): 237 mL via ORAL

## 2019-03-04 MED ORDER — CALCIUM ACETATE (PHOS BINDER) 667 MG PO CAPS
667.0000 mg | ORAL_CAPSULE | Freq: Three times a day (TID) | ORAL | Status: DC
  Administered 2019-03-04 – 2019-03-06 (×9): 667 mg via ORAL
  Filled 2019-03-04 (×9): qty 1

## 2019-03-04 NOTE — Progress Notes (Signed)
Daughter updated over the phone regarding patient's condition.

## 2019-03-04 NOTE — Progress Notes (Signed)
Patient ID: Robert [name]., male   DOB: [birth year], 84 y.o.   MRN: [id number] EVENING ROUNDS NOTE :     [address]       [place] [address]             [phone]                 8 Days Post-Op Procedure(s) (LRB): CORONARY ARTERY BYPASS GRAFTING (CABG) using endoscopic greater saphenous vein harvest: SVC to Diag1; SVC to OM1; SVC to PDA and LIMA to LAD. (N/A) Clipping Of Atrial Appendage using 45 MM AtriCure AtriClip (N/A)  Total Length of Stay:  LOS: 9 days  BP 110/62 (BP Location: Right Arm)   Pulse (!) 111   Temp 98.3 F (36.8 C) (Oral)   Resp 20   Ht 5\' 8"  (1.727 m)   Wt 80.2 kg   SpO2 100%   BMI 26.88 kg/m   .Intake/Output      01/06 0701 - 01/07 0700 01/07 0701 - 01/08 0700   P.O. 120 60   I.V. (mL/kg) 665.5 (8.3) 159.7 (2)   NG/GT 1080    Total Intake(mL/kg) 1865.5 (23.3) 219.7 (2.7)   Urine (mL/kg/hr) 100 (0.1)    Other     Total Output 100    Net +1765.5 +219.7          . sodium chloride    . sodium chloride Stopped (02/27/19 1901)  . amiodarone 30 mg/hr (03/24/2019 1215)  . diltiazem (CARDIZEM) infusion 2.5 mg/hr (03/15/2019 1215)     Lab Results  Component Value Date   WBC 7.5 03/27/2019   HGB 9.7 (L) 03/15/2019   HCT 26.9 (L) 03/15/2019   PLT 85 (L) 03/08/2019   GLUCOSE 130 (H) 03/14/2019   CHOL 112 02/23/2019   TRIG 157 (H) 02/05/2019   HDL 29 (L) 01/28/2019   LDLCALC 52 02/02/2019   ALT 21 03/01/2019   AST 23 03/01/2019   NA 135 03/25/2019   K 4.1 03/26/2019   CL 95 (L) 03/19/2019   CREATININE 5.73 (H) 03/11/2019   BUN 63 (H) 03/01/2019   CO2 24 03/06/2019   TSH 3.084 01/31/2019   INR 1.6 (H) 01/31/2019   HGBA1C 7.2 (H) 02/16/2019   Still in afib, on amnidrione D/c central line soon dialysis tonight    Grace Isaac MD  Beeper [phone] Office [phone] 03/20/2019 5:51 PM

## 2019-03-04 NOTE — Discharge Instructions (Signed)
Discharge Instructions:  1. You may shower, please wash incisions daily with soap and water and keep dry.  If you wish to cover wounds with dressing you may do so but please keep clean and change daily.  No tub baths or swimming until incisions have completely healed.  If your incisions become red or develop any drainage please call our office at [phone]  2. No Driving until cleared by Dr. Abran Duke office and you are no longer using narcotic pain medications  3. Monitor your weight daily.. Please use the same scale and weigh at same time... If you gain 5-10 lbs in 48 hours with associated lower extremity swelling, please contact our office at [phone]  4. Fever of 101.5 for at least 24 hours with no source, please contact our office at [phone]  5. Activity- up as tolerated, please walk at least 3 times per day.  Avoid strenuous activity, no lifting, pushing, or pulling with your arms over 8-10 lbs for a minimum of 6 weeks  6. If any questions or concerns arise, please do not hesitate to contact our office at [phone] Atrial Fibrillation  Atrial fibrillation is a type of heartbeat that is irregular or fast. If you have this condition, your heart beats without any order. This makes it hard for your heart to pump blood in a normal way. Atrial fibrillation may come and go, or it may become a long-lasting problem. If this condition is not treated, it can put you at higher risk for stroke, heart failure, and other heart problems. What are the causes? This condition may be caused by diseases that damage the heart. They include:  High blood pressure.  Heart failure.  Heart valve disease.  Heart surgery. Other causes include:  Diabetes.  Thyroid disease.  Being overweight.  Kidney disease. Sometimes the cause is not known. What increases the risk? You are more likely to develop this condition if:  You are older.  You smoke.  You exercise often and very  hard.  You have a family history of this condition.  You are a man.  You use drugs.  You drink a lot of alcohol.  You have lung conditions, such as emphysema, pneumonia, or COPD.  You have sleep apnea. What are the signs or symptoms? Common symptoms of this condition include:  A feeling that your heart is beating very fast.  Chest pain or discomfort.  Feeling short of breath.  Suddenly feeling light-headed or weak.  Getting tired easily during activity.  Fainting.  Sweating. In some cases, there are no symptoms. How is this treated? Treatment for this condition depends on underlying conditions and how you feel when you have atrial fibrillation. They include:  Medicines to: ? Prevent blood clots. ? Treat heart rate or heart rhythm problems.  Using devices, such as a pacemaker, to correct heart rhythm problems.  Doing surgery to remove the part of the heart that sends bad signals.  Closing an area where clots can form in the heart (left atrial appendage). In some cases, your doctor will treat other underlying conditions. Follow these instructions at home: Medicines  Take over-the-counter and prescription medicines only as told by your doctor.  Do not take any new medicines without first talking to your doctor.  If you are taking blood thinners: ? Talk with your doctor before you take any medicines that have aspirin or NSAIDs, such as ibuprofen, in them. ? Take your medicine exactly as told by your doctor. Take it at the  same time each day. ? Avoid activities that could hurt or bruise you. Follow instructions about how to prevent falls. ? Wear a bracelet that says you are taking blood thinners. Or, carry a card that lists what medicines you take. Lifestyle      Do not use any products that have nicotine or tobacco in them. These include cigarettes, e-cigarettes, and chewing tobacco. If you need help quitting, ask your doctor.  Eat heart-healthy foods. Talk  with your doctor about the right eating plan for you.  Exercise regularly as told by your doctor.  Do not drink alcohol.  Lose weight if you are overweight.  Do not use drugs, including cannabis. General instructions  If you have a condition that causes breathing to stop for a short period of time (apnea), treat it as told by your doctor.  Keep a healthy weight. Do not use diet pills unless your doctor says they are safe for you. Diet pills may make heart problems worse.  Keep all follow-up visits as told by your doctor. This is important. Contact a doctor if:  You notice a change in the speed, rhythm, or strength of your heartbeat.  You are taking a blood-thinning medicine and you get more bruising.  You get tired more easily when you move or exercise.  You have a sudden change in weight. Get help right away if:   You have pain in your chest or your belly (abdomen).  You have trouble breathing.  You have side effects of blood thinners, such as blood in your vomit, poop (stool), or pee (urine), or bleeding that cannot stop.  You have any signs of a stroke. "BE FAST" is an easy way to remember the main warning signs: ? B - Balance. Signs are dizziness, sudden trouble walking, or loss of balance. ? E - Eyes. Signs are trouble seeing or a change in how you see. ? F - Face. Signs are sudden weakness or loss of feeling in the face, or the face or eyelid drooping on one side. ? A - Arms. Signs are weakness or loss of feeling in an arm. This happens suddenly and usually on one side of the body. ? S - Speech. Signs are sudden trouble speaking, slurred speech, or trouble understanding what people say. ? T - Time. Time to call emergency services. Write down what time symptoms started.  You have other signs of a stroke, such as: ? A sudden, very bad headache with no known cause. ? Feeling like you may vomit (nausea). ? Vomiting. ? A seizure. These symptoms may be an emergency. Do  not wait to see if the symptoms will go away. Get medical help right away. Call your local emergency services (911 in the [place] Do not drive yourself to the hospital. Summary  Atrial fibrillation is a type of heartbeat that is irregular or fast.  You are at higher risk of this condition if you smoke, are older, have diabetes, or are overweight.  Follow your doctor's instructions about medicines, diet, exercise, and follow-up visits.  Get help right away if you have signs or symptoms of a stroke.  Get help right away if you cannot catch your breath, or you have chest pain or discomfort. This information is not intended to replace advice given to you by your health care provider. Make sure you discuss any questions you have with your health care provider. Document Revised: 08/05/2018 Document Reviewed: 08/05/2018 Elsevier Patient Education  [place].

## 2019-03-04 NOTE — Progress Notes (Signed)
Nutrition Follow-up  DOCUMENTATION CODES:   Non-severe (moderate) malnutrition in context of chronic illness  INTERVENTION:   Tube Feeding: Nocturnal x 12 hours Nepro at 50 ml/hr x 12 hours (1800 to 0600 daily) Pro-Stat 30 mL TID Provides 94 g of protein, 1380 kcals and 438 mL of free water  Ensure Enlive po TID, each supplement provides 350 kcal and 20 grams of protein  Magic cup BID with meals, each supplement provides 290 kcal and 9 grams of protein  Add Rena-Vit daily  No BM since 1/1; pt may need scheduled bowel regimen  NUTRITION DIAGNOSIS:   Moderate Malnutrition related to chronic illness(ESRD/HD, CAD,) as evidenced by moderate fat depletion, moderate muscle depletion.  Being addressed via diet advancement, supplements  GOAL:   Patient will meet greater than or equal to 90% of their needs  Progressing  MONITOR:   TF tolerance, Diet advancement, Labs, Weight trends, Skin  REASON FOR ASSESSMENT:   Consult Enteral/tube feeding initiation and management  ASSESSMENT:   84 yo admitted with NSTEMI, found to have 3 vessel CAD and underwent CABG x 4. PMH includes ESRD on HD, HTN, CAD, stroke, DM   Mental status improving, diet advanced yesterday  Pt requires full assist with feedings, lots of encouragement. Pt appetite is poor. Pt ate some jello, bites of strained cream of chicken soup, sips of tea and sips of Ensure this AM per RN.   Pt reports pain with swallowing; SLP continues to follow. Noted plan for formal swallow study when able. Continues on [place] diet  Cortrak remains in place, TF discontinued by MD this AM  Concerned that pt with inadequate intake (essentuially NPO) for 7 days prior to Cortrak insertion on 1/05. TF infusing for <48 hours and po intake remains poor. Recommend Nocturnal TF support at this time. Discussed with MD and MD agreeable to nocturnal TF at this time  Noted no BM since 1/1. Pt may need scheduled bowel regimen  Labs: phosphorus 5.2  (acceptable for HD), CBGs 112-243 Meds: aranesp, ss novolog, phoslo ordered with meals    NUTRITION - FOCUSED PHYSICAL EXAM:    Most Recent Value  Orbital Region  Moderate depletion  Upper Arm Region  Mild depletion  Thoracic and Lumbar Region  Moderate depletion  Buccal Region  Moderate depletion  Temple Region  Severe depletion  Clavicle Bone Region  Severe depletion  Clavicle and Acromion Bone Region  Moderate depletion  Scapular Bone Region  Moderate depletion  Dorsal Hand  Unable to assess  Patellar Region  Mild depletion  Anterior Thigh Region  Mild depletion  Posterior Calf Region  Mild depletion  Edema (RD Assessment)  Mild       Diet Order:   Diet Order            Diet full liquid Room service appropriate? Yes; Fluid consistency: Thin  Diet effective now              EDUCATION NEEDS:   Not appropriate for education at this time  Skin:  Skin Assessment: Reviewed RN Assessment  Last BM:  1/1  Height:   Ht Readings from Last 1 Encounters:  02/24/2019 5\' 8"  (1.727 m)    Weight:   Wt Readings from Last 1 Encounters:  03/07/2019 80.2 kg    EDW: 73.5 kg  BMI:  Body mass index is 26.88 kg/m.  Estimated Nutritional Needs:   Kcal:  9924-2683 kcals  Protein:  92-110 g  Fluid:  1000 mL plus UOP  [employer] MS, RDN, LDN, CNSC [phone] Pager  [phone] Weekend/On-Call Pager

## 2019-03-04 NOTE — Progress Notes (Signed)
      [address]       [place] [address]             [phone]                 8 Days Post-Op Procedure(s) (LRB): CORONARY ARTERY BYPASS GRAFTING (CABG) using endoscopic greater saphenous vein harvest: SVC to Diag1; SVC to OM1; SVC to PDA and LIMA to LAD. (N/A) Clipping Of Atrial Appendage using 45 MM AtriCure AtriClip (N/A)   Events: Continues to improg _______________________________________________________________ Vitals: BP (!) 144/69 (BP Location: Right Arm)   Pulse (!) 42   Temp 98.3 F (36.8 C) (Oral)   Resp (!) 22   Ht 5\' 8"  (1.727 m)   Wt 80.2 kg   SpO2 98%   BMI 26.88 kg/m   - Neuro: alert, NAD  - Cardiovascular: afib  Drips: amio, and dilt      - Pulm: EWOB on [place]    ABG    Component Value Date/Time   PHART 7.323 (L) 02/25/2019 1150   PCO2ART 32.3 02/25/2019 1150   PO2ART 61.0 (L) 02/25/2019 1150   HCO3 16.8 (L) 02/25/2019 1150   TCO2 18 (L) 02/25/2019 1150   ACIDBASEDEF 9.0 (H) 02/25/2019 1150   O2SAT 90.0 02/25/2019 1150    - Abd: soft - Extremity: warm  .Intake/Output      01/06 0701 - 01/07 0700 01/07 0701 - 01/08 0700   P.O. 120    I.V. (mL/kg) 636.3 (7.9)    NG/GT 1080    Total Intake(mL/kg) 1836.3 (22.9)    Urine (mL/kg/hr) 100 (0.1)    Other     Total Output 100    Net +1736.3            _______________________________________________________________ Labs: CBC Latest Ref Rng & Units 03/24/2019 03/03/2019 03/02/2019  WBC 4.0 - 10.5 K/uL 7.5 6.6 9.8  Hemoglobin 13.0 - 17.0 g/dL 9.7(L) 9.9(L) 10.0(L)  Hematocrit 39.0 - 52.0 % 26.9(L) 28.5(L) 30.0(L)  Platelets 150 - 400 K/uL 85(L) 72(L) 100(L)   CMP Latest Ref Rng & Units 03/11/2019 03/03/2019 03/02/2019  Glucose 70 - 99 mg/dL 130(H) 168(H) 107(H)  BUN 8 - 23 mg/dL 63(H) 42(H) 55(H)  Creatinine 0.61 - 1.24 mg/dL 5.73(H) 4.97(H) 7.17(H)  Sodium 135 - 145 mmol/L 135 136 142  Potassium 3.5 - 5.1 mmol/L 4.1 3.5 4.0  Chloride 98 - 111 mmol/L 95(L) 94(L) 101  CO2 22 -  32 mmol/L 24 26 21(L)  Calcium 8.9 - 10.3 mg/dL 8.5(L) 8.5(L) 8.8(L)  Total Protein 6.5 - 8.1 g/dL - - -  Total Bilirubin 0.3 - 1.2 mg/dL - - -  Alkaline Phos 38 - 126 U/L - - -  AST 15 - 41 U/L - - -  ALT 0 - 44 U/L - - -    CXR: pending  _______________________________________________________________  Assessment and Plan: POD 8 s/p CABG Atriclip.  Altered mental status improving   Neuro: mental status continues to improve CV: afib, rate improved with dilt gtt.  atriclip placed, so will hold off on anticoagulation for now.  Will transition to PO meds Pulm: remains on [place].  More pulm toilet now that mental status is improved Renal: dialysis today GI: tubefeeds will be held.  Will encourage PO intake Heme: stable ID: afebrile, no leukocytosis Endo: SSI Dispo: continue ICU care  Melodie Bouillon, MD 02/28/2019 8:47 AM

## 2019-03-04 NOTE — Progress Notes (Signed)
[facility] NEPHROLOGY PROGRESS NOTE  Assessment/ Plan: Pt is a 84 y.o. yo male with HTN, CAD, stroke, DM, admitted with an NSTEMI and found to have three-vessel CAD status post CABG on 12/30.  Dialysis: [facility] 4h   400/800    73.5kg    3/2.25Ca bath  P4  Hep none L AVF Hectorol 4 Venofer 50 Mircera 75 (last 12/15)  #CAD/NSTEMI: LHC with severe multivessel CAD status post CABG.  Cardiology, CT surgery following.  # ESRD, HD per TTS schedule    # Anemia due to CKD: Received PRBC postop.  Aranesp weekly on Tuesdays - note was started 1/5 here   # Secondary hyperparathyroidism: Start hectorol.  Resume binders at lower dose with variable intake.  Phosphorus level at goal.  # HTN/volume: Blood pressure acceptable.  #Paroxysmal A. fib: On IV amiodarone.  Anticoagulation per primary team  #AMS, agitation: Patient is confused.    Subjective:  Last HD on 1/5 with 1.7 L UF.  He has remained confused and has required restraints.  Continued on amio gtt.  Spoke with nurse and she is going to replace a tight armband on his LUE.  Review of systems: limited with AMS though denies shortness of breath or chest pain; denies n/v    Objective Vital signs in last 24 hours: Vitals:   03/22/2019 0230 03/14/2019 0300 02/26/2019 0400 02/26/2019 0414  BP: (!) 154/84 (!) 157/60 (!) 144/69   Pulse: 63 88 (!) 42   Resp: (!) 23 (!) 22 (!) 22   Temp:   (!) 97.5 F (36.4 C)   TempSrc:   Oral   SpO2: 96% 92% 96%   Weight:    80.2 kg  Height:       Weight change: 0 kg  Intake/Output Summary (Last 24 hours) at 03/12/2019 0517 Last data filed at 03/21/2019 0400 Gross per 24 hour  Intake 1797.86 ml  Output 100 ml  Net 1697.86 ml    Labs: Basic Metabolic Panel: Recent Labs  Lab 03/02/19 0506 03/02/19 2339 03/03/19 0535 03/03/19 1919 03/12/2019 0218  NA 142  --  136  --  135  K 4.0  --  3.5  --  4.1  CL 101  --  94*  --  95*  CO2 21*  --  26  --  24  GLUCOSE 107*  --  168*  --  130*   BUN 55*  --  42*  --  63*  CREATININE 7.17*  --  4.97*  --  5.73*  CALCIUM 8.8*  --  8.5*  --  8.5*  PHOS  --  5.2* 5.2* 5.2*  --    Liver Function Tests: Recent Labs  Lab 02/28/19 0250 03/01/19 0526  AST 28 23  ALT 21 21  ALKPHOS 67 68  BILITOT 1.7* 1.3*  PROT 4.8* 5.1*  ALBUMIN 2.3* 2.2*    CBC: Recent Labs  Lab 02/28/19 0250 03/01/19 0526 03/02/19 0506 03/03/19 0535 03/28/2019 0218  WBC 8.0 8.7 9.8 6.6 7.5  HGB 9.5* 10.1* 10.0* 9.9* 9.7*  HCT 28.4* 29.8* 30.0* 28.5* 26.9*  MCV 90.7 90.3 90.9 86.9 85.9  PLT 84* 82* 100* 72* 85*   Cardiac Enzymes: No results for input(s): CKTOTAL, CKMB, CKMBINDEX, TROPONINI in the last 168 hours. CBG: Recent Labs  Lab 03/03/19 1125 03/03/19 1507 03/03/19 1924 03/03/19 2359 02/26/2019 0343  GLUCAP 194* 142* 177* 239* 112*    Iron Studies: No results for input(s): IRON, TIBC, TRANSFERRIN, FERRITIN in the  last 72 hours. Studies/Results: No results found.  Medications: Infusions: . sodium chloride    . sodium chloride Stopped (02/27/19 1901)  . amiodarone 30 mg/hr (03/21/2019 0400)  . diltiazem (CARDIZEM) infusion 12.5 mg/hr (03/06/2019 0400)  . feeding supplement (OSMOLITE 1.5 CAL) 1,000 mL (03/03/19 1451)    Scheduled Medications: . aspirin EC  325 mg Oral Daily   Or  . aspirin  324 mg Per Tube Daily  . Chlorhexidine Gluconate Cloth  6 each Topical Q0600  . Chlorhexidine Gluconate Cloth  6 each Topical Q0600  . darbepoetin (ARANESP) injection - DIALYSIS  60 mcg Intravenous Q Tue-HD  . enoxaparin (LOVENOX) injection  30 mg Subcutaneous Q48H  . feeding supplement (PRO-STAT SUGAR FREE 64)  30 mL Per Tube BID  . insulin aspart  0-24 Units Subcutaneous Q4H  . levothyroxine  50 mcg Oral Q0600  . mouth rinse  15 mL Mouth Rinse BID  . pantoprazole (PROTONIX) IV  40 mg Intravenous Daily  . rosuvastatin  40 mg Oral q1800  . sodium chloride flush  10-40 mL Intracatheter Q12H  . sodium chloride flush  3 mL Intravenous Q12H     have reviewed scheduled and prn medications.  Physical Exam: General: Lying in bed in NAD  Heart:s1s2 irregular Lungs:clear but reduced bilaterally; on 4 liter oxygen at 98% Abdomen:soft, Non-tender, non-distended Extremities: LE elevated with trace to 1+ edema thighs and none lower legs Neuro states name but that we are at dialysis; no agitation Dialysis Access: Left upper extremity AV fistula has bruit and thrill. Loosening armband   Claudia Desanctis 03/18/2019,5:17 AM

## 2019-03-04 NOTE — Progress Notes (Signed)
  Speech Language Pathology Treatment: Dysphagia  Patient Details Name: Robert [name]. MRN: [id number] DOB: Jul 13, 1935 Today's Date: 03/06/2019 Time: 4360-6770 SLP Time Calculation (min) (ACUTE ONLY): 11 min  Assessment / Plan / Recommendation Clinical Impression  Pt sitting in chair in soft mitt restraints; confusion persists.  Cortrak remains in place; TF stopped.  Tongue remains coated despite frequent oral care. Pt willing to accept limited sips of water and bites of pudding with ongoing inattention, some anterior spillage, likely delays in swallow but no overt s/s of aspiration; verbal cues needed to attend to POs. Max assist for self-feeding. Slow progress.  Pt will benefit from MBS next date if appropriate. D/W RN - continue full liquids with 1:1 assistance pending study.    HPI HPI: [name] has PMH: stroke, 2018, GERD, esophageal dilation 03/18/2010, ESRD, DM, MI, CAD. The patient presented with generalized weakness and tachycardia. Underwent CABG x 4 12/30, intubawted 12/30-12/31. CXR significant for cardiomegaly with no acute changes. New onset A-fib. MBS 2018 following stroke recommending puree, thin (vocal cord penetration).        SLP Plan  Continue with current plan of care       Recommendations  Diet recommendations: Other(comment)(full liquids) Liquids provided via: Cup;Straw Medication Administration: Via alternative means Supervision: Full supervision/cueing for compensatory strategies Compensations: Minimize environmental distractions Postural Changes and/or Swallow Maneuvers: Seated upright 90 degrees                Oral Care Recommendations: Oral care BID SLP Visit Diagnosis: Dysphagia, unspecified (R13.10) Plan: Continue with current plan of care       GO               Robert [name] Robert [name], [place] CCC/SLP Acute Rehabilitation Services Office number [phone] Pager [phone]  Robert [name] 03/08/2019, 10:30 AM

## 2019-03-05 ENCOUNTER — Inpatient Hospital Stay: Payer: Self-pay

## 2019-03-05 ENCOUNTER — Inpatient Hospital Stay (HOSPITAL_COMMUNITY): Payer: Medicare Other

## 2019-03-05 LAB — BASIC METABOLIC PANEL
Anion gap: 17 — ABNORMAL HIGH (ref 5–15)
BUN: 82 mg/dL — ABNORMAL HIGH (ref 8–23)
CO2: 24 mmol/L (ref 22–32)
Calcium: 8.2 mg/dL — ABNORMAL LOW (ref 8.9–10.3)
Chloride: 93 mmol/L — ABNORMAL LOW (ref 98–111)
Creatinine, Ser: 6.93 mg/dL — ABNORMAL HIGH (ref 0.61–1.24)
GFR calc Af Amer: 8 mL/min — ABNORMAL LOW (ref >60)
GFR calc non Af Amer: 7 mL/min — ABNORMAL LOW (ref >60)
Glucose, Bld: 239 mg/dL — ABNORMAL HIGH (ref 70–99)
Potassium: 4.1 mmol/L (ref 3.5–5.1)
Sodium: 134 mmol/L — ABNORMAL LOW (ref 135–145)

## 2019-03-05 LAB — HEPATIC FUNCTION PANEL
ALT: 47 U/L — ABNORMAL HIGH (ref 0–44)
AST: 57 U/L — ABNORMAL HIGH (ref 15–41)
Albumin: 2 g/dL — ABNORMAL LOW (ref 3.5–5.0)
Alkaline Phosphatase: 75 U/L (ref 38–126)
Bilirubin, Direct: 0.2 mg/dL (ref 0.0–0.2)
Indirect Bilirubin: 0 mg/dL — ABNORMAL LOW (ref 0.3–0.9)
Total Bilirubin: 0.2 mg/dL — ABNORMAL LOW (ref 0.3–1.2)
Total Protein: 4.6 g/dL — ABNORMAL LOW (ref 6.5–8.1)

## 2019-03-05 LAB — CBC
HCT: 27.3 % — ABNORMAL LOW (ref 39.0–52.0)
Hemoglobin: 9.3 g/dL — ABNORMAL LOW (ref 13.0–17.0)
MCH: 29.8 pg (ref 26.0–34.0)
MCHC: 34.1 g/dL (ref 30.0–36.0)
MCV: 87.5 fL (ref 80.0–100.0)
Platelets: 71 10*3/uL — ABNORMAL LOW (ref 150–400)
RBC: 3.12 MIL/uL — ABNORMAL LOW (ref 4.22–5.81)
RDW: 19.5 % — ABNORMAL HIGH (ref 11.5–15.5)
WBC: 8 10*3/uL (ref 4.0–10.5)
nRBC: 0 % (ref 0.0–0.2)

## 2019-03-05 LAB — GLUCOSE, CAPILLARY
Glucose-Capillary: 111 mg/dL — ABNORMAL HIGH (ref 70–99)
Glucose-Capillary: 182 mg/dL — ABNORMAL HIGH (ref 70–99)
Glucose-Capillary: 200 mg/dL — ABNORMAL HIGH (ref 70–99)
Glucose-Capillary: 206 mg/dL — ABNORMAL HIGH (ref 70–99)
Glucose-Capillary: 213 mg/dL — ABNORMAL HIGH (ref 70–99)
Glucose-Capillary: 244 mg/dL — ABNORMAL HIGH (ref 70–99)

## 2019-03-05 LAB — PHOSPHORUS: Phosphorus: 5.1 mg/dL — ABNORMAL HIGH (ref 2.5–4.6)

## 2019-03-05 MED ORDER — LIDOCAINE HCL (PF) 1 % IJ SOLN
5.0000 mL | INTRAMUSCULAR | Status: DC | PRN
  Filled 2019-03-05: qty 5

## 2019-03-05 MED ORDER — SODIUM CHLORIDE 0.9 % IV SOLN: 100.0000 mL | INTRAVENOUS | Status: DC | PRN

## 2019-03-05 MED ORDER — PENTAFLUOROPROP-TETRAFLUOROETH EX AERO: 1.0000 "application " | INHALATION_SPRAY | CUTANEOUS | Status: DC | PRN

## 2019-03-05 MED ORDER — METOPROLOL TARTRATE 25 MG PO TABS
25.0000 mg | ORAL_TABLET | Freq: Two times a day (BID) | ORAL | Status: DC
  Administered 2019-03-05: 15:00:00 25 mg via ORAL
  Filled 2019-03-05: qty 1

## 2019-03-05 MED ORDER — CHLORHEXIDINE GLUCONATE CLOTH 2 % EX PADS: 6.0000 | MEDICATED_PAD | Freq: Every day | CUTANEOUS | Status: DC

## 2019-03-05 MED ORDER — ALTEPLASE 2 MG IJ SOLR: 2.0000 mg | Freq: Once | INTRAMUSCULAR | Status: DC | PRN

## 2019-03-05 MED ORDER — AMIODARONE HCL 200 MG PO TABS
400.0000 mg | ORAL_TABLET | Freq: Two times a day (BID) | ORAL | Status: DC
  Administered 2019-03-05 – 2019-03-08 (×7): 400 mg via ORAL
  Filled 2019-03-05 (×7): qty 2

## 2019-03-05 MED ORDER — METOPROLOL TARTRATE 25 MG PO TABS
37.5000 mg | ORAL_TABLET | Freq: Two times a day (BID) | ORAL | Status: DC
  Administered 2019-03-05: 23:00:00 37.5 mg via ORAL
  Filled 2019-03-05: qty 1

## 2019-03-05 MED ORDER — AMIODARONE IV BOLUS ONLY 150 MG/100ML
150.0000 mg | Freq: Once | INTRAVENOUS | Status: AC
  Administered 2019-03-05: 150 mg via INTRAVENOUS

## 2019-03-05 MED ORDER — AMIODARONE IV BOLUS ONLY 150 MG/100ML: 150.0000 mg | Freq: Once | INTRAVENOUS | Status: AC

## 2019-03-05 MED ORDER — HEPARIN SODIUM (PORCINE) 1000 UNIT/ML DIALYSIS: 1000.0000 [IU] | INTRAMUSCULAR | Status: DC | PRN

## 2019-03-05 MED ORDER — LIDOCAINE-PRILOCAINE 2.5-2.5 % EX CREA: 1.0000 "application " | TOPICAL_CREAM | CUTANEOUS | Status: DC | PRN

## 2019-03-05 MED FILL — Calcium Chloride Inj 10%: INTRAVENOUS | Qty: 10 | Status: AC

## 2019-03-05 MED FILL — Heparin Sodium (Porcine) Inj 1000 Unit/ML: INTRAMUSCULAR | Qty: 10 | Status: AC

## 2019-03-05 MED FILL — Sodium Bicarbonate IV Soln 8.4%: INTRAVENOUS | Qty: 100 | Status: AC

## 2019-03-05 MED FILL — Electrolyte-R (PH 7.4) Solution: INTRAVENOUS | Qty: 1000 | Status: AC

## 2019-03-05 MED FILL — Lidocaine HCl Local Preservative Free (PF) Inj 2%: INTRAMUSCULAR | Qty: 15 | Status: AC

## 2019-03-05 MED FILL — Mannitol IV Soln 20%: INTRAVENOUS | Qty: 500 | Status: AC

## 2019-03-05 MED FILL — Albumin, Human Inj 5%: INTRAVENOUS | Qty: 250 | Status: AC

## 2019-03-05 MED FILL — Sodium Chloride IV Soln 0.9%: INTRAVENOUS | Qty: 8000 | Status: AC

## 2019-03-05 MED FILL — Potassium Chloride Inj 2 mEq/ML: INTRAVENOUS | Qty: 20 | Status: AC

## 2019-03-05 NOTE — Progress Notes (Signed)
SLP Cancellation Note  Patient Details Name: Robert [name]. MRN: [id number] DOB: 11/18/1935   Cancelled treatment:       Reason Eval/Treat Not Completed: Other (Pt receiving hemodialysis, will attempt MBSS next date)   [place] MA, [place]  03/05/2019, 11:50 AM

## 2019-03-05 NOTE — Progress Notes (Signed)
Pt on HD in 2H02 at this time. Pt developed respiratory distress with accessory muscle use and audible wheezing. Pt given an albuterol nebulizer tx and Dr. Kipp Brood called. D/C transfer order to [facility]. RN will continue to monitor.

## 2019-03-05 NOTE — Evaluation (Signed)
Physical Therapy Evaluation Patient Details Name: Robert [name]. MRN: [id number] DOB: [birth year] Today's Date: 03/05/2019   History of Present Illness  83 y.o. male with a hx of ESRD on HD, HTN, DM2, CAD s/p DES x 3, stroke x2 (2017 and 2018) s/p loop recorder, carotid artery disease s/p right CEA 2018 who is being seen today for the evaluation of new onset afib. Pt underwent CABG x 4 on 12/30 and clipping of atrial appendage.  Clinical Impression  Pt presents to PT with deficits in cognition, gait, balance, endurance, strength, power, functional mobility, safety awareness. Pt with significant AMS, although baseline cognition and mobility status is unclear as no family present during session. Pt requires physical assistance for all functional mobility at this time due to weakness and safety awareness deficits. Pt will benefit from PT POC to improve functional mobility and reduce falls risk. Clarification of PLOF needed.    Follow Up Recommendations CIR;Supervision/Assistance - 24 hour    Equipment Recommendations  (defer to post-acute setting)    Recommendations for Other Services Rehab consult     Precautions / Restrictions Precautions Precautions: Fall Restrictions Weight Bearing Restrictions: No Other Position/Activity Restrictions: sternal precautions      Mobility  Bed Mobility Overal bed mobility: Needs Assistance Bed Mobility: Rolling;Supine to Sit;Sit to Supine Rolling: Max assist   Supine to sit: Max assist;HOB elevated Sit to supine: Total assist      Transfers Overall transfer level: Needs assistance Equipment used: 1 person hand held assist Transfers: Sit to/from Stand Sit to Stand: Mod assist         General transfer comment: PT having to manually facilitate hip flexion to sit from standing  Ambulation/Gait Ambulation/Gait assistance: (pt unable to take step despite PT cues)              Stairs            Wheelchair Mobility     Modified Rankin (Stroke Patients Only)       Balance Overall balance assessment: Needs assistance Sitting-balance support: Bilateral upper extremity supported;Feet supported Sitting balance-Leahy Scale: Fair Sitting balance - Comments: minG-minA static sitting balance   Standing balance support: Bilateral upper extremity supported Standing balance-Leahy Scale: Poor Standing balance comment: min-modA static standing balance                             Pertinent Vitals/Pain Pain Assessment: No/denies pain    Home Living Family/patient expects to be discharged to:: Private residence Living Arrangements: Children Available Help at Discharge: Family;Available PRN/intermittently             Additional Comments: pt is confused during session and unable to reliably participate in history    Prior Function           Comments: unable to reliably determine, pt reports he ambulates with a walker but PT unsure if he requires assistance     Hand Dominance   Dominant Hand: Right    Extremity/Trunk Assessment   Upper Extremity Assessment Upper Extremity Assessment: Generalized weakness    Lower Extremity Assessment Lower Extremity Assessment: Generalized weakness(grossly 4-/5 based on observation)    Cervical / Trunk Assessment Cervical / Trunk Assessment: Normal  Communication   Communication: HOH  Cognition Arousal/Alertness: Awake/alert Behavior During Therapy: Restless Overall Cognitive Status: No family/caregiver present to determine baseline cognitive functioning  General Comments: pt very confused during session, reporting he goes by [facility] although not seen in chart and RN unaware. Pt is oriented to year but not month or date, does know he is in [place]. Pt requires multiple cues to follow simple one step commands. Pt with safety awareness and precaution deficits.      General Comments General  comments (skin integrity, edema, etc.): Pt tachy in a-fib during session with max HR noted of 139 and minHR of 106 noted. Pt on room air initially due to nasal canula out of nose, PT reapplies 4L [place] for mobility and pt temporarily desats to 86% briefly during session twice, recovering quickly when resting    Exercises     Assessment/Plan    PT Assessment Patient needs continued PT services  PT Problem List Decreased strength;Decreased activity tolerance;Decreased balance;Decreased mobility;Decreased cognition;Decreased knowledge of use of DME;Decreased safety awareness;Decreased knowledge of precautions;Cardiopulmonary status limiting activity       PT Treatment Interventions DME instruction;Gait training;Stair training;Functional mobility training;Therapeutic activities;Therapeutic exercise;Balance training;Neuromuscular re-education;Patient/family education    PT Goals (Current goals can be found in the Care Plan section)  Acute Rehab PT Goals Patient Stated Goal: to move better PT Goal Formulation: With patient Time For Goal Achievement: 03/19/19 Potential to Achieve Goals: Fair    Frequency Min 3X/week   Barriers to discharge        Co-evaluation               AM-PAC PT "6 Clicks" Mobility  Outcome Measure Help needed turning from your back to your side while in a flat bed without using bedrails?: Total Help needed moving from lying on your back to sitting on the side of a flat bed without using bedrails?: Total Help needed moving to and from a bed to a chair (including a wheelchair)?: A Lot Help needed standing up from a chair using your arms (e.g., wheelchair or bedside chair)?: A Lot Help needed to walk in hospital room?: A Lot Help needed climbing 3-5 steps with a railing? : Total 6 Click Score: 9    End of Session Equipment Utilized During Treatment: Oxygen Activity Tolerance: Patient tolerated treatment well Patient left: in bed;with call bell/phone within  reach;with bed alarm set;with nursing/sitter in room Nurse Communication: Mobility status PT Visit Diagnosis: Unsteadiness on feet (R26.81);Muscle weakness (generalized) (M62.81)    Time: 0165-5374 PT Time Calculation (min) (ACUTE ONLY): 17 min   Charges:   PT Evaluation $PT Eval High Complexity: 1 High          Zenaida Niece, PT, DPT Acute Rehabilitation Pager: [phone]   Zenaida Niece 03/05/2019, 4:20 PM

## 2019-03-05 NOTE — Progress Notes (Signed)
2L removed from HD today 03/04/2018

## 2019-03-05 NOTE — Progress Notes (Signed)
[facility] NEPHROLOGY PROGRESS NOTE  Assessment/ Plan: Pt is a 84 y.o. yo male with HTN, CAD, stroke, DM, admitted with an NSTEMI and found to have three-vessel CAD status post CABG on 12/30.  Dialysis: [place] 4h   400/800    73.5kg    3/2.25Ca bath  P4  Hep none L AVF Hectorol 4 Venofer 50 Mircera 75 (last 12/15)  #CAD/NSTEMI: LHC with severe multivessel CAD status post CABG.  Cardiology, CT surgery following.  # ESRD, HD per TTS schedule normally.  HD on 1/8 off schedule due to pt volumes then resume TTS schedule  # Anemia due to CKD: Received PRBC postop.  Aranesp weekly on Tuesdays - note was started 1/5 here   # Secondary hyperparathyroidism: Started hectorol.  Resumed binders at lower dose with variable intake.  Phosphorus level at goal.  # HTN/volume: Blood pressure acceptable.  #Paroxysmal A. fib: On IV amiodarone and dilt  Anticoagulation per primary team  #AMS, agitation: improved    Subjective:  Last HD on 1/5 with 1.7 L UF.  Treatment postponed due to inpt volumes.  dilt was added for rate control o/n per nursing report.  Restraints removed in interim  Review of systems:  denies shortness of breath or chest pain; denies n/v  Hasn't been as confused    Objective Vital signs in last 24 hours: Vitals:   03/05/19 0200 03/05/19 0300 03/05/19 0400 03/05/19 0500  BP: (!) 163/79 (!) 131/45 134/65 (!) 111/48  Pulse:  74  (!) 134  Resp: (!) 24 18 (!) 24 (!) 21  Temp:   98.9 F (37.2 C)   TempSrc:   Oral   SpO2: 97% 94% 98% 98%  Weight:      Height:       Weight change:   Intake/Output Summary (Last 24 hours) at 03/05/2019 0520 Last data filed at 03/05/2019 0500 Gross per 24 hour  Intake 760.28 ml  Output -  Net 760.28 ml    Labs: Basic Metabolic Panel: Recent Labs  Lab 03/02/19 2339 03/03/19 0535 03/03/19 1919 03/27/2019 0218 03/05/19 0442  NA  --  136  --  135 134*  K  --  3.5  --  4.1 4.1  CL  --  94*  --  95* 93*  CO2  --  26   --  24 24  GLUCOSE  --  168*  --  130* 239*  BUN  --  42*  --  63* 82*  CREATININE  --  4.97*  --  5.73* 6.93*  CALCIUM  --  8.5*  --  8.5* 8.2*  PHOS 5.2* 5.2* 5.2*  --   --    Liver Function Tests: Recent Labs  Lab 02/28/19 0250 03/01/19 0526  AST 28 23  ALT 21 21  ALKPHOS 67 68  BILITOT 1.7* 1.3*  PROT 4.8* 5.1*  ALBUMIN 2.3* 2.2*    CBC: Recent Labs  Lab 02/28/19 0250 03/01/19 0526 03/02/19 0506 03/03/19 0535 03/26/2019 0218  WBC 8.0 8.7 9.8 6.6 7.5  HGB 9.5* 10.1* 10.0* 9.9* 9.7*  HCT 28.4* 29.8* 30.0* 28.5* 26.9*  MCV 90.7 90.3 90.9 86.9 85.9  PLT 84* 82* 100* 72* 85*   Cardiac Enzymes: No results for input(s): CKTOTAL, CKMB, CKMBINDEX, TROPONINI in the last 168 hours. CBG: Recent Labs  Lab 03/23/2019 1114 03/16/2019 1528 03/12/2019 1940 03/21/2019 2328 03/05/19 0322  GLUCAP 184* 165* 150* 180* 200*    Iron Studies: No results for input(s): IRON, TIBC,  TRANSFERRIN, FERRITIN in the last 72 hours. Studies/Results: DG CHEST PORT 1 VIEW  Result Date: 03/25/2019 CLINICAL DATA:  Pleural effusion EXAM: PORTABLE CHEST 1 VIEW COMPARISON:  03/01/2019 FINDINGS: Small to moderate left pleural effusion is similar to prior study. Left lower lobe atelectasis or infiltrate. No effusion or confluent opacity on the right. Cardiomegaly. Prior CABG. IMPRESSION: Small to moderate left pleural effusion with left lower lobe atelectasis or infiltrate, similar to prior study. Cardiomegaly. Electronically Signed   By: Rolm Baptise M.D.   On: 03/19/2019 09:34    Medications: Infusions: . sodium chloride    . sodium chloride Stopped (02/27/19 1901)  . amiodarone 30 mg/hr (03/05/19 0500)  . diltiazem (CARDIZEM) infusion 7.5 mg/hr (03/05/19 0500)    Scheduled Medications: . aspirin EC  325 mg Oral Daily   Or  . aspirin  324 mg Per Tube Daily  . calcium acetate  667 mg Oral TID WC  . Chlorhexidine Gluconate Cloth  6 each Topical Q0600  . Chlorhexidine Gluconate Cloth  6 each Topical  Q0600  . darbepoetin (ARANESP) injection - DIALYSIS  60 mcg Intravenous Q Tue-HD  . doxercalciferol  4 mcg Intravenous Q T,Th,Sa-HD  . enoxaparin (LOVENOX) injection  30 mg Subcutaneous Q48H  . feeding supplement (ENSURE ENLIVE)  237 mL Oral TID BM  . feeding supplement (NEPRO CARB STEADY)  1,000 mL Per Tube Q24H  . feeding supplement (PRO-STAT SUGAR FREE 64)  30 mL Per Tube TID  . insulin aspart  0-24 Units Subcutaneous Q4H  . levothyroxine  50 mcg Oral Q0600  . mouth rinse  15 mL Mouth Rinse BID  . multivitamin  1 tablet Oral QHS  . pantoprazole  40 mg Oral Daily  . rosuvastatin  40 mg Oral q1800  . sodium chloride flush  10-40 mL Intracatheter Q12H  . sodium chloride flush  3 mL Intravenous Q12H    have reviewed scheduled and prn medications.  Physical Exam: General: Lying in bed in NAD  Heart:s1s2 irregular Lungs:clear but reduced bilaterally; on 4 liter oxygen Abdomen:soft, Non-tender, non-distended Extremities: 1+ edema lower extremities Neuro states name and location of hospital; more alert Dialysis Access: Left upper extremity AV fistula has bruit and thrill.  Claudia Desanctis 03/05/2019,5:20 AM

## 2019-03-05 NOTE — Progress Notes (Signed)
Secure chat sent to Dr. Royce Macadamia, nephrologist, for approval for PICC.  Per Dr. Royce Macadamia no PICC placement, patient would need to have [place] or other access placed that is not in the arm.  Spoke with Judson Roch, RN who states that she will let Dr. Kipp Brood know the above.  Carolee Rota, RN

## 2019-03-05 NOTE — Progress Notes (Signed)
      [address]       [place] [address]             [phone]                 9 Days Post-Op Procedure(s) (LRB): CORONARY ARTERY BYPASS GRAFTING (CABG) using endoscopic greater saphenous vein harvest: SVC to Diag1; SVC to OM1; SVC to PDA and LIMA to LAD. (N/A) Clipping Of Atrial Appendage using 45 MM AtriCure AtriClip (N/A)   Events: Doing well _______________________________________________________________ Vitals: BP 138/78   Pulse 82   Temp (P) 98.2 F (36.8 C) (Oral)   Resp (!) 23   Ht 5\' 8"  (1.727 m)   Wt 83.3 kg   SpO2 100%   BMI 27.92 kg/m   - Neuro: alert, NAD  - Cardiovascular: afib rate controlled      - Pulm: EWOB on [place]    ABG    Component Value Date/Time   PHART 7.323 (L) 02/25/2019 1150   PCO2ART 32.3 02/25/2019 1150   PO2ART 61.0 (L) 02/25/2019 1150   HCO3 16.8 (L) 02/25/2019 1150   TCO2 18 (L) 02/25/2019 1150   ACIDBASEDEF 9.0 (H) 02/25/2019 1150   O2SAT 90.0 02/25/2019 1150    - Abd: soft - Extremity: warm  .Intake/Output      01/07 0701 - 01/08 0700 01/08 0701 - 01/09 0700   P.O. 60    I.V. (mL/kg) 485.3 (5.8) 108.4 (1.3)   NG/GT 650 250   Total Intake(mL/kg) 1195.3 (14.2) 358.4 (4.3)   Urine (mL/kg/hr)     Total Output     Net +1195.3 +358.4        Urine Occurrence  1 x      _______________________________________________________________ Labs: CBC Latest Ref Rng & Units 03/05/2019 03/28/2019 03/03/2019  WBC 4.0 - 10.5 K/uL 8.0 7.5 6.6  Hemoglobin 13.0 - 17.0 g/dL 9.3(L) 9.7(L) 9.9(L)  Hematocrit 39.0 - 52.0 % 27.3(L) 26.9(L) 28.5(L)  Platelets 150 - 400 K/uL 71(L) 85(L) 72(L)   CMP Latest Ref Rng & Units 03/05/2019 03/22/2019 03/03/2019  Glucose 70 - 99 mg/dL 239(H) 130(H) 168(H)  BUN 8 - 23 mg/dL 82(H) 63(H) 42(H)  Creatinine 0.61 - 1.24 mg/dL 6.93(H) 5.73(H) 4.97(H)  Sodium 135 - 145 mmol/L 134(L) 135 136  Potassium 3.5 - 5.1 mmol/L 4.1 4.1 3.5  Chloride 98 - 111 mmol/L 93(L) 95(L) 94(L)  CO2 22 - 32 mmol/L  24 24 26   Calcium 8.9 - 10.3 mg/dL 8.2(L) 8.5(L) 8.5(L)  Total Protein 6.5 - 8.1 g/dL - - -  Total Bilirubin 0.3 - 1.2 mg/dL - - -  Alkaline Phos 38 - 126 U/L - - -  AST 15 - 41 U/L - - -  ALT 0 - 44 U/L - - -    CXR: stable  _______________________________________________________________  Assessment and Plan: POD 9 s/p CABG [place].     Neuro: much improved. Close to baseline CV: afib, rate improved no anticoagulation for now.  Pulm: remains on [place].  Stable pleural effusion Renal: dialysis today GI: tubefeeds will be held.  Will encourage PO intake Heme: stable ID: afebrile, no leukocytosis Endo: SSI Dispo: stepdown today  Melodie Bouillon, MD 03/05/2019 12:55 PM

## 2019-03-05 NOTE — Progress Notes (Signed)
Rehab Admissions Coordinator Note:  Patient was screened by Michel Santee for appropriateness for an Inpatient Acute Rehab Consult.  At this time, we are recommending Inpatient Rehab consult.  Please place an order if pt would like to be considered.   Michel Santee 03/05/2019, 4:41 PM  I can be reached at [phone].

## 2019-03-05 NOTE — TOC Initial Note (Signed)
Transition of Care (TOC) - Initial/Assessment Note    Patient Details  Name: Robert [name]. MRN: [id number] Date of Birth: [birth year]  Transition of Care [facility]) CM/SW Contact:    Bethena Roys, RN Phone Number: 03/05/2019, 3:57 PM  Clinical Narrative: Patient presented for weakness and atrial fib. Prior to arrival from home with wife-that has dementia. Patient is the primary caregiver for his wife. Patient has support of his son and daughter. Case Manager was able to speak with daughter Mickel Baas. Patient's wife has personal care services 4 days a week due to patient has Hemodialysis TTS schedule. Personal Care Services are with the wife 4 hours on those days and 4 hours on Saturday. Patient was driving himself prior hemodialysis prior to hospitalization. Patient has Primary care provider and [facility] [address] and [facility] for Maintenance medications-90 day supply. Daughter expressed that patient was having difficulty swallowing Omega 3 and Calcium Acetate due to size. Daughter wanted to see if any alternatives. Case manager will continue to follow for transition of care needs as the patient progresses.                  Expected Discharge Plan: [place] Barriers to Discharge: Continued Medical Work up(New HD and Cortrak placed 03-02-19-admitted with an NSTEMI and found to have three-vessel CAD status post CABG on 12/30. 7 days post op-)    Expected Discharge Plan and Services Expected Discharge Plan: [place]   Discharge Planning Services: CM Consult   Living arrangements for the past 2 months: Single Family Home     Prior Living Arrangements/Services Living arrangements for the past 2 months: Single Family Home Lives with:: Spouse(wife has dementia- patient is caregiver.) Patient language and need for interpreter reviewed:: Yes          Care giver support system in place?: Yes (comment)   Criminal Activity/Legal Involvement  Pertinent to Current Situation/Hospitalization: No - Comment as needed  Activities of Daily Living Home Assistive Devices/Equipment: Eyeglasses, Hearing aid ADL Screening (condition at time of admission) Patient's cognitive ability adequate to safely complete daily activities?: Yes Is the patient deaf or have difficulty hearing?: Yes Does the patient have difficulty seeing, even when wearing glasses/contacts?: No Does the patient have difficulty concentrating, remembering, or making decisions?: No Patient able to express need for assistance with ADLs?: Yes Does the patient have difficulty dressing or bathing?: No Independently performs ADLs?: Yes (appropriate for developmental age) Does the patient have difficulty walking or climbing stairs?: No Weakness of Legs: None Weakness of Arms/Hands: None  Emotional Assessment Appearance:: Appears stated age Attitude/Demeanor/Rapport: Unable to Assess Affect (typically observed): Unable to Assess   Alcohol / Substance Use: Not Applicable Psych Involvement: No (comment)  Admission diagnosis:  New onset a-fib ([place]) [I48.91] Elevated troponin [R77.8] Atrial fibrillation with RVR (HCC) [I48.91] NSTEMI (non-ST elevated myocardial infarction) ([place]) [I21.4] Coronary artery disease [I25.10] Patient Active Problem List   Diagnosis Date Noted  . Malnutrition of moderate degree 03/06/2019  . S/P CABG x 4 02/25/2019  . Coronary artery disease 02/19/2019  . NSTEMI (non-ST elevated myocardial infarction) ([place]) 01/27/2019  . Atrial fibrillation ([place]) 02/03/2019  . Atrial fibrillation with RVR ([place]) 02/17/2019  . CAD (coronary artery disease) 12/26/2017  . ESRD on dialysis ([place]) 12/22/2017  . Carotid stenosis, right 05/21/2016  . AKI (acute kidney injury) ([place]) 05/20/2016  . Dysarthria 05/20/2016  . Thrombocytopenia ([place]) 05/20/2016  . Chronic diastolic CHF (congestive  heart failure) ([place]) 05/20/2016  . Slurred speech   . CKD (chronic kidney  disease) stage V requiring chronic dialysis ([place]) 03/20/2016  . Stroke (cerebrum) ([place])   . History of stroke 05/13/2015  . Acute encephalopathy 05/13/2015  . Hypertensive urgency 05/13/2015  . Hyperlipidemia 05/13/2015  . Diabetes mellitus, type II, insulin dependent ([place]) 08/18/2009  . Anemia 08/18/2009  . Hypertensive heart disease with heart failure ([place]) 08/18/2009  . ALLERGIC RHINITIS, SEASONAL 08/18/2009  . PARAPHARYNGEAL ABSCESS 08/18/2009  . GERD 08/18/2009  . HIATAL HERNIA 08/18/2009  . DIVERTICULOSIS, COLON 08/18/2009  . 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 ARTERIOVENOUS MALFORMATION, COLON 08/18/2009  . DYSPHAGIA 08/18/2009  . COLONIC POLYPS, HX OF 08/18/2009  . RENAL CALCULUS, HX OF 08/18/2009  . ESOPHAGEAL STRICTURE 06/21/2009   PCP:  Burnard Bunting, MD Pharmacy:   [id number] [place] [place] [address] Phone: [phone] Fax: [phone]  [place], [place] [facility] [address] [place] [address] [place] [address] Phone: [phone] Fax: [phone]     Social Determinants of Health ([place]) Interventions    Readmission Risk Interventions Readmission Risk Prevention Plan 03/05/2019  Transportation Screening Complete  Medication Review (RN Care Manager) Complete  HRI or Home Care Consult Complete  SW Recovery Care/Counseling Consult Complete  Some recent data might be hidden

## 2019-03-06 ENCOUNTER — Inpatient Hospital Stay (HOSPITAL_COMMUNITY): Payer: Medicare Other

## 2019-03-06 LAB — RENAL FUNCTION PANEL
Albumin: 1.9 g/dL — ABNORMAL LOW (ref 3.5–5.0)
Anion gap: 17 — ABNORMAL HIGH (ref 5–15)
BUN: 79 mg/dL — ABNORMAL HIGH (ref 8–23)
CO2: 25 mmol/L (ref 22–32)
Calcium: 7.9 mg/dL — ABNORMAL LOW (ref 8.9–10.3)
Chloride: 90 mmol/L — ABNORMAL LOW (ref 98–111)
Creatinine, Ser: 5.89 mg/dL — ABNORMAL HIGH (ref 0.61–1.24)
GFR calc Af Amer: 10 mL/min — ABNORMAL LOW (ref >60)
GFR calc non Af Amer: 8 mL/min — ABNORMAL LOW (ref >60)
Glucose, Bld: 229 mg/dL — ABNORMAL HIGH (ref 70–99)
Phosphorus: 4.2 mg/dL (ref 2.5–4.6)
Potassium: 4.5 mmol/L (ref 3.5–5.1)
Sodium: 132 mmol/L — ABNORMAL LOW (ref 135–145)

## 2019-03-06 LAB — GLUCOSE, CAPILLARY
Glucose-Capillary: 102 mg/dL — ABNORMAL HIGH (ref 70–99)
Glucose-Capillary: 152 mg/dL — ABNORMAL HIGH (ref 70–99)
Glucose-Capillary: 153 mg/dL — ABNORMAL HIGH (ref 70–99)
Glucose-Capillary: 173 mg/dL — ABNORMAL HIGH (ref 70–99)
Glucose-Capillary: 176 mg/dL — ABNORMAL HIGH (ref 70–99)
Glucose-Capillary: 192 mg/dL — ABNORMAL HIGH (ref 70–99)

## 2019-03-06 MED ORDER — MIDODRINE HCL 5 MG PO TABS
10.0000 mg | ORAL_TABLET | Freq: Three times a day (TID) | ORAL | Status: DC
  Administered 2019-03-06 – 2019-03-09 (×11): 10 mg via ORAL
  Filled 2019-03-06 (×12): qty 2

## 2019-03-06 MED ORDER — METOPROLOL TARTRATE 50 MG PO TABS: 50.0000 mg | ORAL_TABLET | Freq: Two times a day (BID) | ORAL | Status: DC

## 2019-03-06 MED ORDER — NOREPINEPHRINE 4 MG/250ML-% IV SOLN
0.0000 ug/min | INTRAVENOUS | Status: DC
  Administered 2019-03-07: 02:00:00 2 ug/min via INTRAVENOUS
  Filled 2019-03-06 (×2): qty 250

## 2019-03-06 MED ORDER — METOPROLOL TARTRATE 25 MG PO TABS
25.0000 mg | ORAL_TABLET | Freq: Two times a day (BID) | ORAL | Status: DC
  Administered 2019-03-06 – 2019-03-08 (×2): 25 mg via ORAL
  Filled 2019-03-06 (×3): qty 1

## 2019-03-06 MED ORDER — CHLORHEXIDINE GLUCONATE CLOTH 2 % EX PADS
6.0000 | MEDICATED_PAD | Freq: Every day | CUTANEOUS | Status: DC
  Administered 2019-03-06 – 2019-03-08 (×2): 6 via TOPICAL

## 2019-03-06 NOTE — Progress Notes (Addendum)
[facility] NEPHROLOGY PROGRESS NOTE  Assessment/ Plan: Pt is a 84 y.o. yo male with HTN, CAD, stroke, DM, admitted with an NSTEMI and found to have three-vessel CAD status post CABG on 12/30.  Dialysis: [place] TTS 4h   400/800    73.5kg    3/2.25Ca bath  P4  Hep none L AVF Hectorol 4 Venofer 50 Mircera 75 (last 12/15)  #CAD/NSTEMI: LHC with severe multivessel CAD status post CABG.  Cardiology, CT surgery following.  # ESRD, HD per TTS schedule normally.  Had HD on 1/8 off schedule due to pt volumes then plan to have a goal to resume TTS schedule.   - HD today. Discussed with CHF.  Added midodrine to support UF - Ordered daily labs  - please use alternative to morphine for pain given pt ESRD - will predispose to AMS  # Acute hypoxic resp failure - optimize volume with HD - s/p neb per nursing report  - reassess after HD - may not be exclusively 2/2 fluid though weight is up  # Anemia due to CKD: Received PRBC postop.  Aranesp weekly on Tuesdays - note was started 1/5 here   # Secondary hyperparathyroidism: Started hectorol.  Resumed binders at lower dose with variable intake.  Phosphorus level at goal.  # HTN/volume: Blood pressure acceptable/low on regimen for his afib.  Added midodrine to support UF  #Paroxysmal A. fib: On amiodarone.  Anticoagulation per primary team  #AMS, agitation:please avoid morphine given ESRD patient - will exacerbate AMS    Subjective:  Last HD on 1/8 off schedule with 2 kg UF.  He had intermittent shortness of breath even after HD yesterday and got a breathing treatment.   Review of systems:   Reports intermittent shortness of breath or chest pain denies n/v  Changed room and sitting in chair     Objective Vital signs in last 24 hours: Vitals:   03/06/19 0300 03/06/19 0334 03/06/19 0400 03/06/19 0500  BP: (!) 90/53  (!) 128/57 103/71  Pulse: (!) 119  (!) 120 (!) 124  Resp: (!) 23  (!) 25 (!) 24  Temp:  98.3 F (36.8 C)     TempSrc:  Oral    SpO2: 92%  94% 99%  Weight:    78.8 kg  Height:       Weight change: -1 kg  Intake/Output Summary (Last 24 hours) at 03/06/2019 0638 Last data filed at 03/06/2019 0500 Gross per 24 hour  Intake 1940.34 ml  Output 2000 ml  Net -59.66 ml    Labs: Basic Metabolic Panel: Recent Labs  Lab 03/03/19 0535 03/03/19 1919 03/26/2019 0218 03/05/19 0442 03/05/19 1300  NA 136  --  135 134*  --   K 3.5  --  4.1 4.1  --   CL 94*  --  95* 93*  --   CO2 26  --  24 24  --   GLUCOSE 168*  --  130* 239*  --   BUN 42*  --  63* 82*  --   CREATININE 4.97*  --  5.73* 6.93*  --   CALCIUM 8.5*  --  8.5* 8.2*  --   PHOS 5.2* 5.2*  --   --  5.1*   Liver Function Tests: Recent Labs  Lab 02/28/19 0250 03/01/19 0526 03/05/19 1300  AST 28 23 57*  ALT 21 21 47*  ALKPHOS 67 68 75  BILITOT 1.7* 1.3* 0.2*  PROT 4.8* 5.1* 4.6*  ALBUMIN 2.3* 2.2*  2.0*    CBC: Recent Labs  Lab 03/01/19 0526 03/02/19 0506 03/03/19 0535 03/24/2019 0218 03/05/19 0442  WBC 8.7 9.8 6.6 7.5 8.0  HGB 10.1* 10.0* 9.9* 9.7* 9.3*  HCT 29.8* 30.0* 28.5* 26.9* 27.3*  MCV 90.3 90.9 86.9 85.9 87.5  PLT 82* 100* 72* 85* 71*   Cardiac Enzymes: No results for input(s): CKTOTAL, CKMB, CKMBINDEX, TROPONINI in the last 168 hours. CBG: Recent Labs  Lab 03/05/19 1127 03/05/19 1454 03/05/19 1955 03/05/19 2330 03/06/19 0332  GLUCAP 206* 111* 244* 182* 192*    Iron Studies: No results for input(s): IRON, TIBC, TRANSFERRIN, FERRITIN in the last 72 hours. Studies/Results: DG CHEST PORT 1 VIEW  Result Date: 02/28/2019 CLINICAL DATA:  Pleural effusion EXAM: PORTABLE CHEST 1 VIEW COMPARISON:  03/01/2019 FINDINGS: Small to moderate left pleural effusion is similar to prior study. Left lower lobe atelectasis or infiltrate. No effusion or confluent opacity on the right. Cardiomegaly. Prior CABG. IMPRESSION: Small to moderate left pleural effusion with left lower lobe atelectasis or infiltrate, similar to prior  study. Cardiomegaly. Electronically Signed   By: Rolm Baptise M.D.   On: 03/22/2019 09:34   [place] EKG SITE RITE  Result Date: 03/05/2019 If Site Rite image not attached, placement could not be confirmed due to current cardiac rhythm.   Medications: Infusions: . sodium chloride    . sodium chloride Stopped (02/27/19 1901)  . sodium chloride    . sodium chloride      Scheduled Medications: . amiodarone  400 mg Oral BID  . aspirin EC  325 mg Oral Daily   Or  . aspirin  324 mg Per Tube Daily  . calcium acetate  667 mg Oral TID WC  . Chlorhexidine Gluconate Cloth  6 each Topical Q0600  . Chlorhexidine Gluconate Cloth  6 each Topical Q0600  . darbepoetin (ARANESP) injection - DIALYSIS  60 mcg Intravenous Q Tue-HD  . doxercalciferol  4 mcg Intravenous Q T,Th,Sa-HD  . enoxaparin (LOVENOX) injection  30 mg Subcutaneous Q48H  . feeding supplement (ENSURE ENLIVE)  237 mL Oral TID BM  . feeding supplement (NEPRO CARB STEADY)  1,000 mL Per Tube Q24H  . feeding supplement (PRO-STAT SUGAR FREE 64)  30 mL Per Tube TID  . insulin aspart  0-24 Units Subcutaneous Q4H  . levothyroxine  50 mcg Oral Q0600  . mouth rinse  15 mL Mouth Rinse BID  . metoprolol tartrate  37.5 mg Oral BID  . multivitamin  1 tablet Oral QHS  . pantoprazole  40 mg Oral Daily  . rosuvastatin  40 mg Oral q1800  . sodium chloride flush  10-40 mL Intracatheter Q12H  . sodium chloride flush  3 mL Intravenous Q12H    have reviewed scheduled and prn medications.  Physical Exam: General: Lying in bed in NAD but seems more agitated  Heart:s1s2 tachy irregular Lungs:reduced bilaterally; on 6 liter oxygen and 98% Abdomen:soft, Non-tender, non-distended Extremities: trace edema lower extremities Neuro talkative but confused - answers some questions Dialysis Access: Left upper extremity AV fistula has bruit and thrill.  Claudia Desanctis 03/06/2019,6:38 AM

## 2019-03-06 NOTE — Progress Notes (Signed)
Nutrition Follow-up  DOCUMENTATION CODES:   Non-severe (moderate) malnutrition in context of chronic illness  INTERVENTION:  Resume Tube Feeding: Nocturnal x 12 hours Nepro at 50 ml/hr x 12 hours: 6pm - 6am (1800 to 0600 daily) Pro-Stat 30 mL TID Provides 94 g of protein, 1380 kcals and 438 mL of free water  Ensure Enlive po TID, each supplement provides 350 kcal and 20 grams of protein  Magic cup BID with meals, each supplement provides 290 kcal and 9 grams of protein  Rena-vit daily at bedtime  NUTRITION DIAGNOSIS:   Moderate Malnutrition related to chronic illness(ESRD/HD, CAD,) as evidenced by moderate fat depletion, moderate muscle depletion.  Being addressed via supplements, nocturnal tube feedings  GOAL:   Patient will meet greater than or equal to 90% of their needs  Progressing  MONITOR:   TF tolerance, Diet advancement, Labs, Weight trends, Skin  REASON FOR ASSESSMENT:   Consult Enteral/tube feeding initiation and management  ASSESSMENT:   84 yo admitted with NSTEMI, found to have 3 vessel CAD and underwent CABG x 4. PMH includes ESRD on HD, HTN, CAD, stroke, DM  1/5 Cortrak 1/7 Tube feeding discontinued in AM 1/7 Nocturnal TF support initiated 1/8 Tube feeding held per MD, encouraging PO intake  Patient noted confused this morning with complaints of dyspnea. Per chart review, if unable to remove enough fluid with HD today, pt will need CRRT.   Patient continues on FL diet, per notes patient has not been eating/drinking much and seems to not be interested in it. MBS unable to be completed today secondary to patient having episodes of desaturation. SLP continues to follow, plans to reassess readiness for MBS next date. RD consulted; nocturnal feedings to resume.   I/Os: +4254 ml since admit  -553 ml x 24 hrs 1/9 HD-Net UF 583 ml  Current wt 80.7 kg  EDW 73.5 kg  Medications reviewed and include: Phoslo, Protonix, Rena-vit, Midodrine, SS  novolog  Labs: CBGs 153-176 x 24 hrs, Na 132 (L), BUN 79 (H), Cr 5.89 (H), Albumin 1.9 (L), Correct Ca 9.58   Diet Order:   Diet Order            Diet full liquid Room service appropriate? Yes; Fluid consistency: Thin  Diet effective now              EDUCATION NEEDS:   Not appropriate for education at this time  Skin:  Skin Assessment: Reviewed RN Assessment  Last BM:  1/09 (type 7)  Height:   Ht Readings from Last 1 Encounters:  02/13/2019 5\' 8"  (1.727 m)    Weight:   Wt Readings from Last 1 Encounters:  03/06/19 80.7 kg    Ideal Body Weight:     BMI:  Body mass index is 27.05 kg/m.  Estimated Nutritional Needs:   Kcal:  4580-9983 kcals  Protein:  92-110 g  Fluid:  1000 mL plus UOP   Lajuan Lines, RD, LDN Clinical Nutrition [place] Telephone [phone] After Hours/Weekend Pager: [phone]

## 2019-03-06 NOTE — Progress Notes (Signed)
SLP Cancellation Note  Patient Details Name: Robert [name]. MRN: [id number] DOB: 09/16/1935   Cancelled treatment:       Reason Eval/Treat Not Completed: Medical issues which prohibited therapy. Initial plan was for MBS today, but patient's RN informed SLP that he is having random episodes of desaturation so plan is now to reassess his readiness for MBS next date. RN also stated that he has not been eating/drinking much at all of his full liquid diet and seems to not be interested in it.   Sonia Baller, MA, CCC-SLP Speech Therapy [facility] Acute Rehab Pager: [phone]

## 2019-03-06 NOTE — Progress Notes (Signed)
      [address]       ,[place] [address]             [phone]                 10 Days Post-Op Procedure(s) (LRB): CORONARY ARTERY BYPASS GRAFTING (CABG) using endoscopic greater saphenous vein harvest: SVC to Diag1; SVC to OM1; SVC to PDA and LIMA to LAD. (N/A) Clipping Of Atrial Appendage using 45 MM AtriCure AtriClip (N/A)   Events: Increased work of breathing Back in sinus _______________________________________________________________ Vitals: BP (!) 94/51   Pulse (!) 116   Temp 100 F (37.8 C) (Oral)   Resp (!) 28   Ht 5\' 8"  (1.727 m)   Wt 78.8 kg   SpO2 99%   BMI 26.41 kg/m   - Neuro: awake, follows commands  - Cardiovascular: sinus  .      - Pulm: increased WOB, B wheezes    ABG    Component Value Date/Time   PHART 7.323 (L) 02/25/2019 1150   PCO2ART 32.3 02/25/2019 1150   PO2ART 61.0 (L) 02/25/2019 1150   HCO3 16.8 (L) 02/25/2019 1150   TCO2 18 (L) 02/25/2019 1150   ACIDBASEDEF 9.0 (H) 02/25/2019 1150   O2SAT 90.0 02/25/2019 1150    - Abd: soft - Extremity: edematous  .Intake/Output      01/08 0701 - 01/09 0700 01/09 0701 - 01/10 0700   P.O. 30    I.V. (mL/kg) 177 (2.2)    NG/GT 1240 100   Total Intake(mL/kg) 1447 (18.4) 100 (1.3)   Urine (mL/kg/hr) 0 (0)    Emesis/NG output  40   Other 2000    Stool  0   Total Output 2000 40   Net -553 +60        Urine Occurrence 2 x    Stool Occurrence  2 x      _______________________________________________________________ Labs: CBC Latest Ref Rng & Units 03/05/2019 03/09/2019 03/03/2019  WBC 4.0 - 10.5 K/uL 8.0 7.5 6.6  Hemoglobin 13.0 - 17.0 g/dL 9.3(L) 9.7(L) 9.9(L)  Hematocrit 39.0 - 52.0 % 27.3(L) 26.9(L) 28.5(L)  Platelets 150 - 400 K/uL 71(L) 85(L) 72(L)   CMP Latest Ref Rng & Units 03/06/2019 03/05/2019 02/27/2019  Glucose 70 - 99 mg/dL 229(H) 239(H) 130(H)  BUN 8 - 23 mg/dL 79(H) 82(H) 63(H)  Creatinine 0.61 - 1.24 mg/dL 5.89(H) 6.93(H) 5.73(H)  Sodium 135 - 145 mmol/L  132(L) 134(L) 135  Potassium 3.5 - 5.1 mmol/L 4.5 4.1 4.1  Chloride 98 - 111 mmol/L 90(L) 93(L) 95(L)  CO2 22 - 32 mmol/L 25 24 24   Calcium 8.9 - 10.3 mg/dL 7.9(L) 8.2(L) 8.5(L)  Total Protein 6.5 - 8.1 g/dL - 4.6(L) -  Total Bilirubin 0.3 - 1.2 mg/dL - 0.2(L) -  Alkaline Phos 38 - 126 U/L - 75 -  AST 15 - 41 U/L - 57(H) -  ALT 0 - 44 U/L - 47(H) -    CXR: pending  _______________________________________________________________  Assessment and Plan: POD 10 s/p CABG  Neuro: pain controlled. Mental status improved CV: decreased metop.  Starting midodrine for dialysis Pulm: increased WOB, will drain L effusion Renal: dialysis today.  If not able to remove enough fluid will need CRRT GI: on tube feeds, will cycle feeds Heme: stable ID: afebrile, no leukocytosis Endo: SSI  Dispo: continue ICU care.  Melodie Bouillon, MD 03/06/2019 10:58 AM

## 2019-03-06 NOTE — Progress Notes (Signed)
Progress Note  Patient Name: Robert [name]. Date of Encounter: 03/06/2019  Primary Cardiologist: Shirlee More, MD   Subjective   Confused this AM; c/p dyspnea  Inpatient Medications    Scheduled Meds: . amiodarone  400 mg Oral BID  . aspirin EC  325 mg Oral Daily   Or  . aspirin  324 mg Per Tube Daily  . calcium acetate  667 mg Oral TID WC  . Chlorhexidine Gluconate Cloth  6 each Topical Q0600  . Chlorhexidine Gluconate Cloth  6 each Topical Q0600  . darbepoetin (ARANESP) injection - DIALYSIS  60 mcg Intravenous Q Tue-HD  . doxercalciferol  4 mcg Intravenous Q T,Th,Sa-HD  . enoxaparin (LOVENOX) injection  30 mg Subcutaneous Q48H  . feeding supplement (ENSURE ENLIVE)  237 mL Oral TID BM  . feeding supplement (NEPRO CARB STEADY)  1,000 mL Per Tube Q24H  . feeding supplement (PRO-STAT SUGAR FREE 64)  30 mL Per Tube TID  . insulin aspart  0-24 Units Subcutaneous Q4H  . levothyroxine  50 mcg Oral Q0600  . mouth rinse  15 mL Mouth Rinse BID  . metoprolol tartrate  37.5 mg Oral BID  . multivitamin  1 tablet Oral QHS  . pantoprazole  40 mg Oral Daily  . rosuvastatin  40 mg Oral q1800  . sodium chloride flush  10-40 mL Intracatheter Q12H  . sodium chloride flush  3 mL Intravenous Q12H   Continuous Infusions: . sodium chloride    . sodium chloride Stopped (02/27/19 1901)  . sodium chloride    . sodium chloride     PRN Meds: sodium chloride, sodium chloride, albuterol, alteplase, budesonide (PULMICORT) nebulizer solution, dextrose, heparin, lidocaine (PF), lidocaine-prilocaine, metoprolol tartrate, morphine injection, ondansetron (ZOFRAN) IV, pentafluoroprop-tetrafluoroeth, sodium chloride flush, sodium chloride flush   Vital Signs    Vitals:   03/06/19 0300 03/06/19 0334 03/06/19 0400 03/06/19 0500  BP: (!) 90/53  (!) 128/57 103/71  Pulse: (!) 119  (!) 120 (!) 124  Resp: (!) 23  (!) 25 (!) 24  Temp:  98.3 F (36.8 C)    TempSrc:  Oral    SpO2: 92%  94% 99%   Weight:    78.8 kg  Height:        Intake/Output Summary (Last 24 hours) at 03/06/2019 0725 Last data filed at 03/06/2019 0500 Gross per 24 hour  Intake 1396.96 ml  Output 2000 ml  Net -603.04 ml   Last 3 Weights 03/06/2019 03/05/2019 03/05/2019  Weight (lbs) 173 lb 11.6 oz 183 lb 10.3 oz 185 lb 13.6 oz  Weight (kg) 78.8 kg 83.3 kg 84.3 kg      Telemetry    Atrial fibrillation with elevated rate- Personally Reviewed  Physical Exam   GEN: NAD, Confused Neck: No JVD, supple Cardiac: RRR, 2/6 systolic murmur; no DM Respiratory: Mild rhonchi GI: Soft, NT/ND MS: No edema Neuro: Moves all ext; confused  Labs    High Sensitivity Troponin:   Recent Labs  Lab 02/12/2019 1736 02/18/2019 1922 01/30/2019 2200 02/18/2019 0124 02/02/2019 0955  TROPONINIHS 81* 856* 4,043* 4,270* 4,018*      Chemistry Recent Labs  Lab 02/28/19 0250 03/01/19 0526 03/03/19 0535 03/26/2019 0218 03/05/19 0442 03/05/19 1300  NA 139 141 136 135 134*  --   K 4.2 4.2 3.5 4.1 4.1  --   CL 103 102 94* 95* 93*  --   CO2 19* 19* 26 24 24   --   GLUCOSE 156* 132* 168* 130*  239*  --   BUN 24* 42* 42* 63* 82*  --   CREATININE 4.78* 6.26* 4.97* 5.73* 6.93*  --   CALCIUM 8.4* 8.7* 8.5* 8.5* 8.2*  --   PROT 4.8* 5.1*  --   --   --  4.6*  ALBUMIN 2.3* 2.2*  --   --   --  2.0*  AST 28 23  --   --   --  57*  ALT 21 21  --   --   --  47*  ALKPHOS 67 68  --   --   --  75  BILITOT 1.7* 1.3*  --   --   --  0.2*  GFRNONAA 11* 8* 10* 9* 7*  --   GFRAA 12* 9* 12* 10* 8*  --   ANIONGAP 17* 20* 16* 16* 17*  --      Hematology Recent Labs  Lab 03/03/19 0535 03/25/2019 0218 03/05/19 0442  WBC 6.6 7.5 8.0  RBC 3.28* 3.13* 3.12*  HGB 9.9* 9.7* 9.3*  HCT 28.5* 26.9* 27.3*  MCV 86.9 85.9 87.5  MCH 30.2 31.0 29.8  MCHC 34.7 36.1* 34.1  RDW 18.6* 18.7* 19.5*  PLT 72* 85* 71*    Patient Profile     Robert [name]. is a 84 y.o. male with ESRD, HTN, CAD s/p PCI, Stroke s/p CEA, DM admitted 12/29 for new-onset Afib.  Subsequently had NSTEMI and found to have 3vCAD. S/p CABG x 4 02/19/2019.  Note preoperative echocardiogram showed normal LV function, mild mitral regurgitation, moderate aortic stenosis.  Assessment & Plan    1 persistent atrial fibrillation-in atrial fibrillation this AM and HR elevated.  Continue amiodarone 400 mg twice daily.  Increase metoprolol to 50 mg twice daily and follow.  Begin Coumadin when okay with surgery.  2 NSTEMI/coronary artery disease status post coronary artery bypass and graft-continue ASA and Crestor 40 mg daily.  3 moderate aortic stenosis-follow-up as an outpatient. May need TAVR in the future.  4 end-stage renal disease-dialysis per nephrology.  5 history of stroke status post carotid endarterectomy-medical therapy with aspirin and statin.  For questions or updates, please contact [place] Please consult [email or web address] for contact info under        Signed, Kirk Ruths, MD  03/06/2019, 7:25 AM

## 2019-03-07 ENCOUNTER — Inpatient Hospital Stay (HOSPITAL_COMMUNITY): Payer: Medicare Other

## 2019-03-07 LAB — GLUCOSE, CAPILLARY
Glucose-Capillary: 129 mg/dL — ABNORMAL HIGH (ref 70–99)
Glucose-Capillary: 130 mg/dL — ABNORMAL HIGH (ref 70–99)
Glucose-Capillary: 143 mg/dL — ABNORMAL HIGH (ref 70–99)
Glucose-Capillary: 145 mg/dL — ABNORMAL HIGH (ref 70–99)
Glucose-Capillary: 148 mg/dL — ABNORMAL HIGH (ref 70–99)

## 2019-03-07 LAB — RENAL FUNCTION PANEL
Albumin: 2.4 g/dL — ABNORMAL LOW (ref 3.5–5.0)
Anion gap: 16 — ABNORMAL HIGH (ref 5–15)
BUN: 57 mg/dL — ABNORMAL HIGH (ref 8–23)
CO2: 25 mmol/L (ref 22–32)
Calcium: 8.1 mg/dL — ABNORMAL LOW (ref 8.9–10.3)
Chloride: 97 mmol/L — ABNORMAL LOW (ref 98–111)
Creatinine, Ser: 4.3 mg/dL — ABNORMAL HIGH (ref 0.61–1.24)
GFR calc Af Amer: 14 mL/min — ABNORMAL LOW (ref >60)
GFR calc non Af Amer: 12 mL/min — ABNORMAL LOW (ref >60)
Glucose, Bld: 158 mg/dL — ABNORMAL HIGH (ref 70–99)
Phosphorus: 4.2 mg/dL (ref 2.5–4.6)
Potassium: 4.1 mmol/L (ref 3.5–5.1)
Sodium: 138 mmol/L (ref 135–145)

## 2019-03-07 LAB — MAGNESIUM: Magnesium: 2.2 mg/dL (ref 1.7–2.4)

## 2019-03-07 LAB — POCT I-STAT, CHEM 8
BUN: 59 mg/dL — ABNORMAL HIGH (ref 8–23)
Calcium, Ion: 1.09 mmol/L — ABNORMAL LOW (ref 1.15–1.40)
Chloride: 96 mmol/L — ABNORMAL LOW (ref 98–111)
Creatinine, Ser: 4.3 mg/dL — ABNORMAL HIGH (ref 0.61–1.24)
Glucose, Bld: 165 mg/dL — ABNORMAL HIGH (ref 70–99)
HCT: 30 % — ABNORMAL LOW (ref 39.0–52.0)
Hemoglobin: 10.2 g/dL — ABNORMAL LOW (ref 13.0–17.0)
Potassium: 3.9 mmol/L (ref 3.5–5.1)
Sodium: 137 mmol/L (ref 135–145)
TCO2: 30 mmol/L (ref 22–32)

## 2019-03-07 LAB — POCT ACTIVATED CLOTTING TIME: Activated Clotting Time: 131 seconds

## 2019-03-07 MED ORDER — NOREPINEPHRINE 16 MG/250ML-% IV SOLN
10.0000 ug/min | INTRAVENOUS | Status: DC
  Administered 2019-03-07: 16:00:00 10 ug/min via INTRAVENOUS
  Administered 2019-03-09: 11:00:00 8 ug/min via INTRAVENOUS
  Administered 2019-03-10: 09:00:00 45 ug/min via INTRAVENOUS
  Administered 2019-03-11 – 2019-03-13 (×7): 50 ug/min via INTRAVENOUS
  Filled 2019-03-07 (×15): qty 250

## 2019-03-07 MED ORDER — PRISMASOL BGK 4/2.5 32-4-2.5 MEQ/L IV SOLN: INTRAVENOUS | Status: DC

## 2019-03-07 MED ORDER — HEPARIN SODIUM (PORCINE) 1000 UNIT/ML DIALYSIS
1000.0000 [IU] | INTRAMUSCULAR | Status: DC | PRN
  Administered 2019-03-08: 2800 [IU] via INTRAVENOUS_CENTRAL
  Filled 2019-03-07: qty 6
  Filled 2019-03-07: qty 3
  Filled 2019-03-07: qty 6
  Filled 2019-03-07: qty 3

## 2019-03-07 MED ORDER — PRISMASOL BGK 4/2.5 32-4-2.5 MEQ/L REPLACEMENT SOLN: Status: DC

## 2019-03-07 NOTE — Procedures (Signed)
Central Venous Catheter Insertion Procedure Note Robert [name] [id number] 1936/01/02  Procedure: Insertion of Central Venous Catheter Indications: initiation of CRRT  Procedure Details Consent: Risks of procedure as well as the alternatives and risks of each were explained to the (patient/caregiver).  Consent for procedure obtained. Time Out: Verified patient identification, verified procedure, site/side was marked, verified correct patient position, special equipment/implants available, medications/allergies/relevent history reviewed, required imaging and test results available.  Performed  Maximum sterile technique was used including antiseptics, cap, gloves, gown, hand hygiene, mask and sheet. Skin prep: Chlorhexidine; local anesthetic administered A antimicrobial bonded/coated triple lumen catheter was placed in the left femoral vein due to patient being a dialysis patient using the Seldinger technique.  Evaluation Blood flow good Complications: No apparent complications Patient did tolerate procedure well.   [name] 03/07/2019, 9:38 AM

## 2019-03-07 NOTE — Procedures (Signed)
Chest Tube Insertion Procedure Note  Indications:  Clinically significant Effusion  Pre-operative Diagnosis: Effusion  Post-operative Diagnosis: Effusion  Procedure Details  Informed consent was obtained for the procedure, including sedation.  Risks of lung perforation, hemorrhage, arrhythmia, and adverse drug reaction were discussed.   After sterile skin prep, using standard technique, a 14 French tube was placed in the left lateral 4th rib space.  Findings: None  Estimated Blood Loss:  Minimal         Specimens:  None              Complications:  None; patient tolerated the procedure well.         Disposition: ICU - extubated and stable.         Condition: stable  Attending Attestation: I was present and scrubbed for the entire procedure.

## 2019-03-07 NOTE — Progress Notes (Addendum)
[facility] NEPHROLOGY PROGRESS NOTE  Assessment/ Plan: Pt is a 84 y.o. yo male with HTN, CAD, stroke, DM, admitted with an NSTEMI and found to have three-vessel CAD status post CABG on 12/30.  Dialysis: [facility] 4h   400/800    73.5kg    3/2.25Ca bath  P4  Hep none L AVF Hectorol 4 Venofer 50 Mircera 75 (last 12/15)  #CAD/NSTEMI: LHC with severe multivessel CAD status post CABG.  Cardiology, CT surgery following.  # ESRD, HD per TTS schedule normally.     - HD per TTS schedule.  Added midodrine to support UF without success - Will transition to CRRT.  Note EDW as above. - Nursing states nontunneled catheter is to be placed - have asked that they contact me to clarify if team is placing - please use alternative to morphine for pain given pt ESRD - will predispose to AMS  # Acute hypoxic resp failure - optimize volume with HD/RRT    # Anemia due to CKD: Received PRBC postop.  Aranesp weekly on Tuesdays - note was started 1/5 here   # Secondary hyperparathyroidism: Started hectorol.  Discontinue binders - starting CRRT  # hypotension: note on regimen for his afib.  Added midodrine to support UF and patient is on levo  #Paroxysmal A. fib: On amiodarone.  Anticoagulation per primary team  #AMS, agitation:please avoid morphine given ESRD patient - will exacerbate AMS    Subjective:  Patient had HD on 1/9 with only 500 mL UF of a 3 kg goal despite midodrine.  He has been initiated on levo   Review of systems:  Reports intermittent shortness of breath  No chest pain denies n/v    Objective Vital signs in last 24 hours: Vitals:   03/07/19 0200 03/07/19 0300 03/07/19 0400 03/07/19 0500  BP: (!) 78/43 (!) 102/49 (!) 114/49   Pulse: (!) 28 (!) 36 63   Resp: (!) 23 (!) 22 (!) 23   Temp:   98.7 F (37.1 C)   TempSrc:   Axillary   SpO2: (!) 74% 97% 97%   Weight:    77.5 kg  Height:       Weight change: -2.1 kg  Intake/Output Summary (Last 24 hours) at  03/07/2019 0525 Last data filed at 03/06/2019 1455 Gross per 24 hour  Intake 400 ml  Output 583 ml  Net -183 ml    Labs: Basic Metabolic Panel: Recent Labs  Lab 03/03/19 1919 03/03/2019 0218 03/05/19 0442 03/05/19 1300 03/06/19 1010  NA  --  135 134*  --  132*  K  --  4.1 4.1  --  4.5  CL  --  95* 93*  --  90*  CO2  --  24 24  --  25  GLUCOSE  --  130* 239*  --  229*  BUN  --  63* 82*  --  79*  CREATININE  --  5.73* 6.93*  --  5.89*  CALCIUM  --  8.5* 8.2*  --  7.9*  PHOS 5.2*  --   --  5.1* 4.2   Liver Function Tests: Recent Labs  Lab 03/01/19 0526 03/05/19 1300 03/06/19 1010  AST 23 57*  --   ALT 21 47*  --   ALKPHOS 68 75  --   BILITOT 1.3* 0.2*  --   PROT 5.1* 4.6*  --   ALBUMIN 2.2* 2.0* 1.9*    CBC: Recent Labs  Lab 03/01/19 0526 03/02/19 0506 03/03/19 0535  03/24/2019 0218 03/05/19 0442  WBC 8.7 9.8 6.6 7.5 8.0  HGB 10.1* 10.0* 9.9* 9.7* 9.3*  HCT 29.8* 30.0* 28.5* 26.9* 27.3*  MCV 90.3 90.9 86.9 85.9 87.5  PLT 82* 100* 72* 85* 71*   Cardiac Enzymes: No results for input(s): CKTOTAL, CKMB, CKMBINDEX, TROPONINI in the last 168 hours. CBG: Recent Labs  Lab 03/06/19 1120 03/06/19 1506 03/06/19 1949 03/06/19 2350 03/07/19 0322  GLUCAP 176* 173* 152* 102* 129*    Iron Studies: No results for input(s): IRON, TIBC, TRANSFERRIN, FERRITIN in the last 72 hours. Studies/Results: DG CHEST PORT 1 VIEW  Result Date: 03/06/2019 CLINICAL DATA:  Shortness of breath EXAM: PORTABLE CHEST 1 VIEW COMPARISON:  Chest radiograph dated 03/23/2019 FINDINGS: An enteric tube enters the stomach and terminates below the field of view. A left internal jugular vascular sheath is unchanged in position. Median sternotomy wires and a cardiac loop recorder are unchanged. The heart remains enlarged. A layering left pleural effusion with associated atelectasis/airspace disease is not significantly changed accounting for differences in technique. There is no right pleural effusion. The  right lung is clear. There is no pneumothorax. IMPRESSION: 1. No significant change in layering left pleural effusion with associated atelectasis/airspace disease. 2. Unchanged cardiomegaly. Electronically Signed   By: Zerita Boers M.D.   On: 03/06/2019 12:12   [place] EKG SITE RITE  Result Date: 03/05/2019 If Site Rite image not attached, placement could not be confirmed due to current cardiac rhythm.   Medications: Infusions: . sodium chloride    . sodium chloride Stopped (02/27/19 1901)  . sodium chloride    . sodium chloride    . norepinephrine (LEVOPHED) Adult infusion 2 mcg/min (03/07/19 0208)    Scheduled Medications: . amiodarone  400 mg Oral BID  . aspirin EC  325 mg Oral Daily   Or  . aspirin  324 mg Per Tube Daily  . calcium acetate  667 mg Oral TID WC  . Chlorhexidine Gluconate Cloth  6 each Topical Q0600  . Chlorhexidine Gluconate Cloth  6 each Topical Q0600  . Chlorhexidine Gluconate Cloth  6 each Topical Q0600  . darbepoetin (ARANESP) injection - DIALYSIS  60 mcg Intravenous Q Tue-HD  . doxercalciferol  4 mcg Intravenous Q T,Th,Sa-HD  . enoxaparin (LOVENOX) injection  30 mg Subcutaneous Q48H  . feeding supplement (ENSURE ENLIVE)  237 mL Oral TID BM  . feeding supplement (NEPRO CARB STEADY)  1,000 mL Per Tube Q24H  . feeding supplement (PRO-STAT SUGAR FREE 64)  30 mL Per Tube TID  . insulin aspart  0-24 Units Subcutaneous Q4H  . levothyroxine  50 mcg Oral Q0600  . mouth rinse  15 mL Mouth Rinse BID  . metoprolol tartrate  25 mg Oral BID  . midodrine  10 mg Oral TID WC  . multivitamin  1 tablet Oral QHS  . pantoprazole  40 mg Oral Daily  . rosuvastatin  40 mg Oral q1800  . sodium chloride flush  10-40 mL Intracatheter Q12H  . sodium chloride flush  3 mL Intravenous Q12H    have reviewed scheduled and prn medications.  Physical Exam:   General: Lying in bed  Heart:s1s2 irregular Lungs:reduced bilaterally; on 5 liters oxygen; increased work of breathing with  exertion Abdomen:soft, Non-tender, non-distended Extremities: trace to 1+ edema lower extremities Neuro talkative but confused - answers some questions Dialysis Access: Left upper extremity AV fistula has bruit and thrill.  Claudia Desanctis 03/07/2019,5:25 AM

## 2019-03-07 NOTE — Progress Notes (Signed)
      [address]       [place] [address]             [phone]                 11 Days Post-Op Procedure(s) (LRB): CORONARY ARTERY BYPASS GRAFTING (CABG) using endoscopic greater saphenous vein harvest: SVC to Diag1; SVC to OM1; SVC to PDA and LIMA to LAD. (N/A) Clipping Of Atrial Appendage using 45 MM AtriCure AtriClip (N/A)   Events: Incomplete dialysis yesterday On low dose levo _______________________________________________________________ Vitals: BP (!) 124/57   Pulse 69   Temp 98.5 F (36.9 C) (Oral)   Resp (!) 23   Ht 5\' 8"  (1.727 m)   Wt 77.5 kg   SpO2 99%   BMI 25.98 kg/m   - Neuro: awake, follows commands  - Cardiovascular: sinus Levo at 1  .      - Pulm: coarse BS FiO2 (%):  [40 %-100 %] 40 %  ABG    Component Value Date/Time   PHART 7.323 (L) 02/25/2019 1150   PCO2ART 32.3 02/25/2019 1150   PO2ART 61.0 (L) 02/25/2019 1150   HCO3 16.8 (L) 02/25/2019 1150   TCO2 18 (L) 02/25/2019 1150   ACIDBASEDEF 9.0 (H) 02/25/2019 1150   O2SAT 90.0 02/25/2019 1150    - Abd: soft - Extremity: edematous  .Intake/Output      01/09 0701 - 01/10 0700 01/10 0701 - 01/11 0700   P.O.     I.V. (mL/kg) 28.9 (0.4)    NG/GT 350    Total Intake(mL/kg) 378.9 (4.9)    Urine (mL/kg/hr)     Other 583    Stool 0    Total Output 583    Net -204.1         Stool Occurrence 8 x       _______________________________________________________________ Labs: CBC Latest Ref Rng & Units 03/05/2019 03/02/2019 03/03/2019  WBC 4.0 - 10.5 K/uL 8.0 7.5 6.6  Hemoglobin 13.0 - 17.0 g/dL 9.3(L) 9.7(L) 9.9(L)  Hematocrit 39.0 - 52.0 % 27.3(L) 26.9(L) 28.5(L)  Platelets 150 - 400 K/uL 71(L) 85(L) 72(L)   CMP Latest Ref Rng & Units 03/06/2019 03/05/2019 03/12/2019  Glucose 70 - 99 mg/dL 229(H) 239(H) 130(H)  BUN 8 - 23 mg/dL 79(H) 82(H) 63(H)  Creatinine 0.61 - 1.24 mg/dL 5.89(H) 6.93(H) 5.73(H)  Sodium 135 - 145 mmol/L 132(L) 134(L) 135  Potassium 3.5 - 5.1 mmol/L  4.5 4.1 4.1  Chloride 98 - 111 mmol/L 90(L) 93(L) 95(L)  CO2 22 - 32 mmol/L 25 24 24   Calcium 8.9 - 10.3 mg/dL 7.9(L) 8.2(L) 8.5(L)  Total Protein 6.5 - 8.1 g/dL - 4.6(L) -  Total Bilirubin 0.3 - 1.2 mg/dL - 0.2(L) -  Alkaline Phos 38 - 126 U/L - 75 -  AST 15 - 41 U/L - 57(H) -  ALT 0 - 44 U/L - 47(H) -    CXR: pending  _______________________________________________________________  Assessment and Plan: POD 11 s/p CABG  Neuro: pain controlled. Mental status improved CV: on low dose levo.  Back in sinus Pulm: increased WOB, will drain L effusion Renal: CRRT today GI: on tube feeds, will cycle feeds Heme: stable ID: afebrile, no leukocytosis Endo: SSI  Dispo: continue ICU care.  Melodie Bouillon, MD 03/07/2019 9:39 AM

## 2019-03-07 NOTE — Procedures (Signed)
Central Venous Catheter Insertion Procedure Note Robert [name] [id number] [birth year]  Procedure: Insertion of Central Venous Catheter Indications: Drug and/or fluid administration  Procedure Details Consent: Risks of procedure as well as the alternatives and risks of each were explained to the (patient/caregiver).  Consent for procedure obtained. Time Out: Verified patient identification, verified procedure, site/side was marked, verified correct patient position, special equipment/implants available, medications/allergies/relevent history reviewed, required imaging and test results available.  Performed  Maximum sterile technique was used including antiseptics, cap, gloves, gown, hand hygiene, mask and sheet. Skin prep: Chlorhexidine; local anesthetic administered A antimicrobial bonded/coated triple lumen catheter was placed in the right subclavian vein using the Seldinger technique.  Evaluation Blood flow good Complications: No apparent complications Patient did tolerate procedure well. Chest X-ray ordered to verify placement.  CXR: pending.  [name] 03/07/2019, 9:32 AM

## 2019-03-07 NOTE — Progress Notes (Addendum)
Unable to get temp on patient, orally and axillary. Pt covered with warm blankets and blood warmer placed on patient's dialysis circuit. Temp turned up in room. Will reassess patient's temp again shortly.   Update: pt temp 95.9. Will keep warming interventions on for now.

## 2019-03-07 NOTE — Progress Notes (Signed)
SLP Cancellation Note  Patient Details Name: Robert [name]. MRN: [id number] DOB: 1936/01/15   Cancelled treatment:        Spoke with RN to determine if pt is ready for MBSS today.  Pt is not medically appropriate for instrumental swallow study at this time.  SLP will follow for readiness.   Celedonio Savage, MA, [place] Office: [phone]; Pager [phone]): [phone] 03/07/2019, 2:27 PM

## 2019-03-08 ENCOUNTER — Inpatient Hospital Stay (HOSPITAL_COMMUNITY): Payer: Medicare Other

## 2019-03-08 LAB — CBC
HCT: 27.9 % — ABNORMAL LOW (ref 39.0–52.0)
Hemoglobin: 8.5 g/dL — ABNORMAL LOW (ref 13.0–17.0)
MCH: 30 pg (ref 26.0–34.0)
MCHC: 30.5 g/dL (ref 30.0–36.0)
MCV: 98.6 fL (ref 80.0–100.0)
Platelets: 110 10*3/uL — ABNORMAL LOW (ref 150–400)
RBC: 2.83 MIL/uL — ABNORMAL LOW (ref 4.22–5.81)
RDW: 20.1 % — ABNORMAL HIGH (ref 11.5–15.5)
WBC: 9.3 10*3/uL (ref 4.0–10.5)
nRBC: 0.5 % — ABNORMAL HIGH (ref 0.0–0.2)

## 2019-03-08 LAB — RENAL FUNCTION PANEL
Albumin: 2.3 g/dL — ABNORMAL LOW (ref 3.5–5.0)
Albumin: 2.2 g/dL — ABNORMAL LOW (ref 3.5–5.0)
Anion gap: 13 (ref 5–15)
Anion gap: 11 (ref 5–15)
BUN: 40 mg/dL — ABNORMAL HIGH (ref 8–23)
BUN: 36 mg/dL — ABNORMAL HIGH (ref 8–23)
CO2: 26 mmol/L (ref 22–32)
CO2: 26 mmol/L (ref 22–32)
Calcium: 8.1 mg/dL — ABNORMAL LOW (ref 8.9–10.3)
Calcium: 8.1 mg/dL — ABNORMAL LOW (ref 8.9–10.3)
Chloride: 99 mmol/L (ref 98–111)
Chloride: 101 mmol/L (ref 98–111)
Creatinine, Ser: 2.8 mg/dL — ABNORMAL HIGH (ref 0.61–1.24)
Creatinine, Ser: 2.69 mg/dL — ABNORMAL HIGH (ref 0.61–1.24)
GFR calc Af Amer: 23 mL/min — ABNORMAL LOW (ref >60)
GFR calc Af Amer: 25 mL/min — ABNORMAL LOW (ref >60)
GFR calc non Af Amer: 20 mL/min — ABNORMAL LOW (ref >60)
GFR calc non Af Amer: 21 mL/min — ABNORMAL LOW (ref >60)
Glucose, Bld: 151 mg/dL — ABNORMAL HIGH (ref 70–99)
Glucose, Bld: 108 mg/dL — ABNORMAL HIGH (ref 70–99)
Phosphorus: 3.1 mg/dL (ref 2.5–4.6)
Phosphorus: 2.4 mg/dL — ABNORMAL LOW (ref 2.5–4.6)
Potassium: 4 mmol/L (ref 3.5–5.1)
Potassium: 3.8 mmol/L (ref 3.5–5.1)
Sodium: 138 mmol/L (ref 135–145)
Sodium: 138 mmol/L (ref 135–145)

## 2019-03-08 LAB — GLUCOSE, CAPILLARY
Glucose-Capillary: 100 mg/dL — ABNORMAL HIGH (ref 70–99)
Glucose-Capillary: 124 mg/dL — ABNORMAL HIGH (ref 70–99)
Glucose-Capillary: 144 mg/dL — ABNORMAL HIGH (ref 70–99)
Glucose-Capillary: 147 mg/dL — ABNORMAL HIGH (ref 70–99)
Glucose-Capillary: 153 mg/dL — ABNORMAL HIGH (ref 70–99)
Glucose-Capillary: 163 mg/dL — ABNORMAL HIGH (ref 70–99)

## 2019-03-08 LAB — MAGNESIUM: Magnesium: 2.3 mg/dL (ref 1.7–2.4)

## 2019-03-08 MED ORDER — AMIODARONE LOAD VIA INFUSION
150.0000 mg | Freq: Once | INTRAVENOUS | Status: AC
  Administered 2019-03-08: 150 mg via INTRAVENOUS
  Filled 2019-03-08: qty 83.34

## 2019-03-08 MED ORDER — AMIODARONE LOAD VIA INFUSION
400.0000 mg | Freq: Once | INTRAVENOUS | Status: DC
  Filled 2019-03-08: qty 222.23

## 2019-03-08 MED ORDER — AMIODARONE HCL IN DEXTROSE 360-4.14 MG/200ML-% IV SOLN
30.0000 mg/h | INTRAVENOUS | Status: DC
  Administered 2019-03-08: 19:00:00 30 mg/h via INTRAVENOUS

## 2019-03-08 MED ORDER — AMIODARONE HCL IN DEXTROSE 360-4.14 MG/200ML-% IV SOLN: 30.0000 mg/h | INTRAVENOUS | Status: DC

## 2019-03-08 MED ORDER — AMIODARONE HCL IN DEXTROSE 360-4.14 MG/200ML-% IV SOLN
30.0000 mg/h | INTRAVENOUS | Status: AC
  Administered 2019-03-09: 01:00:00 30 mg/h via INTRAVENOUS
  Filled 2019-03-08 (×2): qty 200

## 2019-03-08 MED ORDER — AMIODARONE HCL IN DEXTROSE 360-4.14 MG/200ML-% IV SOLN
30.0000 mg/h | INTRAVENOUS | Status: DC
  Administered 2019-03-09 – 2019-03-13 (×6): 30 mg/h via INTRAVENOUS
  Filled 2019-03-08 (×9): qty 200

## 2019-03-08 NOTE — Progress Notes (Signed)
PT Cancellation Note  Patient Details Name: Robert [name]. MRN: [id number] DOB: 1935-07-17   Cancelled Treatment:    Reason Eval/Treat Not Completed: Medical issues which prohibited therapy. Pt on CRRT via femoral line. If pt to remain with non-tunneled femoral access for a few days, may benefit from tilt bed to allow for mobilization and weight bearing without hip flexion. Will follow-up regarding this and appropriateness for PT treatment.  Robert [name], PT, DPT Acute Rehabilitation Services  Pager [phone] Office [phone]  Derry Lory 03/08/2019, 11:09 AM

## 2019-03-08 NOTE — Progress Notes (Signed)
Progress Note  Patient Name: Robert [name]. Date of Encounter: 03/08/2019  Primary Cardiologist: Shirlee More, MD   Subjective   On dialysis. Denies any chest pain or dyspnea. Appetite is fair  Inpatient Medications    Scheduled Meds: . amiodarone  400 mg Oral BID  . aspirin EC  325 mg Oral Daily   Or  . aspirin  324 mg Per Tube Daily  . Chlorhexidine Gluconate Cloth  6 each Topical Q0600  . darbepoetin (ARANESP) injection - DIALYSIS  60 mcg Intravenous Q Tue-HD  . doxercalciferol  4 mcg Intravenous Q T,Th,Sa-HD  . enoxaparin (LOVENOX) injection  30 mg Subcutaneous Q48H  . feeding supplement (ENSURE ENLIVE)  237 mL Oral TID BM  . feeding supplement (NEPRO CARB STEADY)  1,000 mL Per Tube Q24H  . feeding supplement (PRO-STAT SUGAR FREE 64)  30 mL Per Tube TID  . insulin aspart  0-24 Units Subcutaneous Q4H  . levothyroxine  50 mcg Oral Q0600  . mouth rinse  15 mL Mouth Rinse BID  . metoprolol tartrate  25 mg Oral BID  . midodrine  10 mg Oral TID WC  . multivitamin  1 tablet Oral QHS  . pantoprazole  40 mg Oral Daily  . rosuvastatin  40 mg Oral q1800  . sodium chloride flush  10-40 mL Intracatheter Q12H  . sodium chloride flush  3 mL Intravenous Q12H   Continuous Infusions: .  prismasol BGK 4/2.5 400 mL/hr at 03/08/19 0005  .  prismasol BGK 4/2.5 300 mL/hr at 03/08/19 0402  . sodium chloride    . sodium chloride Stopped (02/27/19 1901)  . sodium chloride    . sodium chloride    . norepinephrine (LEVOPHED) Adult infusion 2 mcg/min (03/08/19 0800)  . prismasol BGK 4/2.5 1,800 mL/hr at 03/08/19 0618   PRN Meds: sodium chloride, sodium chloride, albuterol, alteplase, budesonide (PULMICORT) nebulizer solution, dextrose, heparin, heparin, lidocaine (PF), lidocaine-prilocaine, metoprolol tartrate, morphine injection, ondansetron (ZOFRAN) IV, pentafluoroprop-tetrafluoroeth, sodium chloride flush, sodium chloride flush   Vital Signs    Vitals:   03/08/19 0630 03/08/19  0645 03/08/19 0700 03/08/19 0800  BP: 136/61 113/65 (!) 121/53 (!) 134/51  Pulse: 72 70 65 70  Resp: 20 15 20 19   Temp:   (!) 97.4 F (36.3 C)   TempSrc:   Oral   SpO2: 99% 100% 100% 100%  Weight:      Height:        Intake/Output Summary (Last 24 hours) at 03/08/2019 0902 Last data filed at 03/08/2019 0800 Gross per 24 hour  Intake 1400.25 ml  Output 3007 ml  Net -1606.75 ml   Last 3 Weights 03/08/2019 03/07/2019 03/06/2019  Weight (lbs) 162 lb 4.1 oz 170 lb 13.7 oz 177 lb 14.6 oz  Weight (kg) 73.6 kg 77.5 kg 80.7 kg      Telemetry    NSR with few PACs- Personally Reviewed  Physical Exam   GEN: Elderly WM, NAD Neck: No JVD, supple. Dialysis catheter in place Cardiac: RRR, 2/6 systolic murmur RUSB; no DM Respiratory: clear GI: Soft, NT/ND MS: No edema Neuro: alert oriented x 3. Nonfocal.   Labs    High Sensitivity Troponin:   Recent Labs  Lab 02/18/2019 1736 02/12/2019 1922 01/27/2019 2200 02/10/2019 0124 02/08/2019 0955  TROPONINIHS 81* 856* 4,043* 4,270* 4,018*      Chemistry Recent Labs  Lab 03/05/19 1300 03/06/19 1010 03/07/19 1306 03/07/19 1610 03/08/19 0444  NA  --  132* 137 138 138  K  --  4.5 3.9 4.1 4.0  CL  --  90* 96* 97* 99  CO2  --  25  --  25 26  GLUCOSE  --  229* 165* 158* 151*  BUN  --  79* 59* 57* 40*  CREATININE  --  5.89* 4.30* 4.30* 2.80*  CALCIUM  --  7.9*  --  8.1* 8.1*  PROT 4.6*  --   --   --   --   ALBUMIN 2.0* 1.9*  --  2.4* 2.3*  AST 57*  --   --   --   --   ALT 47*  --   --   --   --   ALKPHOS 75  --   --   --   --   BILITOT 0.2*  --   --   --   --   GFRNONAA  --  8*  --  12* 20*  GFRAA  --  10*  --  14* 23*  ANIONGAP  --  17*  --  16* 13     Hematology Recent Labs  Lab 03/01/2019 0218 03/05/19 0442 03/07/19 1306 03/08/19 0444  WBC 7.5 8.0  --  9.3  RBC 3.13* 3.12*  --  2.83*  HGB 9.7* 9.3* 10.2* 8.5*  HCT 26.9* 27.3* 30.0* 27.9*  MCV 85.9 87.5  --  98.6  MCH 31.0 29.8  --  30.0  MCHC 36.1* 34.1  --  30.5  RDW 18.7*  19.5*  --  20.1*  PLT 85* 71*  --  110*    Patient Profile     Robert [name]. is a 84 y.o. male with ESRD, HTN, CAD s/p PCI, Stroke s/p CEA, DM admitted 12/29 for new-onset Afib. Subsequently had NSTEMI and found to have 3vCAD. S/p CABG x 4 02/22/2019.  Note preoperative echocardiogram showed normal LV function, mild mitral regurgitation, moderate aortic stenosis.  Assessment & Plan    1.  Atrial fibrillation-in atrial fibrillation on admission and post op. Now converted to NSR.  Continue amiodarone 400 mg twice daily for one week then 400 mg daily.  On metoprolol. He did have LA clipping. [place] vasc score of 7. Recommend  Coumadin when okay with surgery.  2.  NSTEMI/ with severe 3 vessel coronary artery disease status post coronary artery bypass graft-continue ASA  and Crestor 40 mg daily.  3.  Moderate aortic stenosis-follow-up as an outpatient. May need TAVR in the future.  4.  End-stage renal disease-dialysis per nephrology.  5.  History of stroke status post carotid endarterectomy-medical therapy with aspirin and statin.  6.  HTN. BP is normal today on metoprolol. Weaning Norepinephrine.   7. HLD on high dose statin.  For questions or updates, please contact [place] Please consult [email or web address] for contact info under        Signed, Jodey Burbano [place], MD  03/08/2019, 9:02 AM

## 2019-03-08 NOTE — Progress Notes (Signed)
Occupational Therapy Evaluation  Limited evaluation completed due to pt on CRRT, and has Lt temporary femoral line.  He presents with the below listed deficits and was limited also by fatigue.  He was able to complete Rt UE and LE exercise before falling asleep.  He currently requires max - total A for ADLs.  He is unable to provide info re: his PLOF, Anticipate he will need extensive post acute rehab, and venue will be determined based on how he progresses.  DME needs to be further assessed.  Acute OT will follow to allow him to maximize safety and independence with ADLs.      03/08/19 1300  OT Visit Information  Last OT Received On 03/08/19  Assistance Needed +2  History of Present Illness 84 y.o. male with a hx of ESRD on HD, HTN, DM2, CAD s/p DES x 3, stroke x2 (2017 and 2018) s/p loop recorder, carotid artery disease s/p right CEA 2018 who is being seen today for the evaluation of new onset afib. Pt underwent CABG x 4 on 12/30 and clipping of atrial appendage.  Pt started on CRRT 1/10/20201 with Lt femoral temporary HD line   Precautions  Precautions Fall;Other (comment)  Precaution Comments Lt temp HD catheter   Restrictions  Weight Bearing Restrictions No  Home Living  Family/patient expects to be discharged to: Private residence  Living Arrangements Children  Available Help at Discharge Family;Available PRN/intermittently  Type of Home House  Home Access Stairs to enter  Entrance Stairs-Number of Steps 4  Entrance Stairs-Rails Left  Home Layout Multi-level  Bathroom Shower/Tub Walk-in Warden/ranger seat  Additional Comments info gleaned from previous admission as pt unable to provide info and no family present   Prior Function  Comments Pt unable to provide info   Communication  Communication HOH  Pain Assessment  Pain Assessment Faces  Faces Pain Scale 0  Cognition  Arousal/Alertness Lethargic  Behavior During Therapy Flat  affect  Overall Cognitive Status Impaired/Different from baseline  Area of Impairment Attention;Following commands  Current Attention Level Focused;Sustained  Following Commands Follows one step commands inconsistently;Follows one step commands with increased time  General Comments Pt initially awake and alert, but difficult to sustain arousal.  He is slow to process info and slow to respond   Upper Extremity Assessment  Upper Extremity Assessment Generalized weakness  Lower Extremity Assessment  Lower Extremity Assessment Generalized weakness  ADL  Overall ADL's  Needs assistance/impaired  Grooming Wash/dry hands;Wash/dry face;Moderate assistance;Bed level  Upper Body Bathing Total assistance;Bed level  Lower Body Bathing Total assistance;Bed level  Upper Body Dressing  Total assistance;Bed level  Lower Body Dressing Total assistance;Sit to/from Administrator- Water quality scientist and Hygiene Total assistance;Bed level  General ADL Comments limited by lethargy as well as CRRT with femoral line   Bed Mobility  Overal bed mobility Needs Assistance  Bed Mobility Rolling;Supine to Sit;Sit to Supine  Rolling Total assist  General bed mobility comments Pt too fatigued to assist   Transfers  General transfer comment unable to attempt due to femoral HD line   General Comments  General comments (skin integrity, edema, etc.) VSS pt on CRRT   Exercises  Exercises Other exercises;General Upper Extremity  General Exercises - Upper Extremity  Shoulder Flexion AROM;Right;5 reps;Supine  Elbow Flexion AROM;Right;10 reps;Supine  Elbow Extension AROM;Right;10 reps;Supine  Digit Composite Flexion AROM;Right;5 reps;Supine  Composite Extension AROM;Right;5 reps;Supine  Other Exercises  Other Exercises Performed 7 reps straight leg raises with Rt LE before falling asleep   OT - End of Session  Equipment Utilized During Treatment Oxygen  Activity Tolerance  Patient limited by lethargy  Patient left in bed;with call bell/phone within reach  OT Assessment  OT Recommendation/Assessment Patient needs continued OT Services  OT Visit Diagnosis Unsteadiness on feet (R26.81);Muscle weakness (generalized) (M62.81)  OT Problem List Decreased strength;Decreased range of motion;Decreased activity tolerance;Impaired balance (sitting and/or standing);Decreased cognition;Decreased safety awareness;Decreased knowledge of use of DME or AE;Decreased knowledge of precautions;Cardiopulmonary status limiting activity;Impaired UE functional use  OT Plan  OT Frequency (ACUTE ONLY) Min 2X/week  OT Treatment/Interventions (ACUTE ONLY) Self-care/ADL training;Therapeutic exercise;Energy conservation;DME and/or AE instruction;Therapeutic activities;Cognitive remediation/compensation;Patient/family education;Balance training  AM-PAC OT "6 Clicks" Daily Activity Outcome Measure (Version 2)  Help from another person eating meals? 2  Help from another person taking care of personal grooming? 2  Help from another person toileting, which includes using toliet, bedpan, or urinal? 1  Help from another person bathing (including washing, rinsing, drying)? 1  Help from another person to put on and taking off regular upper body clothing? 1  Help from another person to put on and taking off regular lower body clothing? 1  6 Click Score 8  OT Recommendation  Recommendations for Other Services  (TBD depending on progress )  Follow Up Recommendations CIR;SNF (depending on progress )  OT Equipment None recommended by OT  Individuals Consulted  Consulted and Agree with Results and Recommendations Patient unable/family or caregiver not available  Acute Rehab OT Goals  Patient Stated Goal Pt unable to state   OT Goal Formulation Patient unable to participate in goal setting  Time For Goal Achievement 03/22/19  Potential to Achieve Goals Good  OT Time Calculation  OT Start Time (ACUTE  ONLY) 1324  OT Stop Time (ACUTE ONLY) 1334  OT Time Calculation (min) 10 min  OT General Charges  $OT Visit 1 Visit  OT Evaluation  $OT Eval Moderate Complexity 1 Mod  Written Expression  Dominant Hand Right  [name]., OTR/L Acute Rehabilitation Services Pager [phone] Office [phone]

## 2019-03-08 NOTE — Progress Notes (Signed)
Verbal order to disconnect patient from CRRT given to RN.

## 2019-03-08 NOTE — Progress Notes (Signed)
      [address]       [place] [address]             [phone]                 12 Days Post-Op Procedure(s) (LRB): CORONARY ARTERY BYPASS GRAFTING (CABG) using endoscopic greater saphenous vein harvest: SVC to Diag1; SVC to OM1; SVC to PDA and LIMA to LAD. (N/A) Clipping Of Atrial Appendage using 45 MM AtriCure AtriClip (N/A)   Events: Tolerating CRRT On levo at 2 _______________________________________________________________ Vitals: BP (!) 134/51   Pulse 70   Temp (!) 97.4 F (36.3 C) (Oral)   Resp 19   Ht 5\' 8"  (1.727 m)   Wt 73.6 kg   SpO2 100%   BMI 24.67 kg/m   - Neuro: awake, follows commands  - Cardiovascular: sinus Levo at 2 Mild drainage from inferior tip of incision  .      - Pulm: coarse BS FiO2 (%):  [32 %] 32 %  ABG    Component Value Date/Time   PHART 7.323 (L) 02/25/2019 1150   PCO2ART 32.3 02/25/2019 1150   PO2ART 61.0 (L) 02/25/2019 1150   HCO3 16.8 (L) 02/25/2019 1150   TCO2 30 03/07/2019 1306   ACIDBASEDEF 9.0 (H) 02/25/2019 1150   O2SAT 90.0 02/25/2019 1150    - Abd: soft - Extremity: edematous  .Intake/Output      01/10 0701 - 01/11 0700 01/11 0701 - 01/12 0700   P.O. 0    I.V. (mL/kg) 280.3 (3.8) 1.9 (0)   NG/GT 1099.2 30   Total Intake(mL/kg) 1379.4 (18.7) 31.9 (0.4)   Urine (mL/kg/hr) 0 (0)    Other 2762 155   Stool     Chest Tube 90    Total Output 2852 155   Net -1472.6 -123.1           _______________________________________________________________ Labs: CBC Latest Ref Rng & Units 03/08/2019 03/07/2019 03/05/2019  WBC 4.0 - 10.5 K/uL 9.3 - 8.0  Hemoglobin 13.0 - 17.0 g/dL 8.5(L) 10.2(L) 9.3(L)  Hematocrit 39.0 - 52.0 % 27.9(L) 30.0(L) 27.3(L)  Platelets 150 - 400 K/uL 110(L) - 71(L)   CMP Latest Ref Rng & Units 03/08/2019 03/07/2019 03/07/2019  Glucose 70 - 99 mg/dL 151(H) 158(H) 165(H)  BUN 8 - 23 mg/dL 40(H) 57(H) 59(H)  Creatinine 0.61 - 1.24 mg/dL 2.80(H) 4.30(H) 4.30(H)  Sodium 135 - 145  mmol/L 138 138 137  Potassium 3.5 - 5.1 mmol/L 4.0 4.1 3.9  Chloride 98 - 111 mmol/L 99 97(L) 96(L)  CO2 22 - 32 mmol/L 26 25 -  Calcium 8.9 - 10.3 mg/dL 8.1(L) 8.1(L) -  Total Protein 6.5 - 8.1 g/dL - - -  Total Bilirubin 0.3 - 1.2 mg/dL - - -  Alkaline Phos 38 - 126 U/L - - -  AST 15 - 41 U/L - - -  ALT 0 - 44 U/L - - -    CXR: pending  _______________________________________________________________  Assessment and Plan: POD 12 s/p CABG  Neuro: pain controlled. Mental status improved CV: on low dose levo.  Back in sinus.  Continue betadine dressings to incision Pulm: increased WOB, pigtail not draining effusion.  Pt had lateral adhesions intra-op.  Have ordered US thoracentesis Renal: CRRT today GI: on tube feeds, will cycle feeds Heme: stable ID: afebrile, no leukocytosis Endo: SSI  Dispo: continue ICU care.  Melodie Bouillon, MD 03/08/2019 8:49 AM

## 2019-03-08 NOTE — Progress Notes (Signed)
SLP Cancellation Note  Patient Details Name: Robert [name]. MRN: [id number] DOB: January 23, 1936   Cancelled treatment:       Reason Eval/Treat Not Completed: Medical issues which prohibited therapy; pt getting chest tube per RN and on dialysis. Will continue to monitor for readiness for MBSS.     [place], CCC-SLP  Acute Rehabilitation Services  03/08/2019, 8:58 AM

## 2019-03-08 NOTE — Progress Notes (Signed)
IR unable to travel up to patient's room for bedside procedure today.  Discussed with RN.  Will re-attempt tomorrow.   Brynda Greathouse, MS RD PA-C 4:02 PM

## 2019-03-08 NOTE — Progress Notes (Signed)
TCTS paged due to patient converting into A Fib RVR. HR 110's - 140's. BP 111/90, MAP 97. See new orders.

## 2019-03-08 NOTE — Progress Notes (Signed)
TCTS BRIEF SICU PROGRESS NOTE  12 Days Post-Op  S/P Procedure(s) (LRB): CORONARY ARTERY BYPASS GRAFTING (CABG) using endoscopic greater saphenous vein harvest: SVC to Diag1; SVC to OM1; SVC to PDA and LIMA to LAD. (N/A) Clipping Of Atrial Appendage using 45 MM AtriCure AtriClip (N/A)   Thoracentesis not done today Back into rapid Afib this evening Breathing comfortably w/ O2 sats 98-100% on HFNC BP 90-100 on low dose levophed  Plan: Restart IV amiodarone w/ extra bolus.  Monitor closely  Rexene Alberts, MD 03/08/2019 8:14 PM

## 2019-03-08 NOTE — Progress Notes (Signed)
Robert [name] PROGRESS NOTE  Assessment/ Plan: Pt is a 84 y.o. yo male with HTN, CAD, stroke, DM, admitted with an NSTEMI and found to have three-vessel CAD status post CABG on 12/30.  Dialysis: [facility] 4h   400/800    73.5kg    3/2.25Ca bath  P4  Hep none L AVF Hectorol 4 Venofer 50 Mircera 75 (last 12/15)  Problems: #CAD/NSTEMI: LHC with severe multivessel CAD status post CABG.  Cardiology, CT surgery following.  # ESRD, HD per TTS schedule normally, changed to CRRT yest 1/10 due to vol issues and hypotension. Weaning down pressors this am. Wt's suggesting down to dry wt, will repeat and make decision about further CRRT later this afternoon. CXR showing L effusion, no edema.   # Acute hypoxic resp failure - optimize volume with HD/RRT    # Anemia due to CKD: Received PRBC postop.  Aranesp weekly on Tuesdays - note was started 1/5 here   # Secondary hyperparathyroidism: Started hectorol.  Discontinue binders - starting CRRT  # hypotension: note on regimen for his afib.  Added midodrine to support UF and patient is on levo  #Paroxysmal A. fib: On amiodarone.  Anticoagulation per primary team  #AMS, agitation:please avoid morphine given ESRD patient - will exacerbate AMS   Sol Blazing 03/08/2019,10:08 AM   Subjective:  Wt's down, UF net 1.1 L yest   Objective Vital signs in last 24 hours: Vitals:   03/08/19 0630 03/08/19 0645 03/08/19 0700 03/08/19 0800  BP: 136/61 113/65 (!) 121/53 (!) 134/51  Pulse: 72 70 65 70  Resp: 20 15 20 19   Temp:   (!) 97.4 F (36.3 C)   TempSrc:   Oral   SpO2: 99% 100% 100% 100%  Weight:      Height:       Physical Exam:   General: Lying in bed  Heart:s1s2 irregular Chest: clear R, dec'd L base Abdomen:soft, Non-tender, non-distended Extremities: trace to 1+ edema lower extremities Neuro talkative but confused - answers some questions Dialysis Access: Left upper extremity AV fistula has bruit and  thrill.  Medications: Infusions: .  prismasol BGK 4/2.5 400 mL/hr at 03/08/19 0005  .  prismasol BGK 4/2.5 300 mL/hr at 03/08/19 0402  . sodium chloride    . sodium chloride Stopped (02/27/19 1901)  . sodium chloride    . sodium chloride    . norepinephrine (LEVOPHED) Adult infusion 2 mcg/min (03/08/19 0900)  . prismasol BGK 4/2.5 1,800 mL/hr at 03/08/19 9628    Scheduled Medications: . amiodarone  400 mg Oral BID  . aspirin EC  325 mg Oral Daily   Or  . aspirin  324 mg Per Tube Daily  . Chlorhexidine Gluconate Cloth  6 each Topical Q0600  . darbepoetin (ARANESP) injection - DIALYSIS  60 mcg Intravenous Q Tue-HD  . doxercalciferol  4 mcg Intravenous Q T,Th,Sa-HD  . enoxaparin (LOVENOX) injection  30 mg Subcutaneous Q48H  . feeding supplement (ENSURE ENLIVE)  237 mL Oral TID BM  . feeding supplement (NEPRO CARB STEADY)  1,000 mL Per Tube Q24H  . feeding supplement (PRO-STAT SUGAR FREE 64)  30 mL Per Tube TID  . insulin aspart  0-24 Units Subcutaneous Q4H  . levothyroxine  50 mcg Oral Q0600  . mouth rinse  15 mL Mouth Rinse BID  . metoprolol tartrate  25 mg Oral BID  . midodrine  10 mg Oral TID WC  . multivitamin  1 tablet Oral QHS  .  pantoprazole  40 mg Oral Daily  . rosuvastatin  40 mg Oral q1800  . sodium chloride flush  10-40 mL Intracatheter Q12H  . sodium chloride flush  3 mL Intravenous Q12H    have reviewed scheduled and prn medications.

## 2019-03-09 ENCOUNTER — Inpatient Hospital Stay (HOSPITAL_COMMUNITY): Payer: Medicare Other

## 2019-03-09 HISTORY — PX: IR THORACENTESIS ASP PLEURAL SPACE W/IMG GUIDE: IMG5380

## 2019-03-09 LAB — RENAL FUNCTION PANEL
Albumin: 2.1 g/dL — ABNORMAL LOW (ref 3.5–5.0)
Albumin: 2 g/dL — ABNORMAL LOW (ref 3.5–5.0)
Anion gap: 10 (ref 5–15)
Anion gap: 12 (ref 5–15)
BUN: 53 mg/dL — ABNORMAL HIGH (ref 8–23)
BUN: 68 mg/dL — ABNORMAL HIGH (ref 8–23)
CO2: 25 mmol/L (ref 22–32)
CO2: 25 mmol/L (ref 22–32)
Calcium: 8.1 mg/dL — ABNORMAL LOW (ref 8.9–10.3)
Calcium: 8 mg/dL — ABNORMAL LOW (ref 8.9–10.3)
Chloride: 102 mmol/L (ref 98–111)
Chloride: 100 mmol/L (ref 98–111)
Creatinine, Ser: 3.6 mg/dL — ABNORMAL HIGH (ref 0.61–1.24)
Creatinine, Ser: 4.33 mg/dL — ABNORMAL HIGH (ref 0.61–1.24)
GFR calc Af Amer: 17 mL/min — ABNORMAL LOW (ref >60)
GFR calc Af Amer: 14 mL/min — ABNORMAL LOW (ref >60)
GFR calc non Af Amer: 15 mL/min — ABNORMAL LOW (ref >60)
GFR calc non Af Amer: 12 mL/min — ABNORMAL LOW (ref >60)
Glucose, Bld: 158 mg/dL — ABNORMAL HIGH (ref 70–99)
Glucose, Bld: 160 mg/dL — ABNORMAL HIGH (ref 70–99)
Phosphorus: 2.5 mg/dL (ref 2.5–4.6)
Phosphorus: 2.8 mg/dL (ref 2.5–4.6)
Potassium: 3.7 mmol/L (ref 3.5–5.1)
Potassium: 3.6 mmol/L (ref 3.5–5.1)
Sodium: 137 mmol/L (ref 135–145)
Sodium: 137 mmol/L (ref 135–145)

## 2019-03-09 LAB — CBC
HCT: 29.1 % — ABNORMAL LOW (ref 39.0–52.0)
Hemoglobin: 8.2 g/dL — ABNORMAL LOW (ref 13.0–17.0)
MCH: 29.4 pg (ref 26.0–34.0)
MCHC: 28.2 g/dL — ABNORMAL LOW (ref 30.0–36.0)
MCV: 104.3 fL — ABNORMAL HIGH (ref 80.0–100.0)
Platelets: 126 10*3/uL — ABNORMAL LOW (ref 150–400)
RBC: 2.79 MIL/uL — ABNORMAL LOW (ref 4.22–5.81)
RDW: 18.8 % — ABNORMAL HIGH (ref 11.5–15.5)
WBC: 11.4 10*3/uL — ABNORMAL HIGH (ref 4.0–10.5)
nRBC: 1.9 % — ABNORMAL HIGH (ref 0.0–0.2)

## 2019-03-09 LAB — GLUCOSE, CAPILLARY
Glucose-Capillary: 127 mg/dL — ABNORMAL HIGH (ref 70–99)
Glucose-Capillary: 134 mg/dL — ABNORMAL HIGH (ref 70–99)
Glucose-Capillary: 147 mg/dL — ABNORMAL HIGH (ref 70–99)
Glucose-Capillary: 148 mg/dL — ABNORMAL HIGH (ref 70–99)
Glucose-Capillary: 151 mg/dL — ABNORMAL HIGH (ref 70–99)
Glucose-Capillary: 159 mg/dL — ABNORMAL HIGH (ref 70–99)
Glucose-Capillary: 161 mg/dL — ABNORMAL HIGH (ref 70–99)

## 2019-03-09 LAB — MAGNESIUM: Magnesium: 2.3 mg/dL (ref 1.7–2.4)

## 2019-03-09 MED ORDER — PRO-STAT SUGAR FREE PO LIQD
30.0000 mL | Freq: Two times a day (BID) | ORAL | Status: DC
  Administered 2019-03-09 – 2019-03-10 (×2): 30 mL
  Filled 2019-03-09 (×3): qty 30

## 2019-03-09 MED ORDER — B COMPLEX-C PO TABS
1.0000 | ORAL_TABLET | Freq: Every day | ORAL | Status: DC
  Filled 2019-03-09: qty 1

## 2019-03-09 MED ORDER — CHLORHEXIDINE GLUCONATE CLOTH 2 % EX PADS
6.0000 | MEDICATED_PAD | Freq: Every day | CUTANEOUS | Status: DC
  Administered 2019-03-09 – 2019-03-10 (×2): 6 via TOPICAL

## 2019-03-09 MED ORDER — AMIODARONE IV BOLUS ONLY 150 MG/100ML: 150.0000 mg | Freq: Once | INTRAVENOUS | Status: DC

## 2019-03-09 MED ORDER — DARBEPOETIN ALFA 60 MCG/0.3ML IJ SOSY
60.0000 ug | PREFILLED_SYRINGE | INTRAMUSCULAR | Status: DC
  Administered 2019-03-09: 11:00:00 60 ug via SUBCUTANEOUS
  Filled 2019-03-09: qty 0.3

## 2019-03-09 MED ORDER — MORPHINE SULFATE (PF) 2 MG/ML IV SOLN: 1.0000 mg | INTRAVENOUS | Status: DC | PRN

## 2019-03-09 MED ORDER — LIDOCAINE HCL (PF) 1 % IJ SOLN
INTRAMUSCULAR | Status: DC | PRN
  Administered 2019-03-09: 5 mL

## 2019-03-09 MED ORDER — AMIODARONE LOAD VIA INFUSION
150.0000 mg | Freq: Once | INTRAVENOUS | Status: AC
  Administered 2019-03-09: 09:00:00 150 mg via INTRAVENOUS
  Filled 2019-03-09: qty 83.34

## 2019-03-09 MED ORDER — METOPROLOL TARTRATE 25 MG PO TABS
25.0000 mg | ORAL_TABLET | Freq: Two times a day (BID) | ORAL | Status: DC
  Administered 2019-03-09 (×2): 25 mg via ORAL
  Filled 2019-03-09 (×2): qty 1

## 2019-03-09 MED ORDER — ALBUMIN HUMAN 25 % IV SOLN
25.0000 g | Freq: Four times a day (QID) | INTRAVENOUS | Status: AC
  Administered 2019-03-09 – 2019-03-10 (×2): 25 g via INTRAVENOUS
  Filled 2019-03-09 (×2): qty 100

## 2019-03-09 MED ORDER — NEPRO/CARBSTEADY PO LIQD
1000.0000 mL | ORAL | Status: DC
  Administered 2019-03-09: 1000 mL via ORAL

## 2019-03-09 MED ORDER — LIDOCAINE HCL 1 % IJ SOLN
INTRAMUSCULAR | Status: AC
  Filled 2019-03-09: qty 20

## 2019-03-09 NOTE — Progress Notes (Signed)
Assisted tele visit to patient with daughter.  Alver Sorrow, RN

## 2019-03-09 NOTE — Progress Notes (Signed)
Progress Note  Patient Name: Robert [name]. Date of Encounter: 03/09/2019  Primary Cardiologist: Shirlee More, MD   Subjective   Patient is less alert today. Went back into Afib with RVR last pm.   Inpatient Medications    Scheduled Meds: . aspirin EC  325 mg Oral Daily   Or  . aspirin  324 mg Per Tube Daily  . Chlorhexidine Gluconate Cloth  6 each Topical Q0600  . darbepoetin (ARANESP) injection - DIALYSIS  60 mcg Intravenous Q Tue-HD  . doxercalciferol  4 mcg Intravenous Q T,Th,Sa-HD  . enoxaparin (LOVENOX) injection  30 mg Subcutaneous Q48H  . feeding supplement (ENSURE ENLIVE)  237 mL Oral TID BM  . feeding supplement (NEPRO CARB STEADY)  1,000 mL Per Tube Q24H  . feeding supplement (PRO-STAT SUGAR FREE 64)  30 mL Per Tube TID  . insulin aspart  0-24 Units Subcutaneous Q4H  . levothyroxine  50 mcg Oral Q0600  . mouth rinse  15 mL Mouth Rinse BID  . metoprolol tartrate  25 mg Oral BID  . midodrine  10 mg Oral TID WC  . multivitamin  1 tablet Oral QHS  . pantoprazole  40 mg Oral Daily  . rosuvastatin  40 mg Oral q1800  . sodium chloride flush  10-40 mL Intracatheter Q12H  . sodium chloride flush  3 mL Intravenous Q12H   Continuous Infusions: .  prismasol BGK 4/2.5 400 mL/hr at 03/08/19 1200  .  prismasol BGK 4/2.5 300 mL/hr at 03/08/19 0402  . sodium chloride    . sodium chloride Stopped (02/27/19 1901)  . sodium chloride    . sodium chloride    . amiodarone 30 mg/hr (03/09/19 0700)  . amiodarone    . norepinephrine (LEVOPHED) Adult infusion 6 mcg/min (03/09/19 0700)  . prismasol BGK 4/2.5 1,800 mL/hr at 03/08/19 1200   PRN Meds: sodium chloride, sodium chloride, albuterol, alteplase, budesonide (PULMICORT) nebulizer solution, dextrose, heparin, heparin, lidocaine (PF), lidocaine-prilocaine, metoprolol tartrate, morphine injection, ondansetron (ZOFRAN) IV, pentafluoroprop-tetrafluoroeth, sodium chloride flush, sodium chloride flush   Vital Signs      Vitals:   03/09/19 0300 03/09/19 0315 03/09/19 0330 03/09/19 0400  BP: (!) 117/51 (!) 122/97 (!) 94/51   Pulse: (!) 128 (!) 142 74   Resp: (!) 21 (!) 30 (!) 30   Temp:    98.5 F (36.9 C)  TempSrc:    Oral  SpO2: 96% 97% 96%   Weight:      Height:        Intake/Output Summary (Last 24 hours) at 03/09/2019 0810 Last data filed at 03/09/2019 0700 Gross per 24 hour  Intake 781.54 ml  Output 1039 ml  Net -257.46 ml   Last 3 Weights 03/08/2019 03/08/2019 03/07/2019  Weight (lbs) 164 lb 7.4 oz 162 lb 4.1 oz 170 lb 13.7 oz  Weight (kg) 74.6 kg 73.6 kg 77.5 kg      Telemetry    Afib with RVR in 130s- Personally Reviewed  Physical Exam   GEN: Elderly WM, NAD Neck: No JVD, supple. Dialysis catheter in place Cardiac: IRRR- rapid, 2/6 systolic murmur RUSB; no DM Respiratory: clear GI: Soft, NT/ND MS: No edema Neuro: less responsive. Nonfocal.   Labs    High Sensitivity Troponin:   Recent Labs  Lab 02/09/2019 1736 02/11/2019 1922 02/03/2019 2200 01/26/2019 0124 02/10/2019 0955  TROPONINIHS 81* 856* 4,043* 4,270* 4,018*      Chemistry Recent Labs  Lab 03/05/19 1300 03/08/19 0444 03/08/19 1600 03/09/19  0426  NA  --  138 138 137  K  --  4.0 3.8 3.7  CL  --  99 101 102  CO2  --  26 26 25   GLUCOSE  --  151* 108* 158*  BUN  --  40* 36* 53*  CREATININE  --  2.80* 2.69* 3.60*  CALCIUM  --  8.1* 8.1* 8.1*  PROT 4.6*  --   --   --   ALBUMIN 2.0* 2.3* 2.2* 2.1*  AST 57*  --   --   --   ALT 47*  --   --   --   ALKPHOS 75  --   --   --   BILITOT 0.2*  --   --   --   GFRNONAA  --  20* 21* 15*  GFRAA  --  23* 25* 17*  ANIONGAP  --  13 11 10      Hematology Recent Labs  Lab 03/05/19 0442 03/07/19 1306 03/08/19 0444 03/09/19 0427  WBC 8.0  --  9.3 11.4*  RBC 3.12*  --  2.83* 2.79*  HGB 9.3* 10.2* 8.5* 8.2*  HCT 27.3* 30.0* 27.9* 29.1*  MCV 87.5  --  98.6 104.3*  MCH 29.8  --  30.0 29.4  MCHC 34.1  --  30.5 28.2*  RDW 19.5*  --  20.1* 18.8*  PLT 71*  --  110* 126*     Patient Profile     Robert [name]. is a 84 y.o. male with ESRD, HTN, CAD s/p PCI, Stroke s/p CEA, DM admitted 12/29 for new-onset Afib. Subsequently had NSTEMI and found to have 3vCAD. S/p CABG x 4 02/08/2019.  Note preoperative echocardiogram showed normal LV function, mild mitral regurgitation, moderate aortic stenosis.  Assessment & Plan    1.  Atrial fibrillation with RVR recurrent. Had converted post op on amiodarone. Now resumed amiodarone IV. Plan additional bolus this am. May need to consider DCCV if Afib persists.  On metoprolol. He did have LA clipping. [place] vasc score of 7. Recommend  Coumadin when okay with surgery. Still has chest tube in place. Unable to wean Levophed due to recurrent hypotension with Afib.   2.  NSTEMI/ with severe 3 vessel coronary artery disease status post coronary artery bypass graft-continue ASA  and Crestor 40 mg daily.  3.  Moderate aortic stenosis-follow-up as an outpatient. May need TAVR in the future.  4.  End-stage renal disease-dialysis per nephrology.  5.  History of stroke status post carotid endarterectomy.  6.  HTN. BP is low- on pressors now with Levophed.   7. HLD on high dose statin.  For questions or updates, please contact [facility] Please consult [email or web address] for contact info under        Signed, [place]on [place], MD  03/09/2019, 8:10 AM

## 2019-03-09 NOTE — Progress Notes (Signed)
  Speech Language Pathology Treatment: Dysphagia  Patient Details Name: Robert [name]. MRN: [id number] DOB: [birth year] Today's Date: 03/09/2019 Time: 4818-5631 SLP Time Calculation (min) (ACUTE ONLY): 19 min  Assessment / Plan / Recommendation Clinical Impression  Goal has been to objectively assess pt's swallow with MBS. From medical standpoint either he has not been appropriate or in a procedure. Seen this morning and at present, po's shouldn't be a consideration given respiratory, cardiac and alertness view. He is awake and cooperative to allow oral care to remove dried secretions from mouth breathing, npo status and HFNC 10 L. Heart rate in 120-130's and respiratory rate low 30's. Pt's respiratory/deglutory pattern is ill synchronized evidenced by inhalations immediately post swallow of 1/2 sips water with increased dyspnea. O2 sats in lower 90's (93%, once 88%). Delayed throat clears. He cannot elicit effective cough. Discussed with RN to continue oral care and allow several ice chips to keep mucosa moist, decreasing bacterial load- he needs this when appropriate. Sit upright, oral care and ice chips every 3-4 hours. Prognosis for swallow improvement dependent on overall medical status. This SLP has worked with him for several weeks and improvements in his delirium but unfortunately not in area of respiration/swallow. Will continue intervention for now. Would discussions with Palliative care be beneficial?   HPI HPI: Mr. Heal has PMH: stroke, 2018, GERD, esophageal dilation 03/18/2010, ESRD, DM, MI, CAD. The patient presented with generalized weakness and tachycardia. Underwent CABG x 4 12/30, intubawted 12/30-12/31. CXR significant for cardiomegaly with no acute changes. New onset A-fib. MBS 2018 following stroke recommending puree, thin (vocal cord penetration).        SLP Plan  Continue with current plan of care       Recommendations  Diet recommendations: NPO Medication  Administration: Via alternative means                Oral Care Recommendations: Oral care QID Follow up Recommendations: Skilled Nursing facility;24 hour supervision/assistance SLP Visit Diagnosis: Dysphagia, unspecified (R13.10) Plan: Continue with current plan of care       GO                Houston Siren 03/09/2019, 8:57 AM

## 2019-03-09 NOTE — Procedures (Signed)
PROCEDURE SUMMARY:  Successful US guided left thoracentesis. Yielded 1.2 L of dark bloody fluid. Pt tolerated procedure well. No immediate complications.  Specimen was sent for labs. CXR ordered.  EBL < 5 mL  Ascencion Dike PA-C 03/09/2019 11:37 AM

## 2019-03-09 NOTE — Progress Notes (Signed)
Physical Therapy Treatment Patient Details Name: Robert [name]. MRN: [id number] DOB: January 31, 1936 Today's Date: 03/09/2019    History of Present Illness Pt is an 84 y.o. male admitted 02/20/2019 for evaluation of new onset afib. S/p CABG x4 on 12/30 and clipping of atrial appendage. Underwent CRRT 1/10-1/11 with L femoral non-tunneled catheter. Plan to initiate HD 1/13. PMH includes ESRD on HD, HTN, DM2, CAD s/p DES x 3, stroke x2 (2017 and 2018) s/p loop recorder, CAD s/p right CEA 2018.   PT Comments    Pt seen for bed-level mobility secondary to L femoral non-tunneled catheter. Pt requiring min-maxA for mobility, limited by generalized weakness and impaired cognition. Pt following commands <25% of session, inconsistently questions with max cues (able to state he served in [employer], met his wife in high school and married 3 years). Will continue to follow acutely and progress activity as able. Anticipate pt will require extensive post-acute rehab to maximize functional mobility and independence.   Follow Up Recommendations  CIR;SNF(pending progress)     Equipment Recommendations  (TBD)    Recommendations for Other Services       Precautions / Restrictions Precautions Precautions: Fall;Other (comment);Sternal Precaution Booklet Issued: No Precaution Comments: L femoral non-tunneled cath    Mobility  Bed Mobility Overal bed mobility: Needs Assistance Bed Mobility: Rolling Rolling: Min assist;Max assist         General bed mobility comments: Bed level treatment secondary to non-tunneled femoral line. Pt able to roll onto R-side with minA, cues to use bed rail, automatically assisting with LEs well, but not to command. MaxA for repositioning in bed  Transfers                 General transfer comment: Unable to attempt due to non-tunneled femoral line  Ambulation/Gait                 Stairs             Wheelchair Mobility    Modified Rankin  (Stroke Patients Only)       Balance                                            Cognition Arousal/Alertness: Awake/alert Behavior During Therapy: Restless Overall Cognitive Status: No family/caregiver present to determine baseline cognitive functioning                                 General Comments: Pt alert with eyes open, attempting to get LEs out of bed. Responds to name but will not state name or answer orientation questions. 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 Follows <25% simple commands      Exercises      General Comments General comments (skin integrity, edema, etc.): HR sustaining 130s in afib, SpO2 95% on 10L HFNC, BP 131/55; RN present and aware      Pertinent Vitals/Pain Pain Assessment: Faces Faces Pain Scale: Hurts a little bit Pain Location: Generalized, unable to state where Pain Descriptors / Indicators: Grimacing Pain Intervention(s): Monitored during session;Repositioned    Home Living Family/patient expects to be discharged to:: Private residence Living Arrangements: Children  Prior Function            PT Goals (current goals can now be found in the care plan section) Progress towards PT goals: Not progressing toward goals - comment(Limited by current status, femoral line)    Frequency    Min 3X/week      PT Plan Discharge plan needs to be updated    Co-evaluation              AM-PAC PT "6 Clicks" Mobility   Outcome Measure  Help needed turning from your back to your side while in a flat bed without using bedrails?: A Lot Help needed moving from lying on your back to sitting on the side of a flat bed without using bedrails?: A Lot Help needed moving to and from a bed to a chair (including a wheelchair)?: A Lot Help needed standing up from a chair using your arms (e.g., wheelchair  or bedside chair)?: A Lot Help needed to walk in hospital room?: A Lot Help needed climbing 3-5 steps with a railing? : Total 6 Click Score: 11    End of Session Equipment Utilized During Treatment: Oxygen Activity Tolerance: Patient tolerated treatment well;Other (comment);Treatment limited secondary to medical complications (Comment)(Limited by cognitive status) Patient left: in bed;with call bell/phone within reach;with bed alarm set Nurse Communication: Mobility status PT Visit Diagnosis: Unsteadiness on feet (R26.81);Muscle weakness (generalized) (M62.81)     Time: 7159-5396 PT Time Calculation (min) (ACUTE ONLY): 17 min  Charges:  $Therapeutic Activity: 8-22 mins                    Mabeline Caras, PT, DPT Acute Rehabilitation Services  Pager [phone] Office [place] 03/09/2019, 5:00 PM

## 2019-03-09 NOTE — Progress Notes (Signed)
[name] NEPHROLOGY PROGRESS NOTE  Assessment/ Plan: Pt is a 84 y.o. yo male with HTN, CAD, stroke, DM, admitted with an NSTEMI and found to have three-vessel CAD status post CABG on 12/30.  Dialysis: [facility] 4h   400/800    73.5kg    3/2.25Ca bath  P4  Hep none L AVF Hectorol 4 Venofer 50 Mircera 75 (last 12/15)  Problems: #CAD/NSTEMI: LHC with severe multivessel CAD status post CABG.  Cardiology, CT surgery following.  # ESRD, HD per TTS schedule normally, changed to CRRT 1/10- 1/11 for hypotension. Plan regular HD tomorrow off schedule. Has moderate hip edema, 2kg over dry wt but has lost weight here.   # Acute hypoxic resp failure - better, has L chest tube for large effusion   # Anemia due to CKD: Received PRBC postop.  Aranesp weekly on Tuesdays - note was started 1/5 here   # Secondary hyperparathyroidism: Started hectorol.  Discontinue binders - starting CRRT  # hypotension: note on regimen for his afib.  Added midodrine 10 tid to support UF and patient is on levo  #Paroxysmal A. fib: On amiodarone.  Anticoagulation per primary team  #AMS, agitation: please avoid morphine given ESRD patient - will exacerbate AMS   Sol Blazing 03/08/2019,10:08 AM   Subjective:  Wt's down, UF net 1.1 L yest   Objective Vital signs in last 24 hours: Vitals:   03/09/19 0315 03/09/19 0330 03/09/19 0400 03/09/19 0800  BP: (!) 122/97 (!) 94/51  (!) 106/92  Pulse: (!) 142 74  (!) 135  Resp: (!) 30 (!) 30  (!) 23  Temp:   98.5 F (36.9 C) 98.5 F (36.9 C)  TempSrc:   Oral Oral  SpO2: 97% 96%  94%  Weight:      Height:       Physical Exam:   General: Lying in bed , responsive Heart:s1s2 irregular Chest: clear R, dec'd L base Abdomen:soft, Non-tender, non-distended Extremities: mild-mod hip edema  Neuro talkative but confused - answers some questions Dialysis Access: Left upper extremity AV fistula has bruit and thrill.  Medications: Infusions: .   prismasol BGK 4/2.5 400 mL/hr at 03/08/19 1200  .  prismasol BGK 4/2.5 300 mL/hr at 03/08/19 0402  . sodium chloride    . sodium chloride Stopped (02/27/19 1901)  . sodium chloride    . sodium chloride    . amiodarone 30 mg/hr (03/09/19 0700)  . norepinephrine (LEVOPHED) Adult infusion 6 mcg/min (03/09/19 0700)  . prismasol BGK 4/2.5 1,800 mL/hr at 03/08/19 1200    Scheduled Medications: . amiodarone  150 mg Intravenous Once  . aspirin EC  325 mg Oral Daily   Or  . aspirin  324 mg Per Tube Daily  . Chlorhexidine Gluconate Cloth  6 each Topical Q0600  . darbepoetin (ARANESP) injection - DIALYSIS  60 mcg Intravenous Q Tue-HD  . doxercalciferol  4 mcg Intravenous Q T,Th,Sa-HD  . enoxaparin (LOVENOX) injection  30 mg Subcutaneous Q48H  . feeding supplement (ENSURE ENLIVE)  237 mL Oral TID BM  . feeding supplement (NEPRO CARB STEADY)  1,000 mL Per Tube Q24H  . feeding supplement (PRO-STAT SUGAR FREE 64)  30 mL Per Tube TID  . insulin aspart  0-24 Units Subcutaneous Q4H  . levothyroxine  50 mcg Oral Q0600  . mouth rinse  15 mL Mouth Rinse BID  . metoprolol tartrate  25 mg Oral BID  . midodrine  10 mg Oral TID WC  . multivitamin  1 tablet Oral QHS  . pantoprazole  40 mg Oral Daily  . rosuvastatin  40 mg Oral q1800  . sodium chloride flush  10-40 mL Intracatheter Q12H  . sodium chloride flush  3 mL Intravenous Q12H    have reviewed scheduled and prn medications.

## 2019-03-09 NOTE — Progress Notes (Addendum)
Nutrition Follow-up  DOCUMENTATION CODES:   Non-severe (moderate) malnutrition in context of chronic illness  INTERVENTION:   -D/C Ensure, Magic Cup, and rena-vit -Add b complex with Vitamin C as it can be administered via tube  Change tube feeding:  -Nepro @ 45 ml/hr x 24 hr via Cortrak (1080 ml) -30 ml Prostat BID  Provides: 2144 kcals, 117 grams protein, 785 ml free water.   NUTRITION DIAGNOSIS:   Moderate Malnutrition related to chronic illness(ESRD/HD, CAD,) as evidenced by moderate fat depletion, moderate muscle depletion.  Ongoing  GOAL:   Patient will meet greater than or equal to 90% of their needs  Addressed via TF  MONITOR:   TF tolerance, Diet advancement, Labs, Weight trends, Skin  REASON FOR ASSESSMENT:   Consult Enteral/tube feeding initiation and management  ASSESSMENT:   84 yo admitted with NSTEMI, found to have 3 vessel CAD and underwent CABG x 4. PMH includes ESRD on HD, HTN, CAD, stroke, DM  1/5 s/p gastric Cortrak 1/7 tube feeding discontinued in AM 1/7 nocturnal TF support initiated 1/8 tube feeding held per MD, encouraging PO intake 1/11- stop CRRT 1/12- s/p thoracentesis- 1.2 L drained    RD working remotely.  Pt off CRRT. Plan to start HD tomorrow.   Per SLP, pt had slight improvement with delirium but not in swallowing function. Recommends for him to be kept NPO. SLP unsure if swallow function will improve. Palliative to determine GOC? Per RN, night shift RN reported pt "vomited" but looked to be excessive secretion vs TF. Will transition pt to continuous TF to meet 100% of needs.   Of note: diet order says full liquid. SLP messaged TCTS to change back to NPO.   EDW 73.5 kg Current weight: 74.6 kg (noted hip edema)  I/O: +2,399 ml since admit CRRT: 1,193 ml x 24 hrs   Drips: levophed, amiodarone  Medications: aranesp, hectorol, SS novolog, rena-vit Labs: CBG 147-161  Diet Order:   Diet Order            Diet full liquid  Room service appropriate? Yes; Fluid consistency: Thin  Diet effective now              EDUCATION NEEDS:   Not appropriate for education at this time  Skin:  Skin Assessment: Skin Integrity Issues: Skin Integrity Issues:: Incisions Incisions: R/L leg, L arm, chest  Last BM:  1/12  Height:   Ht Readings from Last 1 Encounters:  02/05/2019 5\' 8"  (1.727 m)    Weight:   Wt Readings from Last 1 Encounters:  03/08/19 74.6 kg    Ideal Body Weight:  70 kg  BMI:  Body mass index is 25.01 kg/m.  Estimated Nutritional Needs:   Kcal:  3295-1884 kcals  Protein:  92-110 g  Fluid:  1000 mL plus UOP   Mariana Single RD, LDN Clinical Nutrition Pager # [phone]

## 2019-03-09 NOTE — Progress Notes (Signed)
13 Days Post-Op Procedure(s) (LRB): CORONARY ARTERY BYPASS GRAFTING (CABG) using endoscopic greater saphenous vein harvest: SVC to Diag1; SVC to OM1; SVC to PDA and LIMA to LAD. (N/A) Clipping Of Atrial Appendage using 45 MM AtriCure AtriClip (N/A) Subjective: Responsive and calm at present, oriented to person  Objective: Vital signs in last 24 hours: Temp:  [97.5 F (36.4 C)-99 F (37.2 C)] 98.5 F (36.9 C) (01/12 0400) Pulse Rate:  [36-152] 74 (01/12 0330) Cardiac Rhythm: Atrial fibrillation (01/12 0400) Resp:  [16-38] 30 (01/12 0330) BP: (69-138)/(28-107) 94/51 (01/12 0330) SpO2:  [87 %-100 %] 96 % (01/12 0330) Weight:  [74.6 kg] 74.6 kg (01/11 1207)  Hemodynamic parameters for last 24 hours:    Intake/Output from previous day: 01/11 0701 - 01/12 0700 In: 813.4 [I.V.:413.4; NG/GT:400] Out: 7782 [Emesis/NG output:1] Intake/Output this shift: No intake/output data recorded.  General appearance: cooperative and no distress Neurologic: no focal deficit Heart: irregularly irregular rhythm Lungs: diminished breath sounds on left Abdomen: normal findings: soft, non-tender Wound: minimal drainage  Lab Results: Recent Labs    03/08/19 0444 03/09/19 0427  WBC 9.3 11.4*  HGB 8.5* 8.2*  HCT 27.9* 29.1*  PLT 110* 126*   BMET:  Recent Labs    03/08/19 1600 03/09/19 0426  NA 138 137  K 3.8 3.7  CL 101 102  CO2 26 25  GLUCOSE 108* 158*  BUN 36* 53*  CREATININE 2.69* 3.60*  CALCIUM 8.1* 8.1*    PT/INR: No results for input(s): LABPROT, INR in the last 72 hours. ABG    Component Value Date/Time   PHART 7.323 (L) 02/25/2019 1150   HCO3 16.8 (L) 02/25/2019 1150   TCO2 30 03/07/2019 1306   ACIDBASEDEF 9.0 (H) 02/25/2019 1150   O2SAT 90.0 02/25/2019 1150   CBG (last 3)  Recent Labs    03/08/19 1954 03/08/19 2350 03/09/19 0355  GLUCAP 147* 163* 148*    Assessment/Plan: S/P Procedure(s) (LRB): CORONARY ARTERY BYPASS GRAFTING (CABG) using endoscopic greater  saphenous vein harvest: SVC to Diag1; SVC to OM1; SVC to PDA and LIMA to LAD. (N/A) Clipping Of Atrial Appendage using 45 MM AtriCure AtriClip (N/A) -CV- went back into AF with RVR last night, IV amiodarone restarted. Still in 130s- d/w Dr. [place]- will rebolus with amio  On levophed for hypotension- wena when HR controlled  RESP- left pleural effusion- likely posterior- for IR thoracentesis today  RENAL- ESRD- CVVH stopped yesterday. Near baseline weight, plan per Nephrology  GI- on full liquids  ENDO- CBG mildly elevated, continue SSI  On synthroid  Deconditioning- severe, PT/OT limited so far- continue as tolerated   LOS: 14 days    Melrose Nakayama 03/09/2019

## 2019-03-09 NOTE — Progress Notes (Signed)
CT Surgery PM Rounds  Resting comfortably L thoracentesis 1L bloody fluid  Drained Creat back up tp 4.3, K 3.6 HD planned for tomorrow by renal Cont on iv amio for afib, titrate norepi for BP

## 2019-03-10 ENCOUNTER — Inpatient Hospital Stay (HOSPITAL_COMMUNITY): Payer: Medicare Other

## 2019-03-10 ENCOUNTER — Encounter (HOSPITAL_COMMUNITY)
Admission: EM | Disposition: E | Payer: Self-pay | Source: Home / Self Care | Attending: Thoracic Surgery (Cardiothoracic Vascular Surgery)

## 2019-03-10 ENCOUNTER — Inpatient Hospital Stay (HOSPITAL_COMMUNITY): Payer: Medicare Other | Admitting: Anesthesiology

## 2019-03-10 ENCOUNTER — Encounter (HOSPITAL_COMMUNITY): Payer: Self-pay | Admitting: Thoracic Surgery (Cardiothoracic Vascular Surgery)

## 2019-03-10 DIAGNOSIS — R6521 Severe sepsis with septic shock: Secondary | ICD-10-CM

## 2019-03-10 DIAGNOSIS — J9602 Acute respiratory failure with hypercapnia: Secondary | ICD-10-CM

## 2019-03-10 DIAGNOSIS — N186 End stage renal disease: Secondary | ICD-10-CM

## 2019-03-10 DIAGNOSIS — A419 Sepsis, unspecified organism: Secondary | ICD-10-CM

## 2019-03-10 DIAGNOSIS — Z992 Dependence on renal dialysis: Secondary | ICD-10-CM

## 2019-03-10 HISTORY — PX: APPLICATION OF WOUND VAC: SHX5189

## 2019-03-10 HISTORY — PX: COLECTOMY: SHX59

## 2019-03-10 HISTORY — PX: LAPAROTOMY: SHX154

## 2019-03-10 LAB — PROTIME-INR
INR: 3.1 — ABNORMAL HIGH (ref 0.8–1.2)
INR: 3.1 — ABNORMAL HIGH (ref 0.8–1.2)
Prothrombin Time: 31.9 seconds — ABNORMAL HIGH (ref 11.4–15.2)
Prothrombin Time: 31.5 seconds — ABNORMAL HIGH (ref 11.4–15.2)

## 2019-03-10 LAB — POCT I-STAT 7, (LYTES, BLD GAS, ICA,H+H)
Acid-base deficit: 1 mmol/L (ref 0.0–2.0)
Acid-base deficit: 12 mmol/L — ABNORMAL HIGH (ref 0.0–2.0)
Acid-base deficit: 15 mmol/L — ABNORMAL HIGH (ref 0.0–2.0)
Acid-base deficit: 17 mmol/L — ABNORMAL HIGH (ref 0.0–2.0)
Acid-base deficit: 5 mmol/L — ABNORMAL HIGH (ref 0.0–2.0)
Acid-base deficit: 5 mmol/L — ABNORMAL HIGH (ref 0.0–2.0)
Acid-base deficit: 7 mmol/L — ABNORMAL HIGH (ref 0.0–2.0)
Bicarbonate: 12.7 mmol/L — ABNORMAL LOW (ref 20.0–28.0)
Bicarbonate: 12.7 mmol/L — ABNORMAL LOW (ref 20.0–28.0)
Bicarbonate: 15.6 mmol/L — ABNORMAL LOW (ref 20.0–28.0)
Bicarbonate: 21.9 mmol/L (ref 20.0–28.0)
Bicarbonate: 22.1 mmol/L (ref 20.0–28.0)
Bicarbonate: 22.5 mmol/L (ref 20.0–28.0)
Bicarbonate: 23.9 mmol/L (ref 20.0–28.0)
Calcium, Ion: 0.83 mmol/L — CL (ref 1.15–1.40)
Calcium, Ion: 0.86 mmol/L — CL (ref 1.15–1.40)
Calcium, Ion: 0.93 mmol/L — ABNORMAL LOW (ref 1.15–1.40)
Calcium, Ion: 1.05 mmol/L — ABNORMAL LOW (ref 1.15–1.40)
Calcium, Ion: 1.09 mmol/L — ABNORMAL LOW (ref 1.15–1.40)
Calcium, Ion: 1.12 mmol/L — ABNORMAL LOW (ref 1.15–1.40)
Calcium, Ion: 1.19 mmol/L (ref 1.15–1.40)
HCT: 17 % — ABNORMAL LOW (ref 39.0–52.0)
HCT: 20 % — ABNORMAL LOW (ref 39.0–52.0)
HCT: 22 % — ABNORMAL LOW (ref 39.0–52.0)
HCT: 23 % — ABNORMAL LOW (ref 39.0–52.0)
HCT: 27 % — ABNORMAL LOW (ref 39.0–52.0)
HCT: 29 % — ABNORMAL LOW (ref 39.0–52.0)
HCT: 29 % — ABNORMAL LOW (ref 39.0–52.0)
Hemoglobin: 5.8 g/dL — CL (ref 13.0–17.0)
Hemoglobin: 6.8 g/dL — CL (ref 13.0–17.0)
Hemoglobin: 7.5 g/dL — ABNORMAL LOW (ref 13.0–17.0)
Hemoglobin: 7.8 g/dL — ABNORMAL LOW (ref 13.0–17.0)
Hemoglobin: 9.2 g/dL — ABNORMAL LOW (ref 13.0–17.0)
Hemoglobin: 9.9 g/dL — ABNORMAL LOW (ref 13.0–17.0)
Hemoglobin: 9.9 g/dL — ABNORMAL LOW (ref 13.0–17.0)
O2 Saturation: 100 %
O2 Saturation: 78 %
O2 Saturation: 88 %
O2 Saturation: 90 %
O2 Saturation: 95 %
O2 Saturation: 99 %
O2 Saturation: 99 %
Patient temperature: 101
Patient temperature: 34.3
Patient temperature: 35.3
Patient temperature: 35.4
Patient temperature: 99.6
Potassium: 3.4 mmol/L — ABNORMAL LOW (ref 3.5–5.1)
Potassium: 3.7 mmol/L (ref 3.5–5.1)
Potassium: 3.7 mmol/L (ref 3.5–5.1)
Potassium: 4.2 mmol/L (ref 3.5–5.1)
Potassium: 4.2 mmol/L (ref 3.5–5.1)
Potassium: 4.2 mmol/L (ref 3.5–5.1)
Potassium: 4.4 mmol/L (ref 3.5–5.1)
Sodium: 133 mmol/L — ABNORMAL LOW (ref 135–145)
Sodium: 135 mmol/L (ref 135–145)
Sodium: 137 mmol/L (ref 135–145)
Sodium: 137 mmol/L (ref 135–145)
Sodium: 140 mmol/L (ref 135–145)
Sodium: 145 mmol/L (ref 135–145)
Sodium: 147 mmol/L — ABNORMAL HIGH (ref 135–145)
TCO2: 14 mmol/L — ABNORMAL LOW (ref 22–32)
TCO2: 14 mmol/L — ABNORMAL LOW (ref 22–32)
TCO2: 17 mmol/L — ABNORMAL LOW (ref 22–32)
TCO2: 24 mmol/L (ref 22–32)
TCO2: 24 mmol/L (ref 22–32)
TCO2: 24 mmol/L (ref 22–32)
TCO2: 25 mmol/L (ref 22–32)
pCO2 arterial: 35 mmHg (ref 32.0–48.0)
pCO2 arterial: 40.2 mmHg (ref 32.0–48.0)
pCO2 arterial: 42.5 mmHg (ref 32.0–48.0)
pCO2 arterial: 44.8 mmHg (ref 32.0–48.0)
pCO2 arterial: 50.2 mmHg — ABNORMAL HIGH (ref 32.0–48.0)
pCO2 arterial: 54.7 mmHg — ABNORMAL HIGH (ref 32.0–48.0)
pCO2 arterial: 61.9 mmHg — ABNORMAL HIGH (ref 32.0–48.0)
pH, Arterial: 7.051 — CL (ref 7.350–7.450)
pH, Arterial: 7.159 — CL (ref 7.350–7.450)
pH, Arterial: 7.168 — CL (ref 7.350–7.450)
pH, Arterial: 7.181 — CL (ref 7.350–7.450)
pH, Arterial: 7.221 — ABNORMAL LOW (ref 7.350–7.450)
pH, Arterial: 7.242 — ABNORMAL LOW (ref 7.350–7.450)
pH, Arterial: 7.364 (ref 7.350–7.450)
pO2, Arterial: 102 mmHg (ref 83.0–108.0)
pO2, Arterial: 172 mmHg — ABNORMAL HIGH (ref 83.0–108.0)
pO2, Arterial: 196 mmHg — ABNORMAL HIGH (ref 83.0–108.0)
pO2, Arterial: 322 mmHg — ABNORMAL HIGH (ref 83.0–108.0)
pO2, Arterial: 57 mmHg — ABNORMAL LOW (ref 83.0–108.0)
pO2, Arterial: 60 mmHg — ABNORMAL LOW (ref 83.0–108.0)
pO2, Arterial: 64 mmHg — ABNORMAL LOW (ref 83.0–108.0)

## 2019-03-10 LAB — CBC
HCT: 31.5 % — ABNORMAL LOW (ref 39.0–52.0)
HCT: 24.8 % — ABNORMAL LOW (ref 39.0–52.0)
HCT: 25.7 % — ABNORMAL LOW (ref 39.0–52.0)
HCT: 27.4 % — ABNORMAL LOW (ref 39.0–52.0)
Hemoglobin: 8.8 g/dL — ABNORMAL LOW (ref 13.0–17.0)
Hemoglobin: 7 g/dL — ABNORMAL LOW (ref 13.0–17.0)
Hemoglobin: 7.3 g/dL — ABNORMAL LOW (ref 13.0–17.0)
Hemoglobin: 8 g/dL — ABNORMAL LOW (ref 13.0–17.0)
MCH: 29.6 pg (ref 26.0–34.0)
MCH: 30.6 pg (ref 26.0–34.0)
MCH: 29.8 pg (ref 26.0–34.0)
MCH: 29.6 pg (ref 26.0–34.0)
MCHC: 27.9 g/dL — ABNORMAL LOW (ref 30.0–36.0)
MCHC: 28.2 g/dL — ABNORMAL LOW (ref 30.0–36.0)
MCHC: 28.4 g/dL — ABNORMAL LOW (ref 30.0–36.0)
MCHC: 29.2 g/dL — ABNORMAL LOW (ref 30.0–36.0)
MCV: 106.1 fL — ABNORMAL HIGH (ref 80.0–100.0)
MCV: 108.3 fL — ABNORMAL HIGH (ref 80.0–100.0)
MCV: 104.9 fL — ABNORMAL HIGH (ref 80.0–100.0)
MCV: 101.5 fL — ABNORMAL HIGH (ref 80.0–100.0)
Platelets: 151 10*3/uL (ref 150–400)
Platelets: UNDETERMINED 10*3/uL (ref 150–400)
Platelets: 62 10*3/uL — ABNORMAL LOW (ref 150–400)
Platelets: 93 10*3/uL — ABNORMAL LOW (ref 150–400)
RBC: 2.97 MIL/uL — ABNORMAL LOW (ref 4.22–5.81)
RBC: 2.29 MIL/uL — ABNORMAL LOW (ref 4.22–5.81)
RBC: 2.45 MIL/uL — ABNORMAL LOW (ref 4.22–5.81)
RBC: 2.7 MIL/uL — ABNORMAL LOW (ref 4.22–5.81)
RDW: 18.4 % — ABNORMAL HIGH (ref 11.5–15.5)
RDW: 18.1 % — ABNORMAL HIGH (ref 11.5–15.5)
RDW: 19 % — ABNORMAL HIGH (ref 11.5–15.5)
RDW: 18.9 % — ABNORMAL HIGH (ref 11.5–15.5)
WBC: 12.9 10*3/uL — ABNORMAL HIGH (ref 4.0–10.5)
WBC: 9.2 10*3/uL (ref 4.0–10.5)
WBC: 9.1 10*3/uL (ref 4.0–10.5)
WBC: 15.1 10*3/uL — ABNORMAL HIGH (ref 4.0–10.5)
nRBC: 5.8 % — ABNORMAL HIGH (ref 0.0–0.2)
nRBC: 26.9 % — ABNORMAL HIGH (ref 0.0–0.2)
nRBC: 22.3 % — ABNORMAL HIGH (ref 0.0–0.2)
nRBC: 16.7 % — ABNORMAL HIGH (ref 0.0–0.2)

## 2019-03-10 LAB — GLUCOSE, CAPILLARY
Glucose-Capillary: 112 mg/dL — ABNORMAL HIGH (ref 70–99)
Glucose-Capillary: 58 mg/dL — ABNORMAL LOW (ref 70–99)
Glucose-Capillary: 94 mg/dL (ref 70–99)
Glucose-Capillary: 109 mg/dL — ABNORMAL HIGH (ref 70–99)
Glucose-Capillary: <10 mg/dL — CL (ref 70–99)
Glucose-Capillary: 117 mg/dL — ABNORMAL HIGH (ref 70–99)
Glucose-Capillary: 37 mg/dL — CL (ref 70–99)
Glucose-Capillary: 64 mg/dL — ABNORMAL LOW (ref 70–99)
Glucose-Capillary: 158 mg/dL — ABNORMAL HIGH (ref 70–99)
Glucose-Capillary: 190 mg/dL — ABNORMAL HIGH (ref 70–99)

## 2019-03-10 LAB — COOXEMETRY PANEL
Carboxyhemoglobin: 1.3 % (ref 0.5–1.5)
Carboxyhemoglobin: 1.3 % (ref 0.5–1.5)
Methemoglobin: 1.5 % (ref 0.0–1.5)
Methemoglobin: 1.8 % — ABNORMAL HIGH (ref 0.0–1.5)
O2 Saturation: 69 %
O2 Saturation: 73.9 %
Total hemoglobin: 8.4 g/dL — ABNORMAL LOW (ref 12.0–16.0)
Total hemoglobin: 7.8 g/dL — ABNORMAL LOW (ref 12.0–16.0)

## 2019-03-10 LAB — APTT: aPTT: 63 seconds — ABNORMAL HIGH (ref 24–36)

## 2019-03-10 LAB — RENAL FUNCTION PANEL
Albumin: 2 g/dL — ABNORMAL LOW (ref 3.5–5.0)
Albumin: 1.7 g/dL — ABNORMAL LOW (ref 3.5–5.0)
Anion gap: 18 — ABNORMAL HIGH (ref 5–15)
Anion gap: 29 — ABNORMAL HIGH (ref 5–15)
BUN: 82 mg/dL — ABNORMAL HIGH (ref 8–23)
BUN: 75 mg/dL — ABNORMAL HIGH (ref 8–23)
CO2: 21 mmol/L — ABNORMAL LOW (ref 22–32)
CO2: 14 mmol/L — ABNORMAL LOW (ref 22–32)
Calcium: 8 mg/dL — ABNORMAL LOW (ref 8.9–10.3)
Calcium: 8.4 mg/dL — ABNORMAL LOW (ref 8.9–10.3)
Chloride: 99 mmol/L (ref 98–111)
Chloride: 101 mmol/L (ref 98–111)
Creatinine, Ser: 5.29 mg/dL — ABNORMAL HIGH (ref 0.61–1.24)
Creatinine, Ser: 5.11 mg/dL — ABNORMAL HIGH (ref 0.61–1.24)
GFR calc Af Amer: 11 mL/min — ABNORMAL LOW (ref >60)
GFR calc Af Amer: 11 mL/min — ABNORMAL LOW (ref >60)
GFR calc non Af Amer: 9 mL/min — ABNORMAL LOW (ref >60)
GFR calc non Af Amer: 10 mL/min — ABNORMAL LOW (ref >60)
Glucose, Bld: 140 mg/dL — ABNORMAL HIGH (ref 70–99)
Glucose, Bld: 197 mg/dL — ABNORMAL HIGH (ref 70–99)
Phosphorus: 5.2 mg/dL — ABNORMAL HIGH (ref 2.5–4.6)
Phosphorus: 8.1 mg/dL — ABNORMAL HIGH (ref 2.5–4.6)
Potassium: 4.1 mmol/L (ref 3.5–5.1)
Potassium: 3.7 mmol/L (ref 3.5–5.1)
Sodium: 138 mmol/L (ref 135–145)
Sodium: 144 mmol/L (ref 135–145)

## 2019-03-10 LAB — LACTIC ACID, PLASMA
Lactic Acid, Venous: 7.5 mmol/L (ref 0.5–1.9)
Lactic Acid, Venous: >11 mmol/L (ref 0.5–1.9)

## 2019-03-10 LAB — FIBRINOGEN: Fibrinogen: 193 mg/dL — ABNORMAL LOW (ref 210–475)

## 2019-03-10 LAB — EXPECTORATED SPUTUM ASSESSMENT W GRAM STAIN, RFLX TO RESP C

## 2019-03-10 LAB — PREPARE RBC (CROSSMATCH)

## 2019-03-10 LAB — CORTISOL: Cortisol, Plasma: 33.3 ug/dL

## 2019-03-10 LAB — MAGNESIUM: Magnesium: 2.4 mg/dL (ref 1.7–2.4)

## 2019-03-10 SURGERY — LAPAROTOMY, EXPLORATORY
Anesthesia: General | Site: Abdomen

## 2019-03-10 MED ORDER — SODIUM CHLORIDE 0.9% IV SOLUTION: Freq: Once | INTRAVENOUS | Status: DC

## 2019-03-10 MED ORDER — HEPARIN BOLUS VIA INFUSION (CRRT)
1000.0000 [IU] | INTRAVENOUS | Status: DC | PRN
  Filled 2019-03-10: qty 1000

## 2019-03-10 MED ORDER — PRISMASOL BGK 4/2.5 32-4-2.5 MEQ/L REPLACEMENT SOLN: Status: DC

## 2019-03-10 MED ORDER — LEVOTHYROXINE SODIUM 50 MCG PO TABS
50.0000 ug | ORAL_TABLET | Freq: Every day | ORAL | Status: DC
  Administered 2019-03-10 – 2019-03-11 (×2): 50 ug
  Filled 2019-03-10 (×2): qty 1

## 2019-03-10 MED ORDER — HEPARIN BOLUS VIA INFUSION (CRRT): 1000.0000 [IU] | INTRAVENOUS | Status: DC | PRN

## 2019-03-10 MED ORDER — PROPOFOL 10 MG/ML IV BOLUS
INTRAVENOUS | Status: DC | PRN
  Administered 2019-03-10: 30 mg via INTRAVENOUS

## 2019-03-10 MED ORDER — ALBUMIN HUMAN 5 % IV SOLN: INTRAVENOUS | Status: DC | PRN

## 2019-03-10 MED ORDER — MIDAZOLAM HCL 2 MG/2ML IJ SOLN
2.0000 mg | INTRAMUSCULAR | Status: DC | PRN
  Administered 2019-03-11: 2 mg via INTRAVENOUS
  Filled 2019-03-10 (×2): qty 2

## 2019-03-10 MED ORDER — DOXERCALCIFEROL 4 MCG/2ML IV SOLN
4.0000 ug | INTRAVENOUS | Status: DC
  Administered 2019-03-11: 4 ug via INTRAVENOUS
  Filled 2019-03-10 (×3): qty 2

## 2019-03-10 MED ORDER — ALBUMIN HUMAN 5 % IV SOLN
25.0000 g | Freq: Once | INTRAVENOUS | Status: AC
  Administered 2019-03-10: 09:00:00 25 g via INTRAVENOUS

## 2019-03-10 MED ORDER — 0.9 % SODIUM CHLORIDE (POUR BTL) OPTIME
TOPICAL | Status: DC | PRN
Start: 2019-03-10 — End: 2019-03-10
  Administered 2019-03-10 (×3): 2000 mL

## 2019-03-10 MED ORDER — SODIUM BICARBONATE 8.4 % IV SOLN
INTRAVENOUS | Status: DC | PRN
  Administered 2019-03-10 (×4): 50 meq via INTRAVENOUS

## 2019-03-10 MED ORDER — MIDAZOLAM HCL 2 MG/2ML IJ SOLN: 2.0000 mg | INTRAMUSCULAR | Status: DC | PRN

## 2019-03-10 MED ORDER — PHENYLEPHRINE HCL-NACL 10-0.9 MG/250ML-% IV SOLN
0.0000 ug/min | INTRAVENOUS | Status: DC
  Administered 2019-03-10: 20 ug/min via INTRAVENOUS
  Filled 2019-03-10: qty 250
  Filled 2019-03-10: qty 750

## 2019-03-10 MED ORDER — ALTEPLASE 2 MG IJ SOLR: 2.0000 mg | Freq: Once | INTRAMUSCULAR | Status: DC | PRN

## 2019-03-10 MED ORDER — EPHEDRINE SULFATE-NACL 50-0.9 MG/10ML-% IV SOSY
PREFILLED_SYRINGE | INTRAVENOUS | Status: DC | PRN
  Administered 2019-03-10: 15 mg via INTRAVENOUS

## 2019-03-10 MED ORDER — PHENYLEPHRINE HCL-NACL 10-0.9 MG/250ML-% IV SOLN
INTRAVENOUS | Status: AC
  Filled 2019-03-10: qty 250

## 2019-03-10 MED ORDER — SODIUM BICARBONATE 8.4 % IV SOLN
100.0000 meq | Freq: Once | INTRAVENOUS | Status: AC
  Administered 2019-03-10: 14:00:00 100 meq via INTRAVENOUS

## 2019-03-10 MED ORDER — SODIUM CHLORIDE 0.9 % IV SOLN
250.0000 [IU]/h | INTRAVENOUS | Status: DC
  Filled 2019-03-10: qty 2

## 2019-03-10 MED ORDER — SODIUM CHLORIDE 0.9 % IV SOLN
2.0000 g | Freq: Two times a day (BID) | INTRAVENOUS | Status: DC
  Administered 2019-03-11: 2 g via INTRAVENOUS
  Filled 2019-03-10 (×2): qty 2

## 2019-03-10 MED ORDER — SODIUM CHLORIDE 0.9 % IV SOLN: 250.0000 [IU]/h | INTRAVENOUS | Status: DC

## 2019-03-10 MED ORDER — DEXTROSE 5 % IV SOLN: INTRAVENOUS | Status: DC

## 2019-03-10 MED ORDER — SUCCINYLCHOLINE CHLORIDE 20 MG/ML IJ SOLN
INTRAMUSCULAR | Status: DC | PRN
  Administered 2019-03-10: 140 mg via INTRAVENOUS

## 2019-03-10 MED ORDER — ROSUVASTATIN CALCIUM 20 MG PO TABS: 40.0000 mg | ORAL_TABLET | Freq: Every day | ORAL | Status: DC

## 2019-03-10 MED ORDER — SODIUM BICARBONATE 8.4 % IV SOLN: 50.0000 meq | Freq: Once | INTRAVENOUS | Status: AC

## 2019-03-10 MED ORDER — SODIUM CHLORIDE 0.9 % IV SOLN
2.0000 g | Freq: Once | INTRAVENOUS | Status: AC
  Administered 2019-03-10: 2 g via INTRAVENOUS
  Filled 2019-03-10: qty 2

## 2019-03-10 MED ORDER — ALBUMIN HUMAN 5 % IV SOLN: 25.0000 g | Freq: Once | INTRAVENOUS | Status: AC

## 2019-03-10 MED ORDER — ORAL CARE MOUTH RINSE
15.0000 mL | OROMUCOSAL | Status: DC
  Administered 2019-03-10 – 2019-03-13 (×28): 15 mL via OROMUCOSAL

## 2019-03-10 MED ORDER — HYDROCORTISONE NA SUCCINATE PF 100 MG IJ SOLR
50.0000 mg | Freq: Four times a day (QID) | INTRAMUSCULAR | Status: DC
  Administered 2019-03-10 – 2019-03-13 (×10): 50 mg via INTRAVENOUS
  Filled 2019-03-10 (×10): qty 2

## 2019-03-10 MED ORDER — SODIUM BICARBONATE 8.4 % IV SOLN
INTRAVENOUS | Status: AC
  Filled 2019-03-10: qty 50

## 2019-03-10 MED ORDER — HEMOSTATIC AGENTS (NO CHARGE) OPTIME
TOPICAL | Status: DC | PRN
  Administered 2019-03-10: 1 via TOPICAL

## 2019-03-10 MED ORDER — B COMPLEX-C PO TABS
1.0000 | ORAL_TABLET | Freq: Every day | ORAL | Status: DC
  Administered 2019-03-10: 10:00:00 1
  Filled 2019-03-10 (×3): qty 1

## 2019-03-10 MED ORDER — MIDAZOLAM HCL 2 MG/2ML IJ SOLN
INTRAMUSCULAR | Status: AC
  Filled 2019-03-10: qty 2

## 2019-03-10 MED ORDER — PANTOPRAZOLE SODIUM 40 MG PO PACK
40.0000 mg | PACK | Freq: Every day | ORAL | Status: DC
  Administered 2019-03-10: 40 mg
  Filled 2019-03-10: qty 20

## 2019-03-10 MED ORDER — HEPARIN SODIUM (PORCINE) 5000 UNIT/ML IJ SOLN: 5000.0000 [IU] | Freq: Three times a day (TID) | INTRAMUSCULAR | Status: DC

## 2019-03-10 MED ORDER — VASOPRESSIN 20 UNIT/ML IV SOLN
INTRAVENOUS | Status: AC
  Filled 2019-03-10: qty 1

## 2019-03-10 MED ORDER — SODIUM BICARBONATE 8.4 % IV SOLN
INTRAVENOUS | Status: AC
  Administered 2019-03-10: 17:00:00 50 meq
  Filled 2019-03-10: qty 50

## 2019-03-10 MED ORDER — ROCURONIUM BROMIDE 10 MG/ML (PF) SYRINGE
PREFILLED_SYRINGE | INTRAVENOUS | Status: DC | PRN
  Administered 2019-03-10: 100 mg via INTRAVENOUS

## 2019-03-10 MED ORDER — ETOMIDATE 2 MG/ML IV SOLN
INTRAVENOUS | Status: DC | PRN
  Administered 2019-03-10: 10 mg via INTRAVENOUS

## 2019-03-10 MED ORDER — METRONIDAZOLE IN NACL 5-0.79 MG/ML-% IV SOLN
500.0000 mg | Freq: Three times a day (TID) | INTRAVENOUS | Status: DC
  Administered 2019-03-11 – 2019-03-13 (×7): 500 mg via INTRAVENOUS
  Filled 2019-03-10 (×8): qty 100

## 2019-03-10 MED ORDER — SODIUM BICARBONATE 8.4 % IV SOLN
100.0000 meq | Freq: Once | INTRAVENOUS | Status: AC
  Administered 2019-03-10: 100 meq via INTRAVENOUS
  Filled 2019-03-10: qty 50

## 2019-03-10 MED ORDER — NEPRO/CARBSTEADY PO LIQD: 1000.0000 mL | ORAL | Status: DC

## 2019-03-10 MED ORDER — SODIUM BICARBONATE-DEXTROSE 150-5 MEQ/L-% IV SOLN
150.0000 meq | INTRAVENOUS | Status: DC
  Administered 2019-03-10: 150 meq via INTRAVENOUS
  Filled 2019-03-10 (×2): qty 1000

## 2019-03-10 MED ORDER — VASOPRESSIN 20 UNIT/ML IV SOLN
INTRAVENOUS | Status: DC | PRN
  Administered 2019-03-10: 2 m[IU] via INTRAVENOUS

## 2019-03-10 MED ORDER — VANCOMYCIN HCL 750 MG/150ML IV SOLN: 750.0000 mg | INTRAVENOUS | Status: DC

## 2019-03-10 MED ORDER — SODIUM CHLORIDE 0.9 % IV SOLN
100.0000 mg | INTRAVENOUS | Status: DC
  Administered 2019-03-11 – 2019-03-12 (×2): 100 mg via INTRAVENOUS
  Filled 2019-03-10 (×4): qty 100

## 2019-03-10 MED ORDER — VASOPRESSIN 20 UNIT/ML IV SOLN
0.0400 [IU]/min | INTRAVENOUS | Status: DC
  Administered 2019-03-10 – 2019-03-13 (×5): 0.04 [IU]/min via INTRAVENOUS
  Filled 2019-03-10 (×7): qty 2

## 2019-03-10 MED ORDER — DEXTROSE-NACL 5-0.45 % IV SOLN: INTRAVENOUS | Status: DC

## 2019-03-10 MED ORDER — SODIUM CHLORIDE 0.9 % IV SOLN
2.0000 g | Freq: Two times a day (BID) | INTRAVENOUS | Status: DC
  Filled 2019-03-10: qty 2

## 2019-03-10 MED ORDER — DEXMEDETOMIDINE HCL IN NACL 400 MCG/100ML IV SOLN
0.4000 ug/kg/h | INTRAVENOUS | Status: DC
  Administered 2019-03-10: 06:00:00 0.4 ug/kg/h via INTRAVENOUS

## 2019-03-10 MED ORDER — FENTANYL 2500MCG IN NS 250ML (10MCG/ML) PREMIX INFUSION
0.0000 ug/h | INTRAVENOUS | Status: DC
  Administered 2019-03-10: 25 ug/h via INTRAVENOUS
  Administered 2019-03-12: 150 ug/h via INTRAVENOUS
  Filled 2019-03-10: qty 250
  Filled 2019-03-10: qty 500
  Filled 2019-03-10 (×2): qty 250

## 2019-03-10 MED ORDER — PROPOFOL 10 MG/ML IV BOLUS
INTRAVENOUS | Status: AC
  Filled 2019-03-10: qty 20

## 2019-03-10 MED ORDER — EPINEPHRINE 1 MG/10ML IJ SOSY
PREFILLED_SYRINGE | INTRAMUSCULAR | Status: DC | PRN
  Administered 2019-03-10: .1 mg via INTRAVENOUS
  Administered 2019-03-10: .05 mg via INTRAVENOUS
  Administered 2019-03-10 (×2): .1 mg via INTRAVENOUS
  Administered 2019-03-10: .05 mg via INTRAVENOUS

## 2019-03-10 MED ORDER — MIDAZOLAM HCL 2 MG/2ML IJ SOLN
INTRAMUSCULAR | Status: DC | PRN
  Administered 2019-03-10 (×2): 2 mg via INTRAVENOUS

## 2019-03-10 MED ORDER — HEPARIN SODIUM (PORCINE) 1000 UNIT/ML DIALYSIS
1000.0000 [IU] | INTRAMUSCULAR | Status: DC | PRN
  Administered 2019-03-12: 4000 [IU] via INTRAVENOUS_CENTRAL
  Filled 2019-03-10: qty 4
  Filled 2019-03-10 (×2): qty 6

## 2019-03-10 MED ORDER — PRISMASOL BGK 4/2.5 32-4-2.5 MEQ/L IV SOLN: INTRAVENOUS | Status: DC

## 2019-03-10 MED ORDER — ALBUMIN HUMAN 5 % IV SOLN
INTRAVENOUS | Status: AC
  Administered 2019-03-10: 07:00:00 25 g via INTRAVENOUS
  Filled 2019-03-10: qty 500

## 2019-03-10 MED ORDER — CALCIUM GLUCONATE-NACL 1-0.675 GM/50ML-% IV SOLN: 1.0000 g | Freq: Once | INTRAVENOUS | Status: DC

## 2019-03-10 MED ORDER — FENTANYL CITRATE (PF) 250 MCG/5ML IJ SOLN
INTRAMUSCULAR | Status: AC
  Filled 2019-03-10: qty 5

## 2019-03-10 MED ORDER — SODIUM BICARBONATE 8.4 % IV SOLN
50.0000 meq | Freq: Once | INTRAVENOUS | Status: AC
  Administered 2019-03-10: 06:00:00 50 meq via INTRAVENOUS
  Filled 2019-03-10: qty 50

## 2019-03-10 MED ORDER — HEPARIN SODIUM (PORCINE) 1000 UNIT/ML DIALYSIS: 1000.0000 [IU] | INTRAMUSCULAR | Status: DC | PRN

## 2019-03-10 MED ORDER — SODIUM CHLORIDE 0.9 % IV SOLN
200.0000 mg | Freq: Once | INTRAVENOUS | Status: AC
  Administered 2019-03-11: 200 mg via INTRAVENOUS
  Filled 2019-03-10: qty 200

## 2019-03-10 MED ORDER — LACTATED RINGERS IV BOLUS
500.0000 mL | Freq: Once | INTRAVENOUS | Status: AC
  Administered 2019-03-10: 500 mL via INTRAVENOUS

## 2019-03-10 MED ORDER — SODIUM CHLORIDE 0.9% IV SOLUTION: Freq: Once | INTRAVENOUS | Status: AC

## 2019-03-10 MED ORDER — EPINEPHRINE PF 1 MG/ML IJ SOLN
0.5000 ug/min | INTRAVENOUS | Status: AC
  Filled 2019-03-10: qty 4

## 2019-03-10 MED ORDER — MIDODRINE HCL 5 MG PO TABS
10.0000 mg | ORAL_TABLET | Freq: Three times a day (TID) | ORAL | Status: DC
  Administered 2019-03-10 (×2): 10 mg
  Filled 2019-03-10 (×2): qty 2

## 2019-03-10 MED ORDER — EPINEPHRINE PF 1 MG/ML IJ SOLN
0.5000 ug/min | INTRAVENOUS | Status: DC
  Administered 2019-03-10: 5 ug/min via INTRAVENOUS
  Administered 2019-03-11 (×2): 11 ug/min via INTRAVENOUS
  Administered 2019-03-12: 20 ug/min via INTRAVENOUS
  Administered 2019-03-12: 15 ug/min via INTRAVENOUS
  Administered 2019-03-12 – 2019-03-13 (×2): 20 ug/min via INTRAVENOUS
  Administered 2019-03-13: 15 ug/min via INTRAVENOUS
  Administered 2019-03-13: 20 ug/min via INTRAVENOUS
  Filled 2019-03-10 (×16): qty 4

## 2019-03-10 MED ORDER — CHLORHEXIDINE GLUCONATE CLOTH 2 % EX PADS
6.0000 | MEDICATED_PAD | Freq: Every day | CUTANEOUS | Status: DC
  Administered 2019-03-11 – 2019-03-12 (×2): 6 via TOPICAL

## 2019-03-10 MED ORDER — SODIUM BICARBONATE 8.4 % IV SOLN
INTRAVENOUS | Status: AC
  Administered 2019-03-10: 50 meq
  Filled 2019-03-10: qty 50

## 2019-03-10 MED ORDER — VANCOMYCIN HCL 750 MG/150ML IV SOLN
750.0000 mg | INTRAVENOUS | Status: DC
  Administered 2019-03-11: 06:00:00 750 mg via INTRAVENOUS
  Filled 2019-03-10: qty 150

## 2019-03-10 MED ORDER — SODIUM BICARBONATE 8.4 % IV SOLN
INTRAVENOUS | Status: AC
  Administered 2019-03-10: 50 meq
  Filled 2019-03-10: qty 100

## 2019-03-10 MED ORDER — VANCOMYCIN HCL 1500 MG/300ML IV SOLN
1500.0000 mg | Freq: Once | INTRAVENOUS | Status: AC
  Administered 2019-03-10: 1500 mg via INTRAVENOUS
  Filled 2019-03-10: qty 300

## 2019-03-10 MED ORDER — SODIUM BICARBONATE-DEXTROSE 150-5 MEQ/L-% IV SOLN
150.0000 meq | INTRAVENOUS | Status: DC
  Administered 2019-03-10 – 2019-03-11 (×5): 150 meq via INTRAVENOUS
  Filled 2019-03-10 (×8): qty 1000

## 2019-03-10 MED ORDER — SODIUM CHLORIDE 0.9 % FOR CRRT: INTRAVENOUS_CENTRAL | Status: DC | PRN

## 2019-03-10 MED ORDER — SODIUM CHLORIDE 0.9 % IV SOLN
2.0000 g | INTRAVENOUS | Status: DC
  Filled 2019-03-10: qty 2

## 2019-03-10 MED ORDER — CALCIUM CHLORIDE 10 % IV SOLN
INTRAVENOUS | Status: DC | PRN
  Administered 2019-03-10 (×3): 1 g via INTRAVENOUS

## 2019-03-10 MED ORDER — CHLORHEXIDINE GLUCONATE 0.12% ORAL RINSE (MEDLINE KIT)
15.0000 mL | Freq: Two times a day (BID) | OROMUCOSAL | Status: DC
  Administered 2019-03-10 – 2019-03-13 (×7): 15 mL via OROMUCOSAL

## 2019-03-10 MED ORDER — LACTATED RINGERS IV BOLUS
500.0000 mL | Freq: Once | INTRAVENOUS | Status: AC
  Administered 2019-03-10: 10:00:00 500 mL via INTRAVENOUS

## 2019-03-10 MED ORDER — ALBUTEROL SULFATE HFA 108 (90 BASE) MCG/ACT IN AERS
INHALATION_SPRAY | RESPIRATORY_TRACT | Status: DC | PRN
  Administered 2019-03-10: 6 via RESPIRATORY_TRACT

## 2019-03-10 MED ORDER — PHENYLEPHRINE CONCENTRATED 100MG/250ML (0.4 MG/ML) INFUSION SIMPLE
0.0000 ug/min | INTRAVENOUS | Status: DC
  Administered 2019-03-10 – 2019-03-11 (×4): 400 ug/min via INTRAVENOUS
  Administered 2019-03-11: 310 ug/min via INTRAVENOUS
  Administered 2019-03-11: 350 ug/min via INTRAVENOUS
  Administered 2019-03-12: 400 ug/min via INTRAVENOUS
  Administered 2019-03-12: 300 ug/min via INTRAVENOUS
  Administered 2019-03-12: 333.333 ug/min via INTRAVENOUS
  Administered 2019-03-12: 230 ug/min via INTRAVENOUS
  Administered 2019-03-13 (×2): 400 ug/min via INTRAVENOUS
  Filled 2019-03-10 (×16): qty 250

## 2019-03-10 MED ORDER — SODIUM CHLORIDE 0.9 % FOR CRRT
INTRAVENOUS_CENTRAL | Status: DC | PRN
  Filled 2019-03-10 (×3): qty 1000

## 2019-03-10 SURGICAL SUPPLY — 45 items
BLADE CLIPPER SURG (BLADE) IMPLANT
CANISTER SUCT 3000ML PPV (MISCELLANEOUS) ×3 IMPLANT
CANISTER WOUND CARE 500ML ATS (WOUND CARE) ×3 IMPLANT
COVER SURGICAL LIGHT HANDLE (MISCELLANEOUS) ×3 IMPLANT
COVER WAND RF STERILE (DRAPES) ×3 IMPLANT
DRAPE LAPAROSCOPIC ABDOMINAL (DRAPES) ×3 IMPLANT
DRAPE WARM FLUID 44X44 (DRAPES) ×3 IMPLANT
DRSG OPSITE POSTOP 4X10 (GAUZE/BANDAGES/DRESSINGS) IMPLANT
DRSG OPSITE POSTOP 4X8 (GAUZE/BANDAGES/DRESSINGS) IMPLANT
ELECT BLADE 6.5 EXT (BLADE) ×3 IMPLANT
ELECT CAUTERY BLADE 6.4 (BLADE) ×3 IMPLANT
ELECT REM PT RETURN 9FT ADLT (ELECTROSURGICAL) ×3
ELECTRODE REM PT RTRN 9FT ADLT (ELECTROSURGICAL) ×1 IMPLANT
GLOVE SURG SIGNA 7.5 PF LTX (GLOVE) ×3 IMPLANT
GOWN STRL REUS W/ TWL LRG LVL3 (GOWN DISPOSABLE) ×1 IMPLANT
GOWN STRL REUS W/ TWL XL LVL3 (GOWN DISPOSABLE) ×1 IMPLANT
GOWN STRL REUS W/TWL LRG LVL3 (GOWN DISPOSABLE) ×2
GOWN STRL REUS W/TWL XL LVL3 (GOWN DISPOSABLE) ×2
HANDLE SUCTION POOLE (INSTRUMENTS) ×1 IMPLANT
HEMOSTAT SURGICEL 2X14 (HEMOSTASIS) ×3 IMPLANT
KIT BASIN OR (CUSTOM PROCEDURE TRAY) ×3 IMPLANT
KIT TURNOVER KIT B (KITS) ×3 IMPLANT
LIGASURE IMPACT 36 18CM CVD LR (INSTRUMENTS) ×3 IMPLANT
NS IRRIG 1000ML POUR BTL (IV SOLUTION) ×6 IMPLANT
PACK GENERAL/GYN (CUSTOM PROCEDURE TRAY) ×3 IMPLANT
PAD ARMBOARD 7.5X6 YLW CONV (MISCELLANEOUS) ×3 IMPLANT
PENCIL SMOKE EVACUATOR (MISCELLANEOUS) ×3 IMPLANT
SLEEVE SUCTION 125 (MISCELLANEOUS) ×3 IMPLANT
SPECIMEN JAR LARGE (MISCELLANEOUS) IMPLANT
SPONGE ABD ABTHERA ADVANCE (MISCELLANEOUS) ×3 IMPLANT
SPONGE LAP 18X18 RF (DISPOSABLE) ×12 IMPLANT
STAPLER CUT CVD 40MM BLUE (STAPLE) ×3 IMPLANT
STAPLER PROXIMATE 75MM BLUE (STAPLE) ×3 IMPLANT
STAPLER VISISTAT 35W (STAPLE) ×3 IMPLANT
SUCTION POOLE HANDLE (INSTRUMENTS) ×3
SUT PDS AB 1 TP1 96 (SUTURE) ×6 IMPLANT
SUT SILK 2 0 SH CR/8 (SUTURE) ×9 IMPLANT
SUT SILK 2 0 TIES 10X30 (SUTURE) ×3 IMPLANT
SUT SILK 3 0 SH CR/8 (SUTURE) ×3 IMPLANT
SUT SILK 3 0 TIES 10X30 (SUTURE) ×3 IMPLANT
SUT VIC AB 3-0 SH 18 (SUTURE) IMPLANT
TOWEL GREEN STERILE (TOWEL DISPOSABLE) ×3 IMPLANT
TOWEL GREEN STERILE FF (TOWEL DISPOSABLE) ×3 IMPLANT
TRAY FOLEY MTR SLVR 16FR STAT (SET/KITS/TRAYS/PACK) IMPLANT
YANKAUER SUCT BULB TIP NO VENT (SUCTIONS) ×3 IMPLANT

## 2019-03-10 NOTE — Consult Note (Signed)
Anesthesiology Note:  R. Brachial art line inserted using ultrasound and sterile technique. 51/2 inch catheter inserted over wire. 2 needle passes required.  Roberts Gaudy, MD

## 2019-03-10 NOTE — Progress Notes (Signed)
Per Donne Hazel MD continue giving the total of 2 FFPs and add a unit of platelets after that. Labs sent per orders.  Hendrickson MD ordered to keep SBP greater than 100. Wean neosynephrine gtt first, try to keep epinephrine at 5, and then wean levophed if able to.

## 2019-03-10 NOTE — Progress Notes (Addendum)
Pt has epsiodes of emesis followed by increased respirations.  ABG was obtained and on call MD was paged.  See notes for orders.  Kathleene Hazel RN

## 2019-03-10 NOTE — Progress Notes (Signed)
Patient ID: Robert [name]., male   DOB: 12/20/1935, 84 y.o.   MRN: [id number]   This is an urgent consult on an 85 year old gentlemen now in acute distress, intubated, acidotic, on multiple pressors, on dialysis who is s/p a CABG on 12/30. On exam, he has a firm, diffusely tender abdomen. His labs show a lactic acid of 7.5. I suspect some ischemic intra-abdominal event or perforation. I discussed this with the patient's son.  I recommend an emergent exploratory laparotomy. I discussed the risks of bleeding, infection, the need for bowel resection, ostomy, further procedures, and death.  I believe he is too unstable for a CT scan. After the discussion, his son agrees with surgery. The case has been posted emergently.

## 2019-03-10 NOTE — Consult Note (Signed)
PULMONARY / CRITICAL CARE MEDICINE   NAME:  Robert Carrero., MRN:  [id number], DOB:  [birth year], LOS: 53 ADMISSION DATE:  02/07/2019, CONSULTATION DATE:  03/27/2019 REFERRING MD: Benedetto Coons ,MD CHIEF COMPLAINT:  Acute hypoxic respiratory failure BRIEF HISTORY:     Robert Coury. is a 84 y.o. male with medical history significant of ESRD on TTS dialysis, HTN, DM2, stroke s/p CEA, CAD s/p 3 stents who presented to ED for evaluation of tachycardia on 12/28.  Patient subsequently found to be in A. fib with RVR and have elevated troponin. Robert [name] is now s/p CABG and clipping of atrial appendage on 12/30. PCCM consulted to evaluate new oxygen and pressor requirement  after aspiration event.  HISTORY OF PRESENT ILLNESS   Per chart: Robert [name] is followed by Dr. Bettina Gavia. The patient has CAD s/p DES x 3, last PCI stent in 2011 to [place] (per chart review, no cath note in epic). Robert [name] had a stroke in 2018 and had a loop recorder implanted. Echo in 2018 showed EF 55-60%, G1DD, mild LVH, severe MAC, trace MR and TR.  His last Loop recorder check was 10/19/18 which did not show any arrhythmias. Robert [name] had a televisit with Dr. Rayann Heman 11/04/18 who explained loop recorder at RRT and patient preferred to leave it in and would call for removal in the future.   The patient presented to the ED 02/05/2019 for generalized weakness and tachycardia. The patient was in his normal state of health that morning. Robert [name] woke up and went to the ophthalmologist for a check-up and went home. At around 2 PM Robert [name] started feeling generally bad and lightheadedness. Robert [name] felt weak all over and shortness of breath. No chest pain. No fever, cough, or recent illness. No lower leg edema or orthopnea. Robert [name] called EMS and they reported Afib RVR rate 180. Robert [name] was given cardizem IV and HR improved to 140 bpm.   In the ED BP was 119/104, afebrile, RR 18, 98% O2. Labs showed potassium 3.8, glucose 233, calcium 9.5. WBC 5.5, Hgb 12.2. EKG showed Afib RVR, 159  bpm with likey rate related changes. CXR significant for cardiomegaly with no acute changes. HS troponin 81 and the second was 856. Robert [name] was given 5 mg of metoprolol and converted to NSR. Follow-up EKG showed NST 70 bpm with PAC, and minimal ST depression V5 and II.  The patient was admitted for further observation.      SIGNIFICANT PAST MEDICAL HISTORY   Allergy - year round Cancer [facility])     Comment:  PSA ELEVATED ESRD (end stage renal disease) ([place])     Comment:  [place] GERD (gastroesophageal reflux disease) Hearing deficit  History of kidney stones Hyperlipidemia Hypertension Hypothyroidism 2011: Myocardial infarction ([place]) No date: Stroke (cerebrum) ([place])     Comment:  weakness in left hand  04/2015, 05/20/16 No date: Thyroid disease     Comment:  hypothyroid  SIGNIFICANT EVENTS:  12/30 3 Vessel CABG - SVC to Diag1; SVC to OM1; SVC to PDA and LIMA to LAD. (N/A) Clipping Of Atrial Appendage using 45 MM AtriCure AtriClip (N/A)   STUDIES:    CULTURES:  12/28 Sars Coronavirus 2 : Negative 12/30 MRSA nasopharyngeal : Negative 1/13 Blood cultures: Pending results 1/13 Sputum cultures: Pending results  ANTIBIOTICS:  Cefepime 1/12  LINES/TUBES:  1/10 Chest tube 1/13 Thoracentesis   CONSULTANTS:  Nephrology CTS  SUBJECTIVE:  Intubated.   CONSTITUTIONAL: BP (!) 116/48   Pulse (!) 113  Temp (!) 97.4 F (36.3 C) (Oral)   Resp (!) 26   Ht _0  (1.727 m)   Wt 74.6 kg Comment: Informed Dr. Jonnie Finner  SpO2 100%   BMI 25.01 kg/m   I/O last 3 completed shifts: In: 1522.3 [I.V.:1022.3; IV [id number]:696] Out: 451 [Emesis/NG output:451]     Vent Mode: PRVC FiO2 (%):  [80 %-100 %] 80 % Set Rate:  [14 bmp-18 bmp] 18 bmp Vt Set:  [650 mL] 650 mL PEEP:  [12 cmH20] 12 cmH20 Plateau Pressure:  [26 cmH20] 26 cmH20  PHYSICAL EXAM: General:  NAD, appears ill  Neuro: intubated . EOMI .Responsive  HEENT:  PEERL, sclera antiicteric Cardiovascular:  RRR, no  mrg Lungs: CTAB, on vent Abdomen:  Normal bowel sounds Musculoskeletal:  No edema or deformity Skin:  Warm and dry, no rashes  RESOLVED PROBLEM LIST   ASSESSMENT AND PLAN   Acute respiratory failure after aspiration Currently on cefepime  P: Add vancomycin Follow cultures  Shock - shock likely from sepsis, patient febrile with leukocytosis.  Continue Levo, vasopressin  P: CVP, lactic acid LR Bolus Start stress dose steroids Add Neo  ESRD Per nephrology    SUMMARY OF TODAY'S PLAN:  Plan to further evaluate cause of sepsis with lab work. Broaden antibiotics.   Best Practice / Goals of Care / Disposition.   DVT PROPHYLAXIS: Subq Heparin  SUP: Protonix NUTRITION: tube feeds  MOBILITY: up with assistance GOALS OF CARE: wean vent as tolerated  FAMILY DISCUSSIONS: per primary  DISPOSITION: ICU  LABS  Glucose Recent Labs  Lab 03/09/19 1607 03/09/19 1957 03/09/19 2351 03/08/2019 0331 03/01/2019 0740 03/21/2019 0813  GLUCAP 147* 127* 134* 112* 58* 94    BMET Recent Labs  Lab 03/09/19 0426 03/09/19 1549 03/11/2019 0410 03/18/2019 0432 03/03/2019 0601  NA 137 137 138 133* 137  K 3.7 3.6 4.1 4.2 4.4  CL 102 100 99  --   --   CO2 25 25 21*  --   --   BUN 53* 68* 82*  --   --   CREATININE 3.60* 4.33* 5.29*  --   --   GLUCOSE 158* 160* 140*  --   --     Liver Enzymes Recent Labs  Lab 03/05/19 1300 03/09/19 0426 03/09/19 1549 03/19/2019 0410  AST 57*  --   --   --   ALT 47*  --   --   --   ALKPHOS 75  --   --   --   BILITOT 0.2*  --   --   --   ALBUMIN 2.0* 2.1* 2.0* 2.0*    Electrolytes Recent Labs  Lab 03/08/19 0444 03/09/19 0426 03/09/19 1549 03/06/2019 0410  CALCIUM 8.1* 8.1* 8.0* 8.0*  MG 2.3 2.3  --  2.4  PHOS 3.1 2.5 2.8 5.2*    CBC Recent Labs  Lab 03/08/19 0444 03/09/19 0427 03/27/2019 0410 03/07/2019 0432 03/14/2019 0601  WBC 9.3 11.4* 12.9*  --   --   HGB 8.5* 8.2* 8.8* 9.9* 9.9*  HCT 27.9* 29.1* 31.5* 29.0* 29.0*  PLT 110* 126* 151  --    --     ABG Recent Labs  Lab 03/28/2019 0041 03/16/2019 0432 03/26/2019 0601  PHART 7.364 7.159* 7.221*  PCO2ART 42.5 61.9* 54.7*  PO2ART 60.0* 57.0* 172.0*    Coag's No results for input(s): APTT, INR in the last 168 hours.  Sepsis Markers No results for input(s): LATICACIDVEN, PROCALCITON, O2SATVEN in the last 168 hours.  Cardiac  Enzymes No results for input(s): TROPONINI, PROBNP in the last 168 hours.  PAST MEDICAL HISTORY :   Robert [name]  has a past medical history of Allergy, Anemia, Cancer ([place]), Carotid artery occlusion, Cataract, Depression, Diabetes mellitus, Dialysis patient ([place]), Dyspnea, ESRD (end stage renal disease) ([place]), GERD (gastroesophageal reflux disease), Hearing deficit, History of kidney stones, Hyperlipidemia, Hypertension, Hypothyroidism, Myocardial infarction ([place]) (2011), Stroke (cerebrum) ([place]), and Thyroid disease.  PAST SURGICAL HISTORY:  Robert [name]  has a past surgical history that includes stents; Coronary angioplasty with stent (07/2009); TEE without cardioversion (N/A, 05/16/2015); Cardiac catheterization (N/A, 05/16/2015); Elbow surgery; Nose surgery; Esophagoscopy with dilitation; Endarterectomy (Right, 06/06/2016); AV fistula placement (Right, 07/25/2016); Colonoscopy; Fistula superficialization (Right, 12/05/2016); A/V Fistulagram (Right, 06/24/2017); PERIPHERAL VASCULAR BALLOON ANGIOPLASTY (06/24/2017); Bascilic vein transposition (Left, 10/15/2017); Bascilic vein transposition (Left, 12/10/2017); LEFT HEART CATH AND CORONARY ANGIOGRAPHY (N/A, 02/07/2019); Coronary artery bypass graft (N/A, 02/25/2019); Clipping of atrial appendage (N/A, 02/15/2019); and IR THORACENTESIS ASP PLEURAL SPACE W/IMG GUIDE (03/09/2019).  Allergies  Allergen Reactions  . Lisinopril Other (See Comments)    "made me lose my voice"    No current facility-administered medications on file prior to encounter.   Current Outpatient Medications on File Prior to Encounter  Medication Sig  . aspirin 81  MG tablet Take 1 tablet (81 mg total) by mouth daily.  . calcium acetate (PHOSLO) 667 MG tablet Take 1,334 mg by mouth See admin instructions. Take 2 tablets (1334 mg) by mouth daily with any meals or snacks.  . clopidogrel (PLAVIX) 75 MG tablet Take 1 tablet (75 mg total) by mouth daily.  [name] ezetimibe (ZETIA) 10 MG tablet Take 1 tablet (10 mg total) by mouth daily.  . fexofenadine (ALLEGRA) 180 MG tablet Take 180 mg by mouth daily.  . folic acid-vitamin b complex-vitamin c-selenium-zinc (DIALYVITE) 3 MG TABS tablet Take 1 tablet by mouth daily.  . hydrALAZINE (APRESOLINE) 50 MG tablet Take 50 mg by mouth See admin instructions. Sunday, Monday, Wednesday & Friday patient takes 1 tablet (50 mg) by mouth twice daily. Tuesday, Thursday, & Saturdays take 1 tablet at bedtime  . HYDROcodone-acetaminophen (NORCO/VICODIN) 5-325 MG tablet Take 1 tablet by mouth every 4 (four) hours as needed. (Patient taking differently: Take 1 tablet by mouth every 4 (four) hours as needed for moderate pain. )  . insulin degludec (TRESIBA FLEXTOUCH) 100 UNIT/ML SOPN FlexTouch Pen Inject 15 Units into the skin at bedtime.   [name] levothyroxine (SYNTHROID, LEVOTHROID) 50 MCG tablet Take 50 mcg by mouth daily before breakfast.   . lidocaine (LIDODERM) 5 % Place 1 patch onto the skin daily. Remove & Discard patch within 12 hours or as directed by MD  . metoprolol succinate (TOPROL-XL) 50 MG 24 hr tablet TAKE 1 TABLET BY MOUTH IN  THE EVENING TUESDAY,  THURSDAY, SATURDAY AND 1  TABLET TWICE DAILY ALL  OTHER DAYS (Patient taking differently: Take 50 mg by mouth See admin instructions. TAKE 1 TABLET (50 MG TOTALLY)BY MOUTH IN  THE EVENING TUESDAY,  THURSDAY, SATURDAY AND 1  TABLET (50 MG TOTALLY) TWICE DAILY ALL  OTHER DAYS)  . Multiple Vitamins-Minerals (PRESERVISION AREDS PO) Take 1 tablet by mouth daily.  . pantoprazole (PROTONIX) 40 MG tablet Take 40 mg by mouth every evening.   . sertraline (ZOLOFT) 25 MG tablet Take 25 mg by mouth  daily.   . simvastatin (ZOCOR) 40 MG tablet Take 1 tablet (40 mg total) by mouth daily at 6 PM.  . ACCU-CHEK AVIVA PLUS test strip  FAMILY HISTORY:   His family history includes Dementia in his maternal grandfather; Diabetes in his mother. There is no history of Heart attack.  SOCIAL HISTORY:  Robert [name]  reports that Robert [name] has quit smoking. His smoking use included cigarettes. Robert [name] has never used smokeless tobacco. Robert [name] reports current alcohol use of about 2.0 standard drinks of alcohol per week. Robert [name] reports that Robert [name] does not use drugs.  REVIEW OF SYSTEMS:    Unable to obtain while patient intubated

## 2019-03-10 NOTE — Progress Notes (Signed)
PT Cancellation Note  Patient Details Name: Robert [name]. MRN: [id number] DOB: Aug 30, 1935   Cancelled Treatment:    Reason Eval/Treat Not Completed: Medical issues which prohibited therapy. Pt re-intubated this morning, currently not appropriate for PT intervention. PT will continue to follow for medical appropriateness.   Zenaida Niece 03/23/2019, 9:56 AM

## 2019-03-10 NOTE — Anesthesia Procedure Notes (Signed)
Procedure Name: Intubation Date/Time: 03/09/2019 5:12 AM Performed by: Valetta Fuller, CRNA Pre-anesthesia Checklist: Patient identified, Emergency Drugs available, Suction available and Patient being monitored Patient Re-evaluated:Patient Re-evaluated prior to induction Oxygen Delivery Method: Circle system utilized Preoxygenation: Pre-oxygenation with 100% oxygen Induction Type: IV induction Laryngoscope Size: Miller and 2 Grade View: Grade I Tube size: 8.0 mm Number of attempts: 1 Airway Equipment and Method: Stylet Placement Confirmation: ETT inserted through vocal cords under direct vision,  CO2 detector and breath sounds checked- equal and bilateral Secured at: 22 cm Tube secured with: Tape Dental Injury: Teeth and Oropharynx as per pre-operative assessment

## 2019-03-10 NOTE — Progress Notes (Signed)
CRITICAL VALUE ALERT  Critical Value:  Lactic acid 7.5  Date & Time Notied:  03/22/2019 1055  Provider Notified: Court Joy MD  Orders Received/Actions taken: he will discuss with Mannam MD - awaiting orders  Also notified Steen MD of continued low BPs on 50 of levophed and 0.04 of vasopressin.

## 2019-03-10 NOTE — Consult Note (Signed)
Anesthesiology Note:  84 year old male  14 days S/P CABG now with hypercarbic resp failure unable to tolerate BiPAP. Pre-O2 with bag mask ventilation. Amidate 10 mg and succinyl choline 120 mg given. DL X 1 by Glynis Smiles, CRNA, chords seen easily, 8.0 ETT inserted without difficulty. BBSE, (+) CO2 detected, taped at 21 cm at lip.CXR pending. O2 Sat 96%, BP 86/50 HR- 119 following intubation   Roberts Gaudy

## 2019-03-10 NOTE — Progress Notes (Signed)
Hypoglycemic Event  CBG: 58  Treatment: 25 mL D50  Symptoms: none  Follow-up CBG: Time: 0812 CBG Result: 94  Possible Reasons for Event: tube feeds held, patient NPO  Comments/MD notified: Kipp Brood MD   Waneta Martins

## 2019-03-10 NOTE — Progress Notes (Signed)
Hypoglycemic Event  CBG: critically low - less than 10  Treatment: 50 mL D50  Symptoms: disoriented  Follow-up CBG: Time:1215 CBG Result:109  Possible Reasons for Event: NPO  Comments/MD notified: will be discussing with [place]

## 2019-03-10 NOTE — Progress Notes (Signed)
At start of this RN's shift, patient's BP was 60s/30s. Levophed at 45, vasopressin at 0.04. Two bottles of albumin being given with BP response. [place] rounded shortly after. Orders for two more bottles of albumin received. Also ordered to remove chest tube. CCM consulted. Labs sent. Orders for 500 cc LR received. Nephrology rounded. Aware of BP, pressor requirements, and fluids given. Will be writing orders for CRRT later this afternoon.

## 2019-03-10 NOTE — Progress Notes (Signed)
PCCM progress note  Patient examined on return to ICU from exploratory laparotomy in OR Reviewed op note with findings of an infarcted colon status post resection.  Blood pressure 108/76, pulse 96, temperature 98.1 F (36.7 C), temperature source Axillary, resp. rate (!) 33, height 5\' 8"  (1.727 m), weight 74.6 kg, SpO2 91 %. Gen:      No acute distress HEENT:  EOMI, sclera anicteric Neck:     No masses; no thyromegaly, ET tube Lungs:    Clear to auscultation bilaterally; normal respiratory effort CV:         Regular rate and rhythm; no murmurs Abd:      Abdominal wound VAC, dressing Ext:    No edema; adequate peripheral perfusion Skin:      Warm and dry; no rash Neuro: Sedated, unresponsive  Assessment/plan 84 year old with a fib, coronary artery disease, CABG, shock secondary to acute abdomen, ischemic colon status post resection  ABG 09/09/43/210/99% Currently on norepinephrine, vasopresin, Neo-Synephrine, epinephrine Continue bicarb drip at 250 cc/h Replete Ca, follow lactic a Transfuse PRBC for low hb We will contact nephrology to initiate CVVH Continue broad antibiotic coverage for now  The patient is critically ill with multiple organ system failure and requires high complexity decision making for assessment and support, frequent evaluation and titration of therapies, advanced monitoring, review of radiographic studies and interpretation of complex data.   Critical Care Time devoted to patient care services, exclusive of separately billable procedures, described in this note is 35 minutes.   Marshell Garfinkel MD [facility] Please see [email or web address] for pager details.  03/23/2019, 4:23 PM

## 2019-03-10 NOTE — Progress Notes (Signed)
      [address]       [place] [address]             [phone]      CTSP for worsening status  [name] has had a significant deterioration through the day today. He is now requiring multiple high dose pressors. After being on 11 mcg/min of levophed this morning he is now on 22. Also on 0.04 of vasopressin and 400 mcg/min of neo, despite being resuscitated with 4 bottles of albumin in addition to normal saline. CVP up to 24 currently. Bedside echo by Dr. Vaughan Browner shows no major pericardial effusion. LV is hypokinetic and RV appears relatively dilated. Lactic acid is 7.5 and he has had profound hypoglycemia. Cortisol is 33.  CXR this AM showed IMPRESSION: Left-sided pigtail catheter is noted without pneumothorax. Mild bibasilar subsegmental atelectasis is noted. Small left pleural effusion is noted. Endotracheal and nasogastric tubes are in grossly good position.   Electronically Signed   By: Marijo Conception M.D.   On: 03/21/2019 07:55 KUB showed a nonobstructive bowel gas pattern.  He has abdominal tenderness on exam.  This is most likely an intraabdominal catastrophe. I think the deterioration is out of proportion to CXR findings for this to all be aspiration. He is currently too unstable ot go to CT. Hopefully we can resuscitate him to the point where he will be able to go.  D/w Dr. Vaughan Browner. We will ask General surgery to consult.  Robert Standard Roxan Hockey, MD [facility] [phone]

## 2019-03-10 NOTE — Consult Note (Signed)
[facility] Surgery Consult Note  Robert [name]. May 01, 1935  [id number].    Requesting MD: Modesto Charon Chief Complaint/Reason for Consult: lactic acidosis  HPI:  Robert [name]. is an 84yo male PMH ESRD on TTS dialysis, HTN, DM2, stroke s/p CEA, CAD s/p 3 stents, who was admitted to [facility] 12/28 and found to be in atrial fibrillation with RVR with elevated troponin. He underwent CABG and clipping of atrial appendage on 12/30 and has been in the hospital since. Today he was noted to be acutely worse with new oxygen and increased pressor requirement after suspected aspiration event. He was emergently intubated earlier this morning. Currently in septic shock with elevated lactic acid 7.5, tachycardic, and hypotensive on levophed, neo, and vasopressin. Patient not stable enough to be taken to the CT scanner. General surgery asked to evaluate for suspected intraabdominal source of his decline.   Review of Systems  Unable to perform ROS: Intubated     Family History  Problem Relation Age of Onset  . Diabetes Mother   . Dementia Maternal Grandfather   . Heart attack Neg Hx     Past Medical History:  Diagnosis Date  . Allergy    year round  . Anemia   . Cancer (HCC)    PSA ELEVATED  . Carotid artery occlusion   . Cataract   . Depression   . Diabetes mellitus   . Dialysis patient ([place])   . Dyspnea   . ESRD (end stage renal disease) [facility])    [place]  . GERD (gastroesophageal reflux disease)   . Hearing deficit   . History of kidney stones   . Hyperlipidemia   . Hypertension   . Hypothyroidism   . Myocardial infarction ([place]) 2011  . Stroke (cerebrum) (HCC)    weakness in left hand  04/2015, 05/20/16  . Thyroid disease    hypothyroid    Past Surgical History:  Procedure Laterality Date  . A/V FISTULAGRAM Right 06/24/2017   Procedure: A/V FISTULAGRAM;  Surgeon: Serafina Mitchell, MD;  Location: [place] CV LAB;  Service: Cardiovascular;   Laterality: Right;  rt  lower arm  . AV FISTULA PLACEMENT Right 07/25/2016   Procedure: ARTERIOVENOUS (AV) FISTULA CREATION-RIGHT;  Surgeon: Serafina Mitchell, MD;  Location: [place];  Service: Vascular;  Laterality: Right;  . BASCILIC VEIN TRANSPOSITION Left 10/15/2017   Procedure: FIRST STAGE BASILIC VEIN TRANSPOSITION LEFT UPPER EXTREMITY;  Surgeon: Serafina Mitchell, MD;  Location: [place];  Service: Vascular;  Laterality: Left;  . BASCILIC VEIN TRANSPOSITION Left 12/10/2017   Procedure: SECOND STAGE BASILIC VEIN TRANSPOSITION LEFT ARM;  Surgeon: Serafina Mitchell, MD;  Location: [place];  Service: Vascular;  Laterality: Left;  . CLIPPING OF ATRIAL APPENDAGE N/A 01/27/2019   Procedure: Clipping Of Atrial Appendage using 51 MM AtriCure AtriClip;  Surgeon: [name], MD;  Location: [place];  Service: Open Heart Surgery;  Laterality: N/A;  . COLONOSCOPY    . CORONARY ANGIOPLASTY WITH STENT PLACEMENT  07/2009   3 stents  . CORONARY ARTERY BYPASS GRAFT N/A 01/30/2019   Procedure: CORONARY ARTERY BYPASS GRAFTING (CABG) using endoscopic greater saphenous vein harvest: SVC to Diag1; SVC to OM1; SVC to PDA and LIMA to LAD.;  Surgeon: [name], MD;  Location: [place];  Service: Open Heart Surgery;  Laterality: N/A;  . ELBOW SURGERY     LEFT  . ENDARTERECTOMY Right 06/06/2016   Procedure: RIGHT CAROTID ENDARTERECTOMY WITH PATCH ANGIOPLASTY;  Surgeon: Serafina Mitchell, MD;  Location: [place];  Service: Vascular;  Laterality: Right;  . EP IMPLANTABLE DEVICE N/A 05/16/2015   Procedure: Loop Recorder Insertion;  Surgeon: Thompson Grayer, MD;  Location: [place] CV LAB;  Service: Cardiovascular;  Laterality: N/A;  . ESOPHAGOSCOPY WITH DILITATION    . FISTULA SUPERFICIALIZATION Right 12/05/2016   Procedure: Superficialization and Ligation of Branches OF RIGHT ARM RADIOCEPHALIC FISTULA;  Surgeon: Serafina Mitchell, MD;  [facility];  Service: Vascular;  Laterality: Right;  . IR THORACENTESIS ASP  PLEURAL SPACE W/IMG GUIDE  03/09/2019  . LEFT HEART CATH AND CORONARY ANGIOGRAPHY N/A 02/12/2019   Procedure: LEFT HEART CATH AND CORONARY ANGIOGRAPHY;  Surgeon: Leonie Man, MD;  Location: [place] CV LAB;  Service: Cardiovascular;  Laterality: N/A;  . NOSE SURGERY    . PERIPHERAL VASCULAR BALLOON ANGIOPLASTY  06/24/2017   Procedure: PERIPHERAL VASCULAR BALLOON ANGIOPLASTY;  Surgeon: Serafina Mitchell, MD;  Location: [place] CV LAB;  Service: Cardiovascular;;  rt arm fistula   . stents    . TEE WITHOUT CARDIOVERSION N/A 05/16/2015   Procedure: TRANSESOPHAGEAL ECHOCARDIOGRAM (TEE);  Surgeon: Thayer Headings, MD;  Location: [place];  Service: Cardiovascular;  Laterality: N/A;    Social History:  reports that he has quit smoking. His smoking use included cigarettes. He has never used smokeless tobacco. He reports current alcohol use of about 2.0 standard drinks of alcohol per week. He reports that he does not use drugs.  Allergies:  Allergies  Allergen Reactions  . Lisinopril Other (See Comments)    "made me lose my voice"    Medications Prior to Admission  Medication Sig Dispense Refill  . aspirin 81 MG tablet Take 1 tablet (81 mg total) by mouth daily. 30 tablet 0  . calcium acetate (PHOSLO) 667 MG tablet Take 1,334 mg by mouth See admin instructions. Take 2 tablets (1334 mg) by mouth daily with any meals or snacks.  5  . clopidogrel (PLAVIX) 75 MG tablet Take 1 tablet (75 mg total) by mouth daily. 30 tablet 1  . ezetimibe (ZETIA) 10 MG tablet Take 1 tablet (10 mg total) by mouth daily. 30 tablet 1  . fexofenadine (ALLEGRA) 180 MG tablet Take 180 mg by mouth daily.    . folic acid-vitamin b complex-vitamin c-selenium-zinc (DIALYVITE) 3 MG TABS tablet Take 1 tablet by mouth daily.    . hydrALAZINE (APRESOLINE) 50 MG tablet Take 50 mg by mouth See admin instructions. Sunday, Monday, Wednesday & Friday patient takes 1 tablet (50 mg) by mouth twice daily. Tuesday, Thursday, &  Saturdays take 1 tablet at bedtime    . HYDROcodone-acetaminophen (NORCO/VICODIN) 5-325 MG tablet Take 1 tablet by mouth every 4 (four) hours as needed. (Patient taking differently: Take 1 tablet by mouth every 4 (four) hours as needed for moderate pain. ) 6 tablet 0  . insulin degludec (TRESIBA FLEXTOUCH) 100 UNIT/ML SOPN FlexTouch Pen Inject 15 Units into the skin at bedtime.     [name] levothyroxine (SYNTHROID, LEVOTHROID) 50 MCG tablet Take 50 mcg by mouth daily before breakfast.     . lidocaine (LIDODERM) 5 % Place 1 patch onto the skin daily. Remove & Discard patch within 12 hours or as directed by MD 5 patch 0  . metoprolol succinate (TOPROL-XL) 50 MG 24 hr tablet TAKE 1 TABLET BY MOUTH IN  THE EVENING TUESDAY,  THURSDAY, SATURDAY AND 1  TABLET TWICE DAILY ALL  OTHER DAYS (Patient taking differently: Take 50 mg  by mouth See admin instructions. TAKE 1 TABLET (50 MG TOTALLY)BY MOUTH IN  THE EVENING TUESDAY,  THURSDAY, SATURDAY AND 1  TABLET (50 MG TOTALLY) TWICE DAILY ALL  OTHER DAYS) 90 tablet 5  . Multiple Vitamins-Minerals (PRESERVISION AREDS PO) Take 1 tablet by mouth daily.    . pantoprazole (PROTONIX) 40 MG tablet Take 40 mg by mouth every evening.     . sertraline (ZOLOFT) 25 MG tablet Take 25 mg by mouth daily.     . simvastatin (ZOCOR) 40 MG tablet Take 1 tablet (40 mg total) by mouth daily at 6 PM. 30 tablet 0  . ACCU-CHEK AVIVA PLUS test strip       Prior to Admission medications   Medication Sig Start Date End Date Taking? Authorizing Provider  aspirin 81 MG tablet Take 1 tablet (81 mg total) by mouth daily. 12/26/17  Yes Richardo Priest, MD  calcium acetate (PHOSLO) 667 MG tablet Take 1,334 mg by mouth See admin instructions. Take 2 tablets (1334 mg) by mouth daily with any meals or snacks. 10/14/17  Yes [provider]  clopidogrel (PLAVIX) 75 MG tablet Take 1 tablet (75 mg total) by mouth daily. 05/24/16  Yes Reyne Dumas, MD  ezetimibe (ZETIA) 10 MG tablet Take 1 tablet (10  mg total) by mouth daily. 05/25/16  Yes Reyne Dumas, MD  fexofenadine (ALLEGRA) 180 MG tablet Take 180 mg by mouth daily.   Yes [provider]  folic acid-vitamin b complex-vitamin c-selenium-zinc (DIALYVITE) 3 MG TABS tablet Take 1 tablet by mouth daily.   Yes [provider]  hydrALAZINE (APRESOLINE) 50 MG tablet Take 50 mg by mouth See admin instructions. Sunday, Monday, Wednesday & Friday patient takes 1 tablet (50 mg) by mouth twice daily. Tuesday, Thursday, & Saturdays take 1 tablet at bedtime 06/13/15  Yes [provider]  HYDROcodone-acetaminophen (NORCO/VICODIN) 5-325 MG tablet Take 1 tablet by mouth every 4 (four) hours as needed. Patient taking differently: Take 1 tablet by mouth every 4 (four) hours as needed for moderate pain.  01/10/19  Yes Maudie Flakes, MD  insulin degludec (TRESIBA FLEXTOUCH) 100 UNIT/ML SOPN FlexTouch Pen Inject 15 Units into the skin at bedtime.    Yes [provider]  levothyroxine (SYNTHROID, LEVOTHROID) 50 MCG tablet Take 50 mcg by mouth daily before breakfast.  03/03/15  Yes [provider]  lidocaine (LIDODERM) 5 % Place 1 patch onto the skin daily. Remove & Discard patch within 12 hours or as directed by MD 01/10/19  Yes Bero, Barth Kirks, MD  metoprolol succinate (TOPROL-XL) 50 MG 24 hr tablet TAKE 1 TABLET BY MOUTH IN  THE EVENING TUESDAY,  THURSDAY, SATURDAY AND 1  TABLET TWICE DAILY ALL  OTHER DAYS Patient taking differently: Take 50 mg by mouth See admin instructions. TAKE 1 TABLET (50 MG TOTALLY)BY MOUTH IN  THE EVENING TUESDAY,  THURSDAY, SATURDAY AND 1  TABLET (50 MG TOTALLY) TWICE DAILY ALL  OTHER DAYS 01/18/19  Yes Richardo Priest, MD  Multiple Vitamins-Minerals (PRESERVISION AREDS PO) Take 1 tablet by mouth daily.   Yes [provider]  pantoprazole (PROTONIX) 40 MG tablet Take 40 mg by mouth every evening.    Yes [provider]  sertraline (ZOLOFT) 25 MG tablet Take 25 mg by mouth daily.   07/27/18  Yes [provider]  simvastatin (ZOCOR) 40 MG tablet Take 1 tablet (40 mg total) by mouth daily at 6 PM. 05/17/15  Yes Velvet Bathe, MD  ACCU-CHEK AVIVA  PLUS test strip  12/09/17   [provider]  omega-3 acid ethyl esters (LOVAZA) 1 g capsule TAKE 2 CAPSULES BY MOUTH 2  TIMES DAILY 02/25/19   Richardo Priest, MD    Blood pressure 92/72, pulse (!) 57, temperature 98.1 F (36.7 C), temperature source Axillary, resp. rate (!) 28, height '5\' 8"'$  (1.727 m), weight 74.6 kg, SpO2 (!) 80 %. Physical Exam: General: chronically ill appearing white male who is laying in bed, on the ventilator HEENT: head is normocephalic, atraumatic.  Sclera are noninjected.  Pupils equal and round.  Ears and nose without any masses or lesions.  Mouth is dry, ETT in place. Dentition fair Heart: tachycardic. Feet warm bilaterally but difficult to palpate pulses Lungs: diffuse rhonchi, no wheezes. Mechanically ventilated Abd: no previous incisions noted, somewhat firm, mildly distended, hypoactive BS, diffusely tender with guarding. no masses, hernias, or organomegaly MS: calves soft and nontender without edema Skin: warm and dry Psych: unable to assess Neuro: unable to assess  Results for orders placed or performed during the hospital encounter of 02/11/2019 (from the past 48 hour(s))  Renal function panel (daily at 1600)     Status: Abnormal   Collection Time: 03/08/19  4:00 PM  Result Value Ref Range   Sodium 138 135 - 145 mmol/L   Potassium 3.8 3.5 - 5.1 mmol/L   Chloride 101 98 - 111 mmol/L   CO2 26 22 - 32 mmol/L   Glucose, Bld 108 (H) 70 - 99 mg/dL   BUN 36 (H) 8 - 23 mg/dL   Creatinine, Ser 2.69 (H) 0.61 - 1.24 mg/dL   Calcium 8.1 (L) 8.9 - 10.3 mg/dL   Phosphorus 2.4 (L) 2.5 - 4.6 mg/dL   Albumin 2.2 (L) 3.5 - 5.0 g/dL   GFR calc non Af Amer 21 (L) >60 mL/min   GFR calc Af Amer 25 (L) >60 mL/min   Anion gap 11 5 - 15    Comment: Performed at [facility], [address] [place], [place] [address]  Glucose, capillary     Status: Abnormal   Collection Time: 03/08/19  4:06 PM  Result Value Ref Range   Glucose-Capillary 100 (H) 70 - 99 mg/dL  Glucose, capillary     Status: Abnormal   Collection Time: 03/08/19  7:54 PM  Result Value Ref Range   Glucose-Capillary 147 (H) 70 - 99 mg/dL  Glucose, capillary     Status: Abnormal   Collection Time: 03/08/19 11:50 PM  Result Value Ref Range   Glucose-Capillary 163 (H) 70 - 99 mg/dL  Glucose, capillary     Status: Abnormal   Collection Time: 03/09/19  3:55 AM  Result Value Ref Range   Glucose-Capillary 148 (H) 70 - 99 mg/dL  Renal function panel (daily at 0500)     Status: Abnormal   Collection Time: 03/09/19  4:26 AM  Result Value Ref Range   Sodium 137 135 - 145 mmol/L   Potassium 3.7 3.5 - 5.1 mmol/L   Chloride 102 98 - 111 mmol/L   CO2 25 22 - 32 mmol/L   Glucose, Bld 158 (H) 70 - 99 mg/dL   BUN 53 (H) 8 - 23 mg/dL   Creatinine, Ser 3.60 (H) 0.61 - 1.24 mg/dL   Calcium 8.1 (L) 8.9 - 10.3 mg/dL   Phosphorus 2.5 2.5 - 4.6 mg/dL   Albumin 2.1 (L) 3.5 - 5.0 g/dL   GFR calc non Af Amer 15 (L) >60 mL/min  GFR calc Af Amer 17 (L) >60 mL/min   Anion gap 10 5 - 15    Comment: Performed at [facility] [address] [place], [place] [address]  Magnesium     Status: None   Collection Time: 03/09/19  4:26 AM  Result Value Ref Range   Magnesium 2.3 1.7 - 2.4 mg/dL    Comment: Performed at [facility] [address], [place], [place] [address]  CBC     Status: Abnormal   Collection Time: 03/09/19  4:27 AM  Result Value Ref Range   WBC 11.4 (H) 4.0 - 10.5 K/uL   RBC 2.79 (L) 4.22 - 5.81 MIL/uL   Hemoglobin 8.2 (L) 13.0 - 17.0 g/dL   HCT 29.1 (L) 39.0 - 52.0 %   MCV 104.3 (H) 80.0 - 100.0 fL   MCH 29.4 26.0 - 34.0 pg   MCHC 28.2 (L) 30.0 - 36.0 g/dL   RDW 18.8 (H) 11.5 - 15.5 %   Platelets 126 (L) 150 - 400 K/uL   nRBC 1.9 (H) 0.0 - 0.2 %    Comment: Performed at [facility] [address], [place], [place] [address]  Glucose, capillary     Status: Abnormal   Collection Time: 03/09/19  8:10 AM  Result Value Ref Range   Glucose-Capillary 161 (H) 70 - 99 mg/dL  Glucose, capillary     Status: Abnormal   Collection Time: 03/09/19 12:09 PM  Result Value Ref Range   Glucose-Capillary 159 (H) 70 - 99 mg/dL  Renal function panel (daily at 1600)     Status: Abnormal   Collection Time: 03/09/19  3:49 PM  Result Value Ref Range   Sodium 137 135 - 145 mmol/L   Potassium 3.6 3.5 - 5.1 mmol/L   Chloride 100 98 - 111 mmol/L   CO2 25 22 - 32 mmol/L   Glucose, Bld 160 (H) 70 - 99 mg/dL   BUN 68 (H) 8 - 23 mg/dL   Creatinine, Ser 4.33 (H) 0.61 - 1.24 mg/dL   [place] 8.0 (L) 8.9 - 10.3 mg/dL   Phosphorus 2.8 2.5 - 4.6 mg/dL   Albumin 2.0 (L) 3.5 - 5.0 g/dL   GFR calc non Af Amer 12 (L) >60 mL/min   GFR calc Af Amer 14 (L) >60 mL/min   Anion gap 12 5 - 15    Comment: Performed at [facility], [place] [address], [place], [place] [address]  Glucose, capillary     Status: Abnormal   Collection Time: 03/09/19  3:55 PM  Result Value Ref Range   Glucose-Capillary 151 (H) 70 - 99 mg/dL  Glucose, capillary     Status: Abnormal   Collection Time: 03/09/19  4:07 PM  Result Value Ref Range   Glucose-Capillary 147 (H) 70 - 99 mg/dL  Glucose, capillary     Status: Abnormal   Collection Time: 03/09/19  7:57 PM  Result Value Ref Range   Glucose-Capillary 127 (H) 70 - 99 mg/dL  Glucose, capillary     Status: Abnormal   Collection Time: 03/09/19 11:51 PM  Result Value Ref Range   Glucose-Capillary 134 (H) 70 - 99 mg/dL  I-STAT 7, (LYTES, BLD GAS, ICA, H+H)     Status: Abnormal   Collection Time: 03/18/2019 12:41 AM  Result Value Ref Range   pH, Arterial 7.364 7.350 - 7.450   pCO2 arterial 42.5 32.0 - 48.0 mmHg   pO2, Arterial 60.0 (L) 83.0 -  108.0 mmHg   Bicarbonate 23.9 20.0 - 28.0 mmol/L   TCO2 25 22 - 32 mmol/L   O2 Saturation 88.0 %   Acid-base deficit 1.0 0.0 - 2.0  mmol/L   Sodium 135 135 - 145 mmol/L   Potassium 3.7 3.5 - 5.1 mmol/L   [place], Ion 1.09 (L) 1.15 - 1.40 mmol/L   HCT 27.0 (L) 39.0 - 52.0 %   Hemoglobin 9.2 (L) 13.0 - 17.0 g/dL   Patient temperature 101.0 F    Collection site BRACHIAL ARTERY    Drawn by RT    Sample type ARTERIAL   Glucose, capillary     Status: Abnormal   Collection Time: 03/25/2019  3:31 AM  Result Value Ref Range   Glucose-Capillary 112 (H) 70 - 99 mg/dL  Renal function panel (daily at 0500)     Status: Abnormal   Collection Time: 03/09/2019  4:10 AM  Result Value Ref Range   Sodium 138 135 - 145 mmol/L   Potassium 4.1 3.5 - 5.1 mmol/L   Chloride 99 98 - 111 mmol/L   CO2 21 (L) 22 - 32 mmol/L   Glucose, Bld 140 (H) 70 - 99 mg/dL   BUN 82 (H) 8 - 23 mg/dL   Creatinine, Ser 5.29 (H) 0.61 - 1.24 mg/dL   Calcium 8.0 (L) 8.9 - 10.3 mg/dL   Phosphorus 5.2 (H) 2.5 - 4.6 mg/dL   Albumin 2.0 (L) 3.5 - 5.0 g/dL   GFR calc non Af Amer 9 (L) >60 mL/min   GFR calc Af Amer 11 (L) >60 mL/min   Anion gap 18 (H) 5 - 15    Comment: Performed at [facility], [address], [place], [place] [address]  Magnesium     Status: None   Collection Time: 03/14/2019  4:10 AM  Result Value Ref Range   Magnesium 2.4 1.7 - 2.4 mg/dL    Comment: Performed at [facility] [address], [place], [place] [address]  CBC     Status: Abnormal   Collection Time: 03/15/2019  4:10 AM  Result Value Ref Range   WBC 12.9 (H) 4.0 - 10.5 K/uL    Comment: REPEATED TO VERIFY WHITE COUNT CONFIRMED ON SMEAR    RBC 2.97 (L) 4.22 - 5.81 MIL/uL   Hemoglobin 8.8 (L) 13.0 - 17.0 g/dL   HCT 31.5 (L) 39.0 - 52.0 %   MCV 106.1 (H) 80.0 - 100.0 fL   MCH 29.6 26.0 - 34.0 pg   MCHC 27.9 (L) 30.0 - 36.0 g/dL   RDW 18.4 (H) 11.5 - 15.5 %   Platelets 151 150 - 400 K/uL   nRBC 5.8 (H) 0.0 - 0.2 %    Comment: Performed at [facility], [place] [address] [place], [place] [address]  I-STAT 7, (LYTES, BLD GAS, ICA, H+H)     Status: Abnormal    Collection Time: 03/22/2019  4:32 AM  Result Value Ref Range   pH, Arterial 7.159 (LL) 7.350 - 7.450   pCO2 arterial 61.9 (H) 32.0 - 48.0 mmHg   pO2, Arterial 57.0 (L) 83.0 - 108.0 mmHg   Bicarbonate 21.9 20.0 - 28.0 mmol/L   TCO2 24 22 - 32 mmol/L   O2 Saturation 78.0 %   Acid-base deficit 7.0 (H) 0.0 - 2.0 mmol/L   Sodium 133 (L) 135 - 145 mmol/L   Potassium 4.2 3.5 - 5.1 mmol/L   Calcium, Ion 1.12 (L) 1.15 - 1.40 mmol/L  HCT 29.0 (L) 39.0 - 52.0 %   Hemoglobin 9.9 (L) 13.0 - 17.0 g/dL   Patient temperature 99.6 F    Sample type ARTERIAL    Comment NOTIFIED PHYSICIAN   I-STAT 7, (LYTES, BLD GAS, ICA, H+H)     Status: Abnormal   Collection Time: 02/27/2019  6:01 AM  Result Value Ref Range   pH, Arterial 7.221 (L) 7.350 - 7.450   pCO2 arterial 54.7 (H) 32.0 - 48.0 mmHg   pO2, Arterial 172.0 (H) 83.0 - 108.0 mmHg   Bicarbonate 22.5 20.0 - 28.0 mmol/L   TCO2 24 22 - 32 mmol/L   O2 Saturation 99.0 %   Acid-base deficit 5.0 (H) 0.0 - 2.0 mmol/L   Sodium 137 135 - 145 mmol/L   Potassium 4.4 3.5 - 5.1 mmol/L   [place]cium, Ion 1.05 (L) 1.15 - 1.40 mmol/L   HCT 29.0 (L) 39.0 - 52.0 %   Hemoglobin 9.9 (L) 13.0 - 17.0 g/dL   Patient temperature HIDE    Sample type ARTERIAL   .Cooxemetry Panel (carboxy, met, total hgb, O2 sat)     Status: Abnormal   Collection Time: 03/16/2019  6:31 AM  Result Value Ref Range   Total hemoglobin 8.4 (L) 12.0 - 16.0 g/dL   O2 Saturation 69.0 %   Carboxyhemoglobin 1.3 0.5 - 1.5 %   Methemoglobin 1.5 0.0 - 1.5 %    Comment: Performed at [facility] [address] [place], [place] [address]  Glucose, capillary     Status: Abnormal   Collection Time: 03/17/2019  7:40 AM  Result Value Ref Range   Glucose-Capillary 58 (L) 70 - 99 mg/dL  Glucose, capillary     Status: None   Collection Time: 03/08/2019  8:13 AM  Result Value Ref Range   Glucose-Capillary 94 70 - 99 mg/dL  .Cooxemetry Panel (carboxy, met, total hgb, O2 sat)     Status: Abnormal    Collection Time: 03/02/2019  9:32 AM  Result Value Ref Range   Total hemoglobin 7.8 (L) 12.0 - 16.0 g/dL   O2 Saturation 73.9 %   Carboxyhemoglobin 1.3 0.5 - 1.5 %   Methemoglobin 1.8 (H) 0.0 - 1.5 %    Comment: Performed at [facility] [address] [place], [place] [address]  Lactic acid, plasma     Status: Abnormal   Collection Time: 03/01/2019  9:32 AM  Result Value Ref Range   Lactic Acid, Venous 7.5 (HH) 0.5 - 1.9 mmol/L    Comment: CRITICAL RESULT CALLED TO, READ BACK BY AND VERIFIED WITH: KARA BRADY,RN AT 1052 03/06/2019 BY ZBEECH. Performed at [facility], [place] [address], [place], [place] [address]   Cortisol     Status: None   Collection Time: 03/09/2019  9:40 AM  Result Value Ref Range   Cortisol, Plasma 33.3 ug/dL    Comment: (NOTE) AM    6.7 - 22.6 ug/dL PM   <10.0       ug/dL Performed at [facility] [address], [place], [place] [address]   Glucose, capillary     Status: Abnormal   Collection Time: 03/23/2019 11:56 AM  Result Value Ref Range   Glucose-Capillary <10 (LL) 70 - 99 mg/dL  Glucose, capillary     Status: Abnormal   Collection Time: 03/24/2019 11:58 AM  Result Value Ref Range   Glucose-Capillary <10 (LL) 70 - 99 mg/dL  Glucose, capillary     Status: Abnormal  Collection Time: 03/05/2019 12:14 PM  Result Value Ref Range   Glucose-Capillary 109 (H) 70 - 99 mg/dL  Glucose, capillary     Status: Abnormal   Collection Time: 03/09/2019 12:58 PM  Result Value Ref Range   Glucose-Capillary 37 (LL) 70 - 99 mg/dL  Glucose, capillary     Status: Abnormal   Collection Time: 03/08/2019  1:21 PM  Result Value Ref Range   Glucose-Capillary 117 (H) 70 - 99 mg/dL   DG Chest Port 1 View  Result Date: 03/01/2019 CLINICAL DATA:  Status. EXAM: PORTABLE CHEST 1 VIEW COMPARISON:  Same day. FINDINGS: Stable cardiomediastinal silhouette. Endotracheal and nasogastric tubes appear to be in grossly good position. Right subclavian catheter is unchanged.  Left-sided pigtail catheter is noted without pneumothorax. Mild bibasilar subsegmental atelectasis is noted. Small left pleural effusion is noted. Bony thorax is unremarkable. IMPRESSION: Left-sided pigtail catheter is noted without pneumothorax. Mild bibasilar subsegmental atelectasis is noted. Small left pleural effusion is noted. Endotracheal and nasogastric tubes are in grossly good position. Electronically Signed   By: Marijo Conception M.D.   On: 03/19/2019 07:55   DG CHEST PORT 1 VIEW  Result Date: 03/16/2019 CLINICAL DATA:  Pneumothorax EXAM: PORTABLE CHEST 1 VIEW COMPARISON:  March 09, 2019 FINDINGS: The heart size and mediastinal contours are unchanged. Aortic knob calcifications. Left-sided pigtail catheter is noted. There is a small left apical pneumothorax present. Mildly increased interstitial markings are seen throughout the left lung. The right lung is clear. Again noted is a right-sided PICC catheter with the tip at the superior cavoatrial junction. Enteric tube is seen coursing below the diaphragm. Overlying median sternotomy wires. IMPRESSION: New small left apical pneumothorax. These results will be called to the ordering clinician or representative by the Radiologist Assistant, and communication documented in the PACS or zVision Dashboard. Electronically Signed   By: Prudencio Pair M.D.   On: 03/27/2019 03:01   DG Chest Port 1 View  Result Date: 03/09/2019 CLINICAL DATA:  Status post left thoracentesis today. EXAM: PORTABLE CHEST 1 VIEW COMPARISON:  Single-view of the chest 03/08/2019. FINDINGS: Feeding tube in pigtail catheter in the left chest are unchanged. Left pleural effusion is decreased after thoracentesis. No pneumothorax. Airspace disease in the left chest persist. Right lung is clear. Cardiomegaly again noted. IMPRESSION: Decreased left effusion after thoracentesis. Negative for pneumothorax. Electronically Signed   By: Inge Rise M.D.   On: 03/09/2019 11:56   DG Abd  Portable 1V  Result Date: 02/26/2019 CLINICAL DATA:  Orogastric tube placement EXAM: PORTABLE ABDOMEN - 1 VIEW COMPARISON:  Abdominal CT 10/13/2018 FINDINGS: Feeding and suction tube tips terminate over the upper stomach. There is a catheter tip over the left sacral ala. The bowel gas pattern is nonobstructive. Small left pleural effusion with lower lobe opacity, reference dedicated chest x-ray. IMPRESSION: Gastric and feeding tube tips overlap the stomach. Electronically Signed   By: Monte Fantasia M.D.   On: 03/06/2019 06:21   IR THORACENTESIS ASP PLEURAL SPACE W/IMG GUIDE  Result Date: 03/09/2019 INDICATION: Post open heart surgery. Persistent left-sided pleural effusion despite chest tube placement. Request for therapeutic thoracentesis. EXAM: ULTRASOUND GUIDED LEFT THORACENTESIS MEDICATIONS: None. COMPLICATIONS: None immediate. PROCEDURE: An ultrasound guided thoracentesis was thoroughly discussed with the patient and questions answered. The benefits, risks, alternatives and complications were also discussed. The patient understands and wishes to proceed with the procedure. Written consent was obtained. Ultrasound was performed to localize and mark an adequate pocket of fluid in the left chest.  The area was then prepped and draped in the normal sterile fashion. 1% Lidocaine was used for local anesthesia. Under ultrasound guidance a 6 Fr Safe-T-Centesis catheter was introduced. Thoracentesis was performed. The catheter was removed and a dressing applied. FINDINGS: A total of approximately 1200 mL of dark bloody fluid was removed. IMPRESSION: Successful ultrasound guided left thoracentesis yielding 1200 mL of pleural fluid. Read by: Ascencion Dike PA-C Electronically Signed   By: Sandi Mariscal M.D.   On: 03/09/2019 12:02   Anti-infectives (From admission, onward)   Start     Dose/Rate Route Frequency Ordered Stop   03/11/19 1200  vancomycin (VANCOREADY) IVPB 750 mg/150 mL     750 mg 150 mL/hr over 60  Minutes Intravenous Every 24 hours 03/26/2019 1153     03/11/19 0000  ceFEPIme (MAXIPIME) 2 g in sodium chloride 0.9 % 100 mL IVPB     2 g 200 mL/hr over 30 Minutes Intravenous Every 12 hours 03/03/2019 1106     03/19/2019 2000  ceFEPIme (MAXIPIME) 2 g in sodium chloride 0.9 % 100 mL IVPB  Status:  Discontinued     2 g 200 mL/hr over 30 Minutes Intravenous Every M-W-F (2000) 03/03/2019 0027 03/20/2019 1106   03/06/2019 1200  vancomycin (VANCOREADY) IVPB 1500 mg/300 mL     1,500 mg 150 mL/hr over 120 Minutes Intravenous  Once 03/09/2019 1153     03/20/2019 0030  ceFEPIme (MAXIPIME) 2 g in sodium chloride 0.9 % 100 mL IVPB     2 g 200 mL/hr over 30 Minutes Intravenous  Once 02/26/2019 0027 03/07/2019 0134   02/25/19 1000  cefUROXime (ZINACEF) 1.5 g in sodium chloride 0.9 % 100 mL IVPB     1.5 g 200 mL/hr over 30 Minutes Intravenous Every 24 hr x 2 02/06/2019 1844 02/26/19 1011   02/13/2019 2030  vancomycin (VANCOCIN) IVPB 1000 mg/200 mL premix  Status:  Discontinued     1,000 mg 200 mL/hr over 60 Minutes Intravenous  Once 02/15/2019 1454 01/30/2019 1500   02/15/2019 1630  cefUROXime (ZINACEF) 1.5 g in sodium chloride 0.9 % 100 mL IVPB  Status:  Discontinued     1.5 g 200 mL/hr over 30 Minutes Intravenous Every 12 hours 02/15/2019 1454 01/29/2019 1844   02/02/2019 0400  vancomycin (VANCOREADY) IVPB 1250 mg/250 mL     1,250 mg 166.7 mL/hr over 90 Minutes Intravenous To Surgery 02/16/2019 1933 01/31/2019 0915   02/01/2019 0400  cefUROXime (ZINACEF) 1.5 g in sodium chloride 0.9 % 100 mL IVPB     1.5 g 200 mL/hr over 30 Minutes Intravenous To Surgery 02/09/2019 1933 02/21/2019 1455   02/02/2019 0400  cefUROXime (ZINACEF) 750 mg in sodium chloride 0.9 % 100 mL IVPB  Status:  Discontinued     750 mg 200 mL/hr over 30 Minutes Intravenous To Surgery 02/02/2019 1931 02/19/2019 1453        Assessment/Plan ESRD on TTS dialysis HTN DM2 Stroke s/p CEA CAD s/p 3 stents S/p CABG and clipping of atrial appendage on 02/11/2019  Septic  shock Abdominal pain - Patient seen and examined with Dr. Ninfa Linden. Patient acutely worsening with septic shock, lactic acidosis, on multiple pressors. Too unstable to go to CT scanner. He is diffusely tender on abdominal exam, suspect possible ischemic intraabdominal source. Plan to take to the OR emergently for exploratory laparotomy.  ID - currently maxipime 1/13>>, vancomycin 1/13>> VTE - SCDs, sq heparin FEN - NPO/OG Foley - none Follow up - TBD   [facility],  PA-C [facility] 03/03/2019, 1:52 PM Please see Amion for pager number during day hours 7:00am-4:30pm

## 2019-03-10 NOTE — Progress Notes (Signed)
[facility] Progress Note Patient Name: Robert [name]. DOB: 19-Jul-1935 MRN: [id number]   Date of Service  03/28/2019  HPI/Events of Note  RN called with reports of hypotension. Complicated patient with abdominal ischemia, s/p surgery. Has had multiple blood products and also in addition, was on 4 pressors at the start of RNs shift. Was max on levophed, neo and vaso and has now maxed out epinephrine as well. Lactate during the day was > 11. RN had just given an amp of bicarbonate. Is intubated and on vent, on CRRT, on IV bicarb infusion.   eICU Interventions  Push 2 more amps of bicarb Continue IV bicarb Increase RR on vent to 24 Check ABG and lactate at 11.30 pm Is already on max pressors, SBP is in 90s Transfuse 3 units FFP Add flagyl  Add anidulafungin Have asked that the surgical team be called and updated Family to be notified as well Prognosis appears extremely guarded      Intervention Category Major Interventions: Shock - evaluation and management  Margaretmary Lombard 03/12/2019, 10:47 PM

## 2019-03-10 NOTE — Transfer of Care (Signed)
Immediate Anesthesia Transfer of Care Note  Patient: Joahan Swatzell.  Procedure(s) Performed: EXPLORATORY LAPAROTOMY (N/A Abdomen) SUBTOTAL COLECTOMY (N/A Abdomen) APPLICATION OF WOUND VAC (N/A Abdomen)  Patient Location: PACU  Anesthesia Type:General  Level of Consciousness: Patient remains intubated per anesthesia plan  Airway & Oxygen Therapy: Patient remains intubated per anesthesia plan and Patient placed on Ventilator (see vital sign flow sheet for setting)  Post-op Assessment: Report given to RN and Post -op Vital signs reviewed and stable  Post vital signs: Reviewed and stable  Last Vitals:  Vitals Value Taken Time  BP    Temp    Pulse    Resp 18 03/08/2019 1602  SpO2    Vitals shown include unvalidated device data.  Last Pain:  Vitals:   03/22/2019 1200  TempSrc:   PainSc: 0-No pain      Patients Stated Pain Goal: 0 (43/60/16 5800)  Complications: No apparent anesthesia complications

## 2019-03-10 NOTE — Progress Notes (Signed)
Per Mannam MD, give patient two units of pRBCs and two units of FFP, blood before FFP. Pressors are maxed out except for epi, which is at 5. Orders received for sedation as well as D5W.   Per [place]hertz MD, okay to restart CRRT without heparin syringe and to keep the patient even.   Hendrickson MD aware of all that has occurred.

## 2019-03-10 NOTE — Progress Notes (Signed)
[facility] NEPHROLOGY PROGRESS NOTE  Assessment/ Plan: Pt is a 84 y.o. yo male with HTN, CAD, stroke, DM, admitted with an NSTEMI and found to have three-vessel CAD status post CABG on 12/30.  Dialysis: [facility] 4h   400/800    73.5kg    3/2.25Ca bath  P4  Hep none L AVF Hectorol 4 Venofer 50 Mircera 75 (last 12/15)  Problems: # Sepsis/ shock - pt on pressors started overnight, poss aspiration, IV abx started.   #CAD/NSTEMI: LHC with severe multivessel CAD status post CABG.  Cardiology, CT surgery following.  # ESRD, HD per TTS schedule normally, changed to CRRT 1/10- 1/11 for hypotension. Now will need to restart CRRT again w/ sepsis.   # Acute hypoxic resp failure - better, L chest tube for large effusion , CXR better today  # Anemia due to CKD: Received PRBC postop.  Aranesp weekly on Tuesdays - note was started 1/5 here   # Secondary hyperparathyroidism: Started hectorol.  Discontinue binders for now   #Paroxysmal A. fib   Sandy Salaam Shota Kohrs 03/08/2019,10:08 AM   Subjective:  +fevers and shock, now back on pressors. CXR today shows clearing of L effusion   Objective Vital signs in last 24 hours: Vitals:   03/28/2019 0930 03/14/2019 0945 02/28/2019 1000 03/01/2019 1015  BP:   93/67   Pulse:      Resp: (!) 26 (!) 24 (!) 24 (!) 21  Temp:      TempSrc:      SpO2:      Weight:      Height:       Physical Exam:   General: Lying in bed , responsive Heart:s1s2 irregular Chest: clear R, dec'd L base Abdomen:soft, Non-tender, non-distended Extremities: mild-mod hip edema  Neuro talkative but confused - answers some questions Dialysis Access: Left upper extremity AV fistula has bruit and thrill.  Medications: Infusions: .  prismasol BGK 4/2.5 400 mL/hr at 03/08/19 1200  .  prismasol BGK 4/2.5 300 mL/hr at 03/08/19 0402  . sodium chloride    . sodium chloride Stopped (02/27/19 1901)  . sodium chloride    . sodium chloride    . albumin human Stopped  (03/03/2019 0345)  . amiodarone Stopped (03/09/2019 0954)  . ceFEPime (MAXIPIME) IV    . dexmedetomidine (PRECEDEX) IV infusion Stopped (03/11/2019 0716)  . feeding supplement (NEPRO CARB STEADY) 1,000 mL (03/09/19 1540)  . norepinephrine (LEVOPHED) Adult infusion 45 mcg/min (03/06/2019 1000)  . prismasol BGK 4/2.5 1,800 mL/hr at 03/08/19 1200  . vasopressin (PITRESSIN) infusion - *FOR SHOCK* 0.04 Units/min (03/16/2019 1000)    Scheduled Medications: . aspirin EC  325 mg Oral Daily   Or  . aspirin  324 mg Per Tube Daily  . B-complex with vitamin C  1 tablet Per Tube Daily  . chlorhexidine gluconate (MEDLINE KIT)  15 mL Mouth Rinse BID  . Chlorhexidine Gluconate Cloth  6 each Topical Q0600  . Chlorhexidine Gluconate Cloth  6 each Topical Q0600  . darbepoetin (ARANESP) injection - DIALYSIS  60 mcg Subcutaneous Q Tue  . doxercalciferol  4 mcg Intravenous Q T,Th,Sa-HD  . enoxaparin (LOVENOX) injection  30 mg Subcutaneous Q48H  . feeding supplement (PRO-STAT SUGAR FREE 64)  30 mL Per Tube BID  . insulin aspart  0-24 Units Subcutaneous Q4H  . levothyroxine  50 mcg Per Tube Q0600  . mouth rinse  15 mL Mouth Rinse 10 times per day  . midodrine  10 mg Per Tube  TID WC  . pantoprazole sodium  40 mg Per Tube Daily  . rosuvastatin  40 mg Per Tube q1800  . sodium chloride flush  10-40 mL Intracatheter Q12H  . sodium chloride flush  3 mL Intravenous Q12H    have reviewed scheduled and prn medications.

## 2019-03-10 NOTE — Progress Notes (Signed)
CT Surgery ICU note  patient required controlled intubation by anesthesia for airway protection and hypercarbia following episodes of emesis and fever. CXR after L thoracentesis satisfactory. Tube feeds stopped, patient cultured and empiric antibiotics started.. NG to suction and levophed increased , vasopressin added for prob sepsis. P Prescott Gum MD

## 2019-03-10 NOTE — Anesthesia Postprocedure Evaluation (Signed)
Anesthesia Post Note  Patient: Robert [name].  Procedure(s) Performed: EXPLORATORY LAPAROTOMY (N/A Abdomen) SUBTOTAL COLECTOMY (N/A Abdomen) APPLICATION OF WOUND VAC (N/A Abdomen)     Patient location during evaluation: ICU Anesthesia Type: General Level of consciousness: patient remains intubated per anesthesia plan Vital Signs Assessment: vitals unstable Respiratory status: respiratory function stable, patient on ventilator - see flowsheet for VS and patient remains intubated per anesthesia plan Cardiovascular status: blood pressure returned to baseline and stable Anesthetic complications: no Comments: Transported to cardiac ICU intubated on 3 pressors with 2 units of blood in cooler and FFP/cryo available in blood bank.    Last Vitals:  Vitals:   02/28/2019 1550 03/12/2019 1630  BP: (!) 145/77   Pulse: (!) 108 (!) 59  Resp: 18 18  Temp:  (!) 34.3 C  SpO2: 91% 95%    Last Pain:  Vitals:   03/12/2019 1200  TempSrc:   PainSc: 0-No pain                 Pervis Hocking

## 2019-03-10 NOTE — Addendum Note (Signed)
Addendum  created 03/28/2019 7680 by Roberts Gaudy, MD   Clinical Note Signed

## 2019-03-10 NOTE — Anesthesia Preprocedure Evaluation (Signed)
Anesthesia Evaluation  Patient identified by MRN, date of birth, ID band Patient unresponsive    Reviewed: Allergy & Precautions, Patient's Chart, lab work & pertinent test resultsPreop documentation limited or incomplete due to emergent nature of procedure.  Airway Mallampati: Intubated       Dental   Pulmonary shortness of breath, former smoker,    breath sounds clear to auscultation       Cardiovascular hypertension, Pt. on medications and Pt. on home beta blockers + CAD, + Past MI, + Cardiac Stents and +CHF   Rhythm:Regular Rate:Normal  S/p CABG 13d ago   Neuro/Psych PSYCHIATRIC DISORDERS Depression  Neuromuscular disease CVA    GI/Hepatic Neg liver ROS, GERD  Medicated,Perforated bowel with subsequent sepsis- emergent exlap   Endo/Other  diabetes, Type 2, Insulin DependentHypothyroidism   Renal/GU ESRF and DialysisRenal disease  negative genitourinary   Musculoskeletal negative musculoskeletal ROS (+)   Abdominal Normal abdominal exam  (+)   Peds  Hematology  (+) anemia ,   Anesthesia Other Findings   Reproductive/Obstetrics negative OB ROS                             Lab Results  Component Value Date   WBC 9.2 03/09/2019   HGB 7.0 (L) 03/20/2019   HCT 24.8 (L) 03/03/2019   MCV 108.3 (H) 03/23/2019   PLT PLATELET CLUMPS NOTED ON SMEAR, UNABLE TO ESTIMATE 03/09/2019   Lab Results  Component Value Date   CREATININE 5.29 (H) 03/23/2019   BUN 82 (H) 03/26/2019   NA 137 03/03/2019   K 4.2 03/02/2019   CL 99 03/21/2019   CO2 21 (L) 03/12/2019     Anesthesia Physical  Anesthesia Plan  ASA: IV  Anesthesia Plan: General   Post-op Pain Management:    Induction: Inhalational  PONV Risk Score and Plan: 3 and Midazolam and Treatment may vary due to age or medical condition  Airway Management Planned: Oral ETT  Additional Equipment: Arterial line  Intra-op Plan:    Post-operative Plan: Post-operative intubation/ventilation  Informed Consent: I have reviewed the patients History and Physical, chart, labs and discussed the procedure including the risks, benefits and alternatives for the proposed anesthesia with the patient or authorized representative who has indicated his/her understanding and acceptance.     Dental advisory given  Plan Discussed with: CRNA  Anesthesia Plan Comments: (Septic 2/2 abdominal perforation, arrived to OR with SpO2 undetectable on max dose 4 pressors. )       Anesthesia Quick Evaluation

## 2019-03-10 NOTE — Progress Notes (Signed)
Patient ID: [name], male   DOB: 1935/08/11, 84 y.o.   MRN: [id number] On multiple pressors, ill. Vac with 450 bloody fluid out, temp 35 degrees Getting blood and ffp, will add platelets, labs pending Needs coagulopathy and hypothermia fixed as first step If continues to bleed consider return to OR

## 2019-03-10 NOTE — Progress Notes (Addendum)
Pharmacy Antibiotic Note  Robert [name]. is a 84 y.o. male admitted on 02/16/2019 initially  With new Afib, now spiking fevers.  Pharmacy has been consulted for cefepime dosing due to concern for pneumonia.  WBC is elevated at 12.9. Pt is afebrile.BP remains to be low at 97/72. [name] is on norepi at 45 mcg/min and vasopressin at 0.04 units/minute.  Bcx have been sent.  Plan: Cefepime 2g Q 12 hours with CRRT. Follow cultures, WBC, length of therapy, and clinical status F/U LOT  Height: 5\' 8"  (172.7 cm) Weight: 164 lb 7.4 oz (74.6 kg)(Informed Dr. Jonnie Finner) IBW/kg (Calculated) : 68.4  Temp (24hrs), Avg:99 F (37.2 C), Min:97.7 F (36.5 C), Max:101 F (38.3 C)  Recent Labs  Lab 03/03/19 0535 03/03/2019 0218 03/05/19 0442 03/07/19 1610 03/08/19 0444 03/08/19 1600 03/09/19 0426 03/09/19 0427 03/09/19 1549  WBC 6.6 7.5 8.0  --  9.3  --   --  11.4*  --   CREATININE 4.97* 5.73* 6.93* 4.30* 2.80* 2.69* 3.60*  --  4.33*    Estimated Creatinine Clearance: 12.9 mL/min (A) (by C-G formula based on SCr of 4.33 mg/dL (H)).    Allergies  Allergen Reactions  . Lisinopril Other (See Comments)    "made me lose my voice"     Thank you for allowing pharmacy to be a part of this patient's care.  Sherren Kerns, PharmD PGY1 Acute Care Pharmacy Resident 03/11/2019 12:26 AM   Addendum: Pharmacy consulted to add vancomycin for sepsis. Pt's LA is elevated at 7.5.  Plan: Give vancomycin 1500 mg IV loading dose, then vancomycin 750 mg IV q24h while on CRRT. Monitor cultures, clinical status, and renal plans Vanc levels at steady state  Berenice Bouton, PharmD PGY1 Pharmacy Resident  Please check AMION for all [facility] phone numbers After 10:00 PM, call [facility] [phone]  03/19/2019  11:50 AM

## 2019-03-10 NOTE — Progress Notes (Signed)
Patient ID: Robert [name]., male   DOB: [birth year], 84 y.o.   MRN: [id number]   I have called the patient's son and explained to him the intraoperative findings.  The patient remains critically ill.  He has a wound VAC in place.  The plan will be to return to the operating room in the next 24 to 48 hours to hopefully then closed the abdomen and perform an ileostomy if possible.  The son seems to understand the gravity of the situation.

## 2019-03-10 NOTE — Progress Notes (Signed)
      [address]       [place] [address]             [phone]      S/p subtotal colectomy  Intubated  BP (!) 122/31   Pulse 63   Temp (!) 95.2 F (35.1 C)   Resp 18   Ht 5\' 8"  (1.727 m)   Wt 74.6 kg Comment: Informed Dr. Jonnie Finner  SpO2 97%   BMI 25.01 kg/m   On neo, levophed, epi and vasopressin  Severe metabolic acidosis, anemia, coagulopathy and thrombocytopenia in setting overwhelming sepsis.  Resuscitation and correction of coagulopathy underway  Prognosis guarded  Remo Lipps C. Roxan Hockey, MD [facility] [phone]

## 2019-03-10 NOTE — Progress Notes (Signed)
[address]       ,[place] [address]             [phone]                 14 Days Post-Op Procedure(s) (LRB): CORONARY ARTERY BYPASS GRAFTING (CABG) using endoscopic greater saphenous vein harvest: SVC to Diag1; SVC to OM1; SVC to PDA and LIMA to LAD. (N/A) Clipping Of Atrial Appendage using 45 MM AtriCure AtriClip (N/A)   Events: Intubated overnight following aspiration event Vasopressor support increased _______________________________________________________________ Vitals: BP 123/87 (BP Location: Right Arm)   Pulse (!) 117   Temp (!) 97.4 F (36.3 C) (Oral)   Resp (!) 28   Ht 5\' 8"  (1.727 m)   Wt 74.6 kg Comment: Informed Dr. Jonnie Finner  SpO2 100%   BMI 25.01 kg/m   - Neuro: sedated  - Cardiovascular: sinus tach Levo at 45 Vaso 0.04 Mild drainage from inferior tip of incision  .   CVP:  [8 mmHg-14 mmHg] 14 mmHg  - Pulm: coarse BS Vent Mode: PRVC FiO2 (%):  [80 %-100 %] 80 % Set Rate:  [14 bmp-18 bmp] 18 bmp Vt Set:  [650 mL] 650 mL PEEP:  [12 cmH20] 12 cmH20 Plateau Pressure:  [26 cmH20] 26 cmH20  ABG    Component Value Date/Time   PHART 7.221 (L) 03/28/2019 0601   PCO2ART 54.7 (H) 03/24/2019 0601   PO2ART 172.0 (H) 03/18/2019 0601   HCO3 22.5 03/02/2019 0601   TCO2 24 03/19/2019 0601   ACIDBASEDEF 5.0 (H) 02/26/2019 0601   O2SAT 69.0 03/12/2019 0631    - Abd: soft - Extremity: edematous  .Intake/Output      01/12 0701 - 01/13 0700 01/13 0701 - 01/14 0700   I.V. (mL/kg) 671.1 (9) 125.8 (1.7)   NG/GT     IV Piggyback 500    Total Intake(mL/kg) 1171.1 (15.7) 125.8 (1.7)   Urine (mL/kg/hr) 0 (0)    Emesis/NG output 450    Other     Stool     Chest Tube 0 0   Total Output 450 0   Net +721.1 +125.8        Emesis Occurrence 2 x       _______________________________________________________________ Labs: CBC Latest Ref Rng & Units 03/17/2019 03/09/2019 03/18/2019  WBC 4.0 - 10.5 K/uL - - 12.9(H)  Hemoglobin 13.0 -  17.0 g/dL 9.9(L) 9.9(L) 8.8(L)  Hematocrit 39.0 - 52.0 % 29.0(L) 29.0(L) 31.5(L)  Platelets 150 - 400 K/uL - - 151   CMP Latest Ref Rng & Units 03/20/2019 03/24/2019 03/07/2019  Glucose 70 - 99 mg/dL - - 140(H)  BUN 8 - 23 mg/dL - - 82(H)  Creatinine 0.61 - 1.24 mg/dL - - 5.29(H)  Sodium 135 - 145 mmol/L 137 133(L) 138  Potassium 3.5 - 5.1 mmol/L 4.4 4.2 4.1  Chloride 98 - 111 mmol/L - - 99  CO2 22 - 32 mmol/L - - 21(L)  Calcium 8.9 - 10.3 mg/dL - - 8.0(L)  Total Protein 6.5 - 8.1 g/dL - - -  Total Bilirubin 0.3 - 1.2 mg/dL - - -  Alkaline Phos 38 - 126 U/L - - -  AST 15 - 41 U/L - - -  ALT 0 - 44 U/L - - -    CXR: Clear B  _______________________________________________________________  Assessment and Plan: POD 14 s/p CABG  Neuro: sedated while on vent CV: on vas and high dose levo.  Pressure improved  with volume.  Will wean as needed Pulm: on high vent setting.  1.2L pulled from thoracentesis.  CT not doing much so will remove.  Appreciate PCCM recs Renal: will hold off on dialysis today due to pressure GI: will hold tube feeds Heme: stable ID: septic likely from aspiration pneumonia.  Cultures pending, abx started.   Dispo: continue ICU care.  Melodie Bouillon, MD 03/26/2019 9:05 AM

## 2019-03-10 NOTE — Op Note (Signed)
EXPLORATORY LAPAROTOMY, SUBTOTAL COLECTOMY, APPLICATION OF WOUND VAC  Procedure Note  Robert [name]. 03/22/2019   Pre-op Diagnosis: acute abdomen     Post-op Diagnosis: Infarcted colon  Procedure(s): EXPLORATORY LAPAROTOMY SUBTOTAL COLECTOMY APPLICATION OF WOUND VAC  Surgeon(s): Coralie Keens, MD  Anesthesia: General  Staff:  Circulator: Rosanne Sack, RN; Hitchcock, Delight Stare, RN Physician Assistant: Wellington Hampshire, PA-C Scrub Person: Rozell Searing, RN Circulator Assistant: Gleason, Westly Pam, RN; Celene Squibb, RN  Estimated Blood Loss: 300 mL               Specimens: sent to path  Indications: This is an 84 year old gentleman with multiple medical problems on hemodialysis who is status post a CABG on 12/30.  Earlier today, he developed a worsening respiratory status and had to be emergently intubated.  He then became hypotensive and was on multiple pressors.  Lactic acid level was elevated greater than 7.  He appeared to have an acutely tender abdomen on physical examination so decision was made to proceed to the operating room for an emergent exploration.  Findings: Patient had an ischemic and infarcted colon from the cecum to the rectum.  There were also signs of previous diverticulitis and thickening of the distal sigmoid colon and rectum.  The small bowel appeared normal.  Procedure: The patient was brought to the operating room and identifies correct patient.  He was placed upon the operating table general anesthesia was induced.  His abdomen was then prepped and draped in usual sterile fashion.  I created midline incision with a scalpel.  I took this down through the subcutaneous tissue and fascia as well as peritoneum the entire length of the incision.  Upon entering the abdomen the small bowel is eviscerated.  The small bowel appeared pink and viable.  His entire colon, however, appeared ischemic with areas of gross infarction especially the distal  transverse colon.  At this point, the decision was made to proceed with a subtotal colectomy.  The sigmoid colon and rectum were thickened.  He appears to have had previous episodes of diverticulitis.  I took down several adhesions with the electrocautery.  I then was able to come around the distal rectum and then transected it with the contour stapler.  I then mobilized the left colon along the white line of [place]oldt with the cautery and took down the splenic flexure as well.  I then transected the distal small bowel with a GIA-75 stapler and mobilized the cecum and right colon along the white line of Toldt as well.  I then took down the hepatic flexure.  Once I had the colon completely mobilized, I took down the entire mesentery with the LigaSure device.  I had to suture ligate several vessels in the mesentery with 2-0 silk sutures.  Once the entire specimen was removed, it was then sent to pathology for evaluation.  We then copiously irrigated the abdomen with several liters normal saline.  We appeared achieved hemostasis with the cautery, LigaSure, and silk sutures.  I did place a large piece of Surgicel at the rectal stump where there was some oozing.  At this point the decision was made to leave the abdomen open.  The large wound VAC device was brought to the field.  I placed it into the abdomen and into all 4 quadrants.  I then placed 2 sponges over the top of this.  It was then secured in place with adhesive.  I then made a small incision in  this and placed the suction tubing to the overlying sponge.  This was then placed to the [facility] device which was then tested.  A good seal appeared to be achieved.  At this point, the patient remained in critical condition.  All counts were correct at the end of the procedure.  The patient was then taken, still intubated, back to the intensive care unit.          Coralie Keens   Date: 03/21/2019  Time: 3:34 PM

## 2019-03-10 NOTE — Progress Notes (Signed)
Progress Note  Patient Name: Robert [name]. Date of Encounter: 03/24/2019  Primary Cardiologist: Shirlee More, MD   Subjective   Patient required intubation this am for acute hypercarbic respiratory failure. In septic shock. Increasing pressors.   Inpatient Medications    Scheduled Meds: . aspirin EC  325 mg Oral Daily   Or  . aspirin  324 mg Per Tube Daily  . B-complex with vitamin C  1 tablet Per Tube Daily  . chlorhexidine gluconate (MEDLINE KIT)  15 mL Mouth Rinse BID  . Chlorhexidine Gluconate Cloth  6 each Topical Q0600  . Chlorhexidine Gluconate Cloth  6 each Topical Q0600  . darbepoetin (ARANESP) injection - DIALYSIS  60 mcg Subcutaneous Q Tue  . doxercalciferol  4 mcg Intravenous Q T,Th,Sa-HD  . enoxaparin (LOVENOX) injection  30 mg Subcutaneous Q48H  . feeding supplement (PRO-STAT SUGAR FREE 64)  30 mL Per Tube BID  . insulin aspart  0-24 Units Subcutaneous Q4H  . levothyroxine  50 mcg Per Tube Q0600  . mouth rinse  15 mL Mouth Rinse 10 times per day  . midodrine  10 mg Per Tube TID WC  . pantoprazole sodium  40 mg Per Tube Daily  . rosuvastatin  40 mg Per Tube q1800  . sodium chloride flush  10-40 mL Intracatheter Q12H  . sodium chloride flush  3 mL Intravenous Q12H   Continuous Infusions: .  prismasol BGK 4/2.5 400 mL/hr at 03/08/19 1200  .  prismasol BGK 4/2.5 300 mL/hr at 03/08/19 0402  . sodium chloride    . sodium chloride Stopped (02/27/19 1901)  . sodium chloride    . sodium chloride    . albumin human Stopped (03/25/2019 0345)  . amiodarone 30 mg/hr (03/05/2019 0600)  . ceFEPime (MAXIPIME) IV    . dexmedetomidine (PRECEDEX) IV infusion 0.4 mcg/kg/hr (03/20/2019 0626)  . feeding supplement (NEPRO CARB STEADY) 1,000 mL (03/09/19 1540)  . norepinephrine (LEVOPHED) Adult infusion 40 mcg/min (02/28/2019 0600)  . prismasol BGK 4/2.5 1,800 mL/hr at 03/08/19 1200  . vasopressin (PITRESSIN) infusion - *FOR SHOCK* 0.04 Units/min (03/08/2019 0639)   PRN  Meds: sodium chloride, sodium chloride, albuterol, alteplase, budesonide (PULMICORT) nebulizer solution, dextrose, heparin, heparin, lidocaine (PF), lidocaine (PF), lidocaine-prilocaine, metoprolol tartrate, morphine injection, ondansetron (ZOFRAN) IV, pentafluoroprop-tetrafluoroeth, sodium chloride flush, sodium chloride flush   Vital Signs    Vitals:   03/05/2019 0530 02/27/2019 0545 03/14/2019 0600 03/09/2019 0700  BP: (!) 89/46 (!) 93/55 (!) 116/48   Pulse: (!) 118 (!) 121 (!) 124   Resp: 14 (!) 22 (!) 25   Temp:    (!) 97.4 F (36.3 C)  TempSrc:    Oral  SpO2: 91% 94% 98%   Weight:      Height:        Intake/Output Summary (Last 24 hours) at 03/14/2019 0800 Last data filed at 03/11/2019 0700 Gross per 24 hour  Intake 1171.09 ml  Output 450 ml  Net 721.09 ml   Last 3 Weights 03/08/2019 03/08/2019 03/07/2019  Weight (lbs) 164 lb 7.4 oz 162 lb 4.1 oz 170 lb 13.7 oz  Weight (kg) 74.6 kg 73.6 kg 77.5 kg      Telemetry    Afib with RVR in 120s- Personally Reviewed  Physical Exam   GEN: Elderly WM, intubated.  Neck: No JVD, supple. Dialysis catheter in place Cardiac: IRRR- rapid, 2/6 systolic murmur RUSB; no DM Respiratory: clear GI: Soft, NT/ND MS: No edema Neuro: sedated on vent.  Labs    High Sensitivity Troponin:   Recent Labs  Lab 01/27/2019 1736 02/09/2019 1922 02/25/2019 2200 02/17/2019 0124 02/17/2019 0955  TROPONINIHS 81* 856* 4,043* 4,270* 4,018*      Chemistry Recent Labs  Lab 03/05/19 1300 03/09/19 0426 03/09/19 1549 03/19/2019 0410 03/06/2019 0432 03/08/2019 0601  NA  --  137 137 138 133* 137  K  --  3.7 3.6 4.1 4.2 4.4  CL  --  102 100 99  --   --   CO2  --  25 25 21*  --   --   GLUCOSE  --  158* 160* 140*  --   --   BUN  --  53* 68* 82*  --   --   CREATININE  --  3.60* 4.33* 5.29*  --   --   CALCIUM  --  8.1* 8.0* 8.0*  --   --   PROT 4.6*  --   --   --   --   --   ALBUMIN 2.0* 2.1* 2.0* 2.0*  --   --   AST 57*  --   --   --   --   --   ALT 47*  --   --    --   --   --   ALKPHOS 75  --   --   --   --   --   BILITOT 0.2*  --   --   --   --   --   GFRNONAA  --  15* 12* 9*  --   --   GFRAA  --  17* 14* 11*  --   --   ANIONGAP  --  10 12 18*  --   --      Hematology Recent Labs  Lab 03/08/19 0444 03/09/19 0427 03/02/2019 0410 03/11/2019 0432 03/25/2019 0601  WBC 9.3 11.4* 12.9*  --   --   RBC 2.83* 2.79* 2.97*  --   --   HGB 8.5* 8.2* 8.8* 9.9* 9.9*  HCT 27.9* 29.1* 31.5* 29.0* 29.0*  MCV 98.6 104.3* 106.1*  --   --   MCH 30.0 29.4 29.6  --   --   MCHC 30.5 28.2* 27.9*  --   --   RDW 20.1* 18.8* 18.4*  --   --   PLT 110* 126* 151  --   --     Patient Profile     Robert [name]. is a 84 y.o. male with ESRD, HTN, CAD s/p PCI, Stroke s/p CEA, DM admitted 12/29 for new-onset Afib. Subsequently had NSTEMI and found to have 3vCAD. S/p CABG x 4 02/20/2019.  Note preoperative echocardiogram showed normal LV function, mild mitral regurgitation, moderate aortic stenosis.  Assessment & Plan    1.  Atrial fibrillation with RVR recurrent. Had converted post op on amiodarone. Now on amiodarone IV. Unable to give Cardizem or beta blocker due to shock. He did have LA clipping. [place] vasc score of 7. Consider Coumadin when okay with surgery. Still has chest tube in place.   2.  NSTEMI/ with severe 3 vessel coronary artery disease status post coronary artery bypass graft-continue ASA  and Crestor 40 mg daily.  3.  Moderate aortic stenosis-follow-up as an outpatient. May need TAVR in the future.  4.  End-stage renal disease-dialysis per nephrology.  5.  Septic shock requiring pressors. On antibiotics.   6.  Acute hypercarbic respiratory failure. Intubated this am. On ventilator support.  7. HLD on high dose statin.  8. History of stroke status post carotid endarterectomy.  For questions or updates, please contact [facility] Please consult [email or web address] for contact info under        Signed, Harlow Carrizales [place], MD  03/17/2019, 8:00 AM

## 2019-03-11 ENCOUNTER — Encounter: Payer: Self-pay | Admitting: *Deleted

## 2019-03-11 DIAGNOSIS — R6521 Severe sepsis with septic shock: Secondary | ICD-10-CM

## 2019-03-11 DIAGNOSIS — A419 Sepsis, unspecified organism: Secondary | ICD-10-CM

## 2019-03-11 LAB — POCT I-STAT 7, (LYTES, BLD GAS, ICA,H+H)
Acid-Base Excess: 1 mmol/L (ref 0.0–2.0)
Acid-Base Excess: 1 mmol/L (ref 0.0–2.0)
Acid-base deficit: 14 mmol/L — ABNORMAL HIGH (ref 0.0–2.0)
Acid-base deficit: 16 mmol/L — ABNORMAL HIGH (ref 0.0–2.0)
Acid-base deficit: 10 mmol/L — ABNORMAL HIGH (ref 0.0–2.0)
Acid-base deficit: 11 mmol/L — ABNORMAL HIGH (ref 0.0–2.0)
Acid-base deficit: 12 mmol/L — ABNORMAL HIGH (ref 0.0–2.0)
Acid-base deficit: 5 mmol/L — ABNORMAL HIGH (ref 0.0–2.0)
Acid-base deficit: 4 mmol/L — ABNORMAL HIGH (ref 0.0–2.0)
Bicarbonate: 12.9 mmol/L — ABNORMAL LOW (ref 20.0–28.0)
Bicarbonate: 17.1 mmol/L — ABNORMAL LOW (ref 20.0–28.0)
Bicarbonate: 19.4 mmol/L — ABNORMAL LOW (ref 20.0–28.0)
Bicarbonate: 15 mmol/L — ABNORMAL LOW (ref 20.0–28.0)
Bicarbonate: 15.8 mmol/L — ABNORMAL LOW (ref 20.0–28.0)
Bicarbonate: 20.9 mmol/L (ref 20.0–28.0)
Bicarbonate: 21.8 mmol/L (ref 20.0–28.0)
Bicarbonate: 25.6 mmol/L (ref 20.0–28.0)
Bicarbonate: 25.3 mmol/L (ref 20.0–28.0)
Calcium, Ion: 0.84 mmol/L — CL (ref 1.15–1.40)
Calcium, Ion: 0.91 mmol/L — ABNORMAL LOW (ref 1.15–1.40)
Calcium, Ion: 1.06 mmol/L — ABNORMAL LOW (ref 1.15–1.40)
Calcium, Ion: 0.79 mmol/L — CL (ref 1.15–1.40)
Calcium, Ion: 0.83 mmol/L — CL (ref 1.15–1.40)
Calcium, Ion: 0.92 mmol/L — ABNORMAL LOW (ref 1.15–1.40)
Calcium, Ion: 0.8 mmol/L — CL (ref 1.15–1.40)
Calcium, Ion: 0.82 mmol/L — CL (ref 1.15–1.40)
Calcium, Ion: 0.89 mmol/L — CL (ref 1.15–1.40)
HCT: 20 % — ABNORMAL LOW (ref 39.0–52.0)
HCT: 21 % — ABNORMAL LOW (ref 39.0–52.0)
HCT: 22 % — ABNORMAL LOW (ref 39.0–52.0)
HCT: 22 % — ABNORMAL LOW (ref 39.0–52.0)
HCT: 22 % — ABNORMAL LOW (ref 39.0–52.0)
HCT: 18 % — ABNORMAL LOW (ref 39.0–52.0)
HCT: 22 % — ABNORMAL LOW (ref 39.0–52.0)
HCT: 21 % — ABNORMAL LOW (ref 39.0–52.0)
HCT: 19 % — ABNORMAL LOW (ref 39.0–52.0)
Hemoglobin: 6.8 g/dL — CL (ref 13.0–17.0)
Hemoglobin: 7.1 g/dL — ABNORMAL LOW (ref 13.0–17.0)
Hemoglobin: 7.5 g/dL — ABNORMAL LOW (ref 13.0–17.0)
Hemoglobin: 7.5 g/dL — ABNORMAL LOW (ref 13.0–17.0)
Hemoglobin: 7.5 g/dL — ABNORMAL LOW (ref 13.0–17.0)
Hemoglobin: 6.1 g/dL — CL (ref 13.0–17.0)
Hemoglobin: 7.5 g/dL — ABNORMAL LOW (ref 13.0–17.0)
Hemoglobin: 7.1 g/dL — ABNORMAL LOW (ref 13.0–17.0)
Hemoglobin: 6.5 g/dL — CL (ref 13.0–17.0)
O2 Saturation: 100 %
O2 Saturation: 96 %
O2 Saturation: 100 %
O2 Saturation: 94 %
O2 Saturation: 95 %
O2 Saturation: 95 %
O2 Saturation: 95 %
O2 Saturation: 97 %
O2 Saturation: 94 %
Patient temperature: 37.3
Patient temperature: 37.4
Patient temperature: 35.7
Patient temperature: 36
Patient temperature: 36.7
Patient temperature: 36.5
Potassium: 3.8 mmol/L (ref 3.5–5.1)
Potassium: 4.4 mmol/L (ref 3.5–5.1)
Potassium: 4.3 mmol/L (ref 3.5–5.1)
Potassium: 4 mmol/L (ref 3.5–5.1)
Potassium: 4.1 mmol/L (ref 3.5–5.1)
Potassium: 3.5 mmol/L (ref 3.5–5.1)
Potassium: 3.9 mmol/L (ref 3.5–5.1)
Potassium: 3.8 mmol/L (ref 3.5–5.1)
Potassium: 3.6 mmol/L (ref 3.5–5.1)
Sodium: 140 mmol/L (ref 135–145)
Sodium: 141 mmol/L (ref 135–145)
Sodium: 144 mmol/L (ref 135–145)
Sodium: 142 mmol/L (ref 135–145)
Sodium: 142 mmol/L (ref 135–145)
Sodium: 144 mmol/L (ref 135–145)
Sodium: 142 mmol/L (ref 135–145)
Sodium: 141 mmol/L (ref 135–145)
Sodium: 140 mmol/L (ref 135–145)
TCO2: 14 mmol/L — ABNORMAL LOW (ref 22–32)
TCO2: 19 mmol/L — ABNORMAL LOW (ref 22–32)
TCO2: 21 mmol/L — ABNORMAL LOW (ref 22–32)
TCO2: 16 mmol/L — ABNORMAL LOW (ref 22–32)
TCO2: 17 mmol/L — ABNORMAL LOW (ref 22–32)
TCO2: 22 mmol/L (ref 22–32)
TCO2: 23 mmol/L (ref 22–32)
TCO2: 27 mmol/L (ref 22–32)
TCO2: 26 mmol/L (ref 22–32)
pCO2 arterial: 45 mmHg (ref 32.0–48.0)
pCO2 arterial: 73.9 mmHg (ref 32.0–48.0)
pCO2 arterial: 67.5 mmHg (ref 32.0–48.0)
pCO2 arterial: 41.3 mmHg (ref 32.0–48.0)
pCO2 arterial: 41.7 mmHg (ref 32.0–48.0)
pCO2 arterial: 41.4 mmHg (ref 32.0–48.0)
pCO2 arterial: 41.3 mmHg (ref 32.0–48.0)
pCO2 arterial: 41.3 mmHg (ref 32.0–48.0)
pCO2 arterial: 37 mmHg (ref 32.0–48.0)
pH, Arterial: 6.973 — CL (ref 7.350–7.450)
pH, Arterial: 7.065 — CL (ref 7.350–7.450)
pH, Arterial: 7.067 — CL (ref 7.350–7.450)
pH, Arterial: 7.171 — CL (ref 7.350–7.450)
pH, Arterial: 7.188 — CL (ref 7.350–7.450)
pH, Arterial: 7.305 — ABNORMAL LOW (ref 7.350–7.450)
pH, Arterial: 7.326 — ABNORMAL LOW (ref 7.350–7.450)
pH, Arterial: 7.398 (ref 7.350–7.450)
pH, Arterial: 7.442 (ref 7.350–7.450)
pO2, Arterial: 112 mmHg — ABNORMAL HIGH (ref 83.0–108.0)
pO2, Arterial: 278 mmHg — ABNORMAL HIGH (ref 83.0–108.0)
pO2, Arterial: 317 mmHg — ABNORMAL HIGH (ref 83.0–108.0)
pO2, Arterial: 92 mmHg (ref 83.0–108.0)
pO2, Arterial: 97 mmHg (ref 83.0–108.0)
pO2, Arterial: 75 mmHg — ABNORMAL LOW (ref 83.0–108.0)
pO2, Arterial: 76 mmHg — ABNORMAL LOW (ref 83.0–108.0)
pO2, Arterial: 90 mmHg (ref 83.0–108.0)
pO2, Arterial: 66 mmHg — ABNORMAL LOW (ref 83.0–108.0)

## 2019-03-11 LAB — GLUCOSE, CAPILLARY
Glucose-Capillary: 103 mg/dL — ABNORMAL HIGH (ref 70–99)
Glucose-Capillary: 130 mg/dL — ABNORMAL HIGH (ref 70–99)
Glucose-Capillary: 140 mg/dL — ABNORMAL HIGH (ref 70–99)
Glucose-Capillary: 152 mg/dL — ABNORMAL HIGH (ref 70–99)
Glucose-Capillary: 176 mg/dL — ABNORMAL HIGH (ref 70–99)
Glucose-Capillary: 212 mg/dL — ABNORMAL HIGH (ref 70–99)
Glucose-Capillary: 217 mg/dL — ABNORMAL HIGH (ref 70–99)
Glucose-Capillary: 221 mg/dL — ABNORMAL HIGH (ref 70–99)
Glucose-Capillary: 230 mg/dL — ABNORMAL HIGH (ref 70–99)
Glucose-Capillary: 235 mg/dL — ABNORMAL HIGH (ref 70–99)
Glucose-Capillary: 253 mg/dL — ABNORMAL HIGH (ref 70–99)
Glucose-Capillary: 274 mg/dL — ABNORMAL HIGH (ref 70–99)
Glucose-Capillary: 91 mg/dL (ref 70–99)
Glucose-Capillary: <10 mg/dL — CL (ref 70–99)

## 2019-03-11 LAB — BPAM FFP
Blood Product Expiration Date: 202101132359
Blood Product Expiration Date: 202101132359
Blood Product Expiration Date: 202101182359
Blood Product Expiration Date: 202101182359
Blood Product Expiration Date: 202101182359
Blood Product Expiration Date: 202101182359
Blood Product Expiration Date: 202101182359
Blood Product Expiration Date: 202101182359
ISSUE DATE / TIME: 202101131513
ISSUE DATE / TIME: 202101131513
ISSUE DATE / TIME: 202101131810
ISSUE DATE / TIME: 202101131810
ISSUE DATE / TIME: 202101132308
ISSUE DATE / TIME: 202101132308
ISSUE DATE / TIME: 202101132308
ISSUE DATE / TIME: 202101132308
Unit Type and Rh: 6200
Unit Type and Rh: 6200
Unit Type and Rh: 6200
Unit Type and Rh: 6200
Unit Type and Rh: 6200
Unit Type and Rh: 6200
Unit Type and Rh: 6200
Unit Type and Rh: 6200

## 2019-03-11 LAB — CBC
HCT: 25.1 % — ABNORMAL LOW (ref 39.0–52.0)
HCT: 22.8 % — ABNORMAL LOW (ref 39.0–52.0)
Hemoglobin: 7.9 g/dL — ABNORMAL LOW (ref 13.0–17.0)
Hemoglobin: 7.3 g/dL — ABNORMAL LOW (ref 13.0–17.0)
MCH: 29.2 pg (ref 26.0–34.0)
MCH: 29.1 pg (ref 26.0–34.0)
MCHC: 31.5 g/dL (ref 30.0–36.0)
MCHC: 32 g/dL (ref 30.0–36.0)
MCV: 92.6 fL (ref 80.0–100.0)
MCV: 90.8 fL (ref 80.0–100.0)
Platelets: 56 10*3/uL — ABNORMAL LOW (ref 150–400)
Platelets: 44 10*3/uL — ABNORMAL LOW (ref 150–400)
RBC: 2.71 MIL/uL — ABNORMAL LOW (ref 4.22–5.81)
RBC: 2.51 MIL/uL — ABNORMAL LOW (ref 4.22–5.81)
RDW: 18.1 % — ABNORMAL HIGH (ref 11.5–15.5)
RDW: 18.3 % — ABNORMAL HIGH (ref 11.5–15.5)
WBC: 16.4 10*3/uL — ABNORMAL HIGH (ref 4.0–10.5)
WBC: 15.5 10*3/uL — ABNORMAL HIGH (ref 4.0–10.5)
nRBC: 23.5 % — ABNORMAL HIGH (ref 0.0–0.2)
nRBC: 23.6 % — ABNORMAL HIGH (ref 0.0–0.2)

## 2019-03-11 LAB — PREPARE FRESH FROZEN PLASMA
Unit division: 0
Unit division: 0
Unit division: 0
Unit division: 0
Unit division: 0
Unit division: 0

## 2019-03-11 LAB — RENAL FUNCTION PANEL
Albumin: 1.9 g/dL — ABNORMAL LOW (ref 3.5–5.0)
Albumin: 2 g/dL — ABNORMAL LOW (ref 3.5–5.0)
Anion gap: 32 — ABNORMAL HIGH (ref 5–15)
Anion gap: 28 — ABNORMAL HIGH (ref 5–15)
BUN: 50 mg/dL — ABNORMAL HIGH (ref 8–23)
BUN: 32 mg/dL — ABNORMAL HIGH (ref 8–23)
CO2: 15 mmol/L — ABNORMAL LOW (ref 22–32)
CO2: 23 mmol/L (ref 22–32)
Calcium: 6.4 mg/dL — CL (ref 8.9–10.3)
Calcium: 6.8 mg/dL — ABNORMAL LOW (ref 8.9–10.3)
Chloride: 98 mmol/L (ref 98–111)
Chloride: 91 mmol/L — ABNORMAL LOW (ref 98–111)
Creatinine, Ser: 3.88 mg/dL — ABNORMAL HIGH (ref 0.61–1.24)
Creatinine, Ser: 2.77 mg/dL — ABNORMAL HIGH (ref 0.61–1.24)
GFR calc Af Amer: 16 mL/min — ABNORMAL LOW (ref >60)
GFR calc Af Amer: 24 mL/min — ABNORMAL LOW (ref >60)
GFR calc non Af Amer: 14 mL/min — ABNORMAL LOW (ref >60)
GFR calc non Af Amer: 21 mL/min — ABNORMAL LOW (ref >60)
Glucose, Bld: 280 mg/dL — ABNORMAL HIGH (ref 70–99)
Glucose, Bld: 224 mg/dL — ABNORMAL HIGH (ref 70–99)
Phosphorus: 7.8 mg/dL — ABNORMAL HIGH (ref 2.5–4.6)
Phosphorus: 4.3 mg/dL (ref 2.5–4.6)
Potassium: 3.8 mmol/L (ref 3.5–5.1)
Potassium: 4 mmol/L (ref 3.5–5.1)
Sodium: 145 mmol/L (ref 135–145)
Sodium: 142 mmol/L (ref 135–145)

## 2019-03-11 LAB — POCT I-STAT, CHEM 8
BUN: 86 mg/dL — ABNORMAL HIGH (ref 8–23)
Calcium, Ion: 1.24 mmol/L (ref 1.15–1.40)
Chloride: 103 mmol/L (ref 98–111)
Creatinine, Ser: 4.5 mg/dL — ABNORMAL HIGH (ref 0.61–1.24)
Glucose, Bld: 151 mg/dL — ABNORMAL HIGH (ref 70–99)
HCT: 21 % — ABNORMAL LOW (ref 39.0–52.0)
Hemoglobin: 7.1 g/dL — ABNORMAL LOW (ref 13.0–17.0)
Potassium: 3.9 mmol/L (ref 3.5–5.1)
Sodium: 141 mmol/L (ref 135–145)
TCO2: 19 mmol/L — ABNORMAL LOW (ref 22–32)

## 2019-03-11 LAB — BPAM PLATELET PHERESIS
Blood Product Expiration Date: 202101142359
Blood Product Expiration Date: 202101142311
ISSUE DATE / TIME: 202101131932
ISSUE DATE / TIME: 202101132314
Unit Type and Rh: 6200
Unit Type and Rh: 6200

## 2019-03-11 LAB — PREPARE RBC (CROSSMATCH)

## 2019-03-11 LAB — BLOOD CULTURE ID PANEL (REFLEXED)

## 2019-03-11 LAB — PROTIME-INR
INR: 2.2 — ABNORMAL HIGH (ref 0.8–1.2)
INR: 1.9 — ABNORMAL HIGH (ref 0.8–1.2)
Prothrombin Time: 24.4 seconds — ABNORMAL HIGH (ref 11.4–15.2)
Prothrombin Time: 21.4 seconds — ABNORMAL HIGH (ref 11.4–15.2)

## 2019-03-11 LAB — AST: AST: 8966 U/L — ABNORMAL HIGH (ref 15–41)

## 2019-03-11 LAB — LACTIC ACID, PLASMA
Lactic Acid, Venous: >11 mmol/L (ref 0.5–1.9)
Lactic Acid, Venous: >11 mmol/L (ref 0.5–1.9)
Lactic Acid, Venous: >11 mmol/L (ref 0.5–1.9)

## 2019-03-11 LAB — APTT: aPTT: 58 seconds — ABNORMAL HIGH (ref 24–36)

## 2019-03-11 LAB — PREPARE CRYOPRECIPITATE: Unit division: 0

## 2019-03-11 LAB — PROTEIN, TOTAL: Total Protein: 4.2 g/dL — ABNORMAL LOW (ref 6.5–8.1)

## 2019-03-11 LAB — DIC (DISSEMINATED INTRAVASCULAR COAGULATION)PANEL
D-Dimer, Quant: 8.86 ug/mL-FEU — ABNORMAL HIGH (ref 0.00–0.50)
Fibrinogen: 210 mg/dL (ref 210–475)
INR: 2.4 — ABNORMAL HIGH (ref 0.8–1.2)
Platelets: 97 10*3/uL — ABNORMAL LOW (ref 150–400)
Prothrombin Time: 26.1 seconds — ABNORMAL HIGH (ref 11.4–15.2)
Smear Review: NONE SEEN
aPTT: 57 seconds — ABNORMAL HIGH (ref 24–36)

## 2019-03-11 LAB — PREPARE PLATELET PHERESIS
Unit division: 0
Unit division: 0

## 2019-03-11 LAB — BPAM CRYOPRECIPITATE
Blood Product Expiration Date: 202101140140
ISSUE DATE / TIME: 202101132031
Unit Type and Rh: 6200

## 2019-03-11 LAB — PATHOLOGIST SMEAR REVIEW

## 2019-03-11 LAB — MAGNESIUM: Magnesium: 2.1 mg/dL (ref 1.7–2.4)

## 2019-03-11 LAB — ALKALINE PHOSPHATASE: Alkaline Phosphatase: 285 U/L — ABNORMAL HIGH (ref 38–126)

## 2019-03-11 LAB — BILIRUBIN, TOTAL: Total Bilirubin: 8.5 mg/dL — ABNORMAL HIGH (ref 0.3–1.2)

## 2019-03-11 MED ORDER — SODIUM CHLORIDE 0.9% IV SOLUTION: Freq: Once | INTRAVENOUS | Status: DC

## 2019-03-11 MED ORDER — SODIUM BICARBONATE 8.4 % IV SOLN
100.0000 meq | Freq: Once | INTRAVENOUS | Status: AC
  Administered 2019-03-11: 100 meq via INTRAVENOUS

## 2019-03-11 MED ORDER — RACEPINEPHRINE HCL 2.25 % IN NEBU
INHALATION_SOLUTION | RESPIRATORY_TRACT | Status: AC
  Filled 2019-03-11: qty 0.5

## 2019-03-11 MED ORDER — POTASSIUM CHLORIDE 10 MEQ/100ML IV SOLN
10.0000 meq | INTRAVENOUS | Status: AC
  Administered 2019-03-11 (×3): 10 meq via INTRAVENOUS
  Filled 2019-03-11 (×3): qty 100

## 2019-03-11 MED ORDER — SODIUM CHLORIDE 0.9% IV SOLUTION: Freq: Once | INTRAVENOUS | Status: AC

## 2019-03-11 MED ORDER — CALCIUM GLUCONATE-NACL 1-0.675 GM/50ML-% IV SOLN
1.0000 g | Freq: Once | INTRAVENOUS | Status: DC
  Filled 2019-03-11: qty 50

## 2019-03-11 MED ORDER — LEVOTHYROXINE SODIUM 100 MCG/5ML IV SOLN
25.0000 ug | Freq: Every day | INTRAVENOUS | Status: DC
  Administered 2019-03-12: 25 ug via INTRAVENOUS
  Filled 2019-03-11 (×2): qty 5

## 2019-03-11 MED ORDER — CALCIUM CHLORIDE 10 % IV SOLN
INTRAVENOUS | Status: AC
  Filled 2019-03-11: qty 10

## 2019-03-11 MED ORDER — STERILE WATER FOR INJECTION IV SOLN
INTRAVENOUS | Status: DC
  Filled 2019-03-11 (×9): qty 150

## 2019-03-11 MED ORDER — ALBUMIN HUMAN 5 % IV SOLN
INTRAVENOUS | Status: AC
  Filled 2019-03-11: qty 250

## 2019-03-11 MED ORDER — SODIUM BICARBONATE 8.4 % IV SOLN: 50.0000 meq | Freq: Once | INTRAVENOUS | Status: AC

## 2019-03-11 MED ORDER — SODIUM BICARBONATE-DEXTROSE 150-5 MEQ/L-% IV SOLN
150.0000 meq | INTRAVENOUS | Status: DC
  Administered 2019-03-12 – 2019-03-13 (×3): 150 meq via INTRAVENOUS
  Filled 2019-03-11 (×3): qty 1000

## 2019-03-11 MED ORDER — VANCOMYCIN HCL 750 MG/150ML IV SOLN
750.0000 mg | INTRAVENOUS | Status: DC
  Administered 2019-03-12: 750 mg via INTRAVENOUS
  Filled 2019-03-11: qty 150

## 2019-03-11 MED ORDER — CALCIUM GLUCONATE-NACL 1-0.675 GM/50ML-% IV SOLN
1.0000 g | Freq: Once | INTRAVENOUS | Status: AC
  Administered 2019-03-11: 10:00:00 1000 mg via INTRAVENOUS
  Filled 2019-03-11: qty 50

## 2019-03-11 MED ORDER — SODIUM BICARBONATE 8.4 % IV SOLN
INTRAVENOUS | Status: AC
  Filled 2019-03-11: qty 100

## 2019-03-11 MED ORDER — INSULIN REGULAR(HUMAN) IN NACL 100-0.9 UT/100ML-% IV SOLN
INTRAVENOUS | Status: DC
  Administered 2019-03-11: 6.5 [IU]/h via INTRAVENOUS
  Filled 2019-03-11: qty 100

## 2019-03-11 MED ORDER — PANTOPRAZOLE SODIUM 40 MG IV SOLR
40.0000 mg | INTRAVENOUS | Status: DC
  Administered 2019-03-11: 40 mg via INTRAVENOUS
  Filled 2019-03-11: qty 40

## 2019-03-11 MED ORDER — SODIUM CHLORIDE 0.9 % IV SOLN
2.0000 g | Freq: Two times a day (BID) | INTRAVENOUS | Status: DC
  Administered 2019-03-11 – 2019-03-12 (×2): 2 g via INTRAVENOUS
  Filled 2019-03-11 (×3): qty 2

## 2019-03-11 MED ORDER — CALCIUM CHLORIDE 10 % IV SOLN
1.0000 g | Freq: Once | INTRAVENOUS | Status: AC
  Administered 2019-03-11: 06:00:00 1 g via INTRAVENOUS

## 2019-03-11 MED ORDER — STERILE WATER FOR INJECTION IV SOLN
INTRAVENOUS | Status: DC
  Filled 2019-03-11 (×12): qty 150

## 2019-03-11 MED ORDER — CALCIUM GLUCONATE-NACL 2-0.675 GM/100ML-% IV SOLN
2.0000 g | Freq: Once | INTRAVENOUS | Status: AC
  Administered 2019-03-11: 2000 mg via INTRAVENOUS
  Filled 2019-03-11: qty 100

## 2019-03-11 MED ORDER — DEXTROSE 50 % IV SOLN
0.0000 mL | INTRAVENOUS | Status: DC | PRN
  Administered 2019-03-12: 50 mL via INTRAVENOUS
  Administered 2019-03-12 (×2): 25 mL via INTRAVENOUS
  Filled 2019-03-11 (×2): qty 50

## 2019-03-11 MED ORDER — SODIUM BICARBONATE 8.4 % IV SOLN
INTRAVENOUS | Status: AC
  Administered 2019-03-11: 08:00:00 50 meq via INTRAVENOUS
  Filled 2019-03-11: qty 100

## 2019-03-11 NOTE — Progress Notes (Signed)
Dr Roxan Hockey was paged and updated about the patient's condition. Verbal orders were to give 4 FFP's, 2 PRBC's, 1 platelet, check a DIC panel, and give 3 additional amps of Bicarb. Dr. Donne Hazel was also paged and updated. Will continue to monitor for any changes.

## 2019-03-11 NOTE — Progress Notes (Signed)
[facility] NEPHROLOGY PROGRESS NOTE  Assessment/ Plan: Pt is a 84 y.o. yo male with HTN, CAD, stroke, DM, admitted with an NSTEMI and found to have three-vessel CAD status post CABG on 12/30.  Dialysis: [facility] 4h   400/800    73.5kg    3/2.25Ca bath  P4  Hep none L AVF Hectorol 4 Venofer 50 Mircera 75 (last 12/15)  Problems: # Sepsis/ shock - due to infarcted bowel, sp subtotal colectomy 1/13 yesterday. On multiple pressors, IV abx and IV hydrocortisone   # Severe metabolic acidosis - changing pre/post fluids to bicarb 125/h  #CAD/NSTEMI: LHC with severe multivessel CAD status post CABG.  Cardiology, CT surgery following.  # ESRD, HD per TTS schedule normally, changed to CRRT 1/10- 1/11 for hypotension, stopped temporarily then resumed 1/13 for septic shock.    - unable to pull fluids due to severe shock  - cont CRRT w/ bicarb pre and post  # Acute hypoxic resp failure - on vent, per primary  # Anemia due to CKD: Received PRBC postop.  Aranesp weekly on Tuesdays - note was started 1/5 here   # Secondary hyperparathyroidism: Started hectorol.  Discontinue binders for now   #Paroxysmal A. fib   Robert [name] 03/08/2019,10:08 AM   Subjective:  Went to OR yest for infarcted colon and underwent subtotal colectomy, wound vac / ex lap. Has persistent bleeding from abd wounds and is undergoing coagulopathy (^PT/ PTT) correction today. No new CRRT issues.   Objective Vital signs in last 24 hours: Vitals:   03/11/19 0945 03/11/19 0954 03/11/19 0957 03/11/19 1000  BP: (!) 119/48   (!) 116/49  Pulse: 65 64 66 68  Resp: (!) 22 (!) 22 (!) 22 (!) 22  Temp: (!) 95.5 F (35.3 C) (!) 95.5 F (35.3 C) (!) 95.7 F (35.4 C) (!) 95.5 F (35.3 C)  TempSrc:      SpO2: 93% 93% 92% 91%  Weight:      Height:       Physical Exam:   General: on vent, sedated, multiple drips Heart:s1s2 irregular Chest: clear R, dec'd L base Abdomen:soft, Non-tender,  non-distended Extremities: 1-2+ diffuse edema Neuro sedated on the vent Dialysis Access: L AVF, L groin temp HD cath  Medications: Infusions: . sodium chloride 20 mL/hr at 03/20/2019 1730  . albumin human    . amiodarone 30 mg/hr (03/11/19 1000)  . anidulafungin    . calcium gluconate 1,000 mg (03/11/19 1004)  . ceFEPime (MAXIPIME) IV Stopped (03/11/19 0553)  . dexmedetomidine (PRECEDEX) IV infusion 0.5 mcg/kg/hr (03/07/2019 1541)  . epinephrine 11 mcg/min (03/11/19 0600)  . epinephrine 11 mcg/min (03/11/19 1000)  . fentaNYL infusion INTRAVENOUS 150 mcg/hr (03/11/19 1000)  . metronidazole Stopped (03/11/19 0717)  . norepinephrine (LEVOPHED) Adult infusion 50 mcg/min (03/11/19 1000)  . phenylephrine (NEO-SYNEPHRINE) Adult infusion 390 mcg/min (03/11/19 1000)  . prismasol BGK 4/2.5 1,700 mL/hr at 03/06/2019 1906  . sodium bicarbonate 150 mEq in dextrose 5% 1000 mL 250 mL/hr at 03/11/19 1000  . sodium bicarbonate (isotonic) 1000 mL infusion 75 mL/hr at 03/11/19 0849  . sodium bicarbonate (isotonic) 1000 mL infusion 75 mL/hr at 03/11/19 0849  . vancomycin Stopped (03/11/19 0717)  . vasopressin (PITRESSIN) infusion - *FOR SHOCK* 0.04 Units/min (03/11/19 1000)    Scheduled Medications: . aspirin EC  325 mg Oral Daily   Or  . aspirin  324 mg Per Tube Daily  . B-complex with vitamin C  1 tablet Per Tube Daily  .  calcium chloride      . chlorhexidine gluconate (MEDLINE KIT)  15 mL Mouth Rinse BID  . Chlorhexidine Gluconate Cloth  6 each Topical Daily  . darbepoetin (ARANESP) injection - DIALYSIS  60 mcg Subcutaneous Q Tue  . doxercalciferol  4 mcg Intravenous Once per day on Tue Thu Sat  . feeding supplement (PRO-STAT SUGAR FREE 64)  30 mL Per Tube BID  . hydrocortisone sod succinate (SOLU-CORTEF) inj  50 mg Intravenous Q6H  . insulin aspart  0-24 Units Subcutaneous Q4H  . [START ON 03/16/2019] levothyroxine  25 mcg Intravenous Daily  . mouth rinse  15 mL Mouth Rinse 10 times per day  .  midodrine  10 mg Per Tube TID WC  . pantoprazole (PROTONIX) IV  40 mg Intravenous Q24H  . Racepinephrine HCl      . rosuvastatin  40 mg Per Tube q1800  . sodium bicarbonate      . sodium bicarbonate      . sodium chloride flush  10-40 mL Intracatheter Q12H  . sodium chloride flush  3 mL Intravenous Q12H    have reviewed scheduled and prn medications.

## 2019-03-11 NOTE — Progress Notes (Signed)
[facility] Progress Note Patient Name: Clell Trahan. DOB: Aug 03, 1935 MRN: [id number]   Date of Service  03/11/2019  HPI/Events of Note  RN had called the surgical attending and updated him on bleeding, as well as vitals Additional blood products and additional bicarb was ordered by surgical team Patient was given platelets, as well as is now getting 2 units RBC and 2 more units FFP  eICU Interventions  DIC panel order requested, placed Lactate continues to be severely raised Continue replacement of blood products per surg team I ordered repeat lactate for 5 am Please call E link if needed     Intervention Category Major Interventions: Hemorrhage - evaluation and management  Margaretmary Lombard 03/11/2019, 12:18 AM

## 2019-03-11 NOTE — Consult Note (Signed)
PULMONARY / CRITICAL CARE MEDICINE   NAME:  Robert [name]., MRN:  [id number], DOB:  1936/02/07, LOS: 48 ADMISSION DATE:  02/16/2019, CONSULTATION DATE:  03/09/2019 REFERRING MD: Robert [name] ,MD CHIEF COMPLAINT:  Acute hypoxic respiratory failure BRIEF HISTORY:     Robert [name]. is a 84 y.o. male with medical history significant of ESRD on TTS dialysis, HTN, DM2, stroke s/p CEA, CAD s/p 3 stents who presented to ED for evaluation of tachycardia on 12/28.  [name] subsequently found to be in A. fib with RVR and have elevated troponin. He is now s/p CABG and clipping of atrial appendage on 12/30. PCCM consulted to evaluate new oxygen and pressor requirement  after aspiration event.  HISTORY OF PRESENT ILLNESS   Per chart: Robert [name] is followed by Robert [name]. Robert [name] has CAD s/p DES x 3, last PCI stent in 2011 to [place] (per chart review, no cath note in epic). He had a stroke in 2018 and had a loop recorder implanted. Echo in 2018 showed EF 55-60%, G1DD, mild LVH, severe MAC, trace MR and TR.  His last Loop recorder check was 10/19/18 which did not show any arrhythmias. He had a televisit with Dr. Rayann [name] 11/04/18 who explained loop recorder at RRT and [name] preferred to leave it in and would call for removal in Robert future.   Robert [name] presented to Robert ED 02/08/2019 for generalized weakness and tachycardia. Robert [name] was in his normal state of health that morning. He woke up and went to Robert ophthalmologist for a check-up and went home. At around 2 PM he started feeling generally bad and lightheadedness. He felt weak all over and shortness of breath. No chest pain. No fever, cough, or recent illness. No lower leg edema or orthopnea. He called EMS and they reported Afib RVR rate 180. He was given cardizem IV and HR improved to 140 bpm.   In Robert ED BP was 119/104, afebrile, RR 18, 98% O2. Labs showed potassium 3.8, glucose 233, calcium 9.5. WBC 5.5, Hgb 12.2. EKG showed Afib RVR, 159  bpm with likey rate related changes. CXR significant for cardiomegaly with no acute changes. HS troponin 81 and Robert second was 856. He was given 5 mg of metoprolol and converted to NSR. Follow-up EKG showed NST 70 bpm with PAC, and minimal ST depression V5 and II.  Robert [name] was admitted for further observation.      SIGNIFICANT PAST MEDICAL HISTORY   Allergy - year round Cancer [facility])     Comment:  PSA ELEVATED ESRD (end stage renal disease) ([place])     Comment:  [place] GERD (gastroesophageal reflux disease) Hearing deficit  History of kidney stones Hyperlipidemia Hypertension Hypothyroidism 2011: Myocardial infarction ([place]) No date: Stroke (cerebrum) ([place])     Comment:  weakness in left hand  04/2015, 05/20/16 No date: Thyroid disease     Comment:  hypothyroid  SIGNIFICANT EVENTS:  12/30 3 Vessel CABG - SVC to Diag1; SVC to OM1; SVC to PDA and LIMA to LAD. (N/A) Clipping Of Atrial Appendage using 45 MM AtriCure AtriClip (N/A)  1/13 - Emergent surgery for ischemic colon   STUDIES:    CULTURES:  12/28 Sars Coronavirus 2 : Negative 12/30 MRSA nasopharyngeal : Negative 1/13 Blood cultures: Pending results 1/13 Sputum cultures: Gram positive rods on gram stain  ANTIBIOTICS:  Cefepime 1/12  LINES/TUBES:  1/10 Chest tube 1/13 Thoracentesis   CONSULTANTS:  Nephrology CTS Gen Surg SUBJECTIVE:  Intubated.  CONSTITUTIONAL: BP (!) 104/46   Pulse 63   Temp (!) 96.8 F (36 C)   Resp (!) 22   Ht _0  (1.727 m)   Wt 74.6 kg Comment: Informed Dr. Jonnie Finner  SpO2 96%   BMI 25.01 kg/m   I/O last 3 completed shifts: In: 79390 [I.V.:9641.8; ZESPQ:3300; NG/GT:180; IV Piggyback:2150.2] Out: 2175 [Emesis/NG output:850; Drains:1400; Blood:100]  CVP:  [8 mmHg-57 mmHg] 57 mmHg  Vent Mode: PRVC FiO2 (%):  [80 %-100 %] 100 % Set Rate:  [18 bmp-22 bmp] 22 bmp Vt Set:  [650 mL] 650 mL PEEP:  [12 cmH20] 12 cmH20 Plateau Pressure:  [26 cmH20-30 cmH20] 30  cmH20  PHYSICAL EXAM: General:  NAD, appears ill  Neuro: intubated an sedated  HEENT:  PEERL, sclera antiicteric Cardiovascular:  RRR, no mrg Lungs: CTAB, on vent Abdomen:  Wound vac in place, bleeding  Musculoskeletal:  No edema or deformity Skin:  Warm and dry, no rashes  RESOLVED PROBLEM LIST   ASSESSMENT AND PLAN   84 year old POD 15 from CABG , now with ischemic bowel and acute respiratory failure 2/2 to pneumonia.   Acute respiratory failure after aspiration Gram positive rods on sputum culture. On vent. FIO2 100%. P: - Continue broad spectrum antifungal and antibiotics in setting of sever illness  Shock -  Initially febrile with leukocytosis. on TTM.  - pending blood cultures - DIC panel sent, fibrinogen low in of normal in setting of inflammatory stare, PT PTT D-dimer elevate,no schistocytes on smear. Likely due to blood loss and sepsis P: Continue Levo, vasopressin , neo  Ischemic Bowel Bleeding from wound vac - Bleeding from wound vac, multiple PRBC and FFP overnight P: -Per surgery  ESRD Per nephrology    SUMMARY OF TODAY'S PLAN:  Plan to further evaluate cause of sepsis with lab work. Broaden antibiotics.   Best Practice / Goals of Care / Disposition.   DVT PROPHYLAXIS: Subq Heparin  SUP: Protonix NUTRITION: tube feeds  MOBILITY: up with assistance GOALS OF CARE: wean vent as tolerated  FAMILY DISCUSSIONS: per primary  DISPOSITION: ICU  LABS  Glucose Recent Labs  Lab 03/21/2019 1401 03/05/2019 1610 03/11/2019 2022 03/11/19 0152 03/11/19 0500 03/11/19 0503  GLUCAP 64* 158* 190* 253* 274* 212*    BMET Recent Labs  Lab 03/15/2019 0410 03/03/2019 0432 03/27/2019 1632 03/17/2019 2151 03/11/19 0228 03/11/19 0500 03/11/19 0509  NA 138   < > 144   < > 145 142 142  K 4.1   < > 3.7   < > 3.8 4.1 4.0  CL 99  --  101  --  98  --   --   CO2 21*  --  14*  --  15*  --   --   BUN 82*  --  75*  --  50*  --   --   CREATININE 5.29*  --  5.11*  --  3.88*  --    --   GLUCOSE 140*  --  197*  --  280*  --   --    < > = values in this interval not displayed.    Liver Enzymes Recent Labs  Lab 03/05/19 1300 03/06/19 1010 03/27/2019 0410 03/28/2019 1632 03/11/19 0228  AST 57*  --   --   --   --   ALT 47*  --   --   --   --   ALKPHOS 75  --   --   --   --  BILITOT 0.2*  --   --   --   --   ALBUMIN 2.0*   < > 2.0* 1.7* 1.9*   < > = values in this interval not displayed.    Electrolytes Recent Labs  Lab 03/09/19 0426 03/09/19 1549 03/20/2019 0410 03/25/2019 1632 03/11/19 0228  CALCIUM 8.1*   < > 8.0* 8.4* 6.4*  MG 2.3  --  2.4  --  2.1  PHOS 2.5   < > 5.2* 8.1* 7.8*   < > = values in this interval not displayed.    CBC Recent Labs  Lab 03/09/2019 1838 03/20/2019 1838 03/25/2019 2133 03/14/2019 2151 03/11/19 0228 03/11/19 0500 03/11/19 0509  WBC 9.1  --  15.1*  --  14.9*  --   --   HGB 7.3*   < > 8.0*   < > 8.4* 7.5* 7.5*  HCT 25.7*   < > 27.4*   < > 26.8* 22.0* 22.0*  PLT 62*  --  93*  --  98*  97*  --   --    < > = values in this interval not displayed.    ABG Recent Labs  Lab 03/16/2019 2326 03/11/19 0500 03/11/19 0509  PHART 7.242* 7.188* 7.171*  PCO2ART 50.2* 41.7 41.3  PO2ART 64.0* 97.0 92.0    Coag's Recent Labs  Lab 03/15/2019 1401 03/06/2019 2133 03/11/19 0228  APTT 63*  --  57*  58*  INR 3.1* 3.1* 2.4*    Sepsis Markers Recent Labs  Lab 03/09/2019 1336 03/28/2019 2319 03/11/19 0228  LATICACIDVEN >11.0* >11.0* >11.0*    Cardiac Enzymes No results for input(s): TROPONINI, PROBNP in Robert last 168 hours.  PAST MEDICAL HISTORY :   He  has a past medical history of Allergy, Anemia, Cancer ([place]), Carotid artery occlusion, Cataract, Depression, Diabetes mellitus, Dialysis [name] ([place]), Dyspnea, ESRD (end stage renal disease) ([place]), GERD (gastroesophageal reflux disease), Hearing deficit, History of kidney stones, Hyperlipidemia, Hypertension, Hypothyroidism, Myocardial infarction ([place]) (2011), Stroke (cerebrum) ([place]),  and Thyroid disease.  PAST SURGICAL HISTORY:  He  has a past surgical history that includes stents; Coronary angioplasty with stent (07/2009); TEE without cardioversion (N/A, 05/16/2015); Cardiac catheterization (N/A, 05/16/2015); Elbow surgery; Nose surgery; Esophagoscopy with dilitation; Endarterectomy (Right, 06/06/2016); AV fistula placement (Right, 07/25/2016); Colonoscopy; Fistula superficialization (Right, 12/05/2016); A/V Fistulagram (Right, 06/24/2017); PERIPHERAL VASCULAR BALLOON ANGIOPLASTY (06/24/2017); Bascilic vein transposition (Left, 10/15/2017); Bascilic vein transposition (Left, 12/10/2017); LEFT HEART CATH AND CORONARY ANGIOGRAPHY (N/A, 02/01/2019); Coronary artery bypass graft (N/A, 02/05/2019); Clipping of atrial appendage (N/A, 02/14/2019); and IR THORACENTESIS ASP PLEURAL SPACE W/IMG GUIDE (03/09/2019).  Allergies  Allergen Reactions  . Lisinopril Other (See Comments)    "made me lose my voice"    No current facility-administered medications on file prior to encounter.   Current Outpatient Medications on File Prior to Encounter  Medication Sig  . aspirin 81 MG tablet Take 1 tablet (81 mg total) by mouth daily.  . calcium acetate (PHOSLO) 667 MG tablet Take 1,334 mg by mouth See admin instructions. Take 2 tablets (1334 mg) by mouth daily with any meals or snacks.  . clopidogrel (PLAVIX) 75 MG tablet Take 1 tablet (75 mg total) by mouth daily.  [name] ezetimibe (ZETIA) 10 MG tablet Take 1 tablet (10 mg total) by mouth daily.  . fexofenadine (ALLEGRA) 180 MG tablet Take 180 mg by mouth daily.  . folic acid-vitamin b complex-vitamin c-selenium-zinc (DIALYVITE) 3 MG TABS tablet Take 1 tablet by mouth daily.  . hydrALAZINE (APRESOLINE) 50  MG tablet Take 50 mg by mouth See admin instructions. Sunday, Monday, Wednesday & Friday [name] takes 1 tablet (50 mg) by mouth twice daily. Tuesday, Thursday, & Saturdays take 1 tablet at bedtime  . HYDROcodone-acetaminophen (NORCO/VICODIN) 5-325 MG  tablet Take 1 tablet by mouth every 4 (four) hours as needed. ([name] taking differently: Take 1 tablet by mouth every 4 (four) hours as needed for moderate pain. )  . insulin degludec (TRESIBA FLEXTOUCH) 100 UNIT/ML SOPN FlexTouch Pen Inject 15 Units into Robert skin at bedtime.   [name] levothyroxine (SYNTHROID, LEVOTHROID) 50 MCG tablet Take 50 mcg by mouth daily before breakfast.   . lidocaine (LIDODERM) 5 % Place 1 patch onto Robert skin daily. Remove & Discard patch within 12 hours or as directed by MD  . metoprolol succinate (TOPROL-XL) 50 MG 24 hr tablet TAKE 1 TABLET BY MOUTH IN  Robert EVENING TUESDAY,  THURSDAY, SATURDAY AND 1  TABLET TWICE DAILY ALL  OTHER DAYS ([name] taking differently: Take 50 mg by mouth See admin instructions. TAKE 1 TABLET (50 MG TOTALLY)BY MOUTH IN  Robert EVENING TUESDAY,  THURSDAY, SATURDAY AND 1  TABLET (50 MG TOTALLY) TWICE DAILY ALL  OTHER DAYS)  . Multiple Vitamins-Minerals (PRESERVISION AREDS PO) Take 1 tablet by mouth daily.  . pantoprazole (PROTONIX) 40 MG tablet Take 40 mg by mouth every evening.   . sertraline (ZOLOFT) 25 MG tablet Take 25 mg by mouth daily.   . simvastatin (ZOCOR) 40 MG tablet Take 1 tablet (40 mg total) by mouth daily at 6 PM.  . ACCU-CHEK AVIVA PLUS test strip     FAMILY HISTORY:   His family history includes Dementia in his maternal grandfather; Diabetes in his mother. There is no history of Heart attack.  SOCIAL HISTORY:  He  reports that he has quit smoking. His smoking use included cigarettes. He has never used smokeless tobacco. He reports current alcohol use of about 2.0 standard drinks of alcohol per week. He reports that he does not use drugs.  REVIEW OF SYSTEMS:    Unable to obtain while [name] intubated

## 2019-03-11 NOTE — Progress Notes (Signed)
Patient ID: Robert [name]., male   DOB: [birth year], 84 y.o.   MRN: [id number] TCTS Evening Rounds:  Remains critically ill on multiple pressors with CI 3.5, SVR 485.  Able to wean down pressors a little today. NE 45, Vaso 0.04, epi 5. Stable on vent CRRT ongoing.  CBC    Component Value Date/Time   WBC 16.4 (H) 03/11/2019 1200   RBC 2.71 (L) 03/11/2019 1200   HGB 7.5 (L) 03/11/2019 1202   HCT 22.0 (L) 03/11/2019 1202   PLT 56 (L) 03/11/2019 1200   MCV 92.6 03/11/2019 1200   MCH 29.2 03/11/2019 1200   MCHC 31.5 03/11/2019 1200   RDW 18.1 (H) 03/11/2019 1200   LYMPHSABS 1.0 02/06/2019 0545   MONOABS 0.7 01/28/2019 0545   EOSABS 0.1 02/25/2019 0545   BASOSABS 0.1 02/01/2019 0545   BMET    Component Value Date/Time   NA 142 03/11/2019 1555   NA 140 10/05/2018 1340   K 4.0 03/11/2019 1555   CL 91 (L) 03/11/2019 1555   CO2 23 03/11/2019 1555   GLUCOSE 224 (H) 03/11/2019 1555   BUN 32 (H) 03/11/2019 1555   BUN 48 (H) 10/05/2018 1340   CREATININE 2.77 (H) 03/11/2019 1555   CALCIUM 6.8 (L) 03/11/2019 1555   GFRNONAA 21 (L) 03/11/2019 1555   GFRAA 24 (L) 03/11/2019 1555   Calcium replaced today. Received 5 G.

## 2019-03-11 NOTE — Progress Notes (Signed)
During abdominal assessment, pocket of blood noted at left lower aspect of wound vac. Approximately 4cmx2cm. Strong seal/no leak noted. Dr.'s Mannam and Ninfa Linden notified. No new orders at this time, will continue to monitor.

## 2019-03-11 NOTE — Progress Notes (Signed)
PT Cancellation Note  Patient Details Name: Robert [name]. MRN: [id number] DOB: 1935-04-21   Cancelled Treatment:    Reason Eval/Treat Not Completed: Medical issues which prohibited therapy. Pt remains intubated and sedated with continued bleeding from wound vac s/p recent subtotal colectomy. Pt remains medically inappropriate for PT intervention at this time. Acute PT will sign off at this time, please re-consult when the patient is medically appropriate to participate in skilled mobility assessment and intervention.   Zenaida Niece 03/11/2019, 7:58 AM

## 2019-03-11 NOTE — Progress Notes (Signed)
Speech Language Pathology Discharge Patient Details Name: Robert [name]. MRN: [id number] DOB: 13-Aug-1935 Today's Date: 03/11/2019 Time:  -     Patient discharged from SLP services secondary to medical decline - will need to re-order SLP to resume therapy services. Pt intubated   Please see latest therapy progress note for current level of functioning and progress toward goals.    Progress and discharge plan discussed with patient and/or caregiver: Patient unable to participate in discharge planning and no caregivers available  GO     Houston Siren 03/11/2019, 8:10 AM   Orbie Pyo Colvin Caroli.Ed Risk analyst [phone] Office [phone]

## 2019-03-11 NOTE — Progress Notes (Signed)
PHARMACY - PHYSICIAN COMMUNICATION CRITICAL VALUE ALERT - BLOOD CULTURE IDENTIFICATION (BCID)  Robert [name]. is an 84 y.o. male who presented to [facility] on 02/04/2019   Assessment:  Blood cultures showing 1 of 2 sets growing GPC, no identification yet  Name of physician (or Provider) Contacted: Maryan Rued PharmD, rounding with Dr. Wonda Amis  Current antibiotics: Vancomycin, cefepime, metronidazole, anidulafungin  Changes to prescribed antibiotics recommended: None  No results found for this or any previous visit.  [name] 03/11/2019  9:53 AM

## 2019-03-11 NOTE — Progress Notes (Signed)
Patient's Hgb 7.3 and platelets are 44. Pt's output from the wound vac was 150 within the first hour. Dr. Cyndia Bent was paged. Verbal orders were to transfuse 1 unit of PRBC and 1 platelet. Will continue to monitor.

## 2019-03-11 NOTE — Progress Notes (Signed)
[facility] Progress Note Patient Name: Robert [name]. DOB: Jan 21, 1936 MRN: [id number]   Date of Service  03/11/2019  HPI/Events of Note  Called to check in on patient. RN had already been in touch with surgical team and additional blood products were ordered. IV bicarb and calcium were also ordered by surgical team  eICU Interventions  ABG and lactate to be repeated in an hour Discussed with RN, please call if needed     Intervention Category Major Interventions: Hemorrhage - evaluation and management  Margaretmary Lombard 03/11/2019, 6:10 AM

## 2019-03-11 NOTE — Progress Notes (Addendum)
[facility] Progress Note  1 Day Post-Op  Subjective: CC-  Patient remains critically ill, on multiple pressors. BP soft. Lactic acid >11. INR 2.4, PTT 57, platelets 97. Getting blood, FFP. Wound vac with about 800cc blood out over night.  Objective: Vital signs in last 24 hours: Temp:  [93.7 F (34.3 C)-99.5 F (37.5 C)] 95.5 F (35.3 C) (01/14 0830) Pulse Rate:  [34-123] 65 (01/14 0830) Resp:  [18-33] 22 (01/14 0830) BP: (86-145)/(28-83) 108/49 (01/14 0830) SpO2:  [80 %-100 %] 95 % (01/14 0830) FiO2 (%):  [80 %-100 %] 100 % (01/14 0806) Last BM Date: 03/09/19  Intake/Output from previous day: 01/13 0701 - 01/14 0700 In: 14125.5 [I.V.:9299.3; NFAOZ:3086; NG/GT:180; IV Piggyback:1650.2] Out: 1725 [Emesis/NG output:400; Drains:1400; Blood:100] Intake/Output this shift: Total I/O In: 580.1 [I.V.:507.9; IV Piggyback:72.1] Out: 1 [Other:1]  PE: Gen:  On the vent, sedated Card:  RRR, feet warm but difficult to palpate DP pulses Pulm:  CTAB, mechanically ventilated Abd: Soft, hypoactive BS, vac in place with blood noted at distal aspect of sponge/ 800cc bloody drainage out from wound vac over night GU: scrotum mildly edematous and ecchymotic  Vent settings: Vent Mode: PRVC FiO2 (%):  [80 %-100 %] 100 % Set Rate:  [18 bmp-22 bmp] 22 bmp Vt Set:  [650 mL] 650 mL PEEP:  [12 cmH20] 12 cmH20 Plateau Pressure:  [26 cmH20-30 cmH20] 29 cmH20   Lab Results:  Recent Labs    03/06/2019 2133 03/16/2019 2151 03/11/19 0228 03/11/19 0228 03/11/19 0500 03/11/19 0509  WBC 15.1*  --  14.9*  --   --   --   HGB 8.0*   < > 8.4*   < > 7.5* 7.5*  HCT 27.4*   < > 26.8*   < > 22.0* 22.0*  PLT 93*  --  98*  97*  --   --   --    < > = values in this interval not displayed.   BMET Recent Labs    03/21/2019 1632 03/17/2019 2151 03/11/19 0228 03/11/19 0228 03/11/19 0500 03/11/19 0509  NA 144   < > 145   < > 142 142  K 3.7   < > 3.8   < > 4.1 4.0  CL 101  --  98  --   --    --   CO2 14*  --  15*  --   --   --   GLUCOSE 197*  --  280*  --   --   --   BUN 75*  --  50*  --   --   --   CREATININE 5.11*  --  3.88*  --   --   --   CALCIUM 8.4*  --  6.4*  --   --   --    < > = values in this interval not displayed.   PT/INR Recent Labs    03/24/2019 2133 03/11/19 0228  LABPROT 31.5* 26.1*  INR 3.1* 2.4*   CMP     Component Value Date/Time   NA 142 03/11/2019 0509   NA 140 10/05/2018 1340   K 4.0 03/11/2019 0509   CL 98 03/11/2019 0228   CO2 15 (L) 03/11/2019 0228   GLUCOSE 280 (H) 03/11/2019 0228   BUN 50 (H) 03/11/2019 0228   BUN 48 (H) 10/05/2018 1340   CREATININE 3.88 (H) 03/11/2019 0228   CALCIUM 6.4 (LL) 03/11/2019 0228   PROT 4.6 (L) 03/05/2019 1300   PROT 6.1 10/05/2018 1340  ALBUMIN 1.9 (L) 03/11/2019 0228   ALBUMIN 4.0 10/05/2018 1340   AST 57 (H) 03/05/2019 1300   ALT 47 (H) 03/05/2019 1300   ALKPHOS 75 03/05/2019 1300   BILITOT 0.2 (L) 03/05/2019 1300   BILITOT <0.2 10/05/2018 1340   GFRNONAA 14 (L) 03/11/2019 0228   GFRAA 16 (L) 03/11/2019 0228   Lipase  No results found for: LIPASE     Studies/Results: DG Chest Port (Stat)  Result Date: 03/27/2019 CLINICAL DATA:  84 year old male with postop exploratory laparotomy. EXAM: PORTABLE CHEST 1 VIEW COMPARISON:  Earlier radiograph dated 03/02/2019. FINDINGS: Interval worsening of the bilateral interstitial hazy densities, left greater right, likely edema. Pneumonia is not excluded. Clinical correlation is recommended. There is a small left pleural effusion. No pneumothorax. There has been interval removal of the left pigtail catheter. The remainder of the support apparatus appear in similar position. Stable cardiomediastinal silhouette. No acute osseous pathology. IMPRESSION: 1. Interval removal of the left pigtail catheter.  No pneumothorax. 2. Increased bilateral interstitial densities may represent worsening edema. Pneumonia is not excluded. Clinical correlation is recommended.  Electronically Signed   By: Anner Crete M.D.   On: 03/21/2019 16:52   DG Chest Port 1 View  Result Date: 03/11/2019 CLINICAL DATA:  Status. EXAM: PORTABLE CHEST 1 VIEW COMPARISON:  Same day. FINDINGS: Stable cardiomediastinal silhouette. Endotracheal and nasogastric tubes appear to be in grossly good position. Right subclavian catheter is unchanged. Left-sided pigtail catheter is noted without pneumothorax. Mild bibasilar subsegmental atelectasis is noted. Small left pleural effusion is noted. Bony thorax is unremarkable. IMPRESSION: Left-sided pigtail catheter is noted without pneumothorax. Mild bibasilar subsegmental atelectasis is noted. Small left pleural effusion is noted. Endotracheal and nasogastric tubes are in grossly good position. Electronically Signed   By: Marijo Conception M.D.   On: 03/27/2019 07:55   DG CHEST PORT 1 VIEW  Result Date: 03/01/2019 CLINICAL DATA:  Pneumothorax EXAM: PORTABLE CHEST 1 VIEW COMPARISON:  March 09, 2019 FINDINGS: The heart size and mediastinal contours are unchanged. Aortic knob calcifications. Left-sided pigtail catheter is noted. There is a small left apical pneumothorax present. Mildly increased interstitial markings are seen throughout the left lung. The right lung is clear. Again noted is a right-sided PICC catheter with the tip at the superior cavoatrial junction. Enteric tube is seen coursing below the diaphragm. Overlying median sternotomy wires. IMPRESSION: New small left apical pneumothorax. These results will be called to the ordering clinician or representative by the Radiologist Assistant, and communication documented in the PACS or zVision Dashboard. Electronically Signed   By: Prudencio Pair M.D.   On: 03/03/2019 03:01   DG Chest Port 1 View  Result Date: 03/09/2019 CLINICAL DATA:  Status post left thoracentesis today. EXAM: PORTABLE CHEST 1 VIEW COMPARISON:  Single-view of the chest 03/08/2019. FINDINGS: Feeding tube in pigtail catheter in the  left chest are unchanged. Left pleural effusion is decreased after thoracentesis. No pneumothorax. Airspace disease in the left chest persist. Right lung is clear. Cardiomegaly again noted. IMPRESSION: Decreased left effusion after thoracentesis. Negative for pneumothorax. Electronically Signed   By: Inge Rise M.D.   On: 03/09/2019 11:56   DG Abd Portable 1V  Result Date: 03/27/2019 CLINICAL DATA:  Orogastric tube placement EXAM: PORTABLE ABDOMEN - 1 VIEW COMPARISON:  Abdominal CT 10/13/2018 FINDINGS: Feeding and suction tube tips terminate over the upper stomach. There is a catheter tip over the left sacral ala. The bowel gas pattern is nonobstructive. Small left pleural effusion with lower lobe  opacity, reference dedicated chest x-ray. IMPRESSION: Gastric and feeding tube tips overlap the stomach. Electronically Signed   By: Monte Fantasia M.D.   On: 02/26/2019 06:21   IR THORACENTESIS ASP PLEURAL SPACE W/IMG GUIDE  Result Date: 03/09/2019 INDICATION: Post open heart surgery. Persistent left-sided pleural effusion despite chest tube placement. Request for therapeutic thoracentesis. EXAM: ULTRASOUND GUIDED LEFT THORACENTESIS MEDICATIONS: None. COMPLICATIONS: None immediate. PROCEDURE: An ultrasound guided thoracentesis was thoroughly discussed with the patient and questions answered. The benefits, risks, alternatives and complications were also discussed. The patient understands and wishes to proceed with the procedure. Written consent was obtained. Ultrasound was performed to localize and mark an adequate pocket of fluid in the left chest. The area was then prepped and draped in the normal sterile fashion. 1% Lidocaine was used for local anesthesia. Under ultrasound guidance a 6 Fr Safe-T-Centesis catheter was introduced. Thoracentesis was performed. The catheter was removed and a dressing applied. FINDINGS: A total of approximately 1200 mL of dark bloody fluid was removed. IMPRESSION: Successful  ultrasound guided left thoracentesis yielding 1200 mL of pleural fluid. Read by: Ascencion Dike PA-C Electronically Signed   By: Sandi Mariscal M.D.   On: 03/09/2019 12:02    Anti-infectives: Anti-infectives (From admission, onward)   Start     Dose/Rate Route Frequency Ordered Stop   03/11/19 2200  anidulafungin (ERAXIS) 100 mg in sodium chloride 0.9 % 100 mL IVPB     100 mg 78 mL/hr over 100 Minutes Intravenous Every 24 hours 03/12/2019 2248     03/11/19 1200  vancomycin (VANCOREADY) IVPB 750 mg/150 mL  Status:  Discontinued     750 mg 150 mL/hr over 60 Minutes Intravenous Every 24 hours 03/03/2019 1153 03/25/2019 1919   03/11/19 0700  vancomycin (VANCOREADY) IVPB 750 mg/150 mL     750 mg 150 mL/hr over 60 Minutes Intravenous Every 24 hours 03/18/2019 1919     03/11/19 0600  ceFEPIme (MAXIPIME) 2 g in sodium chloride 0.9 % 100 mL IVPB     2 g 200 mL/hr over 30 Minutes Intravenous Every 12 hours 03/25/2019 1919     03/11/19 0000  ceFEPIme (MAXIPIME) 2 g in sodium chloride 0.9 % 100 mL IVPB  Status:  Discontinued     2 g 200 mL/hr over 30 Minutes Intravenous Every 12 hours 03/05/2019 1106 03/15/2019 1919   03/02/2019 2300  metroNIDAZOLE (FLAGYL) IVPB 500 mg     500 mg 100 mL/hr over 60 Minutes Intravenous Every 8 hours 03/12/2019 2245     03/19/2019 2300  anidulafungin (ERAXIS) 200 mg in sodium chloride 0.9 % 200 mL IVPB     200 mg 78 mL/hr over 200 Minutes Intravenous  Once 03/19/2019 2248 03/11/19 0448   03/12/2019 2000  ceFEPIme (MAXIPIME) 2 g in sodium chloride 0.9 % 100 mL IVPB  Status:  Discontinued     2 g 200 mL/hr over 30 Minutes Intravenous Every M-W-F (2000) 03/03/2019 0027 03/01/2019 1106   03/02/2019 1200  vancomycin (VANCOREADY) IVPB 1500 mg/300 mL     1,500 mg 150 mL/hr over 120 Minutes Intravenous  Once 03/12/2019 1153 03/18/2019 1900   03/06/2019 0030  ceFEPIme (MAXIPIME) 2 g in sodium chloride 0.9 % 100 mL IVPB     2 g 200 mL/hr over 30 Minutes Intravenous  Once 02/27/2019 0027 03/11/2019 0134   02/25/19  1000  cefUROXime (ZINACEF) 1.5 g in sodium chloride 0.9 % 100 mL IVPB     1.5 g 200 mL/hr over 30 Minutes Intravenous  Every 24 hr x 2 01/29/2019 1844 02/26/19 1011   02/06/2019 2030  vancomycin (VANCOCIN) IVPB 1000 mg/200 mL premix  Status:  Discontinued     1,000 mg 200 mL/hr over 60 Minutes Intravenous  Once 02/18/2019 1454 02/23/2019 1500   01/30/2019 1630  cefUROXime (ZINACEF) 1.5 g in sodium chloride 0.9 % 100 mL IVPB  Status:  Discontinued     1.5 g 200 mL/hr over 30 Minutes Intravenous Every 12 hours 01/31/2019 1454 02/05/2019 1844   01/27/2019 0400  vancomycin (VANCOREADY) IVPB 1250 mg/250 mL     1,250 mg 166.7 mL/hr over 90 Minutes Intravenous To Surgery 01/27/2019 1933 02/09/2019 0915   02/04/2019 0400  cefUROXime (ZINACEF) 1.5 g in sodium chloride 0.9 % 100 mL IVPB     1.5 g 200 mL/hr over 30 Minutes Intravenous To Surgery 02/22/2019 1933 02/09/2019 1455   02/04/2019 0400  cefUROXime (ZINACEF) 750 mg in sodium chloride 0.9 % 100 mL IVPB  Status:  Discontinued     750 mg 200 mL/hr over 30 Minutes Intravenous To Surgery 02/09/2019 1931 02/03/2019 1453       Assessment/Plan ESRD on TTS dialysis HTN DM2 Stroke s/p CEA CAD s/p 3 stents S/p CABG and clipping of atrial appendage on 01/29/2019 VDRF - per CCM Coagulopathy - correcting abnormalities ABL anemia - most recent hgb 7.5, transfusing PRN  Septic shock Infarcted colon S/p EXPLORATORY LAPAROTOMY, SUBTOTAL COLECTOMY, APPLICATION OF WOUND VAC 1/13 Dr. Ninfa Linden - POD#1 - He had about 800cc blood from wound vac. Will discuss with MD, may have to go back to OR sooner for bleeding but likely plan to continue resuscitation and coagulopathy correction today and back to OR tomorrow.  ID - currently maxipime 1/13>>, vancomycin 1/13>>, eraxis 1/13>> VTE - SCDs FEN - NPO/OG Foley - none Follow up - TBD   LOS: 16 days    Wellington Hampshire, [facility] 03/11/2019, 8:46 AM Please see Amion for pager number during day hours  7:00am-4:30pm

## 2019-03-11 NOTE — Progress Notes (Signed)
[facility] Progress Note Patient Name: Robert [name]. DOB: 1935/11/01 MRN: [id number]   Date of Service  03/11/2019  HPI/Events of Note  Hemoglobin 6.5 gm %. Platelets 44 K  eICU Interventions  Transfuse 1 unit PRBC        [name] [name] 03/11/2019, 10:03 PM

## 2019-03-11 NOTE — Progress Notes (Signed)
CRITICAL VALUE ALERT  Critical Value:  Calcium 6.4  Date & Time Notied: 03/11/2019 0423  Provider Notified: Dr. Roxan Hockey  Orders Received/Actions taken: Give 1 amp of Calcium.

## 2019-03-11 NOTE — Progress Notes (Signed)
Occupational Therapy Discharge Patient Details Name: Robert [name]. MRN: [id number] DOB: [birth year] Today's Date: 03/11/2019 Time:  -     Patient discharged from OT services secondary to medical decline - will need to re-order OT to resume therapy services.  Please see latest therapy progress note for current level of functioning and progress toward goals.    Progress and discharge plan discussed with patient and/or caregiver: Patient unable to participate in discharge planning and no caregivers available   Events of last 2 days noted.  OT will sign off at this time.  Please reorder if he becomes appropriate.  [name]., OTR/L Acute Rehabilitation Services Pager [phone] Office [phone]   [place], [name] 03/11/2019, 6:25 AM

## 2019-03-11 NOTE — Progress Notes (Signed)
Nutrition Follow-up  DOCUMENTATION CODES:   Non-severe (moderate) malnutrition in context of chronic illness  INTERVENTION:   If unable to initiate EN recommend starting of TPN to meet 100% of needs (once hemodynamically stable).   NUTRITION DIAGNOSIS:   Moderate Malnutrition related to chronic illness(ESRD/HD, CAD,) as evidenced by moderate fat depletion, moderate muscle depletion.  Ongoing  GOAL:   Patient will meet greater than or equal to 90% of their needs  Not meeting   MONITOR:   TF tolerance, Diet advancement, Labs, Weight trends, Skin  REASON FOR ASSESSMENT:   Consult Enteral/tube feeding initiation and management  ASSESSMENT:   84 yo admitted with NSTEMI, found to have 3 vessel CAD and underwent CABG x 4. PMH includes ESRD on HD, HTN, CAD, stroke, DM  1/5 s/p gastric Cortrak 1/7 tube feeding discontinued in AM 1/7 nocturnal TF support initiated 1/8 tube feeding held per MD, encouraging PO intake 1/11- stop CRRT 1/12- s/p thoracentesis- 1.2 L drained 1/13- infarcted colon- s/p emergent ex lap, subtotal colectomy, abdomen left open     Pt discussed during ICU rounds and with RN.   Pt unstable on multiple pressors. Required multiple PRBC/FFP overnight due to wound VAC bleeding. Unable to pull fluids on CRRT due to severe shock. Plan creation of ileostomy and clousure tomorrow if stable. Cortrak remains in place. Will discuss nutrition plan with CCS after closure.   EDW 73.5 kg Current weight: 74.6 kg (taken 1/11)   Patient is currently intubated on ventilator support MV: 13.3 L/min Temp (24hrs), Avg:96.3 F (35.7 C), Min:93.7 F (34.3 C), Max:99.5 F (37.5 C)   I/O: +17,009 ml since 12/31 CRRT: -175 ml x 24 hrs  OGT: 400 ml x 24 hrs Wound VAC" 800 ml x 24 hrs    Drips: epinephrine, fentanyl, levophed, vasopressin, sodium bicarb Medications: b complex with C, aranesp, hectorol, solucortef, SS novolog,  Labs: corrected calcium 8.1 (L) Phosphorus  7.8 (H) CBG 64-280  Diet Order:   Diet Order            Diet NPO time specified  Diet effective now              EDUCATION NEEDS:   Not appropriate for education at this time  Skin:  Skin Assessment: Skin Integrity Issues: Skin Integrity Issues:: Incisions, Wound VAC Wound Vac: open abdomen Incisions: R/L leg, arm, chest  Last BM:  1/12  Height:   Ht Readings from Last 1 Encounters:  03/23/2019 5\' 8"  (1.727 m)    Weight:   Wt Readings from Last 1 Encounters:  03/08/19 74.6 kg    Ideal Body Weight:  70 kg  BMI:  Body mass index is 25.01 kg/m.  Estimated Nutritional Needs:   Kcal:  1521 kcal  Protein:  115-130 grams  Fluid:  1000 ml + UOP (liberalized with CRRT)   Mariana Single RD, LDN Clinical Nutrition Pager # [phone]

## 2019-03-11 NOTE — Progress Notes (Signed)
Patient's ABG results were reported to Dr. Roxan Hockey. He is aware of the continuous bleeding from his wound vac. Morning labs was also discussed. Verbal orders were to give 2 more FFP, 1 PRBC, 2 amps of Bicarbonate, and 1 amp of calcium chloride. Will recheck an ABG and lactic acid in one hour. Will continue to monitor for any changes.

## 2019-03-11 NOTE — Progress Notes (Signed)
1 Day Post-Op Procedure(s) (LRB): EXPLORATORY LAPAROTOMY (N/A) SUBTOTAL COLECTOMY (N/A) APPLICATION OF WOUND VAC (N/A) Subjective: Intubated, sedated  Objective: Vital signs in last 24 hours: Temp:  [93.7 F (34.3 C)-99.5 F (37.5 C)] 96.8 F (36 C) (01/14 0700) Pulse Rate:  [34-123] 63 (01/14 0700) Cardiac Rhythm: Normal sinus rhythm (01/14 0400) Resp:  [18-33] 22 (01/14 0700) BP: (86-145)/(28-83) 104/46 (01/14 0700) SpO2:  [80 %-100 %] 96 % (01/14 0700) FiO2 (%):  [80 %-100 %] 100 % (01/14 0400)  Hemodynamic parameters for last 24 hours: CVP:  [8 mmHg-57 mmHg] 57 mmHg  Intake/Output from previous day: 01/13 0701 - 01/14 0700 In: 14125.5 [I.V.:9299.3; Blood:2996; NG/GT:180; IV Piggyback:1650.2] Out: 4128 [Emesis/NG output:400; Drains:1400; Blood:100] Intake/Output this shift: No intake/output data recorded.  General appearance: intubated Heart: regular rate and rhythm Lungs: clear anteriorly Abdomen: VAC in place Extremities: warm but mildly cyanotic fingers and toes  Lab Results: Recent Labs    03/09/2019 2133 03/03/2019 2151 03/11/19 0228 03/11/19 0228 03/11/19 0500 03/11/19 0509  WBC 15.1*  --  14.9*  --   --   --   HGB 8.0*   < > 8.4*   < > 7.5* 7.5*  HCT 27.4*   < > 26.8*   < > 22.0* 22.0*  PLT 93*  --  98*  97*  --   --   --    < > = values in this interval not displayed.   BMET:  Recent Labs    03/03/2019 1632 03/26/2019 2151 03/11/19 0228 03/11/19 0228 03/11/19 0500 03/11/19 0509  NA 144   < > 145   < > 142 142  K 3.7   < > 3.8   < > 4.1 4.0  CL 101  --  98  --   --   --   CO2 14*  --  15*  --   --   --   GLUCOSE 197*  --  280*  --   --   --   BUN 75*  --  50*  --   --   --   CREATININE 5.11*  --  3.88*  --   --   --   CALCIUM 8.4*  --  6.4*  --   --   --    < > = values in this interval not displayed.    PT/INR:  Recent Labs    03/11/19 0228  LABPROT 26.1*  INR 2.4*   ABG    Component Value Date/Time   PHART 7.171 (LL) 03/11/2019 0509    HCO3 15.0 (L) 03/11/2019 0509   TCO2 16 (L) 03/11/2019 0509   ACIDBASEDEF 12.0 (H) 03/11/2019 0509   O2SAT 94.0 03/11/2019 0509   CBG (last 3)  Recent Labs    03/11/19 0500 03/11/19 0503 03/11/19 0737  GLUCAP 274* 212* 230*    Assessment/Plan: S/P Procedure(s) (LRB): EXPLORATORY LAPAROTOMY (N/A) SUBTOTAL COLECTOMY (N/A) APPLICATION OF WOUND VAC (N/A) - Remains critically ill  CV- good cardiac output in setting of sepsis, low SVR  Requiring multiple pressors to maintain BP RESP_ on 100% and 12 of PEEP  Vent per CCM RENAL- on CVVHD running volume even GI/ID- s/p subtotal colectomy for dead bowel with resultant sepsis  Sp colectomy, VAC in place- probable relook tomorrow- plan per GS  On vanco, maxipime, flagyl ENDO- CBG elevated- will start insulin drip Coagulopathy- correcting coagulation abnormalities Anemia secondary to ABL- transfuse as needed   LOS: 16 days    Melrose Nakayama  03/11/2019  

## 2019-03-11 NOTE — Progress Notes (Signed)
Patient's systolic BP was in the 65'H while maxed out on pressors. ABG was done and the results showed a pH (7.03), pCO2 (48), pO2 (111), Bicarb (12.7). Elink was notified. Verbal orders were to give 3 amps of Bicarb. ELink was also made aware of the patient putting out 450 from the wound vac and Hgb of 8, platelets 93, and INR 3.1. Additional orders were to give 2 units of FFP and notify the on call surgeon. MD also requested to check a lactic acid and ABG in one hour. Will continue to monitor.

## 2019-03-11 NOTE — Progress Notes (Signed)
Progress Note  Patient Name: Robert [name]. Date of Encounter: 03/11/2019  Primary Cardiologist: Shirlee More, MD   Subjective   Events of yesterday reviewed. Patient with marked deterioration requiring intubation, shock on pressors. Acute abdomen. Went to OR with findings of infarcted bowel. S/p colectomy. Now on vent, high dose pressors.   Inpatient Medications    Scheduled Meds: . sodium chloride   Intravenous Once  . sodium chloride   Intravenous Once  . sodium chloride   Intravenous Once  . sodium chloride   Intravenous Once  . sodium chloride   Intravenous Once  . sodium chloride   Intravenous Once  . aspirin EC  325 mg Oral Daily   Or  . aspirin  324 mg Per Tube Daily  . B-complex with vitamin C  1 tablet Per Tube Daily  . calcium chloride      . chlorhexidine gluconate (MEDLINE KIT)  15 mL Mouth Rinse BID  . Chlorhexidine Gluconate Cloth  6 each Topical Daily  . darbepoetin (ARANESP) injection - DIALYSIS  60 mcg Subcutaneous Q Tue  . doxercalciferol  4 mcg Intravenous Once per day on Tue Thu Sat  . feeding supplement (PRO-STAT SUGAR FREE 64)  30 mL Per Tube BID  . hydrocortisone sod succinate (SOLU-CORTEF) inj  50 mg Intravenous Q6H  . insulin aspart  0-24 Units Subcutaneous Q4H  . levothyroxine  50 mcg Per Tube Q0600  . mouth rinse  15 mL Mouth Rinse 10 times per day  . midodrine  10 mg Per Tube TID WC  . pantoprazole sodium  40 mg Per Tube Daily  . Racepinephrine HCl      . rosuvastatin  40 mg Per Tube q1800  . sodium bicarbonate      . sodium bicarbonate      . sodium chloride flush  10-40 mL Intracatheter Q12H  . sodium chloride flush  3 mL Intravenous Q12H   Continuous Infusions: . sodium chloride    . sodium chloride 20 mL/hr at 02/28/2019 1730  . albumin human    . amiodarone 30 mg/hr (03/11/19 0800)  . anidulafungin    . calcium gluconate    . ceFEPime (MAXIPIME) IV Stopped (03/11/19 0553)  . dexmedetomidine (PRECEDEX) IV infusion 0.5  mcg/kg/hr (03/26/2019 1541)  . dextrose 25 mL/hr at 03/11/19 0800  . epinephrine 11 mcg/min (03/11/19 0600)  . epinephrine 11 mcg/min (03/11/19 0800)  . fentaNYL infusion INTRAVENOUS 150 mcg/hr (03/11/19 0800)  . metronidazole Stopped (03/11/19 0717)  . norepinephrine (LEVOPHED) Adult infusion 50 mcg/min (03/11/19 0800)  . phenylephrine (NEO-SYNEPHRINE) Adult infusion 400 mcg/min (03/11/19 0849)  . prismasol BGK 4/2.5 1,700 mL/hr at 03/09/2019 1906  . sodium bicarbonate 150 mEq in dextrose 5% 1000 mL 250 mL/hr at 03/11/19 0800  . sodium bicarbonate (isotonic) 1000 mL infusion 75 mL/hr at 03/11/19 0849  . sodium bicarbonate (isotonic) 1000 mL infusion 75 mL/hr at 03/11/19 0849  . vancomycin Stopped (03/11/19 0717)  . vasopressin (PITRESSIN) infusion - *FOR SHOCK* 0.04 Units/min (03/11/19 0800)   PRN Meds: albuterol, alteplase, budesonide (PULMICORT) nebulizer solution, dextrose, heparin, lidocaine (PF), metoprolol tartrate, midazolam, morphine injection, ondansetron (ZOFRAN) IV, sodium chloride, sodium chloride flush, sodium chloride flush   Vital Signs    Vitals:   03/11/19 0645 03/11/19 0700 03/11/19 0806 03/11/19 0830  BP: (!) 109/47 (!) 104/46  (!) 108/49  Pulse: 64 63 68 65  Resp: (!) 22 (!) 22 (!) 22 (!) 22  Temp: (!) 97.3 F (36.3 C) (!)  96.8 F (36 C) (!) 95.9 F (35.5 C) (!) 95.5 F (35.3 C)  TempSrc:      SpO2: 95% 96% 96% 95%  Weight:      Height:        Intake/Output Summary (Last 24 hours) at 03/11/2019 0851 Last data filed at 03/11/2019 0800 Gross per 24 hour  Intake 14579.78 ml  Output 1726 ml  Net 12853.78 ml   Last 3 Weights 03/08/2019 03/08/2019 03/07/2019  Weight (lbs) 164 lb 7.4 oz 162 lb 4.1 oz 170 lb 13.7 oz  Weight (kg) 74.6 kg 73.6 kg 77.5 kg      Telemetry    NSR. Converted from Afib at 16:25 yesterday- Personally Reviewed  Physical Exam   GEN: Elderly WM, intubated. unresponsive Neck: No JVD, supple. Dialysis catheter in place Cardiac: RRR,  distant heart sounds. Respiratory: clear GI: open abdominal wound.  Neuro: sedated on vent.   Labs    High Sensitivity Troponin:   Recent Labs  Lab 02/12/2019 1736 02/02/2019 1922 02/01/2019 2200 02/15/2019 0124 02/09/2019 0955  TROPONINIHS 81* 856* 4,043* 4,270* 4,018*      Chemistry Recent Labs  Lab 03/05/19 1300 03/06/19 1010 03/03/2019 0410 03/09/2019 0432 03/16/2019 1632 03/20/2019 2151 03/11/19 0228 03/11/19 0500 03/11/19 0509  NA  --    < > 138   < > 144   < > 145 142 142  K  --    < > 4.1   < > 3.7   < > 3.8 4.1 4.0  CL  --    < > 99  --  101  --  98  --   --   CO2  --    < > 21*  --  14*  --  15*  --   --   GLUCOSE  --    < > 140*  --  197*  --  280*  --   --   BUN  --    < > 82*  --  75*  --  50*  --   --   CREATININE  --    < > 5.29*  --  5.11*  --  3.88*  --   --   CALCIUM  --    < > 8.0*  --  8.4*  --  6.4*  --   --   PROT 4.6*  --   --   --   --   --   --   --   --   ALBUMIN 2.0*   < > 2.0*  --  1.7*  --  1.9*  --   --   AST 57*  --   --   --   --   --   --   --   --   ALT 47*  --   --   --   --   --   --   --   --   ALKPHOS 75  --   --   --   --   --   --   --   --   BILITOT 0.2*  --   --   --   --   --   --   --   --   GFRNONAA  --    < > 9*  --  10*  --  14*  --   --   GFRAA  --    < > 11*  --  11*  --  16*  --   --   ANIONGAP  --    < > 18*  --  29*  --  32*  --   --    < > = values in this interval not displayed.     Hematology Recent Labs  Lab 03/18/2019 1838 03/06/2019 1838 02/28/2019 2133 03/11/2019 2151 03/11/19 0228 03/11/19 0500 03/11/19 0509  WBC 9.1  --  15.1*  --  14.9*  --   --   RBC 2.45*  --  2.70*  --  2.74*  --   --   HGB 7.3*   < > 8.0*   < > 8.4* 7.5* 7.5*  HCT 25.7*   < > 27.4*   < > 26.8* 22.0* 22.0*  MCV 104.9*  --  101.5*  --  97.8  --   --   MCH 29.8  --  29.6  --  30.7  --   --   MCHC 28.4*  --  29.2*  --  31.3  --   --   RDW 19.0*  --  18.9*  --  18.1*  --   --   PLT 62*  --  93*  --  98*  97*  --   --    < > = values in this  interval not displayed.    Patient Profile     Robert [name]. is a 84 y.o. male with ESRD, HTN, CAD s/p PCI, Stroke s/p CEA, DM admitted 12/29 for new-onset Afib. Subsequently had NSTEMI and found to have 3vCAD. S/p CABG x 4 02/03/2019.  Note preoperative echocardiogram showed normal LV function, mild mitral regurgitation, moderate aortic stenosis.  Assessment & Plan    1.  Atrial fibrillation with RVR recurrent. Had converted post op on amiodarone then recurred. Now in NSR on IV amiodarone.  Unable to give Cardizem or beta blocker due to shock. He did have LA clipping. [place] vasc score of 7. Not a candidate for anticoagulation due to coagulopathy  2.  NSTEMI/ with severe 3 vessel coronary artery disease status post coronary artery bypass graft-02/07/2019.   3.  Moderate aortic stenosis  4.  End-stage renal disease-dialysis per nephrology.  5.  Septic shock requiring high dose and multiple pressors. On antibiotics. He has severe metabolic acidosis and coagulopathy.   6.  Acute hypercarbic respiratory failure. On ventilator support.   7. Ischemic bowel s/p emergent colectomy yesterday.   8. History of stroke status post carotid endarterectomy.  For questions or updates, please contact [facility] Please consult [email or web address] for contact info under        Signed, Makya Phillis [place], MD  03/11/2019, 8:51 AM

## 2019-03-12 ENCOUNTER — Encounter (HOSPITAL_COMMUNITY): Payer: Self-pay | Admitting: Thoracic Surgery (Cardiothoracic Vascular Surgery)

## 2019-03-12 ENCOUNTER — Encounter (HOSPITAL_COMMUNITY)
Admission: EM | Disposition: E | Payer: Self-pay | Source: Home / Self Care | Attending: Thoracic Surgery (Cardiothoracic Vascular Surgery)

## 2019-03-12 ENCOUNTER — Inpatient Hospital Stay (HOSPITAL_COMMUNITY): Payer: Medicare Other

## 2019-03-12 ENCOUNTER — Inpatient Hospital Stay (HOSPITAL_COMMUNITY): Payer: Medicare Other | Admitting: Anesthesiology

## 2019-03-12 DIAGNOSIS — J9601 Acute respiratory failure with hypoxia: Secondary | ICD-10-CM

## 2019-03-12 HISTORY — PX: LAPAROTOMY: SHX154

## 2019-03-12 HISTORY — PX: COLOSTOMY: SHX63

## 2019-03-12 LAB — CBC
HCT: 19.1 % — ABNORMAL LOW (ref 39.0–52.0)
HCT: 24.8 % — ABNORMAL LOW (ref 39.0–52.0)
HCT: 25.4 % — ABNORMAL LOW (ref 39.0–52.0)
HCT: 26.8 % — ABNORMAL LOW (ref 39.0–52.0)
Hemoglobin: 6 g/dL — CL (ref 13.0–17.0)
Hemoglobin: 8.1 g/dL — ABNORMAL LOW (ref 13.0–17.0)
Hemoglobin: 8.4 g/dL — ABNORMAL LOW (ref 13.0–17.0)
Hemoglobin: 8.6 g/dL — ABNORMAL LOW (ref 13.0–17.0)
MCH: 29.5 pg (ref 26.0–34.0)
MCH: 30.2 pg (ref 26.0–34.0)
MCH: 30.7 pg (ref 26.0–34.0)
MCH: 30.8 pg (ref 26.0–34.0)
MCHC: 31.3 g/dL (ref 30.0–36.0)
MCHC: 31.4 g/dL (ref 30.0–36.0)
MCHC: 32.7 g/dL (ref 30.0–36.0)
MCHC: 33.9 g/dL (ref 30.0–36.0)
MCV: 89.1 fL (ref 80.0–100.0)
MCV: 90.2 fL (ref 80.0–100.0)
MCV: 97.8 fL (ref 80.0–100.0)
MCV: 97.9 fL (ref 80.0–100.0)
Platelets: 44 10*3/uL — ABNORMAL LOW (ref 150–400)
Platelets: 65 10*3/uL — ABNORMAL LOW (ref 150–400)
Platelets: 79 10*3/uL — ABNORMAL LOW (ref 150–400)
Platelets: 98 10*3/uL — ABNORMAL LOW (ref 150–400)
RBC: 1.95 MIL/uL — ABNORMAL LOW (ref 4.22–5.81)
RBC: 2.74 MIL/uL — ABNORMAL LOW (ref 4.22–5.81)
RBC: 2.75 MIL/uL — ABNORMAL LOW (ref 4.22–5.81)
RBC: 2.85 MIL/uL — ABNORMAL LOW (ref 4.22–5.81)
RDW: 17.6 % — ABNORMAL HIGH (ref 11.5–15.5)
RDW: 17.6 % — ABNORMAL HIGH (ref 11.5–15.5)
RDW: 18.1 % — ABNORMAL HIGH (ref 11.5–15.5)
RDW: 18.7 % — ABNORMAL HIGH (ref 11.5–15.5)
WBC: 14.9 10*3/uL — ABNORMAL HIGH (ref 4.0–10.5)
WBC: 16.7 10*3/uL — ABNORMAL HIGH (ref 4.0–10.5)
WBC: 18.4 10*3/uL — ABNORMAL HIGH (ref 4.0–10.5)
WBC: 20 10*3/uL — ABNORMAL HIGH (ref 4.0–10.5)
nRBC: 21.2 % — ABNORMAL HIGH (ref 0.0–0.2)
nRBC: 22.2 % — ABNORMAL HIGH (ref 0.0–0.2)
nRBC: 23.3 % — ABNORMAL HIGH (ref 0.0–0.2)
nRBC: 35.1 % — ABNORMAL HIGH (ref 0.0–0.2)

## 2019-03-12 LAB — BPAM PLATELET PHERESIS
Blood Product Expiration Date: 202101152359
ISSUE DATE / TIME: 202101142201
Unit Type and Rh: 600

## 2019-03-12 LAB — HEPATIC FUNCTION PANEL
ALT: 1338 U/L — ABNORMAL HIGH (ref 0–44)
AST: 6571 U/L — ABNORMAL HIGH (ref 15–41)
Albumin: 2.1 g/dL — ABNORMAL LOW (ref 3.5–5.0)
Alkaline Phosphatase: 345 U/L — ABNORMAL HIGH (ref 38–126)
Bilirubin, Direct: 6 mg/dL — ABNORMAL HIGH (ref 0.0–0.2)
Indirect Bilirubin: 4 mg/dL — ABNORMAL HIGH (ref 0.3–0.9)
Total Bilirubin: 10 mg/dL — ABNORMAL HIGH (ref 0.3–1.2)
Total Protein: 4.2 g/dL — ABNORMAL LOW (ref 6.5–8.1)

## 2019-03-12 LAB — PREPARE FRESH FROZEN PLASMA
Unit division: 0
Unit division: 0
Unit division: 0
Unit division: 0

## 2019-03-12 LAB — POCT I-STAT 7, (LYTES, BLD GAS, ICA,H+H)
Acid-Base Excess: 4 mmol/L — ABNORMAL HIGH (ref 0.0–2.0)
Acid-base deficit: 7 mmol/L — ABNORMAL HIGH (ref 0.0–2.0)
Acid-base deficit: 9 mmol/L — ABNORMAL HIGH (ref 0.0–2.0)
Acid-base deficit: 12 mmol/L — ABNORMAL HIGH (ref 0.0–2.0)
Acid-base deficit: 9 mmol/L — ABNORMAL HIGH (ref 0.0–2.0)
Acid-base deficit: 8 mmol/L — ABNORMAL HIGH (ref 0.0–2.0)
Bicarbonate: 28.4 mmol/L — ABNORMAL HIGH (ref 20.0–28.0)
Bicarbonate: 19.5 mmol/L — ABNORMAL LOW (ref 20.0–28.0)
Bicarbonate: 19.6 mmol/L — ABNORMAL LOW (ref 20.0–28.0)
Bicarbonate: 15.5 mmol/L — ABNORMAL LOW (ref 20.0–28.0)
Bicarbonate: 17.9 mmol/L — ABNORMAL LOW (ref 20.0–28.0)
Bicarbonate: 18 mmol/L — ABNORMAL LOW (ref 20.0–28.0)
Calcium, Ion: 0.78 mmol/L — CL (ref 1.15–1.40)
Calcium, Ion: 0.73 mmol/L — CL (ref 1.15–1.40)
Calcium, Ion: 0.73 mmol/L — CL (ref 1.15–1.40)
Calcium, Ion: 0.83 mmol/L — CL (ref 1.15–1.40)
Calcium, Ion: 0.86 mmol/L — CL (ref 1.15–1.40)
Calcium, Ion: 0.74 mmol/L — CL (ref 1.15–1.40)
HCT: 23 % — ABNORMAL LOW (ref 39.0–52.0)
HCT: 22 % — ABNORMAL LOW (ref 39.0–52.0)
HCT: 22 % — ABNORMAL LOW (ref 39.0–52.0)
HCT: 15 % — ABNORMAL LOW (ref 39.0–52.0)
HCT: 18 % — ABNORMAL LOW (ref 39.0–52.0)
HCT: 29 % — ABNORMAL LOW (ref 39.0–52.0)
Hemoglobin: 7.8 g/dL — ABNORMAL LOW (ref 13.0–17.0)
Hemoglobin: 7.5 g/dL — ABNORMAL LOW (ref 13.0–17.0)
Hemoglobin: 7.5 g/dL — ABNORMAL LOW (ref 13.0–17.0)
Hemoglobin: 5.1 g/dL — CL (ref 13.0–17.0)
Hemoglobin: 6.1 g/dL — CL (ref 13.0–17.0)
Hemoglobin: 9.9 g/dL — ABNORMAL LOW (ref 13.0–17.0)
O2 Saturation: 100 %
O2 Saturation: 98 %
O2 Saturation: 95 %
O2 Saturation: 97 %
O2 Saturation: 99 %
O2 Saturation: 99 %
Patient temperature: 36.1
Patient temperature: 35.7
Patient temperature: 36.1
Patient temperature: 35.4
Patient temperature: 35.3
Patient temperature: 35.8
Potassium: 4 mmol/L (ref 3.5–5.1)
Potassium: 4.5 mmol/L (ref 3.5–5.1)
Potassium: 4.5 mmol/L (ref 3.5–5.1)
Potassium: 4.6 mmol/L (ref 3.5–5.1)
Potassium: 4.5 mmol/L (ref 3.5–5.1)
Potassium: 5.1 mmol/L (ref 3.5–5.1)
Sodium: 138 mmol/L (ref 135–145)
Sodium: 137 mmol/L (ref 135–145)
Sodium: 137 mmol/L (ref 135–145)
Sodium: 137 mmol/L (ref 135–145)
Sodium: 140 mmol/L (ref 135–145)
Sodium: 136 mmol/L (ref 135–145)
TCO2: 30 mmol/L (ref 22–32)
TCO2: 21 mmol/L — ABNORMAL LOW (ref 22–32)
TCO2: 21 mmol/L — ABNORMAL LOW (ref 22–32)
TCO2: 17 mmol/L — ABNORMAL LOW (ref 22–32)
TCO2: 19 mmol/L — ABNORMAL LOW (ref 22–32)
TCO2: 19 mmol/L — ABNORMAL LOW (ref 22–32)
pCO2 arterial: 38.1 mmHg (ref 32.0–48.0)
pCO2 arterial: 39.2 mmHg (ref 32.0–48.0)
pCO2 arterial: 55.4 mmHg — ABNORMAL HIGH (ref 32.0–48.0)
pCO2 arterial: 37.9 mmHg (ref 32.0–48.0)
pCO2 arterial: 38.2 mmHg (ref 32.0–48.0)
pCO2 arterial: 37.8 mmHg (ref 32.0–48.0)
pH, Arterial: 7.476 — ABNORMAL HIGH (ref 7.350–7.450)
pH, Arterial: 7.298 — ABNORMAL LOW (ref 7.350–7.450)
pH, Arterial: 7.152 — CL (ref 7.350–7.450)
pH, Arterial: 7.212 — ABNORMAL LOW (ref 7.350–7.450)
pH, Arterial: 7.271 — ABNORMAL LOW (ref 7.350–7.450)
pH, Arterial: 7.281 — ABNORMAL LOW (ref 7.350–7.450)
pO2, Arterial: 204 mmHg — ABNORMAL HIGH (ref 83.0–108.0)
pO2, Arterial: 101 mmHg (ref 83.0–108.0)
pO2, Arterial: 91 mmHg (ref 83.0–108.0)
pO2, Arterial: 103 mmHg (ref 83.0–108.0)
pO2, Arterial: 129 mmHg — ABNORMAL HIGH (ref 83.0–108.0)
pO2, Arterial: 139 mmHg — ABNORMAL HIGH (ref 83.0–108.0)

## 2019-03-12 LAB — BPAM FFP
Blood Product Expiration Date: 202101182359
Blood Product Expiration Date: 202101182359
Blood Product Expiration Date: 202101192359
Blood Product Expiration Date: 202101192359
ISSUE DATE / TIME: 202101140811
ISSUE DATE / TIME: 202101140811
ISSUE DATE / TIME: 202101141340
ISSUE DATE / TIME: 202101141340
Unit Type and Rh: 6200
Unit Type and Rh: 6200
Unit Type and Rh: 6200
Unit Type and Rh: 6200

## 2019-03-12 LAB — PREPARE PLATELET PHERESIS: Unit division: 0

## 2019-03-12 LAB — RENAL FUNCTION PANEL
Albumin: 2.1 g/dL — ABNORMAL LOW (ref 3.5–5.0)
Albumin: 1.7 g/dL — ABNORMAL LOW (ref 3.5–5.0)
Anion gap: 23 — ABNORMAL HIGH (ref 5–15)
Anion gap: 34 — ABNORMAL HIGH (ref 5–15)
BUN: 25 mg/dL — ABNORMAL HIGH (ref 8–23)
BUN: 20 mg/dL (ref 8–23)
CO2: 25 mmol/L (ref 22–32)
CO2: 22 mmol/L (ref 22–32)
Calcium: 6.3 mg/dL — CL (ref 8.9–10.3)
Calcium: 7.8 mg/dL — ABNORMAL LOW (ref 8.9–10.3)
Chloride: 91 mmol/L — ABNORMAL LOW (ref 98–111)
Chloride: 93 mmol/L — ABNORMAL LOW (ref 98–111)
Creatinine, Ser: 2.28 mg/dL — ABNORMAL HIGH (ref 0.61–1.24)
Creatinine, Ser: 2.02 mg/dL — ABNORMAL HIGH (ref 0.61–1.24)
GFR calc Af Amer: 30 mL/min — ABNORMAL LOW (ref >60)
GFR calc Af Amer: 35 mL/min — ABNORMAL LOW (ref >60)
GFR calc non Af Amer: 26 mL/min — ABNORMAL LOW (ref >60)
GFR calc non Af Amer: 30 mL/min — ABNORMAL LOW (ref >60)
Glucose, Bld: 76 mg/dL (ref 70–99)
Glucose, Bld: 104 mg/dL — ABNORMAL HIGH (ref 70–99)
Phosphorus: 4 mg/dL (ref 2.5–4.6)
Phosphorus: 6.7 mg/dL — ABNORMAL HIGH (ref 2.5–4.6)
Potassium: 4.2 mmol/L (ref 3.5–5.1)
Potassium: 4.8 mmol/L (ref 3.5–5.1)
Sodium: 139 mmol/L (ref 135–145)
Sodium: 149 mmol/L — ABNORMAL HIGH (ref 135–145)

## 2019-03-12 LAB — DIC (DISSEMINATED INTRAVASCULAR COAGULATION)PANEL
D-Dimer, Quant: 15.63 ug/mL-FEU — ABNORMAL HIGH (ref 0.00–0.50)
Fibrinogen: 191 mg/dL — ABNORMAL LOW (ref 210–475)
INR: 3 — ABNORMAL HIGH (ref 0.8–1.2)
Platelets: 36 10*3/uL — ABNORMAL LOW (ref 150–400)
Prothrombin Time: 31.3 seconds — ABNORMAL HIGH (ref 11.4–15.2)
Smear Review: NONE SEEN
aPTT: 80 seconds — ABNORMAL HIGH (ref 24–36)

## 2019-03-12 LAB — PREPARE RBC (CROSSMATCH)

## 2019-03-12 LAB — GLUCOSE, CAPILLARY
Glucose-Capillary: 80 mg/dL (ref 70–99)
Glucose-Capillary: 81 mg/dL (ref 70–99)
Glucose-Capillary: 72 mg/dL (ref 70–99)
Glucose-Capillary: 65 mg/dL — ABNORMAL LOW (ref 70–99)
Glucose-Capillary: 146 mg/dL — ABNORMAL HIGH (ref 70–99)
Glucose-Capillary: 88 mg/dL (ref 70–99)
Glucose-Capillary: 77 mg/dL (ref 70–99)
Glucose-Capillary: 68 mg/dL — ABNORMAL LOW (ref 70–99)
Glucose-Capillary: 93 mg/dL (ref 70–99)
Glucose-Capillary: 63 mg/dL — ABNORMAL LOW (ref 70–99)
Glucose-Capillary: 137 mg/dL — ABNORMAL HIGH (ref 70–99)
Glucose-Capillary: 107 mg/dL — ABNORMAL HIGH (ref 70–99)
Glucose-Capillary: 120 mg/dL — ABNORMAL HIGH (ref 70–99)
Glucose-Capillary: 96 mg/dL (ref 70–99)

## 2019-03-12 LAB — CULTURE, RESPIRATORY W GRAM STAIN: Culture: NORMAL

## 2019-03-12 LAB — LACTIC ACID, PLASMA
Lactic Acid, Venous: >11 mmol/L (ref 0.5–1.9)
Lactic Acid, Venous: >11 mmol/L (ref 0.5–1.9)

## 2019-03-12 LAB — SURGICAL PATHOLOGY

## 2019-03-12 LAB — MAGNESIUM: Magnesium: 1.9 mg/dL (ref 1.7–2.4)

## 2019-03-12 LAB — APTT: aPTT: 67 seconds — ABNORMAL HIGH (ref 24–36)

## 2019-03-12 LAB — PROTIME-INR
INR: 1.9 — ABNORMAL HIGH (ref 0.8–1.2)
Prothrombin Time: 21.7 seconds — ABNORMAL HIGH (ref 11.4–15.2)

## 2019-03-12 LAB — CALCIUM, IONIZED: Calcium, Ionized, Serum: 3.2 mg/dL — ABNORMAL LOW (ref 4.5–5.6)

## 2019-03-12 SURGERY — LAPAROTOMY, EXPLORATORY
Anesthesia: General | Site: Abdomen

## 2019-03-12 MED ORDER — CALCIUM CHLORIDE 10 % IV SOLN
INTRAVENOUS | Status: DC | PRN
  Administered 2019-03-12 (×2): 200 mg via INTRAVENOUS
  Administered 2019-03-12 (×2): 300 mg via INTRAVENOUS

## 2019-03-12 MED ORDER — SODIUM CHLORIDE 0.9% IV SOLUTION: Freq: Once | INTRAVENOUS | Status: AC

## 2019-03-12 MED ORDER — CALCIUM CHLORIDE 10 % IV SOLN
INTRAVENOUS | Status: AC
  Administered 2019-03-12: 1 g via INTRAVENOUS
  Filled 2019-03-12: qty 10

## 2019-03-12 MED ORDER — ROCURONIUM BROMIDE 10 MG/ML (PF) SYRINGE
PREFILLED_SYRINGE | INTRAVENOUS | Status: DC | PRN
  Administered 2019-03-12: 50 mg via INTRAVENOUS

## 2019-03-12 MED ORDER — ALBUMIN HUMAN 5 % IV SOLN
INTRAVENOUS | Status: AC
  Administered 2019-03-12: 12.5 g
  Filled 2019-03-12: qty 500

## 2019-03-12 MED ORDER — VANCOMYCIN HCL 750 MG/150ML IV SOLN
750.0000 mg | INTRAVENOUS | Status: DC
  Filled 2019-03-12: qty 150

## 2019-03-12 MED ORDER — CALCIUM GLUCONATE-NACL 1-0.675 GM/50ML-% IV SOLN
1.0000 g | Freq: Once | INTRAVENOUS | Status: AC
  Administered 2019-03-12: 1000 mg via INTRAVENOUS
  Filled 2019-03-12: qty 50

## 2019-03-12 MED ORDER — ALBUMIN HUMAN 5 % IV SOLN
INTRAVENOUS | Status: AC
  Administered 2019-03-12: 12.5 g via INTRAVENOUS
  Filled 2019-03-12: qty 250

## 2019-03-12 MED ORDER — SODIUM BICARBONATE 8.4 % IV SOLN
150.0000 meq | Freq: Once | INTRAVENOUS | Status: AC
  Administered 2019-03-12: 15:00:00 150 meq via INTRAVENOUS

## 2019-03-12 MED ORDER — DEXTROSE 50 % IV SOLN: 50.0000 mL | Freq: Once | INTRAVENOUS | Status: AC

## 2019-03-12 MED ORDER — SODIUM CHLORIDE 0.9 % IV SOLN
2.0000 g | Freq: Two times a day (BID) | INTRAVENOUS | Status: DC
  Administered 2019-03-13: 2 g via INTRAVENOUS
  Filled 2019-03-12 (×3): qty 2

## 2019-03-12 MED ORDER — CALCIUM CHLORIDE 10 % IV SOLN: 1.0000 g | Freq: Once | INTRAVENOUS | Status: AC

## 2019-03-12 MED ORDER — CALCIUM CHLORIDE 10 % IV SOLN
1.0000 g | Freq: Once | INTRAVENOUS | Status: AC
  Administered 2019-03-12: 1 g via INTRAVENOUS

## 2019-03-12 MED ORDER — SODIUM BICARBONATE 8.4 % IV SOLN
INTRAVENOUS | Status: AC
  Administered 2019-03-12: 50 meq via INTRAVENOUS
  Filled 2019-03-12: qty 150

## 2019-03-12 MED ORDER — DEXTROSE 50 % IV SOLN
INTRAVENOUS | Status: AC
  Administered 2019-03-12: 50 mL via INTRAVENOUS
  Filled 2019-03-12: qty 50

## 2019-03-12 MED ORDER — SODIUM CHLORIDE 0.9% IV SOLUTION: Freq: Once | INTRAVENOUS | Status: DC

## 2019-03-12 MED ORDER — ALBUMIN HUMAN 5 % IV SOLN: 12.5000 g | Freq: Once | INTRAVENOUS | Status: AC

## 2019-03-12 MED ORDER — ALBUMIN HUMAN 5 % IV SOLN
12.5000 g | Freq: Once | INTRAVENOUS | Status: AC
  Administered 2019-03-12: 12.5 g via INTRAVENOUS
  Filled 2019-03-12: qty 250

## 2019-03-12 MED ORDER — 0.9 % SODIUM CHLORIDE (POUR BTL) OPTIME
TOPICAL | Status: DC | PRN
  Administered 2019-03-12: 6000 mL

## 2019-03-12 MED ORDER — SODIUM BICARBONATE 8.4 % IV SOLN
50.0000 meq | Freq: Once | INTRAVENOUS | Status: AC
  Filled 2019-03-12: qty 100

## 2019-03-12 MED ORDER — LACTATED RINGERS IV SOLN: INTRAVENOUS | Status: DC | PRN

## 2019-03-12 MED ORDER — CALCIUM CHLORIDE 10 % IV SOLN
INTRAVENOUS | Status: AC
  Filled 2019-03-12: qty 10

## 2019-03-12 SURGICAL SUPPLY — 41 items
BLADE CLIPPER SURG (BLADE) IMPLANT
BNDG GAUZE ELAST 4 BULKY (GAUZE/BANDAGES/DRESSINGS) ×2 IMPLANT
CANISTER SUCT 3000ML PPV (MISCELLANEOUS) ×3 IMPLANT
COVER SURGICAL LIGHT HANDLE (MISCELLANEOUS) ×3 IMPLANT
COVER WAND RF STERILE (DRAPES) ×3 IMPLANT
DRAPE LAPAROSCOPIC ABDOMINAL (DRAPES) ×3 IMPLANT
DRAPE WARM FLUID 44X44 (DRAPES) ×3 IMPLANT
DRSG OPSITE POSTOP 4X10 (GAUZE/BANDAGES/DRESSINGS) IMPLANT
DRSG OPSITE POSTOP 4X8 (GAUZE/BANDAGES/DRESSINGS) IMPLANT
ELECT BLADE 6.5 EXT (BLADE) IMPLANT
ELECT CAUTERY BLADE 6.4 (BLADE) ×3 IMPLANT
ELECT REM PT RETURN 9FT ADLT (ELECTROSURGICAL) ×3
ELECTRODE REM PT RTRN 9FT ADLT (ELECTROSURGICAL) ×1 IMPLANT
GLOVE SURG SIGNA 7.5 PF LTX (GLOVE) ×3 IMPLANT
GOWN STRL REUS W/ TWL LRG LVL3 (GOWN DISPOSABLE) ×1 IMPLANT
GOWN STRL REUS W/ TWL XL LVL3 (GOWN DISPOSABLE) ×1 IMPLANT
GOWN STRL REUS W/TWL LRG LVL3 (GOWN DISPOSABLE) ×2
GOWN STRL REUS W/TWL XL LVL3 (GOWN DISPOSABLE) ×2
HANDLE SUCTION POOLE (INSTRUMENTS) ×1 IMPLANT
KIT BASIN OR (CUSTOM PROCEDURE TRAY) ×3 IMPLANT
KIT OSTOMY DRAINABLE 2.75 STR (WOUND CARE) ×2 IMPLANT
KIT TURNOVER KIT B (KITS) ×3 IMPLANT
LIGASURE IMPACT 36 18CM CVD LR (INSTRUMENTS) ×2 IMPLANT
NS IRRIG 1000ML POUR BTL (IV SOLUTION) ×6 IMPLANT
PACK GENERAL/GYN (CUSTOM PROCEDURE TRAY) ×3 IMPLANT
PAD ARMBOARD 7.5X6 YLW CONV (MISCELLANEOUS) ×3 IMPLANT
PENCIL SMOKE EVACUATOR (MISCELLANEOUS) ×3 IMPLANT
SPECIMEN JAR LARGE (MISCELLANEOUS) IMPLANT
SPONGE LAP 18X18 RF (DISPOSABLE) ×4 IMPLANT
STAPLER VISISTAT 35W (STAPLE) ×1 IMPLANT
SUCTION POOLE HANDLE (INSTRUMENTS) ×3
SUT PDS AB 1 TP1 96 (SUTURE) ×6 IMPLANT
SUT SILK 2 0 SH CR/8 (SUTURE) ×3 IMPLANT
SUT SILK 2 0 TIES 10X30 (SUTURE) ×3 IMPLANT
SUT SILK 3 0 SH CR/8 (SUTURE) ×3 IMPLANT
SUT SILK 3 0 TIES 10X30 (SUTURE) ×3 IMPLANT
SUT VIC AB 3-0 SH 18 (SUTURE) ×2 IMPLANT
TOWEL GREEN STERILE (TOWEL DISPOSABLE) ×3 IMPLANT
TOWEL GREEN STERILE FF (TOWEL DISPOSABLE) ×3 IMPLANT
TRAY FOLEY MTR SLVR 16FR STAT (SET/KITS/TRAYS/PACK) IMPLANT
YANKAUER SUCT BULB TIP NO VENT (SUCTIONS) IMPLANT

## 2019-03-12 NOTE — Consult Note (Addendum)
Consult requested for new ileostomy. Pt had ileostomy surgery performed today and they are currently in the perioperative setting.  [facility] team will follow on Monday to demonstrate pouch change process and begin teaching sessions. Julien Girt MSN, RN, [place], [place], [place]

## 2019-03-12 NOTE — Progress Notes (Signed)
  Met with patient daughter and son in person. With patient multisystem organ failure , high dose pressors patient family has decided to make DNR, - no shock or cpr. For now will continue with current vent and pressors antibiotics.  I have entered order to concur with this request .  Grace Isaac MD      [address] [place] [address] Office [phone]

## 2019-03-12 NOTE — Op Note (Signed)
EXPLORATORY LAPAROTOMY WITH CLOSURE OF ABDOMEN, ILEOSTOMY  Procedure Note  Robert [name]. 01/30/2019 - 03/24/2019   Pre-op Diagnosis: Open Abdomen, s/p subtotal colectomy     Post-op Diagnosis: same  Procedure(s): EXPLORATORY LAPAROTOMY WITH CLOSURE OF ABDOMEN ILEOSTOMY  Surgeon(s): Coralie Keens, MD  Anesthesia: General  Staff:  Circulator: Rosanne Sack, RN Physician Assistant: Vivia Ewing, PA-C Scrub Person: Brien Mates, RN Circulator Assistant: Cyd Silence, RN  Estimated Blood Loss: Minimal               Findings: There was no evidence of further bowel ischemia.  The small bowel appeared viable.  There was old hematoma in the abdomen which was washed out.  There was no active bleeding.  Procedure: The patient was brought intubated from the intensive care unit to the operating room.  He is placed upon the operating table and general anesthesia was induced.  We then removed outer portion of the wound VAC.  His abdomen was then prepped and draped in usual sterile fashion.  We then removed the rest of the wound VAC itself from the abdominal cavity.  The small bowel was eviscerated.  There was a moderate amount of hematoma in the left lower quadrant and pelvis.  This was evacuated along with old blood.  We then irrigated the abdomen with multiple liters of normal saline.  No active bleeding was identified.  We then ran the small bowel from the ligament of Treitz to the resected segment.  The small bowel appeared viable throughout.  At this point we made a circular incision in the patient's right lower quadrant.  We took this down to the fascia which was then opened in a cruciate fashion.  The underlying muscle fibers were retracted the peritoneum was opened.  We took down some mesentery of the distalmost small bowel at the resection site with the LigaSure.  We then pulled the small bowel of the separate incision as an ileostomy.  The  patient's midline fascia was then closed with a running #1 looped PDS suture.  Pulmonary pressures.  Stay the same after closure.  We then resected the distal end of the ileum and then matured the ileostomy circumferentially with interrupted 3-0 Vicryl sutures.  The ileostomy appeared pink and well perfused.  The midline wound was then packed with a wet-to-dry saline soaked Kerlix.  Dry gauze was then placed over top of this with ABDs.  The patient appeared to tolerate the procedure.  All counts were correct at the end of the procedure.  He was then taken, still intubated, back to the intensive care unit.          Coralie Keens   Date: 03/03/2019  Time: 11:40 AM

## 2019-03-12 NOTE — Progress Notes (Signed)
Patient ID: Robert [name]., male   DOB: 12-Jun-1935, 84 y.o.   MRN: [id number] EVENING ROUNDS NOTE :     [address]       ,[place] [address]             [phone]                 Day of Surgery Procedure(s) (LRB): EXPLORATORY LAPAROTOMY WITH CLOSURE OF ABDOMEN (N/A) COLOSTOMY (N/A)  Total Length of Stay:  LOS: 17 days  BP (!) 85/56 (BP Location: Right Arm)   Pulse (!) 114   Temp (!) 95.5 F (35.3 C)   Resp (!) 0   Ht 5\' 8"  (1.727 m)   Wt 97.2 kg   SpO2 99%   BMI 32.58 kg/m   .Intake/Output      01/14 0701 - 01/15 0700 01/15 0701 - 01/16 0700   I.V. (mL/kg) 8069.7 (108.2) 2293.9 (23.6)   Blood 2201 899.1   NG/GT 0    IV Piggyback 1099.5 587.4   Total Intake(mL/kg) 11370.2 (152.4) 3780.4 (38.9)   Emesis/NG output 0 50   Drains 1600 110   Other 5104 1521   Blood  50   Chest Tube     Total Output 6704 1731   Net +4666.2 +2049.4          . sodium chloride 20 mL/hr at 03/22/2019 1730  . amiodarone 30 mg/hr (02/27/2019 1600)  . anidulafungin Stopped (03/14/2019 0057)  . [START ON 04/01/19] ceFEPime (MAXIPIME) IV    . dexmedetomidine (PRECEDEX) IV infusion 0.5 mcg/kg/hr (03/14/2019 1541)  . epinephrine 20 mcg/min (03/21/2019 1643)  . fentaNYL infusion INTRAVENOUS 125 mcg/hr (02/28/2019 1100)  . metronidazole 500 mg (03/20/2019 1634)  . norepinephrine (LEVOPHED) Adult infusion 50 mcg/min (03/24/2019 1642)  . phenylephrine (NEO-SYNEPHRINE) Adult infusion 400 mcg/min (03/14/2019 1600)  . prismasol BGK 4/2.5 1,700 mL/hr at 03/17/2019 1518  . sodium bicarbonate 150 mEq in dextrose 5% 1000 mL 100 mL/hr at 03/11/2019 1600  . sodium bicarbonate (isotonic) 1000 mL infusion 125 mL/hr at 03/02/2019 1234  . sodium bicarbonate (isotonic) 1000 mL infusion 125 mL/hr at 03/07/2019 1234  . [START ON 04/01/2019] vancomycin    . vasopressin (PITRESSIN) infusion - *FOR SHOCK* 0.04 Units/min (03/10/2019 1600)     Lab Results  Component Value Date   WBC 20.0 (H) 03/20/2019   HGB 6.0  (LL) 02/26/2019   HCT 19.1 (L) 03/24/2019   PLT 36 (L) 03/18/2019   GLUCOSE 104 (H) 03/17/2019   CHOL 112 01/28/2019   TRIG 157 (H) 02/16/2019   HDL 29 (L) 02/17/2019   LDLCALC 52 02/08/2019   ALT 1,338 (H) 03/03/2019   AST 6,571 (H) 02/28/2019   NA 149 (H) 03/10/2019   K 4.8 02/26/2019   CL 93 (L) 03/26/2019   CREATININE 2.02 (H) 03/23/2019   BUN 20 03/03/2019   CO2 22 03/16/2019   TSH 3.084 01/27/2019   INR 3.0 (H) 03/03/2019   HGBA1C 7.2 (H) 02/14/2019   Remains critically ill, on high dose pressors, positive blood cultures for Sadie Haber MD  Beeper [phone] Office [phone] 03/12/2019 4:46 PM

## 2019-03-12 NOTE — Progress Notes (Signed)
Patient ID: Robert Sarks., male   DOB: 1935/07/12, 84 y.o.   MRN: [id number] EVENING ROUNDS NOTE :     [address]       [place] [address]             [phone]                 Day of Surgery Procedure(s) (LRB): EXPLORATORY LAPAROTOMY WITH CLOSURE OF ABDOMEN (N/A) COLOSTOMY (N/A)  Total Length of Stay:  LOS: 17 days  BP (!) 85/56 (BP Location: Right Arm)   Pulse (!) 107   Temp (!) 95.7 F (35.4 C) (Esophageal)   Resp 17   Ht 5\' 8"  (1.727 m)   Wt 97.2 kg   SpO2 100%   BMI 32.58 kg/m   .Intake/Output      01/14 0701 - 01/15 0700 01/15 0701 - 01/16 0700   I.V. (mL/kg) 8069.7 (108.2) 2610.7 (26.9)   Blood 2201 1215.5   NG/GT 0    IV Piggyback 1099.5 629.3   Total Intake(mL/kg) 11370.2 (152.4) 4455.4 (45.8)   Emesis/NG output 0 50   Drains 1600 110   Other 5104 1521   Blood  50   Chest Tube     Total Output 6704 1731   Net +4666.2 +2724.4          . sodium chloride 20 mL/hr at 03/23/2019 1730  . amiodarone 30 mg/hr (03/21/2019 1700)  . anidulafungin Stopped (03/07/2019 0057)  . [START ON 05-Apr-2019] ceFEPime (MAXIPIME) IV    . dexmedetomidine (PRECEDEX) IV infusion 0.5 mcg/kg/hr (03/17/2019 1541)  . epinephrine 20 mcg/min (03/20/2019 1700)  . fentaNYL infusion INTRAVENOUS 125 mcg/hr (03/01/2019 1100)  . metronidazole 100 mL/hr at 02/27/2019 1700  . norepinephrine (LEVOPHED) Adult infusion 50 mcg/min (03/05/2019 1700)  . phenylephrine (NEO-SYNEPHRINE) Adult infusion 400 mcg/min (03/22/2019 1700)  . prismasol BGK 4/2.5 1,700 mL/hr at 03/01/2019 1518  . sodium bicarbonate 150 mEq in dextrose 5% 1000 mL 100 mL/hr at 03/05/2019 1700  . sodium bicarbonate (isotonic) 1000 mL infusion 125 mL/hr at 03/05/2019 1234  . sodium bicarbonate (isotonic) 1000 mL infusion 125 mL/hr at 03/11/2019 1234  . [START ON 2019/04/05] vancomycin    . vasopressin (PITRESSIN) infusion - *FOR SHOCK* 0.04 Units/min (03/03/2019 1700)     Lab Results  Component Value Date   WBC 20.0 (H)  03/16/2019   HGB 6.0 (LL) 03/03/2019   HCT 19.1 (L) 03/03/2019   PLT 36 (L) 03/20/2019   GLUCOSE 104 (H) 03/21/2019   CHOL 112 01/26/2019   TRIG 157 (H) 02/23/2019   HDL 29 (L) 02/19/2019   LDLCALC 52 01/27/2019   ALT 1,338 (H) 03/07/2019   AST 6,571 (H) 03/18/2019   NA 149 (H) 03/09/2019   K 4.8 03/15/2019   CL 93 (L) 03/10/2019   CREATININE 2.02 (H) 03/22/2019   BUN 20 03/23/2019   CO2 22 03/15/2019   TSH 3.084 02/12/2019   INR 3.0 (H) 03/11/2019   HGBA1C 7.2 (H) 02/02/2019   Back from surgery today  Low plts, hgb 6.0 now getting prbs, plts ffp Abdomen not excessive tight and miniumal blood ostomy bag Patient status discussed with daughter including low likelihood of survival in  current situation  Grace Isaac MD  Beeper [phone] Office [phone] 03/12/2019 5:03 PM

## 2019-03-12 NOTE — Progress Notes (Signed)
[facility] Progress Note Patient Name: Robert [name]. DOB: [birth year] MRN: [id number]   Date of Service  03/11/2019  HPI/Events of Note  Lactate 5.0  eICU Interventions  Albumin 5 % 250 ml iv x 1        Tyleah Loh U Nyasiah Moffet 03/26/2019, 7:00 AM

## 2019-03-12 NOTE — Anesthesia Postprocedure Evaluation (Signed)
Anesthesia Post Note  Patient: Robert [name].  Procedure(s) Performed: EXPLORATORY LAPAROTOMY WITH CLOSURE OF ABDOMEN (N/A Abdomen) COLOSTOMY (N/A Abdomen)     Patient location during evaluation: PACU Anesthesia Type: General Level of consciousness: awake and alert Pain management: pain level controlled Vital Signs Assessment: post-procedure vital signs reviewed and stable Respiratory status: spontaneous breathing, nonlabored ventilation, respiratory function stable and patient connected to nasal cannula oxygen Cardiovascular status: blood pressure returned to baseline and stable Postop Assessment: no apparent nausea or vomiting Anesthetic complications: no    Last Vitals:  Vitals:   03/10/2019 1345 03/08/2019 1447  BP:    Pulse:  (!) 110  Resp: (!) 24 (!) 0  Temp: (!) 35.5 C (!) 35.5 C  SpO2:  94%    Last Pain:  Vitals:   03/07/2019 1447  TempSrc: Esophageal  PainSc:                  Lulani Bour

## 2019-03-12 NOTE — Progress Notes (Signed)
[facility] Progress Note Patient Name: Robert [name]. DOB: 1935/12/06 MRN: [id number]   Date of Service  03/22/2019  HPI/Events of Note  Serum calcium 6.5, Albumin 2.1, last recorded lactate was at 7:30 a.m. yesterday morning.  eICU Interventions  Calcium gluconate 1 gm iv, check serum lactate.        Kerry Kass Imanie Darrow 03/22/2019, 6:04 AM

## 2019-03-12 NOTE — Plan of Care (Signed)
  Problem: Clinical Measurements: Goal: Ability to maintain clinical measurements within normal limits will improve Outcome: Progressing Goal: Cardiovascular complication will be avoided Outcome: Progressing   Problem: Activity: Goal: Risk for activity intolerance will decrease Outcome: Not Progressing   Problem: Elimination: Goal: Will not experience complications related to urinary retention Outcome: Not Progressing   Problem: Pain Managment: Goal: General experience of comfort will improve Outcome: Progressing   Problem: Safety: Goal: Ability to remain free from injury will improve Outcome: Progressing   Problem: Skin Integrity: Goal: Risk for impaired skin integrity will decrease Outcome: Not Progressing   Problem: Clinical Measurements: Goal: Postoperative complications will be avoided or minimized Outcome: Not Progressing

## 2019-03-12 NOTE — Anesthesia Preprocedure Evaluation (Signed)
Anesthesia Evaluation  Patient identified by MRN, date of birth, ID band Patient unresponsive    Reviewed: Allergy & Precautions, Patient's Chart, lab work & pertinent test resultsPreop documentation limited or incomplete due to emergent nature of procedure.  Airway Mallampati: Intubated       Dental   Pulmonary shortness of breath, former smoker,    breath sounds clear to auscultation       Cardiovascular hypertension, Pt. on medications and Pt. on home beta blockers + CAD, + Past MI, + Cardiac Stents and +CHF   Rhythm:Regular Rate:Normal  S/p CABG 13d ago   Neuro/Psych PSYCHIATRIC DISORDERS Depression  Neuromuscular disease CVA    GI/Hepatic Neg liver ROS, GERD  Medicated,Perforated bowel with subsequent sepsis- emergent exlap   Endo/Other  diabetes, Type 2, Insulin DependentHypothyroidism   Renal/GU ESRF and DialysisRenal disease  negative genitourinary   Musculoskeletal negative musculoskeletal ROS (+)   Abdominal Normal abdominal exam  (+)   Peds  Hematology  (+) Blood dyscrasia, anemia ,   Anesthesia Other Findings   Reproductive/Obstetrics negative OB ROS                             Lab Results  Component Value Date   WBC 18.4 (H) 03/03/2019   HGB 7.8 (L) 03/05/2019   HCT 23.0 (L) 03/14/2019   MCV 89.1 03/23/2019   PLT 65 (L) 03/02/2019   Lab Results  Component Value Date   CREATININE 2.28 (H) 03/28/2019   BUN 25 (H) 03/21/2019   NA 138 03/25/2019   K 4.0 03/14/2019   CL 91 (L) 03/01/2019   CO2 25 03/06/2019     Anesthesia Physical  Anesthesia Plan  ASA: IV  Anesthesia Plan: General   Post-op Pain Management:    Induction: Inhalational  PONV Risk Score and Plan: 3 and Midazolam and Treatment may vary due to age or medical condition  Airway Management Planned: Oral ETT  Additional Equipment: Arterial line  Intra-op Plan:   Post-operative Plan:  Post-operative intubation/ventilation  Informed Consent: I have reviewed the patients History and Physical, chart, labs and discussed the procedure including the risks, benefits and alternatives for the proposed anesthesia with the patient or authorized representative who has indicated his/her understanding and acceptance.     Dental advisory given  Plan Discussed with: CRNA, Surgeon and Anesthesiologist  Anesthesia Plan Comments:         Anesthesia Quick Evaluation

## 2019-03-12 NOTE — Progress Notes (Signed)
Robert [name]  Assessment/ Plan: Pt is a 84 y.o. yo male with HTN, CAD, stroke, DM, admitted with an NSTEMI and found to have three-vessel CAD status post CABG on 12/30.  Dialysis: [facility] 4h   400/800    73.5kg    3/2.25Ca bath  P4  Hep none L AVF Hectorol 4 Venofer 50 Mircera 75 (last 12/15)   - yest I/O +4 L net, today even  - wt's not reported since 1/11  - lytes ok, Ca down 6.3 > corrects to 7.9  - LFT"s rising ast/ alt 6571/ 1338, tbili 6  - lactic acid > 11  - abg 7.15 / 55/ 91, serum CO2 25 ^    Problems: # Sepsis/ shock - due to infarcted bowel. Shock persists on multiple pressors, IV abx and IV hydrocortisone  # Infarcted colon - sp subtotal colectomy 1/13, back for ileostomy and abd closure today 1/15  # Severe metabolic acidosis - getting bicarb pre/post w/ CRRT at 125/h  - lactic acid still > 11  # ESRD, HD per TTS schedule normally, changed to CRRT 1/10- 1/11 for hypotension, stopped temporarily then resumed 1/13 for septic shock.    - cont CRRT w/ bicarb pre and post  - pull fluids as BP will tolerate  #CAD/NSTEMI: LHC with severe multivessel CAD status post CABG  # Acute hypoxic resp failure - on vent, per primary  - CXR 1/15 clear on right, layering effusion L, no edema  # Anemia due to CKD: Received PRBC postop.  Aranesp weekly on Tuesdays - [name] was started 1/5 here   # Secondary hyperparathyroidism: Started hectorol.  Discontinue binders for now   #Paroxysmal A. fib   Robert [name] Robert [name] 03/08/2019,10:08 AM   Subjective:  Went back to OR today for ileostomy and closure of abdomen  Objective Vital signs in last 24 hours: Vitals:   03/14/2019 1015 03/24/2019 1030 03/16/2019 1045 03/18/2019 1100  BP:      Pulse: 60 (!) 59 62   Resp: (!) 22 (!) 22 (!) 24 (!) 28  Temp: (!) 96.4 F (35.8 C) (!) 96.4 F (35.8 C) (!) 96.4 F (35.8 C) (!) 96.8 F (36 C)  TempSrc:      SpO2: 90% 100% 100% 100%  Weight:       Height:       Physical Exam:   General: on vent, sedated, multiple drips Heart:s1s2 irregular Chest: clear R, dec'd L base Abdomen:soft, Non-tender, non-distended Extremities: 1-2+ diffuse edema Neuro sedated on the vent Dialysis Access: L AVF, L groin temp HD cath  Medications: Infusions: . sodium chloride 20 mL/hr at 03/11/2019 1730  . amiodarone 30 mg/hr (03/03/2019 1100)  . anidulafungin Stopped (03/28/2019 0057)  . ceFEPime (MAXIPIME) IV Stopped (03/27/2019 0900)  . dexmedetomidine (PRECEDEX) IV infusion 0.5 mcg/kg/hr (03/24/2019 1541)  . epinephrine 15 mcg/min (03/05/2019 1247)  . fentaNYL infusion INTRAVENOUS 125 mcg/hr (03/11/2019 1100)  . metronidazole Stopped (03/24/2019 0733)  . norepinephrine (LEVOPHED) Adult infusion 50 mcg/min (03/09/2019 1121)  . phenylephrine (NEO-SYNEPHRINE) Adult infusion 250 mcg/min (03/25/2019 1200)  . prismasol BGK 4/2.5 1,700 mL/hr at 03/25/2019 0916  . sodium bicarbonate 150 mEq in dextrose 5% 1000 mL 150 mEq (03/23/2019 1248)  . sodium bicarbonate (isotonic) 1000 mL infusion 125 mL/hr at 03/03/2019 1234  . sodium bicarbonate (isotonic) 1000 mL infusion 125 mL/hr at 03/02/2019 1234  . vancomycin Stopped (03/24/2019 1004)  . vasopressin (PITRESSIN) infusion - *FOR SHOCK* 0.04 Units/min (03/26/2019 1100)  Scheduled Medications: . chlorhexidine gluconate (MEDLINE KIT)  15 mL Mouth Rinse BID  . Chlorhexidine Gluconate Cloth  6 each Topical Daily  . darbepoetin (ARANESP) injection - DIALYSIS  60 mcg Subcutaneous Q Tue  . doxercalciferol  4 mcg Intravenous Once per day on Tue Thu Sat  . feeding supplement (PRO-STAT SUGAR FREE 64)  30 mL Per Tube BID  . hydrocortisone sod succinate (SOLU-CORTEF) inj  50 mg Intravenous Q6H  . levothyroxine  25 mcg Intravenous Daily  . mouth rinse  15 mL Mouth Rinse 10 times per day  . pantoprazole (PROTONIX) IV  40 mg Intravenous Q24H  . sodium chloride flush  10-40 mL Intracatheter Q12H  . sodium chloride flush  3 mL Intravenous Q12H     have reviewed scheduled and prn medications.

## 2019-03-12 NOTE — Progress Notes (Signed)
2 Days Post-Op Procedure(s) (LRB): EXPLORATORY LAPAROTOMY (N/A) SUBTOTAL COLECTOMY (N/A) APPLICATION OF WOUND VAC (N/A) Subjective: Intubated, sedated  Objective: Vital signs in last 24 hours: Temp:  [95.4 F (35.2 C)-98.2 F (36.8 C)] 96.8 F (36 C) (01/15 0730) Pulse Rate:  [41-77] 64 (01/15 0730) Cardiac Rhythm: Normal sinus rhythm (01/15 0400) Resp:  [20-22] 22 (01/15 0730) BP: (99-123)/(35-54) 101/54 (01/15 0700) SpO2:  [90 %-100 %] 100 % (01/15 0730) FiO2 (%):  [100 %] 100 % (01/15 0400)  Hemodynamic parameters for last 24 hours: CVP:  [15 mmHg] 15 mmHg  Intake/Output from previous day: 01/14 0701 - 01/15 0700 In: 11370.2 [I.V.:8069.7; Blood:2201; IV Piggyback:1099.5] Out: 5361 [Drains:1600] Intake/Output this shift: Total I/O In: 457.4 [I.V.:130.2; IV Piggyback:327.3] Out: -   General appearance: intubated Neurologic: sedated Heart: regular rate and rhythm Lungs: clear anteriorly Abdomen: VAC in place Extremities: mildly cyanotic, warm, + doppler pulses  Lab Results: Recent Labs    03/11/19 2356 03/11/19 2356 03/24/2019 0325 03/06/2019 0457  WBC 16.7*  --  18.4*  --   HGB 8.1*   < > 8.6* 7.8*  HCT 24.8*   < > 25.4* 23.0*  PLT 79*  --  65*  --    < > = values in this interval not displayed.   BMET:  Recent Labs    03/11/19 1555 03/11/19 1601 03/18/2019 0325 03/03/2019 0457  NA 142   < > 139 138  K 4.0   < > 4.2 4.0  CL 91*  --  91*  --   CO2 23  --  25  --   GLUCOSE 224*  --  76  --   BUN 32*  --  25*  --   CREATININE 2.77*  --  2.28*  --   CALCIUM 6.8*  --  6.3*  --    < > = values in this interval not displayed.    PT/INR:  Recent Labs    03/11/19 2356  LABPROT 21.7*  INR 1.9*   ABG    Component Value Date/Time   PHART 7.476 (H) 03/19/2019 0457   HCO3 28.4 (H) 03/07/2019 0457   TCO2 30 03/07/2019 0457   ACIDBASEDEF 4.0 (H) 03/11/2019 1202   O2SAT 100.0 03/03/2019 0457   CBG (last 3)  Recent Labs    03/06/2019 0456 03/01/2019 0621  03/22/2019 0644  GLUCAP 72 65* 146*    Assessment/Plan: S/P Procedure(s) (LRB): EXPLORATORY LAPAROTOMY (N/A) SUBTOTAL COLECTOMY (N/A) APPLICATION OF WOUND VAC (N/A) Remains critically ill  CV- maintaining good cardiac index  Still on multiple pressors to support BP in setting of sepsis  Has been able to wean a little, but still on high doses  In SR on amiodarone RESP_ VDRF- increased FiO2 overnight, mild alkalosis RENAL- On CVVHD ENDO- CBG low- insulin drip off ID_ sepsis secondary to infarcted colon, on broad spectrum antibiotics  Lactic acid elevated at 11  For relook lap today   LOS: 17 days    Melrose Nakayama 03/09/2019

## 2019-03-12 NOTE — Progress Notes (Signed)
Patient ID: Robert Sarks., male   DOB: Dec 16, 1935, 84 y.o.   MRN: [id number]   Plan second look laparotomy today, possible creation of ileostomy and fascial closure.

## 2019-03-12 NOTE — Anesthesia Procedure Notes (Signed)
Date/Time: 03/16/2019 11:02 AM Performed by: Harden Mo, CRNA Pre-anesthesia Checklist: Patient identified, Emergency Drugs available, Suction available and Patient being monitored Patient Re-evaluated:Patient Re-evaluated prior to induction Oxygen Delivery Method: Circle system utilized Preoxygenation: Pre-oxygenation with 100% oxygen Induction Type: Inhalational induction with existing ETT Placement Confirmation: positive ETCO2 and breath sounds checked- equal and bilateral Dental Injury: Teeth and Oropharynx as per pre-operative assessment

## 2019-03-12 NOTE — Consult Note (Signed)
PULMONARY / CRITICAL CARE MEDICINE   NAME:  Robert [name]., MRN:  [id number], DOB:  1935/07/10, LOS: 51 ADMISSION DATE:  02/13/2019, CONSULTATION DATE:  02/27/2019 REFERRING MD: Benedetto Coons ,MD CHIEF COMPLAINT:  Acute hypoxic respiratory failure BRIEF HISTORY:    84 yo male presented with tachycardia from A fib with RVR and elevated troponin.  S/p CABG with atrial appendage clipping.  Developed ischemic bowel and required emergent laparotomy, and aspiration pneumonitis with hypoxia.  SIGNIFICANT PAST MEDICAL HISTORY   Allergy - year round Cancer [facility])     Comment:  PSA ELEVATED ESRD (end stage renal disease) ([place])     Comment:  [place] GERD (gastroesophageal reflux disease) Hearing deficit  History of kidney stones Hyperlipidemia Hypertension Hypothyroidism 2011: Myocardial infarction ([place]) No date: Stroke (cerebrum) ([place])     Comment:  weakness in left hand  04/2015, 05/20/16 No date: Thyroid disease     Comment:  hypothyroid  SIGNIFICANT EVENTS:  12/30 3 Vessel CABG - SVC to Diag1; SVC to OM1; SVC to PDA and LIMA to LAD. (N/A) Clipping Of Atrial Appendage using 45 MM AtriCure AtriClip (N/A)  1/13 - Emergent surgery for ischemic colon   STUDIES:     CULTURES:  12/28 Sars Coronavirus 2 : Negative 12/30 MRSA nasopharyngeal : Negative 1/13 Blood cultures: Pending results 1/13 Sputum cultures: Gram positive rods on gram stain  ANTIBIOTICS:  Cefepime 1/12 >> Vancomycin 1/13 >> Flagyl 1/13 >> Anidulagungin 1/13 >>   LINES/TUBES:  1/10 Rt Weiner CVL >> 1/13 ETT >>  CONSULTANTS:  Nephrology CTS Gen Surg  SUBJECTIVE:  Remains on pressors, vent, CRRT, sedation.  OBJECTIVE:   CONSTITUTIONAL: BP (!) 102/47 (BP Location: Right Arm)   Pulse 64   Temp (!) 96.6 F (35.9 C)   Resp (!) 22   Ht 5\' 8"  (1.727 m)   Wt 74.6 kg Comment: Informed Dr. Jonnie Finner  SpO2 100%   BMI 25.01 kg/m   I/O last 3 completed shifts: In: 18602.7 [I.V.:13188.8;  LKGMW:1027; NG/GT:50; IV Piggyback:1626] Out: 2536 [Drains:2500; UYQIH:4742]  CVP:  [15 mmHg] 15 mmHg  Vent Mode: PRVC FiO2 (%):  [100 %] 100 % Set Rate:  [22 bmp] 22 bmp Vt Set:  [650 mL] 650 mL PEEP:  [12 cmH20] 12 cmH20 Plateau Pressure:  [29 cmH20-31 cmH20] 29 cmH20  PHYSICAL EXAM:  General - ill appearing Eyes - pupils reactive ENT - ETT in place Cardiac - regular rate/rhythm, no murmur Chest - rhonchi Lt > Rt Abdomen - wound vac in place Extremities - 1+ edema Skin - bruise in Lt thigh Neuro - RASS -3  CXR (reviewed by me) - Lt sided ASD    RESOLVED PROBLEM LIST    ASSESSMENT AND PLAN    Acute hypoxic respiratory failure with aspiration pneumonitis. - full vent support - goal SpO2 > 90% - f/u CXR, ABG  Septic shock from aspiration pneumonitis, and ischemic colitis. GPC in blood culture >> ?contaminate. Lactic acidosis. Relative adrenal insufficiency. - pressors to keep MAP > 65 - continue ABx, antifungal - f/u blood culture ID - continue solu cortef while on pressors  Ischemic bowel s/p subtotal colectomy. - wound vac per surgery  CAD, A fib s/p CABG with atrial clipping. - per TCTS  ESRD. - CRRT  Elevated LFTs from shock. - f/u LFTs  Anemia of critical illness. Thrombocytopenia. - f/u CBC - transfuse for Hb < 7 or significant bleeding  Best Practice / Goals of Care / Disposition.   DVT  PROPHYLAXIS: SCDs SUP:  Protonix NUTRITION: NPO MOBILITY: Bed rest GOALS OF CARE: full code DISPOSITION: ICU  LABS:   CMP Latest Ref Rng & Units 03/16/2019 03/27/2019 03/28/2019  Glucose 70 - 99 mg/dL - - -  BUN 8 - 23 mg/dL - - -  Creatinine 0.61 - 1.24 mg/dL - - -  Sodium 135 - 145 mmol/L 137 - 138  Potassium 3.5 - 5.1 mmol/L 4.5 - 4.0  Chloride 98 - 111 mmol/L - - -  CO2 22 - 32 mmol/L - - -  Calcium 8.9 - 10.3 mg/dL - - -  Total Protein 6.5 - 8.1 g/dL - 4.2(L) -  Total Bilirubin 0.3 - 1.2 mg/dL - 10.0(H) -  Alkaline Phos 38 - 126 U/L - 345(H)  -  AST 15 - 41 U/L - 6,571(H) -  ALT 0 - 44 U/L - 1,338(H) -    CBC Latest Ref Rng & Units 03/26/2019 03/16/2019 03/08/2019  WBC 4.0 - 10.5 K/uL - - 18.4(H)  Hemoglobin 13.0 - 17.0 g/dL 7.5(L) 7.8(L) 8.6(L)  Hematocrit 39.0 - 52.0 % 22.0(L) 23.0(L) 25.4(L)  Platelets 150 - 400 K/uL - - 65(L)    ABG    Component Value Date/Time   PHART 7.298 (L) 03/21/2019 1027   PCO2ART 39.2 03/16/2019 1027   PO2ART 101.0 03/19/2019 1027   HCO3 19.5 (L) 03/14/2019 1027   TCO2 21 (L) 03/11/2019 1027   ACIDBASEDEF 7.0 (H) 03/19/2019 1027   O2SAT 98.0 03/19/2019 1027    CBG (last 3)  Recent Labs    03/03/2019 0749 03/28/2019 0833 03/25/2019 0938  GLUCAP 88 77 93    CC time 33 minutes  Chesley Mires, MD [place] 03/12/2019, 10:36 AM

## 2019-03-12 NOTE — Progress Notes (Signed)
ABG obtained @2037  . Results called to Dr. Servando Snare. D/t pt being on bicarb gtt & maxxed on all other pressors & meds, no new orders at this time.  RT to come in and go down on FiO2.

## 2019-03-12 NOTE — Progress Notes (Signed)
Progress Note  Patient Name: Robert [name]. Date of Encounter: 03/06/2019  Primary Cardiologist: Shirlee More, MD   Subjective   Patient sedated on Vent.   Inpatient Medications    Scheduled Meds: . aspirin EC  325 mg Oral Daily   Or  . aspirin  324 mg Per Tube Daily  . B-complex with vitamin C  1 tablet Per Tube Daily  . chlorhexidine gluconate (MEDLINE KIT)  15 mL Mouth Rinse BID  . Chlorhexidine Gluconate Cloth  6 each Topical Daily  . darbepoetin (ARANESP) injection - DIALYSIS  60 mcg Subcutaneous Q Tue  . doxercalciferol  4 mcg Intravenous Once per day on Tue Thu Sat  . feeding supplement (PRO-STAT SUGAR FREE 64)  30 mL Per Tube BID  . hydrocortisone sod succinate (SOLU-CORTEF) inj  50 mg Intravenous Q6H  . levothyroxine  25 mcg Intravenous Daily  . mouth rinse  15 mL Mouth Rinse 10 times per day  . pantoprazole (PROTONIX) IV  40 mg Intravenous Q24H  . rosuvastatin  40 mg Per Tube q1800  . sodium chloride flush  10-40 mL Intracatheter Q12H  . sodium chloride flush  3 mL Intravenous Q12H   Continuous Infusions: . sodium chloride 20 mL/hr at 03/07/2019 1730  . albumin human    . amiodarone 30 mg/hr (03/26/2019 0700)  . anidulafungin Stopped (03/02/2019 0057)  . calcium gluconate 50 mL/hr at 03/11/2019 0700  . ceFEPime (MAXIPIME) IV 2 g (03/11/19 2124)  . dexmedetomidine (PRECEDEX) IV infusion 0.5 mcg/kg/hr (03/03/2019 1541)  . epinephrine 5 mcg/min (03/15/2019 0700)  . fentaNYL infusion INTRAVENOUS 125 mcg/hr (03/09/2019 0700)  . insulin Stopped (03/07/2019 0219)  . metronidazole 100 mL/hr at 03/03/2019 0700  . norepinephrine (LEVOPHED) Adult infusion 35 mcg/min (03/22/2019 0700)  . phenylephrine (NEO-SYNEPHRINE) Adult infusion 230 mcg/min (03/19/2019 0730)  . prismasol BGK 4/2.5 1,700 mL/hr at 03/11/19 1736  . sodium bicarbonate 150 mEq in dextrose 5% 1000 mL 100 mL/hr at 03/11/19 1632  . sodium bicarbonate (isotonic) 1000 mL infusion 125 mL/hr at 03/11/19 1545  . sodium  bicarbonate (isotonic) 1000 mL infusion 125 mL/hr at 03/11/19 1440  . vancomycin    . vasopressin (PITRESSIN) infusion - *FOR SHOCK* 0.04 Units/min (02/28/2019 0700)   PRN Meds: albuterol, alteplase, budesonide (PULMICORT) nebulizer solution, dextrose, heparin, lidocaine (PF), metoprolol tartrate, midazolam, morphine injection, ondansetron (ZOFRAN) IV, sodium chloride, sodium chloride flush, sodium chloride flush   Vital Signs    Vitals:   03/07/2019 0630 03/19/2019 0645 03/22/2019 0700 03/16/2019 0715  BP:   (!) 101/54   Pulse: 66 66 65 65  Resp: (!) 22 (!) 22 (!) 22 (!) 22  Temp: (!) 97 F (36.1 C) (!) 97 F (36.1 C) (!) 96.8 F (36 C) (!) 96.8 F (36 C)  TempSrc:      SpO2: 100% 100% 100% 100%  Weight:      Height:        Intake/Output Summary (Last 24 hours) at 03/07/2019 0733 Last data filed at 03/27/2019 0700 Gross per 24 hour  Intake 11370.17 ml  Output 6704 ml  Net 4666.17 ml   Last 3 Weights 03/08/2019 03/08/2019 03/07/2019  Weight (lbs) 164 lb 7.4 oz 162 lb 4.1 oz 170 lb 13.7 oz  Weight (kg) 74.6 kg 73.6 kg 77.5 kg      Telemetry    NSR. No Afib in last 24 hours.  Personally Reviewed  Physical Exam   GEN: Elderly WM, intubated. unresponsive Neck: No JVD, supple. Dialysis  catheter in place, central line Cardiac: RRR, distant heart sounds.  Respiratory: clear GI: open abdominal wound.  Neuro: sedated on vent.   Labs    High Sensitivity Troponin:   Recent Labs  Lab 02/16/2019 1736 02/13/2019 1922 02/11/2019 2200 02/21/2019 0124 02/07/2019 0955  TROPONINIHS 81* 856* 4,043* 4,270* 4,018*      Chemistry Recent Labs  Lab 03/05/19 1300 03/06/19 1010 03/11/19 0228 03/11/19 0500 03/11/19 1555 03/11/19 1601 03/11/19 1945 03/08/2019 0325 03/17/2019 0457  NA  --    < > 145   < > 142   < > 140 139 138  K  --    < > 3.8   < > 4.0   < > 3.6 4.2 4.0  CL  --    < > 98  --  91*  --   --  91*  --   CO2  --    < > 15*  --  23  --   --  25  --   GLUCOSE  --    < > 280*  --  224*   --   --  76  --   BUN  --    < > 50*  --  32*  --   --  25*  --   CREATININE  --    < > 3.88*  --  2.77*  --   --  2.28*  --   CALCIUM  --    < > 6.4*  --  6.8*  --   --  6.3*  --   PROT 4.6*  --   --   --  4.2*  --   --   --   --   ALBUMIN 2.0*   < > 1.9*  --  2.0*  --   --  2.1*  --   AST 57*  --   --   --  8,966*  --   --   --   --   ALT 47*  --   --   --   --   --   --   --   --   ALKPHOS 75  --   --   --  285*  --   --   --   --   BILITOT 0.2*  --   --   --  8.5*  --   --   --   --   GFRNONAA  --    < > 14*  --  21*  --   --  26*  --   GFRAA  --    < > 16*  --  24*  --   --  30*  --   ANIONGAP  --    < > 32*  --  28*  --   --  23*  --    < > = values in this interval not displayed.     Hematology Recent Labs  Lab 03/11/19 1944 03/11/19 1945 03/11/19 2356 03/07/2019 0325 03/05/2019 0457  WBC 15.5*  --  16.7* 18.4*  --   RBC 2.51*  --  2.75* 2.85*  --   HGB 7.3*   < > 8.1* 8.6* 7.8*  HCT 22.8*   < > 24.8* 25.4* 23.0*  MCV 90.8  --  90.2 89.1  --   MCH 29.1  --  29.5 30.2  --   MCHC 32.0  --  32.7 33.9  --   RDW 18.3*  --  17.6* 17.6*  --   PLT 44*  --  79* 65*  --    < > = values in this interval not displayed.    Patient Profile     Vasco Chong. is a 84 y.o. male with ESRD, HTN, CAD s/p PCI, Stroke s/p CEA, DM admitted 12/29 for new-onset Afib. Subsequently had NSTEMI and found to have 3vCAD. S/p CABG x 4 01/30/2019.  Note preoperative echocardiogram showed normal LV function, mild mitral regurgitation, moderate aortic stenosis. On post op day 14 developed acute respiratory failure, septic shock and bowel infarction requiring total colectomy.   Assessment & Plan    1.  Acute septic shock secondary to acute bowel infarction. S/p emergent colectomy. Open Abdominal wound. Associated coagulopathy, shock liver,  and severe metabolic acidosis. On IV antibiotics with Cefipime. On multiple high dose pressors x 4. Acidosis is improved with lactate from > 11 to 5. Still on  vent support. Still critically ill with multiorgan failure.   2. Atrial fibrillation with RVR. Now in NSR on IV amiodarone.  Unable to give Cardizem or beta blocker due to shock. He did have LA clipping. [place] vasc score of 7. Not a candidate for anticoagulation due to coagulopathy.  3.  NSTEMI/ with severe 3 vessel coronary artery disease status post coronary artery bypass graft-02/21/2019. On ASA. Would hold statin in setting of shock liver. Can be resumed later if/when liver function recovers.   4.  Moderate aortic stenosis  5.  End-stage renal disease-dialysis per nephrology.  6.  Acute hypercarbic respiratory failure. On ventilator support. Per CCM.  7. Ischemic bowel s/p emergent colectomy  8. History of stroke status post carotid endarterectomy.  The patient is critically ill with multiple organ systems failure and requires high complexity decision making for assessment and support, frequent evaluation and titration of therapies, application of advanced monitoring technologies and extensive interpretation of multiple databases.   For questions or updates, please contact [place] Please consult [email or web address] for contact info under        Signed, Lyn Deemer [place], MD  03/23/2019, 7:33 AM

## 2019-03-12 NOTE — Transfer of Care (Signed)
Immediate Anesthesia Transfer of Care Note  Patient: Robert [name].  Procedure(s) Performed: EXPLORATORY LAPAROTOMY WITH CLOSURE OF ABDOMEN (N/A Abdomen) COLOSTOMY (N/A Abdomen)  Patient Location: ICU  Anesthesia Type:General  Level of Consciousness: sedated, unresponsive and Patient remains intubated per anesthesia plan  Airway & Oxygen Therapy: Patient remains intubated per anesthesia plan and Patient placed on Ventilator (see vital sign flow sheet for setting)  Post-op Assessment: Report given to RN and Post -op Vital signs reviewed and stable  Post vital signs: Reviewed and stable  Last Vitals:  Vitals Value Taken Time  BP    Temp    Pulse    Resp    SpO2      Last Pain:  Vitals:   03/16/2019 0800  TempSrc: Esophageal  PainSc:       Patients Stated Pain Goal: 0 (07/46/00 2984)  Complications: No apparent anesthesia complications

## 2019-03-12 NOTE — Progress Notes (Signed)
RT NOTE: RT reported ABG to Dr.Sood.  ABG as follows: pH 7.29, PCO2 39.2, PO2 101 and HCO3 19.5.  Per MD increase RR to 24.  RT will continue to monitor.

## 2019-03-12 NOTE — Progress Notes (Signed)
CRITICAL VALUE ALERT  Critical Value: Hgb 6.0  Date & Time Notied:  03/17/2019  Provider Notified: Roxan Hockey, MD  Orders Received/Actions taken: 3 units of prbc, repeat DIC panel

## 2019-03-12 NOTE — Progress Notes (Signed)
      [address]       [place] [address]             [phone]      S/p relook lap, ileostomy, abdominal closure  Hypotensive despite high dose pressors  Some bloody drainage at ostomy site  ABG- acidotic with base deficit of 12- will give 3 amps of bicarb  Anemic- Hgb 6- transfuse 3 units PRBC  Ionized calcium 0.83- calcium given  Remains critically ill  Will check DIC panel  Remo Lipps C. Roxan Hockey, MD [facility] [phone]

## 2019-03-12 NOTE — Progress Notes (Signed)
Hypoglycemic Event  CBG: 65  Treatment: 7ml of D50  Symptoms: UTA; pt sedated  Follow-up CBG: Time:0636 CBG Result:146  Possible Reasons for Event: NPO  Comments/MD notified: Will continue to monitor    [name]

## 2019-03-12 NOTE — Progress Notes (Signed)
CRITICAL VALUE ALERT  Critical Value:  Calcium 6.3  Date & Time Notied:  03/07/2019 0530  Provider Notified: Warren Lacy  Orders Received/Actions taken: 1g Calcium gluconate IV, check lactic acid

## 2019-03-13 ENCOUNTER — Inpatient Hospital Stay (HOSPITAL_COMMUNITY): Payer: Medicare Other

## 2019-03-13 DIAGNOSIS — I509 Heart failure, unspecified: Secondary | ICD-10-CM

## 2019-03-13 DIAGNOSIS — I110 Hypertensive heart disease with heart failure: Secondary | ICD-10-CM

## 2019-03-13 DIAGNOSIS — I350 Nonrheumatic aortic (valve) stenosis: Secondary | ICD-10-CM

## 2019-03-13 DIAGNOSIS — I25110 Atherosclerotic heart disease of native coronary artery with unstable angina pectoris: Secondary | ICD-10-CM

## 2019-03-13 DIAGNOSIS — E119 Type 2 diabetes mellitus without complications: Secondary | ICD-10-CM

## 2019-03-13 DIAGNOSIS — Z794 Long term (current) use of insulin: Secondary | ICD-10-CM

## 2019-03-13 DIAGNOSIS — L8990 Pressure ulcer of unspecified site, unspecified stage: Secondary | ICD-10-CM | POA: Insufficient documentation

## 2019-03-13 LAB — PREPARE FRESH FROZEN PLASMA
Unit division: 0
Unit division: 0
Unit division: 0
Unit division: 0

## 2019-03-13 LAB — POCT I-STAT 7, (LYTES, BLD GAS, ICA,H+H)
Acid-base deficit: 16 mmol/L — ABNORMAL HIGH (ref 0.0–2.0)
Bicarbonate: 12.7 mmol/L — ABNORMAL LOW (ref 20.0–28.0)
Calcium, Ion: 0.82 mmol/L — CL (ref 1.15–1.40)
HCT: 23 % — ABNORMAL LOW (ref 39.0–52.0)
Hemoglobin: 7.8 g/dL — ABNORMAL LOW (ref 13.0–17.0)
O2 Saturation: 86 %
Patient temperature: 36.3
Potassium: 5.2 mmol/L — ABNORMAL HIGH (ref 3.5–5.1)
Sodium: 131 mmol/L — ABNORMAL LOW (ref 135–145)
TCO2: 14 mmol/L — ABNORMAL LOW (ref 22–32)
pCO2 arterial: 40.2 mmHg (ref 32.0–48.0)
pH, Arterial: 7.104 — CL (ref 7.350–7.450)
pO2, Arterial: 65 mmHg — ABNORMAL LOW (ref 83.0–108.0)

## 2019-03-13 LAB — RENAL FUNCTION PANEL
Albumin: 2.1 g/dL — ABNORMAL LOW (ref 3.5–5.0)
Anion gap: 37 — ABNORMAL HIGH (ref 5–15)
BUN: 16 mg/dL (ref 8–23)
CO2: 11 mmol/L — ABNORMAL LOW (ref 22–32)
Calcium: 6.5 mg/dL — ABNORMAL LOW (ref 8.9–10.3)
Chloride: 89 mmol/L — ABNORMAL LOW (ref 98–111)
Creatinine, Ser: 1.5 mg/dL — ABNORMAL HIGH (ref 0.61–1.24)
GFR calc Af Amer: 50 mL/min — ABNORMAL LOW (ref >60)
GFR calc non Af Amer: 43 mL/min — ABNORMAL LOW (ref >60)
Glucose, Bld: 62 mg/dL — ABNORMAL LOW (ref 70–99)
Phosphorus: 7.4 mg/dL — ABNORMAL HIGH (ref 2.5–4.6)
Potassium: 5.5 mmol/L — ABNORMAL HIGH (ref 3.5–5.1)
Sodium: 137 mmol/L (ref 135–145)

## 2019-03-13 LAB — CBC
HCT: 28.2 % — ABNORMAL LOW (ref 39.0–52.0)
Hemoglobin: 8.9 g/dL — ABNORMAL LOW (ref 13.0–17.0)
MCH: 31 pg (ref 26.0–34.0)
MCHC: 31.6 g/dL (ref 30.0–36.0)
MCV: 98.3 fL (ref 80.0–100.0)
Platelets: 51 10*3/uL — ABNORMAL LOW (ref 150–400)
RBC: 2.87 MIL/uL — ABNORMAL LOW (ref 4.22–5.81)
RDW: 17.9 % — ABNORMAL HIGH (ref 11.5–15.5)
WBC: 16.6 10*3/uL — ABNORMAL HIGH (ref 4.0–10.5)
nRBC: 42 % — ABNORMAL HIGH (ref 0.0–0.2)

## 2019-03-13 LAB — PREPARE PLATELET PHERESIS
Unit division: 0
Unit division: 0

## 2019-03-13 LAB — MAGNESIUM: Magnesium: 2.3 mg/dL (ref 1.7–2.4)

## 2019-03-13 LAB — CULTURE, BLOOD (ROUTINE X 2): Special Requests: ADEQUATE

## 2019-03-13 LAB — BPAM PLATELET PHERESIS
Blood Product Expiration Date: 202101152359
Blood Product Expiration Date: 202101162359
ISSUE DATE / TIME: 202101151654
ISSUE DATE / TIME: 202101151654
Unit Type and Rh: 5100
Unit Type and Rh: 6200

## 2019-03-13 LAB — HEPATIC FUNCTION PANEL
ALT: 1029 U/L — ABNORMAL HIGH (ref 0–44)
AST: 5613 U/L — ABNORMAL HIGH (ref 15–41)
Albumin: 2.1 g/dL — ABNORMAL LOW (ref 3.5–5.0)
Alkaline Phosphatase: 466 U/L — ABNORMAL HIGH (ref 38–126)
Bilirubin, Direct: 6.8 mg/dL — ABNORMAL HIGH (ref 0.0–0.2)
Indirect Bilirubin: 4.3 mg/dL — ABNORMAL HIGH (ref 0.3–0.9)
Total Bilirubin: 11.1 mg/dL — ABNORMAL HIGH (ref 0.3–1.2)
Total Protein: 4.1 g/dL — ABNORMAL LOW (ref 6.5–8.1)

## 2019-03-13 LAB — BPAM FFP
Blood Product Expiration Date: 202101202359
Blood Product Expiration Date: 202101202359
Blood Product Expiration Date: 202101202359
Blood Product Expiration Date: 202101202359
ISSUE DATE / TIME: 202101151655
ISSUE DATE / TIME: 202101151655
ISSUE DATE / TIME: 202101151655
ISSUE DATE / TIME: 202101151655
Unit Type and Rh: 600
Unit Type and Rh: 6200
Unit Type and Rh: 6200
Unit Type and Rh: 6200

## 2019-03-13 LAB — PROTIME-INR
INR: 2.7 — ABNORMAL HIGH (ref 0.8–1.2)
Prothrombin Time: 28.2 seconds — ABNORMAL HIGH (ref 11.4–15.2)

## 2019-03-13 LAB — APTT: aPTT: 89 seconds — ABNORMAL HIGH (ref 24–36)

## 2019-03-13 LAB — GLUCOSE, CAPILLARY
Glucose-Capillary: 57 mg/dL — ABNORMAL LOW (ref 70–99)
Glucose-Capillary: 77 mg/dL (ref 70–99)
Glucose-Capillary: 36 mg/dL — CL (ref 70–99)
Glucose-Capillary: 107 mg/dL — ABNORMAL HIGH (ref 70–99)

## 2019-03-13 LAB — LACTIC ACID, PLASMA: Lactic Acid, Venous: >11 mmol/L (ref 0.5–1.9)

## 2019-03-13 MED ORDER — DEXTROSE 50 % IV SOLN: 25.0000 mL | Freq: Once | INTRAVENOUS | Status: AC

## 2019-03-13 MED ORDER — SODIUM BICARBONATE 8.4 % IV SOLN
50.0000 meq | Freq: Once | INTRAVENOUS | Status: AC
  Administered 2019-03-13: 50 meq via INTRAVENOUS
  Filled 2019-03-13: qty 50

## 2019-03-13 MED ORDER — CALCIUM GLUCONATE-NACL 2-0.675 GM/100ML-% IV SOLN
2.0000 g | Freq: Once | INTRAVENOUS | Status: AC
  Administered 2019-03-13: 2000 mg via INTRAVENOUS
  Filled 2019-03-13: qty 100

## 2019-03-13 MED ORDER — DEXTROSE 50 % IV SOLN
INTRAVENOUS | Status: AC
  Administered 2019-03-13: 25 mL
  Filled 2019-03-13: qty 50

## 2019-03-13 MED ORDER — SODIUM BICARBONATE 8.4 % IV SOLN
INTRAVENOUS | Status: AC
  Administered 2019-03-13: 100 meq
  Filled 2019-03-13: qty 100

## 2019-03-13 MED ORDER — DEXTROSE 50 % IV SOLN
INTRAVENOUS | Status: AC
  Filled 2019-03-13: qty 50

## 2019-03-13 MED ORDER — DEXTROSE 50 % IV SOLN
50.0000 mL | Freq: Once | INTRAVENOUS | Status: AC
  Administered 2019-03-13: 50 mL via INTRAVENOUS

## 2019-03-13 MED ORDER — SODIUM BICARBONATE 8.4 % IV SOLN: 100.0000 meq | Freq: Once | INTRAVENOUS | Status: AC

## 2019-03-13 MED ORDER — ALBUMIN HUMAN 25 % IV SOLN
50.0000 g | Freq: Once | INTRAVENOUS | Status: AC
  Administered 2019-03-13: 50 g via INTRAVENOUS

## 2019-03-14 LAB — TYPE AND SCREEN
ABO/RH(D): A NEG
Antibody Screen: NEGATIVE
Unit division: 0
Unit division: 0
Unit division: 0
Unit division: 0
Unit division: 0
Unit division: 0
Unit division: 0
Unit division: 0
Unit division: 0
Unit division: 0
Unit division: 0
Unit division: 0
Unit division: 0
Unit division: 0

## 2019-03-14 LAB — BPAM RBC
Blood Product Expiration Date: 202101162359
Blood Product Expiration Date: 202101242359
Blood Product Expiration Date: 202101272359
Blood Product Expiration Date: 202101282359
Blood Product Expiration Date: 202101292359
Blood Product Expiration Date: 202102042359
Blood Product Expiration Date: 202102062359
Blood Product Expiration Date: 202102062359
Blood Product Expiration Date: 202102062359
Blood Product Expiration Date: 202102062359
Blood Product Expiration Date: 202102062359
Blood Product Expiration Date: 202102082359
Blood Product Expiration Date: 202102082359
Blood Product Expiration Date: 202102092359
ISSUE DATE / TIME: 202101131452
ISSUE DATE / TIME: 202101131452
ISSUE DATE / TIME: 202101131452
ISSUE DATE / TIME: 202101131452
ISSUE DATE / TIME: 202101132308
ISSUE DATE / TIME: 202101132308
ISSUE DATE / TIME: 202101140811
ISSUE DATE / TIME: 202101142202
ISSUE DATE / TIME: 202101151136
ISSUE DATE / TIME: 202101151542
ISSUE DATE / TIME: 202101151542
ISSUE DATE / TIME: 202101151542
ISSUE DATE / TIME: 202101151652
Unit Type and Rh: 600
Unit Type and Rh: 600
Unit Type and Rh: 600
Unit Type and Rh: 600
Unit Type and Rh: 600
Unit Type and Rh: 600
Unit Type and Rh: 600
Unit Type and Rh: 600
Unit Type and Rh: 600
Unit Type and Rh: 600
Unit Type and Rh: 600
Unit Type and Rh: 600
Unit Type and Rh: 600
Unit Type and Rh: 600

## 2019-03-29 NOTE — Progress Notes (Signed)
      [address]       [place] [address]             [phone]     Patient remains critically ill, family meeting last night with son and daughter.  Patient was made a DO NOT RESUSCITATE. Further discussion with the family this morning, including siblings are out of town and out of the country.  The family would like to withdraw care.  I discussed this process with them, with the patient's critical illness and dependence on pressors, we will have terminal wean on pressors.  Family is agreeable with this approach.    Grace Isaac MD [place] [phone] Office [phone] 04-09-2019 9:16 AM

## 2019-03-29 NOTE — Progress Notes (Signed)
Progress Note  Patient Name: Robert [name]. Date of Encounter: 2019-04-05  Primary Cardiologist: Shirlee More, MD   Subjective   Unresponsive, despite minimal sedation.   Bradycardic, atrial fibrillation with slow ventricular response. Hypotensive, despite being on high doses of 4 pressors. In atrial fibrillation with slow ventricular response around 45-55 bpm. On CVVH.  Severe metabolic acidosis.  Mildly hypoxic despite FiO2 1.0.  Inpatient Medications    Scheduled Meds: . sodium chloride   Intravenous Once  . sodium chloride   Intravenous Once  . chlorhexidine gluconate (MEDLINE KIT)  15 mL Mouth Rinse BID  . Chlorhexidine Gluconate Cloth  6 each Topical Daily  . darbepoetin (ARANESP) injection - DIALYSIS  60 mcg Subcutaneous Q Tue  . dextrose  50 mL Intravenous Once  . dextrose      . doxercalciferol  4 mcg Intravenous Once per day on Tue Thu Sat  . feeding supplement (PRO-STAT SUGAR FREE 64)  30 mL Per Tube BID  . hydrocortisone sod succinate (SOLU-CORTEF) inj  50 mg Intravenous Q6H  . levothyroxine  25 mcg Intravenous Daily  . mouth rinse  15 mL Mouth Rinse 10 times per day  . pantoprazole (PROTONIX) IV  40 mg Intravenous Q24H  . sodium chloride flush  10-40 mL Intracatheter Q12H  . sodium chloride flush  3 mL Intravenous Q12H   Continuous Infusions: . sodium chloride 20 mL/hr at 03/28/2019 1730  . amiodarone 30 mg/hr (04/05/19 0700)  . anidulafungin Stopped (03/16/2019 2304)  . ceFEPime (MAXIPIME) IV Stopped (2019/04/05 0050)  . dexmedetomidine (PRECEDEX) IV infusion 0.5 mcg/kg/hr (03/03/2019 1541)  . epinephrine 20 mcg/min (04/05/2019 0800)  . fentaNYL infusion INTRAVENOUS 25 mcg/hr (04/05/2019 0700)  . metronidazole 500 mg (2019/04/05 0806)  . norepinephrine (LEVOPHED) Adult infusion 50 mcg/min (04/05/2019 0700)  . phenylephrine (NEO-SYNEPHRINE) Adult infusion 400 mcg/min (April 05, 2019 0811)  . prismasol BGK 4/2.5 1,700 mL/hr at April 05, 2019 0557  . sodium bicarbonate 150 mEq  in dextrose 5% 1000 mL 150 mEq (05-Apr-2019 0810)  . sodium bicarbonate (isotonic) 1000 mL infusion 125 mL/hr at April 05, 2019 0258  . sodium bicarbonate (isotonic) 1000 mL infusion 125 mL/hr at 04-05-2019 0615  . vancomycin    . vasopressin (PITRESSIN) infusion - *FOR SHOCK* 0.04 Units/min (04/05/19 0700)   PRN Meds: albuterol, alteplase, budesonide (PULMICORT) nebulizer solution, heparin, lidocaine (PF), metoprolol tartrate, midazolam, morphine injection, ondansetron (ZOFRAN) IV, sodium chloride, sodium chloride flush, sodium chloride flush   Vital Signs    Vitals:   04/05/19 0630 April 05, 2019 0645 Apr 05, 2019 0700 05-Apr-2019 0750  BP:   (!) 78/54 (!) 78/54  Pulse: (!) 59 64 (!) 52 (!) 54  Resp: (!) 24 (!) 24 (!) 24 (!) 24  Temp: (!) 97.2 F (36.2 C) (!) 97.2 F (36.2 C) (!) 97 F (36.1 C)   TempSrc:      SpO2: (!) 87% (!) 88% (!) 88% (!) 86%  Weight:      Height:        Intake/Output Summary (Last 24 hours) at 04-05-19 0816 Last data filed at 2019/04/05 0700 Gross per 24 hour  Intake 11825.72 ml  Output 6426 ml  Net 5399.72 ml   Last 3 Weights April 05, 2019 03/07/2019 03/08/2019  Weight (lbs) 225 lb 15.5 oz 214 lb 4.6 oz 164 lb 7.4 oz  Weight (kg) 102.5 kg 97.2 kg 74.6 kg      Telemetry    Atrial fibrillation with slow ventricular response- Personally Reviewed  ECG    No new tracing- Personally Reviewed  Physical Exam  Positive, extremities mottled and cyanotic Under warming blanket GEN:  Unresponsive to deep stimuli Neck:  Unable to evaluate JVD Cardiac:  Irregular, 2/6 aortic ejection murmur, no diastolic murmurs, rubs, or gallops.  Respiratory: Clear to auscultation bilaterally. GI: Soft, nontender, non-distended.  Colostomy bag in place.  Very rare bowel sounds MS:  Generalized edema edema; No deformity. Neuro:   Unable to evaluate Psych: Unable to evaluate  Labs    High Sensitivity Troponin:   Recent Labs  Lab 02/02/2019 1736 02/21/2019 1922 02/06/2019 2200 02/13/2019 0124  02/03/2019 0955  TROPONINIHS 81* 856* 4,043* 4,270* 4,018*      Chemistry Recent Labs  Lab 03/11/19 1555 03/11/19 1601 03/03/2019 0325 03/15/2019 0457 03/08/2019 0743 03/17/2019 1027 03/09/2019 1514 03/17/2019 1605 03/03/2019 2037 03/22/19 0456 2019-03-22 0511  NA 142   < > 139   < >  --    < > 149*   < > 136 137 131*  K 4.0   < > 4.2   < >  --    < > 4.8   < > 5.1 5.5* 5.2*  CL 91*   < > 91*  --   --   --  93*  --   --  89*  --   CO2 23   < > 25  --   --   --  22  --   --  11*  --   GLUCOSE 224*   < > 76  --   --   --  104*  --   --  62*  --   BUN 32*   < > 25*  --   --   --  20  --   --  16  --   CREATININE 2.77*   < > 2.28*  --   --   --  2.02*  --   --  1.50*  --   CALCIUM 6.8*   < > 6.3*  --   --   --  7.8*  --   --  6.5*  --   PROT 4.2*  --   --   --  4.2*  --   --   --   --  4.1*  --   ALBUMIN 2.0*   < > 2.1*   < > 2.1*  --  1.7*  --   --  2.1*  2.1*  --   AST 8,966*  --   --   --  6,571*  --   --   --   --  5,613*  --   ALT  --   --   --   --  1,338*  --   --   --   --  1,029*  --   ALKPHOS 285*  --   --   --  345*  --   --   --   --  466*  --   BILITOT 8.5*  --   --   --  10.0*  --   --   --   --  11.1*  --   GFRNONAA 21*   < > 26*  --   --   --  30*  --   --  43*  --   GFRAA 24*   < > 30*  --   --   --  35*  --   --  50*  --   ANIONGAP 28*   < > 23*  --   --   --  34*  --   --  37*  --    < > = values in this interval not displayed.     Hematology Recent Labs  Lab 03/19/2019 0325 03/03/2019 0457 02/27/2019 1427 03/20/2019 1452 03/23/2019 1514 03/25/2019 1605 03/22/2019 2037 11-Apr-2019 0456 Apr 11, 2019 0511  WBC 18.4*  --  20.0*  --   --   --   --  16.6*  --   RBC 2.85*  --  1.95*  --   --   --   --  2.87*  --   HGB 8.6*   < > 6.0*   < >  --    < > 9.9* 8.9* 7.8*  HCT 25.4*   < > 19.1*   < >  --    < > 29.0* 28.2* 23.0*  MCV 89.1  --  97.9  --   --   --   --  98.3  --   MCH 30.2  --  30.8  --   --   --   --  31.0  --   MCHC 33.9  --  31.4  --   --   --   --  31.6  --   RDW 17.6*  --   18.7*  --   --   --   --  17.9*  --   PLT 65*   < > 44*  --  36*  --   --  51*  --    < > = values in this interval not displayed.    BNPNo results for input(s): BNP, PROBNP in the last 168 hours.   DDimer  Recent Labs  Lab 03/11/19 0228 03/21/2019 1514  DDIMER 8.86* 15.63*     Radiology    DG Chest Port (Stat)  Result Date: 03/06/2019 CLINICAL DATA:  Intubation EXAM: PORTABLE CHEST 1 VIEW COMPARISON:  Two days ago FINDINGS: Endotracheal tube with tip just below the clavicular heads. The enteric tube at least reaches the stomach. Right subclavian line with tip at the SVC. Postoperative heart. Implantable loop recorder. Pulmonary opacity asymmetric to the left with hazy appearance at least partially from layering pleural fluid. Emphysema. No pneumothorax. IMPRESSION: Stable hardware positioning and asymmetric left-sided opacity where there is layering pleural fluid. Electronically Signed   By: Monte Fantasia M.D.   On: 03/05/2019 07:00    Cardiac Studies    Patient Profile     84 y.o. male with ESRD, HTN, CAD s/p PCI, Stroke s/p CEA, DM admitted 12/29 for new-onset Afib. Subsequently had NSTEMI and found to have 3vCAD. S/p CABG x 4 02/09/2019.  Note preoperative echocardiogram showed normal LV function, mild mitral regurgitation, moderate aortic stenosis. On post op day 14 developed acute respiratory failure, septic shock and bowel infarction requiring total colectomy.   Currently in acute septic shock with steady worsening of clinical status.  Assessment & Plan    Prognosis is grim. Severe pre-existing comorbidities (end-stage renal disease on hemodialysis, previous stroke, diabetes mellitus, extensive CAD, atrial fibrillation), complicated by septic shock status post ischemic bowel and total colectomy, now with evidence of shock liver, acute respiratory failure and circulatory collapse. DNR since yesterday.  Family considering withdrawal of care today. Odds of survival are close to  zero, meaningful quality of life even lower. I answered their questions to the best of my ability and provided support.   We will stop amiodarone due to bradycardia.  For questions or updates, please contact [facility] Please consult [email or web address] for  contact info under        Signed, Sanda Klein, MD  03/20/19, 8:16 AM

## 2019-03-29 NOTE — Progress Notes (Signed)
[facility] Progress Note Patient Name: Robert [name]. DOB: [birth year] MRN: [id number]   Date of Service  April 11, 2019  HPI/Events of Note  Profound metabolic acidosis and hypocalcemia, septic shock  eICU Interventions  Calcium gluconate 2 gm iv x 1, Sodium bicarbonate 100 meq iv x 1, Albumin 25 % 50 gm iv x 1        [name] U Aaliyan Brinkmeier 2019-04-11, 5:46 AM

## 2019-03-29 NOTE — Progress Notes (Signed)
Pharmacy Antibiotic Note  Robert [name]. is a 84 y.o. male admitted on 02/10/2019 initially  With new Afib, now spiking fevers.  Pharmacy has been consulted for cefepime dosing due to concern for pneumonia.  WBC is elevated at 16. Pt is afebrile. He continues on CRRT.   Plan: Cefepime 2g Q 12 hours with CRRT Vancomycin 750mg  q24 hours  Follow cultures, WBC, length of therapy, and clinical status Planning on checking vancomycin level 1/18 if continued  Height: 5\' 8"  (172.7 cm) Weight: 225 lb 15.5 oz (102.5 kg) IBW/kg (Calculated) : 68.4  Temp (24hrs), Avg:96.5 F (35.8 C), Min:95.5 F (35.3 C), Max:97.5 F (36.4 C)  Recent Labs  Lab 03/11/19 0228 03/11/19 0731 03/11/19 1200 03/11/19 1555 03/11/19 1944 03/11/19 2356 03/09/2019 0325 02/28/2019 0621 03/03/2019 1427 03/28/2019 1514 03/25/19 0456  WBC 14.9*  --    < >  --  15.5* 16.7* 18.4*  --  20.0*  --  16.6*  CREATININE 3.88*  --   --  2.77*  --   --  2.28*  --   --  2.02* 1.50*  LATICACIDVEN >11.0* >11.0*  --   --   --   --   --  >11.0*  --  >11.0* >11.0*   < > = values in this interval not displayed.    Estimated Creatinine Clearance: 44.8 mL/min (A) (by C-G formula based on SCr of 1.5 mg/dL (H)).    Allergies  Allergen Reactions  . Lisinopril Other (See Comments)    "made me lose my voice"     Thank you for allowing pharmacy to be a part of this patient's care.  Erin Hearing PharmD., BCPS Clinical Pharmacist 03/25/19 9:05 AM

## 2019-03-29 NOTE — Death Summary Note (Signed)
Physician Death Summary  Patient ID: Robert [name]. MRN: [id number] DOB/AGE: [birth year] 82 y.o.  Admit date: 02/12/2019 Patient Expired: 2019-04-04  Admission Diagnoses: Coronary artery disease Acute NSTEMI Atrial fibrillation with RVR Diabetes Mellitus, type 2, insulin dependent History of CVA Chronic Stage V Kidney Disease Hypertensive heart disease with heart failure    Diagnoses at time of death:   Septic shock secondary to infarcted colon Coronary artery disease Acute NSTEMI Atrial fibrillation with RVR Diabetes Mellitus, type 2, insulin dependent History of CVA Chronic Stage V Kidney Disease Hypertensive heart disease with heart failure S/P coronary artery bypass grafting x 4 S/P Exploratory laparotomy, subtotal colectomy Malnutrition of moderate degree Aspiration pneumonia Respiratory failure   History of Present Illness:     84 yo male with a history of end stage renal disease dialyzed T-TH-Sat, hypertension, type 2 insulin dependent diabetes, coronary artery disease s/p PTCI x 3, and history of CVA s/p CEA.  He was admitted to the hospital with afib with RVR.  He had a significant troponin leak, and thus underwent a LHC which showed severe LM/3V.   He denies any chest pain.    Hospital Course: CT surgery consultation was requested for consideration of coronary bypass grafting in light of his left main and multivessel coronary disease.  He remained stable following left heart catheterization.  He was evaluated by Dr. Kipp Brood and was felt to be an appropriate candidate for coronary artery bypass grafting.  The patient elected to proceed with surgery.  He was taken to the operating room on 02/04/2019 where four-vessel coronary bypass grafting was carried out along with placement of a clip to the left atrial appendage (45 mm atrial cure atrial clip).  Following the procedure, the patient was transferred to the cardiovascular ICU. He was bradycardic early  postoperatively.  A dobutamine infusion was initiated to increase his heart rate and improved hemodynamics. He was extubated on the morning of the first postoperative day. The nephrology service followed him closely postoperatively and managed dialysis and electrolytes as required. He remained in atrial fibrillation after surgery with RVR at times so IV diltiazem was added. He had some confusion for several days that gradually improved to his baseline mental status. He could not swallow well enough to maintain adequate oral intake so a CorTrak feeding tube was placed and tube feeding initiated per the nutritionist's recommendations. He made progress for the next few days then developed respiratory distress after an episode of vomiting on 03/09/19. He required re-intubation for mechanical vent support for hypercarbic respiratory failure with suspected associated sepsis. Cultures were obtained (blood and sputum) and empiric antibiotics were initiated.  The tube feeding was discontinued and the NG was placed to low wall suction.  Later in the day he was discovered to have abdominal pain. General surgery was consulted and the decision was made to proceed to the OR urgently for exploratory laparotomy. In the OR, the patient was discovered to have an infarcted colon. Subtotal colectomy was performed and a wound Vac was placed over the open abdominal wound. The patient was returned to the ICU where resuscitation efforts were continued that included IVF, blood transfusions, vasopressor support, and broad-spectrum antibiotics. He was returned to the OR on 03/26/2019 for a second look. There was no evidence of further bowel ischemia. An ileostomy was fashioned and the abdominal wound was closed. The patient was returned to the ICU and maximal support was continued but despite these efforts, he continued to deteriorate hemodynamically. His poor prognosis  was discussed with his family by Dr. Servando Snare on 03/14/2019 and the family  members decided to designate Robert [name] "Do Not Resuscitate".  On the following morning, the family decided to withdraw support and their wishes were honored. Robert [name] expired at approximately 10:22am on 26-Mar-2019 with his family and chaplain at the bedside.   Consults:   cardiology     nephrology    general surgery  Significant Diagnostic Studies:   LEFT HEART CATH AND CORONARY ANGIOGRAPHY  Conclusion    Dist LM to Prox LAD lesion is 90% stenosed.  Ost Cx to Prox Cx lesion is 95% stenosed.  Mid LAD lesion is 80% stenosed - at stent overlap site.  1st Diag-1 lesion is 55% stenosed. 1st Diag-2 lesion is 55% stenosed.  Prox RCA to Mid RCA - previously placed DES is 20% in-stent re-stenosed.  Dist RCA lesion is 70% stenosed proximal to bifucation.  LV end diastolic pressure is moderately elevated.  There is moderate aortic valve stenosis.   SUMMARY  Severe Calcified distal LM-Ostial LAD & LCX ([place] 1,1,1,) 90-95% stenosis  ~80% in-stent restenosis of LAD overlapping DES; ostial large D1 60% with prox ~60%.  Mild ~20% ISR of mRCA stent with distal RCA tubular 70%.  Calcified Aortic Valve with relatively low Gradient noted on Vavle pull-back gradient. - Suspect more Moderate to Mild-Moderate AS as opposed to Mod-Severe AS.   Good downstream LAD, D1, Cx-OM1 & rPDA targets.  RECOMMENDATIONS  Best Revascularization option is CABG - Dr. Kipp Brood from [facility] has been consulted -- need to check P2Y12 Inhibition assay & await Plavix washout (las dose Sunday 12/27)  Will transfer to CCU for closer monitoring - & to allow for HD to be done in the CCU  Closely monitor BP  Restart IV Heparin 8 hr after sheath removal.   Glenetta Hew, MD   Recommendations  Antiplatelet/Anticoag Recommend to resume Rivaroxaban, at currently prescribed dose and frequency. Concurrent antiplatelet therapy not recommended. Restart IV Heparin 8 hr after sheath removal - convert to DOAC on  d/c D/c Clopidogrel = may restart ASA 81 mg post Cath  Surgeon Notes    02/18/2019 2:36 PM Op Note signed by [name], MD  Indications  Non-ST elevation (NSTEMI) myocardial infarction ([place]) [I21.4 (ICD-10-CM)]  Atrial fibrillation with rapid ventricular response ([place]) [I48.91 (ICD-10-CM)]  CAD S/P percutaneous coronary angioplasty [I25.10, Z98.61 (ICD-10-CM)]  Procedural Details  Technical Details Primary Care Provider: Burnard Bunting, MD Primary Cardiologist: Shirlee More, MD  Referring Cardiologist: Dr. Frederic Jericho. is a 84 y.o. male with a hx of ESRD on HD, HTN, DM2, CAD s/p DES x 3 (Promus DES 3 x 18 in mRCA, overlapping Promus 2.5 x 18-Ion 2.5 x 12 DES in m-dLAD), stroke x2 (2017 and 2018) s/p loop recorder, carotid artery disease s/p right CEA 2018 who is being seen today for the evaluation of new onset afib RVR.  Had significant ST depressions while in Afib that persistent post conversion to NSR. He noted dyspnea, but no Chest Pain.  Hs Troponin checked - peaked @ 4000.  Given significant ++ Troponin, he is referred for cardiac catheterization).  Time Out: Verified patient identification, verified procedure, site/side was marked, verified correct patient position, special equipment/implants available, medications/allergies/relevent history reviewed, required imaging and test results available. Performed.  Access:  * RIGHT Common Femoral Artery: 5 Fr Sheath - fluoroscopically guided modified Seldinger Technique  -- Direct ultrasound guidance used.  Permanent image obtained and placed on chart.  Left Heart Catheterization: 5Fr Catheters advanced or exchanged over a J-wire under direct fluoroscopic guidance into the ascending aorta; JL4 catheter advanced first.  * LV Hemodynamics (LV Gram): Angled Pigtail Catheter * Left Coronary Artery Cineangiography: JL4 Catheter  * Right Coronary Artery Cineangiography: JR4 Catheter   Review of initial angiography  revealed: Severe dLM-ostialLAD-ostialLCx (1,1,1) 90-95% calcified stenosis along with stent 80% ISR in mLAD.  Preparations are made for Urgent CVTS Consultation.   Upon completion of Angiogaphy, the catheter was removed completely out of the body over a wire, without complication.  Femoral Sheath was removed in the Cath Lab with manual pressure for hemostasis.   MEDICATIONS SQ Lidocaine 18 mL  Estimated blood loss <50 mL.   During this procedure medications were administered to achieve and maintain moderate conscious sedation while the patient's heart rate, blood pressure, and oxygen saturation were continuously monitored and I was present face-to-face 100% of this time.  Medications (Filter: Administrations occurring from 02/08/2019 1530 to 02/18/2019 1640) (important)  Continuous medications are totaled by the amount administered until 02/01/2019 1640.  Heparin (Porcine) in NaCl 1000-0.9 UT/500ML-% SOLN (mL) Total volume:  1,000 mL Date/Time  Rate/Dose/Volume Action  02/16/2019 1549  500 mL Given  1549  500 mL Given    midazolam (VERSED) injection (mg) Total dose:  1 mg Date/Time  Rate/Dose/Volume Action  02/08/2019 1553  1 mg Given    fentaNYL (SUBLIMAZE) injection (mcg) Total dose:  25 mcg Date/Time  Rate/Dose/Volume Action  02/04/2019 1553  25 mcg Given    lidocaine (PF) (XYLOCAINE) 1 % injection (mL) Total volume:  15 mL Date/Time  Rate/Dose/Volume Action  02/16/2019 1553  15 mL Given    hydrALAZINE (APRESOLINE) injection (mg) Total dose:  10 mg Date/Time  Rate/Dose/Volume Action  01/31/2019 1615  10 mg Given    insulin aspart (novoLOG) injection 0-9 Units (Units) Total dose:  Cannot be calculated* *Administration dose not documented Date/Time  Rate/Dose/Volume Action  02/12/2019 1600  *Not included in total Automatically Held    Sedation Time  Sedation Time Physician-1: 26 minutes 42 seconds  Radiation/Fluoro  Fluoro time: 3.4 (min) DAP: 10901 (mGycm2) Cumulative Air  Kerma: 893 (mGy)  Complications  Complications documented before study signed (02/25/2019 8:10 AM)   No complications were associated with this study.  Documented by Leonie Man, MD - 01/27/2019 4:44 PM    Coronary Findings  Diagnostic Dominance: Right Left Main  Vessel was injected. Vessel is large.  Dist LM to Prox LAD lesion 90% stenosed  Dist LM to Prox LAD lesion is 90% stenosed. The lesion is located at the bifurcation, focal and eccentric. The lesion is severely calcified.  Left Anterior Descending  Mid LAD lesion 80% stenosed  Mid LAD lesion is 80% stenosed. The lesion is eccentric. focal The lesion was previously treated using a drug eluting stent over 2 years ago. Overlapping DES Previously placed stent displays restenosis.  First Diagonal Branch  1st Diag-1 lesion 55% stenosed  1st Diag-1 lesion is 55% stenosed. The lesion is eccentric.  1st Diag-2 lesion 55% stenosed  1st Diag-2 lesion is 55% stenosed. The lesion is segmental, eccentric and smooth.  Second Diagonal Branch  Vessel is small in size.  Left Circumflex  Vessel is large.  Ost Cx to Prox Cx lesion 95% stenosed  Ost Cx to Prox Cx lesion is 95% stenosed. The lesion is located at the bifurcation, focal, eccentric and irregular. The lesion is calcified.  First Obtuse Marginal Branch  Vessel is large  in size. Vessel is angiographically normal.  First Left Posterolateral Branch  Vessel is small in size.  Second Left Posterolateral Branch  Mod-Large in size Vessel is angiographically normal.  Left Posterior Atrioventricular Artery  Vessel is moderate in size.  Right Coronary Artery  Prox RCA to Mid RCA lesion 20% stenosed  Prox RCA to Mid RCA lesion is 20% stenosed. The lesion was previously treated using a drug eluting stent over 2 years ago. 3.0 mm x 18 mm DES Previously placed stent displays restenosis.  Dist RCA lesion 70% stenosed  Dist RCA lesion is 70% stenosed. The lesion is segmental,  concentric and smooth.  Acute Marginal Branch  Vessel is small in size.  Right Ventricular Branch  Vessel is small in size.  Right Posterior Atrioventricular Artery  Vessel is small in size.  Intervention  No interventions have been documented. Wall Motion  Resting    No LV Gram        Left Heart  Left Ventricle LV end diastolic pressure is moderately elevated.  Aortic Valve There is moderate aortic valve stenosis. The aortic valve is calcified. By Echo - however, by cath only mild-moderate with peak gradient ~10-15 mmHg.  Coronary Diagrams  Diagnostic Dominance: Right  I    ECHOCARDIOGRAM COMPLETE Order #: [id number] Accession #: [id number] Patient Info  Patient name: Robert [name].  MRN: [id number]  Age: 84 y.o.  Sex: male  MyChart Results Release  MyChart Status: Active Results Release  Vitals  BP Height Weight BSA (Calculated - sq m)  112/53 5\' 8"  (1.727 m) 80.2 kg 1.88 sq meters  Study Result  Result status: Final result    ECHOCARDIOGRAM REPORT       Patient Name:   Robert [name]. Date of Exam: 02/19/2019 Medical Rec #:  [id number]            Height:       68.0 in Accession #:    [id number]           Weight:       162.5 lb Date of Birth:  [birth year]            BSA:          1.87 m Patient Age:    56 years             BP:           145/70 mmHg Patient Gender: M                    HR:           62 bpm. Exam Location:  Inpatient  Procedure: 2D Echo  Indications:    Atrial Fibrillation 427.31 / I48.91   History:        Patient has prior history of Echocardiogram examinations, most                 recent 06/23/2018. CHF, CAD; Risk Factors:Diabetes, Hypertension                 and Dyslipidemia. CKD                 ESRD.   Sonographer:    Vikki Ports Turrentine Referring Phys: [place]    1. Left ventricular ejection fraction, by visual estimation, is 60 to 65%. The left ventricle has normal function.  There is no left ventricular hypertrophy.  2. Left ventricular diastolic parameters are consistent with  Grade II diastolic dysfunction (pseudonormalization).  3. The left ventricle has no regional wall motion abnormalities.  4. Elevated LVEDP.  5. Global right ventricle has normal systolic function.The right ventricular size is normal. No increase in right ventricular wall thickness.  6. Left atrial size was severely dilated.  7. Right atrial size was moderately dilated.  8. The mitral valve is normal in structure. Mild mitral valve regurgitation. No evidence of mitral stenosis.  9. The tricuspid valve is normal in structure. 10. The aortic valve is normal in structure. Aortic valve regurgitation is not visualized. Moderate aortic valve stenosis. 11. There is severe calcifcation of the aortic valve. 12. There is severe thickening of the aortic valve. 13. The pulmonic valve was normal in structure. Pulmonic valve regurgitation is trivial. 14. Normal pulmonary artery systolic pressure. 15. The inferior vena cava is dilated in size with <50% respiratory variability, suggesting right atrial pressure of 15 mmHg.  FINDINGS  Left Ventricle: Left ventricular ejection fraction, by visual estimation, is 60 to 65%. The left ventricle has normal function. The left ventricle has no regional wall motion abnormalities. The left ventricular internal cavity size was the left  ventricle is normal in size. There is no left ventricular hypertrophy. Left ventricular diastolic parameters are consistent with Grade II diastolic dysfunction (pseudonormalization). Normal left atrial pressure. Elevated LVEDP.  Right Ventricle: The right ventricular size is normal. No increase in right ventricular wall thickness. Global RV systolic function is has normal systolic function. The tricuspid regurgitant velocity is 1.57 m/s, and with an assumed right atrial pressure  of 15 mmHg, the estimated right ventricular systolic  pressure is normal at 24.9 mmHg.  Left Atrium: Left atrial size was severely dilated.  Right Atrium: Right atrial size was moderately dilated  Pericardium: There is no evidence of pericardial effusion.  Mitral Valve: The mitral valve is normal in structure. Mild mitral valve regurgitation. No evidence of mitral valve stenosis by observation.  Tricuspid Valve: The tricuspid valve is normal in structure. Tricuspid valve regurgitation is mild.  Aortic Valve: The aortic valve is normal in structure.. There is severe thickening and severe calcifcation of the aortic valve. Aortic valve regurgitation is not visualized. Moderate aortic stenosis is present. There is severe thickening of the aortic  valve. There is severe calcifcation of the aortic valve. Aortic valve mean gradient measures 16.2 mmHg. Aortic valve peak gradient measures 26.3 mmHg. Aortic valve area, by VTI measures 0.94 cm.  Pulmonic Valve: The pulmonic valve was normal in structure. Pulmonic valve regurgitation is trivial. Pulmonic regurgitation is trivial.  Aorta: The aortic root, ascending aorta and aortic arch are all structurally normal, with no evidence of dilitation or obstruction.  Venous: The inferior vena cava is dilated in size with less than 50% respiratory variability, suggesting right atrial pressure of 15 mmHg.  IAS/Shunts: No atrial level shunt detected by color flow Doppler. There is no evidence of a patent foramen ovale. No ventricular septal defect is seen or detected. There is no evidence of an atrial septal defect.  Additional Comments: Aortic valve stenosis is mild by mean gradient. However, visually it is severely calciffied and restricted. By planimetry, aortic valve area is 1.23 cm^2, consistent with moderate aortic stenosis.   LEFT VENTRICLE PLAX 2D LVIDd:         5.04 cm  Diastology LVIDs:         3.21 cm  LV e' lateral:   8.43 cm/s LV PW:         0.90  cm  LV E/e' lateral: 19.8 LV IVS:         0.93 cm  LV e' medial:    6.08 cm/s LVOT diam:     1.90 cm  LV E/e' medial:  27.5 LV SV:         79 ml LV SV Index:   41.92 LVOT Area:     2.84 cm    RIGHT VENTRICLE RV S prime:     11.80 cm/s TAPSE (M-mode): 2.4 cm  LEFT ATRIUM              Index       RIGHT ATRIUM           Index LA diam:        3.70 cm  1.98 cm/m  RA Area:     22.00 cm LA Vol (A2C):   105.0 ml 56.12 ml/m RA Volume:   67.00 ml  35.81 ml/m LA Vol (A4C):   101.0 ml 53.98 ml/m LA Biplane Vol: 104.0 ml 55.58 ml/m  AORTIC VALVE AV Area (Vmax):    1.05 cm AV Area (Vmean):   0.97 cm AV Area (VTI):     0.94 cm AV Vmax:           256.60 cm/s AV Vmean:          192.800 cm/s AV VTI:            0.651 m AV Peak Grad:      26.3 mmHg AV Mean Grad:      16.2 mmHg LVOT Vmax:         95.40 cm/s LVOT Vmean:        66.300 cm/s LVOT VTI:          0.217 m LVOT/AV VTI ratio: 0.33   AORTA Ao Root diam: 3.20 cm  MITRAL VALVE                         TRICUSPID VALVE MV Area (PHT): 3.17 cm              TR Peak grad:   9.9 mmHg MV PHT:        69.31 msec            TR Vmax:        157.00 cm/s MV Decel Time: 239 msec MV E velocity: 167.00 cm/s 103 cm/s  SHUNTS MV A velocity: 124.00 cm/s 70.3 cm/s Systemic VTI:  0.22 m MV E/A ratio:  1.35        1.5       Systemic Diam: 1.90 cm    Skeet Latch MD Electronically signed by Skeet Latch MD Signature Date/Time: 01/26/2019/3:13:46 PM         Treatments:   01/26/2019 Patient:  Robert [name]. Pre-Op Dx: LM/3VCAD                         NSTEMI                         ESRD   Post-op Dx:  Same Procedure: CABG X 4.  LIMA-LAD, RSVG OM1, PDA, D1   4mm Atriclip placement Endoscopic greater saphenous vein harvest on the right leg, and left thight Intra-operative Transesophageal Echocardiogram  Surgeon and Role:      * Lightfoot, Lucile Crater, MD - Primary    Assistant: Josie Saunders, PA-C  Anesthesia  general EBL:  500 ml Blood Administration:  3units pRBCs, 1 unit plts Xclamp Time:  94 min Pump Time:  184 min  Drains: 19 F blake drain: L, mediastinal  Wires: V wires Counts: correct   Indications: 84 yo male with hx of ESRD is admitted to the hospital with afib with RVR. He had a significant troponin leak, and thus underwent a LHC which showed severe LM/3V. Findings: Significant pleural adhesions.  Good conduit.  Calcified targets.  Intramyocardial OM, and LAD.  Good flows on vein graft.     Signed         Show:Clear all [x] Manual[x] Template[] Copied  Added by: [x] Blackman, Douglas, MD  [] Hover for details EXPLORATORY LAPAROTOMY WITH CLOSURE OF ABDOMEN, ILEOSTOMY  Procedure Note  Robert [name]. 02/18/2019 - 03/16/2019   Pre-op Diagnosis: Open Abdomen, s/p subtotal colectomy     Post-op Diagnosis: same  Procedure(s): EXPLORATORY LAPAROTOMY WITH CLOSURE OF ABDOMEN ILEOSTOMY  Surgeon(s): 03/25/2019, MD  Anesthesia: General  Staff:  Circulator: Coralie Keens, RN Physician Assistant: Rosanne Sack, PA-C Scrub Person: Vivia Ewing, RN Circulator Assistant: Brien Mates, RN  Estimated Blood Loss: Minimal               Findings: There was no evidence of further bowel ischemia.  The small bowel appeared viable.  There was old hematoma in the abdomen which was washed out.  There was no active bleeding.  Procedure: The patient was brought intubated from the intensive care unit to the operating room.  He is placed upon the operating table and general anesthesia was induced.  We then removed outer portion of the wound VAC.  His abdomen was then prepped and draped in usual sterile fashion.  We then removed the rest of the wound VAC itself from the abdominal cavity.  The small bowel was eviscerated.  There was a moderate amount of hematoma in the left lower quadrant and pelvis.  This was evacuated along with old blood.  We then irrigated the abdomen with  multiple liters of normal saline.  No active bleeding was identified.  We then ran the small bowel from the ligament of Treitz to the resected segment.  The small bowel appeared viable throughout.  At this point we made a circular incision in the patient's right lower quadrant.  We took this down to the fascia which was then opened in a cruciate fashion.  The underlying muscle fibers were retracted the peritoneum was opened.  We took down some mesentery of the distalmost small bowel at the resection site with the LigaSure.  We then pulled the small bowel of the separate incision as an ileostomy.  The patient's midline fascia was then closed with a running #1 looped PDS suture.  Pulmonary pressures.  Stay the same after closure.  We then resected the distal end of the ileum and then matured the ileostomy circumferentially with interrupted 3-0 Vicryl sutures.  The ileostomy appeared pink and well perfused.  The midline wound was then packed with a wet-to-dry saline soaked Kerlix.  Dry gauze was then placed over top of this with ABDs.  The patient appeared to tolerate the procedure.  All counts were correct at the end of the procedure.  He was then taken, still intubated, back to the intensive care unit.          Cyd Silence   Date: 03/24/2019  Time: 11:40          EXPLORATORY LAPAROTOMY, SUBTOTAL  COLECTOMY, APPLICATION OF WOUND VAC  Procedure Note  Robert [name]. 03/02/2019   Pre-op Diagnosis: acute abdomen     Post-op Diagnosis: Infarcted colon  Procedure(s): EXPLORATORY LAPAROTOMY SUBTOTAL COLECTOMY APPLICATION OF WOUND VAC  Surgeon(s): Coralie Keens, MD  Anesthesia: General  Staff:  Circulator: Rosanne Sack, RN; Hitchcock, Delight Stare, RN Physician Assistant: Wellington Hampshire, PA-C Scrub Person: Rozell Searing, RN Circulator Assistant: Gleason, Westly Pam, RN; Celene Squibb, RN  Estimated Blood Loss: 300 mL               Specimens: sent to  path  Indications: This is an 84 year old gentleman with multiple medical problems on hemodialysis who is status post a CABG on 12/30.  Earlier today, he developed a worsening respiratory status and had to be emergently intubated.  He then became hypotensive and was on multiple pressors.  Lactic acid level was elevated greater than 7.  He appeared to have an acutely tender abdomen on physical examination so decision was made to proceed to the operating room for an emergent exploration.  Findings: Patient had an ischemic and infarcted colon from the cecum to the rectum.  There were also signs of previous diverticulitis and thickening of the distal sigmoid colon and rectum.  The small bowel appeared normal.  Procedure: The patient was brought to the operating room and identifies correct patient.  He was placed upon the operating table general anesthesia was induced.  His abdomen was then prepped and draped in usual sterile fashion.  I created midline incision with a scalpel.  I took this down through the subcutaneous tissue and fascia as well as peritoneum the entire length of the incision.  Upon entering the abdomen the small bowel is eviscerated.  The small bowel appeared pink and viable.  His entire colon, however, appeared ischemic with areas of gross infarction especially the distal transverse colon.  At this point, the decision was made to proceed with a subtotal colectomy.  The sigmoid colon and rectum were thickened.  He appears to have had previous episodes of diverticulitis.  I took down several adhesions with the electrocautery.  I then was able to come around the distal rectum and then transected it with the contour stapler.  I then mobilized the left colon along the white line of [place] with the cautery and took down the splenic flexure as well.  I then transected the distal small bowel with a GIA-75 stapler and mobilized the cecum and right colon along the white line of [place]t as well.  I then  took down the hepatic flexure.  Once I had the colon completely mobilized, I took down the entire mesentery with the LigaSure device.  I had to suture ligate several vessels in the mesentery with 2-0 silk sutures.  Once the entire specimen was removed, it was then sent to pathology for evaluation.  We then copiously irrigated the abdomen with several liters normal saline.  We appeared achieved hemostasis with the cautery, LigaSure, and silk sutures.  I did place a large piece of Surgicel at the rectal stump where there was some oozing.  At this point the decision was made to leave the abdomen open.  The large wound VAC device was brought to the field.  I placed it into the abdomen and into all 4 quadrants.  I then placed 2 sponges over the top of this.  It was then secured in place with adhesive.  I then made a small incision in this and  placed the suction tubing to the overlying sponge.  This was then placed to the [facility] device which was then tested.  A good seal appeared to be achieved.  At this point, the patient remained in critical condition.  All counts were correct at the end of the procedure.  The patient was then taken, still intubated, back to the intensive care unit.          Coralie Keens     Signed: Antony Odea, PA-C 03/22/2019, 2:56 PM

## 2019-03-29 NOTE — Progress Notes (Signed)
Chaplain responded to request to be present with family during [place].  Briefly met some family members.  Pt passed quickly.  Pt's nephew provided prayer.  Available as needed to provide support.  Luana Shu [phone]    March 28, 2019 0900  Clinical Encounter Type  Visited With Family  Visit Type Patient actively dying  Referral From Nurse  Consult/Referral To Chaplain  Spiritual Encounters  Spiritual Needs Grief support  Stress Factors  Family Stress Factors Major life changes

## 2019-03-29 NOTE — Progress Notes (Signed)
Patient asystolic on monitor.  Vasopressors stopped, per MD order.  2 RNs auscultated no heart tones for 1 minute; [place].  TOD 09:43, Dr. Servando Snare notified. Family at bedside, chaplain present.  225 cc of fentanyl wasted in stericycle, witnessed by [facility].

## 2019-03-29 NOTE — Progress Notes (Signed)
Assisted tele visit to patient with family members.  Parminder Cupples Ann, RN  

## 2019-03-29 NOTE — Progress Notes (Signed)
Hypoglycemic Event  CBG: 57  Treatment: 1/2amp D50  Symptoms: n/a intubated & sedated  Follow-up CBG: 77  Possible Reasons for Event: critically ill- multi system organ failure  Comments/MD notified:n/a    Grizelda Piscopo L Amador Braddy

## 2019-03-29 NOTE — Consult Note (Signed)
PULMONARY / CRITICAL CARE MEDICINE   NAME:  Robert [name]., MRN:  [id number], DOB:  1935-09-26, LOS: 3 ADMISSION DATE:  01/30/2019, CONSULTATION DATE:  03/25/2019 REFERRING MD: Benedetto Coons ,MD CHIEF COMPLAINT:  Acute hypoxic respiratory failure BRIEF HISTORY:    84 yo male presented with tachycardia from A fib with RVR and elevated troponin.  S/p CABG with atrial appendage clipping.  Developed ischemic bowel and required emergent laparotomy, and aspiration pneumonitis with hypoxia.  SIGNIFICANT PAST MEDICAL HISTORY   Allergy - year round Cancer ([place])     Comment:  PSA ELEVATED ESRD (end stage renal disease) ([place])     Comment:  [place] GERD (gastroesophageal reflux disease) Hearing deficit  History of kidney stones Hyperlipidemia Hypertension Hypothyroidism 2011: Myocardial infarction ([place]) No date: Stroke (cerebrum) ([place])     Comment:  weakness in left hand  04/2015, 05/20/16 No date: Thyroid disease     Comment:  hypothyroid  SIGNIFICANT EVENTS:  12/30 3 Vessel CABG - SVC to Diag1; SVC to OM1; SVC to PDA and LIMA to LAD. (N/A) Clipping Of Atrial Appendage using 45 MM AtriCure AtriClip (N/A)  1/13 - Emergent surgery for ischemic colon  1/15 - relook lap, ileostomy, abdominal closure  1/15 - Family discussion with CTS, Family decision to make patient DNR in setting of multisystem Organ failure  1/16 - Staph Epi on blood cultures, sensitive to Vanc  STUDIES:     CULTURES:  12/28 [place] Coronavirus 2 : Negative 12/30 MRSA nasopharyngeal : Negative 1/13 Blood cultures, updated 1/16: Staph Epidermidis, see susceptibilities  1/13 Sputum cultures: Gram positive rods on gram stain  ANTIBIOTICS:  Cefepime 1/12 >> Vancomycin 1/13 >> Flagyl 1/13 >> Anidulagungin 1/13 >>   LINES/TUBES:  1/10 Rt [place] CVL >> 1/13 ETT >>  CONSULTANTS:  Nephrology CTS Gen Surg  SUBJECTIVE:  Remains on pressors, vent, CRRT, sedation.  Family bedside OBJECTIVE:    CONSTITUTIONAL: BP (!) 71/59   Pulse (!) 57   Temp (!) 97.3 F (36.3 C)   Resp (!) 24   Ht 5\' 8"  (1.727 m)   Wt 102.5 kg   SpO2 (!) 87%   BMI 34.36 kg/m   I/O last 3 completed shifts: In: 18824.7 [I.V.:11307.1; Blood:5730.8; IV Piggyback:1786.9] Out: 9520 [Emesis/NG output:50; Drains:1710; Other:7710; Blood:50]     Vent Mode: PRVC FiO2 (%):  [100 %] 100 % Set Rate:  [22 bmp-24 bmp] 24 bmp Vt Set:  [650 mL] 650 mL PEEP:  [12 cmH20] 12 cmH20 Plateau Pressure:  [27 cmH20-31 cmH20] 31 cmH20  PHYSICAL EXAM:  General - severly ill appearing,  Cardiac - regular rate/rhythm, no murmur Pulm - Bilateral breath sounds Abdomen - Bandages over abdominal incision, ostomy bag with bloody fluid Extremities - 1+ edema Skin - bruise in Lt thigh Neuro - RASS -5   RESOLVED PROBLEM LIST    ASSESSMENT AND PLAN   84 year old with HTN, DME, CAD s/p 3 stents, stroke s/p CEA, ESRD TTS HD, admitted for Afib with RVR and elevated troponin, underwent CABG and stay complicated by Asp pneumonia and then ischemic bowel disease requiring emergent colectomy. Now s/p ileostomy and closure. Persistent elevated lactic acid > 11, on multiple pressors. Family discussion on 1/15 and patient made DNR. Family Bedside this morning .   Acute hypoxic respiratory failure with aspiration pneumonitis. - 7.1 /40/65 on 100% FIO2 / 11 - full vent support - goal SpO2 > 90% - f/u CXR, ABG  Septic shock from aspiration pneumonitis, and  ischemic colitis. Ischemic bowel s/p subtotal colectomy, s/p ileostomy and closure Lactic acidosis. Relative adrenal insufficiency. - Lactic Acid > 11 - Staph epi in blood cx - continue broad spectrum ABx and antifungal coverage - pressors to keep MAP > 65 - continue hydrocortisone   CAD, A fib s/p CABG with atrial clipping. - per TCTS  ESRD. - CRRT  Elevated LFTs from shock. - f/u LFTs  Anemia of critical illness, blood loss Thrombocytopenia. - PRBC, FFP after  surgery. -  Hgb  8.9  - f/u CBC - transfuse for Hb < 7 or significant bleeding  Best Practice / Goals of Care / Disposition.   DVT PROPHYLAXIS: SCDs , Heparin SUP:  Protonix NUTRITION: NPO MOBILITY: Bed rest GOALS OF CARE: DNR DISPOSITION: ICU  LABS:   CMP Latest Ref Rng & Units 04-Apr-2019 04/04/2019 02/26/2019  Glucose 70 - 99 mg/dL - 62(L) -  BUN 8 - 23 mg/dL - 16 -  Creatinine 0.61 - 1.24 mg/dL - 1.50(H) -  Sodium 135 - 145 mmol/L 131(L) 137 136  Potassium 3.5 - 5.1 mmol/L 5.2(H) 5.5(H) 5.1  Chloride 98 - 111 mmol/L - 89(L) -  CO2 22 - 32 mmol/L - 11(L) -  Calcium 8.9 - 10.3 mg/dL - 6.5(L) -  Total Protein 6.5 - 8.1 g/dL - 4.1(L) -  Total Bilirubin 0.3 - 1.2 mg/dL - 11.1(H) -  Alkaline Phos 38 - 126 U/L - 466(H) -  AST 15 - 41 U/L - 5,613(H) -  ALT 0 - 44 U/L - 1,029(H) -    CBC Latest Ref Rng & Units 2019-04-04 04/04/2019 03/15/2019  WBC 4.0 - 10.5 K/uL - 16.6(H) -  Hemoglobin 13.0 - 17.0 g/dL 7.8(L) 8.9(L) 9.9(L)  Hematocrit 39.0 - 52.0 % 23.0(L) 28.2(L) 29.0(L)  Platelets 150 - 400 K/uL - 51(L) -    ABG    Component Value Date/Time   PHART 7.104 (LL) 04-04-19 0511   PCO2ART 40.2 04/04/19 0511   PO2ART 65.0 (L) 2019-04-04 0511   HCO3 12.7 (L) April 04, 2019 0511   TCO2 14 (L) April 04, 2019 0511   ACIDBASEDEF 16.0 (H) 2019/04/04 0511   O2SAT 86.0 2019/04/04 0511    CBG (last 3)  Recent Labs    02/28/2019 2312 2019/04/04 0303 Apr 04, 2019 0355  GLUCAP 96 [place]    Tamsen Snider, MD PGY1

## 2019-03-29 NOTE — Progress Notes (Addendum)
@  12 this RN called the daughter to inform of pt's continued BP drop and HR irregularity. I told the daughter that while I do not have a time frame for events that may transpire in the near future I just wanted to give her the option to be at the bedside.  Daughter en route to hospital. She reported that she lives 15-42mins away.

## 2019-03-29 NOTE — Progress Notes (Signed)
Paged Dr. Servando Snare w/ morning ABG. Verbal order to give x2 amp of bicarb and to call CCM to allow them to make any more decisions.

## 2019-03-29 NOTE — Progress Notes (Signed)
RT notified pt expired 05-Apr-2019 at 0943. Pt extubated by RN.

## 2019-03-29 DEATH — deceased
